# Patient Record
Sex: Female | Born: 1935 | Race: White | Hispanic: No | Marital: Married | Smoking: Never smoker
Health system: Southern US, Community
[De-identification: ages and names within clinical notes are randomized; demographics above are authoritative.]

## PROBLEM LIST (undated history)

## (undated) DIAGNOSIS — C50919 Malignant neoplasm of unspecified site of unspecified female breast: Secondary | ICD-10-CM

## (undated) DIAGNOSIS — M1990 Unspecified osteoarthritis, unspecified site: Secondary | ICD-10-CM

## (undated) DIAGNOSIS — C4490 Unspecified malignant neoplasm of skin, unspecified: Secondary | ICD-10-CM

## (undated) DIAGNOSIS — L570 Actinic keratosis: Secondary | ICD-10-CM

## (undated) DIAGNOSIS — I10 Essential (primary) hypertension: Secondary | ICD-10-CM

## (undated) DIAGNOSIS — S12000A Unspecified displaced fracture of first cervical vertebra, initial encounter for closed fracture: Secondary | ICD-10-CM

## (undated) HISTORY — PX: BACK SURGERY: SHX140

## (undated) HISTORY — DX: Actinic keratosis: L57.0

## (undated) HISTORY — PX: MOHS SURGERY: SUR867

---

## 2001-11-30 ENCOUNTER — Ambulatory Visit (HOSPITAL_COMMUNITY): Admission: RE | Admit: 2001-11-30 | Discharge: 2001-11-30 | Payer: Self-pay | Admitting: Gastroenterology

## 2004-10-29 ENCOUNTER — Ambulatory Visit: Payer: Self-pay | Admitting: Specialist

## 2004-11-06 ENCOUNTER — Ambulatory Visit (HOSPITAL_COMMUNITY): Admission: RE | Admit: 2004-11-06 | Discharge: 2004-11-06 | Payer: Self-pay | Admitting: Neurosurgery

## 2006-07-04 ENCOUNTER — Other Ambulatory Visit: Payer: Self-pay

## 2006-07-04 ENCOUNTER — Emergency Department: Payer: Self-pay | Admitting: Emergency Medicine

## 2006-08-30 ENCOUNTER — Emergency Department: Payer: Self-pay | Admitting: Emergency Medicine

## 2014-12-15 ENCOUNTER — Emergency Department
Admission: EM | Admit: 2014-12-15 | Discharge: 2014-12-15 | Disposition: A | Discharge status: Home / Self Care | Payer: Medicare Other | Attending: Emergency Medicine | Admitting: Emergency Medicine

## 2014-12-15 ENCOUNTER — Other Ambulatory Visit: Payer: Self-pay

## 2014-12-15 ENCOUNTER — Encounter: Payer: Self-pay | Admitting: Emergency Medicine

## 2014-12-15 ENCOUNTER — Emergency Department: Payer: Medicare Other

## 2014-12-15 ENCOUNTER — Emergency Department
Admission: EM | Admit: 2014-12-15 | Discharge: 2014-12-16 | Disposition: A | Discharge status: Home / Self Care | Payer: Medicare Other | Source: Home / Self Care | Attending: Emergency Medicine | Admitting: Emergency Medicine

## 2014-12-15 DIAGNOSIS — F0781 Postconcussional syndrome: Secondary | ICD-10-CM | POA: Diagnosis not present

## 2014-12-15 DIAGNOSIS — R4182 Altered mental status, unspecified: Secondary | ICD-10-CM | POA: Insufficient documentation

## 2014-12-15 DIAGNOSIS — I10 Essential (primary) hypertension: Secondary | ICD-10-CM | POA: Insufficient documentation

## 2014-12-15 DIAGNOSIS — Y9289 Other specified places as the place of occurrence of the external cause: Secondary | ICD-10-CM | POA: Insufficient documentation

## 2014-12-15 DIAGNOSIS — W01198A Fall on same level from slipping, tripping and stumbling with subsequent striking against other object, initial encounter: Secondary | ICD-10-CM | POA: Insufficient documentation

## 2014-12-15 DIAGNOSIS — G44309 Post-traumatic headache, unspecified, not intractable: Secondary | ICD-10-CM | POA: Insufficient documentation

## 2014-12-15 DIAGNOSIS — Y9301 Activity, walking, marching and hiking: Secondary | ICD-10-CM | POA: Insufficient documentation

## 2014-12-15 DIAGNOSIS — S0181XA Laceration without foreign body of other part of head, initial encounter: Secondary | ICD-10-CM | POA: Diagnosis present

## 2014-12-15 DIAGNOSIS — R413 Other amnesia: Secondary | ICD-10-CM

## 2014-12-15 DIAGNOSIS — Y998 Other external cause status: Secondary | ICD-10-CM | POA: Diagnosis not present

## 2014-12-15 DIAGNOSIS — N390 Urinary tract infection, site not specified: Secondary | ICD-10-CM | POA: Diagnosis not present

## 2014-12-15 HISTORY — DX: Unspecified malignant neoplasm of skin, unspecified: C44.90

## 2014-12-15 HISTORY — DX: Essential (primary) hypertension: I10

## 2014-12-15 LAB — CBC WITH DIFFERENTIAL/PLATELET
Basophils Absolute: 0.1 10*3/uL (ref 0–0.1)
Basophils Relative: 1 %
EOS ABS: 0.1 10*3/uL (ref 0–0.7)
Eosinophils Relative: 1 %
HEMATOCRIT: 44.9 % (ref 35.0–47.0)
HEMOGLOBIN: 15 g/dL (ref 12.0–16.0)
LYMPHS ABS: 3.5 10*3/uL (ref 1.0–3.6)
Lymphocytes Relative: 36 %
MCH: 29.5 pg (ref 26.0–34.0)
MCHC: 33.4 g/dL (ref 32.0–36.0)
MCV: 88.3 fL (ref 80.0–100.0)
MONO ABS: 0.7 10*3/uL (ref 0.2–0.9)
MONOS PCT: 8 %
NEUTROS PCT: 54 %
Neutro Abs: 5.4 10*3/uL (ref 1.4–6.5)
Platelets: 250 10*3/uL (ref 150–440)
RBC: 5.08 MIL/uL (ref 3.80–5.20)
RDW: 13.1 % (ref 11.5–14.5)
WBC: 9.8 10*3/uL (ref 3.6–11.0)

## 2014-12-15 LAB — COMPREHENSIVE METABOLIC PANEL
ALK PHOS: 61 U/L (ref 38–126)
ALT: 17 U/L (ref 14–54)
ANION GAP: 9 (ref 5–15)
AST: 27 U/L (ref 15–41)
Albumin: 4.5 g/dL (ref 3.5–5.0)
BILIRUBIN TOTAL: 1 mg/dL (ref 0.3–1.2)
BUN: 24 mg/dL — ABNORMAL HIGH (ref 6–20)
CALCIUM: 10 mg/dL (ref 8.9–10.3)
CO2: 32 mmol/L (ref 22–32)
Chloride: 99 mmol/L — ABNORMAL LOW (ref 101–111)
Creatinine, Ser: 0.56 mg/dL (ref 0.44–1.00)
GFR calc non Af Amer: >60 mL/min (ref >60)
Glucose, Bld: 109 mg/dL — ABNORMAL HIGH (ref 65–99)
POTASSIUM: 3.2 mmol/L — AB (ref 3.5–5.1)
SODIUM: 140 mmol/L (ref 135–145)
TOTAL PROTEIN: 7.7 g/dL (ref 6.5–8.1)

## 2014-12-15 LAB — GLUCOSE, CAPILLARY: GLUCOSE-CAPILLARY: 103 mg/dL — AB (ref 65–99)

## 2014-12-15 MED ORDER — LIDOCAINE-PRILOCAINE 2.5-2.5 % EX CREA
TOPICAL_CREAM | Freq: Once | CUTANEOUS | Status: AC
  Administered 2014-12-15: 19:00:00 via TOPICAL
  Filled 2014-12-15 (×2): qty 5

## 2014-12-15 MED ORDER — BACITRACIN ZINC 500 UNIT/GM EX OINT
TOPICAL_OINTMENT | CUTANEOUS | Status: AC
  Administered 2014-12-15: 1 via TOPICAL
  Filled 2014-12-15: qty 0.9

## 2014-12-15 MED ORDER — ACETAMINOPHEN 325 MG PO TABS
650.0000 mg | ORAL_TABLET | Freq: Once | ORAL | Status: AC
Start: 2014-12-16 — End: 2014-12-16
  Administered 2014-12-16: 650 mg via ORAL

## 2014-12-15 MED ORDER — LORAZEPAM 0.5 MG PO TABS
ORAL_TABLET | ORAL | Status: AC
  Administered 2014-12-16: 0.5 mg via ORAL
  Filled 2014-12-15: qty 1

## 2014-12-15 MED ORDER — LORAZEPAM 0.5 MG PO TABS
0.5000 mg | ORAL_TABLET | Freq: Once | ORAL | Status: AC
  Administered 2014-12-16: 0.5 mg via ORAL

## 2014-12-15 MED ORDER — BACITRACIN ZINC 500 UNIT/GM EX OINT
TOPICAL_OINTMENT | Freq: Every day | CUTANEOUS | Status: DC
  Administered 2014-12-15: 1 via TOPICAL

## 2014-12-15 MED ORDER — ACETAMINOPHEN 325 MG PO TABS
ORAL_TABLET | ORAL | Status: AC
  Administered 2014-12-16: 650 mg via ORAL
  Filled 2014-12-15: qty 2

## 2014-12-15 NOTE — ED Notes (Signed)
Pt returned from CT °

## 2014-12-15 NOTE — ED Provider Notes (Signed)
[facility] Emergency Department Provider Note   ____________________________________________  Time seen: Upon arrival to ED room I have reviewed the triage vital signs and the triage nursing note.  HISTORY  Chief Complaint Fall and Facial Laceration   Historian Patient and spouse  HPI Sherry [name] is a 81 y.o. female who is here for evaluation of facial laceration. Patient states she tripped and her head knocked into uneven brick wall. No altered mental status or loss of consciousness. She is not on any blood thinners. She did have her friend and ENT physician evaluate her laceration, and it was recommended to her to come to the ED for stitches.    Past Medical History  Diagnosis Date  . Skin cancer   . Hypertension     There are no active problems to display for this patient.   Past Surgical History  Procedure Laterality Date  . Back surgery    . Mohs surgery      No current outpatient prescriptions on file.  Allergies Morphine and related  History reviewed. No pertinent family history.  Social History Social History  Substance Use Topics  . Smoking status: Never Smoker   . Smokeless tobacco: None  . Alcohol Use: Yes     Comment: Occassionally    Review of Systems  Constitutional: Negative for fever. Eyes: Negative for visual changes. ENT: Negative for sore throat. Cardiovascular: Negative for chest pain. Respiratory: Negative for shortness of breath. Gastrointestinal: Negative for abdominal pain, vomiting and diarrhea. Genitourinary: Negative for dysuria. Musculoskeletal: Negative for back pain. Skin: Negative for rash. Neurological: Negative for headache. 10 point Review of Systems otherwise negative ____________________________________________   PHYSICAL EXAM:  VITAL SIGNS: ED Triage Vitals  Enc Vitals Group     BP --      Pulse --      Resp --      Temp --      Temp src --      SpO2 --      Weight --     Height --      Head Cir --      Peak Flow --      Pain Score --      Pain Loc --      Pain Edu? --      Excl. in [place]? --      Constitutional: Alert and oriented. Well appearing and in no distress. Eyes: Conjunctivae are normal. PERRL. Normal extraocular movements. ENT   Head: Normocephalic.  Healing stitches that right face from recent Mohs surgery.  Small ulceration wound at the top of the scalp.  Small hematoma right forehead, acute. Laceration above the right eyebrow, hemostatic.   Nose: No congestion/rhinnorhea.   Mouth/Throat: Mucous membranes are moist.   Neck: No stridor. Cardiovascular/Chest: Normal rate, regular rhythm.  No murmurs, rubs, or gallops. Respiratory: Normal respiratory effort without tachypnea nor retractions. Breath sounds are clear and equal bilaterally. No wheezes/rales/rhonchi. Gastrointestinal: Soft. No distention, no guarding, no rebound. Nontender  Genitourinary/rectal:Deferred Musculoskeletal: Nontender with normal range of motion in all extremities. No joint effusions.  No lower extremity tenderness.  No edema. Neurologic:  Normal speech and language. No gross or focal neurologic deficits are appreciated. Skin:  Skin is warm, dry and intact. No rash noted. Psychiatric: Mood and affect are normal. Speech and behavior are normal. Patient exhibits appropriate insight and judgment.  ____________________________________________   EKG I, Lisa Roca, MD, the attending physician have personally viewed and interpreted all  ECGs.  No EKG performed ____________________________________________  LABS (pertinent positives/negatives)  none  ____________________________________________  RADIOLOGY All Xrays were viewed by me. Imaging interpreted by Radiologist.  none __________________________________________  PROCEDURES  Procedure(s) performed: LACERATION REPAIR Performed by: Lisa Roca Authorized by: Lisa Roca Consent: Verbal  consent obtained. Risks and benefits: risks, benefits and alternatives were discussed Consent given by: patient Patient identity confirmed: provided demographic data Prepped and Draped in normal sterile fashion Wound explored  Laceration Location: Right forehead  Laceration Length: 4 cm  No Foreign Bodies seen or palpated   Local anesthetic: EMLA cream   Irrigation method: syringe Amount of cleaning: standard  Skin closure: sutures6-0 prolene  Number of sutures: 4  Technique: interrupted  Patient tolerance: Patient tolerated the procedure well with no immediate complications.   Critical Care performed: None  ____________________________________________   ED COURSE / ASSESSMENT AND PLAN  CONSULTATIONS: None  Pertinent labs & imaging results that were available during my care of the patient were reviewed by me and considered in my medical decision making (see chart for details).  Patient's very superficial laceration was closed with stitches.  Patient did not pass out or lose consciousness, has an intact neurologic exam, however given her age, I discussed with her obtaining a head CT to rule out intracranial injury. Head CT was negative for renal injury.  Patient referred to primary care physician for stitches removal in 3-5 days. She has an appointment already for stitches removal from her mohs surgery on Thursday.   Patient / Family / Caregiver informed of clinical course, medical decision-making process, and agree with plan.   I discussed return precautions, follow-up instructions, and discharged instructions with patient and/or family.  ___________________________________________   FINAL CLINICAL IMPRESSION(S) / ED DIAGNOSES   Final diagnoses:  Facial laceration, initial encounter       Lisa Roca, MD 12/15/14 2039

## 2014-12-15 NOTE — ED Notes (Signed)
Pt at this time remembers ct technician and having ct scan done earlier today. fsbs 103. Dr. Dahlia Client in to see pt.

## 2014-12-15 NOTE — ED Notes (Signed)
Pt to room 8, primary nurse david in to see pt. Dr. Clearnce Hasten notified of pt's husband's request for "tylenol". Head ct ordered per dr. Clearnce Hasten.

## 2014-12-15 NOTE — ED Notes (Addendum)
Pt;'s husband states pt with head injury today, striked head on brick steps. Pt's husband states pt does not remember being in emergency department earlier today. Pt is alert to self, place and year only at this time. Pt with perrl 18mm brisk. Pt moving all extremities without difficulty. Smile symmetrical, complains of headache at this time, dressing in place to top of head and forehead.

## 2014-12-15 NOTE — ED Notes (Signed)
Pt states she was walking and tripped over bricks and fell and hit her head at approx 5:30. Pt denies LOC, pt is alert and oriented in triage. Pt has approx 1.5 in laceration above her R eyebrow. Pt denies being on blood thinners at this time.

## 2014-12-15 NOTE — ED Notes (Signed)
Patient transported to CT 

## 2014-12-15 NOTE — ED Notes (Signed)
AAOx3.  Skin warm and dry.  NAD.  Ambulates with easy and steady gait.  D/C home 

## 2014-12-15 NOTE — Discharge Instructions (Signed)
You were evaluated after head injury with facial laceration which was repaired in the emergency department.  Return to the emergency department for any worsening condition including confusion, altered mental status, weakness, numbness, or any signs of infection which would include worsening pain, drainage, or redness to the wound.  Stitches need to be removed in 3-5 days. I recommend following up with your primary care physician for stitches removal.  Facial Laceration  A facial laceration is a cut on the face. These injuries can be painful and cause bleeding. Lacerations usually heal quickly, but they need special care to reduce scarring. DIAGNOSIS  Your health care provider will take a medical history, ask for details about how the injury occurred, and examine the wound to determine how deep the cut is. TREATMENT  Some facial lacerations may not require closure. Others may not be able to be closed because of an increased risk of infection. The risk of infection and the chance for successful closure will depend on various factors, including the amount of time since the injury occurred. The wound may be cleaned to help prevent infection. If closure is appropriate, pain medicines may be given if needed. Your health care provider will use stitches (sutures), wound glue (adhesive), or skin adhesive strips to repair the laceration. These tools bring the skin edges together to allow for faster healing and a better cosmetic outcome. If needed, you may also be given a tetanus shot. HOME CARE INSTRUCTIONS  Only take over-the-counter or prescription medicines as directed by your health care provider.  Follow your health care provider's instructions for wound care. These instructions will vary depending on the technique used for closing the wound. For Sutures:  Keep the wound clean and dry.   If you were given a bandage (dressing), you should change it at least once a day. Also change the dressing if it  becomes wet or dirty, or as directed by your health care provider.   Wash the wound with soap and water 2 times a day. Rinse the wound off with water to remove all soap. Pat the wound dry with a clean towel.   After cleaning, apply a thin layer of the antibiotic ointment recommended by your health care provider. This will help prevent infection and keep the dressing from sticking.   You may shower as usual after the first 24 hours. Do not soak the wound in water until the sutures are removed.   Get your sutures removed as directed by your health care provider. With facial lacerations, sutures should usually be taken out after 4-5 days to avoid stitch marks.   Wait a few days after your sutures are removed before applying any makeup. For Skin Adhesive Strips:  Keep the wound clean and dry.   Do not get the skin adhesive strips wet. You may bathe carefully, using caution to keep the wound dry.   If the wound gets wet, pat it dry with a clean towel.   Skin adhesive strips will fall off on their own. You may trim the strips as the wound heals. Do not remove skin adhesive strips that are still stuck to the wound. They will fall off in time.  For Wound Adhesive:  You may briefly wet your wound in the shower or bath. Do not soak or scrub the wound. Do not swim. Avoid periods of heavy sweating until the skin adhesive has fallen off on its own. After showering or bathing, gently pat the wound dry with a clean towel.  Do not apply liquid medicine, cream medicine, ointment medicine, or makeup to your wound while the skin adhesive is in place. This may loosen the film before your wound is healed.   If a dressing is placed over the wound, be careful not to apply tape directly over the skin adhesive. This may cause the adhesive to be pulled off before the wound is healed.   Avoid prolonged exposure to sunlight or tanning lamps while the skin adhesive is in place.  The skin adhesive will  usually remain in place for 5-10 days, then naturally fall off the skin. Do not pick at the adhesive film.  After Healing: Once the wound has healed, cover the wound with sunscreen during the day for 1 full year. This can help minimize scarring. Exposure to ultraviolet light in the first year will darken the scar. It can take 1-2 years for the scar to lose its redness and to heal completely.  SEEK MEDICAL CARE IF:  You have a fever. SEEK IMMEDIATE MEDICAL CARE IF:  You have redness, pain, or swelling around the wound.   You see ayellowish-white fluid (pus) coming from the wound.    This information is not intended to replace advice given to you by your health care provider. Make sure you discuss any questions you have with your health care provider.   Document Released: 03/25/2004 Document Revised: 03/08/2014 Document Reviewed: 09/28/2012 Elsevier Interactive Patient Education 2016 [place] Injury, Adult You have a head injury. Headaches and throwing up (vomiting) are common after a head injury. It should be easy to wake up from sleeping. Sometimes you must stay in the hospital. Most problems happen within the first 24 hours. Side effects may occur up to 7-10 days after the injury.  WHAT ARE THE TYPES OF HEAD INJURIES? Head injuries can be as minor as a bump. Some head injuries can be more severe. More severe head injuries include:  A jarring injury to the brain (concussion).  A bruise of the brain (contusion). This mean there is bleeding in the brain that can cause swelling.  A cracked skull (skull fracture).  Bleeding in the brain that collects, clots, and forms a bump (hematoma). WHEN SHOULD I GET HELP RIGHT AWAY?   You are confused or sleepy.  You cannot be woken up.  You feel sick to your stomach (nauseous) or keep throwing up (vomiting).  Your dizziness or unsteadiness is getting worse.  You have very bad, lasting headaches that are not helped by medicine.  Take medicines only as told by your doctor.  You cannot use your arms or legs like normal.  You cannot walk.  You notice changes in the black spots in the center of the colored part of your eye (pupil).  You have clear or bloody fluid coming from your nose or ears.  You have trouble seeing. During the next 24 hours after the injury, you must stay with someone who can watch you. This person should get help right away (call 911 in the [place] if you start to shake and are not able to control it (have seizures), you pass out, or you are unable to wake up. HOW CAN I PREVENT A HEAD INJURY IN THE FUTURE?  Wear seat belts.  Wear a helmet while bike riding and playing sports like football.  Stay away from dangerous activities around the house. WHEN CAN I RETURN TO NORMAL ACTIVITIES AND ATHLETICS? See your doctor before doing these activities. You should not do  normal activities or play contact sports until 1 week after the following symptoms have stopped:  Headache that does not go away.  Dizziness.  Poor attention.  Confusion.  Memory problems.  Sickness to your stomach or throwing up.  Tiredness.  Fussiness.  Bothered by bright lights or loud noises.  Anxiousness or depression.  Restless sleep. MAKE SURE YOU:   Understand these instructions.  Will watch your condition.  Will get help right away if you are not doing well or get worse.   This information is not intended to replace advice given to you by your health care provider. Make sure you discuss any questions you have with your health care provider.   Document Released: 01/29/2008 Document Revised: 03/08/2014 Document Reviewed: 10/23/2012 Elsevier Interactive Patient Education [employer].

## 2014-12-16 DIAGNOSIS — S0181XA Laceration without foreign body of other part of head, initial encounter: Secondary | ICD-10-CM | POA: Diagnosis not present

## 2014-12-16 LAB — URINALYSIS COMPLETE WITH MICROSCOPIC (ARMC ONLY)
BILIRUBIN URINE: NEGATIVE
GLUCOSE, UA: NEGATIVE mg/dL
Hgb urine dipstick: NEGATIVE
LEUKOCYTES UA: NEGATIVE
NITRITE: NEGATIVE
PH: 6 (ref 5.0–8.0)
Protein, ur: NEGATIVE mg/dL
Specific Gravity, Urine: 1.01 (ref 1.005–1.030)

## 2014-12-16 MED ORDER — CEPHALEXIN 500 MG PO CAPS: 500.0000 mg | ORAL_CAPSULE | Freq: Four times a day (QID) | ORAL | Status: AC

## 2014-12-16 MED ORDER — ONDANSETRON 4 MG PO TBDP: 4.0000 mg | ORAL_TABLET | Freq: Three times a day (TID) | ORAL | Status: DC | PRN

## 2014-12-16 MED ORDER — CEPHALEXIN 500 MG PO CAPS
500.0000 mg | ORAL_CAPSULE | Freq: Once | ORAL | Status: DC
  Filled 2014-12-16: qty 1

## 2014-12-16 NOTE — Discharge Instructions (Signed)
Post-Concussion Syndrome  Post-concussion syndrome describes the symptoms that can occur after a head injury. These symptoms can last from weeks to months.  CAUSES   It is not clear why some head injuries cause post-concussion syndrome. It can occur whether your head injury was mild or severe and whether you were wearing head protection or not.   SIGNS AND SYMPTOMS  · Memory difficulties.  · Dizziness.  · Headaches.  · Double vision or blurry vision.  · Sensitivity to light.  · Hearing difficulties.  · Depression.  · Tiredness.  · Weakness.  · Difficulty with concentration.  · Difficulty sleeping or staying asleep.  · Vomiting.  · Poor balance or instability on your feet.  · Slow reaction time.  · Difficulty learning and remembering things you have heard.  DIAGNOSIS   There is no test to determine whether you have post-concussion syndrome. Your health care provider may order an imaging scan of your brain, such as a CT scan, to check for other problems that may be causing your symptoms (such as a severe injury inside your skull).  TREATMENT   Usually, these problems disappear over time without medical care. Your health care provider may prescribe medicine to help ease your symptoms. It is important to follow up with a neurologist to evaluate your recovery and address any lingering symptoms or issues.  HOME CARE INSTRUCTIONS   · Take medicines only as directed by your health care provider. Do not take aspirin. Aspirin can slow blood clotting.  · Sleep with your head slightly elevated to help with headaches.  · Avoid any situation where there is potential for another head injury. This includes football, hockey, soccer, basketball, martial arts, downhill snow sports, and horseback riding. Your condition will get worse every time you experience a concussion. You should avoid these activities until you are evaluated by the appropriate follow-up health care providers.  · Keep all follow-up visits as directed by your health  care provider. This is important.  SEEK MEDICAL CARE IF:  · You have increased problems paying attention or concentrating.  · You have increased difficulty remembering or learning new information.  · You need more time to complete tasks or assignments than before.  · You have increased irritability or decreased ability to cope with stress.  · You have more symptoms than before.  Seek medical care if you have any of the following symptoms for more than two weeks after your injury:  · Lasting (chronic) headaches.  · Dizziness or balance problems.  · Nausea.  · Vision problems.  · Increased sensitivity to noise or light.  · Depression or mood swings.  · Anxiety or irritability.  · Memory problems.  · Difficulty concentrating or paying attention.  · Sleep problems.  · Feeling tired all the time.  SEEK IMMEDIATE MEDICAL CARE IF:  · You have confusion or unusual drowsiness.  · Others find it difficult to wake you up.  · You have nausea or persistent, forceful vomiting.  · You feel like you are moving when you are not (vertigo). Your eyes may move rapidly back and forth.  · You have convulsions or faint.  · You have severe, persistent headaches that are not relieved by medicine.  · You cannot use your arms or legs normally.  · One of your pupils is larger than the other.  · You have clear or bloody discharge from your nose or ears.  · Your problems are getting worse, not better.  MAKE   SURE YOU:  · Understand these instructions.  · Will watch your condition.  · Will get help right away if you are not doing well or get worse.     This information is not intended to replace advice given to you by your health care provider. Make sure you discuss any questions you have with your health care provider.     Document Released: 08/07/2001 Document Revised: 03/08/2014 Document Reviewed: 05/23/2013  Elsevier Interactive Patient Education ©2016 [employer]

## 2014-12-16 NOTE — ED Provider Notes (Signed)
[facility] Emergency Department Provider Note  ____________________________________________  Time seen: Approximately 2304 PM  I have reviewed the triage vital signs and the nursing notes.   HISTORY  Chief Complaint Head Injury and Altered Mental Status    HPI Sherry [name] is a 81 y.o. female who fell this afternoon. The patient was seen in the emergency department and treated for laceration and head injury. The patient received 4 stitches over her right eye, a CT scan and was discharged to home. The patient's husband reports that when she arrived home she could not remember anything that happened. The patient did not remember anything that had happened during the day or even in the past week. The husband reports that she also seemed to be confused as to the date. He reports that she can remember him and the different members of their family but could not remember any of the events that occurred so he decided to bring her back in for evaluation. He reports that he did not know the results of the CT scan and was concerned due to his wife's mental state. He denies any slurred speech or weakness he reports that she was walking with a steady gait and not stumbling. The patient's husband reports that she typically gets around without any difficulty and he was concerned due to this change in her status. The patient did not have any loss of consciousness during her injury and was seen and evaluated previously. She reports that since the head injury she has felt nauseous, has had a headache and has had some mild dizziness. The patient rates her pain as a 5 out of 10 in intensity.   Past Medical History  Diagnosis Date  . Skin cancer   . Hypertension     There are no active problems to display for this patient.   Past Surgical History  Procedure Laterality Date  . Back surgery    . Mohs surgery      Current Outpatient Rx  Name  Route  Sig  Dispense  Refill  .  cephALEXin (KEFLEX) 500 MG capsule   Oral   Take 1 capsule (500 mg total) by mouth 4 (four) times daily.   20 capsule   0   . ondansetron (ZOFRAN ODT) 4 MG disintegrating tablet   Oral   Take 1 tablet (4 mg total) by mouth every 8 (eight) hours as needed for nausea or vomiting.   20 tablet   0     Allergies Morphine and related  No family history on file.  Social History Social History  Substance Use Topics  . Smoking status: Never Smoker   . Smokeless tobacco: Not on file  . Alcohol Use: Yes     Comment: Occassionally    Review of Systems Constitutional: No fever/chills Eyes: No visual changes. ENT: No sore throat. Cardiovascular: Denies chest pain. Respiratory: Denies shortness of breath. Gastrointestinal: Nausea with No abdominal pain.  no vomiting.  No diarrhea.  No constipation. Genitourinary: Negative for dysuria. Musculoskeletal: Negative for back pain. Skin: Laceration to right forehead. Neurological: Headache and dizziness  10-point ROS otherwise negative.  ____________________________________________   PHYSICAL EXAM:  VITAL SIGNS: ED Triage Vitals  Enc Vitals Group     BP 12/15/14 2246 188/76 mmHg     Pulse Rate 12/15/14 2246 72     Resp 12/15/14 2246 18     Temp 12/15/14 2246 98 F (36.7 C)     Temp Source 12/15/14 2246 Oral  SpO2 12/15/14 2246 100 %     Weight 12/15/14 2246 140 lb (63.504 kg)     Height 12/15/14 2246 5\' 5"  (1.651 m)     Head Cir --      Peak Flow --      Pain Score --      Pain Loc --      Pain Edu? --      Excl. in [place]? --     Constitutional: Alert and oriented to self, date and location. Well appearing and in no acute distress. Eyes: Conjunctivae are normal. PERRL. EOMI. Head: Atraumatic. Nose: No congestion/rhinnorhea. Mouth/Throat: Mucous membranes are moist.  Oropharynx non-erythematous. Cardiovascular: Normal rate, regular rhythm. Grossly normal heart sounds.  Good peripheral circulation. Respiratory: Normal  respiratory effort.  No retractions. Lungs CTAB. Gastrointestinal: Soft and nontender. No distention. Positive bowel sounds Musculoskeletal: No lower extremity tenderness nor edema.  Neurologic:  Normal speech and language. No gross focal neurologic deficits are appreciated. Cranial nerves II-XII intact, no pronator drift, sensation intact throughout Skin:  Skin is warm, dry and intact.  Psychiatric: Mood and affect are normal. Speech and behavior are normal.  ____________________________________________   LABS (all labs ordered are listed, but only abnormal results are displayed)  Labs Reviewed  COMPREHENSIVE METABOLIC PANEL - Abnormal; Notable for the following:    Potassium 3.2 (*)    Chloride 99 (*)    Glucose, Bld 109 (*)    BUN 24 (*)    All other components within normal limits  GLUCOSE, CAPILLARY - Abnormal; Notable for the following:    Glucose-Capillary 103 (*)    All other components within normal limits  URINALYSIS COMPLETEWITH MICROSCOPIC ([facility] ONLY) - Abnormal; Notable for the following:    Color, Urine YELLOW (*)    APPearance CLEAR (*)    Ketones, ur TRACE (*)    Bacteria, UA MANY (*)    Squamous Epithelial / LPF 0-5 (*)    All other components within normal limits  URINE CULTURE  CBC WITH DIFFERENTIAL/PLATELET   ____________________________________________  EKG  ED ECG REPORT I, Loney Hering, the attending physician, personally viewed and interpreted this ECG.   Date: 12/15/2014  EKG Time: 2332  Rate: 69  Rhythm: normal sinus rhythm  Axis: normal  Intervals:left anterior fascicular block  ST&T Change: none  ____________________________________________  RADIOLOGY  CT head: No new intracranial process, Stable scalp laceration near the vertex with small punctate focus of retained debris, atrophy with chronic microvascular ischemic disease, stable ____________________________________________   PROCEDURES  Procedure(s) performed:  None  Critical Care performed: No  ____________________________________________   INITIAL IMPRESSION / ASSESSMENT AND PLAN / ED COURSE  Pertinent labs & imaging results that were available during my care of the patient were reviewed by me and considered in my medical decision making (see chart for details).  This is an 81 year old female who comes in today with some amnesia after head injury. The patient did receive a repeat of her CT scan to ensure that she did not have a developing or rapidly progressing intracranial hemorrhage. I will also evaluate the patient's urinalysis to evaluate for possible infection. I will give the patient dose of Tylenol and Ativan 0.5 mg orally times one dose to help with her dizziness and headache. I will also have the patient drinks to determine if she is able to take by mouth without any emesis. The patient will be reevaluated once her blood work results have resulted.  The patient does appear to  have many bacteria in her urine. I'll give the patient a dose of Keflex and treat her for Keflex. This may also be attributed to the patient's amnestic episodes. By the time I returned to the patient's room her husband reports she is 1000 times better. The patient does remember the incident but does not room number coming back to the hospitalist second time. We again had a conversation about concussion and postconcussive syndrome. I informed the patient's husband to have her follow-up with her primary care physician for further evaluation and treatment of her amnestic post concussive symptoms. ____________________________________________   FINAL CLINICAL IMPRESSION(S) / ED DIAGNOSES  Final diagnoses:  Post traumatic amnesia  Post concussive syndrome  UTI (lower urinary tract infection)      Loney Hering, MD 12/16/14 [phone]

## 2014-12-18 LAB — URINE CULTURE
Culture: >=100000
Special Requests: NORMAL

## 2015-02-25 ENCOUNTER — Emergency Department
Admission: EM | Admit: 2015-02-25 | Discharge: 2015-02-25 | Discharge status: Against Medical Advice | Payer: Medicare Other | Attending: Emergency Medicine | Admitting: Emergency Medicine

## 2015-02-25 DIAGNOSIS — T23202A Burn of second degree of left hand, unspecified site, initial encounter: Secondary | ICD-10-CM | POA: Diagnosis present

## 2015-02-25 DIAGNOSIS — Y9389 Activity, other specified: Secondary | ICD-10-CM | POA: Diagnosis not present

## 2015-02-25 DIAGNOSIS — Y9289 Other specified places as the place of occurrence of the external cause: Secondary | ICD-10-CM | POA: Insufficient documentation

## 2015-02-25 DIAGNOSIS — Y998 Other external cause status: Secondary | ICD-10-CM | POA: Diagnosis not present

## 2015-02-25 DIAGNOSIS — T23272A Burn of second degree of left wrist, initial encounter: Secondary | ICD-10-CM | POA: Insufficient documentation

## 2015-02-25 DIAGNOSIS — I10 Essential (primary) hypertension: Secondary | ICD-10-CM | POA: Insufficient documentation

## 2015-02-25 DIAGNOSIS — Y278XXA Contact with other hot objects, undetermined intent, initial encounter: Secondary | ICD-10-CM | POA: Diagnosis not present
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
## 2016-06-30 DIAGNOSIS — I10 Essential (primary) hypertension: Secondary | ICD-10-CM | POA: Diagnosis present

## 2016-09-21 ENCOUNTER — Telehealth: Payer: Self-pay

## 2016-09-23 NOTE — Progress Notes (Signed)
Cardiology Office Note  Date:  09/25/2016   ID:  Virjean, Boman 1930-05-11, MRN [id number]  PCP:  Rusty Aus, MD   Chief Complaint  Patient presents with  . other    New patient. Patient c/o Elevated blood pressure. Meds reviewed verbally with patient.     HPI:  [name] is a pleasant 83 year old woman with past medical history of Hypertension Hyperlipidemia with statin intolerance Palpitations Neuropathy in the feet Who presents for new patient evaluation, discussion of her hypertension and coronary risk factors  She is concerned about elevated blood pressure, She has a blood pressure cuff at home and typically is 150 up to 409W systolic She does not feel lab a pressure numbers through primary care are accurate and are not reflecting what she is getting at home She feels that she needs to be more aggressive with her blood pressure control/management She does report being sensitive to medications She is currently on a thiazide diuretic, for the past several years with potassium Denies having any lower extremity edema  Lab work reviewed with her from April 2018, 2017 and 16 Total cholesterol typically 250 Statin intolerance  Reports that she had previous carotid ultrasound that showed no significant stenosis Records not available in care everywhere  Other complaint is that she has rare episodes of palpitations lasting for one second, like a flip flopping extra beat, symptoms are rare. Do not seem to bother her very much  Other big complaint is numbness on the bottom of her feet Bothers her at nighttime when she is trying to go to sleep. Does not feel at she needs medications at this time  EKG personally reviewed by myself on todays visit Shows normal sinus rhythm with rate 70 bpm left axis deviation, no significant ST or T-wave changes  Family history discussed with her Mom with hyperlipidemia, cancer Mothers brother with MI Dad HTN   PMH:   has a past  medical history of Hypertension and Skin cancer.  PSH:    Past Surgical History:  Procedure Laterality Date  . BACK SURGERY    . MOHS SURGERY      Current Outpatient Prescriptions  Medication Sig Dispense Refill  . celecoxib (CELEBREX) 200 MG capsule Take 200 mg by mouth daily.    . hydrocortisone 2.5 % cream Apply to rash under breast for severe flare twice daily for 1-2 weeks then take 1-2 weeks off and repeat if needed    . PREMARIN 0.3 MG tablet Take 0.3 mg by mouth daily.    Marland Kitchen losartan (COZAAR) 100 MG tablet Take 1 tablet (100 mg total) by mouth daily. 30 tablet 11   No current facility-administered medications for this visit.      Allergies:   Morphine and related   Social History:  The patient  reports that she has never smoked. She has never used smokeless tobacco. She reports that she drinks alcohol. She reports that she does not use drugs.   Family History:   family history includes Hypertension in her father.    Review of Systems: Review of Systems  Constitutional: Negative.   Respiratory: Negative.   Cardiovascular: Positive for palpitations.  Gastrointestinal: Negative.   Musculoskeletal: Negative.   Neurological: Negative.        Numbness on bottom of feet  Psychiatric/Behavioral: The patient is nervous/anxious.   All other systems reviewed and are negative.    PHYSICAL EXAM: VS:  BP (!) 164/74 (BP Location: Right Arm, Patient Position: Sitting, Cuff  Size: Normal)   Pulse 70   Ht 5\' 5"  (1.651 m)   Wt 145 lb 4 oz (65.9 kg)   BMI 24.17 kg/m  , BMI Body mass index is 24.17 kg/m. GEN: Well nourished, well developed, in no acute distress  HEENT: normal  Neck: no JVD, carotid bruits, or masses Cardiac: RRR; no murmurs, rubs, or gallops,no edema  Respiratory:  clear to auscultation bilaterally, normal work of breathing GI: soft, nontender, nondistended, + BS MS: no deformity or atrophy  Skin: warm and dry, no rash Neuro:  Strength and sensation are  intact Psych: euthymic mood, full affect    Recent Labs: No results found for requested labs within last 8760 hours.    Lipid Panel No results found for: CHOL, HDL, LDLCALC, TRIG  Results reviewed from care everywhere  Wt Readings from Last 3 Encounters:  09/24/16 145 lb 4 oz (65.9 kg)  02/25/15 139 lb (63 kg)  12/15/14 140 lb (63.5 kg)       ASSESSMENT AND PLAN:  Essential hypertension - Plan: EKG 12-Lead Long discussion with her concerning her blood pressure. She reports persistent systolic 798-921. She is requesting changes to her medication regiment.  Denies having lower extremity edema, wonders if she can come off the diuretic Suggested she hold the thiazide diuretic and potassium pill, take these as needed Suggested she start losartan 50 mg daily. After one week if blood pressure continues to run high we would increase up to 100 mg daily Recommended she call us with blood pressure numbers in the next several weeks  Hyperlipidemia, unspecified hyperlipidemia type - Plan: CT CARDIAC SCORING Long discussion concerning her elevated cholesterol She does not want a statin if possible given history of myalgias Discussed various risk stratification imaging studies She would like CT coronary calcium score given family history, hyperlipidemia. This will be ordered for her. We'll call her with the results  Neuropathy Numbness in the bottom of her feet likely from neuropathy No diabetes Recommended she try different shoes as she is wearing hard sandals most of the time  Statin intolerance As above, risk stratification study with CT coronary calcium scoring  Anxiety Reassured her that we are not finding any major issues Scanning as above  Palpitations Likely having APCs or PVCs, possibly exacerbated by stress If symptoms get worse could order a Holter monitor  Disposition:   F/U  as needed We will call her with the results of her CT coronary calcium scoring   Total  encounter time more than 45 minutes  Greater than 50% was spent in counseling and coordination of care with the patient    Orders Placed This Encounter  Procedures  . CT CARDIAC SCORING  . EKG 12-Lead     Signed, Esmond Plants, M.D., Ph.D. 09/25/2016  [place], [place]

## 2016-09-24 ENCOUNTER — Encounter: Payer: Self-pay | Admitting: Cardiovascular Disease

## 2016-09-24 ENCOUNTER — Ambulatory Visit (INDEPENDENT_AMBULATORY_CARE_PROVIDER_SITE_OTHER): Payer: Medicare Other | Admitting: Cardiovascular Disease

## 2016-09-24 VITALS — BP 164/74 | HR 70 | Ht 65.0 in | Wt 145.2 lb

## 2016-09-24 DIAGNOSIS — E785 Hyperlipidemia, unspecified: Secondary | ICD-10-CM | POA: Diagnosis not present

## 2016-09-24 DIAGNOSIS — Z789 Other specified health status: Secondary | ICD-10-CM | POA: Diagnosis not present

## 2016-09-24 DIAGNOSIS — G629 Polyneuropathy, unspecified: Secondary | ICD-10-CM | POA: Diagnosis not present

## 2016-09-24 DIAGNOSIS — I10 Essential (primary) hypertension: Secondary | ICD-10-CM

## 2016-09-24 DIAGNOSIS — F419 Anxiety disorder, unspecified: Secondary | ICD-10-CM | POA: Diagnosis not present

## 2016-09-24 DIAGNOSIS — R002 Palpitations: Secondary | ICD-10-CM | POA: Diagnosis not present

## 2016-09-24 MED ORDER — LOSARTAN POTASSIUM 100 MG PO TABS: 100.0000 mg | ORAL_TABLET | Freq: Every day | ORAL | 11 refills | Status: DC

## 2016-09-24 NOTE — Patient Instructions (Addendum)
Medication Instructions:   Please stop the diuretic and potassium (take as needed for ankle swelling)  Please start losartan 1/2 pill  Monitor blood pressure If it runs high, >150 Increase up to a full pill  Labwork:  No new labs needed  Testing/Procedures:  We will order a CT coronary calcium score, for family hx and hyperlipidemia $150  Scheduled for October 15, 2016 Please arrive at 09:45 AM to register  Location is at [facility] [address]  Call [phone] if you need to reschedule or need instructions.   Follow-Up: It was a pleasure seeing you in the office today. Please call us if you have new issues that need to be addressed before your next appt.  [phone]  Your physician wants you to follow-up in:  As needed Consider 6 month follow-up for blood pressure check Please call with blood pressure numbers  If you need a refill on your cardiac medications before your next appointment, please call your pharmacy.

## 2016-09-25 DIAGNOSIS — Z789 Other specified health status: Secondary | ICD-10-CM | POA: Insufficient documentation

## 2016-09-25 DIAGNOSIS — F419 Anxiety disorder, unspecified: Secondary | ICD-10-CM | POA: Insufficient documentation

## 2016-09-25 DIAGNOSIS — E785 Hyperlipidemia, unspecified: Secondary | ICD-10-CM | POA: Insufficient documentation

## 2016-09-25 DIAGNOSIS — G629 Polyneuropathy, unspecified: Secondary | ICD-10-CM | POA: Insufficient documentation

## 2016-09-25 DIAGNOSIS — R002 Palpitations: Secondary | ICD-10-CM | POA: Insufficient documentation

## 2016-10-15 ENCOUNTER — Ambulatory Visit (INDEPENDENT_AMBULATORY_CARE_PROVIDER_SITE_OTHER)
Admission: RE | Admit: 2016-10-15 | Discharge: 2016-10-15 | Disposition: A | Discharge status: Home / Self Care | Payer: Self-pay | Source: Ambulatory Visit | Attending: Cardiovascular Disease | Admitting: Cardiovascular Disease

## 2016-10-15 DIAGNOSIS — E785 Hyperlipidemia, unspecified: Secondary | ICD-10-CM

## 2016-10-21 ENCOUNTER — Telehealth: Payer: Self-pay | Admitting: *Deleted

## 2016-10-21 NOTE — Telephone Encounter (Signed)
Patient states that she started the new medication prior to going on vacation and gained 5 pounds and her blood pressure was up to 748'O systolic and reports that the diastolic was normal. Reviewed instructions from last office visit to start 1/2 tablet of losartan and increase if SBP greater than 150's and she can take diuretic as needed for swelling. She reports that she was confused on that and didn't realize she could do both. Advised her to start taking a whole pill of losartan and only the diuretic as needed. She read back those instructions and had no further questions about that.   Reviewed results and recommendations based on CT Calcium score and she states that she has been on crestor and lipitor in the past and had side effects with arm and leg numbness. She reports that she is not able to take any statins and would like another option for that. Reviewed with her that I would speak with Dr. Rockey Situ for further recommendations and then give her a call back. She was appreciative for the call and had has no further questions at this time.

## 2016-10-21 NOTE — Telephone Encounter (Signed)
-----   Message from Minna Merritts, MD sent at 10/20/2016 10:08 PM EDT ----- CT coronary calcium score, Score is elevated at 561,  This places her in the 78% percentile (out of 100 people, she has more blockage than 78 people) Amount is not critical, likely will not need more testing, like stres test (unless she devlops chest pain sx) We should treat, Work on cholesterol to limit progression. Consider crestor 5 mg qod, then slowly up to daily if tolerated

## 2016-10-24 NOTE — Telephone Encounter (Signed)
She can take zetia 10 mg daily Good for 10% drop in cholesterol Few other options

## 2016-10-25 MED ORDER — EZETIMIBE 10 MG PO TABS: 10.0000 mg | ORAL_TABLET | Freq: Every day | ORAL | 6 refills | Status: DC

## 2016-10-25 NOTE — Telephone Encounter (Signed)
Spoke w/ pt.   Advised her of Dr. Donivan Scull recommendation.  She is agreeable and will call back w/ any further questions or concerns.

## 2016-10-25 NOTE — Addendum Note (Signed)
Addended by: Dede Query R on: 10/25/2016 02:10 PM   Modules accepted: Orders

## 2016-10-27 NOTE — Telephone Encounter (Signed)
Error

## 2017-04-20 ENCOUNTER — Other Ambulatory Visit: Payer: Self-pay

## 2017-04-20 ENCOUNTER — Telehealth: Payer: Self-pay | Admitting: Cardiovascular Disease

## 2017-04-20 MED ORDER — EZETIMIBE 10 MG PO TABS: 10.0000 mg | ORAL_TABLET | Freq: Every day | ORAL | 0 refills | Status: DC

## 2017-04-20 NOTE — Telephone Encounter (Signed)
°*  STAT* If patient is at the pharmacy, call can be transferred to refill team.   1. Which medications need to be refilled? (please list name of each medication and dose if known)  Zetia 10 mg po q day   2. Which pharmacy/location (including street and city if local pharmacy) is medication to be sent to? [facility] in [place]   3. Do they need a 30 day or 90 day supply? [place]

## 2017-05-04 NOTE — Progress Notes (Signed)
Cardiology Office Note  Date:  05/05/2017   ID:  Sherry [name], Sherry [name] February 03, 1931, MRN [id number]  PCP:  Rusty Aus, MD   Chief Complaint  Patient presents with  . Other    6 month follow up. patient denies chest pain and SOB. Meds reviewed verbally with patient.     HPI:  Sherry [name] is a pleasant 84 year old woman with past medical history of Hypertension Hyperlipidemia with statin intolerance 10 years ago Palpitations Neuropathy in the feet CT coronary calcium score August 2018 score of 561, placing her at [place] percentile for her age Who presents for follow-up of her  hypertension and coronary calcification  In follow-up today she presents to discuss CT findings Images pulled up in the office showing relatively moderate amount of calcified plaque in the proximal to mid LAD No significant plaque in the circumflex or RCA Very minimal in the aorta  She reports having some mild fatigue/shortness of breath, very occasional chest discomfort   No recent lab work since April 2018 at that time total cholesterol 250 She has been tolerating Zetia 10 mg daily  Long discussion concerning previous use of crestor and lipitor Felt that she had some numbness in her arms, could not drive Symptoms resolved by stopping the medication  Blood pressure has been reasonably stable Did not tolerate losartan, unclear side effects She is back on methylclothiazide 5 mg daily Denies having any lower extremity edema  Reports that she had previous carotid ultrasound that showed no significant stenosis Records not available in care everywhere  Continues to have neuropathy bottom of her feet No EKG on today's visit  Family history  Mom with hyperlipidemia, cancer Mothers brother with MI Dad HTN   PMH:   has a past medical history of Hypertension and Skin cancer.  PSH:    Past Surgical History:  Procedure Laterality Date  . BACK SURGERY    . MOHS SURGERY      Current Outpatient  Medications  Medication Sig Dispense Refill  . celecoxib (CELEBREX) 200 MG capsule Take 200 mg by mouth daily.    Marland Kitchen ezetimibe (ZETIA) 10 MG tablet Take 1 tablet (10 mg total) by mouth daily. 90 tablet 3  . hydrocortisone 2.5 % cream Apply to rash under breast for severe flare twice daily for 1-2 weeks then take 1-2 weeks off and repeat if needed    . methyclothiazide (ENDURON) 5 MG tablet Take 1 tablet (5 mg total) by mouth daily.     No current facility-administered medications for this visit.      Allergies:   Morphine and related   Social History:  The patient  reports that  has never smoked. she has never used smokeless tobacco. She reports that she drinks alcohol. She reports that she does not use drugs.   Family History:   family history includes Hypertension in her father.    Review of Systems: Review of Systems  Constitutional: Negative.   Respiratory: Negative.   Cardiovascular: Positive for palpitations.  Gastrointestinal: Negative.   Musculoskeletal: Negative.   Neurological: Negative.        Numbness on bottom of feet  Psychiatric/Behavioral: Negative.   All other systems reviewed and are negative.    PHYSICAL EXAM: VS:  BP (!) 144/60 (BP Location: Left Arm, Patient Position: Sitting, Cuff Size: Normal)   Pulse 74   Ht 5\' 5"  (1.651 m)   Wt 148 lb (67.1 kg)   BMI 24.63 kg/m   , BMI Body mass  index is 24.63 kg/m. Constitutional:  oriented to person, place, and time. No distress.  HENT:  Head: Normocephalic and atraumatic.  Eyes:  no discharge. No scleral icterus.  Neck: Normal range of motion. Neck supple. No JVD present.  Cardiovascular: Normal rate, regular rhythm, normal heart sounds and intact distal pulses. Exam reveals no gallop and no friction rub. No edema No murmur heard. Pulmonary/Chest: Effort normal and breath sounds normal. No stridor. No respiratory distress.  no wheezes.  no rales.  no tenderness.  Abdominal: Soft.  no distension.  no  tenderness.  Musculoskeletal: Normal range of motion.  no  tenderness or deformity.  Neurological:  normal muscle tone. Coordination normal. No atrophy Skin: Skin is warm and dry. No rash noted. not diaphoretic.  Psychiatric:  normal mood and affect. behavior is normal. Thought content normal.   Recent Labs: No results found for requested labs within last 8760 hours.    Lipid Panel No results found for: CHOL, HDL, LDLCALC, TRIG  Results reviewed from care everywhere  Wt Readings from Last 3 Encounters:  05/05/17 148 lb (67.1 kg)  09/24/16 145 lb 4 oz (65.9 kg)  02/25/15 139 lb (63 kg)       ASSESSMENT AND PLAN:  Essential hypertension - Plan: EKG 12-Lead Blood pressure is well controlled on today's visit. No changes made to the medications. Stable  Hyperlipidemia, unspecified hyperlipidemia type -  Long discussion with her concerning various treatment options She is willing to stay on Zetia 10 mg daily We will add Crestor 5 mg every other day If tolerated after several weeks to months we could increase up to Crestor 5 mg daily with Zetia Goal LDL less than 70 given heavy calcification seen in the LAD  Coronary calcification CT coronary calcium score August 2018 score of 561, placing her at [place] percentile for her age.  Long discussion with her, images pulled up and examined in detail  Neuropathy Numbness in the bottom of her feet likely from neuropathy No diabetes Recommended she try different shoes   Shortness of breath, chest pain In light of her coronary calcification in the LAD recommended stress testing She does have problems with her feet .  We may need a pharmacologic Myoview  Palpitations Likely having APCs or PVCs, possibly exacerbated by stress If symptoms get worse could order a Holter monitor Currently stable  Disposition:   Follow-up once a year   Total encounter time more than 45 minutes  Greater than 50% was spent in counseling and coordination of  care with the patient    No orders of the defined types were placed in this encounter.    Signed, Esmond Plants, M.D., Ph.D. 05/05/2017  [place], [place]

## 2017-05-05 ENCOUNTER — Encounter: Payer: Self-pay | Admitting: Cardiovascular Disease

## 2017-05-05 ENCOUNTER — Ambulatory Visit (INDEPENDENT_AMBULATORY_CARE_PROVIDER_SITE_OTHER): Payer: Medicare Other | Admitting: Cardiovascular Disease

## 2017-05-05 VITALS — BP 144/60 | HR 74 | Ht 65.0 in | Wt 148.0 lb

## 2017-05-05 DIAGNOSIS — R0602 Shortness of breath: Secondary | ICD-10-CM

## 2017-05-05 DIAGNOSIS — I10 Essential (primary) hypertension: Secondary | ICD-10-CM | POA: Diagnosis not present

## 2017-05-05 DIAGNOSIS — F419 Anxiety disorder, unspecified: Secondary | ICD-10-CM

## 2017-05-05 DIAGNOSIS — I2510 Atherosclerotic heart disease of native coronary artery without angina pectoris: Secondary | ICD-10-CM

## 2017-05-05 DIAGNOSIS — G629 Polyneuropathy, unspecified: Secondary | ICD-10-CM | POA: Diagnosis not present

## 2017-05-05 DIAGNOSIS — Z789 Other specified health status: Secondary | ICD-10-CM | POA: Diagnosis not present

## 2017-05-05 DIAGNOSIS — R079 Chest pain, unspecified: Secondary | ICD-10-CM | POA: Diagnosis not present

## 2017-05-05 DIAGNOSIS — E785 Hyperlipidemia, unspecified: Secondary | ICD-10-CM | POA: Diagnosis not present

## 2017-05-05 DIAGNOSIS — R002 Palpitations: Secondary | ICD-10-CM

## 2017-05-05 MED ORDER — EZETIMIBE 10 MG PO TABS: 10.0000 mg | ORAL_TABLET | Freq: Every day | ORAL | 3 refills | Status: DC

## 2017-05-05 MED ORDER — ROSUVASTATIN CALCIUM 5 MG PO TABS: 5.0000 mg | ORAL_TABLET | Freq: Every day | ORAL | 3 refills | Status: DC

## 2017-05-05 NOTE — Patient Instructions (Addendum)
Medication Instructions:  Your physician has recommended you make the following change in your medication:   1) START Crestor 5 mg every other day to start, then 5 mg once daily if you are tolerating the every other day dosing  2) Co Q 10 (over the counter) if needed for cramping  Labwork:  No new labs needed  Testing/Procedures:  We will order a treadmill myoview for fatigue and chest pain (the week of 05/16/17)  [place] caregiver has ordered a Stress Test with nuclear imaging. The purpose of this test is to evaluate the blood supply to your heart muscle. This procedure is referred to as a "Non-Invasive Stress Test." This is because other than having an IV started in your vein, nothing is inserted or "invades" your body. Cardiac stress tests are done to find areas of poor blood flow to the heart by determining the extent of coronary artery disease (CAD). Some patients exercise on a treadmill, which naturally increases the blood flow to your heart, while others who are  unable to walk on a treadmill due to physical limitations have a pharmacologic/chemical stress agent called Lexiscan . This medicine will mimic walking on a treadmill by temporarily increasing your coronary blood flow.   Please note: these test may take anywhere between 2-4 hours to complete  PLEASE REPORT TO [place] AT THE FIRST DESK WILL DIRECT YOU WHERE TO GO  Date of Procedure:_____________________________________  Arrival Time for Procedure:______________________________  Instructions regarding medication:   _x___ : You may take all of your regular medications the morning of your test with enough water to get them down safely  PLEASE NOTIFY THE OFFICE AT LEAST 24 HOURS IN ADVANCE IF YOU ARE UNABLE TO [place].  [phone] AND  PLEASE NOTIFY NUCLEAR MEDICINE AT [facility] AT LEAST 24 HOURS IN ADVANCE IF YOU ARE UNABLE TO KEEP YOUR APPOINTMENT.  [phone]  How to prepare for your Myoview test:  1. Do not eat or drink after midnight 2. No caffeine for 24 hours prior to test 3. No smoking 24 hours prior to test. 4. Your medication may be taken with water.  If your doctor stopped a medication because of this test, do not take that medication. 5. Ladies, please do not wear dresses.  Skirts or pants are appropriate. Please wear a short sleeve shirt. 6. No perfume, cologne or lotion. 7. Wear comfortable walking shoes. No heels!   Follow-Up: It was a pleasure seeing you in the office today. Please call us if you have new issues that need to be addressed before your next appt.  [phone]  Your physician wants you to follow-up in: 12 months.  You will receive a reminder letter in the mail two months in advance. If you don't receive a letter, please call our office to schedule the follow-up appointment.  If you need a refill on your cardiac medications before your next appointment, please call your pharmacy.  For educational health videos Log in to : [email or web address] Or : [email or web address], password : [email or web address]    Cardiac Nuclear Scan A cardiac nuclear scan is a test that measures blood flow to the heart when a person is resting and when he or she is exercising. The test looks for problems such as:  Not enough blood reaching a portion of the heart.  The heart muscle not working normally.  You may need this test if:  You have heart disease.  You have had abnormal lab  results.  You have had heart surgery or angioplasty.  You have chest pain.  You have shortness of breath.  In this test, a radioactive dye (tracer) is injected into your bloodstream. After the tracer has traveled to your heart, an imaging device is used to measure how much of the tracer is absorbed by or distributed to various areas of your heart. This procedure is usually done at a hospital and takes 2-4 hours. Tell a health care provider about:  Any  allergies you have.  All medicines you are taking, including vitamins, herbs, eye drops, creams, and over-the-counter medicines.  Any problems you or family members have had with the use of anesthetic medicines.  Any blood disorders you have.  Any surgeries you have had.  Any medical conditions you have.  Whether you are pregnant or may be pregnant. What are the risks? Generally, this is a safe procedure. However, problems may occur, including:  Serious chest pain and heart attack. This is only a risk if the stress portion of the test is done.  Rapid heartbeat.  Sensation of warmth in your chest. This usually passes quickly.  What happens before the procedure?  Ask your health care provider about changing or stopping your regular medicines. This is especially important if you are taking diabetes medicines or blood thinners.  Remove your jewelry on the day of the procedure. What happens during the procedure?  An IV tube will be inserted into one of your veins.  Your health care provider will inject a small amount of radioactive tracer through the tube.  You will wait for 20-40 minutes while the tracer travels through your bloodstream.  Your heart activity will be monitored with an electrocardiogram (ECG).  You will lie down on an exam table.  Images of your heart will be taken for about 15-20 minutes.  You may be asked to exercise on a treadmill or stationary bike. While you exercise, your heart's activity will be monitored with an ECG, and your blood pressure will be checked. If you are unable to exercise, you may be given a medicine to increase blood flow to parts of your heart.  When blood flow to your heart has peaked, a tracer will again be injected through the IV tube.  After 20-40 minutes, you will get back on the exam table and have more images taken of your heart.  When the procedure is over, your IV tube will be removed. The procedure may vary among health  care providers and hospitals. Depending on the type of tracer used, scans may need to be repeated 3-4 hours later. What happens after the procedure?  Unless your health care provider tells you otherwise, you may return to your normal schedule, including diet, activities, and medicines.  Unless your health care provider tells you otherwise, you may increase your fluid intake. This will help flush the contrast dye from your body. Drink enough fluid to keep your urine clear or pale yellow.  It is up to you to get your test results. Ask your health care provider, or the department that is doing the test, when your results will be ready. Summary  A cardiac nuclear scan measures the blood flow to the heart when a person is resting and when he or she is exercising.  You may need this test if you are at risk for heart disease.  Tell your health care provider if you are pregnant.  Unless your health care provider tells you otherwise, increase your fluid  intake. This will help flush the contrast dye from your body. Drink enough fluid to keep your urine clear or pale yellow. This information is not intended to replace advice given to you by your health care provider. Make sure you discuss any questions you have with your health care provider. Document Released: 03/12/2004 Document Revised: 02/18/2016 Document Reviewed: 01/24/2013 Elsevier Interactive Patient Education  2017 [employer].

## 2017-05-26 ENCOUNTER — Encounter
Admission: RE | Admit: 2017-05-26 | Discharge: 2017-05-26 | Disposition: A | Discharge status: Home / Self Care | Payer: Medicare Other | Source: Ambulatory Visit | Attending: Cardiovascular Disease | Admitting: Cardiovascular Disease

## 2017-05-26 DIAGNOSIS — R079 Chest pain, unspecified: Secondary | ICD-10-CM

## 2017-05-26 LAB — NM MYOCAR MULTI W/SPECT W/WALL MOTION / EF
CHL CUP NUCLEAR SDS: 2
CHL CUP NUCLEAR SRS: 0
CHL CUP NUCLEAR SSS: 4
CSEPED: 3 min
CSEPEW: 4.6 METS
Exercise duration (sec): 31 s
LV sys vol: 7 mL
LVDIAVOL: 25 mL (ref 46–106)
NUC STRESS TID: 1.17
Peak HR: 118 {beats}/min
Percent HR: 86 %
Rest HR: 64 {beats}/min

## 2017-05-26 MED ORDER — TECHNETIUM TC 99M TETROFOSMIN IV KIT
13.4500 | PACK | Freq: Once | INTRAVENOUS | Status: AC | PRN
  Administered 2017-05-26: 13.45 via INTRAVENOUS

## 2017-05-26 MED ORDER — TECHNETIUM TC 99M TETROFOSMIN IV KIT
29.7100 | PACK | Freq: Once | INTRAVENOUS | Status: AC | PRN
  Administered 2017-05-26: 29.71 via INTRAVENOUS

## 2017-05-27 ENCOUNTER — Telehealth: Payer: Self-pay | Admitting: Cardiovascular Disease

## 2017-05-27 NOTE — Telephone Encounter (Signed)
I called and spoke with the patient regarding her nuclear stress test results- she is aware that this was normal per Dr. Rockey Situ.  She proceeded to ask me "what about the blood pressure and the heart rate?" She states the PA and the nurse were concerned as her BP did go up very high during the stress part of her test and that her HR was high.   Looking at her nuclear report,  Resting HR: 64 Resting BP: 159/92 Peak HR: 118 Peak BP: 231/84  I advised her that her resting HR was normal and her peak HR at 118 was a normal response to the stress portion of the test. In regards to her BP- I advised her that her resting BP was a little higher than where we would like to see this in general and that it did go up quite a bit with stress.  She confirms that she normally takes methyclothiazide 10 mg once daily.  Her chart states 5 mg once daily.  She states " I cannot talk to Dr. Rockey Situ about this right now because I am headed to [facility]." I advised the patient that the doctor was not currently in the office, but I will forward a message to him to review her BP readings during her stress test.

## 2017-05-29 NOTE — Telephone Encounter (Signed)
Would have her send [place] some BP measurements at rest to 10 mg If good blood pressure, we can keep it at that dose

## 2017-05-30 NOTE — Telephone Encounter (Signed)
Spoke with patient and reviewed Dr. Donivan Scull recommendations to monitor blood pressures. She verbalized understanding and requested copy of her stress test. Reviewed that she would need to come by the office to pick that up. She verbalized understanding of our conversation, agreement with plan and had no further questions at this time.

## 2017-08-08 ENCOUNTER — Encounter
Admission: RE | Admit: 2017-08-08 | Discharge: 2017-08-08 | Disposition: A | Discharge status: Home / Self Care | Payer: Medicare Other | Source: Ambulatory Visit | Attending: Internal Medicine | Admitting: Internal Medicine

## 2017-08-15 ENCOUNTER — Other Ambulatory Visit
Admission: RE | Admit: 2017-08-15 | Discharge: 2017-08-15 | Disposition: A | Discharge status: Home / Self Care | Payer: Medicare Other | Source: Ambulatory Visit | Attending: Gerontology | Admitting: Gerontology

## 2017-08-15 DIAGNOSIS — X58XXXD Exposure to other specified factors, subsequent encounter: Secondary | ICD-10-CM | POA: Diagnosis not present

## 2017-08-15 DIAGNOSIS — S065X9D Traumatic subdural hemorrhage with loss of consciousness of unspecified duration, subsequent encounter: Secondary | ICD-10-CM | POA: Diagnosis present

## 2017-08-15 LAB — COMPREHENSIVE METABOLIC PANEL
ALBUMIN: 3.5 g/dL (ref 3.5–5.0)
ALK PHOS: 70 U/L (ref 38–126)
ALT: 20 U/L (ref 14–54)
ANION GAP: 12 (ref 5–15)
AST: 23 U/L (ref 15–41)
BUN: 20 mg/dL (ref 6–20)
CALCIUM: 9.5 mg/dL (ref 8.9–10.3)
CHLORIDE: 98 mmol/L — AB (ref 101–111)
CO2: 30 mmol/L (ref 22–32)
Creatinine, Ser: 0.45 mg/dL (ref 0.44–1.00)
GFR calc Af Amer: >60 mL/min (ref >60)
GFR calc non Af Amer: >60 mL/min (ref >60)
GLUCOSE: 91 mg/dL (ref 65–99)
Potassium: 2.8 mmol/L — ABNORMAL LOW (ref 3.5–5.1)
SODIUM: 140 mmol/L (ref 135–145)
Total Bilirubin: 0.5 mg/dL (ref 0.3–1.2)
Total Protein: 6.6 g/dL (ref 6.5–8.1)

## 2017-08-15 LAB — CBC WITH DIFFERENTIAL/PLATELET
BASOS PCT: 1 %
Basophils Absolute: 0.1 10*3/uL (ref 0–0.1)
Eosinophils Absolute: 0.2 10*3/uL (ref 0–0.7)
Eosinophils Relative: 2 %
HCT: 38.4 % (ref 35.0–47.0)
Hemoglobin: 13.3 g/dL (ref 12.0–16.0)
Lymphocytes Relative: 32 %
Lymphs Abs: 2.5 10*3/uL (ref 1.0–3.6)
MCH: 30.7 pg (ref 26.0–34.0)
MCHC: 34.7 g/dL (ref 32.0–36.0)
MCV: 88.4 fL (ref 80.0–100.0)
MONO ABS: 0.6 10*3/uL (ref 0.2–0.9)
MONOS PCT: 8 %
NEUTROS PCT: 57 %
Neutro Abs: 4.3 10*3/uL (ref 1.4–6.5)
Platelets: 320 10*3/uL (ref 150–440)
RBC: 4.34 MIL/uL (ref 3.80–5.20)
RDW: 13.5 % (ref 11.5–14.5)
WBC: 7.7 10*3/uL (ref 3.6–11.0)

## 2017-08-15 LAB — MAGNESIUM: Magnesium: 1.5 mg/dL — ABNORMAL LOW (ref 1.7–2.4)

## 2017-08-16 LAB — VITAMIN D 25 HYDROXY (VIT D DEFICIENCY, FRACTURES): Vit D, 25-Hydroxy: 20.1 ng/mL — ABNORMAL LOW (ref 30.0–100.0)

## 2017-08-23 ENCOUNTER — Other Ambulatory Visit: Payer: Self-pay | Admitting: Nurse Practitioner

## 2017-08-23 DIAGNOSIS — S0990XA Unspecified injury of head, initial encounter: Secondary | ICD-10-CM

## 2017-09-05 ENCOUNTER — Other Ambulatory Visit: Payer: Self-pay | Admitting: Neurosurgery

## 2017-09-05 ENCOUNTER — Ambulatory Visit
Admission: RE | Admit: 2017-09-05 | Discharge: 2017-09-05 | Disposition: A | Discharge status: Home / Self Care | Payer: Medicare Other | Source: Ambulatory Visit | Attending: Neurosurgery | Admitting: Neurosurgery

## 2017-09-05 DIAGNOSIS — S065XAA Traumatic subdural hemorrhage with loss of consciousness status unknown, initial encounter: Secondary | ICD-10-CM

## 2017-09-05 DIAGNOSIS — S065X9A Traumatic subdural hemorrhage with loss of consciousness of unspecified duration, initial encounter: Secondary | ICD-10-CM

## 2017-09-22 ENCOUNTER — Ambulatory Visit: Payer: Medicare Other

## 2017-10-18 ENCOUNTER — Other Ambulatory Visit: Payer: Self-pay | Admitting: Internal Medicine

## 2017-10-18 DIAGNOSIS — S065X9A Traumatic subdural hemorrhage with loss of consciousness of unspecified duration, initial encounter: Secondary | ICD-10-CM

## 2017-10-18 DIAGNOSIS — S065XAA Traumatic subdural hemorrhage with loss of consciousness status unknown, initial encounter: Secondary | ICD-10-CM

## 2017-11-25 ENCOUNTER — Ambulatory Visit: Payer: Medicare Other

## 2017-11-25 ENCOUNTER — Ambulatory Visit
Admission: RE | Admit: 2017-11-25 | Discharge: 2017-11-25 | Disposition: A | Discharge status: Home / Self Care | Payer: Medicare Other | Source: Ambulatory Visit | Attending: Internal Medicine | Admitting: Internal Medicine

## 2017-11-25 DIAGNOSIS — Z09 Encounter for follow-up examination after completed treatment for conditions other than malignant neoplasm: Secondary | ICD-10-CM | POA: Diagnosis not present

## 2017-11-25 DIAGNOSIS — I6381 Other cerebral infarction due to occlusion or stenosis of small artery: Secondary | ICD-10-CM | POA: Diagnosis not present

## 2017-11-25 DIAGNOSIS — G319 Degenerative disease of nervous system, unspecified: Secondary | ICD-10-CM | POA: Diagnosis not present

## 2017-11-25 DIAGNOSIS — S065XAA Traumatic subdural hemorrhage with loss of consciousness status unknown, initial encounter: Secondary | ICD-10-CM

## 2017-11-25 DIAGNOSIS — S065X9A Traumatic subdural hemorrhage with loss of consciousness of unspecified duration, initial encounter: Secondary | ICD-10-CM

## 2018-06-28 ENCOUNTER — Telehealth: Payer: Self-pay

## 2018-06-28 NOTE — Telephone Encounter (Signed)
Spoke with patient to schedule her overdue F/U with Dr. Rockey Situ. Patient states she is not interested in scheduling an appointment at this time and does not want put on the recall list. "I will call your office when I am ready to schedule." Recall removed.

## 2019-05-24 ENCOUNTER — Ambulatory Visit: Payer: Medicare Other | Admitting: Dermatology

## 2019-07-06 ENCOUNTER — Other Ambulatory Visit: Payer: Self-pay

## 2019-07-06 ENCOUNTER — Encounter: Payer: Self-pay | Admitting: Emergency Medicine

## 2019-07-06 ENCOUNTER — Emergency Department
Admission: EM | Admit: 2019-07-06 | Discharge: 2019-07-06 | Disposition: A | Discharge status: Home / Self Care | Payer: Medicare Other | Attending: Emergency Medicine | Admitting: Emergency Medicine

## 2019-07-06 ENCOUNTER — Emergency Department: Payer: Medicare Other

## 2019-07-06 DIAGNOSIS — Y999 Unspecified external cause status: Secondary | ICD-10-CM | POA: Diagnosis not present

## 2019-07-06 DIAGNOSIS — Y939 Activity, unspecified: Secondary | ICD-10-CM | POA: Insufficient documentation

## 2019-07-06 DIAGNOSIS — S0101XA Laceration without foreign body of scalp, initial encounter: Secondary | ICD-10-CM | POA: Insufficient documentation

## 2019-07-06 DIAGNOSIS — W0110XA Fall on same level from slipping, tripping and stumbling with subsequent striking against unspecified object, initial encounter: Secondary | ICD-10-CM | POA: Diagnosis not present

## 2019-07-06 DIAGNOSIS — I10 Essential (primary) hypertension: Secondary | ICD-10-CM | POA: Insufficient documentation

## 2019-07-06 DIAGNOSIS — Y92513 Shop (commercial) as the place of occurrence of the external cause: Secondary | ICD-10-CM | POA: Insufficient documentation

## 2019-07-06 DIAGNOSIS — W19XXXA Unspecified fall, initial encounter: Secondary | ICD-10-CM

## 2019-07-06 DIAGNOSIS — S0990XA Unspecified injury of head, initial encounter: Secondary | ICD-10-CM

## 2019-07-06 MED ORDER — LIDOCAINE-EPINEPHRINE 2 %-1:100000 IJ SOLN
20.0000 mL | Freq: Once | INTRAMUSCULAR | Status: DC
  Filled 2019-07-06: qty 1

## 2019-07-06 NOTE — ED Provider Notes (Signed)
[facility] Emergency Department Provider Note       Time seen: ----------------------------------------- 3:30 PM on 07/06/2019 -----------------------------------------   I have reviewed the triage vital signs and the nursing notes.  HISTORY   Chief Complaint Fall    HPI Sherry [name] is a 86 y.o. female with a history of hypertension, hyperlipidemia, neuropathy, coronary disease who presents to the ED for a mechanical fall at a nail salon.  Patient states she hit the side of her head and sustained a laceration.  Discomfort was 8 out of 10 on the right side of her head that was throbbing and constant.  Past Medical History:  Diagnosis Date  . Actinic keratosis   . Hypertension     Patient Active Problem List   Diagnosis Date Noted  . Coronary artery calcification seen on CAT scan 05/05/2017  . Chest pain with moderate risk for cardiac etiology 05/05/2017  . Shortness of breath 05/05/2017  . Hyperlipidemia 09/25/2016  . Neuropathy 09/25/2016  . Statin intolerance 09/25/2016  . Anxiety 09/25/2016  . Palpitations 09/25/2016    Past Surgical History:  Procedure Laterality Date  . BACK SURGERY    . MOHS SURGERY      Allergies Codeine, Morphine and related, and Percocet [oxycodone-acetaminophen]  Social History Social History   Tobacco Use  . Smoking status: Never Smoker  . Smokeless tobacco: Never Used  Substance Use Topics  . Alcohol use: Yes    Comment: Occassionally  . Drug use: No    Review of Systems Constitutional: Negative for fever. Cardiovascular: Negative for chest pain. Respiratory: Negative for shortness of breath. Gastrointestinal: Negative for abdominal pain, vomiting and diarrhea. Musculoskeletal: Negative for back pain. Skin: Positive for scalp laceration Neurological: Positive for headache  All systems negative/normal/unremarkable except as stated in the  HPI  ____________________________________________   PHYSICAL EXAM:  VITAL SIGNS: ED Triage Vitals  Enc Vitals Group     BP 07/06/19 1447 (!) 213/86     Pulse Rate 07/06/19 1447 78     Resp 07/06/19 1447 17     Temp 07/06/19 1447 98 F (36.7 C)     Temp Source 07/06/19 1447 Oral     SpO2 07/06/19 1447 98 %     Weight 07/06/19 1448 140 lb (63.5 kg)     Height 07/06/19 1448 5\' 4"  (1.626 m)     Head Circumference --      Peak Flow --      Pain Score 07/06/19 1447 8     Pain Loc --      Pain Edu? --      Excl. in [place]? --     Constitutional: Alert and oriented. Well appearing and in no distress. Eyes: Conjunctivae are normal. Normal extraocular movements. ENT      Head: Normocephalic, right temporal scalp laceration of 2.5 cm      Nose: No congestion/rhinnorhea.      Mouth/Throat: Mucous membranes are moist.      Neck: No stridor. Respiratory: Normal respiratory effort without tachypnea nor retractions.  Musculoskeletal: Nontender with normal range of motion in extremities. Neurologic:  No gross focal neurologic deficits are appreciated.  Skin: 2.5 cm vertically oriented scalp laceration as noted above Psychiatric: Mood and affect are normal.  ____________________________________________  ED COURSE:  As part of my medical decision making, I reviewed the following data within the [place] History obtained from family if available, nursing notes, old chart and ekg, as well as notes  from prior ED visits. Patient presented for a head injury, we will assess with imaging as indicated at this time.   Marland Kitchen.Laceration Repair  Date/Time: 07/06/2019 3:34 PM Performed by: Earleen Newport, MD Authorized by: Earleen Newport, MD   Consent:    Consent obtained:  Verbal   Consent given by:  Patient Anesthesia (see MAR for exact dosages):    Anesthesia method:  Local infiltration   Local anesthetic:  Lidocaine 1% WITH epi Laceration details:    Location:   Scalp   Scalp location:  R temporal   Length (cm):  2.5   Depth (mm):  5 Repair type:    Repair type:  Simple Exploration:    Contaminated: no   Treatment:    Area cleansed with:  Betadine   Amount of cleaning:  Standard   Irrigation solution:  Sterile saline Skin repair:    Repair method:  Sutures   Suture size:  4-0   Number of sutures:  6 Approximation:    Approximation:  Close Post-procedure details:    Dressing:  Open (no dressing)   Patient tolerance of procedure:  Tolerated well, no immediate complications    Sherry [name] was evaluated in Emergency Department on 07/06/2019 for the symptoms described in the history of present illness. She was evaluated in the context of the global COVID-19 pandemic, which necessitated consideration that the patient might be at risk for infection with the SARS-CoV-2 virus that causes COVID-19. Institutional protocols and algorithms that pertain to the evaluation of patients at risk for COVID-19 are in a state of rapid change based on information released by regulatory bodies including the CDC and federal and state organizations. These policies and algorithms were followed during the patient's care in the ED.   RADIOLOGY  CT head Is unremarkable ____________________________________________   DIFFERENTIAL DIAGNOSIS   Fall, head injury, scalp laceration, subdural  FINAL ASSESSMENT AND PLAN  Fall, head injury, scalp laceration   Plan: The patient had presented for mechanical fall with resulting laceration which was repaired as dictated above.  CT head was unremarkable.  She is encouraged to follow-up in 10 days for suture removal.   Laurence Aly, MD    Note: This note was generated in part or whole with voice recognition software. Voice recognition is usually quite accurate but there are transcription errors that can and very often do occur. I apologize for any typographical errors that were not detected and corrected.      Earleen Newport, MD 07/06/19 1535

## 2019-07-06 NOTE — ED Notes (Signed)
Pt trx to CT.  

## 2019-07-06 NOTE — ED Notes (Signed)
Pt's husband at bedside.

## 2019-07-06 NOTE — ED Triage Notes (Signed)
Pt arrived via [facility]. Pt had mechanical fall at nail salon. Pt present w/ laceration to rt side of head. Pt axo x4 in NAD.

## 2019-07-06 NOTE — ED Notes (Signed)
E-signature not working at this time. Pt verbalized understanding of D/C instructions, prescriptions and follow up care with no further questions at this time. Pt in NAD and ambulatory at time of D/C.  

## 2019-07-06 NOTE — ED Notes (Signed)
EDP at bedside for lac repair. 

## 2020-02-11 ENCOUNTER — Emergency Department: Payer: Medicare Other

## 2020-02-11 ENCOUNTER — Encounter: Payer: Self-pay | Admitting: Emergency Medicine

## 2020-02-11 ENCOUNTER — Inpatient Hospital Stay
Admission: EM | Admit: 2020-02-11 | Discharge: 2020-02-17 | DRG: 552 | Disposition: A | Discharge status: Skilled Nursing Facility | Payer: Medicare Other | Attending: Internal Medicine | Admitting: Internal Medicine

## 2020-02-11 ENCOUNTER — Other Ambulatory Visit: Payer: Self-pay

## 2020-02-11 DIAGNOSIS — M19072 Primary osteoarthritis, left ankle and foot: Secondary | ICD-10-CM | POA: Diagnosis present

## 2020-02-11 DIAGNOSIS — Z8249 Family history of ischemic heart disease and other diseases of the circulatory system: Secondary | ICD-10-CM

## 2020-02-11 DIAGNOSIS — S12100A Unspecified displaced fracture of second cervical vertebra, initial encounter for closed fracture: Secondary | ICD-10-CM

## 2020-02-11 DIAGNOSIS — R42 Dizziness and giddiness: Secondary | ICD-10-CM | POA: Diagnosis present

## 2020-02-11 DIAGNOSIS — I4510 Unspecified right bundle-branch block: Secondary | ICD-10-CM | POA: Diagnosis present

## 2020-02-11 DIAGNOSIS — S12110A Anterior displaced Type II dens fracture, initial encounter for closed fracture: Secondary | ICD-10-CM | POA: Diagnosis present

## 2020-02-11 DIAGNOSIS — G629 Polyneuropathy, unspecified: Secondary | ICD-10-CM

## 2020-02-11 DIAGNOSIS — S12001A Unspecified nondisplaced fracture of first cervical vertebra, initial encounter for closed fracture: Principal | ICD-10-CM

## 2020-02-11 DIAGNOSIS — E785 Hyperlipidemia, unspecified: Secondary | ICD-10-CM | POA: Diagnosis present

## 2020-02-11 DIAGNOSIS — W010XXA Fall on same level from slipping, tripping and stumbling without subsequent striking against object, initial encounter: Secondary | ICD-10-CM | POA: Diagnosis present

## 2020-02-11 DIAGNOSIS — I10 Essential (primary) hypertension: Secondary | ICD-10-CM | POA: Diagnosis present

## 2020-02-11 DIAGNOSIS — Y92019 Unspecified place in single-family (private) house as the place of occurrence of the external cause: Secondary | ICD-10-CM

## 2020-02-11 DIAGNOSIS — W19XXXA Unspecified fall, initial encounter: Secondary | ICD-10-CM

## 2020-02-11 DIAGNOSIS — Z79899 Other long term (current) drug therapy: Secondary | ICD-10-CM

## 2020-02-11 DIAGNOSIS — E876 Hypokalemia: Secondary | ICD-10-CM | POA: Diagnosis present

## 2020-02-11 DIAGNOSIS — S129XXA Fracture of neck, unspecified, initial encounter: Secondary | ICD-10-CM | POA: Diagnosis present

## 2020-02-11 DIAGNOSIS — Z20822 Contact with and (suspected) exposure to covid-19: Secondary | ICD-10-CM | POA: Diagnosis present

## 2020-02-11 DIAGNOSIS — I160 Hypertensive urgency: Secondary | ICD-10-CM | POA: Diagnosis present

## 2020-02-11 DIAGNOSIS — F419 Anxiety disorder, unspecified: Secondary | ICD-10-CM | POA: Diagnosis present

## 2020-02-11 DIAGNOSIS — Z791 Long term (current) use of non-steroidal anti-inflammatories (NSAID): Secondary | ICD-10-CM

## 2020-02-11 DIAGNOSIS — R52 Pain, unspecified: Secondary | ICD-10-CM

## 2020-02-11 DIAGNOSIS — Z789 Other specified health status: Secondary | ICD-10-CM | POA: Diagnosis present

## 2020-02-11 DIAGNOSIS — S12000A Unspecified displaced fracture of first cervical vertebra, initial encounter for closed fracture: Secondary | ICD-10-CM | POA: Diagnosis present

## 2020-02-11 DIAGNOSIS — M1712 Unilateral primary osteoarthritis, left knee: Secondary | ICD-10-CM | POA: Diagnosis present

## 2020-02-11 DIAGNOSIS — M25569 Pain in unspecified knee: Secondary | ICD-10-CM

## 2020-02-11 DIAGNOSIS — Z885 Allergy status to narcotic agent status: Secondary | ICD-10-CM

## 2020-02-11 LAB — COMPREHENSIVE METABOLIC PANEL
ALT: 17 U/L (ref 0–44)
AST: 22 U/L (ref 15–41)
Albumin: 3.9 g/dL (ref 3.5–5.0)
Alkaline Phosphatase: 55 U/L (ref 38–126)
Anion gap: 11 (ref 5–15)
BUN: 24 mg/dL — ABNORMAL HIGH (ref 8–23)
CO2: 27 mmol/L (ref 22–32)
Calcium: 9.7 mg/dL (ref 8.9–10.3)
Chloride: 104 mmol/L (ref 98–111)
Creatinine, Ser: 0.51 mg/dL (ref 0.44–1.00)
GFR, Estimated: >60 mL/min (ref >60)
Glucose, Bld: 103 mg/dL — ABNORMAL HIGH (ref 70–99)
Potassium: 3.7 mmol/L (ref 3.5–5.1)
Sodium: 142 mmol/L (ref 135–145)
Total Bilirubin: 0.7 mg/dL (ref 0.3–1.2)
Total Protein: 7 g/dL (ref 6.5–8.1)

## 2020-02-11 LAB — CBC WITH DIFFERENTIAL/PLATELET
Abs Immature Granulocytes: 0.03 10*3/uL (ref 0.00–0.07)
Basophils Absolute: 0 10*3/uL (ref 0.0–0.1)
Basophils Relative: 1 %
Eosinophils Absolute: 0 10*3/uL (ref 0.0–0.5)
Eosinophils Relative: 0 %
HCT: 39.8 % (ref 36.0–46.0)
Hemoglobin: 13.4 g/dL (ref 12.0–15.0)
Immature Granulocytes: 0 %
Lymphocytes Relative: 14 %
Lymphs Abs: 1 10*3/uL (ref 0.7–4.0)
MCH: 29.6 pg (ref 26.0–34.0)
MCHC: 33.7 g/dL (ref 30.0–36.0)
MCV: 87.9 fL (ref 80.0–100.0)
Monocytes Absolute: 0.6 10*3/uL (ref 0.1–1.0)
Monocytes Relative: 7 %
Neutro Abs: 6 10*3/uL (ref 1.7–7.7)
Neutrophils Relative %: 78 %
Platelets: 217 10*3/uL (ref 150–400)
RBC: 4.53 MIL/uL (ref 3.87–5.11)
RDW: 13.8 % (ref 11.5–15.5)
WBC: 7.7 10*3/uL (ref 4.0–10.5)
nRBC: 0 % (ref 0.0–0.2)

## 2020-02-11 LAB — RESP PANEL BY RT-PCR (FLU A&B, COVID) ARPGX2
Influenza A by PCR: NEGATIVE
Influenza B by PCR: NEGATIVE
SARS Coronavirus 2 by RT PCR: NEGATIVE

## 2020-02-11 LAB — TROPONIN I (HIGH SENSITIVITY)
Troponin I (High Sensitivity): 7 ng/L (ref <18)
Troponin I (High Sensitivity): 7 ng/L (ref <18)

## 2020-02-11 MED ORDER — HYDROCODONE-ACETAMINOPHEN 5-325 MG PO TABS
1.0000 | ORAL_TABLET | Freq: Four times a day (QID) | ORAL | Status: DC | PRN
  Administered 2020-02-11: 1 via ORAL
  Filled 2020-02-11: qty 1

## 2020-02-11 MED ORDER — FENTANYL CITRATE (PF) 100 MCG/2ML IJ SOLN
50.0000 ug | Freq: Once | INTRAMUSCULAR | Status: AC
  Administered 2020-02-11: 50 ug via INTRAVENOUS
  Filled 2020-02-11: qty 2

## 2020-02-11 MED ORDER — LABETALOL HCL 5 MG/ML IV SOLN
5.0000 mg | Freq: Once | INTRAVENOUS | Status: AC
  Administered 2020-02-11: 5 mg via INTRAVENOUS
  Filled 2020-02-11: qty 4

## 2020-02-11 MED ORDER — HYDRALAZINE HCL 20 MG/ML IJ SOLN
10.0000 mg | Freq: Four times a day (QID) | INTRAMUSCULAR | Status: DC | PRN
Start: 2020-02-11 — End: 2020-02-12
  Administered 2020-02-11 – 2020-02-12 (×2): 10 mg via INTRAVENOUS
  Filled 2020-02-11 (×2): qty 1

## 2020-02-11 MED ORDER — ACETAMINOPHEN 650 MG RE SUPP: 650.0000 mg | Freq: Four times a day (QID) | RECTAL | Status: DC | PRN

## 2020-02-11 MED ORDER — ONDANSETRON HCL 4 MG PO TABS
4.0000 mg | ORAL_TABLET | Freq: Four times a day (QID) | ORAL | Status: DC | PRN
  Administered 2020-02-14: 03:00:00 4 mg via ORAL
  Filled 2020-02-11 (×2): qty 1

## 2020-02-11 MED ORDER — ACETAMINOPHEN 325 MG PO TABS
650.0000 mg | ORAL_TABLET | Freq: Four times a day (QID) | ORAL | Status: DC | PRN
  Administered 2020-02-11 – 2020-02-16 (×12): 650 mg via ORAL
  Filled 2020-02-11 (×12): qty 2

## 2020-02-11 MED ORDER — LABETALOL HCL 5 MG/ML IV SOLN
5.0000 mg | Freq: Once | INTRAVENOUS | Status: AC
  Administered 2020-02-11: 17:00:00 5 mg via INTRAVENOUS

## 2020-02-11 MED ORDER — METOCLOPRAMIDE HCL 5 MG/ML IJ SOLN
10.0000 mg | Freq: Once | INTRAMUSCULAR | Status: AC
  Administered 2020-02-11: 17:00:00 10 mg via INTRAVENOUS
  Filled 2020-02-11: qty 2

## 2020-02-11 MED ORDER — HYDROMORPHONE HCL 1 MG/ML IJ SOLN: 0.5000 mg | Freq: Once | INTRAMUSCULAR | Status: DC

## 2020-02-11 MED ORDER — HYDROCHLOROTHIAZIDE 25 MG PO TABS
25.0000 mg | ORAL_TABLET | Freq: Every day | ORAL | Status: DC
  Administered 2020-02-13 – 2020-02-14 (×2): 25 mg via ORAL
  Filled 2020-02-11 (×3): qty 1

## 2020-02-11 MED ORDER — GADOBUTROL 1 MMOL/ML IV SOLN
6.0000 mL | Freq: Once | INTRAVENOUS | Status: AC | PRN
  Administered 2020-02-11: 6 mL via INTRAVENOUS
  Filled 2020-02-11: qty 6

## 2020-02-11 MED ORDER — HYDROCODONE-ACETAMINOPHEN 5-325 MG PO TABS
1.0000 | ORAL_TABLET | Freq: Once | ORAL | Status: AC
  Administered 2020-02-11: 1 via ORAL
  Filled 2020-02-11: qty 1

## 2020-02-11 MED ORDER — HYDROMORPHONE HCL 1 MG/ML IJ SOLN: 1.0000 mg | INTRAMUSCULAR | Status: DC | PRN

## 2020-02-11 MED ORDER — FENTANYL CITRATE (PF) 100 MCG/2ML IJ SOLN
25.0000 ug | Freq: Once | INTRAMUSCULAR | Status: AC
  Administered 2020-02-11: 25 ug via INTRAVENOUS
  Filled 2020-02-11: qty 2

## 2020-02-11 MED ORDER — SENNOSIDES-DOCUSATE SODIUM 8.6-50 MG PO TABS: 1.0000 | ORAL_TABLET | Freq: Every evening | ORAL | Status: DC | PRN

## 2020-02-11 MED ORDER — SODIUM CHLORIDE 0.9 % IV SOLN: INTRAVENOUS | Status: DC

## 2020-02-11 MED ORDER — ONDANSETRON HCL 4 MG/2ML IJ SOLN
4.0000 mg | Freq: Once | INTRAMUSCULAR | Status: AC
Start: 2020-02-11 — End: 2020-02-11
  Administered 2020-02-11: 16:00:00 4 mg via INTRAVENOUS
  Filled 2020-02-11: qty 2

## 2020-02-11 MED ORDER — ONDANSETRON HCL 4 MG/2ML IJ SOLN
4.0000 mg | Freq: Four times a day (QID) | INTRAMUSCULAR | Status: DC | PRN
  Administered 2020-02-11: 20:00:00 4 mg via INTRAVENOUS
  Filled 2020-02-11: qty 2

## 2020-02-11 MED ORDER — ONDANSETRON 4 MG PO TBDP
4.0000 mg | ORAL_TABLET | Freq: Once | ORAL | Status: AC
  Administered 2020-02-11: 4 mg via ORAL
  Filled 2020-02-11: qty 1

## 2020-02-11 MED ORDER — ENOXAPARIN SODIUM 40 MG/0.4ML ~~LOC~~ SOLN
40.0000 mg | SUBCUTANEOUS | Status: DC
  Administered 2020-02-11 – 2020-02-16 (×6): 40 mg via SUBCUTANEOUS
  Filled 2020-02-11 (×6): qty 0.4

## 2020-02-11 MED ORDER — FENTANYL CITRATE (PF) 100 MCG/2ML IJ SOLN
50.0000 ug | Freq: Once | INTRAMUSCULAR | Status: AC
  Administered 2020-02-11: 17:00:00 50 ug via INTRAVENOUS
  Filled 2020-02-11: qty 2

## 2020-02-11 NOTE — ED Notes (Signed)
Pt reporting worsened HA and dizziness. No neuro deficits noted, but BP meds and scan orders to follow

## 2020-02-11 NOTE — ED Provider Notes (Signed)
[facility] Emergency Department Provider Note  ____________________________________________   Event Date/Time   First MD Initiated Contact with Patient 02/11/20 1138     (approximate)  I have reviewed the triage vital signs and the nursing notes.   HISTORY  Chief Complaint Fall    HPI Sherry [name] is a 86 y.o. female presents emergency department complaining of a fall.  States today she was at home and tripped over a rug and fell hitting the front of her face.  She is complaining of severe neck pain.  Patient had no LOC.  No vomiting since incident.   Patient does have a history of a head bleed after a fall several years ago.  Pain is rated at 8/10   Past Medical History:  Diagnosis Date  . Actinic keratosis   . Hypertension     Patient Active Problem List   Diagnosis Date Noted  . Neck fracture ([place]) 02/11/2020  . Coronary artery calcification seen on CAT scan 05/05/2017  . Chest pain with moderate risk for cardiac etiology 05/05/2017  . Shortness of breath 05/05/2017  . Hyperlipidemia 09/25/2016  . Neuropathy 09/25/2016  . Statin intolerance 09/25/2016  . Anxiety 09/25/2016  . Palpitations 09/25/2016    Past Surgical History:  Procedure Laterality Date  . BACK SURGERY    . MOHS SURGERY      Prior to Admission medications   Medication Sig Start Date End Date Taking? Authorizing Provider  acetaminophen (TYLENOL) 500 MG tablet Take by mouth.    [provider]  celecoxib (CELEBREX) 200 MG capsule Take 200 mg by mouth daily. 07/14/16   [provider]  Cholecalciferol (VITAMIN D3) 1.25 MG (50000 UT) CAPS Take by mouth.    [provider]  Cyanocobalamin (B-12) 1000 MCG CAPS Take by mouth.    [provider]  hydrochlorothiazide (HYDRODIURIL) 25 MG tablet Take 25 mg by mouth daily. 07/02/19   [provider]  potassium chloride (KLOR-CON) 10 MEQ tablet Take 10 mEq by mouth daily. 04/27/19    [provider]    Allergies Codeine, Morphine and related, and Percocet [oxycodone-acetaminophen]  Family History  Problem Relation Age of Onset  . Hypertension Father     Social History Social History   Tobacco Use  . Smoking status: Never Smoker  . Smokeless tobacco: Never Used  Substance Use Topics  . Alcohol use: Yes    Comment: Occassionally  . Drug use: No    Review of Systems  Constitutional: No fever/chills Eyes: No visual changes. ENT: No sore throat. Respiratory: Denies cough Cardiovascular: Denies chest pain Genitourinary: Negative for dysuria. Musculoskeletal: Negative for back pain. Skin: Negative for rash. Psychiatric: no mood changes,     ____________________________________________   PHYSICAL EXAM:  VITAL SIGNS: ED Triage Vitals  Enc Vitals Group     BP 02/11/20 1008 (!) 197/76     Pulse Rate 02/11/20 1008 84     Resp 02/11/20 1008 16     Temp 02/11/20 1008 98 F (36.7 C)     Temp Source 02/11/20 1008 Oral     SpO2 02/11/20 1008 95 %     Weight 02/11/20 1002 139 lb 15.9 oz (63.5 kg)     Height 02/11/20 1002 5\' 4"  (1.626 m)     Head Circumference --      Peak Flow --      Pain Score 02/11/20 1002 8     Pain Loc --  Pain Edu? --      Excl. in [place]? --     Constitutional: Alert and oriented. Well appearing and in no acute distress. Eyes: Conjunctivae are normal.  Head: Multiple abrasions noted to the forehead nose and cheeks. Nose: No congestion/rhinnorhea. Mouth/Throat: Mucous membranes are moist.   Neck:  supple no lymphadenopathy noted Cardiovascular: Normal rate, regular rhythm. Heart sounds are normal Respiratory: Normal respiratory effort.  No retractions, lungs c t a  GU: deferred Musculoskeletal: FROM all extremities, warm and well perfused, C-spine very tender to palpation Neurologic:  Normal speech and language.  Skin:  Skin is warm, dry  No rash noted. Psychiatric: Mood and affect are normal. Speech and  behavior are normal.  ____________________________________________   LABS (all labs ordered are listed, but only abnormal results are displayed)  Labs Reviewed  COMPREHENSIVE METABOLIC PANEL - Abnormal; Notable for the following components:      Result Value   Glucose, Bld 103 (*)    BUN 24 (*)    All other components within normal limits  RESP PANEL BY RT-PCR (FLU A&B, COVID) ARPGX2  CBC WITH DIFFERENTIAL/PLATELET  CBC  CREATININE, SERUM  COMPREHENSIVE METABOLIC PANEL  CBC  TROPONIN I (HIGH SENSITIVITY)  TROPONIN I (HIGH SENSITIVITY)   ____________________________________________   ____________________________________________  RADIOLOGY  CT of the head and C-spine are negative for any acute abnormality MRI of the C-spine  ____________________________________________   PROCEDURES  Procedure(s) performed: No  Procedures    ____________________________________________   INITIAL IMPRESSION / ASSESSMENT AND PLAN / ED COURSE  Pertinent labs & imaging results that were available during my care of the patient were reviewed by me and considered in my medical decision making (see chart for details).   Patient is a 86 year old female presents emergency department for a fall.  See HPI.  Physical exam shows patient have multiple abrasions across front of her face with no active bleeding.  C-spine is very tender to palpation.  Remainder exams unremarkable  CT of the head and C-spine are both negative for any acute abnormality  Due to the patient's continued discomfort and severe pain with movement of her neck I do feel due to her age we will do an MRI.  Patient was placed in a soft c-collar. Pain medication offered and given with Zofran to offset any nausea that she usually has with narcotics.    ----------------------------------------- 12:20 PM on 02/11/2020 -----------------------------------------  On recheck of the blood pressure the nursing staff told me her  blood pressure did increase to 204/79.  Patient is complaining of being dizzy.  They feel this warrants more admitted intensive work-up.  Ordered EKG, lab work including metabolic panel, CBC and troponin.   ----------------------------------------- 4:34 PM on 02/11/2020 ----------------------------------------- CBC, comprehensive metabolic panel are basically normal, troponin is normal, Covid test pending  EKG did show a new right bundle branch block when compared to the old EKG.  Radiologist did call and we spoke about the MRI.  MRI is as below MRI of the C-spine and brain show a dens and C1 fracture. MRI  of the brain does not show any acute abnormality  I did explain the findings to the patient.  I did consult neurosurgery by speaking with Dr. Lacinda Axon.  He instructed me to place her in a collar and if able to be discharged follow-up in his office.  However the patient's pain has not been adequately fully controlled.  I feel that she would benefit from being hospitalized.  There are  also social issues to consider as her husband is home sick with Covid and she would have no one to help her.  Hospitalist agrees for admission  Also blood pressure has not been adequately controlled, we gave her labetalol 5 mg IV and will need to add an additional 5 mg IV.  Patient is admitted in stable condition   Sherry [name] was evaluated in Emergency Department on 02/11/2020 for the symptoms described in the history of present illness. She was evaluated in the context of the global COVID-19 pandemic, which necessitated consideration that the patient might be at risk for infection with the SARS-CoV-2 virus that causes COVID-19. Institutional protocols and algorithms that pertain to the evaluation of patients at risk for COVID-19 are in a state of rapid change based on information released by regulatory bodies including the CDC and federal and state organizations. These policies and algorithms were followed  during the patient's care in the ED.    As part of my medical decision making, I reviewed the following data within the [place] notes reviewed and incorporated, Labs reviewed , EKG interpreted RBBB, see physician read, Old EKG reviewed, RBBB is a new finding compared to the old EKG, Old chart reviewed, Radiograph reviewed , Discussed with admitting physician , Discussed with radiologist, A consult was requested and obtained from this/these consultant(s) Neurosurgery, Notes from prior ED visits and [place] Controlled Substance Database  ____________________________________________   FINAL CLINICAL IMPRESSION(S) / ED DIAGNOSES  Final diagnoses:  Dens fracture, closed, initial encounter ([place])  Closed nondisplaced fracture of first cervical vertebra, unspecified fracture morphology, initial encounter ([place])  Inadequate pain control  Fall, initial encounter  Dizziness  Right bundle branch block (RBBB) on electrocardiogram (ECG)      NEW MEDICATIONS STARTED DURING THIS VISIT:  New Prescriptions   No medications on file     Note:  This document was prepared using Dragon voice recognition software and may include unintentional dictation errors.    Versie Starks, PA-C 02/11/20 Judithann Sheen, MD 02/12/20 [phone]

## 2020-02-11 NOTE — H&P (Addendum)
History and Physical    Sherry [name] LFY:[id number] DOB: 1931/01/16 DOA: 02/11/2020  PCP: Rusty Aus, MD   Patient coming from:  Home  I have personally briefly reviewed patient's old medical records in [facility].  Chief Complaint: Status post mechanical fall with C1 spine fracture.  HPI: Sherry [name] is a 86 y.o. female with medical history significant of hyperlipidemia, statin intolerance, neuropathy, anxiety, presented  to the emergency department status post fall.  Patient reports she uses walker for ambulation and today at home,  her walker caught under the rug and she fell forward and hit the head. She has developed severe neck pain following fall.  She denies any loss of consciousness, headache, nausea, vomiting after the fall.  She also denies any weakness or numbness in the arms.  She described pain as 10 /10 on pain scale,  Describes it  as throbbing nonradiating.  ED Course: She was very hypertensive other vitals were stable. Labs include: 42, potassium 3.7, chloride 104, bicarb 27, glucose 103, BUN 24, creatinine 0.51, calcium 9.7, anion gap 11, alkaline phosphatase 55, AST 22, ALT 17, total bilirubin 0.7, troponin is 7, WBC 7.7, hemoglobin 13.4, hematocrit 39.8, platelet 217, influenza negative, Covid negative. CT C spine: nondisplaced fracture through the dens. Nondisplaced fractures of the posterior arch of C1 bilaterally also noted. MRI Brain: No evidence of acute infarction, hemorrhage, or mass. MRI C-spine : Acute type 2 dens fracture with mild posterior angulation. Chronic infarcts and chronic microvascular ischemic changes.   Review of Systems:  Review of Systems  Constitutional: Negative.   HENT: Negative.   Eyes: Negative.   Respiratory: Negative.   Cardiovascular: Negative.   Gastrointestinal: Negative.   Genitourinary: Negative.   Musculoskeletal: Positive for back pain, falls, joint pain and neck pain.  Skin: Negative.   Neurological:  Negative.   Endo/Heme/Allergies: Negative.   Psychiatric/Behavioral: Negative.    Past Medical History:  Diagnosis Date  . Actinic keratosis   . Hypertension     Past Surgical History:  Procedure Laterality Date  . BACK SURGERY    . MOHS SURGERY       reports that she has never smoked. She has never used smokeless tobacco. She reports current alcohol use. She reports that she does not use drugs.  Allergies  Allergen Reactions  . Codeine   . Morphine And Related Other (See Comments)    Respiratory Distress   . Percocet [Oxycodone-Acetaminophen]     Family History  Problem Relation Age of Onset  . Hypertension Father     Family history reviewed and not pertinent   Prior to Admission medications   Medication Sig Start Date End Date Taking? Authorizing Provider  acetaminophen (TYLENOL) 500 MG tablet Take by mouth.    [provider]  celecoxib (CELEBREX) 200 MG capsule Take 200 mg by mouth daily. 07/14/16   [provider]  Cholecalciferol (VITAMIN D3) 1.25 MG (50000 UT) CAPS Take by mouth.    [provider]  Cyanocobalamin (B-12) 1000 MCG CAPS Take by mouth.    [provider]  hydrochlorothiazide (HYDRODIURIL) 25 MG tablet Take 25 mg by mouth daily. 07/02/19   [provider]  potassium chloride (KLOR-CON) 10 MEQ tablet Take 10 mEq by mouth daily. 04/27/19   [provider]    Physical Exam: Vitals:   02/11/20 1600 02/11/20 1615 02/11/20 1630 02/11/20 1645  BP: (!) 214/91 (!) 202/128 (!) 232/97 (!) 215/92  Pulse: 90  Resp: 19 13 16 13   Temp:      TempSrc:      SpO2: 97%     Weight:      Height:        Constitutional: NAD, calm, comfortable Vitals:   02/11/20 1600 02/11/20 1615 02/11/20 1630 02/11/20 1645  BP: (!) 214/91 (!) 202/128 (!) 232/97 (!) 215/92  Pulse: 90     Resp: 19 13 16 13   Temp:      TempSrc:      SpO2: 97%     Weight:      Height:       Eyes: PERRL, lids and conjunctivae  normal ENMT: Mucous membranes are moist. Posterior pharynx clear of any exudate or lesions.Normal dentition.  Neck: She remains in neck collar,  reports severe tenderness on neck movements. Respiratory: clear to auscultation bilaterally, no wheezing, no crackles. Normal respiratory effort. No accessory muscle use.  Cardiovascular: Regular rate and rhythm, no murmurs / rubs / gallops. No extremity edema. 2+ pedal pulses. No carotid bruits.  Abdomen: no tenderness, no masses palpated. No hepatosplenomegaly. Bowel sounds positive.  Musculoskeletal: no clubbing / cyanosis. No joint deformity upper and lower extremities. Good ROM, no contractures. Normal muscle tone.  Skin: no rashes, lesions, ulcers. No induration Neurologic: CN 2-12 grossly intact. Sensation intact, DTR normal. Strength 5/5 in all 4.  Psychiatric: Normal judgment and insight. Alert and oriented x 3. Normal mood.    Labs on Admission: I have personally reviewed following labs and imaging studies  CBC: Recent Labs  Lab 02/11/20 1242  WBC 7.7  NEUTROABS 6.0  HGB 13.4  HCT 39.8  MCV 87.9  PLT 941   Basic Metabolic Panel: Recent Labs  Lab 02/11/20 1242  NA 142  K 3.7  CL 104  CO2 27  GLUCOSE 103*  BUN 24*  CREATININE 0.51  CALCIUM 9.7   GFR: Estimated Creatinine Clearance: 42.8 mL/min (by C-G formula based on SCr of 0.51 mg/dL). Liver Function Tests: Recent Labs  Lab 02/11/20 1242  AST 22  ALT 17  ALKPHOS 55  BILITOT 0.7  PROT 7.0  ALBUMIN 3.9   No results for input(s): LIPASE, AMYLASE in the last 168 hours. No results for input(s): AMMONIA in the last 168 hours. Coagulation Profile: No results for input(s): INR, PROTIME in the last 168 hours. Cardiac Enzymes: No results for input(s): CKTOTAL, CKMB, CKMBINDEX, TROPONINI in the last 168 hours. BNP (last 3 results) No results for input(s): PROBNP in the last 8760 hours. HbA1C: No results for input(s): HGBA1C in the last 72 hours. CBG: No results  for input(s): GLUCAP in the last 168 hours. Lipid Profile: No results for input(s): CHOL, HDL, LDLCALC, TRIG, CHOLHDL, LDLDIRECT in the last 72 hours. Thyroid Function Tests: No results for input(s): TSH, T4TOTAL, FREET4, T3FREE, THYROIDAB in the last 72 hours. Anemia Panel: No results for input(s): VITAMINB12, FOLATE, FERRITIN, TIBC, IRON, RETICCTPCT in the last 72 hours. Urine analysis:    Component Value Date/Time   COLORURINE YELLOW (A) 12/16/2014 0138   APPEARANCEUR CLEAR (A) 12/16/2014 0138   LABSPEC 1.010 12/16/2014 0138   PHURINE 6.0 12/16/2014 0138   GLUCOSEU NEGATIVE 12/16/2014 0138   HGBUR NEGATIVE 12/16/2014 0138   BILIRUBINUR NEGATIVE 12/16/2014 0138   KETONESUR TRACE (A) 12/16/2014 0138   PROTEINUR NEGATIVE 12/16/2014 0138   NITRITE NEGATIVE 12/16/2014 0138   LEUKOCYTESUR NEGATIVE 12/16/2014 0138    Radiological Exams on Admission: CT Head Wo Contrast  Addendum Date: 02/11/2020   ADDENDUM  REPORT: 02/11/2020 16:02 ADDENDUM: After further review, there is a nondisplaced fracture through the dens. Nondisplaced fractures of the posterior arch of C1 bilaterally also noted. These results were called by telephone at the time of interpretation on 02/11/2020 at 4:01 pm to provider Quentin Cornwall, who verbally acknowledged these results. Electronically Signed   By: Franchot Gallo M.D.   On: 02/11/2020 16:02   Result Date: 02/11/2020 CLINICAL DATA:  Fall at home.  Head injury. EXAM: CT HEAD WITHOUT CONTRAST CT CERVICAL SPINE WITHOUT CONTRAST TECHNIQUE: Multidetector CT imaging of the head and cervical spine was performed following the standard protocol without intravenous contrast. Multiplanar CT image reconstructions of the cervical spine were also generated. COMPARISON:  CT head 07/06/2019 FINDINGS: CT HEAD FINDINGS Brain: Generalized atrophy unchanged. Negative for hydrocephalus. Chronic microvascular ischemic changes in the white matter unchanged. Chronic lacunar infarction head of  caudate on the left unchanged. Negative for acute infarct, hemorrhage, mass. Vascular: Negative for hyperdense vessel Skull: Nondisplaced left occipital bone fracture unchanged from the prior CT. No acute fracture. Sinuses/Orbits: Paranasal sinuses clear. Bilateral cataract extraction. Other: None CT CERVICAL SPINE FINDINGS Alignment: Mild anterolisthesis C2-3 and C3-4. 3 mm anterolisthesis C4-5. Mild anterolisthesis C7-T1 and T1-2. Skull base and vertebrae: Negative for fracture Soft tissues and spinal canal: Negative Disc levels: Multilevel disc and facet degeneration. Prominent degenerative change and spurring throughout the cervical spine. Multilevel foraminal stenosis due to spurring. Upper chest: Lung apices clear bilaterally. Other: None IMPRESSION: 1. No acute intracranial abnormality. Atrophy and chronic microvascular ischemic change stable 2. Negative for cervical spine fracture. Prominent cervical spondylosis 3. Chronic fracture left occipital bone unchanged. Electronically Signed: By: Franchot Gallo M.D. On: 02/11/2020 10:42   CT Cervical Spine Wo Contrast  Addendum Date: 02/11/2020   ADDENDUM REPORT: 02/11/2020 16:02 ADDENDUM: After further review, there is a nondisplaced fracture through the dens. Nondisplaced fractures of the posterior arch of C1 bilaterally also noted. These results were called by telephone at the time of interpretation on 02/11/2020 at 4:01 pm to provider Quentin Cornwall, who verbally acknowledged these results. Electronically Signed   By: Franchot Gallo M.D.   On: 02/11/2020 16:02   Result Date: 02/11/2020 CLINICAL DATA:  Fall at home.  Head injury. EXAM: CT HEAD WITHOUT CONTRAST CT CERVICAL SPINE WITHOUT CONTRAST TECHNIQUE: Multidetector CT imaging of the head and cervical spine was performed following the standard protocol without intravenous contrast. Multiplanar CT image reconstructions of the cervical spine were also generated. COMPARISON:  CT head 07/06/2019 FINDINGS: CT  HEAD FINDINGS Brain: Generalized atrophy unchanged. Negative for hydrocephalus. Chronic microvascular ischemic changes in the white matter unchanged. Chronic lacunar infarction head of caudate on the left unchanged. Negative for acute infarct, hemorrhage, mass. Vascular: Negative for hyperdense vessel Skull: Nondisplaced left occipital bone fracture unchanged from the prior CT. No acute fracture. Sinuses/Orbits: Paranasal sinuses clear. Bilateral cataract extraction. Other: None CT CERVICAL SPINE FINDINGS Alignment: Mild anterolisthesis C2-3 and C3-4. 3 mm anterolisthesis C4-5. Mild anterolisthesis C7-T1 and T1-2. Skull base and vertebrae: Negative for fracture Soft tissues and spinal canal: Negative Disc levels: Multilevel disc and facet degeneration. Prominent degenerative change and spurring throughout the cervical spine. Multilevel foraminal stenosis due to spurring. Upper chest: Lung apices clear bilaterally. Other: None IMPRESSION: 1. No acute intracranial abnormality. Atrophy and chronic microvascular ischemic change stable 2. Negative for cervical spine fracture. Prominent cervical spondylosis 3. Chronic fracture left occipital bone unchanged. Electronically Signed: By: Franchot Gallo M.D. On: 02/11/2020 10:42   MR Brain W and Wo Contrast  Result Date: 02/11/2020 CLINICAL DATA:  Fall, dizziness EXAM: MRI HEAD WITHOUT AND WITH CONTRAST TECHNIQUE: Multiplanar, multiecho pulse sequences of the brain and surrounding structures were obtained without and with intravenous contrast. CONTRAST:  7mL GADAVIST GADOBUTROL 1 MMOL/ML IV SOLN COMPARISON:  None. FINDINGS: Brain: There is no acute infarction or intracranial hemorrhage. There is no intracranial mass, mass effect, or edema. There is no hydrocephalus or extra-axial fluid collection. Prominence of the ventricles and sulci reflects mild generalized parenchymal volume loss. There are small chronic infarcts of the basal ganglia bilaterally and left cerebellum.  Anterior right frontal encephalomalacia. Additional patchy and confluent areas of T2 hyperintensity in the supratentorial white matter are nonspecific but probably reflect mild to moderate chronic microvascular ischemic changes. No abnormal enhancement. Vascular: Major vessel flow voids at the skull base are preserved. Skull and upper cervical spine: Normal marrow signal is preserved. Sinuses/Orbits: Minor mucosal thickening. Bilateral lens replacements. Other: Sella is unremarkable.  Mastoid air cells are clear. IMPRESSION: No evidence of acute infarction, hemorrhage, or mass. Chronic infarcts and chronic microvascular ischemic changes. Electronically Signed   By: Macy Mis M.D.   On: 02/11/2020 16:06   MR Cervical Spine Wo Contrast  Result Date: 02/11/2020 CLINICAL DATA:  Fall, negative CT EXAM: MRI CERVICAL SPINE WITHOUT CONTRAST TECHNIQUE: Multiplanar, multisequence MR imaging of the cervical spine was performed. No intravenous contrast was administered. COMPARISON:  None. FINDINGS: Alignment: Mild multilevel degenerative listhesis including anterolisthesis at C2-C3, C3-C4, C6-C7, and C7-T1. Vertebrae: There is a STIR hyperintense fracture cleft through the dens. Mild posterior angulation of the dens. Fluid signal is present at the atlantodental articulation on the sagittal view. This is not covered on axial imaging. There appears to be STIR hyperintensity along the fracture lines of the C1 posterior posterior arch. Cord: No abnormal signal. Posterior Fossa, vertebral arteries, paraspinal tissues: Mild prevertebral soft tissue swelling at C1 and C2 levels. Disc levels: C2-C3: Minimal disc bulge with small endplate osteophytes. Facet hypertrophy. No canal stenosis. Minor foraminal stenosis. C3-C4: Disc bulge with endplate osteophytes and facet and uncovertebral hypertrophy. No canal stenosis. Moderate to marked foraminal stenosis. C4-C5: Disc bulge with endplate osteophytes and left greater than right  uncovertebral and facet hypertrophy. No canal or right foraminal stenosis. Moderate to marked left foraminal stenosis. C5-C6: Disc osteophyte complex with uncovertebral and left greater than right facet hypertrophy. Mild canal stenosis. Moderate to marked right and marked left foraminal stenosis. C6-C7: Disc osteophyte complex with uncovertebral greater than facet hypertrophy. Mild canal stenosis. Moderate to marked foraminal stenosis. C7-T1:  Facet hypertrophy.  No canal or foraminal stenosis. IMPRESSION: Acute type 2 dens fracture with mild posterior angulation. Fluid is present at the atlantodental articulation, which is only covered on sagittal imaging. Suspected STIR hyperintensity along bilateral nondisplaced C1 posterior arch fracture clefts suggesting acuity. Multilevel degenerative changes. No high-grade canal stenosis. Multilevel foraminal narrowing. These results were called by telephone at the time of interpretation on 02/11/2020 at 3:45 pm to provider Ashok Cordia , who verbally acknowledged these results. Electronically Signed   By: Macy Mis M.D.   On: 02/11/2020 15:57    EKG: Independently reviewed. Normal sinus rhythm with sinus arrhythmia, Right bundle branch block Left anterior fascicular block.  Assessment/Plan Principal Problem:   Neck fracture (HCC) Active Problems:   Hyperlipidemia   Neuropathy   Statin intolerance   Anxiety    Neck pain secondary to C1 spine fracture s/p mechanical fall. Patient reports mechanical fall at home earlier today,  developed severe neck  pain CT and MRI consistent with C1 spine fracture. Patient denies any neuropathy, weakness or numbness in the arms. Neurosurgery Dr. Lacinda Axon was consulted by ED physician. He recommended patient would be a poor candidate for neurosurgical intervention given age. He advised pain control, overnight observation and PT and OT evaluation. Dr. Lacinda Axon will see the patient in the morning. Adequate pain control,  hydrocodone 5 mg every 6 hours as needed Patient reports allergies to oxycodone and morphine. Patient received hydrocodone, fentanyl in the ED, no allergic reaction noted today. Continue neck collar.  Hypertension Continue hydrochlorothiazide. Hydralazine as needed.  Anxiety: Patient reported history of anxiety but not taking any anxiety medication at this time.   DVT prophylaxis: Lovenox Code Status: Full code Family Communication: No family at bedside  disposition Plan: Home Consults called: Neurosurgery Dr. Lacinda Axon Admission status: Observation   Shawna Clamp MD [facility]   If 7PM-7AM, please contact night-coverage [email or web address]   02/11/2020, 5:52 PM

## 2020-02-11 NOTE — ED Notes (Signed)
Patient transported to MRI 

## 2020-02-11 NOTE — ED Notes (Signed)
Pt refusing to wear c-collar, states it increases her neck pain

## 2020-02-11 NOTE — ED Notes (Signed)
Receiving RN messaged per handoff

## 2020-02-11 NOTE — ED Notes (Signed)
Pt placed on purewick 

## 2020-02-11 NOTE — ED Triage Notes (Signed)
Tripped over rug at home and fell.  Hitting right side of head.  C/O neck pain.  Hard c-collar placed on patient.

## 2020-02-11 NOTE — ED Notes (Signed)
Pt reporting dizziness, also hypertensive. Provider notified

## 2020-02-11 NOTE — ED Notes (Signed)
[place] j collar placed on neck by this rn and pa-c cuthriell

## 2020-02-11 NOTE — ED Notes (Signed)
ED TO INPATIENT HANDOFF REPORT  ED Nurse Name and Phone #: Sherrol Vicars [phone]  S Name/Age/Gender Sherry [name] 86 y.o. female Room/Bed: ED44A/ED44A  Code Status   Code Status: Full Code  Home/SNF/Other Home Patient oriented to: self, place, time and situation Is this baseline? Yes   Triage Complete: Triage complete  Chief Complaint Neck fracture ([place]) [S12.9XXA]  Triage Note Tripped over rug at home and fell.  Hitting right side of head.  C/O neck pain.  Hard c-collar placed on patient.    Allergies Allergies  Allergen Reactions  . Codeine   . Morphine And Related Other (See Comments)    Respiratory Distress   . Percocet [Oxycodone-Acetaminophen]     Level of Care/Admitting Diagnosis ED Disposition    ED Disposition Condition [facility] Area: [place] [[id number]]  Level of Care: Med-Surg [16]  Covid Evaluation: Asymptomatic Screening Protocol (No Symptoms)  Diagnosis: Neck fracture [facility]) [[id number]]  Admitting Physician: Tarry Kos  Attending Physician: Tarry Kos       B Medical/Surgery History Past Medical History:  Diagnosis Date  . Actinic keratosis   . Hypertension    Past Surgical History:  Procedure Laterality Date  . BACK SURGERY    . MOHS SURGERY       A IV Location/Drains/Wounds Patient Lines/Drains/Airways Status    Active Line/Drains/Airways    Name Placement date Placement time Site Days   Peripheral IV 02/11/20 Left Antecubital 02/11/20  1341  Antecubital  less than 1          Intake/Output Last 24 hours No intake or output data in the 24 hours ending 02/11/20 1822  Labs/Imaging Results for orders placed or performed during the hospital encounter of 02/11/20 (from the past 48 hour(s))  Comprehensive metabolic panel     Status: Abnormal   Collection Time: 02/11/20 12:42 PM  Result Value Ref Range   Sodium 142 135 - 145 mmol/L   Potassium 3.7 3.5 - 5.1 mmol/L    Chloride 104 98 - 111 mmol/L   CO2 27 22 - 32 mmol/L   Glucose, Bld 103 (H) 70 - 99 mg/dL    Comment: Glucose reference range applies only to samples taken after fasting for at least 8 hours.   BUN 24 (H) 8 - 23 mg/dL   Creatinine, Ser 0.51 0.44 - 1.00 mg/dL   Calcium 9.7 8.9 - 10.3 mg/dL   Total Protein 7.0 6.5 - 8.1 g/dL   Albumin 3.9 3.5 - 5.0 g/dL   AST 22 15 - 41 U/L   ALT 17 0 - 44 U/L   Alkaline Phosphatase 55 38 - 126 U/L   Total Bilirubin 0.7 0.3 - 1.2 mg/dL   GFR, Estimated >60 >60 mL/min    Comment: (NOTE) Calculated using the CKD-EPI Creatinine Equation (2021)    Anion gap 11 5 - 15    Comment: Performed at [facility], [address] [place], [place] [address]  CBC with Differential     Status: None   Collection Time: 02/11/20 12:42 PM  Result Value Ref Range   WBC 7.7 4.0 - 10.5 K/uL   RBC 4.53 3.87 - 5.11 MIL/uL   Hemoglobin 13.4 12.0 - 15.0 g/dL   HCT 39.8 36.0 - 46.0 %   MCV 87.9 80.0 - 100.0 fL   MCH 29.6 26.0 - 34.0 pg   MCHC 33.7 30.0 - 36.0 g/dL   RDW 13.8 11.5 - 15.5 %  Platelets 217 150 - 400 K/uL   nRBC 0.0 0.0 - 0.2 %   Neutrophils Relative % 78 %   Neutro Abs 6.0 1.7 - 7.7 K/uL   Lymphocytes Relative 14 %   Lymphs Abs 1.0 0.7 - 4.0 K/uL   Monocytes Relative 7 %   Monocytes Absolute 0.6 0.1 - 1.0 K/uL   Eosinophils Relative 0 %   Eosinophils Absolute 0.0 0.0 - 0.5 K/uL   Basophils Relative 1 %   Basophils Absolute 0.0 0.0 - 0.1 K/uL   Immature Granulocytes 0 %   Abs Immature Granulocytes 0.03 0.00 - 0.07 K/uL    Comment: Performed at [facility], [place], [place] [address]  Troponin I (High Sensitivity)     Status: None   Collection Time: 02/11/20 12:42 PM  Result Value Ref Range   Troponin I (High Sensitivity) 7 <18 ng/L    Comment: (NOTE) Elevated high sensitivity troponin I (hsTnI) values and significant  changes across serial measurements may suggest ACS but many other  chronic and acute  conditions are known to elevate hsTnI results.  Refer to the "Links" section for chest pain algorithms and additional  guidance. Performed at [facility], [address] [place], [place] [address]   Resp Panel by RT-PCR (Flu A&B, Covid) Nasopharyngeal Swab     Status: None   Collection Time: 02/11/20  4:11 PM   Specimen: Nasopharyngeal Swab; Nasopharyngeal(NP) swabs in vial transport medium  Result Value Ref Range   SARS Coronavirus 2 by RT PCR NEGATIVE NEGATIVE    Comment: (NOTE) SARS-CoV-2 target nucleic acids are NOT DETECTED.  The SARS-CoV-2 RNA is generally detectable in upper respiratory specimens during the acute phase of infection. The lowest concentration of SARS-CoV-2 viral copies this assay can detect is 138 copies/mL. A negative result does not preclude SARS-Cov-2 infection and should not be used as the sole basis for treatment or other patient management decisions. A negative result may occur with  improper specimen collection/handling, submission of specimen other than nasopharyngeal swab, presence of viral mutation(s) within the areas targeted by this assay, and inadequate number of viral copies(<138 copies/mL). A negative result must be combined with clinical observations, patient history, and epidemiological information. The expected result is Negative.  Fact Sheet for Patients:  EntrepreneurPulse.com.au  Fact Sheet for Healthcare Providers:  IncredibleEmployment.be  This test is no t yet approved or cleared by the [place] FDA and  has been authorized for detection and/or diagnosis of SARS-CoV-2 by FDA under an Emergency Use Authorization (EUA). This EUA will remain  in effect (meaning this test can be used) for the duration of the COVID-19 declaration under Section 564(b)(1) of the Act, 21 U.S.C.section 360bbb-3(b)(1), unless the authorization is terminated  or revoked sooner.       Influenza A by PCR  NEGATIVE NEGATIVE   Influenza B by PCR NEGATIVE NEGATIVE    Comment: (NOTE) The Xpert Xpress SARS-CoV-2/FLU/RSV plus assay is intended as an aid in the diagnosis of influenza from Nasopharyngeal swab specimens and should not be used as a sole basis for treatment. Nasal washings and aspirates are unacceptable for Xpert Xpress SARS-CoV-2/FLU/RSV testing.  Fact Sheet for Patients: EntrepreneurPulse.com.au  Fact Sheet for Healthcare Providers: IncredibleEmployment.be  This test is not yet approved or cleared by the [place] FDA and has been authorized for detection and/or diagnosis of SARS-CoV-2 by FDA under an Emergency Use Authorization (EUA). This EUA will remain in effect (meaning this test can be  used) for the duration of the COVID-19 declaration under Section 564(b)(1) of the Act, 21 U.S.C. section 360bbb-3(b)(1), unless the authorization is terminated or revoked.  Performed at [facility], [address] [place], [place] [address]    CT Head Wo Contrast  Addendum Date: 02/11/2020   ADDENDUM REPORT: 02/11/2020 16:02 ADDENDUM: After further review, there is a nondisplaced fracture through the dens. Nondisplaced fractures of the posterior arch of C1 bilaterally also noted. These results were called by telephone at the time of interpretation on 02/11/2020 at 4:01 pm to provider Quentin Cornwall, who verbally acknowledged these results. Electronically Signed   By: Franchot Gallo M.D.   On: 02/11/2020 16:02   Result Date: 02/11/2020 CLINICAL DATA:  Fall at home.  Head injury. EXAM: CT HEAD WITHOUT CONTRAST CT CERVICAL SPINE WITHOUT CONTRAST TECHNIQUE: Multidetector CT imaging of the head and cervical spine was performed following the standard protocol without intravenous contrast. Multiplanar CT image reconstructions of the cervical spine were also generated. COMPARISON:  CT head 07/06/2019 FINDINGS: CT HEAD FINDINGS Brain: Generalized atrophy  unchanged. Negative for hydrocephalus. Chronic microvascular ischemic changes in the white matter unchanged. Chronic lacunar infarction head of caudate on the left unchanged. Negative for acute infarct, hemorrhage, mass. Vascular: Negative for hyperdense vessel Skull: Nondisplaced left occipital bone fracture unchanged from the prior CT. No acute fracture. Sinuses/Orbits: Paranasal sinuses clear. Bilateral cataract extraction. Other: None CT CERVICAL SPINE FINDINGS Alignment: Mild anterolisthesis C2-3 and C3-4. 3 mm anterolisthesis C4-5. Mild anterolisthesis C7-T1 and T1-2. Skull base and vertebrae: Negative for fracture Soft tissues and spinal canal: Negative Disc levels: Multilevel disc and facet degeneration. Prominent degenerative change and spurring throughout the cervical spine. Multilevel foraminal stenosis due to spurring. Upper chest: Lung apices clear bilaterally. Other: None IMPRESSION: 1. No acute intracranial abnormality. Atrophy and chronic microvascular ischemic change stable 2. Negative for cervical spine fracture. Prominent cervical spondylosis 3. Chronic fracture left occipital bone unchanged. Electronically Signed: By: Franchot Gallo M.D. On: 02/11/2020 10:42   CT Cervical Spine Wo Contrast  Addendum Date: 02/11/2020   ADDENDUM REPORT: 02/11/2020 16:02 ADDENDUM: After further review, there is a nondisplaced fracture through the dens. Nondisplaced fractures of the posterior arch of C1 bilaterally also noted. These results were called by telephone at the time of interpretation on 02/11/2020 at 4:01 pm to provider Quentin Cornwall, who verbally acknowledged these results. Electronically Signed   By: Franchot Gallo M.D.   On: 02/11/2020 16:02   Result Date: 02/11/2020 CLINICAL DATA:  Fall at home.  Head injury. EXAM: CT HEAD WITHOUT CONTRAST CT CERVICAL SPINE WITHOUT CONTRAST TECHNIQUE: Multidetector CT imaging of the head and cervical spine was performed following the standard protocol without  intravenous contrast. Multiplanar CT image reconstructions of the cervical spine were also generated. COMPARISON:  CT head 07/06/2019 FINDINGS: CT HEAD FINDINGS Brain: Generalized atrophy unchanged. Negative for hydrocephalus. Chronic microvascular ischemic changes in the white matter unchanged. Chronic lacunar infarction head of caudate on the left unchanged. Negative for acute infarct, hemorrhage, mass. Vascular: Negative for hyperdense vessel Skull: Nondisplaced left occipital bone fracture unchanged from the prior CT. No acute fracture. Sinuses/Orbits: Paranasal sinuses clear. Bilateral cataract extraction. Other: None CT CERVICAL SPINE FINDINGS Alignment: Mild anterolisthesis C2-3 and C3-4. 3 mm anterolisthesis C4-5. Mild anterolisthesis C7-T1 and T1-2. Skull base and vertebrae: Negative for fracture Soft tissues and spinal canal: Negative Disc levels: Multilevel disc and facet degeneration. Prominent degenerative change and spurring throughout the cervical spine. Multilevel foraminal stenosis due to spurring. Upper chest: Lung  apices clear bilaterally. Other: None IMPRESSION: 1. No acute intracranial abnormality. Atrophy and chronic microvascular ischemic change stable 2. Negative for cervical spine fracture. Prominent cervical spondylosis 3. Chronic fracture left occipital bone unchanged. Electronically Signed: By: Franchot Gallo M.D. On: 02/11/2020 10:42   MR Brain W and Wo Contrast  Result Date: 02/11/2020 CLINICAL DATA:  Fall, dizziness EXAM: MRI HEAD WITHOUT AND WITH CONTRAST TECHNIQUE: Multiplanar, multiecho pulse sequences of the brain and surrounding structures were obtained without and with intravenous contrast. CONTRAST:  69mL GADAVIST GADOBUTROL 1 MMOL/ML IV SOLN COMPARISON:  None. FINDINGS: Brain: There is no acute infarction or intracranial hemorrhage. There is no intracranial mass, mass effect, or edema. There is no hydrocephalus or extra-axial fluid collection. Prominence of the ventricles  and sulci reflects mild generalized parenchymal volume loss. There are small chronic infarcts of the basal ganglia bilaterally and left cerebellum. Anterior right frontal encephalomalacia. Additional patchy and confluent areas of T2 hyperintensity in the supratentorial white matter are nonspecific but probably reflect mild to moderate chronic microvascular ischemic changes. No abnormal enhancement. Vascular: Major vessel flow voids at the skull base are preserved. Skull and upper cervical spine: Normal marrow signal is preserved. Sinuses/Orbits: Minor mucosal thickening. Bilateral lens replacements. Other: Sella is unremarkable.  Mastoid air cells are clear. IMPRESSION: No evidence of acute infarction, hemorrhage, or mass. Chronic infarcts and chronic microvascular ischemic changes. Electronically Signed   By: Macy Mis M.D.   On: 02/11/2020 16:06   MR Cervical Spine Wo Contrast  Result Date: 02/11/2020 CLINICAL DATA:  Fall, negative CT EXAM: MRI CERVICAL SPINE WITHOUT CONTRAST TECHNIQUE: Multiplanar, multisequence MR imaging of the cervical spine was performed. No intravenous contrast was administered. COMPARISON:  None. FINDINGS: Alignment: Mild multilevel degenerative listhesis including anterolisthesis at C2-C3, C3-C4, C6-C7, and C7-T1. Vertebrae: There is a STIR hyperintense fracture cleft through the dens. Mild posterior angulation of the dens. Fluid signal is present at the atlantodental articulation on the sagittal view. This is not covered on axial imaging. There appears to be STIR hyperintensity along the fracture lines of the C1 posterior posterior arch. Cord: No abnormal signal. Posterior Fossa, vertebral arteries, paraspinal tissues: Mild prevertebral soft tissue swelling at C1 and C2 levels. Disc levels: C2-C3: Minimal disc bulge with small endplate osteophytes. Facet hypertrophy. No canal stenosis. Minor foraminal stenosis. C3-C4: Disc bulge with endplate osteophytes and facet and  uncovertebral hypertrophy. No canal stenosis. Moderate to marked foraminal stenosis. C4-C5: Disc bulge with endplate osteophytes and left greater than right uncovertebral and facet hypertrophy. No canal or right foraminal stenosis. Moderate to marked left foraminal stenosis. C5-C6: Disc osteophyte complex with uncovertebral and left greater than right facet hypertrophy. Mild canal stenosis. Moderate to marked right and marked left foraminal stenosis. C6-C7: Disc osteophyte complex with uncovertebral greater than facet hypertrophy. Mild canal stenosis. Moderate to marked foraminal stenosis. C7-T1:  Facet hypertrophy.  No canal or foraminal stenosis. IMPRESSION: Acute type 2 dens fracture with mild posterior angulation. Fluid is present at the atlantodental articulation, which is only covered on sagittal imaging. Suspected STIR hyperintensity along bilateral nondisplaced C1 posterior arch fracture clefts suggesting acuity. Multilevel degenerative changes. No high-grade canal stenosis. Multilevel foraminal narrowing. These results were called by telephone at the time of interpretation on 02/11/2020 at 3:45 pm to provider Ashok Cordia , who verbally acknowledged these results. Electronically Signed   By: Macy Mis M.D.   On: 02/11/2020 15:57    Pending Labs Unresulted Labs (From admission, onward)  Start     Ordered   02/18/20 0500  Creatinine, serum  (enoxaparin (LOVENOX)    CrCl >/= 30 ml/min)  Weekly,   STAT     Comments: while on enoxaparin therapy    02/11/20 1655   02/12/20 0500  Comprehensive metabolic panel  Tomorrow morning,   STAT        02/11/20 1655   02/12/20 0500  CBC  Tomorrow morning,   STAT        02/11/20 1655   02/11/20 1652  CBC  (enoxaparin (LOVENOX)    CrCl >/= 30 ml/min)  Once,   STAT       Comments: Baseline for enoxaparin therapy IF NOT ALREADY DRAWN.  Notify MD if PLT < 100 K.    02/11/20 1655   02/11/20 1652  Creatinine, serum  (enoxaparin (LOVENOX)    CrCl >/=  30 ml/min)  Once,   STAT       Comments: Baseline for enoxaparin therapy IF NOT ALREADY DRAWN.    02/11/20 1655          Vitals/Pain Today's Vitals   02/11/20 1648 02/11/20 1730 02/11/20 1800 02/11/20 1815  BP:  (!) 184/89 (!) 188/72   Pulse:   89   Resp:  14 (!) 25 13  Temp:   97.9 F (36.6 C)   TempSrc:   Oral   SpO2:   98%   Weight:      Height:      PainSc: 9   7      Isolation Precautions No active isolations  Medications Medications  hydrochlorothiazide (HYDRODIURIL) tablet 25 mg (has no administration in time range)  enoxaparin (LOVENOX) injection 40 mg (has no administration in time range)  0.9 %  sodium chloride infusion (has no administration in time range)  acetaminophen (TYLENOL) tablet 650 mg (has no administration in time range)    Or  acetaminophen (TYLENOL) suppository 650 mg (has no administration in time range)  senna-docusate (Senokot-S) tablet 1 tablet (has no administration in time range)  ondansetron (ZOFRAN) tablet 4 mg (has no administration in time range)    Or  ondansetron (ZOFRAN) injection 4 mg (has no administration in time range)  hydrALAZINE (APRESOLINE) injection 10 mg (has no administration in time range)  HYDROcodone-acetaminophen (NORCO/VICODIN) 5-325 MG per tablet 1 tablet (has no administration in time range)  HYDROcodone-acetaminophen (NORCO/VICODIN) 5-325 MG per tablet 1 tablet (1 tablet Oral Given 02/11/20 1208)  ondansetron (ZOFRAN-ODT) disintegrating tablet 4 mg (4 mg Oral Given 02/11/20 1208)  labetalol (NORMODYNE) injection 5 mg (5 mg Intravenous Given 02/11/20 1347)  fentaNYL (SUBLIMAZE) injection 25 mcg (25 mcg Intravenous Given 02/11/20 1342)  gadobutrol (GADAVIST) 1 MMOL/ML injection 6 mL (6 mLs Intravenous Contrast Given 02/11/20 1509)  fentaNYL (SUBLIMAZE) injection 50 mcg (50 mcg Intravenous Given 02/11/20 1617)  ondansetron (ZOFRAN) injection 4 mg (4 mg Intravenous Given 02/11/20 1617)  labetalol (NORMODYNE) injection  5 mg (5 mg Intravenous Given 02/11/20 1643)  metoCLOPramide (REGLAN) injection 10 mg (10 mg Intravenous Given 02/11/20 1712)  fentaNYL (SUBLIMAZE) injection 50 mcg (50 mcg Intravenous Given 02/11/20 1712)    Mobility walks with person assist Low fall risk   Focused Assessments Neuro Assessment Handoff:  Swallow screen pass? Yes    NIH Stroke Scale ( + Modified Stroke Scale Criteria)  Interval: Initial Level of Consciousness (1a.)   : Alert, keenly responsive LOC Questions (1b. )   +: Answers both questions correctly LOC Commands (1c. )   + :  Performs both tasks correctly Best Gaze (2. )  +: Normal Visual (3. )  +: No visual loss Facial Palsy (4. )    : Normal symmetrical movements Motor Arm, Left (5a. )   +: No drift Motor Arm, Right (5b. )   +: No drift Motor Leg, Left (6a. )   +: No drift Motor Leg, Right (6b. )   +: No drift Limb Ataxia (7. ): Absent Sensory (8. )   +: Normal, no sensory loss Best Language (9. )   +: No aphasia Dysarthria (10. ): Normal Extinction/Inattention (11.)   +: No Abnormality Modified SS Total  +: 0 Complete NIHSS TOTAL: 0     Neuro Assessment:   Neuro Checks:   Initial (02/11/20 1330)  Last Documented NIHSS Modified Score: 0 (02/11/20 1330) Has TPA been given? No If patient is a Neuro Trauma and patient is going to OR before floor call report to [place] nurse: [phone] or [phone]     R Recommendations: See Admitting Provider Note  Report given to:   Additional Notes:

## 2020-02-12 DIAGNOSIS — S12001D Unspecified nondisplaced fracture of first cervical vertebra, subsequent encounter for fracture with routine healing: Secondary | ICD-10-CM | POA: Diagnosis not present

## 2020-02-12 DIAGNOSIS — S12001A Unspecified nondisplaced fracture of first cervical vertebra, initial encounter for closed fracture: Secondary | ICD-10-CM | POA: Diagnosis present

## 2020-02-12 DIAGNOSIS — I4510 Unspecified right bundle-branch block: Secondary | ICD-10-CM | POA: Diagnosis present

## 2020-02-12 DIAGNOSIS — Z885 Allergy status to narcotic agent status: Secondary | ICD-10-CM | POA: Diagnosis not present

## 2020-02-12 DIAGNOSIS — E876 Hypokalemia: Secondary | ICD-10-CM | POA: Diagnosis present

## 2020-02-12 DIAGNOSIS — Z791 Long term (current) use of non-steroidal anti-inflammatories (NSAID): Secondary | ICD-10-CM | POA: Diagnosis not present

## 2020-02-12 DIAGNOSIS — M1712 Unilateral primary osteoarthritis, left knee: Secondary | ICD-10-CM | POA: Diagnosis present

## 2020-02-12 DIAGNOSIS — Y92019 Unspecified place in single-family (private) house as the place of occurrence of the external cause: Secondary | ICD-10-CM | POA: Diagnosis not present

## 2020-02-12 DIAGNOSIS — Z8249 Family history of ischemic heart disease and other diseases of the circulatory system: Secondary | ICD-10-CM | POA: Diagnosis not present

## 2020-02-12 DIAGNOSIS — M19072 Primary osteoarthritis, left ankle and foot: Secondary | ICD-10-CM | POA: Diagnosis present

## 2020-02-12 DIAGNOSIS — R42 Dizziness and giddiness: Secondary | ICD-10-CM | POA: Diagnosis present

## 2020-02-12 DIAGNOSIS — E785 Hyperlipidemia, unspecified: Secondary | ICD-10-CM | POA: Diagnosis present

## 2020-02-12 DIAGNOSIS — Z20822 Contact with and (suspected) exposure to covid-19: Secondary | ICD-10-CM | POA: Diagnosis present

## 2020-02-12 DIAGNOSIS — Z79899 Other long term (current) drug therapy: Secondary | ICD-10-CM | POA: Diagnosis not present

## 2020-02-12 DIAGNOSIS — I10 Essential (primary) hypertension: Secondary | ICD-10-CM | POA: Diagnosis present

## 2020-02-12 DIAGNOSIS — S12110A Anterior displaced Type II dens fracture, initial encounter for closed fracture: Secondary | ICD-10-CM | POA: Diagnosis present

## 2020-02-12 DIAGNOSIS — I160 Hypertensive urgency: Secondary | ICD-10-CM | POA: Diagnosis present

## 2020-02-12 DIAGNOSIS — S12000A Unspecified displaced fracture of first cervical vertebra, initial encounter for closed fracture: Secondary | ICD-10-CM | POA: Diagnosis present

## 2020-02-12 DIAGNOSIS — W010XXA Fall on same level from slipping, tripping and stumbling without subsequent striking against object, initial encounter: Secondary | ICD-10-CM | POA: Diagnosis present

## 2020-02-12 LAB — COMPREHENSIVE METABOLIC PANEL
ALT: 16 U/L (ref 0–44)
AST: 20 U/L (ref 15–41)
Albumin: 3.6 g/dL (ref 3.5–5.0)
Alkaline Phosphatase: 54 U/L (ref 38–126)
Anion gap: 10 (ref 5–15)
BUN: 17 mg/dL (ref 8–23)
CO2: 26 mmol/L (ref 22–32)
Calcium: 9 mg/dL (ref 8.9–10.3)
Chloride: 102 mmol/L (ref 98–111)
Creatinine, Ser: 0.51 mg/dL (ref 0.44–1.00)
GFR, Estimated: >60 mL/min (ref >60)
Glucose, Bld: 118 mg/dL — ABNORMAL HIGH (ref 70–99)
Potassium: 3.2 mmol/L — ABNORMAL LOW (ref 3.5–5.1)
Sodium: 138 mmol/L (ref 135–145)
Total Bilirubin: 1 mg/dL (ref 0.3–1.2)
Total Protein: 6.5 g/dL (ref 6.5–8.1)

## 2020-02-12 LAB — CBC
HCT: 37.3 % (ref 36.0–46.0)
Hemoglobin: 12.3 g/dL (ref 12.0–15.0)
MCH: 29.1 pg (ref 26.0–34.0)
MCHC: 33 g/dL (ref 30.0–36.0)
MCV: 88.4 fL (ref 80.0–100.0)
Platelets: 221 10*3/uL (ref 150–400)
RBC: 4.22 MIL/uL (ref 3.87–5.11)
RDW: 13.9 % (ref 11.5–15.5)
WBC: 7.7 10*3/uL (ref 4.0–10.5)
nRBC: 0 % (ref 0.0–0.2)

## 2020-02-12 LAB — MAGNESIUM: Magnesium: 1.7 mg/dL (ref 1.7–2.4)

## 2020-02-12 MED ORDER — PROMETHAZINE HCL 25 MG/ML IJ SOLN
12.5000 mg | Freq: Once | INTRAMUSCULAR | Status: DC
  Filled 2020-02-12 (×3): qty 1

## 2020-02-12 MED ORDER — LOSARTAN POTASSIUM 50 MG PO TABS: 50.0000 mg | ORAL_TABLET | Freq: Every day | ORAL | Status: DC

## 2020-02-12 MED ORDER — POTASSIUM CHLORIDE CRYS ER 20 MEQ PO TBCR
40.0000 meq | EXTENDED_RELEASE_TABLET | Freq: Two times a day (BID) | ORAL | Status: AC
  Administered 2020-02-12 (×2): 40 meq via ORAL
  Filled 2020-02-12 (×2): qty 2

## 2020-02-12 MED ORDER — FENTANYL CITRATE (PF) 100 MCG/2ML IJ SOLN: 50.0000 ug | INTRAMUSCULAR | Status: DC | PRN

## 2020-02-12 MED ORDER — LOSARTAN POTASSIUM 50 MG PO TABS
50.0000 mg | ORAL_TABLET | Freq: Every day | ORAL | Status: DC
  Administered 2020-02-12: 10:00:00 50 mg via ORAL
  Filled 2020-02-12: qty 1

## 2020-02-12 MED ORDER — PROMETHAZINE HCL 25 MG/ML IJ SOLN
12.5000 mg | Freq: Once | INTRAMUSCULAR | Status: AC
  Administered 2020-02-12: 01:00:00 12.5 mg via INTRAVENOUS
  Filled 2020-02-12: qty 1

## 2020-02-12 MED ORDER — TRAMADOL HCL 50 MG PO TABS
50.0000 mg | ORAL_TABLET | Freq: Four times a day (QID) | ORAL | Status: DC | PRN
  Administered 2020-02-16 (×2): 50 mg via ORAL
  Filled 2020-02-12 (×2): qty 1

## 2020-02-12 NOTE — TOC Initial Note (Signed)
Transition of Care [facility]) - Initial/Assessment Note    Patient Details  Name: Sherry [name] MRN: [id number] Date of Birth: December 08, 1930  [facility] CM/SW Contact:    Magnus Ivan, LCSW Phone Number: 02/12/2020, 1:53 PM  Clinical Narrative:            CSW spoke with patient. Patient lives with husband and drives herself to appointments. PCP is Dr. Sabra Heck. [facility]acy [facility]. Patient has a RW. No HH or SNF history. Patient refusing HH recommendation, says she does not feel she needs it at this time. Patient denied any needs at this time.       Expected Discharge Plan: Home/Self Care Barriers to Discharge: Continued Medical Work up   Patient Goals and CMS Choice Patient states their goals for this hospitalization and ongoing recovery are:: home with self care CMS Medicare.gov Compare Post Acute Care list provided to:: Patient Choice offered to / list presented to : Patient  Expected Discharge Plan and Services Expected Discharge Plan: Home/Self Care       Living arrangements for the past 2 months: Single Family Home                                      Prior Living Arrangements/Services Living arrangements for the past 2 months: Single Family Home Lives with:: Spouse Patient language and need for interpreter reviewed:: Yes Do you feel safe going back to the place where you live?: Yes      Need for Family Participation in Patient Care: Yes (Comment) Care giver support system in place?: Yes (comment) Current home services: DME Criminal Activity/Legal Involvement Pertinent to Current Situation/Hospitalization: No - Comment as needed  Activities of Daily Living Home Assistive Devices/Equipment: Gilford Rile (specify type) ADL Screening (condition at time of admission) Patient's cognitive ability adequate to safely complete daily activities?: Yes Is the patient deaf or have difficulty hearing?: No Does the patient have difficulty seeing, even when  wearing glasses/contacts?: No Does the patient have difficulty concentrating, remembering, or making decisions?: No Patient able to express need for assistance with ADLs?: Yes Does the patient have difficulty dressing or bathing?: No Independently performs ADLs?: Yes (appropriate for developmental age) Does the patient have difficulty walking or climbing stairs?: No Weakness of Legs: None Weakness of Arms/Hands: None  Permission Sought/Granted                  Emotional Assessment       Orientation: : Oriented to Self,Oriented to Place,Oriented to  Time,Oriented to Situation Alcohol / Substance Use: Not Applicable Psych Involvement: No (comment)  Admission diagnosis:  Dizziness [R42] Neck fracture ([place]) [S12.9XXA] Inadequate pain control [R52] Fall, initial encounter [W19.XXXA] Dens fracture, closed, initial encounter ([place]) [S12.100A] Right bundle branch block (RBBB) on electrocardiogram (ECG) [I45.10] Closed nondisplaced fracture of first cervical vertebra, unspecified fracture morphology, initial encounter [facility]) [S12.001A] Patient Active Problem List   Diagnosis Date Noted  . Hypertensive urgency 02/12/2020  . Neck fracture ([place]) 02/11/2020  . Coronary artery calcification seen on CAT scan 05/05/2017  . Chest pain with moderate risk for cardiac etiology 05/05/2017  . Shortness of breath 05/05/2017  . Hyperlipidemia 09/25/2016  . Neuropathy 09/25/2016  . Statin intolerance 09/25/2016  . Anxiety 09/25/2016  . Palpitations 09/25/2016   PCP:  Rusty Aus, MD Pharmacy:   [place], [place]  [address] [place] [address] Phone: [phone] Fax: [phone]  [facility] [place] [address] [place] [address] Phone: [phone] Fax: [phone]     Social Determinants of Health (SDOH) Interventions    Readmission Risk Interventions No  flowsheet data found.

## 2020-02-12 NOTE — Progress Notes (Signed)
Pts BP elevated, MD notified. MD restarting BP home meds.

## 2020-02-12 NOTE — Progress Notes (Signed)
Orthopedic Tech Progress Note Patient Details:  Sherry [name] 1930-11-25 [id number] Was called by RN about patient needing a PEDIATRIC SIZE HARD COLLAR. Stating someone dropped off a collar to big. Told RN that according to notes PA C-CUTHRIELL and her RN applied MIAMI J COLLAR in ED. Called to [facility] asking for a PED [place].  Patient ID: Sherry [name], female   DOB: [birth year], 86 y.o.   MRN: [id number]   [name] 02/12/2020, 1:06 PM

## 2020-02-12 NOTE — Consult Note (Signed)
Referring Physician:  No referring provider defined for this encounter.  Primary Physician:  Rusty Aus, MD  Chief Complaint:    History of Present Illness: Sherry [name] is a 86 y.o. female who presents to the [facility] ED after suffering a fall with resultant nondisplaced fracture through the Dens and nondisplaced fractures of the posterior arch of C1 bilaterally also noted. Neurosurgery is consulted regarding these fractures.   She complains of neck pain but no arm pain, weakness, or radicular symptoms.   Sherry [name] has no symptoms of cervical myelopathy.   Review of Systems:  A 10 point review of systems is negative, except for the pertinent positives and negatives detailed in the HPI.  Past Medical History: Past Medical History:  Diagnosis Date  . Actinic keratosis   . Hypertension     Past Surgical History: Past Surgical History:  Procedure Laterality Date  . BACK SURGERY    . MOHS SURGERY      Allergies: Allergies as of 02/11/2020 - Review Complete 02/11/2020  Allergen Reaction Noted  . Codeine  05/11/2019  . Morphine and related Other (See Comments) 12/15/2014  . Percocet [oxycodone-acetaminophen]  05/11/2019    Medications:  Current Facility-Administered Medications:  .  acetaminophen (TYLENOL) tablet 650 mg, 650 mg, Oral, Q6H PRN, 650 mg at 02/12/20 0254 **OR** acetaminophen (TYLENOL) suppository 650 mg, 650 mg, Rectal, Q6H PRN, Shawna Clamp, MD .  enoxaparin (LOVENOX) injection 40 mg, 40 mg, Subcutaneous, Q24H, Shawna Clamp, MD, 40 mg at 02/11/20 2107 .  fentaNYL (SUBLIMAZE) injection 50 mcg, 50 mcg, Intravenous, Q2H PRN, Jennye Boroughs, MD .  hydrALAZINE (APRESOLINE) injection 10 mg, 10 mg, Intravenous, Q6H PRN, Shawna Clamp, MD, 10 mg at 02/11/20 2325 .  hydrochlorothiazide (HYDRODIURIL) tablet 25 mg, 25 mg, Oral, Daily, Shawna Clamp, MD .  HYDROcodone-acetaminophen (NORCO/VICODIN) 5-325 MG per tablet 1 tablet, 1 tablet, Oral, Q6H PRN,  Shawna Clamp, MD, 1 tablet at 02/11/20 2334 .  ondansetron (ZOFRAN) tablet 4 mg, 4 mg, Oral, Q6H PRN **OR** ondansetron (ZOFRAN) injection 4 mg, 4 mg, Intravenous, Q6H PRN, Shawna Clamp, MD, 4 mg at 02/11/20 1932 .  potassium chloride SA (KLOR-CON) CR tablet 40 mEq, 40 mEq, Oral, BID, Jennye Boroughs, MD .  promethazine (PHENERGAN) injection 12.5 mg, 12.5 mg, Intravenous, Once, Jennye Boroughs, MD .  senna-docusate (Senokot-S) tablet 1 tablet, 1 tablet, Oral, QHS PRN, Shawna Clamp, MD   Social History: Social History   Tobacco Use  . Smoking status: Never Smoker  . Smokeless tobacco: Never Used  Substance Use Topics  . Alcohol use: Yes    Comment: Occassionally  . Drug use: No    Family Medical History: Family History  Problem Relation Age of Onset  . Hypertension Father     Physical Examination: Vitals:   02/12/20 0318 02/12/20 0852  BP: (!) 174/79 (!) 199/73  Pulse: 100 83  Resp: 17 16  Temp: 98.7 F (37.1 C) 98.5 F (36.9 C)  SpO2: 96% 98%     General: Patient is well developed, well nourished, calm, collected, and in no apparent distress.  Psychiatric: Patient is non-anxious.  Head:  Pupils equal, round, and reactive to light.  ENT:  Oral mucosa appears well hydrated.  Neck:   In neck brace  Respiratory: Patient is breathing without any difficulty.    NEUROLOGICAL:  General: In no acute distress.   Awake, alert, oriented to person, place, and time.  Pupils equal round and reactive to light.  Face is symmetric.  ROM of spine: deferred due to fracture    Strength: Side Biceps Triceps Deltoid Interossei Grip  R 5 5 5 5 5   L 5 5 5 5 5    Side Iliopsoas Quads Hamstring PF DF EHL  R 5 5 5 5 5 5   L 5 5 5 5 5 5    Gait deferred as pt in bed.   Imaging: CT Cervical spine 02/11/2020: ADDENDUM REPORT: 02/11/2020 16:02  ADDENDUM: After further review, there is a nondisplaced fracture through the dens. Nondisplaced fractures of the posterior arch  of C1 bilaterally also noted.  These results were called by telephone at the time of interpretation on 02/11/2020 at 4:01 pm to provider Quentin Cornwall, who verbally acknowledged these results.   Electronically Signed   By: Franchot Gallo M.D.   On: 02/11/2020 16:02   Addended by Barnet Glasgow, MD on 02/11/2020 4:04 PM    Study Result  Narrative & Impression  CLINICAL DATA:  Fall at home.  Head injury.  EXAM: CT HEAD WITHOUT CONTRAST  CT CERVICAL SPINE WITHOUT CONTRAST  TECHNIQUE: Multidetector CT imaging of the head and cervical spine was performed following the standard protocol without intravenous contrast. Multiplanar CT image reconstructions of the cervical spine were also generated.  COMPARISON:  CT head 07/06/2019  FINDINGS: CT HEAD FINDINGS  Brain: Generalized atrophy unchanged. Negative for hydrocephalus. Chronic microvascular ischemic changes in the white matter unchanged. Chronic lacunar infarction head of caudate on the left unchanged.  Negative for acute infarct, hemorrhage, mass.  Vascular: Negative for hyperdense vessel  Skull: Nondisplaced left occipital bone fracture unchanged from the prior CT. No acute fracture.  Sinuses/Orbits: Paranasal sinuses clear. Bilateral cataract extraction.  Other: None  CT CERVICAL SPINE FINDINGS  Alignment: Mild anterolisthesis C2-3 and C3-4. 3 mm anterolisthesis C4-5. Mild anterolisthesis C7-T1 and T1-2.  Skull base and vertebrae: Negative for fracture  Soft tissues and spinal canal: Negative  Disc levels: Multilevel disc and facet degeneration. Prominent degenerative change and spurring throughout the cervical spine. Multilevel foraminal stenosis due to spurring.  Upper chest: Lung apices clear bilaterally.  Other: None  IMPRESSION: 1. No acute intracranial abnormality. Atrophy and chronic microvascular ischemic change stable 2. Negative for cervical spine fracture. Prominent  cervical spondylosis 3. Chronic fracture left occipital bone unchanged.  Electronically Signed: By: Franchot Gallo M.D. On: 02/11/2020 10:42      Assessment and Plan: Sherry [name] is a pleasant 86 y.o. female with a dens fracture as well as nondisplaced fractures of the posterior arch of C1 bilaterally. Reassuringly, she is neurologically intact. She was seen this morning with Dr Lacinda Axon. There is currently no surgical intervention indicated at this time and she is stable from a neurosurgical standpoint for discharge.  We recommend that she continues to wear her cervical collar at all times for stabilization of the fracture.   We will see her back in clinic in 3 weeks for continued surveillance of these fractures. She would like to continue with PT and this is acceptable as long as she maintains her cervical collar and immobilization of her neck.    Lonell Face, NP Dept. of Neurosurgery

## 2020-02-12 NOTE — Evaluation (Signed)
Physical Therapy Evaluation Patient Details Name: Sherry [name] MRN: [id number] DOB: [birth year] Today's Date: 02/12/2020   History of Present Illness  Pt is a 86 y.o. female who [facility] to the [facility] ED after suffering a fall with resultant nondisplaced fracture through the Dens and nondisplaced fractures of the posterior arch of C1 bilaterally also noted, hard C collar placed and to be worn at all times per neurosurgeon. PMH of HTN.    Clinical Impression  Patient alert, agreeable to PT evaluation with encouragement due to elevated pain levels, 8/10. The patient reported that she used a walker at home for ambulation at night, but is predominantly independent at home, no other falls.   Cervical collar noted to be too large for pt, RN notified who messaged MD. Would benefit from pediatric sized collar. Overall the patient demonstrated mobility with CGA-supervision and use of RW. Improved safety with transfers and ambulation with walker, though pt able to perform without device. Pt also assisted to brush teeth in standing with supervision. The patient would benefit from further skilled PT intervention to continue to return to PLOF. Recommendation is HHPT with supervision for intermittent supervision and mobility/OOB.      Follow Up Recommendations Home health PT;Supervision - Intermittent;Supervision for mobility/OOB    Equipment Recommendations  None recommended by PT    Recommendations for Other Services       Precautions / Restrictions Precautions Precautions: Fall Required Braces or Orthoses: Cervical Brace Cervical Brace: Hard collar;At all times Restrictions Weight Bearing Restrictions: No      Mobility  Bed Mobility Overal bed mobility: Needs Assistance Bed Mobility: Supine to Sit;Sit to Supine     Supine to sit: HOB elevated;Min guard Sit to supine: HOB elevated;Min guard   General bed mobility comments: physical assist provided as needed for pain management but  able to complete without assistance    Transfers Overall transfer level: Needs assistance Equipment used: Rolling walker (2 wheeled);None Transfers: Sit to/from Stand Sit to Stand: Supervision            Ambulation/Gait Ambulation/Gait assistance: Min guard Gait Distance (Feet):  (several bouts of ambulation, ~20-74ft ea time) Assistive device: Rolling walker (2 wheeled);None       General Gait Details: improved safety noted with RW  Stairs            Wheelchair Mobility    Modified Rankin (Stroke Patients Only)       Balance Overall balance assessment: Needs assistance Sitting-balance support: Feet supported Sitting balance-Leahy Scale: Good       Standing balance-Leahy Scale: Good Standing balance comment: improved safety noted with UE support                             Pertinent Vitals/Pain Pain Assessment: 0-10 Pain Score: 8  Pain Location: neck Pain Descriptors / Indicators: Guarding;Aching;Grimacing;Headache Pain Intervention(s): Limited activity within patient's tolerance    Home Living Family/patient expects to be discharged to:: Private residence Living Arrangements: Spouse/significant other Available Help at Discharge: Family;Available 24 hours/day Type of Home: House Home Access: Level entry     Home Layout: One level Home Equipment: Walker - 2 wheels      Prior Function Level of Independence: Independent with assistive device(s)         Comments: independent majority of time, would use RW to ambulate at night     Hand Dominance   Dominant Hand: Right    Extremity/Trunk Assessment  Upper Extremity Assessment Upper Extremity Assessment: Overall WFL for tasks assessed    Lower Extremity Assessment Lower Extremity Assessment: Overall WFL for tasks assessed;Generalized weakness    Cervical / Trunk Assessment Cervical / Trunk Assessment: Other exceptions Cervical / Trunk Exceptions: cervical collar   Communication   Communication: No difficulties  Cognition Arousal/Alertness: Awake/alert Behavior During Therapy: WFL for tasks assessed/performed Overall Cognitive Status: Within Functional Limits for tasks assessed                                        General Comments      Exercises Other Exercises Other Exercises: Pt able to brush her teeth with set up and supervision, cued to maintain precautions. Other Exercises: supervision for toileting during session as well   Assessment/Plan    PT Assessment Patient needs continued PT services  PT Problem List Decreased strength;Decreased mobility;Decreased range of motion;Decreased activity tolerance;Pain;Decreased knowledge of use of DME;Decreased knowledge of precautions       PT Treatment Interventions DME instruction;Therapeutic exercise;Gait training;Balance training;Stair training;Neuromuscular re-education;Functional mobility training;Therapeutic activities;Patient/family education    PT Goals (Current goals can be found in the Care Plan section)  Acute Rehab PT Goals Patient Stated Goal: patient wants to decrease pain PT Goal Formulation: With patient Time For Goal Achievement: 02/26/20 Potential to Achieve Goals: Good    Frequency 7X/week   Barriers to discharge        Co-evaluation               AM-PAC PT "6 Clicks" Mobility  Outcome Measure Help needed turning from your back to your side while in a flat bed without using bedrails?: A Little Help needed moving from lying on your back to sitting on the side of a flat bed without using bedrails?: A Little Help needed moving to and from a bed to a chair (including a wheelchair)?: A Little Help needed standing up from a chair using your arms (e.g., wheelchair or bedside chair)?: A Little Help needed to walk in hospital room?: A Little Help needed climbing 3-5 steps with a railing? : A Little 6 Click Score: 18    End of Session Equipment  Utilized During Treatment: Gait belt Activity Tolerance: Patient limited by pain Patient left: in bed;with call bell/phone within reach;with bed alarm set Nurse Communication: Mobility status PT Visit Diagnosis: Other abnormalities of gait and mobility (R26.89);Muscle weakness (generalized) (M62.81);Difficulty in walking, not elsewhere classified (R26.2);Pain Pain - Right/Left:  (midline) Pain - part of body:  (cervical)    Time: 5053-9767 PT Time Calculation (min) (ACUTE ONLY): 33 min   Charges:   PT Evaluation $PT Eval Low Complexity: 1 Low PT Treatments $Therapeutic Exercise: 23-37 mins       Lieutenant Diego PT, DPT 1:24 PM,02/12/20

## 2020-02-12 NOTE — Progress Notes (Addendum)
Progress Note    Sherry [name]  LPF:[id number] DOB: 01-Sep-1930  DOA: 02/11/2020 PCP: Rusty Aus, MD      Brief Narrative:    Medical records reviewed and are as summarized below:  Sherry [name] is a 86 y.o. female with medical history significant for hyperlipidemia, statin intolerance, neuropathy, anxiety, hypertension presented to the hospital after a mechanical fall at home.  She complained of severe neck pain after the fall.  She was found to have C1 cervical spine fracture.  She was treated with analgesics.  She was evaluated by the neurosurgeon, Dr. Lacinda Axon, who said the patient is not a good surgical candidate because of advanced age and therefore recommended conservative management.  A neck brace was recommended and this should be worn at all times per neurosurgeon.    Assessment/Plan:   Principal Problem:   Neck fracture (HCC) Active Problems:   Hypertensive urgency   Hyperlipidemia   Neuropathy   Statin intolerance   Anxiety   Body mass index is 24.03 kg/m.   C1 cervical spine fracture: She has been evaluated by Dr. Lacinda Axon, neurosurgeon.  Conservative management is recommended because patient is not a good surgical candidate.  She is requiring IV opioids for pain control.  PT and OT evaluation.,,  RN, has been notified that patient can have IV fentanyl on the MedSurg unit to relieve her pain.  Hypertensive urgency: Severe pain may be contributing to elevated blood pressure.  She said she takes losartan 50 mg daily at home even though this was not listed on her admission med rec.  Continue losartan and HCTZ.  Hypokalemia: Replace potassium and monitor levels.  Diet Order            DIET SOFT Room service appropriate? Yes; Fluid consistency: Thin  Diet effective now                    Consultants:  Neurosurgeon, Dr. Lacinda Axon  Procedures:  None    Medications:   . enoxaparin (LOVENOX) injection  40 mg Subcutaneous Q24H  .  hydrochlorothiazide  25 mg Oral Daily  . losartan  50 mg Oral Daily  . potassium chloride  40 mEq Oral BID  . promethazine  12.5 mg Intravenous Once   Continuous Infusions:   Anti-infectives (From admission, onward)   None             Family Communication/Anticipated D/C date and plan/Code Status   DVT prophylaxis: enoxaparin (LOVENOX) injection 40 mg Start: 02/11/20 2200     Code Status: Full Code  Family Communication: None Disposition Plan:    Status is: Observation  The patient will require care spanning > 2 midnights and should be moved to inpatient because: Ongoing active pain requiring inpatient pain management and IV treatments appropriate due to intensity of illness or inability to take PO  Dispo: The patient is from: Home              Anticipated d/c is to: Home              Anticipated d/c date is: 1 day              Patient currently is not medically stable to d/c.           Subjective:   C/o severe neck pain and nausea with pain medicines  Objective:    Vitals:   02/12/20 0135 02/12/20 0318 02/12/20 0852 02/12/20 1122  BP: (!) 179/71 Marland Kitchen)  174/79 (!) 199/73 (!) 169/72  Pulse: 100 100 83 82  Resp: 20 17 16 18   Temp: 98 F (36.7 C) 98.7 F (37.1 C) 98.5 F (36.9 C) 98.4 F (36.9 C)  TempSrc:  Oral Oral   SpO2:  96% 98% 95%  Weight:      Height:       No data found.   Intake/Output Summary (Last 24 hours) at 02/12/2020 1125 Last data filed at 02/12/2020 1046 Gross per 24 hour  Intake 676.53 ml  Output 300 ml  Net 376.53 ml   Filed Weights   02/11/20 1002  Weight: 63.5 kg    Exam:  GEN: NAD SKIN: No rash EYES: EOMI ENT, Neck: MMM, neck brace in place CV: RRR PULM: CTA B ABD: soft, ND, NT, +BS CNS: AAO x 3, non focal EXT: No edema or tenderness   Data Reviewed:   I have personally reviewed following labs and imaging studies:  Labs: Labs show the following:   Basic Metabolic Panel: Recent Labs  Lab  02/11/20 1242 02/12/20 0343  NA 142 138  K 3.7 3.2*  CL 104 102  CO2 27 26  GLUCOSE 103* 118*  BUN 24* 17  CREATININE 0.51 0.51  CALCIUM 9.7 9.0  MG  --  1.7   GFR Estimated Creatinine Clearance: 42.8 mL/min (by C-G formula based on SCr of 0.51 mg/dL). Liver Function Tests: Recent Labs  Lab 02/11/20 1242 02/12/20 0343  AST 22 20  ALT 17 16  ALKPHOS 55 54  BILITOT 0.7 1.0  PROT 7.0 6.5  ALBUMIN 3.9 3.6   No results for input(s): LIPASE, AMYLASE in the last 168 hours. No results for input(s): AMMONIA in the last 168 hours. Coagulation profile No results for input(s): INR, PROTIME in the last 168 hours.  CBC: Recent Labs  Lab 02/11/20 1242 02/12/20 0343  WBC 7.7 7.7  NEUTROABS 6.0  --   HGB 13.4 12.3  HCT 39.8 37.3  MCV 87.9 88.4  PLT 217 221   Cardiac Enzymes: No results for input(s): CKTOTAL, CKMB, CKMBINDEX, TROPONINI in the last 168 hours. BNP (last 3 results) No results for input(s): PROBNP in the last 8760 hours. CBG: No results for input(s): GLUCAP in the last 168 hours. D-Dimer: No results for input(s): DDIMER in the last 72 hours. Hgb A1c: No results for input(s): HGBA1C in the last 72 hours. Lipid Profile: No results for input(s): CHOL, HDL, LDLCALC, TRIG, CHOLHDL, LDLDIRECT in the last 72 hours. Thyroid function studies: No results for input(s): TSH, T4TOTAL, T3FREE, THYROIDAB in the last 72 hours.  Invalid input(s): FREET3 Anemia work up: No results for input(s): VITAMINB12, FOLATE, FERRITIN, TIBC, IRON, RETICCTPCT in the last 72 hours. Sepsis Labs: Recent Labs  Lab 02/11/20 1242 02/12/20 0343  WBC 7.7 7.7    Microbiology Recent Results (from the past 240 hour(s))  Resp Panel by RT-PCR (Flu A&B, Covid) Nasopharyngeal Swab     Status: None   Collection Time: 02/11/20  4:11 PM   Specimen: Nasopharyngeal Swab; Nasopharyngeal(NP) swabs in vial transport medium  Result Value Ref Range Status   SARS Coronavirus 2 by RT PCR NEGATIVE  NEGATIVE Final    Comment: (NOTE) SARS-CoV-2 target nucleic acids are NOT DETECTED.  The SARS-CoV-2 RNA is generally detectable in upper respiratory specimens during the acute phase of infection. The lowest concentration of SARS-CoV-2 viral copies this assay can detect is 138 copies/mL. A negative result does not preclude SARS-Cov-2 infection and should not be used as  the sole basis for treatment or other patient management decisions. A negative result may occur with  improper specimen collection/handling, submission of specimen other than nasopharyngeal swab, presence of viral mutation(s) within the areas targeted by this assay, and inadequate number of viral copies(<138 copies/mL). A negative result must be combined with clinical observations, patient history, and epidemiological information. The expected result is Negative.  Fact Sheet for Patients:  EntrepreneurPulse.com.au  Fact Sheet for Healthcare Providers:  IncredibleEmployment.be  This test is no t yet approved or cleared by the [place] FDA and  has been authorized for detection and/or diagnosis of SARS-CoV-2 by FDA under an Emergency Use Authorization (EUA). This EUA will remain  in effect (meaning this test can be used) for the duration of the COVID-19 declaration under Section 564(b)(1) of the Act, 21 U.S.C.section 360bbb-3(b)(1), unless the authorization is terminated  or revoked sooner.       Influenza A by PCR NEGATIVE NEGATIVE Final   Influenza B by PCR NEGATIVE NEGATIVE Final    Comment: (NOTE) The Xpert Xpress SARS-CoV-2/FLU/RSV plus assay is intended as an aid in the diagnosis of influenza from Nasopharyngeal swab specimens and should not be used as a sole basis for treatment. Nasal washings and aspirates are unacceptable for Xpert Xpress SARS-CoV-2/FLU/RSV testing.  Fact Sheet for Patients: EntrepreneurPulse.com.au  Fact Sheet for Healthcare  Providers: IncredibleEmployment.be  This test is not yet approved or cleared by the [place] FDA and has been authorized for detection and/or diagnosis of SARS-CoV-2 by FDA under an Emergency Use Authorization (EUA). This EUA will remain in effect (meaning this test can be used) for the duration of the COVID-19 declaration under Section 564(b)(1) of the Act, 21 U.S.C. section 360bbb-3(b)(1), unless the authorization is terminated or revoked.  Performed at [facility], [address] [place], [place] [address]     Procedures and diagnostic studies:  CT Head Wo Contrast  Addendum Date: 02/11/2020   ADDENDUM REPORT: 02/11/2020 16:02 ADDENDUM: After further review, there is a nondisplaced fracture through the dens. Nondisplaced fractures of the posterior arch of C1 bilaterally also noted. These results were called by telephone at the time of interpretation on 02/11/2020 at 4:01 pm to provider Quentin Cornwall, who verbally acknowledged these results. Electronically Signed   By: Franchot Gallo M.D.   On: 02/11/2020 16:02   Result Date: 02/11/2020 CLINICAL DATA:  Fall at home.  Head injury. EXAM: CT HEAD WITHOUT CONTRAST CT CERVICAL SPINE WITHOUT CONTRAST TECHNIQUE: Multidetector CT imaging of the head and cervical spine was performed following the standard protocol without intravenous contrast. Multiplanar CT image reconstructions of the cervical spine were also generated. COMPARISON:  CT head 07/06/2019 FINDINGS: CT HEAD FINDINGS Brain: Generalized atrophy unchanged. Negative for hydrocephalus. Chronic microvascular ischemic changes in the white matter unchanged. Chronic lacunar infarction head of caudate on the left unchanged. Negative for acute infarct, hemorrhage, mass. Vascular: Negative for hyperdense vessel Skull: Nondisplaced left occipital bone fracture unchanged from the prior CT. No acute fracture. Sinuses/Orbits: Paranasal sinuses clear. Bilateral cataract  extraction. Other: None CT CERVICAL SPINE FINDINGS Alignment: Mild anterolisthesis C2-3 and C3-4. 3 mm anterolisthesis C4-5. Mild anterolisthesis C7-T1 and T1-2. Skull base and vertebrae: Negative for fracture Soft tissues and spinal canal: Negative Disc levels: Multilevel disc and facet degeneration. Prominent degenerative change and spurring throughout the cervical spine. Multilevel foraminal stenosis due to spurring. Upper chest: Lung apices clear bilaterally. Other: None IMPRESSION: 1. No acute intracranial abnormality. Atrophy and chronic microvascular ischemic change stable 2. Negative  for cervical spine fracture. Prominent cervical spondylosis 3. Chronic fracture left occipital bone unchanged. Electronically Signed: By: Franchot Gallo M.D. On: 02/11/2020 10:42   CT Cervical Spine Wo Contrast  Addendum Date: 02/11/2020   ADDENDUM REPORT: 02/11/2020 16:02 ADDENDUM: After further review, there is a nondisplaced fracture through the dens. Nondisplaced fractures of the posterior arch of C1 bilaterally also noted. These results were called by telephone at the time of interpretation on 02/11/2020 at 4:01 pm to provider Quentin Cornwall, who verbally acknowledged these results. Electronically Signed   By: Franchot Gallo M.D.   On: 02/11/2020 16:02   Result Date: 02/11/2020 CLINICAL DATA:  Fall at home.  Head injury. EXAM: CT HEAD WITHOUT CONTRAST CT CERVICAL SPINE WITHOUT CONTRAST TECHNIQUE: Multidetector CT imaging of the head and cervical spine was performed following the standard protocol without intravenous contrast. Multiplanar CT image reconstructions of the cervical spine were also generated. COMPARISON:  CT head 07/06/2019 FINDINGS: CT HEAD FINDINGS Brain: Generalized atrophy unchanged. Negative for hydrocephalus. Chronic microvascular ischemic changes in the white matter unchanged. Chronic lacunar infarction head of caudate on the left unchanged. Negative for acute infarct, hemorrhage, mass. Vascular:  Negative for hyperdense vessel Skull: Nondisplaced left occipital bone fracture unchanged from the prior CT. No acute fracture. Sinuses/Orbits: Paranasal sinuses clear. Bilateral cataract extraction. Other: None CT CERVICAL SPINE FINDINGS Alignment: Mild anterolisthesis C2-3 and C3-4. 3 mm anterolisthesis C4-5. Mild anterolisthesis C7-T1 and T1-2. Skull base and vertebrae: Negative for fracture Soft tissues and spinal canal: Negative Disc levels: Multilevel disc and facet degeneration. Prominent degenerative change and spurring throughout the cervical spine. Multilevel foraminal stenosis due to spurring. Upper chest: Lung apices clear bilaterally. Other: None IMPRESSION: 1. No acute intracranial abnormality. Atrophy and chronic microvascular ischemic change stable 2. Negative for cervical spine fracture. Prominent cervical spondylosis 3. Chronic fracture left occipital bone unchanged. Electronically Signed: By: Franchot Gallo M.D. On: 02/11/2020 10:42   MR Brain W and Wo Contrast  Result Date: 02/11/2020 CLINICAL DATA:  Fall, dizziness EXAM: MRI HEAD WITHOUT AND WITH CONTRAST TECHNIQUE: Multiplanar, multiecho pulse sequences of the brain and surrounding structures were obtained without and with intravenous contrast. CONTRAST:  27mL GADAVIST GADOBUTROL 1 MMOL/ML IV SOLN COMPARISON:  None. FINDINGS: Brain: There is no acute infarction or intracranial hemorrhage. There is no intracranial mass, mass effect, or edema. There is no hydrocephalus or extra-axial fluid collection. Prominence of the ventricles and sulci reflects mild generalized parenchymal volume loss. There are small chronic infarcts of the basal ganglia bilaterally and left cerebellum. Anterior right frontal encephalomalacia. Additional patchy and confluent areas of T2 hyperintensity in the supratentorial white matter are nonspecific but probably reflect mild to moderate chronic microvascular ischemic changes. No abnormal enhancement. Vascular: Major  vessel flow voids at the skull base are preserved. Skull and upper cervical spine: Normal marrow signal is preserved. Sinuses/Orbits: Minor mucosal thickening. Bilateral lens replacements. Other: Sella is unremarkable.  Mastoid air cells are clear. IMPRESSION: No evidence of acute infarction, hemorrhage, or mass. Chronic infarcts and chronic microvascular ischemic changes. Electronically Signed   By: Macy Mis M.D.   On: 02/11/2020 16:06   MR Cervical Spine Wo Contrast  Result Date: 02/11/2020 CLINICAL DATA:  Fall, negative CT EXAM: MRI CERVICAL SPINE WITHOUT CONTRAST TECHNIQUE: Multiplanar, multisequence MR imaging of the cervical spine was performed. No intravenous contrast was administered. COMPARISON:  None. FINDINGS: Alignment: Mild multilevel degenerative listhesis including anterolisthesis at C2-C3, C3-C4, C6-C7, and C7-T1. Vertebrae: There is a STIR hyperintense fracture cleft through the  dens. Mild posterior angulation of the dens. Fluid signal is present at the atlantodental articulation on the sagittal view. This is not covered on axial imaging. There appears to be STIR hyperintensity along the fracture lines of the C1 posterior posterior arch. Cord: No abnormal signal. Posterior Fossa, vertebral arteries, paraspinal tissues: Mild prevertebral soft tissue swelling at C1 and C2 levels. Disc levels: C2-C3: Minimal disc bulge with small endplate osteophytes. Facet hypertrophy. No canal stenosis. Minor foraminal stenosis. C3-C4: Disc bulge with endplate osteophytes and facet and uncovertebral hypertrophy. No canal stenosis. Moderate to marked foraminal stenosis. C4-C5: Disc bulge with endplate osteophytes and left greater than right uncovertebral and facet hypertrophy. No canal or right foraminal stenosis. Moderate to marked left foraminal stenosis. C5-C6: Disc osteophyte complex with uncovertebral and left greater than right facet hypertrophy. Mild canal stenosis. Moderate to marked right and  marked left foraminal stenosis. C6-C7: Disc osteophyte complex with uncovertebral greater than facet hypertrophy. Mild canal stenosis. Moderate to marked foraminal stenosis. C7-T1:  Facet hypertrophy.  No canal or foraminal stenosis. IMPRESSION: Acute type 2 dens fracture with mild posterior angulation. Fluid is present at the atlantodental articulation, which is only covered on sagittal imaging. Suspected STIR hyperintensity along bilateral nondisplaced C1 posterior arch fracture clefts suggesting acuity. Multilevel degenerative changes. No high-grade canal stenosis. Multilevel foraminal narrowing. These results were called by telephone at the time of interpretation on 02/11/2020 at 3:45 pm to provider Ashok Cordia , who verbally acknowledged these results. Electronically Signed   By: Macy Mis M.D.   On: 02/11/2020 15:57               LOS: 0 days   Sheliah Fiorillo  [facility]   Pager on [email or web address]. If 7PM-7AM, please contact night-coverage at [email or web address]     02/12/2020, 11:25 AM

## 2020-02-12 NOTE — Progress Notes (Signed)
Pts BP elevated, PRN Hydralazine given. Pt called me in the room about an hr after and states she felt the same way after getting it as she did last night. Pt states it made her feel shaky and anxious. She contributes it to the Hydralazine.    MD notified of above. Per MD give PRN Fentanyl. Orders receive to D/C Hydralazine.  Pt notified, pt states she "does not want to take anything right now", she does not want to feel like she did last night after having pain medicine" MD notified.

## 2020-02-12 NOTE — Evaluation (Signed)
Occupational Therapy Evaluation Patient Details Name: Sherry [name] MRN: [id number] DOB: [birth year] Today's Date: 02/12/2020    History of Present Illness Pt is a 86 y.o. female who presents to the [facility] ED after suffering a fall with resultant nondisplaced fracture through the Dens and nondisplaced fractures of the posterior arch of C1 bilaterally also noted, hard C collar placed and to be worn at all times per neurosurgeon. PMH of HTN.   Clinical Impression   Patient presenting with decreased I in self care, balance, functional mobility,transfer, endurance, and safety awareness.  Patient reports living at home with husband at mod I level with use of RW PTA. Pt has no other equipment at home. Her husband takes her to appointments and drives. OT reviewed purpose of wearing cervical brace and discussed cervical precautions with self care tasks. Pt verbalized understanding. Patient currently functioning at supervision - min A for functional mobility and self care tasks with  Min cuing for technique. Pt with elevated pain during session.  Patient will benefit from acute OT to increase overall independence in the areas of ADLs, functional mobility, and safety awareness in order to safely discharge home with husband.     Follow Up Recommendations  Supervision/Assistance - 24 hour    Equipment Recommendations  None recommended by OT       Precautions / Restrictions Precautions Precautions: Fall Required Braces or Orthoses: Cervical Brace Cervical Brace: Hard collar;At all times Restrictions Weight Bearing Restrictions: No      Mobility Bed Mobility Overal bed mobility: Needs Assistance Bed Mobility: Supine to Sit;Sit to Supine     Supine to sit: HOB elevated;Min guard Sit to supine: HOB elevated;Min guard   General bed mobility comments: physical assist provided as needed for pain management but able to complete without assistance    Transfers Overall transfer level: Needs  assistance Equipment used: Rolling walker (2 wheeled);None Transfers: Sit to/from Stand Sit to Stand: Min guard              Balance Overall balance assessment: Needs assistance Sitting-balance support: Feet supported Sitting balance-Leahy Scale: Good       Standing balance-Leahy Scale: Good Standing balance comment: improved safety noted with UE support                           ADL either performed or assessed with clinical judgement   ADL Overall ADL's : Needs assistance/impaired Eating/Feeding: Modified independent   Grooming: Wash/dry hands;Wash/dry face;Oral care;Sitting;Supervision/safety                   Toilet Transfer: Economist and Hygiene: Min guard;Sit to/from stand               Vision Patient Visual Report: No change from baseline              Pertinent Vitals/Pain Pain Assessment: 0-10 Pain Score: 6  Pain Location: neck Pain Descriptors / Indicators: Guarding;Aching;Grimacing;Headache Pain Intervention(s): Limited activity within patient's tolerance;Premedicated before session;Monitored during session     Hand Dominance Right   Extremity/Trunk Assessment Upper Extremity Assessment Upper Extremity Assessment: Overall WFL for tasks assessed   Lower Extremity Assessment Lower Extremity Assessment: Generalized weakness   Cervical / Trunk Assessment Cervical / Trunk Assessment: Other exceptions Cervical / Trunk Exceptions: cervical collar   Communication Communication Communication: No difficulties   Cognition Arousal/Alertness: Awake/alert Behavior During Therapy: WFL for tasks assessed/performed Overall Cognitive Status:  Within Functional Limits for tasks assessed                                        Exercises Other Exercises Other Exercises: Pt able to brush her teeth with set up and supervision, cued to maintain precautions. Other  Exercises: supervision for toileting during session as well        Home Living Family/patient expects to be discharged to:: Private residence Living Arrangements: Spouse/significant other Available Help at Discharge: Family;Available 24 hours/day Type of Home: House Home Access: Level entry     Home Layout: One level               Home Equipment: Walker - 2 wheels          Prior Functioning/Environment Level of Independence: Independent with assistive device(s)        Comments: independent majority of time, would use RW to ambulate at night        OT Problem List: Decreased strength;Decreased coordination;Pain;Decreased activity tolerance;Decreased safety awareness;Impaired balance (sitting and/or standing)      OT Treatment/Interventions: Self-care/ADL training;Therapeutic exercise;Therapeutic activities;Energy conservation;DME and/or AE instruction;Patient/family education;Balance training;Neuromuscular education    OT Goals(Current goals can be found in the care plan section) Acute Rehab OT Goals Patient Stated Goal: patient wants to decrease pain OT Goal Formulation: With patient Time For Goal Achievement: 02/26/20 Potential to Achieve Goals: Good ADL Goals Pt Will Perform Grooming: with modified independence;standing Pt Will Perform Lower Body Dressing: with modified independence;sit to/from stand Pt Will Transfer to Toilet: with modified independence;ambulating Pt Will Perform Toileting - Clothing Manipulation and hygiene: with modified independence;sit to/from stand  OT Frequency: Min 1X/week           AM-PAC OT "6 Clicks" Daily Activity     Outcome Measure Help from another person eating meals?: None Help from another person taking care of personal grooming?: A Little Help from another person toileting, which includes using toliet, bedpan, or urinal?: A Little Help from another person bathing (including washing, rinsing, drying)?: A Little Help  from another person to put on and taking off regular upper body clothing?: None Help from another person to put on and taking off regular lower body clothing?: A Little 6 Click Score: 20   End of Session Nurse Communication: Mobility status  Activity Tolerance: Patient tolerated treatment well Patient left: in bed;with bed alarm set  OT Visit Diagnosis: Unsteadiness on feet (R26.81);Muscle weakness (generalized) (M62.81)                Time: 1694-5038 OT Time Calculation (min): 24 min Charges:  OT General Charges $OT Visit: 1 Visit OT Evaluation $OT Eval Low Complexity: 1 Low OT Treatments $Self Care/Home Management : 8-22 mins  Darleen Crocker, MS, OTR/L , CBIS ascom [phone]  02/12/20, 4:29 PM

## 2020-02-13 LAB — POTASSIUM: Potassium: 4.3 mmol/L (ref 3.5–5.1)

## 2020-02-13 MED ORDER — LABETALOL HCL 5 MG/ML IV SOLN: 10.0000 mg | INTRAVENOUS | Status: DC | PRN

## 2020-02-13 MED ORDER — LOSARTAN POTASSIUM 50 MG PO TABS
100.0000 mg | ORAL_TABLET | Freq: Every day | ORAL | Status: DC
  Administered 2020-02-13: 08:00:00 100 mg via ORAL
  Filled 2020-02-13: qty 2

## 2020-02-13 MED ORDER — AMLODIPINE BESYLATE 10 MG PO TABS
10.0000 mg | ORAL_TABLET | Freq: Every day | ORAL | Status: DC
  Administered 2020-02-13: 10:00:00 10 mg via ORAL
  Filled 2020-02-13 (×2): qty 1

## 2020-02-13 MED ORDER — LABETALOL HCL 5 MG/ML IV SOLN
10.0000 mg | INTRAVENOUS | Status: DC | PRN
  Administered 2020-02-13: 06:00:00 10 mg via INTRAVENOUS
  Filled 2020-02-13: qty 4

## 2020-02-13 NOTE — Progress Notes (Signed)
PT Cancellation Note  Patient Details Name: Sherry [name] MRN: [id number] DOB: 1930-10-20   Cancelled Treatment:    Reason Eval/Treat Not Completed: Other (comment). Pt with headache upon PT entrance, also with elevated BP this AM, PT to re-attempt as able, RN aware of elevated BP. Brace adjusted per pt request.  Lieutenant Diego PT, DPT 9:09 AM,02/13/20

## 2020-02-13 NOTE — Progress Notes (Signed)
PT Cancellation Note  Patient Details Name: Sherry [name] MRN: [id number] DOB: 1930/03/27   Cancelled Treatment:    Reason Eval/Treat Not Completed: Other (comment). Pt refused PT this PM due to wanting to rest since her headache had improved and her neck was not hurting as much and wanting to see if she would be able to take a nap. PT to re-attempt as able.   Lieutenant Diego PT, DPT 2:57 PM,02/13/20

## 2020-02-13 NOTE — Progress Notes (Signed)
PROGRESS NOTE    Sherry [name]  MWU:[id number] DOB: February 24, 1931 DOA: 02/11/2020 PCP: Rusty Aus, MD   Chief Complain:Fall  Brief Narrative:  Sherry [name] is a 86 y.o. female with medical history significant for hyperlipidemia, statin intolerance, neuropathy, anxiety, hypertension presented to the hospital after a mechanical fall at home.  She complained of severe neck pain after the fall. She was found to have C1 cervical spine fracture.  She was treated with analgesics.  She was evaluated by the neurosurgeon, Dr. Lacinda Axon, who said the patient is not a good surgical candidate because of advanced age and therefore recommended conservative management.  A neck brace was recommended and this should be worn at all times per neurosurgeon. Hospital course remarkable for severe hypertension.  Assessment & Plan:   C1 cervical spine fracture: She has been evaluated by Dr. Lacinda Axon, neurosurgeon.  Conservative management is recommended because patient is not a good surgical candidate.  She is requiring IV opioids for pain control.  PT and OT evaluation done. Most likely she needs home health. See needs to follow-up with neurosurgery as an outpatient in 3 weeks  Hypertensive urgency: Severe pain may be contributing to elevated blood pressure.  She said she takes losartan 50 mg daily at home even though this was not listed on her admission med rec.  blood pressure remains very high today. Increased the dose of losartan, added hydrochlorothiazide and amlodipine.  Hypokalemia: Supplemented and corrected   Principal Problem:   Neck fracture ([place]) Active Problems:   Hyperlipidemia   Neuropathy   Statin intolerance   Anxiety   Hypertensive urgency   Closed C1 fracture ([place])            DVT prophylaxis:Lovenox Code Status: Full Family Communication: None at the bedside Status is: Inpatient  Remains inpatient appropriate because:Inpatient level of care appropriate due to severity  of illness   Dispo: The patient is from: Home              Anticipated d/c is to: Home              Anticipated d/c date is: 1 day              Patient currently is not medically stable to d/c.    Consultants: Neurosurgery  Procedures:None  Antimicrobials:  Anti-infectives (From admission, onward)   None      Subjective:  Patient seen and examined at the bedside this morning. Very hypertensive this morning. Reluctant to take her blood pressure medications. Complains of some neck pain.  Objective: Vitals:   02/13/20 0436 02/13/20 0741 02/13/20 0747 02/13/20 0926  BP: (!) 221/90 (!) 185/85 (!) 182/82 (!) 210/87  Pulse: 83 67  65  Resp: 16 19    Temp: 98.4 F (36.9 C) 98.9 F (37.2 C)    TempSrc:  Oral    SpO2: 96% 96%  97%  Weight:      Height:        Intake/Output Summary (Last 24 hours) at 02/13/2020 7253 Last data filed at 02/12/2020 2203 Gross per 24 hour  Intake 240 ml  Output 750 ml  Net -510 ml   Filed Weights   02/11/20 1002  Weight: 63.5 kg    Examination:  General exam: Pleasant elderly female HEENT: Cervical collar Respiratory system: Bilateral equal air entry, normal vesicular breath sounds, no wheezes or crackles  Cardiovascular system: S1 & S2 heard, RRR. No JVD, murmurs, rubs, gallops or clicks. No pedal edema.  Gastrointestinal system: Abdomen is nondistended, soft and nontender. No organomegaly or masses felt. Normal bowel sounds heard. Central nervous system: Alert and oriented. No focal neurological deficits. Extremities: No edema, no clubbing ,no cyanosis Skin: No rashes, lesions or ulcers,no icterus ,no pallor   Data Reviewed: I have personally reviewed following labs and imaging studies  CBC: Recent Labs  Lab 02/11/20 1242 02/12/20 0343  WBC 7.7 7.7  NEUTROABS 6.0  --   HGB 13.4 12.3  HCT 39.8 37.3  MCV 87.9 88.4  PLT 217 245   Basic Metabolic Panel: Recent Labs  Lab 02/11/20 1242 02/12/20 0343 02/13/20 0529  NA 142  138  --   K 3.7 3.2* 4.3  CL 104 102  --   CO2 27 26  --   GLUCOSE 103* 118*  --   BUN 24* 17  --   CREATININE 0.51 0.51  --   CALCIUM 9.7 9.0  --   MG  --  1.7  --    GFR: Estimated Creatinine Clearance: 42.8 mL/min (by C-G formula based on SCr of 0.51 mg/dL). Liver Function Tests: Recent Labs  Lab 02/11/20 1242 02/12/20 0343  AST 22 20  ALT 17 16  ALKPHOS 55 54  BILITOT 0.7 1.0  PROT 7.0 6.5  ALBUMIN 3.9 3.6   No results for input(s): LIPASE, AMYLASE in the last 168 hours. No results for input(s): AMMONIA in the last 168 hours. Coagulation Profile: No results for input(s): INR, PROTIME in the last 168 hours. Cardiac Enzymes: No results for input(s): CKTOTAL, CKMB, CKMBINDEX, TROPONINI in the last 168 hours. BNP (last 3 results) No results for input(s): PROBNP in the last 8760 hours. HbA1C: No results for input(s): HGBA1C in the last 72 hours. CBG: No results for input(s): GLUCAP in the last 168 hours. Lipid Profile: No results for input(s): CHOL, HDL, LDLCALC, TRIG, CHOLHDL, LDLDIRECT in the last 72 hours. Thyroid Function Tests: No results for input(s): TSH, T4TOTAL, FREET4, T3FREE, THYROIDAB in the last 72 hours. Anemia Panel: No results for input(s): VITAMINB12, FOLATE, FERRITIN, TIBC, IRON, RETICCTPCT in the last 72 hours. Sepsis Labs: No results for input(s): PROCALCITON, LATICACIDVEN in the last 168 hours.  Recent Results (from the past 240 hour(s))  Resp Panel by RT-PCR (Flu A&B, Covid) Nasopharyngeal Swab     Status: None   Collection Time: 02/11/20  4:11 PM   Specimen: Nasopharyngeal Swab; Nasopharyngeal(NP) swabs in vial transport medium  Result Value Ref Range Status   SARS Coronavirus 2 by RT PCR NEGATIVE NEGATIVE Final    Comment: (NOTE) SARS-CoV-2 target nucleic acids are NOT DETECTED.  The SARS-CoV-2 RNA is generally detectable in upper respiratory specimens during the acute phase of infection. The lowest concentration of SARS-CoV-2 viral  copies this assay can detect is 138 copies/mL. A negative result does not preclude SARS-Cov-2 infection and should not be used as the sole basis for treatment or other patient management decisions. A negative result may occur with  improper specimen collection/handling, submission of specimen other than nasopharyngeal swab, presence of viral mutation(s) within the areas targeted by this assay, and inadequate number of viral copies(<138 copies/mL). A negative result must be combined with clinical observations, patient history, and epidemiological information. The expected result is Negative.  Fact Sheet for Patients:  EntrepreneurPulse.com.au  Fact Sheet for Healthcare Providers:  IncredibleEmployment.be  This test is no t yet approved or cleared by the [place] FDA and  has been authorized for detection and/or diagnosis of SARS-CoV-2 by FDA under  an Emergency Use Authorization (EUA). This EUA will remain  in effect (meaning this test can be used) for the duration of the COVID-19 declaration under Section 564(b)(1) of the Act, 21 U.S.C.section 360bbb-3(b)(1), unless the authorization is terminated  or revoked sooner.       Influenza A by PCR NEGATIVE NEGATIVE Final   Influenza B by PCR NEGATIVE NEGATIVE Final    Comment: (NOTE) The Xpert Xpress SARS-CoV-2/FLU/RSV plus assay is intended as an aid in the diagnosis of influenza from Nasopharyngeal swab specimens and should not be used as a sole basis for treatment. Nasal washings and aspirates are unacceptable for Xpert Xpress SARS-CoV-2/FLU/RSV testing.  Fact Sheet for Patients: EntrepreneurPulse.com.au  Fact Sheet for Healthcare Providers: IncredibleEmployment.be  This test is not yet approved or cleared by the [place] FDA and has been authorized for detection and/or diagnosis of SARS-CoV-2 by FDA under an Emergency Use Authorization (EUA). This  EUA will remain in effect (meaning this test can be used) for the duration of the COVID-19 declaration under Section 564(b)(1) of the Act, 21 U.S.C. section 360bbb-3(b)(1), unless the authorization is terminated or revoked.  Performed at [facility], [address] [place], [place] [address]          Radiology Studies: CT Head Wo Contrast  Addendum Date: 02/11/2020   ADDENDUM REPORT: 02/11/2020 16:02 ADDENDUM: After further review, there is a nondisplaced fracture through the dens. Nondisplaced fractures of the posterior arch of C1 bilaterally also noted. These results were called by telephone at the time of interpretation on 02/11/2020 at 4:01 pm to provider Quentin Cornwall, who verbally acknowledged these results. Electronically Signed   By: Franchot Gallo M.D.   On: 02/11/2020 16:02   Result Date: 02/11/2020 CLINICAL DATA:  Fall at home.  Head injury. EXAM: CT HEAD WITHOUT CONTRAST CT CERVICAL SPINE WITHOUT CONTRAST TECHNIQUE: Multidetector CT imaging of the head and cervical spine was performed following the standard protocol without intravenous contrast. Multiplanar CT image reconstructions of the cervical spine were also generated. COMPARISON:  CT head 07/06/2019 FINDINGS: CT HEAD FINDINGS Brain: Generalized atrophy unchanged. Negative for hydrocephalus. Chronic microvascular ischemic changes in the white matter unchanged. Chronic lacunar infarction head of caudate on the left unchanged. Negative for acute infarct, hemorrhage, mass. Vascular: Negative for hyperdense vessel Skull: Nondisplaced left occipital bone fracture unchanged from the prior CT. No acute fracture. Sinuses/Orbits: Paranasal sinuses clear. Bilateral cataract extraction. Other: None CT CERVICAL SPINE FINDINGS Alignment: Mild anterolisthesis C2-3 and C3-4. 3 mm anterolisthesis C4-5. Mild anterolisthesis C7-T1 and T1-2. Skull base and vertebrae: Negative for fracture Soft tissues and spinal canal: Negative Disc  levels: Multilevel disc and facet degeneration. Prominent degenerative change and spurring throughout the cervical spine. Multilevel foraminal stenosis due to spurring. Upper chest: Lung apices clear bilaterally. Other: None IMPRESSION: 1. No acute intracranial abnormality. Atrophy and chronic microvascular ischemic change stable 2. Negative for cervical spine fracture. Prominent cervical spondylosis 3. Chronic fracture left occipital bone unchanged. Electronically Signed: By: Franchot Gallo M.D. On: 02/11/2020 10:42   CT Cervical Spine Wo Contrast  Addendum Date: 02/11/2020   ADDENDUM REPORT: 02/11/2020 16:02 ADDENDUM: After further review, there is a nondisplaced fracture through the dens. Nondisplaced fractures of the posterior arch of C1 bilaterally also noted. These results were called by telephone at the time of interpretation on 02/11/2020 at 4:01 pm to provider Quentin Cornwall, who verbally acknowledged these results. Electronically Signed   By: Franchot Gallo M.D.   On: 02/11/2020 16:02   Result Date:  02/11/2020 CLINICAL DATA:  Fall at home.  Head injury. EXAM: CT HEAD WITHOUT CONTRAST CT CERVICAL SPINE WITHOUT CONTRAST TECHNIQUE: Multidetector CT imaging of the head and cervical spine was performed following the standard protocol without intravenous contrast. Multiplanar CT image reconstructions of the cervical spine were also generated. COMPARISON:  CT head 07/06/2019 FINDINGS: CT HEAD FINDINGS Brain: Generalized atrophy unchanged. Negative for hydrocephalus. Chronic microvascular ischemic changes in the white matter unchanged. Chronic lacunar infarction head of caudate on the left unchanged. Negative for acute infarct, hemorrhage, mass. Vascular: Negative for hyperdense vessel Skull: Nondisplaced left occipital bone fracture unchanged from the prior CT. No acute fracture. Sinuses/Orbits: Paranasal sinuses clear. Bilateral cataract extraction. Other: None CT CERVICAL SPINE FINDINGS Alignment: Mild  anterolisthesis C2-3 and C3-4. 3 mm anterolisthesis C4-5. Mild anterolisthesis C7-T1 and T1-2. Skull base and vertebrae: Negative for fracture Soft tissues and spinal canal: Negative Disc levels: Multilevel disc and facet degeneration. Prominent degenerative change and spurring throughout the cervical spine. Multilevel foraminal stenosis due to spurring. Upper chest: Lung apices clear bilaterally. Other: None IMPRESSION: 1. No acute intracranial abnormality. Atrophy and chronic microvascular ischemic change stable 2. Negative for cervical spine fracture. Prominent cervical spondylosis 3. Chronic fracture left occipital bone unchanged. Electronically Signed: By: Franchot Gallo M.D. On: 02/11/2020 10:42   MR Brain W and Wo Contrast  Result Date: 02/11/2020 CLINICAL DATA:  Fall, dizziness EXAM: MRI HEAD WITHOUT AND WITH CONTRAST TECHNIQUE: Multiplanar, multiecho pulse sequences of the brain and surrounding structures were obtained without and with intravenous contrast. CONTRAST:  70mL GADAVIST GADOBUTROL 1 MMOL/ML IV SOLN COMPARISON:  None. FINDINGS: Brain: There is no acute infarction or intracranial hemorrhage. There is no intracranial mass, mass effect, or edema. There is no hydrocephalus or extra-axial fluid collection. Prominence of the ventricles and sulci reflects mild generalized parenchymal volume loss. There are small chronic infarcts of the basal ganglia bilaterally and left cerebellum. Anterior right frontal encephalomalacia. Additional patchy and confluent areas of T2 hyperintensity in the supratentorial white matter are nonspecific but probably reflect mild to moderate chronic microvascular ischemic changes. No abnormal enhancement. Vascular: Major vessel flow voids at the skull base are preserved. Skull and upper cervical spine: Normal marrow signal is preserved. Sinuses/Orbits: Minor mucosal thickening. Bilateral lens replacements. Other: Sella is unremarkable.  Mastoid air cells are clear.  IMPRESSION: No evidence of acute infarction, hemorrhage, or mass. Chronic infarcts and chronic microvascular ischemic changes. Electronically Signed   By: Macy Mis M.D.   On: 02/11/2020 16:06   MR Cervical Spine Wo Contrast  Result Date: 02/11/2020 CLINICAL DATA:  Fall, negative CT EXAM: MRI CERVICAL SPINE WITHOUT CONTRAST TECHNIQUE: Multiplanar, multisequence MR imaging of the cervical spine was performed. No intravenous contrast was administered. COMPARISON:  None. FINDINGS: Alignment: Mild multilevel degenerative listhesis including anterolisthesis at C2-C3, C3-C4, C6-C7, and C7-T1. Vertebrae: There is a STIR hyperintense fracture cleft through the dens. Mild posterior angulation of the dens. Fluid signal is present at the atlantodental articulation on the sagittal view. This is not covered on axial imaging. There appears to be STIR hyperintensity along the fracture lines of the C1 posterior posterior arch. Cord: No abnormal signal. Posterior Fossa, vertebral arteries, paraspinal tissues: Mild prevertebral soft tissue swelling at C1 and C2 levels. Disc levels: C2-C3: Minimal disc bulge with small endplate osteophytes. Facet hypertrophy. No canal stenosis. Minor foraminal stenosis. C3-C4: Disc bulge with endplate osteophytes and facet and uncovertebral hypertrophy. No canal stenosis. Moderate to marked foraminal stenosis. C4-C5: Disc bulge with endplate osteophytes and  left greater than right uncovertebral and facet hypertrophy. No canal or right foraminal stenosis. Moderate to marked left foraminal stenosis. C5-C6: Disc osteophyte complex with uncovertebral and left greater than right facet hypertrophy. Mild canal stenosis. Moderate to marked right and marked left foraminal stenosis. C6-C7: Disc osteophyte complex with uncovertebral greater than facet hypertrophy. Mild canal stenosis. Moderate to marked foraminal stenosis. C7-T1:  Facet hypertrophy.  No canal or foraminal stenosis. IMPRESSION: Acute  type 2 dens fracture with mild posterior angulation. Fluid is present at the atlantodental articulation, which is only covered on sagittal imaging. Suspected STIR hyperintensity along bilateral nondisplaced C1 posterior arch fracture clefts suggesting acuity. Multilevel degenerative changes. No high-grade canal stenosis. Multilevel foraminal narrowing. These results were called by telephone at the time of interpretation on 02/11/2020 at 3:45 pm to provider Ashok Cordia , who verbally acknowledged these results. Electronically Signed   By: Macy Mis M.D.   On: 02/11/2020 15:57        Scheduled Meds: . amLODipine  10 mg Oral Daily  . enoxaparin (LOVENOX) injection  40 mg Subcutaneous Q24H  . hydrochlorothiazide  25 mg Oral Daily  . losartan  100 mg Oral Daily  . promethazine  12.5 mg Intravenous Once   Continuous Infusions:   LOS: 1 day    Time spent: 25 mins,More than 50% of that time was spent in counseling and/or coordination of care.      Shelly Coss, MD [facility] P12/15/2021, 9:59 AM

## 2020-02-13 NOTE — Progress Notes (Signed)
pts BP elevated, MD notified.

## 2020-02-13 NOTE — Progress Notes (Signed)
PT Cancellation Note  Patient Details Name: Sherry [name] MRN: [id number] DOB: Nov 02, 1930   Cancelled Treatment:    Reason Eval/Treat Not Completed: Other (comment) (Pt with elevated BP still this AM, mobility held, RN aware.)  Lieutenant Diego PT, DPT 10:50 AM,02/13/20

## 2020-02-14 MED ORDER — SODIUM CHLORIDE 0.9 % IV SOLN: INTRAVENOUS | Status: DC

## 2020-02-14 MED ORDER — HYDRALAZINE HCL 20 MG/ML IJ SOLN: 10.0000 mg | Freq: Four times a day (QID) | INTRAMUSCULAR | Status: DC | PRN

## 2020-02-14 MED ORDER — LOSARTAN POTASSIUM 100 MG PO TABS: 100.0000 mg | ORAL_TABLET | Freq: Every day | ORAL | 1 refills | Status: DC

## 2020-02-14 MED ORDER — MECLIZINE HCL 25 MG PO TABS
25.0000 mg | ORAL_TABLET | Freq: Once | ORAL | Status: AC
  Administered 2020-02-14: 18:00:00 25 mg via ORAL
  Filled 2020-02-14: qty 1

## 2020-02-14 MED ORDER — AMLODIPINE BESYLATE 10 MG PO TABS: 10.0000 mg | ORAL_TABLET | Freq: Every day | ORAL | 1 refills | Status: DC

## 2020-02-14 MED ORDER — MECLIZINE HCL 12.5 MG PO TABS
12.5000 mg | ORAL_TABLET | Freq: Four times a day (QID) | ORAL | Status: DC | PRN
  Administered 2020-02-14 – 2020-02-15 (×2): 12.5 mg via ORAL
  Filled 2020-02-14 (×4): qty 1

## 2020-02-14 MED ORDER — HYDROCHLOROTHIAZIDE 25 MG PO TABS: 25.0000 mg | ORAL_TABLET | Freq: Every day | ORAL | 1 refills | Status: DC

## 2020-02-14 MED ORDER — LOSARTAN POTASSIUM 50 MG PO TABS
100.0000 mg | ORAL_TABLET | Freq: Every day | ORAL | Status: DC
  Administered 2020-02-14: 06:00:00 100 mg via ORAL
  Filled 2020-02-14 (×2): qty 2

## 2020-02-14 NOTE — Progress Notes (Signed)
BP rechecked after giving losartan medication. Currently 154/67.

## 2020-02-14 NOTE — Progress Notes (Signed)
Patient BP rechecked. 0529 L arm 177/69 and R arm 170/63. NP Randol Kern notified. She ordered PRN hydralazine. Patient refused. Stated she only wanted to take her Losartan. Hassan Rowan changed it from due at 1000 AM to now.

## 2020-02-14 NOTE — Progress Notes (Signed)
Patient is suppose to be placed on a low bed d/t high fall risk and admitted result of a fall however patient has refused x3 attempts of educating on importance preventing further falls and hospital policy. She stated "I am comfortable in the bed I am in and I do not want to switch beds my neck is already in enough pain."

## 2020-02-14 NOTE — Significant Event (Addendum)
Patient passed out while ambulating ,she was feeling dizzy.  No injuries.  Hemodynamically stable.  Blood pressure okay.  We will hold the discharge for safety purpose.  Discharge planning tomorrow.

## 2020-02-14 NOTE — Progress Notes (Signed)
Patient BP is 165/67. She meets the requirement for the PRN labetalol but per patient she stated that beta blockers make her feel terrible that she would rather not take that. I told her I would recheck her BP in an hour and see if it has increased or decreased any.

## 2020-02-14 NOTE — Progress Notes (Signed)
   02/14/20 1143  Clinical Encounter Type  Visited With Family;Health care provider  Visit Type Initial  Referral From Nurse  Consult/Referral To Chaplain  Chaplain responded to a RR page. When chaplain arrived at the room, staff was helping Pt back into bed. Chaplain asked a nurse if Pt was alright and was told yes, therefore, chaplain left.

## 2020-02-14 NOTE — Progress Notes (Addendum)
Physical Therapy Treatment Patient Details Name: Sherry [name] MRN: [id number] DOB: [birth year] Today's Date: 02/14/2020    History of Present Illness Pt is a 86 y.o. female who [facility] to the [facility] ED after suffering a fall with resultant nondisplaced fracture through the Dens and nondisplaced fractures of the posterior arch of C1 bilaterally also noted, hard C collar placed and to be worn at all times per neurosurgeon. PMH of HTN.    PT Comments    Patient alert, agreeable to PT, 4/10 pain in her neck. Pt demonstrated good progress towards goals this session; was able to ambulate ~77ft total with RW and CGA/supervision. Sit <> stand from EOB And from standard commode with CGA as well, able to stand and brush her teeth with supervision and assistance with set up. Pt in bed with all needs in reach at end of session. Of note, the patient did complain of new L foot numbness that she stated started this AM. Decreased light touch sensation of L5 -S1 dermatomes as well as decreased PF strength, RN notified of this acute change.       Follow Up Recommendations  Home health PT;Supervision - Intermittent;Supervision for mobility/OOB     Equipment Recommendations  None recommended by PT    Recommendations for Other Services       Precautions / Restrictions Precautions Precautions: Fall Required Braces or Orthoses: Cervical Brace Cervical Brace: Hard collar;At all times Restrictions Weight Bearing Restrictions: No    Mobility  Bed Mobility Overal bed mobility: Needs Assistance Bed Mobility: Supine to Sit;Sit to Supine     Supine to sit: HOB elevated;Supervision Sit to supine: Supervision;HOB elevated   General bed mobility comments: physical assist provided as needed for pain management but able to complete without assistance  Transfers Overall transfer level: Needs assistance Equipment used: Rolling walker (2 wheeled) Transfers: Sit to/from Stand Sit to Stand:  Supervision;Min guard         General transfer comment: from EOB x2 and from standard commode  Ambulation/Gait Ambulation/Gait assistance: Min guard;Supervision Gait Distance (Feet):  (~31ft total) Assistive device: Rolling walker (2 wheeled)       General Gait Details: cued for upright posture and RW placement during ambulation   Stairs             Wheelchair Mobility    Modified Rankin (Stroke Patients Only)       Balance Overall balance assessment: Needs assistance Sitting-balance support: Feet supported Sitting balance-Leahy Scale: Good       Standing balance-Leahy Scale: Good Standing balance comment: improved safety noted with UE support                            Cognition Arousal/Alertness: Awake/alert Behavior During Therapy: WFL for tasks assessed/performed Overall Cognitive Status: Within Functional Limits for tasks assessed                                        Exercises      General Comments        Pertinent Vitals/Pain Pain Assessment: 0-10 Pain Score: 4  Pain Location: neck Pain Descriptors / Indicators: Guarding;Aching;Grimacing;Headache Pain Intervention(s): Limited activity within patient's tolerance;Premedicated before session;Monitored during session    Home Living                      Prior Function  PT Goals (current goals can now be found in the care plan section) Progress towards PT goals: Progressing toward goals    Frequency    7X/week      PT Plan Current plan remains appropriate    Co-evaluation              AM-PAC PT "6 Clicks" Mobility   Outcome Measure  Help needed turning from your back to your side while in a flat bed without using bedrails?: A Little Help needed moving from lying on your back to sitting on the side of a flat bed without using bedrails?: A Little Help needed moving to and from a bed to a chair (including a wheelchair)?: A  Little Help needed standing up from a chair using your arms (e.g., wheelchair or bedside chair)?: A Little Help needed to walk in hospital room?: A Little Help needed climbing 3-5 steps with a railing? : A Little 6 Click Score: 18    End of Session Equipment Utilized During Treatment: Gait belt Activity Tolerance: Patient tolerated treatment well Patient left: in bed;with call bell/phone within reach;with bed alarm set Nurse Communication: Mobility status PT Visit Diagnosis: Other abnormalities of gait and mobility (R26.89);Muscle weakness (generalized) (M62.81);Difficulty in walking, not elsewhere classified (R26.2);Pain Pain - Right/Left:  (midline) Pain - part of body:  (cervical)     Time: 0370-9643 PT Time Calculation (min) (ACUTE ONLY): 27 min  Charges:  $Therapeutic Exercise: 23-37 mins                     Lieutenant Diego PT, DPT 12:08 PM,02/14/20

## 2020-02-14 NOTE — Progress Notes (Signed)
OT Contact Note   OT documenting at nurses station when nurse tech came to request assistance in pt's room. Nurse tech noting pt passed out while walking with her. Additional nursing arrived to assess pt. OT assisted staff with getting pt back to bed. Neck brace in place throughout encounter. Bed alarm set when OT left.  Jeni Salles, MPH, MS, OTR/L ascom [phone] 02/14/20, 12:32 PM

## 2020-02-14 NOTE — Progress Notes (Signed)
Writer called to room by NT. When entering room patient was lying on the floor. NT stated was walking with patient from the bathroom and was going back to bed and patient stated she felt dizziness and weakness and was lowered to the floor by NT, she did not fall. Patient was A & O x4 answered all questions asked appropriately. No need c/o of pain, just reported dizziness. VSS and MD notified by came by patient's room to assess her and stated holding on her discharge home. Patient's husband also contacted and made aware of situation, his only concern is he feels she is not safe to come home and requested to speak with case manager about possible placement when discharged. Writer notified case manager about concerns. MD also ordered prn meclizine which was given.

## 2020-02-14 NOTE — Progress Notes (Signed)
Writer obtained orthostatic VS lying 165/62, sitting 142/66 and standing 121/57, unable to stand to check VS after 3 minutes of standing. Patient c/o dizziness during assessment of VS. Patient is also c/o pain to left foot when bearing weight. MD notified of findings.

## 2020-02-14 NOTE — Discharge Summary (Signed)
Physician Discharge Summary  AYMAR WHITFILL YSA:[id number] DOB: 1930/06/03 DOA: 02/11/2020  PCP: Rusty Aus, MD  Admit date: 02/11/2020 Discharge date: 02/14/2020  Admitted From: Home Disposition:  Home  Discharge Condition:Stable CODE STATUS:FULL Diet recommendation: Heart Healthy  Brief/Interim Summary:  Faizah H Johnsonis a 86 y.o.femalewith medical history significant for hyperlipidemia, statin intolerance, neuropathy, anxiety, hypertension presented to the hospital after a mechanical fall at home. She complained of severe neck pain after the fall. She was found to have C1 cervical spine fracture.  She was evaluated by the neurosurgeon, Dr. Lacinda Axon, who said the patient is not a good surgical candidate because of advanced age and therefore recommended conservative management. A neck brace was recommended and this should be worn at all times per neurosurgery. Hospital course remarkable for severe hypertension.  Blood pressure is better today.  Neck pain is well controlled.  PT/OT recommended home health.  She is medically stable for discharge home today.  Following problems were addressed during her hospitalization:   C1 cervical spine fracture: She has been evaluated by Dr. Lacinda Axon, neurosurgeon. Conservative management was recommended because patient is not a good surgical candidate. PT and OT evaluation done.Recommended home health. She needs to follow-up with neurosurgery as an outpatient in 3 weeks.  Patient does not want to take opiates for pain control and says it gives her nausea.  She prefers Tylenol   hypertensive urgency: Continue  losartan, added hydrochlorothiazide and amlodipine.  Hypokalemia: Supplemented and corrected    Discharge Diagnoses:  Principal Problem:   Neck fracture ([place]) Active Problems:   Hyperlipidemia   Neuropathy   Statin intolerance   Anxiety   Hypertensive urgency   Closed C1 fracture [facility])    Discharge  Instructions  Discharge Instructions    Diet - low sodium heart healthy   Complete by: As directed    Discharge instructions   Complete by: As directed    1)Please take prescribed medication as instructed.  Monitor blood pressure at home. 2)Continue to apply the cervical collar 3)Follow up with neurosurgery in 3 weeks.  Name and number of the provider has been attached.   Increase activity slowly   Complete by: As directed      Allergies as of 02/14/2020      Reactions   Codeine    Morphine And Related Other (See Comments)   Respiratory Distress   Percocet [oxycodone-acetaminophen]       Medication List    TAKE these medications   acetaminophen 500 MG tablet Commonly known as: TYLENOL Take 500-1,000 mg by mouth every 6 (six) hours as needed for mild pain or fever.   amLODipine 10 MG tablet Commonly known as: NORVASC Take 1 tablet (10 mg total) by mouth daily. Start taking on: February 15, 2020   B-12 1000 MCG Caps Take 1,000 mcg by mouth daily.   Biotin 5 MG Tabs Take 5 mg by mouth daily.   celecoxib 200 MG capsule Commonly known as: CELEBREX Take 200 mg by mouth daily.   cholecalciferol 25 MCG (1000 UNIT) tablet Commonly known as: VITAMIN D Take 2,000 Units by mouth daily.   hydrochlorothiazide 25 MG tablet Commonly known as: HYDRODIURIL Take 1 tablet (25 mg total) by mouth daily.   losartan 100 MG tablet Commonly known as: COZAAR Take 1 tablet (100 mg total) by mouth daily. Start taking on: February 15, 2020 What changed:   medication strength  how much to take  when to take this  Follow-up Information    Rusty Aus, MD. Schedule an appointment as soon as possible for a visit in 1 week(s).   Specialty: Internal Medicine Contact information: [address] [place] [address] [phone]        Deetta Perla, MD. Schedule an appointment as soon as possible for a visit in 3 week(s).    Specialty: Neurosurgery Contact information: [place] [address] [phone]              Allergies  Allergen Reactions  . Codeine   . Morphine And Related Other (See Comments)    Respiratory Distress   . Percocet [Oxycodone-Acetaminophen]     Consultations:  neurosurgery   Procedures/Studies: CT Head Wo Contrast  Addendum Date: 02/11/2020   ADDENDUM REPORT: 02/11/2020 16:02 ADDENDUM: After further review, there is a nondisplaced fracture through the dens. Nondisplaced fractures of the posterior arch of C1 bilaterally also noted. These results were called by telephone at the time of interpretation on 02/11/2020 at 4:01 pm to provider Quentin Cornwall, who verbally acknowledged these results. Electronically Signed   By: Franchot Gallo M.D.   On: 02/11/2020 16:02   Result Date: 02/11/2020 CLINICAL DATA:  Fall at home.  Head injury. EXAM: CT HEAD WITHOUT CONTRAST CT CERVICAL SPINE WITHOUT CONTRAST TECHNIQUE: Multidetector CT imaging of the head and cervical spine was performed following the standard protocol without intravenous contrast. Multiplanar CT image reconstructions of the cervical spine were also generated. COMPARISON:  CT head 07/06/2019 FINDINGS: CT HEAD FINDINGS Brain: Generalized atrophy unchanged. Negative for hydrocephalus. Chronic microvascular ischemic changes in the white matter unchanged. Chronic lacunar infarction head of caudate on the left unchanged. Negative for acute infarct, hemorrhage, mass. Vascular: Negative for hyperdense vessel Skull: Nondisplaced left occipital bone fracture unchanged from the prior CT. No acute fracture. Sinuses/Orbits: Paranasal sinuses clear. Bilateral cataract extraction. Other: None CT CERVICAL SPINE FINDINGS Alignment: Mild anterolisthesis C2-3 and C3-4. 3 mm anterolisthesis C4-5. Mild anterolisthesis C7-T1 and T1-2. Skull base and vertebrae: Negative for fracture Soft tissues and spinal canal: Negative Disc levels:  Multilevel disc and facet degeneration. Prominent degenerative change and spurring throughout the cervical spine. Multilevel foraminal stenosis due to spurring. Upper chest: Lung apices clear bilaterally. Other: None IMPRESSION: 1. No acute intracranial abnormality. Atrophy and chronic microvascular ischemic change stable 2. Negative for cervical spine fracture. Prominent cervical spondylosis 3. Chronic fracture left occipital bone unchanged. Electronically Signed: By: Franchot Gallo M.D. On: 02/11/2020 10:42   CT Cervical Spine Wo Contrast  Addendum Date: 02/11/2020   ADDENDUM REPORT: 02/11/2020 16:02 ADDENDUM: After further review, there is a nondisplaced fracture through the dens. Nondisplaced fractures of the posterior arch of C1 bilaterally also noted. These results were called by telephone at the time of interpretation on 02/11/2020 at 4:01 pm to provider Quentin Cornwall, who verbally acknowledged these results. Electronically Signed   By: Franchot Gallo M.D.   On: 02/11/2020 16:02   Result Date: 02/11/2020 CLINICAL DATA:  Fall at home.  Head injury. EXAM: CT HEAD WITHOUT CONTRAST CT CERVICAL SPINE WITHOUT CONTRAST TECHNIQUE: Multidetector CT imaging of the head and cervical spine was performed following the standard protocol without intravenous contrast. Multiplanar CT image reconstructions of the cervical spine were also generated. COMPARISON:  CT head 07/06/2019 FINDINGS: CT HEAD FINDINGS Brain: Generalized atrophy unchanged. Negative for hydrocephalus. Chronic microvascular ischemic changes in the white matter unchanged. Chronic lacunar infarction head of caudate on the left unchanged. Negative for acute infarct, hemorrhage,  mass. Vascular: Negative for hyperdense vessel Skull: Nondisplaced left occipital bone fracture unchanged from the prior CT. No acute fracture. Sinuses/Orbits: Paranasal sinuses clear. Bilateral cataract extraction. Other: None CT CERVICAL SPINE FINDINGS Alignment: Mild  anterolisthesis C2-3 and C3-4. 3 mm anterolisthesis C4-5. Mild anterolisthesis C7-T1 and T1-2. Skull base and vertebrae: Negative for fracture Soft tissues and spinal canal: Negative Disc levels: Multilevel disc and facet degeneration. Prominent degenerative change and spurring throughout the cervical spine. Multilevel foraminal stenosis due to spurring. Upper chest: Lung apices clear bilaterally. Other: None IMPRESSION: 1. No acute intracranial abnormality. Atrophy and chronic microvascular ischemic change stable 2. Negative for cervical spine fracture. Prominent cervical spondylosis 3. Chronic fracture left occipital bone unchanged. Electronically Signed: By: Franchot Gallo M.D. On: 02/11/2020 10:42   MR Brain W and Wo Contrast  Result Date: 02/11/2020 CLINICAL DATA:  Fall, dizziness EXAM: MRI HEAD WITHOUT AND WITH CONTRAST TECHNIQUE: Multiplanar, multiecho pulse sequences of the brain and surrounding structures were obtained without and with intravenous contrast. CONTRAST:  80mL GADAVIST GADOBUTROL 1 MMOL/ML IV SOLN COMPARISON:  None. FINDINGS: Brain: There is no acute infarction or intracranial hemorrhage. There is no intracranial mass, mass effect, or edema. There is no hydrocephalus or extra-axial fluid collection. Prominence of the ventricles and sulci reflects mild generalized parenchymal volume loss. There are small chronic infarcts of the basal ganglia bilaterally and left cerebellum. Anterior right frontal encephalomalacia. Additional patchy and confluent areas of T2 hyperintensity in the supratentorial white matter are nonspecific but probably reflect mild to moderate chronic microvascular ischemic changes. No abnormal enhancement. Vascular: Major vessel flow voids at the skull base are preserved. Skull and upper cervical spine: Normal marrow signal is preserved. Sinuses/Orbits: Minor mucosal thickening. Bilateral lens replacements. Other: Sella is unremarkable.  Mastoid air cells are clear.  IMPRESSION: No evidence of acute infarction, hemorrhage, or mass. Chronic infarcts and chronic microvascular ischemic changes. Electronically Signed   By: Macy Mis M.D.   On: 02/11/2020 16:06   MR Cervical Spine Wo Contrast  Result Date: 02/11/2020 CLINICAL DATA:  Fall, negative CT EXAM: MRI CERVICAL SPINE WITHOUT CONTRAST TECHNIQUE: Multiplanar, multisequence MR imaging of the cervical spine was performed. No intravenous contrast was administered. COMPARISON:  None. FINDINGS: Alignment: Mild multilevel degenerative listhesis including anterolisthesis at C2-C3, C3-C4, C6-C7, and C7-T1. Vertebrae: There is a STIR hyperintense fracture cleft through the dens. Mild posterior angulation of the dens. Fluid signal is present at the atlantodental articulation on the sagittal view. This is not covered on axial imaging. There appears to be STIR hyperintensity along the fracture lines of the C1 posterior posterior arch. Cord: No abnormal signal. Posterior Fossa, vertebral arteries, paraspinal tissues: Mild prevertebral soft tissue swelling at C1 and C2 levels. Disc levels: C2-C3: Minimal disc bulge with small endplate osteophytes. Facet hypertrophy. No canal stenosis. Minor foraminal stenosis. C3-C4: Disc bulge with endplate osteophytes and facet and uncovertebral hypertrophy. No canal stenosis. Moderate to marked foraminal stenosis. C4-C5: Disc bulge with endplate osteophytes and left greater than right uncovertebral and facet hypertrophy. No canal or right foraminal stenosis. Moderate to marked left foraminal stenosis. C5-C6: Disc osteophyte complex with uncovertebral and left greater than right facet hypertrophy. Mild canal stenosis. Moderate to marked right and marked left foraminal stenosis. C6-C7: Disc osteophyte complex with uncovertebral greater than facet hypertrophy. Mild canal stenosis. Moderate to marked foraminal stenosis. C7-T1:  Facet hypertrophy.  No canal or foraminal stenosis. IMPRESSION: Acute  type 2 dens fracture with mild posterior angulation. Fluid is present at the atlantodental  articulation, which is only covered on sagittal imaging. Suspected STIR hyperintensity along bilateral nondisplaced C1 posterior arch fracture clefts suggesting acuity. Multilevel degenerative changes. No high-grade canal stenosis. Multilevel foraminal narrowing. These results were called by telephone at the time of interpretation on 02/11/2020 at 3:45 pm to provider Ashok Cordia , who verbally acknowledged these results. Electronically Signed   By: Macy Mis M.D.   On: 02/11/2020 15:57       Subjective:  Patient seen and examined the bedside this morning.  Hemodynamically stable for discharge today.  Discharge Exam: Vitals:   02/14/20 0624 02/14/20 0800  BP: (!) 154/67 (!) 161/61  Pulse: 80 86  Resp:  15  Temp:  97.9 F (36.6 C)  SpO2:  94%   Vitals:   02/14/20 0529 02/14/20 0530 02/14/20 0624 02/14/20 0800  BP: (!) 177/69 (!) 170/63 (!) 154/67 (!) 161/61  Pulse: 89 88 80 86  Resp:    15  Temp:    97.9 F (36.6 C)  TempSrc:      SpO2:    94%  Weight:      Height:        General: Pt is alert, awake, not in acute distress Cardiovascular: RRR, S1/S2 +, no rubs, no gallops Respiratory: CTA bilaterally, no wheezing, no rhonchi Abdominal: Soft, NT, ND, bowel sounds + Extremities: no edema, no cyanosis    The results of significant diagnostics from this hospitalization (including imaging, microbiology, ancillary and laboratory) are listed below for reference.     Microbiology: Recent Results (from the past 240 hour(s))  Resp Panel by RT-PCR (Flu A&B, Covid) Nasopharyngeal Swab     Status: None   Collection Time: 02/11/20  4:11 PM   Specimen: Nasopharyngeal Swab; Nasopharyngeal(NP) swabs in vial transport medium  Result Value Ref Range Status   SARS Coronavirus 2 by RT PCR NEGATIVE NEGATIVE Final    Comment: (NOTE) SARS-CoV-2 target nucleic acids are NOT DETECTED.  The  SARS-CoV-2 RNA is generally detectable in upper respiratory specimens during the acute phase of infection. The lowest concentration of SARS-CoV-2 viral copies this assay can detect is 138 copies/mL. A negative result does not preclude SARS-Cov-2 infection and should not be used as the sole basis for treatment or other patient management decisions. A negative result may occur with  improper specimen collection/handling, submission of specimen other than nasopharyngeal swab, presence of viral mutation(s) within the areas targeted by this assay, and inadequate number of viral copies(<138 copies/mL). A negative result must be combined with clinical observations, patient history, and epidemiological information. The expected result is Negative.  Fact Sheet for Patients:  EntrepreneurPulse.com.au  Fact Sheet for Healthcare Providers:  IncredibleEmployment.be  This test is no t yet approved or cleared by the [place] FDA and  has been authorized for detection and/or diagnosis of SARS-CoV-2 by FDA under an Emergency Use Authorization (EUA). This EUA will remain  in effect (meaning this test can be used) for the duration of the COVID-19 declaration under Section 564(b)(1) of the Act, 21 U.S.C.section 360bbb-3(b)(1), unless the authorization is terminated  or revoked sooner.       Influenza A by PCR NEGATIVE NEGATIVE Final   Influenza B by PCR NEGATIVE NEGATIVE Final    Comment: (NOTE) The Xpert Xpress SARS-CoV-2/FLU/RSV plus assay is intended as an aid in the diagnosis of influenza from Nasopharyngeal swab specimens and should not be used as a sole basis for treatment. Nasal washings and aspirates are unacceptable for Xpert Xpress SARS-CoV-2/FLU/RSV testing.  Fact Sheet for Patients: EntrepreneurPulse.com.au  Fact Sheet for Healthcare Providers: IncredibleEmployment.be  This test is not yet approved or  cleared by the [place] FDA and has been authorized for detection and/or diagnosis of SARS-CoV-2 by FDA under an Emergency Use Authorization (EUA). This EUA will remain in effect (meaning this test can be used) for the duration of the COVID-19 declaration under Section 564(b)(1) of the Act, 21 U.S.C. section 360bbb-3(b)(1), unless the authorization is terminated or revoked.  Performed at [facility], [address] [place], [place] [address]      Labs: BNP (last 3 results) No results for input(s): BNP in the last 8760 hours. Basic Metabolic Panel: Recent Labs  Lab 02/11/20 1242 02/12/20 0343 02/13/20 0529  NA 142 138  --   K 3.7 3.2* 4.3  CL 104 102  --   CO2 27 26  --   GLUCOSE 103* 118*  --   BUN 24* 17  --   CREATININE 0.51 0.51  --   CALCIUM 9.7 9.0  --   MG  --  1.7  --    Liver Function Tests: Recent Labs  Lab 02/11/20 1242 02/12/20 0343  AST 22 20  ALT 17 16  ALKPHOS 55 54  BILITOT 0.7 1.0  PROT 7.0 6.5  ALBUMIN 3.9 3.6   No results for input(s): LIPASE, AMYLASE in the last 168 hours. No results for input(s): AMMONIA in the last 168 hours. CBC: Recent Labs  Lab 02/11/20 1242 02/12/20 0343  WBC 7.7 7.7  NEUTROABS 6.0  --   HGB 13.4 12.3  HCT 39.8 37.3  MCV 87.9 88.4  PLT 217 221   Cardiac Enzymes: No results for input(s): CKTOTAL, CKMB, CKMBINDEX, TROPONINI in the last 168 hours. BNP: Invalid input(s): POCBNP CBG: No results for input(s): GLUCAP in the last 168 hours. D-Dimer No results for input(s): DDIMER in the last 72 hours. Hgb A1c No results for input(s): HGBA1C in the last 72 hours. Lipid Profile No results for input(s): CHOL, HDL, LDLCALC, TRIG, CHOLHDL, LDLDIRECT in the last 72 hours. Thyroid function studies No results for input(s): TSH, T4TOTAL, T3FREE, THYROIDAB in the last 72 hours.  Invalid input(s): FREET3 Anemia work up No results for input(s): VITAMINB12, FOLATE, FERRITIN, TIBC, IRON, RETICCTPCT in the  last 72 hours. Urinalysis    Component Value Date/Time   COLORURINE YELLOW (A) 12/16/2014 0138   APPEARANCEUR CLEAR (A) 12/16/2014 0138   LABSPEC 1.010 12/16/2014 0138   PHURINE 6.0 12/16/2014 0138   GLUCOSEU NEGATIVE 12/16/2014 0138   HGBUR NEGATIVE 12/16/2014 0138   BILIRUBINUR NEGATIVE 12/16/2014 0138   KETONESUR TRACE (A) 12/16/2014 0138   PROTEINUR NEGATIVE 12/16/2014 0138   NITRITE NEGATIVE 12/16/2014 0138   LEUKOCYTESUR NEGATIVE 12/16/2014 0138   Sepsis Labs Invalid input(s): PROCALCITONIN,  WBC,  LACTICIDVEN Microbiology Recent Results (from the past 240 hour(s))  Resp Panel by RT-PCR (Flu A&B, Covid) Nasopharyngeal Swab     Status: None   Collection Time: 02/11/20  4:11 PM   Specimen: Nasopharyngeal Swab; Nasopharyngeal(NP) swabs in vial transport medium  Result Value Ref Range Status   SARS Coronavirus 2 by RT PCR NEGATIVE NEGATIVE Final    Comment: (NOTE) SARS-CoV-2 target nucleic acids are NOT DETECTED.  The SARS-CoV-2 RNA is generally detectable in upper respiratory specimens during the acute phase of infection. The lowest concentration of SARS-CoV-2 viral copies this assay can detect is 138 copies/mL. A negative result does not preclude SARS-Cov-2 infection and should not be used as  the sole basis for treatment or other patient management decisions. A negative result may occur with  improper specimen collection/handling, submission of specimen other than nasopharyngeal swab, presence of viral mutation(s) within the areas targeted by this assay, and inadequate number of viral copies(<138 copies/mL). A negative result must be combined with clinical observations, patient history, and epidemiological information. The expected result is Negative.  Fact Sheet for Patients:  EntrepreneurPulse.com.au  Fact Sheet for Healthcare Providers:  IncredibleEmployment.be  This test is no t yet approved or cleared by the [place] FDA  and  has been authorized for detection and/or diagnosis of SARS-CoV-2 by FDA under an Emergency Use Authorization (EUA). This EUA will remain  in effect (meaning this test can be used) for the duration of the COVID-19 declaration under Section 564(b)(1) of the Act, 21 U.S.C.section 360bbb-3(b)(1), unless the authorization is terminated  or revoked sooner.       Influenza A by PCR NEGATIVE NEGATIVE Final   Influenza B by PCR NEGATIVE NEGATIVE Final    Comment: (NOTE) The Xpert Xpress SARS-CoV-2/FLU/RSV plus assay is intended as an aid in the diagnosis of influenza from Nasopharyngeal swab specimens and should not be used as a sole basis for treatment. Nasal washings and aspirates are unacceptable for Xpert Xpress SARS-CoV-2/FLU/RSV testing.  Fact Sheet for Patients: EntrepreneurPulse.com.au  Fact Sheet for Healthcare Providers: IncredibleEmployment.be  This test is not yet approved or cleared by the [place] FDA and has been authorized for detection and/or diagnosis of SARS-CoV-2 by FDA under an Emergency Use Authorization (EUA). This EUA will remain in effect (meaning this test can be used) for the duration of the COVID-19 declaration under Section 564(b)(1) of the Act, 21 U.S.C. section 360bbb-3(b)(1), unless the authorization is terminated or revoked.  Performed at [facility], [address], [place], [place] [address]     Please note: You were cared for by a hospitalist during your hospital stay. Once you are discharged, your primary care physician will handle any further medical issues. Please note that NO REFILLS for any discharge medications will be authorized once you are discharged, as it is imperative that you return to your primary care physician (or establish a relationship with a primary care physician if you do not have one) for your post hospital discharge needs so that they can reassess your need for medications  and monitor your lab values.    Time coordinating discharge: 40 minutes  SIGNED:   Shelly Coss, MD  [facility] 02/14/2020, 11:03 AM Pager [phone]  If 7PM-7AM, please contact night-coverage [email or web address] Password [email or web address]

## 2020-02-14 NOTE — TOC Progression Note (Signed)
Transition of Care [facility]) - Progression Note    Patient Details  Name: Sherry [name] MRN: [id number] Date of Birth: Nov 16, 1930  [facility] of [facility]) CM/SW [place], LCSW Phone Number: 02/14/2020, 1:41 PM  Clinical Narrative:   CSW updated by LPN that patient's family wants her to go to SNF rehab. Patient would have to agree to rehab since she is alert and oriented x 4. PT would also have to recommend SNF rehab for insurance to cover it. MD to order PT reevaluation.   Expected Discharge Plan: Home/Self Care Barriers to Discharge: Continued Medical Work up  Expected Discharge Plan and Services Expected Discharge Plan: Home/Self Care       Living arrangements for the past 2 months: Single Family Home Expected Discharge Date: 02/14/20                                     Social Determinants of Health (SDOH) Interventions    Readmission Risk Interventions No flowsheet data found.

## 2020-02-15 ENCOUNTER — Inpatient Hospital Stay: Payer: Medicare Other

## 2020-02-15 LAB — BASIC METABOLIC PANEL
Anion gap: 12 (ref 5–15)
BUN: 24 mg/dL — ABNORMAL HIGH (ref 8–23)
CO2: 27 mmol/L (ref 22–32)
Calcium: 9.3 mg/dL (ref 8.9–10.3)
Chloride: 99 mmol/L (ref 98–111)
Creatinine, Ser: 0.72 mg/dL (ref 0.44–1.00)
GFR, Estimated: >60 mL/min (ref >60)
Glucose, Bld: 115 mg/dL — ABNORMAL HIGH (ref 70–99)
Potassium: 3.9 mmol/L (ref 3.5–5.1)
Sodium: 138 mmol/L (ref 135–145)

## 2020-02-15 MED ORDER — BISACODYL 10 MG RE SUPP
10.0000 mg | Freq: Once | RECTAL | Status: AC
  Administered 2020-02-15: 15:00:00 10 mg via RECTAL
  Filled 2020-02-15: qty 1

## 2020-02-15 MED ORDER — LOSARTAN POTASSIUM 50 MG PO TABS
50.0000 mg | ORAL_TABLET | Freq: Every day | ORAL | Status: DC
  Administered 2020-02-15 – 2020-02-17 (×3): 50 mg via ORAL
  Filled 2020-02-15 (×3): qty 1

## 2020-02-15 MED ORDER — MECLIZINE HCL 25 MG PO TABS
25.0000 mg | ORAL_TABLET | Freq: Four times a day (QID) | ORAL | Status: DC | PRN
  Filled 2020-02-15: qty 1

## 2020-02-15 MED ORDER — FLEET ENEMA 7-19 GM/118ML RE ENEM
1.0000 | ENEMA | Freq: Once | RECTAL | Status: AC
  Administered 2020-02-15: 17:00:00 1 via RECTAL

## 2020-02-15 MED ORDER — AMLODIPINE BESYLATE 10 MG PO TABS
10.0000 mg | ORAL_TABLET | Freq: Every day | ORAL | Status: DC
  Administered 2020-02-16 – 2020-02-17 (×2): 10 mg via ORAL
  Filled 2020-02-15 (×3): qty 1

## 2020-02-15 MED ORDER — POLYETHYLENE GLYCOL 3350 17 G PO PACK
17.0000 g | PACK | Freq: Every day | ORAL | Status: DC
  Administered 2020-02-16: 09:00:00 17 g via ORAL
  Filled 2020-02-15 (×2): qty 1

## 2020-02-15 NOTE — Progress Notes (Signed)
PROGRESS NOTE    Sherry [name]  WUJ:[id number] DOB: 1930/07/06 DOA: 02/11/2020 PCP: Rusty Aus, MD   Chief Complain:Fall  Brief Narrative:  Sherry [name] is a 86 y.o. female with medical history significant for hyperlipidemia, statin intolerance, neuropathy, anxiety, hypertension presented to the hospital after a mechanical fall at home.  She complained of severe neck pain after the fall. She was found to have C1 cervical spine fracture.  She was treated with analgesics.  She was evaluated by the neurosurgeon, Dr. Lacinda Axon, who said the patient is not a good surgical candidate because of advanced age and therefore recommended conservative management.  A neck brace was recommended and this should be worn at all times per neurosurgeon. Hospital course remarkable for severe hypertension, dizziness.  PT/OT now recommending skilled nursing facility.  She is waiting for bed.  Assessment & Plan C1 cervical spine fracture: She has been evaluated by Dr. Lacinda Axon, neurosurgeon. Conservative management was recommended because patient is not a good surgical candidate. PT and OT evaluationdone.Recommended home health. She needs to follow-up with neurosurgery as an outpatientin3 weeks.  Patient does not want to take opiates for pain control and says it gives her nausea.  She prefers Tylenol   hypertensive urgency: Continue  losartan, added amlodipine.  Monitor blood pressure  Dizziness: Likely positional vertigo.  Continue meclizine as needed.  Since dizziness continued this morning, will do MRI to rule out cerebellar stroke. She was orthostatic yesterday but orthostatic vitals repeated this morning were negative.  Left knee pain/left foot pain: X-rays showed degenerative/arthritic changes.  No fracture or dislocation.  Continue supportive care  Hypokalemia:Supplemented and corrected    Principal Problem:   Neck fracture ([place]) Active Problems:   Hyperlipidemia   Neuropathy   Statin  intolerance   Anxiety   Hypertensive urgency   Closed C1 fracture ([place])            DVT prophylaxis:Lovenox Code Status: Full Family Communication: Husband on phone on 02/14/2019 Status is: Inpatient  Remains inpatient appropriate because:Inpatient level of care appropriate due to severity of illness   Dispo: The patient is from: Home              Anticipated d/c is to: Skilled nursing facility              Anticipated d/c date is: 1 day              Patient currently is medically stable for discharge    Consultants: Neurosurgery  Procedures:None  Antimicrobials:  Anti-infectives (From admission, onward)   None      Subjective:  Patient seen and examined at the bedside this morning.  Blood pressure is better today.  Orthostatic vitals done this morning were negative.  Still complains of dizziness.  Complains of left knee and left foot pain today.  She is agreeable for rehab now  Objective: Vitals:   02/14/20 2356 02/15/20 0445 02/15/20 0916 02/15/20 1111  BP: (!) 141/53 (!) 164/64 (!) 165/69 (!) 161/61  Pulse: 84 81 (!) 104 93  Resp: 15 15 17 18   Temp: 98.2 F (36.8 C) 98.1 F (36.7 C) 98 F (36.7 C) 99.5 F (37.5 C)  TempSrc:    Oral  SpO2: 93% 96% 96% 96%  Weight:      Height:        Intake/Output Summary (Last 24 hours) at 02/15/2020 1255 Last data filed at 02/15/2020 0956 Gross per 24 hour  Intake 1190.29 ml  Output 450  ml  Net 740.29 ml   Filed Weights   02/11/20 1002  Weight: 63.5 kg    Examination:  General exam: Not in distress, deconditioned, debilitated elderly female HEENT: Cervical collar Respiratory system: Bilateral equal air entry, normal vesicular breath sounds, no wheezes or crackles  Cardiovascular system: S1 & S2 heard, RRR. No JVD, murmurs, rubs, gallops or clicks. Gastrointestinal system: Abdomen is nondistended, soft and nontender. No organomegaly or masses felt. Normal bowel sounds heard. Central nervous system: Alert  and oriented. No focal neurological deficits. Extremities: No edema, no clubbing ,no cyanosi Skin: No rashes, lesions or ulcers,no icterus ,no pallor     Data Reviewed: I have personally reviewed following labs and imaging studies  CBC: Recent Labs  Lab 02/11/20 1242 02/12/20 0343  WBC 7.7 7.7  NEUTROABS 6.0  --   HGB 13.4 12.3  HCT 39.8 37.3  MCV 87.9 88.4  PLT 217 517   Basic Metabolic Panel: Recent Labs  Lab 02/11/20 1242 02/12/20 0343 02/13/20 0529 02/15/20 0833  NA 142 138  --  138  K 3.7 3.2* 4.3 3.9  CL 104 102  --  99  CO2 27 26  --  27  GLUCOSE 103* 118*  --  115*  BUN 24* 17  --  24*  CREATININE 0.51 0.51  --  0.72  CALCIUM 9.7 9.0  --  9.3  MG  --  1.7  --   --    GFR: Estimated Creatinine Clearance: 42.8 mL/min (by C-G formula based on SCr of 0.72 mg/dL). Liver Function Tests: Recent Labs  Lab 02/11/20 1242 02/12/20 0343  AST 22 20  ALT 17 16  ALKPHOS 55 54  BILITOT 0.7 1.0  PROT 7.0 6.5  ALBUMIN 3.9 3.6   No results for input(s): LIPASE, AMYLASE in the last 168 hours. No results for input(s): AMMONIA in the last 168 hours. Coagulation Profile: No results for input(s): INR, PROTIME in the last 168 hours. Cardiac Enzymes: No results for input(s): CKTOTAL, CKMB, CKMBINDEX, TROPONINI in the last 168 hours. BNP (last 3 results) No results for input(s): PROBNP in the last 8760 hours. HbA1C: No results for input(s): HGBA1C in the last 72 hours. CBG: No results for input(s): GLUCAP in the last 168 hours. Lipid Profile: No results for input(s): CHOL, HDL, LDLCALC, TRIG, CHOLHDL, LDLDIRECT in the last 72 hours. Thyroid Function Tests: No results for input(s): TSH, T4TOTAL, FREET4, T3FREE, THYROIDAB in the last 72 hours. Anemia Panel: No results for input(s): VITAMINB12, FOLATE, FERRITIN, TIBC, IRON, RETICCTPCT in the last 72 hours. Sepsis Labs: No results for input(s): PROCALCITON, LATICACIDVEN in the last 168 hours.  Recent Results (from  the past 240 hour(s))  Resp Panel by RT-PCR (Flu A&B, Covid) Nasopharyngeal Swab     Status: None   Collection Time: 02/11/20  4:11 PM   Specimen: Nasopharyngeal Swab; Nasopharyngeal(NP) swabs in vial transport medium  Result Value Ref Range Status   SARS Coronavirus 2 by RT PCR NEGATIVE NEGATIVE Final    Comment: (NOTE) SARS-CoV-2 target nucleic acids are NOT DETECTED.  The SARS-CoV-2 RNA is generally detectable in upper respiratory specimens during the acute phase of infection. The lowest concentration of SARS-CoV-2 viral copies this assay can detect is 138 copies/mL. A negative result does not preclude SARS-Cov-2 infection and should not be used as the sole basis for treatment or other patient management decisions. A negative result may occur with  improper specimen collection/handling, submission of specimen other than nasopharyngeal swab, presence of viral  mutation(s) within the areas targeted by this assay, and inadequate number of viral copies(<138 copies/mL). A negative result must be combined with clinical observations, patient history, and epidemiological information. The expected result is Negative.  Fact Sheet for Patients:  EntrepreneurPulse.com.au  Fact Sheet for Healthcare Providers:  IncredibleEmployment.be  This test is no t yet approved or cleared by the [place] FDA and  has been authorized for detection and/or diagnosis of SARS-CoV-2 by FDA under an Emergency Use Authorization (EUA). This EUA will remain  in effect (meaning this test can be used) for the duration of the COVID-19 declaration under Section 564(b)(1) of the Act, 21 U.S.C.section 360bbb-3(b)(1), unless the authorization is terminated  or revoked sooner.       Influenza A by PCR NEGATIVE NEGATIVE Final   Influenza B by PCR NEGATIVE NEGATIVE Final    Comment: (NOTE) The Xpert Xpress SARS-CoV-2/FLU/RSV plus assay is intended as an aid in the diagnosis of  influenza from Nasopharyngeal swab specimens and should not be used as a sole basis for treatment. Nasal washings and aspirates are unacceptable for Xpert Xpress SARS-CoV-2/FLU/RSV testing.  Fact Sheet for Patients: EntrepreneurPulse.com.au  Fact Sheet for Healthcare Providers: IncredibleEmployment.be  This test is not yet approved or cleared by the [place] FDA and has been authorized for detection and/or diagnosis of SARS-CoV-2 by FDA under an Emergency Use Authorization (EUA). This EUA will remain in effect (meaning this test can be used) for the duration of the COVID-19 declaration under Section 564(b)(1) of the Act, 21 U.S.C. section 360bbb-3(b)(1), unless the authorization is terminated or revoked.  Performed at [facility], [address], [place], [place] [address]          Radiology Studies: DG Knee 1-2 Views Left  Result Date: 02/15/2020 CLINICAL DATA:  Pain following recent fall EXAM: LEFT KNEE - 1-2 VIEW COMPARISON:  None. FINDINGS: Frontal and lateral views were obtained. There is no fracture or dislocation. There is a small joint effusion. There is moderate generalized joint space narrowing with spurring in all compartments. There is chondrocalcinosis. No erosion. IMPRESSION: No fracture or dislocation. Small joint effusion. Generalized osteoarthritic change. There is chondrocalcinosis which may be seen with osteoarthritis or calcium pyrophosphate deposition disease. Electronically Signed   By: Lowella Grip III M.D.   On: 02/15/2020 10:07   DG Foot 2 Views Left  Result Date: 02/15/2020 CLINICAL DATA:  Pain following fall EXAM: LEFT FOOT - 2 VIEW COMPARISON:  None. FINDINGS: Frontal and lateral views were obtained. No fracture or dislocation. There is spurring in the dorsal midfoot. No appreciable joint space narrowing. There is a small inferior calcaneal spur. No erosion. IMPRESSION: Spurring dorsal midfoot.  Small inferior calcaneal spur. No fracture or dislocation. Electronically Signed   By: Lowella Grip III M.D.   On: 02/15/2020 10:08        Scheduled Meds: . amLODipine  10 mg Oral Daily  . enoxaparin (LOVENOX) injection  40 mg Subcutaneous Q24H  . losartan  50 mg Oral Daily  . polyethylene glycol  17 g Oral Daily  . promethazine  12.5 mg Intravenous Once   Continuous Infusions:   LOS: 3 days    Time spent: 25 mins,More than 50% of that time was spent in counseling and/or coordination of care.      Shelly Coss, MD [facility] P12/17/2021, 12:55 PM

## 2020-02-15 NOTE — NC FL2 (Signed)
[place] LEVEL OF CARE SCREENING TOOL     IDENTIFICATION  Patient Name: Sherry [name] Birthdate: Oct 11, 1930 Sex: female Admission Date (Current Location): 02/11/2020  [place] and [place] Number:  Engineering geologist and Address:  [facility], [address] [place], [place] [address]      Provider Number: [id number]  Attending Physician Name and Address:  Shelly Coss, MD  Relative Name and Phone Number:  Sampley,james Sr. (Spouse)   [phone] (Home Phone)    Current Level of Care: Hospital Recommended Level of Care: [place] Prior Approval Number:    Date Approved/Denied:   PASRR Number:    Discharge Plan: SNF    Current Diagnoses: Patient Active Problem List   Diagnosis Date Noted   Hypertensive urgency 02/12/2020   Closed C1 fracture ([place]) 02/12/2020   Neck fracture ([place]) 02/11/2020   Coronary artery calcification seen on CAT scan 05/05/2017   Chest pain with moderate risk for cardiac etiology 05/05/2017   Shortness of breath 05/05/2017   Hyperlipidemia 09/25/2016   Neuropathy 09/25/2016   Statin intolerance 09/25/2016   Anxiety 09/25/2016   Palpitations 09/25/2016    Orientation RESPIRATION BLADDER Height & Weight     Self,Time,Situation,Place  Normal Continent Weight: 139 lb 15.9 oz (63.5 kg) Height:  5\' 4"  (162.6 cm)  BEHAVIORAL SYMPTOMS/MOOD NEUROLOGICAL BOWEL NUTRITION STATUS      Continent Diet (soft diet, thin liquids)  AMBULATORY STATUS COMMUNICATION OF NEEDS Skin   Limited Assist Verbally Normal                       Personal Care Assistance Level of Assistance  Bathing,Feeding,Dressing Bathing Assistance: Maximum assistance Feeding assistance: Limited assistance Dressing Assistance: Maximum assistance     Functional Limitations Info             SPECIAL CARE FACTORS FREQUENCY  PT (By licensed PT),OT (By licensed OT)     PT Frequency: 5 x/week OT  Frequency: 5 x/week            Contractures      Additional Factors Info  Code Status,Allergies Code Status Info: full code Allergies Info: codeine, morphine and related, percocet (oxycodone-acetaminophen)           Current Medications (02/15/2020):  This is the current hospital active medication list Current Facility-Administered Medications  Medication Dose Route Frequency Provider Last Rate Last Admin   acetaminophen (TYLENOL) tablet 650 mg  650 mg Oral Q6H PRN Shawna Clamp, MD   650 mg at 02/14/20 2020   Or   acetaminophen (TYLENOL) suppository 650 mg  650 mg Rectal Q6H PRN Shawna Clamp, MD       amLODipine (NORVASC) tablet 10 mg  10 mg Oral Daily Adhikari, Amrit, MD       enoxaparin (LOVENOX) injection 40 mg  40 mg Subcutaneous Q24H Shawna Clamp, MD   40 mg at 02/14/20 2022   fentaNYL (SUBLIMAZE) injection 50 mcg  50 mcg Intravenous Q2H PRN Jennye Boroughs, MD       hydrALAZINE (APRESOLINE) injection 10 mg  10 mg Intravenous Q6H PRN Sharion Settler, NP       HYDROcodone-acetaminophen (NORCO/VICODIN) 5-325 MG per tablet 1 tablet  1 tablet Oral Q6H PRN Shawna Clamp, MD   1 tablet at 02/11/20 2334   labetalol (NORMODYNE) injection 10 mg  10 mg Intravenous Q2H PRN Shelly Coss, MD       losartan (COZAAR) tablet 50 mg  50 mg  Oral Daily Shelly Coss, MD   50 mg at 02/15/20 0916   meclizine (ANTIVERT) tablet 25 mg  25 mg Oral Q6H PRN Shelly Coss, MD       ondansetron (ZOFRAN) tablet 4 mg  4 mg Oral Q6H PRN Shawna Clamp, MD   4 mg at 02/14/20 0305   Or   ondansetron (ZOFRAN) injection 4 mg  4 mg Intravenous Q6H PRN Shawna Clamp, MD   4 mg at 02/11/20 1932   promethazine (PHENERGAN) injection 12.5 mg  12.5 mg Intravenous Once Jennye Boroughs, MD       senna-docusate (Senokot-S) tablet 1 tablet  1 tablet Oral QHS PRN Shawna Clamp, MD       traMADol Veatrice [place]bon) tablet 50 mg  50 mg Oral Q6H PRN Jennye Boroughs, MD         Discharge  Medications: Please see discharge summary for a list of discharge medications.  Relevant Imaging Results:  Relevant Lab Results:   Additional Information SS #: [id number]  [place], LCSW

## 2020-02-15 NOTE — Care Management Important Message (Signed)
Important Message  Patient Details  Name: Sherry [name] MRN: [id number] Date of Birth: [birth year]   Medicare Important Message Given:  N/A - LOS <3 / Initial given by admissions     [name] 02/15/2020, 7:56 AM

## 2020-02-15 NOTE — TOC Progression Note (Addendum)
Transition of Care [facility]) - Progression Note    Patient Details  Name: Sherry [name] MRN: [id number] Date of Birth: 09/01/1930  Transition of Care [facility]) CM/SW [place], LCSW Phone Number: 02/15/2020, 10:25 AM  Clinical Narrative:   CSW spoke to patient at bedside. PT now recommending SNF. Patient reported she would consider SNF rehab but only if she can go to [facility]. CSW explained this is based on bed availability and patient verbalized understanding. Reviewed other SNF options and patient refuses any SNF other than [facility]. Patient reported she has had both of her COVID vaccines. Patient reported she would be agreeable to [facility] if [facility] is not available.   Home address is [address], [place], [place] reached out to [employer] who reported she will review patient's information, unsure if she would have any beds or not. Per Dr. Tawanna Solo, patient would be medically ready when we have a bed available. Referral sent to [facility] and waiting for response.  11:15- Per Seth Bake no beds at [facility] until Sunday. Asked MD, PT, RN if patient will need to stay until bed is available on Sunday or if patient should DC home with Home Health today.  1:35- Per Dr. Tawanna Solo ok for patient to stay until bed is available on Sunday. Per Seth Bake at [facility], patient can definitely come on Sunday. Seth Bake needs a copy of patient's COVID Vaccine Card and needs to meet with spouse or son for paperwork. Call to patient's room and provided update. Left voicemails for son and spouse. Will leave handoff for Weekend TOC to follow up on DC to [facility] on Sunday.  1:45- Spoke to patient's son who agrees with plan for [facility] on Sunday and said he will call Seth Bake now regarding Admissions Paperwork.      Expected Discharge Plan: Home/Self Care Barriers to Discharge: Continued Medical Work up  Expected Discharge Plan and Services Expected  Discharge Plan: Home/Self Care       Living arrangements for the past 2 months: Single Family Home Expected Discharge Date: 02/14/20                                     Social Determinants of Health (SDOH) Interventions    Readmission Risk Interventions No flowsheet data found.

## 2020-02-15 NOTE — Progress Notes (Signed)
Occupational Therapy Treatment Patient Details Name: Sherry [name] MRN: [id number] DOB: [birth year] Today's Date: 02/15/2020    History of present illness Pt is a 86 y.o. female who presents to the [facility] ED after suffering a fall with resultant nondisplaced fracture through the Dens and nondisplaced fractures of the posterior arch of C1 bilaterally also noted, hard C collar placed and to be worn at all times per neurosurgeon. PMH of HTN.   OT comments  Pt seen for OT tx this date. Pt in bed, just returned from MRI. Pt endorses neck pain after MRI 2/2 moving around associated with imaging. Brace assessed and repositioned slightly for improved fit and comfort. Pt instructed in bed mobility. OOB deferred 2/2 pain. RN notified and brought pain medication during session. Pt tolerated slowly increasing HOB from 36* to 59* and set up for self feeding of lunch. CM called during session. Pt notified she is going to [facility] on Sunday. Pt very happy to hear the news. Pt motivated to improve and eager to get to rehab for continued therapy to that end. Given recent changes in pt's pain, BP, dizziness, pt unsafe to return home at this time. Recommendation updated to SNF for rehab prior to return home.    Follow Up Recommendations  SNF;Other (comment) (supervision for mobility/ADL)    Equipment Recommendations  3 in 1 bedside commode    Recommendations for Other Services      Precautions / Restrictions Precautions Precautions: Fall Required Braces or Orthoses: Cervical Brace Cervical Brace: Hard collar;At all times Restrictions Weight Bearing Restrictions: No       Mobility Bed Mobility               General bed mobility comments: with HOB lowered and pt education in hand/foot placement, pt able to scoot herself up in bed, slowly, but without physical assist for improved comfort once HOB elevated again  Transfers                 General transfer comment: deferred 2/2 pain     Balance                                           ADL either performed or assessed with clinical judgement   ADL Overall ADL's : Needs assistance/impaired                                       General ADL Comments: Pt currently requires set up assist for self feeding and grooming tasks in bed (pain limited with movement after coming back from MRI recently), anticipate significant assist required for all LB ADL tasks and UB dressing     Vision Patient Visual Report: No change from baseline     Perception     Praxis      Cognition Arousal/Alertness: Awake/alert Behavior During Therapy: WFL for tasks assessed/performed Overall Cognitive Status: Within Functional Limits for tasks assessed                                          Exercises Other Exercises Other Exercises: Pt educated in bed mobility, readjusted neck brace for improved positioning/comfort, worked on increasing HOB to eat, set  up for self feeding   Shoulder Instructions       General Comments      Pertinent Vitals/ Pain       Pain Assessment: 0-10 Pain Score: 7  Pain Location: with movement in neck, also some pain in L knee and L ankle/foot Pain Descriptors / Indicators: Guarding;Aching;Grimacing Pain Intervention(s): Limited activity within patient's tolerance;Monitored during session;Repositioned;Patient requesting pain meds-RN notified;RN gave pain meds during session  Home Living                                          Prior Functioning/Environment              Frequency  Min 1X/week        Progress Toward Goals  OT Goals(current goals can now be found in the care plan section)  Progress towards OT goals: Not progressing toward goals - comment  Acute Rehab OT Goals Patient Stated Goal: go to rehab to get better so I can go home OT Goal Formulation: With patient Time For Goal Achievement: 02/26/20 Potential to  Achieve Goals: Good  Plan Discharge plan needs to be updated;Frequency remains appropriate    Co-evaluation                 AM-PAC OT "6 Clicks" Daily Activity     Outcome Measure   Help from another person eating meals?: None Help from another person taking care of personal grooming?: None Help from another person toileting, which includes using toliet, bedpan, or urinal?: A Little Help from another person bathing (including washing, rinsing, drying)?: A Lot Help from another person to put on and taking off regular upper body clothing?: A Little Help from another person to put on and taking off regular lower body clothing?: A Lot 6 Click Score: 18    End of Session    OT Visit Diagnosis: Unsteadiness on feet (R26.81);Muscle weakness (generalized) (M62.81)   Activity Tolerance Patient limited by pain   Patient Left in bed;with call bell/phone within reach;with bed alarm set;Other (comment) (brace in place)   Nurse Communication Patient requests pain meds        Time: 9030-0923 OT Time Calculation (min): 45 min  Charges: OT General Charges $OT Visit: 1 Visit OT Treatments $Self Care/Home Management : 38-52 mins  Jeni Salles, MPH, MS, OTR/L ascom [phone] 02/15/20, 2:17 PM

## 2020-02-15 NOTE — Progress Notes (Signed)
Physical Therapy Treatment Patient Details Name: Sherry [name] MRN: [id number] DOB: 25-Sep-1930 Today's Date: 02/15/2020    History of Present Illness Pt is a 86 y.o. female who presents to the [facility] ED after suffering a fall with resultant nondisplaced fracture through the Dens and nondisplaced fractures of the posterior arch of C1 bilaterally also noted, hard C collar placed and to be worn at all times per neurosurgeon. PMH of HTN.    PT Comments    Pt in bed, agreeable to PT this AM. BP assessed throughout session as well as orthostatic vitals (see flowsheet details, RN updated). Pt unable to perform mobility today without complaints of dizziness, limited by pain and dizziness. MinA for bed mobility, and pt able to sit <> stand twice from EOB with RW and CGA. Pt unable to tolerate >94minutes in standing due to her symptoms,returned to sitting EOB. Pt still complaining of L foot pain and L knee pain, RN and MD updated. Discharge recommendations updated due to change in functional mobility and safety concerns; recommendation is SNF to return pt to PLOF.      Follow Up Recommendations  Supervision for mobility/OOB;SNF     Equipment Recommendations  None recommended by PT    Recommendations for Other Services       Precautions / Restrictions Precautions Precautions: Fall Precaution Comments: dizziness Required Braces or Orthoses: Cervical Brace Cervical Brace: Hard collar;At all times Restrictions Weight Bearing Restrictions: No    Mobility  Bed Mobility Overal bed mobility: Needs Assistance Bed Mobility: Supine to Sit;Sit to Supine     Supine to sit: Min assist Sit to supine: Min assist   General bed mobility comments: physical assist provided this AM due to increased pain and pt fatigue  Transfers Overall transfer level: Needs assistance Equipment used: Rolling walker (2 wheeled) Transfers: Sit to/from Stand Sit to Stand: Min guard         General transfer  comment: from EOB x2  Ambulation/Gait             General Gait Details: deferred due to safety concerns, pt dizzy   Stairs             Wheelchair Mobility    Modified Rankin (Stroke Patients Only)       Balance Overall balance assessment: Needs assistance Sitting-balance support: Feet supported Sitting balance-Leahy Scale: Good       Standing balance-Leahy Scale: Fair Standing balance comment: reliant on UE support                            Cognition Arousal/Alertness: Awake/alert Behavior During Therapy: WFL for tasks assessed/performed Overall Cognitive Status: Within Functional Limits for tasks assessed                                        Exercises Other Exercises Other Exercises: Orthostatic  vitals assessed in two rounds this AM. RN aware of results and inputting into vitals section    General Comments        Pertinent Vitals/Pain Pain Location: Pt reported at rest when she is not moving her neck pain is 0/10. With movement pt demonstrates moderate pain signs/symptoms Pain Descriptors / Indicators: Guarding;Aching;Grimacing;Headache Pain Intervention(s): Limited activity within patient's tolerance;Monitored during session;Repositioned    Home Living  Prior Function            PT Goals (current goals can now be found in the care plan section) Progress towards PT goals: Not progressing toward goals - comment (limited progress towards goals due to pt dizziness this session)    Frequency    7X/week      PT Plan Discharge plan needs to be updated    Co-evaluation              AM-PAC PT "6 Clicks" Mobility   Outcome Measure  Help needed turning from your back to your side while in a flat bed without using bedrails?: A Little Help needed moving from lying on your back to sitting on the side of a flat bed without using bedrails?: A Little Help needed moving to and from a  bed to a chair (including a wheelchair)?: A Little Help needed standing up from a chair using your arms (e.g., wheelchair or bedside chair)?: A Little Help needed to walk in hospital room?: A Lot Help needed climbing 3-5 steps with a railing? : A Lot 6 Click Score: 16    End of Session Equipment Utilized During Treatment: Gait belt Activity Tolerance: Patient tolerated treatment well Patient left: in bed;with call bell/phone within reach;with bed alarm set Nurse Communication: Mobility status PT Visit Diagnosis: Other abnormalities of gait and mobility (R26.89);Muscle weakness (generalized) (M62.81);Difficulty in walking, not elsewhere classified (R26.2);Pain Pain - Right/Left:  (midline) Pain - part of body:  (cervical)     Time: 1443-1540 PT Time Calculation (min) (ACUTE ONLY): 34 min  Charges:  $Therapeutic Activity: 23-37 mins                    Lieutenant Diego PT, DPT 9:26 AM,02/15/20

## 2020-02-16 NOTE — Progress Notes (Signed)
Physical Therapy Treatment Patient Details Name: Sherry [name] MRN: [id number] DOB: Mar 08, 1930 Today's Date: 02/16/2020    History of Present Illness Pt is a 86 y.o. female who presents to the [facility] ED after suffering a fall with resultant nondisplaced fracture through the Dens and nondisplaced fractures of the posterior arch of C1 bilaterally also noted, hard C collar placed and to be worn at all times per neurosurgeon. PMH of HTN.    PT Comments    Pt was supine in bed upon arriving with Hob elevated ~ 20 degrees. She agrees to PT session however states that pain has been severe today. She did not want to take strong pain medicine and has only had tylenol prior to session. Pain severely limited session progression. Severe neck and bilateral feet pain even at rest. Per RN. Pt reported she was comfortable last time she went in. Pt did agree to trial OOB activity however wasn't able. BP in supine 174/54 with HR 102 and sao2 98%. She required a lot more assistance to achieve EOB sitting today than she has been. Max assist required to achieve EOB sitting and Max assist to return to supine after EOB sitting. BP upon sitting up 164/65. She does endorse dizziness upon sitting up but resolved quickly. Pt did attempt to stand from elevated bed height however was unable due to bilateral foot pain. Pt will need rehab at DC. Pain most limiting factor today. Acute PT will continue to follow.     Follow Up Recommendations  SNF;Supervision for mobility/OOB     Equipment Recommendations  None recommended by PT    Recommendations for Other Services       Precautions / Restrictions Precautions Precautions: Fall Precaution Comments: dizziness Required Braces or Orthoses: Cervical Brace Cervical Brace: Hard collar;At all times Restrictions Weight Bearing Restrictions: No    Mobility  Bed Mobility Overal bed mobility: Needs Assistance Bed Mobility: Supine to Sit;Sit to Supine     Supine to  sit: Max assist Sit to supine: Max assist   General bed mobility comments: Pt required max assistance to exit R side of bed via log roll technique due to pain. Pt extremely limited today due to pain. did endorse pain and dizziness upon siting up EOB. BP at rest 174/54 upon sitting up 164/65 HR 103 bpm at rest that stayed <110 throughout. sao2 >96% throughotu on rm air.  Transfers Overall transfer level: Needs assistance        General transfer comment: Attempted to stand however upon attempt pt has svere pain in neck but mostly BLE(feet) and was unable to achieve full upright standing. Pt unwilling to trial a second attempt and required increased time + max assist to transition back to supine. severely limited by pain      Balance Overall balance assessment: Needs assistance Sitting-balance support: Feet supported Sitting balance-Leahy Scale: Good Sitting balance - Comments: no LOB sitting EOB         Cognition Arousal/Alertness: Awake/alert Behavior During Therapy: WFL for tasks assessed/performed Overall Cognitive Status: Within Functional Limits for tasks assessed        General Comments: Pt is A and O x 4 and agreeable to session however does endorse severe pain even at rest. RN notified of pain medicine request however pt has been resistive to taking anything stringer than tylonol             Pertinent Vitals/Pain Pain Assessment: 0-10 Pain Score: 7  (at rest. 9-10 with mobility) Pain Location: with  movement in neck, also pain BLE ankle/foot Pain Descriptors / Indicators: Guarding;Aching;Grimacing;Shooting;Sharp;Pounding;Crushing;Discomfort;Crying Pain Intervention(s): Limited activity within patient's tolerance;Monitored during session;Premedicated before session;Repositioned;RN gave pain meds during session           PT Goals (current goals can now be found in the care plan section) Acute Rehab PT Goals Patient Stated Goal: go to rehab to get better so I can go  home Progress towards PT goals: Progressing toward goals    Frequency    7X/week      PT Plan Discharge plan needs to be updated       AM-PAC PT "6 Clicks" Mobility   Outcome Measure  Help needed turning from your back to your side while in a flat bed without using bedrails?: A Lot Help needed moving from lying on your back to sitting on the side of a flat bed without using bedrails?: A Lot Help needed moving to and from a bed to a chair (including a wheelchair)?: A Lot Help needed standing up from a chair using your arms (e.g., wheelchair or bedside chair)?: A Lot Help needed to walk in hospital room?: A Lot Help needed climbing 3-5 steps with a railing? : A Lot 6 Click Score: 12    End of Session Equipment Utilized During Treatment: Gait belt Activity Tolerance: Patient limited by pain Patient left: in bed;with call bell/phone within reach;with bed alarm set Nurse Communication: Mobility status;Other (comment) (messaged MD about pain limiting lack of progress) PT Visit Diagnosis: Other abnormalities of gait and mobility (R26.89);Muscle weakness (generalized) (M62.81);Difficulty in walking, not elsewhere classified (R26.2);Pain     Time: 1335-1402 PT Time Calculation (min) (ACUTE ONLY): 27 min  Charges:  $Therapeutic Activity: 23-37 mins                     Julaine Fusi PTA 02/16/20, 3:29 PM

## 2020-02-16 NOTE — Progress Notes (Signed)
PROGRESS NOTE    Sherry [name]  XVQ:[id number] DOB: [birth year] DOA: 02/11/2020 PCP: Rusty Aus, MD   Chief Complain:Fall  Brief Narrative:  Sherry [name] is a 86 y.o. female with medical history significant for hyperlipidemia, statin intolerance, neuropathy, anxiety, hypertension presented to the hospital after a mechanical fall at home.  She complained of severe neck pain after the fall. She was found to have C1 cervical spine fracture.  She was treated with analgesics.  She was evaluated by the neurosurgeon, Dr. Lacinda Axon, who said the patient is not a good surgical candidate because of advanced age and therefore recommended conservative management.  A neck brace was recommended and this should be worn at all times per neurosurgeon. Hospital course remarkable for severe hypertension, dizziness.  PT/OT now recommending skilled nursing facility.  She is waiting for bed, medically stable for discharge.  Assessment & Plan  C1 cervical spine fracture: She has been evaluated by Dr. Lacinda Axon, neurosurgeon. Conservative management was recommended because patient is not a good surgical candidate. PT and OT evaluationdone.Recommended home health. She needs to follow-up with neurosurgery as an outpatientin3 weeks.  Patient does not want to take opiates for pain control and says it gives her nausea.  She prefers Tylenol   hypertensive urgency: Continue  losartan, added amlodipine.  Monitor blood pressure. We will continue to titrate the  medications as needed  Dizziness: Likely positional vertigo.  Continue meclizine as needed.MRI  ruled out cerebellar stroke. She was orthostatic on 02/14/20  but orthostatic vitals repeated on 02/15/20 were negative after she was given IV fluids  Left knee pain/left foot pain: X-rays showed degenerative/arthritic changes.  No fracture or dislocation.  Continue supportive care  Hypokalemia:Supplemented and corrected    Principal Problem:   Neck  fracture ([place]) Active Problems:   Hyperlipidemia   Neuropathy   Statin intolerance   Anxiety   Hypertensive urgency   Closed C1 fracture ([place])            DVT prophylaxis:Lovenox Code Status: Full Family Communication: Husband on phone on 02/14/2019 Status is: Inpatient  Remains inpatient appropriate because:Inpatient level of care appropriate due to severity of illness   Dispo: The patient is from: Home              Anticipated d/c is to: Skilled nursing facility              Anticipated d/c date is: 1 day              Patient currently is medically stable for discharge    Consultants: Neurosurgery  Procedures:None  Antimicrobials:  Anti-infectives (From admission, onward)   None      Subjective:  Patient seen and examined at the bedside this morning.  Comfortable and denies nausea or dizziness today.  Complains of bilateral lower extremity pain and numbness today.  Blood pressure slightly on the higher side  Objective: Vitals:   02/15/20 1525 02/15/20 1942 02/16/20 0000 02/16/20 0441  BP: (!) 163/82 (!) 162/58 (!) 147/57 (!) 163/54  Pulse: 100 91 92 94  Resp: 19 16 17 17   Temp: 98.4 F (36.9 C) 98.6 F (37 C) 98.9 F (37.2 C) 99.3 F (37.4 C)  TempSrc: Oral     SpO2: 99% 97% 95% 95%  Weight:      Height:        Intake/Output Summary (Last 24 hours) at 02/16/2020 0744 Last data filed at 02/16/2020 0500 Gross per 24 hour  Intake 480 ml  Output 650 ml  Net -170 ml   Filed Weights   02/11/20 1002  Weight: 63.5 kg    Examination:   General exam: Comfortable ,Not in distress,elderly female HEENT:cervical collar Respiratory system: Bilateral equal air entry, normal vesicular breath sounds, no wheezes or crackles  Cardiovascular system: S1 & S2 heard, RRR. No JVD, murmurs, rubs, gallops or clicks. Gastrointestinal system: Abdomen is nondistended, soft and nontender. No organomegaly or masses felt. Normal bowel sounds heard. Central nervous  system: Alert and oriented. No focal neurological deficits. Extremities: No edema, no clubbing ,no cyanosis Skin: No rashes, lesions or ulcers,no icterus ,no pallor   Data Reviewed: I have personally reviewed following labs and imaging studies  CBC: Recent Labs  Lab 02/11/20 1242 02/12/20 0343  WBC 7.7 7.7  NEUTROABS 6.0  --   HGB 13.4 12.3  HCT 39.8 37.3  MCV 87.9 88.4  PLT 217 601   Basic Metabolic Panel: Recent Labs  Lab 02/11/20 1242 02/12/20 0343 02/13/20 0529 02/15/20 0833  NA 142 138  --  138  K 3.7 3.2* 4.3 3.9  CL 104 102  --  99  CO2 27 26  --  27  GLUCOSE 103* 118*  --  115*  BUN 24* 17  --  24*  CREATININE 0.51 0.51  --  0.72  CALCIUM 9.7 9.0  --  9.3  MG  --  1.7  --   --    GFR: Estimated Creatinine Clearance: 42.8 mL/min (by C-G formula based on SCr of 0.72 mg/dL). Liver Function Tests: Recent Labs  Lab 02/11/20 1242 02/12/20 0343  AST 22 20  ALT 17 16  ALKPHOS 55 54  BILITOT 0.7 1.0  PROT 7.0 6.5  ALBUMIN 3.9 3.6   No results for input(s): LIPASE, AMYLASE in the last 168 hours. No results for input(s): AMMONIA in the last 168 hours. Coagulation Profile: No results for input(s): INR, PROTIME in the last 168 hours. Cardiac Enzymes: No results for input(s): CKTOTAL, CKMB, CKMBINDEX, TROPONINI in the last 168 hours. BNP (last 3 results) No results for input(s): PROBNP in the last 8760 hours. HbA1C: No results for input(s): HGBA1C in the last 72 hours. CBG: No results for input(s): GLUCAP in the last 168 hours. Lipid Profile: No results for input(s): CHOL, HDL, LDLCALC, TRIG, CHOLHDL, LDLDIRECT in the last 72 hours. Thyroid Function Tests: No results for input(s): TSH, T4TOTAL, FREET4, T3FREE, THYROIDAB in the last 72 hours. Anemia Panel: No results for input(s): VITAMINB12, FOLATE, FERRITIN, TIBC, IRON, RETICCTPCT in the last 72 hours. Sepsis Labs: No results for input(s): PROCALCITON, LATICACIDVEN in the last 168 hours.  Recent  Results (from the past 240 hour(s))  Resp Panel by RT-PCR (Flu A&B, Covid) Nasopharyngeal Swab     Status: None   Collection Time: 02/11/20  4:11 PM   Specimen: Nasopharyngeal Swab; Nasopharyngeal(NP) swabs in vial transport medium  Result Value Ref Range Status   SARS Coronavirus 2 by RT PCR NEGATIVE NEGATIVE Final    Comment: (NOTE) SARS-CoV-2 target nucleic acids are NOT DETECTED.  The SARS-CoV-2 RNA is generally detectable in upper respiratory specimens during the acute phase of infection. The lowest concentration of SARS-CoV-2 viral copies this assay can detect is 138 copies/mL. A negative result does not preclude SARS-Cov-2 infection and should not be used as the sole basis for treatment or other patient management decisions. A negative result may occur with  improper specimen collection/handling, submission of specimen other than nasopharyngeal swab, presence of viral mutation(s) within  the areas targeted by this assay, and inadequate number of viral copies(<138 copies/mL). A negative result must be combined with clinical observations, patient history, and epidemiological information. The expected result is Negative.  Fact Sheet for Patients:  EntrepreneurPulse.com.au  Fact Sheet for Healthcare Providers:  IncredibleEmployment.be  This test is no t yet approved or cleared by the [place] FDA and  has been authorized for detection and/or diagnosis of SARS-CoV-2 by FDA under an Emergency Use Authorization (EUA). This EUA will remain  in effect (meaning this test can be used) for the duration of the COVID-19 declaration under Section 564(b)(1) of the Act, 21 U.S.C.section 360bbb-3(b)(1), unless the authorization is terminated  or revoked sooner.       Influenza A by PCR NEGATIVE NEGATIVE Final   Influenza B by PCR NEGATIVE NEGATIVE Final    Comment: (NOTE) The Xpert Xpress SARS-CoV-2/FLU/RSV plus assay is intended as an aid in the  diagnosis of influenza from Nasopharyngeal swab specimens and should not be used as a sole basis for treatment. Nasal washings and aspirates are unacceptable for Xpert Xpress SARS-CoV-2/FLU/RSV testing.  Fact Sheet for Patients: EntrepreneurPulse.com.au  Fact Sheet for Healthcare Providers: IncredibleEmployment.be  This test is not yet approved or cleared by the [place] FDA and has been authorized for detection and/or diagnosis of SARS-CoV-2 by FDA under an Emergency Use Authorization (EUA). This EUA will remain in effect (meaning this test can be used) for the duration of the COVID-19 declaration under Section 564(b)(1) of the Act, 21 U.S.C. section 360bbb-3(b)(1), unless the authorization is terminated or revoked.  Performed at [facility], [address], [place], [place] [address]          Radiology Studies: DG Knee 1-2 Views Left  Result Date: 02/15/2020 CLINICAL DATA:  Pain following recent fall EXAM: LEFT KNEE - 1-2 VIEW COMPARISON:  None. FINDINGS: Frontal and lateral views were obtained. There is no fracture or dislocation. There is a small joint effusion. There is moderate generalized joint space narrowing with spurring in all compartments. There is chondrocalcinosis. No erosion. IMPRESSION: No fracture or dislocation. Small joint effusion. Generalized osteoarthritic change. There is chondrocalcinosis which may be seen with osteoarthritis or calcium pyrophosphate deposition disease. Electronically Signed   By: Lowella Grip III M.D.   On: 02/15/2020 10:07   MR BRAIN WO CONTRAST  Result Date: 02/15/2020 CLINICAL DATA:  Dizziness, nonspecific. EXAM: MRI HEAD WITHOUT CONTRAST TECHNIQUE: Multiplanar, multiecho pulse sequences of the brain and surrounding structures were obtained without intravenous contrast. COMPARISON:  Brain MRI 02/11/2020. FINDINGS: Brain: Mild intermittent motion degradation. Mild cerebral and  cerebellar atrophy. Small focus of chronic cortical encephalomalacia within the anterior right frontal lobe. Chronic lacunar infarcts within the basal ganglia and left thalamus. Mild-to-moderate multifocal T2/FLAIR hyperintensity within the cerebral white matter is nonspecific, but compatible with chronic small vessel ischemic disease. Small chronic infarct within the left cerebellar hemisphere. There is no acute infarct. No evidence of intracranial mass. No chronic intracranial blood products. No extra-axial fluid collection. No midline shift. Vascular: Expected proximal arterial flow voids. Skull and upper cervical spine: No focal marrow lesion. C4-C5 grade 1 anterolisthesis. Sinuses/Orbits: Visualized orbits show no acute finding. Frothy secretions within a posterior right ethmoid air cell. Other: Small right mastoid effusion IMPRESSION: 1. No evidence of acute intracranial abnormality, including acute infarction. 2. Stable appearance of the brain as compared to the recent prior MRI of 02/11/2020. 3. Small focus of chronic encephalomalacia within the anterior right frontal lobe. 4. Chronic lacunar  infarcts within the basal ganglia and left thalamus. 5. Background mild parenchymal atrophy and mild-to-moderate cerebral white matter chronic small vessel ischemic disease. 6. 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 7. Right ethmoid sinusitis. 8. Small right mastoid effusion. Electronically Signed   By: Kellie Simmering DO   On: 02/15/2020 13:06   DG Foot 2 Views Left  Result Date: 02/15/2020 CLINICAL DATA:  Pain following fall EXAM: LEFT FOOT - 2 VIEW COMPARISON:  None. FINDINGS: Frontal and lateral views were obtained. No fracture or dislocation. There is spurring in the dorsal midfoot. No appreciable joint space narrowing. There is a small inferior calcaneal spur. No erosion. IMPRESSION: Spurring dorsal midfoot. Small inferior calcaneal spur. No fracture or dislocation. Electronically Signed   By: Lowella Grip III M.D.   On: 02/15/2020 10:08        Scheduled Meds: . amLODipine  10 mg Oral Daily  . enoxaparin (LOVENOX) injection  40 mg Subcutaneous Q24H  . losartan  50 mg Oral Daily  . polyethylene glycol  17 g Oral Daily  . promethazine  12.5 mg Intravenous Once   Continuous Infusions:   LOS: 4 days    Time spent: 25 mins,More than 50% of that time was spent in counseling and/or coordination of care.      Shelly Coss, MD [facility] P12/18/2021, 7:44 AM

## 2020-02-16 NOTE — TOC Progression Note (Signed)
Transition of Care [facility]) - Progression Note    Patient Details  Name: Sherry [name] MRN: [id number] Date of Birth: 1930/11/22  Transition of [facility]) CM/SW Contact  [name], RN Phone Number: 02/16/2020, 5:24 PM  Clinical Narrative:   Waiting on ins authorization for SNF placement via [employer]. None in system today. [name] RN CM    Expected Discharge Plan: Home/Self Care Barriers to Discharge: Continued Medical Work up  Expected Discharge Plan and Services Expected Discharge Plan: Home/Self Care       Living arrangements for the past 2 months: Single Family Home Expected Discharge Date: 02/14/20                                     Social Determinants of Health (SDOH) Interventions    Readmission Risk Interventions No flowsheet data found.

## 2020-02-17 ENCOUNTER — Other Ambulatory Visit: Payer: Self-pay

## 2020-02-17 LAB — RESP PANEL BY RT-PCR (FLU A&B, COVID) ARPGX2
Influenza A by PCR: NEGATIVE
Influenza B by PCR: NEGATIVE
SARS Coronavirus 2 by RT PCR: NEGATIVE

## 2020-02-17 MED ORDER — MECLIZINE HCL 25 MG PO TABS: 25.0000 mg | ORAL_TABLET | Freq: Four times a day (QID) | ORAL | 0 refills | Status: DC | PRN

## 2020-02-17 MED ORDER — POLYETHYLENE GLYCOL 3350 17 G PO PACK: 17.0000 g | PACK | Freq: Every day | ORAL | 0 refills | Status: DC

## 2020-02-17 MED ORDER — LOSARTAN POTASSIUM 50 MG PO TABS: 100.0000 mg | ORAL_TABLET | Freq: Every day | ORAL | Status: DC

## 2020-02-17 NOTE — TOC Transition Note (Addendum)
Transition of Care [facility]) - CM/SW Discharge Note   Patient Details  Name: Sherry [name] MRN: [id number] Date of Birth: Aug 20, 1930  Transition of Care [facility]) CM/SW Contact:  Sherry Price, RN Phone Number: 02/17/2020, 11:33 AM   Clinical Narrative:    Pt to be discharged and transferred to [facility]. Contacted Sherry [name] [facility] to confirm room #113. Gave Unit RN contact information for hand off to [place] at [phone] or [phone]. Had left VM with contact information and son did return call and aware of transfer today via provider. Will notified transport when ready. Sherry Severin RN CM   1350 Covid test taken today reported negative. Sherry Usher Hedberg Rn CM. Will call non emergency transport. Sherry Davies RN CM     Final next level of care: [place] [name].) Barriers to Discharge: Barriers Resolved   Patient Goals and CMS Choice Patient states their goals for this hospitalization and ongoing recovery are:: home with self care CMS Medicare.gov Compare Post Acute Care list provided to:: Patient Choice offered to / list presented to : Patient  Discharge Placement              Patient chooses bed at: [facility] Patient to be transferred to facility by: [facility] Name of family member notified: Sherry [name]. (left VM) (Called room, spouses number and son's contact info. Left VM with spouse and son's with call back number. Notified unit RN of attempting to reach family and VM left. Sherry Davies RN CM) Patient and family notified of of transfer: 02/17/20 (Called room iwith no answer. Left VM with spouse and son contact number. Notified unit RN.)  Discharge Plan and Services                DME Arranged: N/A DME Agency: NA                  Social Determinants of Health (SDOH) Interventions     Readmission Risk Interventions No flowsheet data found.

## 2020-02-17 NOTE — Progress Notes (Signed)
Immobilization of Fx is to wear hard cervical collar at all times.

## 2020-02-17 NOTE — TOC Transition Note (Signed)
Transition of Care [facility]) - CM/SW Discharge Note   Patient Details  Name: Sherry [name] MRN: [id number] Date of Birth: 03/13/1930  Transition of Care [facility]) CM/SW Contact:  [name], RN Phone Number: 02/17/2020, 2:46 PM   Clinical Narrative:   [phone] 2/19 Patient being discharge to [facility] Confirmed with facility. Report called by Unit RN. Covid test negative prior to discharge. Son called and notified of transfer to [facility]. Facesheet and [employer]. Forms printed to unit and unit RN received. Transport by [employer]. Pt. With hard C collar to remain on at all times. Put in transport notes. [name] RN CM   Final next level of care: [place] ([facility] [name].) Barriers to Discharge: Barriers Resolved   Patient Goals and CMS Choice Patient states their goals for this hospitalization and ongoing recovery are:: home with self care CMS Medicare.gov Compare Post Acute Care list provided to:: Patient Choice offered to / list presented to : Patient  Discharge Placement              Patient chooses bed at: [facility] Patient to be transferred to facility by: [facility] Name of family member notified: [name]. (left VM) (Called room, spouses number and son's contact info. Left VM with spouse and son's with call back number. Notified unit RN of attempting to reach family and VM left. [name] RN CM) Patient and family notified of of transfer: 02/17/20 (Called room iwith no answer. Left VM with spouse and son contact number. Notified unit RN.)  Discharge Plan and Services                DME Arranged: N/A DME Agency: NA                  Social Determinants of Health (SDOH) Interventions     Readmission Risk Interventions No flowsheet data found.

## 2020-02-17 NOTE — Progress Notes (Signed)
Discharge note:  Patient discharged to [facility] today. Report called to [place]. IV removed. AVS placed in packet. Transported by EMS.  Ronnette Hila, RN

## 2020-02-17 NOTE — Progress Notes (Signed)
Physical Therapy Treatment Patient Details Name: Sherry [name] MRN: [id number] DOB: [birth year] Today's Date: 02/17/2020    History of Present Illness Pt is a 86 y.o. female who presents to the [facility] ED after suffering a fall with resultant nondisplaced fracture through the Dens and nondisplaced fractures of the posterior arch of C1 bilaterally also noted, hard C collar placed and to be worn at all times per neurosurgeon. PMH of HTN.    PT Comments    Pt ready for session.  No pain at rest but increases with movement.  Participated in exercises as described below.  Rolling with min assist then transitions with mod a x 1 to sitting.  Stood with min a x 1 and transferred to commode to void and small BM.  Care provided and she elected to try to sit in the chair and transferred with min a x 1.  Unable to remains standing longer than for transfer.  Positioned for comfort.  She was only able to remain sitting for about 10 minutes before calling nursing to assist her back to bed due to pain.  She remains motivated to improve but overestimates her abilities asking to walk to the bathroom instead of commode.  Encouraged to try commode first and she was then aware of her limitations.  "I want to do everything I can to get better."  Pain L foot > RLE.   Follow Up Recommendations  SNF;Supervision for mobility/OOB     Equipment Recommendations  None recommended by PT    Recommendations for Other Services       Precautions / Restrictions Precautions Precautions: Fall Precaution Comments: dizziness Required Braces or Orthoses: Cervical Brace Cervical Brace: Hard collar;At all times Restrictions Weight Bearing Restrictions: No    Mobility  Bed Mobility Overal bed mobility: Needs Assistance Bed Mobility: Sidelying to Sit;Rolling Rolling: Min assist Sidelying to sit: Mod assist       General bed mobility comments: good effort with less assist today.  Transfers Overall transfer level:  Needs assistance Equipment used: Rolling walker (2 wheeled) Transfers: Sit to/from Stand Sit to Stand: Min assist            Ambulation/Gait Ambulation/Gait assistance: Herbalist (Feet): 3 Feet Assistive device: Rolling walker (2 wheeled) Gait Pattern/deviations: Step-to pattern Gait velocity: decreased       Stairs             Wheelchair Mobility    Modified Rankin (Stroke Patients Only)       Balance Overall balance assessment: Needs assistance Sitting-balance support: Feet supported Sitting balance-Leahy Scale: Good     Standing balance support: Bilateral upper extremity supported Standing balance-Leahy Scale: Fair Standing balance comment: reliant on UE support                            Cognition Arousal/Alertness: Awake/alert Behavior During Therapy: WFL for tasks assessed/performed Overall Cognitive Status: Within Functional Limits for tasks assessed                                        Exercises Other Exercises Other Exercises: supine ankle pumps, quad and glut sets, heel slides and ab/add with AAROM as needed x 10 Other Exercises: to commode to void and small BM    General Comments        Pertinent Vitals/Pain Pain Assessment: Faces  Faces Pain Scale: Hurts even more Pain Location: with movement in neck, also pain BLE ankle/foot Pain Descriptors / Indicators: Guarding;Aching;Grimacing;Discomfort Pain Intervention(s): Limited activity within patient's tolerance;Monitored during session;Repositioned    Home Living                      Prior Function            PT Goals (current goals can now be found in the care plan section) Progress towards PT goals: Progressing toward goals    Frequency    7X/week      PT Plan Current plan remains appropriate    Co-evaluation              AM-PAC PT "6 Clicks" Mobility   Outcome Measure  Help needed turning from your back to  your side while in a flat bed without using bedrails?: A Lot Help needed moving from lying on your back to sitting on the side of a flat bed without using bedrails?: A Lot Help needed moving to and from a bed to a chair (including a wheelchair)?: A Lot Help needed standing up from a chair using your arms (e.g., wheelchair or bedside chair)?: A Lot Help needed to walk in hospital room?: A Lot Help needed climbing 3-5 steps with a railing? : Total 6 Click Score: 11    End of Session Equipment Utilized During Treatment: Gait belt Activity Tolerance: Patient limited by pain Patient left: in chair;with call bell/phone within reach;with chair alarm set Nurse Communication: Mobility status;Other (comment)       Time: 7494-4967 PT Time Calculation (min) (ACUTE ONLY): 23 min  Charges:  $Therapeutic Exercise: 8-22 mins $Therapeutic Activity: 8-22 mins                    Chesley Noon, PTA 02/17/20, 10:56 AM

## 2020-02-17 NOTE — Discharge Summary (Addendum)
Physician Discharge Summary  Sherry [name] PJA:[id number] DOB: 03-12-1930 DOA: 02/11/2020  PCP: Rusty Aus, MD  Admit date: 02/11/2020 Discharge date: 02/17/2020  Admitted From: Home Disposition:  SNF  Discharge Condition:Stable CODE STATUS:FULL Diet recommendation: Heart Healthy    Brief/Interim Summary:  Sherry H Johnsonis a 86 y.o.femalewith medical history significant for hyperlipidemia, statin intolerance, neuropathy, anxiety, hypertension presented to the hospital after a mechanical fall at home. She complained of severe neck pain after the fall. She was found to have C1 cervical spine fracture.  She was evaluated by the neurosurgeon, Dr. Lacinda Axon, who said the patient is not a good surgical candidate because of advanced age and therefore recommended conservative management. A neck brace was recommended and this should be worn at all times per neurosurgery. Hospital course remarkable for severe hypertension.  Blood pressure is acceptable.  Neck pain is well controlled.  She was seen by PT/OT who recommended discussing facility on discharge.  She is medically stable for discharge to skilled nursing facility today.  Following problems were addressed during her hospitalization:   C1 cervical spine fracture: She has been evaluated by Dr. Lacinda Axon, neurosurgeon. Conservative management was recommended because patient is not a good surgical candidate. PT and OT evaluationdone.Recommended SNF. She needs to follow-up with neurosurgery as an outpatientin3 weeks.  Patient does not want to take opiates for pain control and says it gives her nausea.  She prefers Tylenol   hypertensive urgency: Continue  losartan 100 mg and  Amlodipine 10 mg daily.  Dizziness: Likely positional vertigo.  Continue meclizine as needed.MRI  ruled out cerebellar stroke. She was orthostatic on 02/14/20  but orthostatic vitals repeated on 02/15/20 were negative after she was given IV  fluids  Left knee pain/left foot pain: X-rays showed degenerative/arthritic changes.  No fracture or dislocation.  Continue supportive care  Hypokalemia:Supplemented and corrected   Discharge Diagnoses:  Principal Problem:   Neck fracture ([place]) Active Problems:   Hyperlipidemia   Neuropathy   Statin intolerance   Anxiety   Hypertensive urgency   Closed C1 fracture [facility])    Discharge Instructions  Discharge Instructions    Diet - low sodium heart healthy   Complete by: As directed    Discharge instructions   Complete by: As directed    1)Please take prescribed medication as instructed.  Monitor blood pressure . 2)Continue to apply the cervical collar 3)Follow up with neurosurgery in 3 weeks.  Name and number of the provider has been attached.   Increase activity slowly   Complete by: As directed      Allergies as of 02/17/2020      Reactions   Codeine    Morphine And Related Other (See Comments)   Respiratory Distress   Percocet [oxycodone-acetaminophen]       Medication List    STOP taking these medications   hydrochlorothiazide 25 MG tablet Commonly known as: HYDRODIURIL     TAKE these medications   acetaminophen 500 MG tablet Commonly known as: TYLENOL Take 500-1,000 mg by mouth every 6 (six) hours as needed for mild pain or fever.   amLODipine 10 MG tablet Commonly known as: NORVASC Take 1 tablet (10 mg total) by mouth daily.   B-12 1000 MCG Caps Take 1,000 mcg by mouth daily.   Biotin 5 MG Tabs Take 5 mg by mouth daily.   celecoxib 200 MG capsule Commonly known as: CELEBREX Take 200 mg by mouth daily.   cholecalciferol 25 MCG (1000 UNIT) tablet Commonly known  as: VITAMIN D Take 2,000 Units by mouth daily.   losartan 100 MG tablet Commonly known as: COZAAR Take 1 tablet (100 mg total) by mouth daily. What changed:   medication strength  how much to take  when to take this   meclizine 25 MG tablet Commonly known as:  ANTIVERT Take 1 tablet (25 mg total) by mouth every 6 (six) hours as needed for dizziness.   polyethylene glycol 17 g packet Commonly known as: MIRALAX / GLYCOLAX Take 17 g by mouth daily. Start taking on: February 18, 2020       Follow-up Information    Rusty Aus, MD. Go on 02/21/2020.   Specialty: Internal Medicine Why: @ 3:45 pm Contact information: [address] [place] [address] [phone]        Deetta Perla, MD. Go on 03/04/2020.   Specialty: Neurosurgery Why: Appt w/ Lonell Face, PA-C;  @ 4:45 pm Contact information: [place] [address] [phone]              Allergies  Allergen Reactions  . Codeine   . Morphine And Related Other (See Comments)    Respiratory Distress   . Percocet [Oxycodone-Acetaminophen]     Consultations:  Neurosurgery   Procedures/Studies: DG Knee 1-2 Views Left  Result Date: 02/15/2020 CLINICAL DATA:  Pain following recent fall EXAM: LEFT KNEE - 1-2 VIEW COMPARISON:  None. FINDINGS: Frontal and lateral views were obtained. There is no fracture or dislocation. There is a small joint effusion. There is moderate generalized joint space narrowing with spurring in all compartments. There is chondrocalcinosis. No erosion. IMPRESSION: No fracture or dislocation. Small joint effusion. Generalized osteoarthritic change. There is chondrocalcinosis which may be seen with osteoarthritis or calcium pyrophosphate deposition disease. Electronically Signed   By: Lowella Grip III M.D.   On: 02/15/2020 10:07   CT Head Wo Contrast  Addendum Date: 02/11/2020   ADDENDUM REPORT: 02/11/2020 16:02 ADDENDUM: After further review, there is a nondisplaced fracture through the dens. Nondisplaced fractures of the posterior arch of C1 bilaterally also noted. These results were called by telephone at the time of interpretation on 02/11/2020 at 4:01 pm to provider Quentin Cornwall, who  verbally acknowledged these results. Electronically Signed   By: Franchot Gallo M.D.   On: 02/11/2020 16:02   Result Date: 02/11/2020 CLINICAL DATA:  Fall at home.  Head injury. EXAM: CT HEAD WITHOUT CONTRAST CT CERVICAL SPINE WITHOUT CONTRAST TECHNIQUE: Multidetector CT imaging of the head and cervical spine was performed following the standard protocol without intravenous contrast. Multiplanar CT image reconstructions of the cervical spine were also generated. COMPARISON:  CT head 07/06/2019 FINDINGS: CT HEAD FINDINGS Brain: Generalized atrophy unchanged. Negative for hydrocephalus. Chronic microvascular ischemic changes in the white matter unchanged. Chronic lacunar infarction head of caudate on the left unchanged. Negative for acute infarct, hemorrhage, mass. Vascular: Negative for hyperdense vessel Skull: Nondisplaced left occipital bone fracture unchanged from the prior CT. No acute fracture. Sinuses/Orbits: Paranasal sinuses clear. Bilateral cataract extraction. Other: None CT CERVICAL SPINE FINDINGS Alignment: Mild anterolisthesis C2-3 and C3-4. 3 mm anterolisthesis C4-5. Mild anterolisthesis C7-T1 and T1-2. Skull base and vertebrae: Negative for fracture Soft tissues and spinal canal: Negative Disc levels: Multilevel disc and facet degeneration. Prominent degenerative change and spurring throughout the cervical spine. Multilevel foraminal stenosis due to spurring. Upper chest: Lung apices clear bilaterally. Other: None IMPRESSION: 1. No acute intracranial abnormality. Atrophy and chronic microvascular ischemic change stable  2. Negative for cervical spine fracture. Prominent cervical spondylosis 3. Chronic fracture left occipital bone unchanged. Electronically Signed: By: Franchot Gallo M.D. On: 02/11/2020 10:42   CT Cervical Spine Wo Contrast  Addendum Date: 02/11/2020   ADDENDUM REPORT: 02/11/2020 16:02 ADDENDUM: After further review, there is a nondisplaced fracture through the dens. Nondisplaced  fractures of the posterior arch of C1 bilaterally also noted. These results were called by telephone at the time of interpretation on 02/11/2020 at 4:01 pm to provider Quentin Cornwall, who verbally acknowledged these results. Electronically Signed   By: Franchot Gallo M.D.   On: 02/11/2020 16:02   Result Date: 02/11/2020 CLINICAL DATA:  Fall at home.  Head injury. EXAM: CT HEAD WITHOUT CONTRAST CT CERVICAL SPINE WITHOUT CONTRAST TECHNIQUE: Multidetector CT imaging of the head and cervical spine was performed following the standard protocol without intravenous contrast. Multiplanar CT image reconstructions of the cervical spine were also generated. COMPARISON:  CT head 07/06/2019 FINDINGS: CT HEAD FINDINGS Brain: Generalized atrophy unchanged. Negative for hydrocephalus. Chronic microvascular ischemic changes in the white matter unchanged. Chronic lacunar infarction head of caudate on the left unchanged. Negative for acute infarct, hemorrhage, mass. Vascular: Negative for hyperdense vessel Skull: Nondisplaced left occipital bone fracture unchanged from the prior CT. No acute fracture. Sinuses/Orbits: Paranasal sinuses clear. Bilateral cataract extraction. Other: None CT CERVICAL SPINE FINDINGS Alignment: Mild anterolisthesis C2-3 and C3-4. 3 mm anterolisthesis C4-5. Mild anterolisthesis C7-T1 and T1-2. Skull base and vertebrae: Negative for fracture Soft tissues and spinal canal: Negative Disc levels: Multilevel disc and facet degeneration. Prominent degenerative change and spurring throughout the cervical spine. Multilevel foraminal stenosis due to spurring. Upper chest: Lung apices clear bilaterally. Other: None IMPRESSION: 1. No acute intracranial abnormality. Atrophy and chronic microvascular ischemic change stable 2. Negative for cervical spine fracture. Prominent cervical spondylosis 3. Chronic fracture left occipital bone unchanged. Electronically Signed: By: Franchot Gallo M.D. On: 02/11/2020 10:42   MR BRAIN  WO CONTRAST  Result Date: 02/15/2020 CLINICAL DATA:  Dizziness, nonspecific. EXAM: MRI HEAD WITHOUT CONTRAST TECHNIQUE: Multiplanar, multiecho pulse sequences of the brain and surrounding structures were obtained without intravenous contrast. COMPARISON:  Brain MRI 02/11/2020. FINDINGS: Brain: Mild intermittent motion degradation. Mild cerebral and cerebellar atrophy. Small focus of chronic cortical encephalomalacia within the anterior right frontal lobe. Chronic lacunar infarcts within the basal ganglia and left thalamus. Mild-to-moderate multifocal T2/FLAIR hyperintensity within the cerebral white matter is nonspecific, but compatible with chronic small vessel ischemic disease. Small chronic infarct within the left cerebellar hemisphere. There is no acute infarct. No evidence of intracranial mass. No chronic intracranial blood products. No extra-axial fluid collection. No midline shift. Vascular: Expected proximal arterial flow voids. Skull and upper cervical spine: No focal marrow lesion. C4-C5 grade 1 anterolisthesis. Sinuses/Orbits: Visualized orbits show no acute finding. Frothy secretions within a posterior right ethmoid air cell. Other: Small right mastoid effusion IMPRESSION: 1. No evidence of acute intracranial abnormality, including acute infarction. 2. Stable appearance of the brain as compared to the recent prior MRI of 02/11/2020. 3. Small focus of chronic encephalomalacia within the anterior right frontal lobe. 4. Chronic lacunar infarcts within the basal ganglia and left thalamus. 5. Background mild parenchymal atrophy and mild-to-moderate cerebral white matter chronic small vessel ischemic disease. 6. 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 7. Right ethmoid sinusitis. 8. Small right mastoid effusion. Electronically Signed   By: Kellie Simmering DO   On: 02/15/2020 13:06   MR Brain W and Wo Contrast  Result Date: 02/11/2020  CLINICAL DATA:  Fall, dizziness EXAM: MRI HEAD WITHOUT AND WITH  CONTRAST TECHNIQUE: Multiplanar, multiecho pulse sequences of the brain and surrounding structures were obtained without and with intravenous contrast. CONTRAST:  19mL GADAVIST GADOBUTROL 1 MMOL/ML IV SOLN COMPARISON:  None. FINDINGS: Brain: There is no acute infarction or intracranial hemorrhage. There is no intracranial mass, mass effect, or edema. There is no hydrocephalus or extra-axial fluid collection. Prominence of the ventricles and sulci reflects mild generalized parenchymal volume loss. There are small chronic infarcts of the basal ganglia bilaterally and left cerebellum. Anterior right frontal encephalomalacia. Additional patchy and confluent areas of T2 hyperintensity in the supratentorial white matter are nonspecific but probably reflect mild to moderate chronic microvascular ischemic changes. No abnormal enhancement. Vascular: Major vessel flow voids at the skull base are preserved. Skull and upper cervical spine: Normal marrow signal is preserved. Sinuses/Orbits: Minor mucosal thickening. Bilateral lens replacements. Other: Sella is unremarkable.  Mastoid air cells are clear. IMPRESSION: No evidence of acute infarction, hemorrhage, or mass. Chronic infarcts and chronic microvascular ischemic changes. Electronically Signed   By: Macy Mis M.D.   On: 02/11/2020 16:06   MR Cervical Spine Wo Contrast  Result Date: 02/11/2020 CLINICAL DATA:  Fall, negative CT EXAM: MRI CERVICAL SPINE WITHOUT CONTRAST TECHNIQUE: Multiplanar, multisequence MR imaging of the cervical spine was performed. No intravenous contrast was administered. COMPARISON:  None. FINDINGS: Alignment: Mild multilevel degenerative listhesis including anterolisthesis at C2-C3, C3-C4, C6-C7, and C7-T1. Vertebrae: There is a STIR hyperintense fracture cleft through the dens. Mild posterior angulation of the dens. Fluid signal is present at the atlantodental articulation on the sagittal view. This is not covered on axial imaging. There  appears to be STIR hyperintensity along the fracture lines of the C1 posterior posterior arch. Cord: No abnormal signal. Posterior Fossa, vertebral arteries, paraspinal tissues: Mild prevertebral soft tissue swelling at C1 and C2 levels. Disc levels: C2-C3: Minimal disc bulge with small endplate osteophytes. Facet hypertrophy. No canal stenosis. Minor foraminal stenosis. C3-C4: Disc bulge with endplate osteophytes and facet and uncovertebral hypertrophy. No canal stenosis. Moderate to marked foraminal stenosis. C4-C5: Disc bulge with endplate osteophytes and left greater than right uncovertebral and facet hypertrophy. No canal or right foraminal stenosis. Moderate to marked left foraminal stenosis. C5-C6: Disc osteophyte complex with uncovertebral and left greater than right facet hypertrophy. Mild canal stenosis. Moderate to marked right and marked left foraminal stenosis. C6-C7: Disc osteophyte complex with uncovertebral greater than facet hypertrophy. Mild canal stenosis. Moderate to marked foraminal stenosis. C7-T1:  Facet hypertrophy.  No canal or foraminal stenosis. IMPRESSION: Acute type 2 dens fracture with mild posterior angulation. Fluid is present at the atlantodental articulation, which is only covered on sagittal imaging. Suspected STIR hyperintensity along bilateral nondisplaced C1 posterior arch fracture clefts suggesting acuity. Multilevel degenerative changes. No high-grade canal stenosis. Multilevel foraminal narrowing. These results were called by telephone at the time of interpretation on 02/11/2020 at 3:45 pm to provider Ashok Cordia , who verbally acknowledged these results. Electronically Signed   By: Macy Mis M.D.   On: 02/11/2020 15:57   DG Foot 2 Views Left  Result Date: 02/15/2020 CLINICAL DATA:  Pain following fall EXAM: LEFT FOOT - 2 VIEW COMPARISON:  None. FINDINGS: Frontal and lateral views were obtained. No fracture or dislocation. There is spurring in the dorsal midfoot.  No appreciable joint space narrowing. There is a small inferior calcaneal spur. No erosion. IMPRESSION: Spurring dorsal midfoot. Small inferior calcaneal spur. No fracture or dislocation. Electronically  Signed   By: Lowella Grip III M.D.   On: 02/15/2020 10:08       Subjective: Patient seen and examined at the bedside this morning.  Hemodynamically stable for discharge.  Discharge Exam: Vitals:   02/17/20 0458 02/17/20 0800  BP: (!) 169/57 (!) 184/59  Pulse: 87 88  Resp: 16 18  Temp: 98.6 F (37 C) 98.4 F (36.9 C)  SpO2: 97% 99%   Vitals:   02/16/20 1944 02/16/20 2344 02/17/20 0458 02/17/20 0800  BP: (!) 140/46 (!) 149/49 (!) 169/57 (!) 184/59  Pulse: 89 85 87 88  Resp: 18 16 16 18   Temp: 99.4 F (37.4 C) 98.5 F (36.9 C) 98.6 F (37 C) 98.4 F (36.9 C)  TempSrc:      SpO2: 93% 93% 97% 99%  Weight:      Height:        General: Pt is alert, awake, not in acute distress,cervical collar Cardiovascular: RRR, S1/S2 +, no rubs, no gallops Respiratory: CTA bilaterally, no wheezing, no rhonchi Abdominal: Soft, NT, ND, bowel sounds + Extremities: no edema, no cyanosis    The results of significant diagnostics from this hospitalization (including imaging, microbiology, ancillary and laboratory) are listed below for reference.     Microbiology: Recent Results (from the past 240 hour(s))  Resp Panel by RT-PCR (Flu A&B, Covid) Nasopharyngeal Swab     Status: None   Collection Time: 02/11/20  4:11 PM   Specimen: Nasopharyngeal Swab; Nasopharyngeal(NP) swabs in vial transport medium  Result Value Ref Range Status   SARS Coronavirus 2 by RT PCR NEGATIVE NEGATIVE Final    Comment: (NOTE) SARS-CoV-2 target nucleic acids are NOT DETECTED.  The SARS-CoV-2 RNA is generally detectable in upper respiratory specimens during the acute phase of infection. The lowest concentration of SARS-CoV-2 viral copies this assay can detect is 138 copies/mL. A negative result does not  preclude SARS-Cov-2 infection and should not be used as the sole basis for treatment or other patient management decisions. A negative result may occur with  improper specimen collection/handling, submission of specimen other than nasopharyngeal swab, presence of viral mutation(s) within the areas targeted by this assay, and inadequate number of viral copies(<138 copies/mL). A negative result must be combined with clinical observations, patient history, and epidemiological information. The expected result is Negative.  Fact Sheet for Patients:  EntrepreneurPulse.com.au  Fact Sheet for Healthcare Providers:  IncredibleEmployment.be  This test is no t yet approved or cleared by the [place] FDA and  has been authorized for detection and/or diagnosis of SARS-CoV-2 by FDA under an Emergency Use Authorization (EUA). This EUA will remain  in effect (meaning this test can be used) for the duration of the COVID-19 declaration under Section 564(b)(1) of the Act, 21 U.S.C.section 360bbb-3(b)(1), unless the authorization is terminated  or revoked sooner.       Influenza A by PCR NEGATIVE NEGATIVE Final   Influenza B by PCR NEGATIVE NEGATIVE Final    Comment: (NOTE) The Xpert Xpress SARS-CoV-2/FLU/RSV plus assay is intended as an aid in the diagnosis of influenza from Nasopharyngeal swab specimens and should not be used as a sole basis for treatment. Nasal washings and aspirates are unacceptable for Xpert Xpress SARS-CoV-2/FLU/RSV testing.  Fact Sheet for Patients: EntrepreneurPulse.com.au  Fact Sheet for Healthcare Providers: IncredibleEmployment.be  This test is not yet approved or cleared by the [place] FDA and has been authorized for detection and/or diagnosis of SARS-CoV-2 by FDA under an Emergency Use Authorization (EUA). This  EUA will remain in effect (meaning this test can be used) for the  duration of the COVID-19 declaration under Section 564(b)(1) of the Act, 21 U.S.C. section 360bbb-3(b)(1), unless the authorization is terminated or revoked.  Performed at [facility], [address] [place], [place] [address]      Labs: BNP (last 3 results) No results for input(s): BNP in the last 8760 hours. Basic Metabolic Panel: Recent Labs  Lab 02/11/20 1242 02/12/20 0343 02/13/20 0529 02/15/20 0833  NA 142 138  --  138  K 3.7 3.2* 4.3 3.9  CL 104 102  --  99  CO2 27 26  --  27  GLUCOSE 103* 118*  --  115*  BUN 24* 17  --  24*  CREATININE 0.51 0.51  --  0.72  CALCIUM 9.7 9.0  --  9.3  MG  --  1.7  --   --    Liver Function Tests: Recent Labs  Lab 02/11/20 1242 02/12/20 0343  AST 22 20  ALT 17 16  ALKPHOS 55 54  BILITOT 0.7 1.0  PROT 7.0 6.5  ALBUMIN 3.9 3.6   No results for input(s): LIPASE, AMYLASE in the last 168 hours. No results for input(s): AMMONIA in the last 168 hours. CBC: Recent Labs  Lab 02/11/20 1242 02/12/20 0343  WBC 7.7 7.7  NEUTROABS 6.0  --   HGB 13.4 12.3  HCT 39.8 37.3  MCV 87.9 88.4  PLT 217 221   Cardiac Enzymes: No results for input(s): CKTOTAL, CKMB, CKMBINDEX, TROPONINI in the last 168 hours. BNP: Invalid input(s): POCBNP CBG: No results for input(s): GLUCAP in the last 168 hours. D-Dimer No results for input(s): DDIMER in the last 72 hours. Hgb A1c No results for input(s): HGBA1C in the last 72 hours. Lipid Profile No results for input(s): CHOL, HDL, LDLCALC, TRIG, CHOLHDL, LDLDIRECT in the last 72 hours. Thyroid function studies No results for input(s): TSH, T4TOTAL, T3FREE, THYROIDAB in the last 72 hours.  Invalid input(s): FREET3 Anemia work up No results for input(s): VITAMINB12, FOLATE, FERRITIN, TIBC, IRON, RETICCTPCT in the last 72 hours. Urinalysis    Component Value Date/Time   COLORURINE YELLOW (A) 12/16/2014 0138   APPEARANCEUR CLEAR (A) 12/16/2014 0138   LABSPEC 1.010 12/16/2014 0138    PHURINE 6.0 12/16/2014 0138   GLUCOSEU NEGATIVE 12/16/2014 0138   HGBUR NEGATIVE 12/16/2014 0138   BILIRUBINUR NEGATIVE 12/16/2014 0138   KETONESUR TRACE (A) 12/16/2014 0138   PROTEINUR NEGATIVE 12/16/2014 0138   NITRITE NEGATIVE 12/16/2014 0138   LEUKOCYTESUR NEGATIVE 12/16/2014 0138   Sepsis Labs Invalid input(s): PROCALCITONIN,  WBC,  LACTICIDVEN Microbiology Recent Results (from the past 240 hour(s))  Resp Panel by RT-PCR (Flu A&B, Covid) Nasopharyngeal Swab     Status: None   Collection Time: 02/11/20  4:11 PM   Specimen: Nasopharyngeal Swab; Nasopharyngeal(NP) swabs in vial transport medium  Result Value Ref Range Status   SARS Coronavirus 2 by RT PCR NEGATIVE NEGATIVE Final    Comment: (NOTE) SARS-CoV-2 target nucleic acids are NOT DETECTED.  The SARS-CoV-2 RNA is generally detectable in upper respiratory specimens during the acute phase of infection. The lowest concentration of SARS-CoV-2 viral copies this assay can detect is 138 copies/mL. A negative result does not preclude SARS-Cov-2 infection and should not be used as the sole basis for treatment or other patient management decisions. A negative result may occur with  improper specimen collection/handling, submission of specimen other than nasopharyngeal swab, presence of viral mutation(s) within  the areas targeted by this assay, and inadequate number of viral copies(<138 copies/mL). A negative result must be combined with clinical observations, patient history, and epidemiological information. The expected result is Negative.  Fact Sheet for Patients:  EntrepreneurPulse.com.au  Fact Sheet for Healthcare Providers:  IncredibleEmployment.be  This test is no t yet approved or cleared by the [place] FDA and  has been authorized for detection and/or diagnosis of SARS-CoV-2 by FDA under an Emergency Use Authorization (EUA). This EUA will remain  in effect (meaning this test  can be used) for the duration of the COVID-19 declaration under Section 564(b)(1) of the Act, 21 U.S.C.section 360bbb-3(b)(1), unless the authorization is terminated  or revoked sooner.       Influenza A by PCR NEGATIVE NEGATIVE Final   Influenza B by PCR NEGATIVE NEGATIVE Final    Comment: (NOTE) The Xpert Xpress SARS-CoV-2/FLU/RSV plus assay is intended as an aid in the diagnosis of influenza from Nasopharyngeal swab specimens and should not be used as a sole basis for treatment. Nasal washings and aspirates are unacceptable for Xpert Xpress SARS-CoV-2/FLU/RSV testing.  Fact Sheet for Patients: EntrepreneurPulse.com.au  Fact Sheet for Healthcare Providers: IncredibleEmployment.be  This test is not yet approved or cleared by the [place] FDA and has been authorized for detection and/or diagnosis of SARS-CoV-2 by FDA under an Emergency Use Authorization (EUA). This EUA will remain in effect (meaning this test can be used) for the duration of the COVID-19 declaration under Section 564(b)(1) of the Act, 21 U.S.C. section 360bbb-3(b)(1), unless the authorization is terminated or revoked.  Performed at [facility], [address] [place], [place] [address]     Please note: You were cared for by a hospitalist during your hospital stay. Once you are discharged, your primary care physician will handle any further medical issues. Please note that NO REFILLS for any discharge medications will be authorized once you are discharged, as it is imperative that you return to your primary care physician (or establish a relationship with a primary care physician if you do not have one) for your post hospital discharge needs so that they can reassess your need for medications and monitor your lab values.    Time coordinating discharge: 40 minutes  SIGNED:   Shelly Coss, MD  [facility] 02/17/2020, 10:48 AM Pager  [phone]  If 7PM-7AM, please contact night-coverage [email or web address] Password [email or web address]

## 2020-02-18 DIAGNOSIS — I10 Essential (primary) hypertension: Secondary | ICD-10-CM

## 2020-02-18 DIAGNOSIS — M2540 Effusion, unspecified joint: Secondary | ICD-10-CM

## 2020-02-18 DIAGNOSIS — G9009 Other idiopathic peripheral autonomic neuropathy: Secondary | ICD-10-CM

## 2020-02-18 DIAGNOSIS — S12000A Unspecified displaced fracture of first cervical vertebra, initial encounter for closed fracture: Secondary | ICD-10-CM

## 2020-03-07 ENCOUNTER — Other Ambulatory Visit: Payer: Self-pay | Admitting: Nurse Practitioner

## 2020-03-07 ENCOUNTER — Other Ambulatory Visit: Payer: Self-pay | Admitting: Internal Medicine

## 2020-03-07 DIAGNOSIS — S12101D Unspecified nondisplaced fracture of second cervical vertebra, subsequent encounter for fracture with routine healing: Secondary | ICD-10-CM

## 2020-04-07 ENCOUNTER — Ambulatory Visit: Payer: Medicare Other | Attending: Nurse Practitioner

## 2021-04-26 ENCOUNTER — Inpatient Hospital Stay
Admission: EM | Admit: 2021-04-26 | Discharge: 2021-05-06 | DRG: 552 | Disposition: A | Discharge status: Home / Self Care | Payer: Medicare Other | Attending: Internal Medicine | Admitting: Internal Medicine

## 2021-04-26 ENCOUNTER — Emergency Department: Payer: Medicare Other

## 2021-04-26 ENCOUNTER — Encounter: Payer: Self-pay | Admitting: Internal Medicine

## 2021-04-26 ENCOUNTER — Observation Stay: Payer: Medicare Other

## 2021-04-26 ENCOUNTER — Other Ambulatory Visit: Payer: Self-pay

## 2021-04-26 DIAGNOSIS — Z20822 Contact with and (suspected) exposure to covid-19: Secondary | ICD-10-CM | POA: Diagnosis present

## 2021-04-26 DIAGNOSIS — M5442 Lumbago with sciatica, left side: Secondary | ICD-10-CM | POA: Diagnosis present

## 2021-04-26 DIAGNOSIS — M545 Low back pain, unspecified: Secondary | ICD-10-CM | POA: Diagnosis not present

## 2021-04-26 DIAGNOSIS — M4807 Spinal stenosis, lumbosacral region: Secondary | ICD-10-CM | POA: Diagnosis present

## 2021-04-26 DIAGNOSIS — I10 Essential (primary) hypertension: Secondary | ICD-10-CM

## 2021-04-26 DIAGNOSIS — M48061 Spinal stenosis, lumbar region without neurogenic claudication: Secondary | ICD-10-CM | POA: Diagnosis not present

## 2021-04-26 DIAGNOSIS — M549 Dorsalgia, unspecified: Secondary | ICD-10-CM | POA: Insufficient documentation

## 2021-04-26 DIAGNOSIS — Z885 Allergy status to narcotic agent status: Secondary | ICD-10-CM

## 2021-04-26 DIAGNOSIS — R52 Pain, unspecified: Secondary | ICD-10-CM

## 2021-04-26 DIAGNOSIS — I161 Hypertensive emergency: Secondary | ICD-10-CM | POA: Diagnosis present

## 2021-04-26 DIAGNOSIS — F419 Anxiety disorder, unspecified: Secondary | ICD-10-CM | POA: Diagnosis present

## 2021-04-26 DIAGNOSIS — L570 Actinic keratosis: Secondary | ICD-10-CM | POA: Diagnosis present

## 2021-04-26 DIAGNOSIS — M4727 Other spondylosis with radiculopathy, lumbosacral region: Secondary | ICD-10-CM | POA: Diagnosis present

## 2021-04-26 DIAGNOSIS — M62838 Other muscle spasm: Secondary | ICD-10-CM | POA: Diagnosis present

## 2021-04-26 DIAGNOSIS — Z8249 Family history of ischemic heart disease and other diseases of the circulatory system: Secondary | ICD-10-CM

## 2021-04-26 DIAGNOSIS — M109 Gout, unspecified: Secondary | ICD-10-CM | POA: Diagnosis present

## 2021-04-26 DIAGNOSIS — I160 Hypertensive urgency: Secondary | ICD-10-CM

## 2021-04-26 DIAGNOSIS — Z79899 Other long term (current) drug therapy: Secondary | ICD-10-CM

## 2021-04-26 DIAGNOSIS — M5459 Other low back pain: Secondary | ICD-10-CM | POA: Diagnosis present

## 2021-04-26 DIAGNOSIS — Z7952 Long term (current) use of systemic steroids: Secondary | ICD-10-CM

## 2021-04-26 DIAGNOSIS — M25572 Pain in left ankle and joints of left foot: Secondary | ICD-10-CM | POA: Diagnosis present

## 2021-04-26 DIAGNOSIS — M1612 Unilateral primary osteoarthritis, left hip: Secondary | ICD-10-CM | POA: Diagnosis present

## 2021-04-26 LAB — CBC
HCT: 41.9 % (ref 36.0–46.0)
Hemoglobin: 13.6 g/dL (ref 12.0–15.0)
MCH: 29 pg (ref 26.0–34.0)
MCHC: 32.5 g/dL (ref 30.0–36.0)
MCV: 89.3 fL (ref 80.0–100.0)
Platelets: 275 10*3/uL (ref 150–400)
RBC: 4.69 MIL/uL (ref 3.87–5.11)
RDW: 12.9 % (ref 11.5–15.5)
WBC: 8.1 10*3/uL (ref 4.0–10.5)
nRBC: 0 % (ref 0.0–0.2)

## 2021-04-26 LAB — COMPREHENSIVE METABOLIC PANEL
ALT: 16 U/L (ref 0–44)
AST: 24 U/L (ref 15–41)
Albumin: 3.8 g/dL (ref 3.5–5.0)
Alkaline Phosphatase: 50 U/L (ref 38–126)
Anion gap: 14 (ref 5–15)
BUN: 32 mg/dL — ABNORMAL HIGH (ref 8–23)
CO2: 26 mmol/L (ref 22–32)
Calcium: 9.9 mg/dL (ref 8.9–10.3)
Chloride: 99 mmol/L (ref 98–111)
Creatinine, Ser: 0.72 mg/dL (ref 0.44–1.00)
GFR, Estimated: >60 mL/min (ref >60)
Glucose, Bld: 139 mg/dL — ABNORMAL HIGH (ref 70–99)
Potassium: 3.5 mmol/L (ref 3.5–5.1)
Sodium: 139 mmol/L (ref 135–145)
Total Bilirubin: 0.7 mg/dL (ref 0.3–1.2)
Total Protein: 6.5 g/dL (ref 6.5–8.1)

## 2021-04-26 LAB — RESP PANEL BY RT-PCR (FLU A&B, COVID) ARPGX2
Influenza A by PCR: NEGATIVE
Influenza B by PCR: NEGATIVE
SARS Coronavirus 2 by RT PCR: NEGATIVE

## 2021-04-26 MED ORDER — ENOXAPARIN SODIUM 40 MG/0.4ML IJ SOSY
40.0000 mg | PREFILLED_SYRINGE | INTRAMUSCULAR | Status: DC
  Administered 2021-04-26 – 2021-05-05 (×10): 40 mg via SUBCUTANEOUS
  Filled 2021-04-26 (×10): qty 0.4

## 2021-04-26 MED ORDER — PREDNISONE 20 MG PO TABS
40.0000 mg | ORAL_TABLET | Freq: Once | ORAL | Status: AC
  Administered 2021-04-26: 40 mg via ORAL
  Filled 2021-04-26: qty 2

## 2021-04-26 MED ORDER — ACETAMINOPHEN 325 MG PO TABS: 650.0000 mg | ORAL_TABLET | Freq: Four times a day (QID) | ORAL | Status: DC | PRN

## 2021-04-26 MED ORDER — FENTANYL CITRATE PF 50 MCG/ML IJ SOSY
50.0000 ug | PREFILLED_SYRINGE | INTRAMUSCULAR | Status: DC | PRN
  Administered 2021-04-26 (×3): 50 ug via INTRAVENOUS
  Filled 2021-04-26 (×3): qty 1

## 2021-04-26 MED ORDER — KETOROLAC TROMETHAMINE 30 MG/ML IJ SOLN
15.0000 mg | Freq: Once | INTRAMUSCULAR | Status: AC
  Administered 2021-04-26: 15 mg via INTRAVENOUS
  Filled 2021-04-26: qty 1

## 2021-04-26 MED ORDER — HYDRALAZINE HCL 20 MG/ML IJ SOLN
10.0000 mg | INTRAMUSCULAR | Status: DC | PRN
Start: 2021-04-26 — End: 2021-04-29
  Administered 2021-04-26 – 2021-04-29 (×4): 10 mg via INTRAVENOUS
  Filled 2021-04-26 (×4): qty 1

## 2021-04-26 MED ORDER — LOSARTAN POTASSIUM 50 MG PO TABS
100.0000 mg | ORAL_TABLET | Freq: Every day | ORAL | Status: DC
  Administered 2021-04-27 – 2021-05-06 (×10): 100 mg via ORAL
  Filled 2021-04-26 (×10): qty 2

## 2021-04-26 MED ORDER — HYDROCODONE-ACETAMINOPHEN 5-325 MG PO TABS
1.0000 | ORAL_TABLET | Freq: Once | ORAL | Status: AC
  Administered 2021-04-26: 1 via ORAL
  Filled 2021-04-26: qty 1

## 2021-04-26 MED ORDER — SODIUM CHLORIDE 0.9 % IV BOLUS
500.0000 mL | Freq: Once | INTRAVENOUS | Status: AC
Start: 2021-04-26 — End: 2021-04-26
  Administered 2021-04-26: 500 mL via INTRAVENOUS

## 2021-04-26 MED ORDER — LOSARTAN POTASSIUM 50 MG PO TABS
100.0000 mg | ORAL_TABLET | Freq: Once | ORAL | Status: AC
Start: 2021-04-26 — End: 2021-04-26
  Administered 2021-04-26: 100 mg via ORAL
  Filled 2021-04-26: qty 2

## 2021-04-26 MED ORDER — FENTANYL CITRATE PF 50 MCG/ML IJ SOSY
25.0000 ug | PREFILLED_SYRINGE | INTRAMUSCULAR | Status: DC
  Administered 2021-04-26 – 2021-04-27 (×4): 25 ug via INTRAVENOUS
  Filled 2021-04-26 (×4): qty 1

## 2021-04-26 MED ORDER — VITAMIN B-12 1000 MCG PO TABS
1000.0000 ug | ORAL_TABLET | Freq: Every day | ORAL | Status: DC
  Administered 2021-04-27 – 2021-05-06 (×10): 1000 ug via ORAL
  Filled 2021-04-26 (×10): qty 1

## 2021-04-26 MED ORDER — ONDANSETRON HCL 4 MG/2ML IJ SOLN
4.0000 mg | Freq: Once | INTRAMUSCULAR | Status: AC
  Administered 2021-04-26: 4 mg via INTRAVENOUS
  Filled 2021-04-26: qty 2

## 2021-04-26 MED ORDER — ACETAMINOPHEN 325 MG RE SUPP
650.0000 mg | Freq: Four times a day (QID) | RECTAL | Status: DC | PRN
  Filled 2021-04-26: qty 2

## 2021-04-26 MED ORDER — METHOCARBAMOL 1000 MG/10ML IJ SOLN
500.0000 mg | Freq: Four times a day (QID) | INTRAVENOUS | Status: DC | PRN
  Administered 2021-04-28: 500 mg via INTRAVENOUS
  Filled 2021-04-26 (×4): qty 5

## 2021-04-26 NOTE — ED Provider Notes (Signed)
[facility] Provider Note    Event Date/Time   First MD Initiated Contact with Patient 04/26/21 1318     (approximate)   History   Leg Pain (X 1 week left leg pain 9/10radiates from buttox down leg , seen multiple physician dr Sabra Heck stated gout per emd , foot is cold )   HPI  Sherry [name] is a 88 y.o. female with a history of arthritis as well as hypertension presents to the ER for evaluation of rapidly progressive left lower extremity pain that initially started in the ankle now rating up to the back buttock and low back.  Not lost any control of her bowel or bladder.  Denies any numbness or tingling.  Denies any falls or injury.  Denies any chest pain no abdominal pain no groin pain.     Physical Exam   Triage Vital Signs: ED Triage Vitals [04/26/21 1322]  Enc Vitals Group     BP (!) 191/61     Pulse Rate 77     Resp 17     Temp 98.2 F (36.8 C)     Temp src      SpO2 99 %     Weight      Height      Head Circumference      Peak Flow      Pain Score 8     Pain Loc      Pain Edu?      Excl. in [place]?     Most recent vital signs: Vitals:   04/26/21 1322  BP: (!) 191/61  Pulse: 77  Resp: 17  Temp: 98.2 F (36.8 C)  SpO2: 99%     Constitutional: Alert  Eyes: Conjunctivae are normal.  Head: Atraumatic. Nose: No congestion/rhinnorhea. Mouth/Throat: Mucous membranes are moist.   Neck: Painless ROM.  Cardiovascular:   Good peripheral circulation. Respiratory: Normal respiratory effort.  No retractions.  Gastrointestinal: Soft and nontender.  Musculoskeletal:  no deformity.  Well perfused strong PT and DP pulses bilaterally.  Compartments are soft brisk cap refill.  No overlying erythema.  No worsening pain with straight leg raise.  No edema Neurologic:  MAE spontaneously. No gross focal neurologic deficits are appreciated.  Skin:  Skin is warm, dry and intact. No rash noted. Psychiatric: Mood and affect are normal. Speech and  behavior are normal.    ED Results / Procedures / Treatments   Labs (all labs ordered are listed, but only abnormal results are displayed) Labs Reviewed  COMPREHENSIVE METABOLIC PANEL - Abnormal; Notable for the following components:      Result Value   Glucose, Bld 139 (*)    BUN 32 (*)    All other components within normal limits  CBC     EKG  ED ECG REPORT I, Merlyn Lot, the attending physician, personally viewed and interpreted this ECG.   Date: 04/26/2021  EKG Time: 13:52  Rate: 75  Rhythm: sinus  Axis: left  Intervals:  rightbbb  ST&T Change:  no stemi, nonspecific st abn    RADIOLOGY Please see ED Course for my review and interpretation.  I personally reviewed all radiographic images ordered to evaluate for the above acute complaints and reviewed radiology reports and findings.  These findings were personally discussed with the patient.  Please see medical record for radiology report.    PROCEDURES:  Critical Care performed:   Procedures   MEDICATIONS ORDERED IN ED: Medications  fentaNYL (SUBLIMAZE) injection 50  mcg (50 mcg Intravenous Given 04/26/21 1507)  ondansetron (ZOFRAN) injection 4 mg (4 mg Intravenous Given 04/26/21 1339)  losartan (COZAAR) tablet 100 mg (100 mg Oral Given 04/26/21 1339)  HYDROcodone-acetaminophen (NORCO/VICODIN) 5-325 MG per tablet 1 tablet (1 tablet Oral Given 04/26/21 1531)  ketorolac (TORADOL) 30 MG/ML injection 15 mg (15 mg Intravenous Given 04/26/21 1531)  sodium chloride 0.9 % bolus 500 mL (500 mLs Intravenous New Bag/Given 04/26/21 1533)     IMPRESSION / MDM / ASSESSMENT AND PLAN / ED COURSE  I reviewed the triage vital signs and the nursing notes.                              Differential diagnosis includes, but is not limited to, sciatica musculoskeletal strain, stone, UTI, spinal stenosis, fracture, ischemia  Patient presenting to the ER with acute progressively worsening low back pain radiation to her ankle  now up into her thigh.  Not consistent with acute limb ischemia compartments are soft.  No report of any trauma.  Suspect sciatica but given the rapidly progressive nature of her symptoms and age we will order MRI to further evaluate.      Clinical Course as of 04/26/21 1536  Sun Apr 26, 2021  1530 MRI shows evidence of degenerative disc disease and foraminal stenosis consistent with sciatica.  No cauda equina.  Patient still having moderate pain.  Will give additional pain medication anti-inflammatory and prednisone.  As patient still having pain and having difficulty completing ADLs at with current pain level we will see if we can get her pain controlled to the point where she would be comfortable going.  If not patient may require hospitalization for pain control. [PR]    Clinical Course User Index [PR] Merlyn Lot, MD     FINAL CLINICAL IMPRESSION(S) / ED DIAGNOSES   Final diagnoses:  Acute left-sided low back pain with left-sided sciatica     Rx / DC Orders   ED Discharge Orders     None        Note:  This document was prepared using Dragon voice recognition software and may include unintentional dictation errors.    Merlyn Lot, MD 04/26/21 1537

## 2021-04-26 NOTE — ED Notes (Signed)
Ed md notified pt is severe pain 9/10 , multiple pain medications administered with little to no pain relief

## 2021-04-26 NOTE — H&P (Signed)
History and Physical    Sherry [name] FIE:[id number] DOB: 1930/10/14 DOA: 04/26/2021  PCP: Rusty Aus, MD  Patient coming from: Home.  Chief Complaint: Back pain.  HPI: Sherry [name] is a 88 y.o. female with history of hypertension presents to the ER with complaints of increasing back pain and left ankle pain.  The pain is radiating from the low back all the way to the ankle the pain is mostly in the left ankle.  Denies any trauma or fall.  Patient has been having this pain for almost a week.  Patient's primary care physician had prescribed hydrocodone despite taking which patient's pain has not improved.  Denies any incontinence of urine or bowel.  ED Course: In the ER MRI of the L-spine was done which shows degenerative changes and canal and foraminal stenosis mostly around the L4-L5 and L5-S1 area.  Patient was given multiple doses of pain relief medication and also steroid despite which patient's pain is not improving will be admitted for further observation.  Labs are largely unremarkable.  Blood pressure is found to be markedly elevated patient was given Cozaar.  We will also keep patient.  IV hydralazine and COVID test is pending.  Review of Systems: As per HPI, rest all negative.   Past Medical History:  Diagnosis Date   Actinic keratosis    Hypertension     Past Surgical History:  Procedure Laterality Date   BACK SURGERY     MOHS SURGERY       reports that she has never smoked. She has never used smokeless tobacco. She reports current alcohol use. She reports that she does not use drugs.  Allergies  Allergen Reactions   Codeine    Morphine And Related Other (See Comments)    Respiratory Distress    Percocet [Oxycodone-Acetaminophen]     Family History  Problem Relation Age of Onset   Hypertension Father     Prior to Admission medications   Medication Sig Start Date End Date Taking? Authorizing Provider  acetaminophen (TYLENOL) 500 MG tablet Take  500-1,000 mg by mouth every 6 (six) hours as needed for mild pain or fever.    [provider]  amLODipine (NORVASC) 10 MG tablet Take 1 tablet (10 mg total) by mouth daily. 02/15/20   Shelly Coss, MD  Biotin 5 MG TABS Take 5 mg by mouth daily.    [provider]  celecoxib (CELEBREX) 200 MG capsule Take 200 mg by mouth daily. 07/14/16   [provider]  cholecalciferol (VITAMIN D) 25 MCG (1000 UNIT) tablet Take 2,000 Units by mouth daily.    [provider]  Cyanocobalamin (B-12) 1000 MCG CAPS Take 1,000 mcg by mouth daily.    [provider]  gabapentin (NEURONTIN) 100 MG capsule Take 100-200 mg by mouth at bedtime. 04/24/21   [provider]  HYDROcodone-acetaminophen (NORCO/VICODIN) 5-325 MG tablet Take 1 tablet by mouth every 6 (six) hours as needed. 04/22/21   [provider]  losartan (COZAAR) 100 MG tablet Take 1 tablet (100 mg total) by mouth daily. 02/15/20   Shelly Coss, MD  meclizine (ANTIVERT) 25 MG tablet Take 1 tablet (25 mg total) by mouth every 6 (six) hours as needed for dizziness. 02/17/20   Shelly Coss, MD  olmesartan-hydrochlorothiazide (BENICAR HCT) 40-12.5 MG tablet Take 1 tablet by mouth daily. 02/11/21   [provider]  polyethylene glycol (MIRALAX / GLYCOLAX) 17 g packet Take 17 g by mouth daily. 02/18/20  Shelly Coss, MD  predniSONE (DELTASONE) 20 MG tablet Take 20 mg by mouth daily. 04/22/21   [provider]    Physical Exam: Constitutional: Moderately built and nourished. Vitals:   04/26/21 1322  BP: (!) 191/61  Pulse: 77  Resp: 17  Temp: 98.2 F (36.8 C)  SpO2: 99%   Eyes: Anicteric no pallor. ENMT: No discharge from the ears eyes nose and mouth. Neck: No mass felt.  No neck rigidity. Respiratory: No rhonchi or crepitations. Cardiovascular: S1-S2 heard. Abdomen: Soft nontender bowel sound present. Musculoskeletal: No obvious edema or rash.  Has good peripheral  pulses.  Right now the pain is improved after giving pain medication but patient states the pain medication wears off easily. Skin: No rash. Neurologic: Alert awake oriented time place and person.  Moves all extremities. Psychiatric: Appears normal.  Normal affect.   Labs on Admission: I have personally reviewed following labs and imaging studies  CBC: Recent Labs  Lab 04/26/21 1333  WBC 8.1  HGB 13.6  HCT 41.9  MCV 89.3  PLT 160   Basic Metabolic Panel: Recent Labs  Lab 04/26/21 1333  NA 139  K 3.5  CL 99  CO2 26  GLUCOSE 139*  BUN 32*  CREATININE 0.72  CALCIUM 9.9   GFR: CrCl cannot be calculated (Unknown ideal weight.). Liver Function Tests: Recent Labs  Lab 04/26/21 1333  AST 24  ALT 16  ALKPHOS 50  BILITOT 0.7  PROT 6.5  ALBUMIN 3.8   No results for input(s): LIPASE, AMYLASE in the last 168 hours. No results for input(s): AMMONIA in the last 168 hours. Coagulation Profile: No results for input(s): INR, PROTIME in the last 168 hours. Cardiac Enzymes: No results for input(s): CKTOTAL, CKMB, CKMBINDEX, TROPONINI in the last 168 hours. BNP (last 3 results) No results for input(s): PROBNP in the last 8760 hours. HbA1C: No results for input(s): HGBA1C in the last 72 hours. CBG: No results for input(s): GLUCAP in the last 168 hours. Lipid Profile: No results for input(s): CHOL, HDL, LDLCALC, TRIG, CHOLHDL, LDLDIRECT in the last 72 hours. Thyroid Function Tests: No results for input(s): TSH, T4TOTAL, FREET4, T3FREE, THYROIDAB in the last 72 hours. Anemia Panel: No results for input(s): VITAMINB12, FOLATE, FERRITIN, TIBC, IRON, RETICCTPCT in the last 72 hours. Urine analysis:    Component Value Date/Time   COLORURINE YELLOW (A) 12/16/2014 0138   APPEARANCEUR CLEAR (A) 12/16/2014 0138   LABSPEC 1.010 12/16/2014 0138   PHURINE 6.0 12/16/2014 0138   GLUCOSEU NEGATIVE 12/16/2014 0138   HGBUR NEGATIVE 12/16/2014 0138   BILIRUBINUR NEGATIVE 12/16/2014 0138    KETONESUR TRACE (A) 12/16/2014 0138   PROTEINUR NEGATIVE 12/16/2014 0138   NITRITE NEGATIVE 12/16/2014 0138   LEUKOCYTESUR NEGATIVE 12/16/2014 0138   Sepsis Labs: @LABRCNTIP (procalcitonin:4,lacticidven:4) )No results found for this or any previous visit (from the past 240 hour(s)).   Radiological Exams on Admission: MR LUMBAR SPINE WO CONTRAST  Result Date: 04/26/2021 CLINICAL DATA:  Low back pain, cauda equina syndrome suspected EXAM: MRI LUMBAR SPINE WITHOUT CONTRAST TECHNIQUE: Multiplanar, multisequence MR imaging of the lumbar spine was performed. No intravenous contrast was administered. COMPARISON:  No recent imaging.  Prior is from 2006 FINDINGS: Motion artifact is present. Segmentation: Standard. Alignment: Grade 1 anterolisthesis at L5-S1 and L4-L5. Retrolisthesis at L2-L3 and L3-L4. Vertebrae: Degenerative endplate irregularity. Lumbar vertebral body heights are otherwise maintained. Chronic appearing mild T10 compression fracture. Minor degenerative endplate marrow edema. Mild marrow edema associated with right L3-L4 facet arthropathy. No suspicious  osseous lesion. Conus medullaris and cauda equina: Conus extends to the superior L2 level. Conus and cauda equina appear normal. Paraspinal and other soft tissues: Unremarkable. Disc levels: T12-L1: Disc bulge eccentric to the right. Mild facet arthropathy. No significant canal or foraminal stenosis. L1-L2: Disc bulge with endplate osteophytic ridging. Mild facet arthropathy. No significant canal or foraminal stenosis. L2-L3: Disc bulge with endplate osteophytic ridging. Mild facet arthropathy with ligamentum flavum infolding. No significant canal stenosis. Mild right and moderate left foraminal stenosis. L3-L4: Disc bulge with endplate osteophytic ridging. Mild facet arthropathy with ligamentum flavum infolding. No significant canal stenosis. Mild right and mild to moderate left foraminal stenosis. L4-L5: Disc bulge with superimposed right  foraminal protrusion. Marked facet arthropathy with ligamentum flavum infolding. Moderate canal stenosis. Narrowing of the right greater than left subarticular recesses. Moderate to marked right and mild left foraminal stenosis. L5-S1: Disc bulge. Marked facet arthropathy with ligamentum flavum infolding. Moderate canal stenosis. Narrowing of the subarticular recesses. Mild to moderate foraminal stenosis, greater on the right. IMPRESSION: Multilevel degenerative changes as detailed above. Canal and subarticular recess stenosis are greatest at L4-L5 and L5-S1. Foraminal stenosis is greatest on the right at L4-L5. Facet arthropathy is greatest at L4-L5. Electronically Signed   By: Macy Mis M.D.   On: 04/26/2021 14:57   DG Hip Unilat W or Wo Pelvis 2-3 Views Left  Result Date: 04/26/2021 CLINICAL DATA:  Left leg pain EXAM: DG HIP (WITH OR WITHOUT PELVIS) 2-3V LEFT COMPARISON:  08/13/2017 FINDINGS: There is no evidence of hip fracture or dislocation. Left hip joint space is relatively well preserved. There is no evidence of arthropathy or other focal bone abnormality. IMPRESSION: Negative. Electronically Signed   By: Davina Poke D.O.   On: 04/26/2021 15:01    EKG: Independently reviewed.  Normal sinus rhythm RBBB nonspecific ST-T changes.  Assessment/Plan Principal Problem:   Back pain Active Problems:   Hypertensive urgency   Low back pain    Low back pain radiating to left lower extremity up to the ankle likely could be from sciatica.  On pain relief medication and muscle relaxant.  We will get physical therapy consult.  If pain does not improve may consult orthopedics.  X-ray of the left ankle is pending. Hypertensive urgency likely precipitated by pain.  We will continue patient's losartan and a half Patient.  IV hydralazine.  Follow blood pressure trends.  Patient's home medication needs to be reconciled and verified.   COVID test is pending.   DVT prophylaxis: Lovenox. Code  Status: Full code. Family Communication: Discussed with patient. Disposition Plan: Home. Consults called: Physical therapy. Admission status: Observation.   Sherry Patience MD [facility] Pager [phone].  If 7PM-7AM, please contact night-coverage [email or web address] Password [facility]  04/26/2021, 5:41 PM

## 2021-04-26 NOTE — ED Triage Notes (Signed)
X 1 week left leg pain radiates from buttox down leg , seen multiple physician dr Sabra Heck stated gout per emd , foot is cold , pt lives at home

## 2021-04-26 NOTE — ED Triage Notes (Signed)
50 mcg fentanyl and 4 mg zofran administered by ems

## 2021-04-26 NOTE — ED Triage Notes (Signed)
Pulses fet in bilateral feet

## 2021-04-27 DIAGNOSIS — M5442 Lumbago with sciatica, left side: Secondary | ICD-10-CM | POA: Diagnosis present

## 2021-04-27 DIAGNOSIS — M48061 Spinal stenosis, lumbar region without neurogenic claudication: Principal | ICD-10-CM

## 2021-04-27 DIAGNOSIS — M4727 Other spondylosis with radiculopathy, lumbosacral region: Secondary | ICD-10-CM | POA: Diagnosis present

## 2021-04-27 DIAGNOSIS — M1612 Unilateral primary osteoarthritis, left hip: Secondary | ICD-10-CM | POA: Diagnosis present

## 2021-04-27 DIAGNOSIS — I159 Secondary hypertension, unspecified: Secondary | ICD-10-CM | POA: Diagnosis not present

## 2021-04-27 DIAGNOSIS — M25572 Pain in left ankle and joints of left foot: Secondary | ICD-10-CM | POA: Diagnosis present

## 2021-04-27 DIAGNOSIS — Z885 Allergy status to narcotic agent status: Secondary | ICD-10-CM | POA: Diagnosis not present

## 2021-04-27 DIAGNOSIS — F419 Anxiety disorder, unspecified: Secondary | ICD-10-CM | POA: Diagnosis present

## 2021-04-27 DIAGNOSIS — M5441 Lumbago with sciatica, right side: Secondary | ICD-10-CM

## 2021-04-27 DIAGNOSIS — M4807 Spinal stenosis, lumbosacral region: Secondary | ICD-10-CM | POA: Diagnosis present

## 2021-04-27 DIAGNOSIS — I160 Hypertensive urgency: Secondary | ICD-10-CM | POA: Diagnosis not present

## 2021-04-27 DIAGNOSIS — M62838 Other muscle spasm: Secondary | ICD-10-CM | POA: Diagnosis present

## 2021-04-27 DIAGNOSIS — L570 Actinic keratosis: Secondary | ICD-10-CM | POA: Diagnosis present

## 2021-04-27 DIAGNOSIS — I161 Hypertensive emergency: Secondary | ICD-10-CM | POA: Diagnosis present

## 2021-04-27 DIAGNOSIS — M5459 Other low back pain: Secondary | ICD-10-CM | POA: Insufficient documentation

## 2021-04-27 DIAGNOSIS — M545 Low back pain, unspecified: Secondary | ICD-10-CM | POA: Diagnosis present

## 2021-04-27 DIAGNOSIS — M109 Gout, unspecified: Secondary | ICD-10-CM | POA: Diagnosis present

## 2021-04-27 DIAGNOSIS — I10 Essential (primary) hypertension: Secondary | ICD-10-CM | POA: Diagnosis present

## 2021-04-27 DIAGNOSIS — Z79899 Other long term (current) drug therapy: Secondary | ICD-10-CM | POA: Diagnosis not present

## 2021-04-27 DIAGNOSIS — Z20822 Contact with and (suspected) exposure to covid-19: Secondary | ICD-10-CM | POA: Diagnosis present

## 2021-04-27 DIAGNOSIS — Z7952 Long term (current) use of systemic steroids: Secondary | ICD-10-CM | POA: Diagnosis not present

## 2021-04-27 DIAGNOSIS — Z8249 Family history of ischemic heart disease and other diseases of the circulatory system: Secondary | ICD-10-CM | POA: Diagnosis not present

## 2021-04-27 LAB — CBC
HCT: 41.2 % (ref 36.0–46.0)
Hemoglobin: 13.7 g/dL (ref 12.0–15.0)
MCH: 29.4 pg (ref 26.0–34.0)
MCHC: 33.3 g/dL (ref 30.0–36.0)
MCV: 88.4 fL (ref 80.0–100.0)
Platelets: 270 10*3/uL (ref 150–400)
RBC: 4.66 MIL/uL (ref 3.87–5.11)
RDW: 13 % (ref 11.5–15.5)
WBC: 9.5 10*3/uL (ref 4.0–10.5)
nRBC: 0 % (ref 0.0–0.2)

## 2021-04-27 LAB — BASIC METABOLIC PANEL
Anion gap: 14 (ref 5–15)
BUN: 36 mg/dL — ABNORMAL HIGH (ref 8–23)
CO2: 27 mmol/L (ref 22–32)
Calcium: 9.4 mg/dL (ref 8.9–10.3)
Chloride: 101 mmol/L (ref 98–111)
Creatinine, Ser: 0.71 mg/dL (ref 0.44–1.00)
GFR, Estimated: >60 mL/min (ref >60)
Glucose, Bld: 106 mg/dL — ABNORMAL HIGH (ref 70–99)
Potassium: 4.4 mmol/L (ref 3.5–5.1)
Sodium: 142 mmol/L (ref 135–145)

## 2021-04-27 MED ORDER — HYDROCODONE-ACETAMINOPHEN 5-325 MG PO TABS
1.0000 | ORAL_TABLET | ORAL | Status: DC | PRN
  Administered 2021-04-27: 1 via ORAL
  Filled 2021-04-27: qty 1

## 2021-04-27 MED ORDER — METHYLPREDNISOLONE SODIUM SUCC 40 MG IJ SOLR
40.0000 mg | Freq: Every day | INTRAMUSCULAR | Status: DC
  Administered 2021-04-27 – 2021-04-29 (×3): 40 mg via INTRAVENOUS
  Filled 2021-04-27 (×5): qty 1

## 2021-04-27 MED ORDER — HYDROMORPHONE HCL 1 MG/ML IJ SOLN
0.2500 mg | INTRAMUSCULAR | Status: DC | PRN
  Administered 2021-04-27 – 2021-04-28 (×3): 0.25 mg via INTRAVENOUS
  Filled 2021-04-27 (×4): qty 1

## 2021-04-27 MED ORDER — OXYCODONE-ACETAMINOPHEN 5-325 MG PO TABS
1.0000 | ORAL_TABLET | ORAL | Status: DC | PRN
Start: 2021-04-27 — End: 2021-04-29
  Administered 2021-04-27 – 2021-04-29 (×3): 1 via ORAL
  Filled 2021-04-27 (×5): qty 1

## 2021-04-27 MED ORDER — SODIUM CHLORIDE 0.9 % IV SOLN: INTRAVENOUS | Status: DC

## 2021-04-27 MED ORDER — ONDANSETRON HCL 4 MG/2ML IJ SOLN
4.0000 mg | Freq: Four times a day (QID) | INTRAMUSCULAR | Status: DC | PRN
  Administered 2021-04-27 – 2021-05-05 (×6): 4 mg via INTRAVENOUS
  Filled 2021-04-27 (×6): qty 2

## 2021-04-27 MED ORDER — METHOCARBAMOL 500 MG PO TABS
500.0000 mg | ORAL_TABLET | Freq: Four times a day (QID) | ORAL | Status: DC | PRN
  Administered 2021-04-27 – 2021-05-05 (×7): 500 mg via ORAL
  Filled 2021-04-27 (×9): qty 1

## 2021-04-27 MED ORDER — LIDOCAINE 5 % EX PTCH
1.0000 | MEDICATED_PATCH | CUTANEOUS | Status: DC
  Administered 2021-04-27 – 2021-04-29 (×3): 1 via TRANSDERMAL
  Filled 2021-04-27 (×4): qty 1

## 2021-04-27 MED ORDER — KETOROLAC TROMETHAMINE 15 MG/ML IJ SOLN
15.0000 mg | Freq: Once | INTRAMUSCULAR | Status: AC
  Administered 2021-04-27: 15 mg via INTRAVENOUS
  Filled 2021-04-27: qty 1

## 2021-04-27 NOTE — Progress Notes (Signed)
Patient refused Dilaudid and oxycodone for pain management. Patient reported she received something different this morning. Reviewed MAR with patient and explained that she had both this morning. Patient still refused, but stated she wants what she had this morning.

## 2021-04-27 NOTE — Progress Notes (Signed)
Met with the patient and her husband at the bedside, They stated that they will not agree to her going to [facility], They want to go to [place] to see her Nuerosurgeon I explained that the Doctor from [facility] would not come to [facility], they stated understanding and said that once she is discharged her husband will take her to [facility] himself. He stated that he does not see how we can make any recommendations without knowing the cause of what is causing her pain, I explained that I could have the Doctor speak to them, The declined and stated just to tell the Doctor they will go to [facility] once discharged

## 2021-04-27 NOTE — Evaluation (Signed)
Physical Therapy Evaluation Patient Details Name: Sherry [name] MRN: [id number] DOB: 13-Sep-1930 Today's Date: 04/27/2021  History of Present Illness  Pt is an 88 y.o. female with history of hypertension presents to the ER with complaints of increasing back pain and left ankle pain.  The pain is radiating from the low back all the way to the ankle the pain is mostly in the left ankle.  Denies any trauma or fall.  Patient has been having this pain for almost a week. MD assessment includes:Intractable lower back pain, spinal stenosis at L5-S1 and L4-L5, and hypertensive urgency.   Clinical Impression  Pt reported severe pain at rest in supine that was not improved or worsened with positioning. Pt required physical assistance with bed mobility tasks and transfers and once in standing was unable to advance either LE during amb attempt.  Pt's HR increased from the 90s to 122 while in standing with SpO2 WNL on room air.  Pt will benefit from PT services in a SNF setting upon discharge to safely address deficits listed in patient problem list for decreased caregiver assistance and eventual return to PLOF.        Recommendations for follow up therapy are one component of a multi-disciplinary discharge planning process, led by the attending physician.  Recommendations may be updated based on patient status, additional functional criteria and insurance authorization.  Follow Up Recommendations Skilled nursing-short term rehab (<3 hours/day)    Assistance Recommended at Discharge Frequent or constant Supervision/Assistance  Patient can return home with the following  Two people to help with walking and/or transfers;Two people to help with bathing/dressing/bathroom;Assistance with cooking/housework;Direct supervision/assist for medications management;Assist for transportation    Equipment Recommendations Other (comment) (TBD at next venue of care)  Recommendations for Other Services        Functional Status Assessment Patient has had a recent decline in their functional status and demonstrates the ability to make significant improvements in function in a reasonable and predictable amount of time.     Precautions / Restrictions Precautions Precautions: Fall Restrictions Weight Bearing Restrictions: No      Mobility  Bed Mobility Overal bed mobility: Needs Assistance Bed Mobility: Supine to Sit, Sit to Supine     Supine to sit: Mod assist Sit to supine: Mod assist   General bed mobility comments: Mod A for BLE and trunk control    Transfers Overall transfer level: Needs assistance Equipment used: Rolling walker (2 wheels) Transfers: Sit to/from Stand Sit to Stand: Min assist, From elevated surface           General transfer comment: Min A and increased time required to come to standing    Ambulation/Gait               General Gait Details: Pt unable to bear weight through the LLE while in standing, stood with knee flexed with L toes gently touching the floor; unable to ambulate  Stairs            Wheelchair Mobility    Modified Rankin (Stroke Patients Only)       Balance Overall balance assessment: Needs assistance   Sitting balance-Leahy Scale: Good       Standing balance-Leahy Scale: Poor Standing balance comment: Heavy lean on the RW for support                             Pertinent Vitals/Pain Pain Assessment Pain Assessment: 0-10 Pain Score:  10-Worst pain ever Pain Location: low back down LLE Pain Descriptors / Indicators: Aching, Burning, Grimacing, Guarding, Moaning Pain Intervention(s): Limited activity within patient's tolerance, Monitored during session, Premedicated before session, Patient requesting pain meds-RN notified    Home Living Family/patient expects to be discharged to:: Private residence Living Arrangements: Spouse/significant other Available Help at Discharge: Family;Available 24  hours/day Type of Home: House Home Access: Level entry       Home Layout: One level Home Equipment: Conservation officer, nature (2 wheels)      Prior Function               Mobility Comments: Ind amb community distances without an AD, no fall history ADLs Comments: Ind with ADLs     Hand Dominance        Extremity/Trunk Assessment   Upper Extremity Assessment Upper Extremity Assessment: Generalized weakness    Lower Extremity Assessment Lower Extremity Assessment: Generalized weakness       Communication   Communication: No difficulties  Cognition Arousal/Alertness: Awake/alert Behavior During Therapy: WFL for tasks assessed/performed Overall Cognitive Status: Difficult to assess                                          General Comments      Exercises Total Joint Exercises Ankle Circles/Pumps: AROM, Strengthening, Both, 10 reps Quad Sets: Strengthening, Both, 10 reps Other Exercises Other Exercises: Log roll training to assist with bed mobility tasks Other Exercises: HEP education for BLE APs, QS, GS, and LAQs x 10 each to 3-4x/day to pt's tolerance   Assessment/Plan    PT Assessment Patient needs continued PT services  PT Problem List Decreased strength;Decreased activity tolerance;Decreased balance;Decreased mobility;Decreased knowledge of use of DME;Pain       PT Treatment Interventions DME instruction;Gait training;Functional mobility training;Therapeutic activities;Therapeutic exercise;Patient/family education    PT Goals (Current goals can be found in the Care Plan section)  Acute Rehab PT Goals Patient Stated Goal: Less pain with mobiltiy PT Goal Formulation: With patient Time For Goal Achievement: 05/10/21 Potential to Achieve Goals: Good    Frequency Min 2X/week     Co-evaluation               AM-PAC PT "6 Clicks" Mobility  Outcome Measure Help needed turning from your back to your side while in a flat bed without using  bedrails?: A Lot Help needed moving from lying on your back to sitting on the side of a flat bed without using bedrails?: A Lot Help needed moving to and from a bed to a chair (including a wheelchair)?: A Lot Help needed standing up from a chair using your arms (e.g., wheelchair or bedside chair)?: A Lot Help needed to walk in hospital room?: Total Help needed climbing 3-5 steps with a railing? : Total 6 Click Score: 10    End of Session Equipment Utilized During Treatment: Gait belt Activity Tolerance: Patient limited by pain Patient left: in bed;with call bell/phone within reach;with bed alarm set Nurse Communication: Mobility status PT Visit Diagnosis: Difficulty in walking, not elsewhere classified (R26.2);Muscle weakness (generalized) (M62.81);Pain Pain - Right/Left: Left Pain - part of body: Leg;Ankle and joints of foot;Hip    Time: 4696-2952 PT Time Calculation (min) (ACUTE ONLY): 27 min   Charges:   PT Evaluation $PT Eval Moderate Complexity: 1 Mod PT Treatments $Therapeutic Activity: 8-22 mins  Linus Salmons PT, DPT 04/27/21, 1:44 PM

## 2021-04-27 NOTE — Progress Notes (Signed)
°  Progress Note   Patient: Sherry [name] PIR:[id number] DOB: [birth year] DOA: 04/26/2021     0 DOS: the patient was seen and examined on 04/27/2021   Brief hospital course: ZEMIRA ZEHRING is a 88 y.o. female with history of hypertension presents to the ER with complaints of increasing back pain and left ankle pain.  CT scan showed spinal stenosis at the L4-L5 and L5-S1.  Started on pain medicine  Assessment and Plan: Intractable lower back pain. Spinal stenosis at L5-S1 and L4-L5. I reviewed patient CT scan, no acute changes.  Patient has a chronic spinal stenosis, was doing well at home.  She had some ankle pain on Tuesday, started have severe back pain on Friday.  This most likely secondary to muscle strain as patient was not able to walk properly due to ankle pain. Currently, patient does not have incontinence of urine, pain is worse with movement.  Patient also had a history of pain medicine intolerance, she had a nausea vomiting due to oral pain medicine, she also had experience of decreased breathing drive when she receiving IV pain medicine when she was young.  These are not allergies, there is a reaction to narcotics. At this point, I will start a lower dose Dilaudid for severe pain, Percocet for moderate pain. I will also start steroids for muscle inflammation. Patient has been having poor appetite, I will start some fluids. Start senna for constipation.  Hypertension emergency. Continue hydralazine as needed.  This may be due to severe pain.      Subjective:  Patient still complaining of severe pain in the lower back, worse with any movement.  She also has some sciatica.  But no incontinence of urine or bowel. No short of breath or cough. She is nauseous, poor appetite.  Physical Exam: Vitals:   04/26/21 2216 04/27/21 0423 04/27/21 0834 04/27/21 0933  BP: (!) 189/78 (!) 198/83 (!) 214/87 (!) 159/57  Pulse: 72 75 82 (!) 112  Resp: 18 20 20    Temp: 98.4 F (36.9 C)  98.5 F (36.9 C) (!) 97.5 F (36.4 C)   TempSrc: Oral Oral    SpO2: 99% 99% 100%   Weight:      Height:       General exam: Appears calm and comfortable  Respiratory system: Clear to auscultation. Respiratory effort normal. Cardiovascular system: S1 & S2 heard, RRR. No JVD, murmurs, rubs, gallops or clicks. No pedal edema. Gastrointestinal system: Abdomen is nondistended, soft and nontender. No organomegaly or masses felt. Normal bowel sounds heard. Central nervous system: Alert and oriented. No focal neurological deficits. Extremities: Symmetric 5 x 5 power. Skin: No rashes, lesions or ulcers Psychiatry: Judgement and insight appear normal. Mood & affect appropriate.  Tenderness in low back with muscle spasm  Data Reviewed: Reviewed of the lumbar spine, reviewed all labs.  Family Communication: Updated husband at the bedside, all questions answered.  Disposition: Status is: Observation The patient will require care spanning > 2 midnights and should be moved to inpatient because: Severity of disease, requiring IV fluid and IV pain medicine.        Planned Discharge Destination: Home with Home Health     Time spent: 34 minutes, more than 50% of time involved in direct patient care.  Author: Sharen Hones, MD 04/27/2021 10:29 AM  For on call review [email or web address].

## 2021-04-27 NOTE — Progress Notes (Signed)
Patient called out at 0410 c/o 10/10 back pain and was shaking and crying from discomfort upon assessment. Next dose of scheduled pain medication not available until 0445. Notified NP Foust, see new orders.   Earleen Reaper, RN

## 2021-04-28 DIAGNOSIS — M5441 Lumbago with sciatica, right side: Secondary | ICD-10-CM | POA: Diagnosis not present

## 2021-04-28 DIAGNOSIS — M48061 Spinal stenosis, lumbar region without neurogenic claudication: Secondary | ICD-10-CM | POA: Diagnosis not present

## 2021-04-28 DIAGNOSIS — I160 Hypertensive urgency: Secondary | ICD-10-CM | POA: Diagnosis not present

## 2021-04-28 LAB — CBC WITH DIFFERENTIAL/PLATELET
Abs Immature Granulocytes: 0.06 10*3/uL (ref 0.00–0.07)
Basophils Absolute: 0 10*3/uL (ref 0.0–0.1)
Basophils Relative: 0 %
Eosinophils Absolute: 0 10*3/uL (ref 0.0–0.5)
Eosinophils Relative: 0 %
HCT: 39.7 % (ref 36.0–46.0)
Hemoglobin: 12.9 g/dL (ref 12.0–15.0)
Immature Granulocytes: 1 %
Lymphocytes Relative: 25 %
Lymphs Abs: 2.8 10*3/uL (ref 0.7–4.0)
MCH: 28.9 pg (ref 26.0–34.0)
MCHC: 32.5 g/dL (ref 30.0–36.0)
MCV: 89 fL (ref 80.0–100.0)
Monocytes Absolute: 0.9 10*3/uL (ref 0.1–1.0)
Monocytes Relative: 8 %
Neutro Abs: 7.7 10*3/uL (ref 1.7–7.7)
Neutrophils Relative %: 66 %
Platelets: 272 10*3/uL (ref 150–400)
RBC: 4.46 MIL/uL (ref 3.87–5.11)
RDW: 13.2 % (ref 11.5–15.5)
WBC: 11.5 10*3/uL — ABNORMAL HIGH (ref 4.0–10.5)
nRBC: 0 % (ref 0.0–0.2)

## 2021-04-28 LAB — MAGNESIUM: Magnesium: 1.6 mg/dL — ABNORMAL LOW (ref 1.7–2.4)

## 2021-04-28 LAB — BASIC METABOLIC PANEL
Anion gap: 9 (ref 5–15)
BUN: 35 mg/dL — ABNORMAL HIGH (ref 8–23)
CO2: 24 mmol/L (ref 22–32)
Calcium: 9.2 mg/dL (ref 8.9–10.3)
Chloride: 106 mmol/L (ref 98–111)
Creatinine, Ser: 0.71 mg/dL (ref 0.44–1.00)
GFR, Estimated: >60 mL/min (ref >60)
Glucose, Bld: 118 mg/dL — ABNORMAL HIGH (ref 70–99)
Potassium: 3.6 mmol/L (ref 3.5–5.1)
Sodium: 139 mmol/L (ref 135–145)

## 2021-04-28 MED ORDER — POLYETHYLENE GLYCOL 3350 17 G PO PACK
17.0000 g | PACK | Freq: Every day | ORAL | Status: DC
  Administered 2021-04-29 – 2021-05-05 (×6): 17 g via ORAL
  Filled 2021-04-28 (×8): qty 1

## 2021-04-28 MED ORDER — HYDROMORPHONE HCL 1 MG/ML IJ SOLN
0.5000 mg | INTRAMUSCULAR | Status: DC | PRN
  Administered 2021-04-28 – 2021-04-29 (×5): 0.5 mg via INTRAVENOUS
  Filled 2021-04-28 (×5): qty 1

## 2021-04-28 MED ORDER — CELECOXIB 200 MG PO CAPS
200.0000 mg | ORAL_CAPSULE | Freq: Every day | ORAL | Status: DC
  Administered 2021-04-28 – 2021-04-29 (×2): 200 mg via ORAL
  Filled 2021-04-28 (×2): qty 1

## 2021-04-28 MED ORDER — BISACODYL 10 MG RE SUPP
10.0000 mg | Freq: Once | RECTAL | Status: AC
  Administered 2021-04-28: 10 mg via RECTAL
  Filled 2021-04-28: qty 1

## 2021-04-28 NOTE — Progress Notes (Signed)
°  Progress Note   Patient: Sherry [name] IWP:[id number] DOB: [birth year] DOA: 04/26/2021     1 DOS: the patient was seen and examined on 04/28/2021   Brief hospital course: Sherry [name] is a 88 y.o. female with history of hypertension presents to the ER with complaints of increasing back pain and left ankle pain.  CT scan showed spinal stenosis at the L4-L5 and L5-S1.  Started on pain medicine  Assessment and Plan: Intractable lower back pain. Spinal stenosis at L5-S1 and L4-L5. MRI lumbar spine showed no acute changes, with spinal stenosis as above. .  Patient has a chronic spinal stenosis, was doing well at home.  She had some ankle pain on Tuesday, started have severe back pain on Friday.  This most likely secondary to muscle strain as patient was not able to walk properly due to ankle pain. Currently, patient does not have incontinence of urine, pain is worse with movement.  Patient still has severe pain today, she was tolerating IV pain medicine and oral pain medicine.  I will increase Dilaudid to 0.5 milligram as needed, continue oral as needed pain medicine.  Continue IV steroids with Solu-Medrol 40 mg daily for muscle inflammation. Will obtain neurosurgery consult per family request. Continue senna for anticipated constipation   Hypertension emergency. Continue hydralazine as needed.  This is due to severe pain.      Subjective:  Patient pain was better yesterday for 6 hours after given 0.25 mg IV Dilaudid.  But back pain came back again afterwards. Still has a poor appetite without nausea vomiting. No incontinence of urine or or stool. No fever or chills.  Physical Exam: Vitals:   04/28/21 0421 04/28/21 0453 04/28/21 0542 04/28/21 0758  BP: (!) 196/79 (!) 195/79 (!) 180/53 (!) 157/52  Pulse: 74 73 97 84  Resp:    15  Temp:    97.9 F (36.6 C)  TempSrc:      SpO2: 100% 100% 100% 100%  Weight:      Height:       General exam: Appears uncomfortable due to  pain. Respiratory system: Clear to auscultation. Respiratory effort normal. Cardiovascular system: S1 & S2 heard, RRR. No JVD, murmurs, rubs, gallops or clicks. No pedal edema. Gastrointestinal system: Abdomen is nondistended, soft and nontender. No organomegaly or masses felt. Normal bowel sounds heard. Central nervous system: Alert and oriented. No focal neurological deficits. Extremities: Symmetric 5 x 5 power. Skin: No rashes, lesions or ulcers Psychiatry: Judgement and insight appear normal. Mood & affect appropriate.    Data Reviewed: Reviewed all lab results  Family Communication: Husband at the bedside updated.  Disposition: Status is: Inpatient Remains inpatient appropriate because: Severity disease.  Requiring IV pain medicine and IV fluids.          Planned Discharge Destination: Skilled nursing facility     Time spent: 28 minutes  Author: Sharen Hones, MD 04/28/2021 12:36 PM  For on call review [email or web address].

## 2021-04-28 NOTE — Progress Notes (Signed)
PT Cancellation Note  Patient Details Name: KATIANA RULAND MRN: [id number] DOB: 03-Jun-1930   Cancelled Treatment:    Reason Eval/Treat Not Completed: Other (comment): Per MD request PT services to be held this date secondary to high pain level with pt unable to tolerate PT at this time. Will attempt to see pt at a future date/time as medically appropriate.     Linus Salmons PT, DPT 04/28/21, 9:26 AM

## 2021-04-28 NOTE — Consult Note (Addendum)
Neurosurgery-New Consultation Evaluation 04/28/2021 AMERE IOTT [id number]  Identifying Statement: TEANDRA [name] is a 88 y.o. female from [place] [address] with a history of HTN.  Physician Requesting Consultation: No ref. provider found  History of Present Illness: Sherry [name] is an 88 y.o female with a 5-7 day onset of back and left ankle pain. She states this started early last week without any inciting event.  She was initially seen by her primary care provider and given pain medication however her symptoms have progressively worsened since prompting an ER visit on Monday.  At present she has left-sided low back pain that radiates into her left hip and buttock.  To her knowledge it seems to skip the majority of her left leg and she has severe left lateral ankle pain.  She states the pain is constant and severe in nature but cannot give specific description as whether or not it is burning, dull, or sharp. She states that it is worse with activity and in some positions although she cannot specify which positions seem to make it worse. She reports some relief with the medication that she was given in the hospital this morning although she is not sure what this medication was.  She denies any similar right-sided symptoms.  Past Medical History:  Past Medical History:  Diagnosis Date   Actinic keratosis    Hypertension     Social History: Social History   Socioeconomic History   Marital status: Married    Spouse name: Not on file   Number of children: Not on file   Years of education: Not on file   Highest education level: Not on file  Occupational History   Not on file  Tobacco Use   Smoking status: Never   Smokeless tobacco: Never  Substance and Sexual Activity   Alcohol use: Yes    Comment: Occassionally   Drug use: No   Sexual activity: Not on file  Other Topics Concern   Not on file  Social History Narrative   Not on file   Social Determinants of Health    Financial Resource Strain: Not on file  Food Insecurity: Not on file  Transportation Needs: Not on file  Physical Activity: Not on file  Stress: Not on file  Social Connections: Not on file  Intimate Partner Violence: Not on file    Family History: Family History  Problem Relation Age of Onset   Hypertension Father     Review of Systems:  Review of Systems - General ROS: Negative Psychological ROS: Negative Ophthalmic ROS: Negative ENT ROS: Negative Hematological and Lymphatic ROS: Negative  Endocrine ROS: Negative Respiratory ROS: Negative Cardiovascular ROS: Negative Gastrointestinal ROS: Negative Genito-Urinary ROS: Negative Musculoskeletal ROS: Negative Neurological ROS: Negative Dermatological ROS: Negative  Physical Exam: BP (!) 178/80 (BP Location: Right Arm)    Pulse 73    Temp 98.1 F (36.7 C)    Resp 16    Ht 5\' 3"  (1.6 m)    Wt 63.6 kg    SpO2 100%    BMI 24.84 kg/m  Body mass index is 24.84 kg/m. Body surface area is 1.68 meters squared. General appearance: Alert, cooperative, in mild distress 2/2 to pain  Head: Normocephalic, atraumatic Eyes: Normal, EOM intact Oropharynx: Moist without lesions Neck:  no tenderness Lungs: good air exchange Abdomen: Soft, nondistended Ext: No edema in LE bilaterally, good distal pulses MSK: no TTP of knee or ankles   Neurologic exam:  Mental status: alertness: alert, orientation: person,  place, time, affect: normal Speech: fluent and clear Cranial nerves:  CN II-XII grossly intact  Motor:strength symmetric 5/5, normal muscle mass and tone in all extremities and no pronator drift Sensory: intact to light touch in all extremities Reflexes: 2+ and symmetric bilaterally for arms and legs Gait: untested   Laboratory: Results for orders placed or performed during the hospital encounter of 04/26/21  Resp Panel by RT-PCR (Flu A&B, Covid) Nasopharyngeal Swab   Specimen: Nasopharyngeal Swab; Nasopharyngeal(NP) swabs in  vial transport medium  Result Value Ref Range   SARS Coronavirus 2 by RT PCR NEGATIVE NEGATIVE   Influenza A by PCR NEGATIVE NEGATIVE   Influenza B by PCR NEGATIVE NEGATIVE  CBC  Result Value Ref Range   WBC 8.1 4.0 - 10.5 K/uL   RBC 4.69 3.87 - 5.11 MIL/uL   Hemoglobin 13.6 12.0 - 15.0 g/dL   HCT 41.9 36.0 - 46.0 %   MCV 89.3 80.0 - 100.0 fL   MCH 29.0 26.0 - 34.0 pg   MCHC 32.5 30.0 - 36.0 g/dL   RDW 12.9 11.5 - 15.5 %   Platelets 275 150 - 400 K/uL   nRBC 0.0 0.0 - 0.2 %  Comprehensive metabolic panel  Result Value Ref Range   Sodium 139 135 - 145 mmol/L   Potassium 3.5 3.5 - 5.1 mmol/L   Chloride 99 98 - 111 mmol/L   CO2 26 22 - 32 mmol/L   Glucose, Bld 139 (H) 70 - 99 mg/dL   BUN 32 (H) 8 - 23 mg/dL   Creatinine, Ser 0.72 0.44 - 1.00 mg/dL   Calcium 9.9 8.9 - 10.3 mg/dL   Total Protein 6.5 6.5 - 8.1 g/dL   Albumin 3.8 3.5 - 5.0 g/dL   AST 24 15 - 41 U/L   ALT 16 0 - 44 U/L   Alkaline Phosphatase 50 38 - 126 U/L   Total Bilirubin 0.7 0.3 - 1.2 mg/dL   GFR, Estimated >60 >60 mL/min   Anion gap 14 5 - 15  Basic metabolic panel  Result Value Ref Range   Sodium 142 135 - 145 mmol/L   Potassium 4.4 3.5 - 5.1 mmol/L   Chloride 101 98 - 111 mmol/L   CO2 27 22 - 32 mmol/L   Glucose, Bld 106 (H) 70 - 99 mg/dL   BUN 36 (H) 8 - 23 mg/dL   Creatinine, Ser 0.71 0.44 - 1.00 mg/dL   Calcium 9.4 8.9 - 10.3 mg/dL   GFR, Estimated >60 >60 mL/min   Anion gap 14 5 - 15  CBC  Result Value Ref Range   WBC 9.5 4.0 - 10.5 K/uL   RBC 4.66 3.87 - 5.11 MIL/uL   Hemoglobin 13.7 12.0 - 15.0 g/dL   HCT 41.2 36.0 - 46.0 %   MCV 88.4 80.0 - 100.0 fL   MCH 29.4 26.0 - 34.0 pg   MCHC 33.3 30.0 - 36.0 g/dL   RDW 13.0 11.5 - 15.5 %   Platelets 270 150 - 400 K/uL   nRBC 0.0 0.0 - 0.2 %  CBC with Differential/Platelet  Result Value Ref Range   WBC 11.5 (H) 4.0 - 10.5 K/uL   RBC 4.46 3.87 - 5.11 MIL/uL   Hemoglobin 12.9 12.0 - 15.0 g/dL   HCT 39.7 36.0 - 46.0 %   MCV 89.0 80.0 - 100.0  fL   MCH 28.9 26.0 - 34.0 pg   MCHC 32.5 30.0 - 36.0 g/dL   RDW 13.2 11.5 -  15.5 %   Platelets 272 150 - 400 K/uL   nRBC 0.0 0.0 - 0.2 %   Neutrophils Relative % 66 %   Neutro Abs 7.7 1.7 - 7.7 K/uL   Lymphocytes Relative 25 %   Lymphs Abs 2.8 0.7 - 4.0 K/uL   Monocytes Relative 8 %   Monocytes Absolute 0.9 0.1 - 1.0 K/uL   Eosinophils Relative 0 %   Eosinophils Absolute 0.0 0.0 - 0.5 K/uL   Basophils Relative 0 %   Basophils Absolute 0.0 0.0 - 0.1 K/uL   Immature Granulocytes 1 %   Abs Immature Granulocytes 0.06 0.00 - 0.07 K/uL  Basic metabolic panel  Result Value Ref Range   Sodium 139 135 - 145 mmol/L   Potassium 3.6 3.5 - 5.1 mmol/L   Chloride 106 98 - 111 mmol/L   CO2 24 22 - 32 mmol/L   Glucose, Bld 118 (H) 70 - 99 mg/dL   BUN 35 (H) 8 - 23 mg/dL   Creatinine, Ser 0.71 0.44 - 1.00 mg/dL   Calcium 9.2 8.9 - 10.3 mg/dL   GFR, Estimated >60 >60 mL/min   Anion gap 9 5 - 15  Magnesium  Result Value Ref Range   Magnesium 1.6 (L) 1.7 - 2.4 mg/dL   I personally reviewed labs  Imaging: MRI L-spine 04/26/21   FINDINGS: Motion artifact is present.   Segmentation: Standard.   Alignment: Grade 1 anterolisthesis at L5-S1 and L4-L5. Retrolisthesis at L2-L3 and L3-L4.   Vertebrae: Degenerative endplate irregularity. Lumbar vertebral body heights are otherwise maintained. Chronic appearing mild T10 compression fracture. Minor degenerative endplate marrow edema. Mild marrow edema associated with right L3-L4 facet arthropathy. No suspicious osseous lesion.   Conus medullaris and cauda equina: Conus extends to the superior L2 level. Conus and cauda equina appear normal.   Paraspinal and other soft tissues: Unremarkable.   Disc levels:   T12-L1: Disc bulge eccentric to the right. Mild facet arthropathy. No significant canal or foraminal stenosis.   L1-L2: Disc bulge with endplate osteophytic ridging. Mild facet arthropathy. No significant canal or foraminal  stenosis.   L2-L3: Disc bulge with endplate osteophytic ridging. Mild facet arthropathy with ligamentum flavum infolding. No significant canal stenosis. Mild right and moderate left foraminal stenosis.   L3-L4: Disc bulge with endplate osteophytic ridging. Mild facet arthropathy with ligamentum flavum infolding. No significant canal stenosis. Mild right and mild to moderate left foraminal stenosis.   L4-L5: Disc bulge with superimposed right foraminal protrusion. Marked facet arthropathy with ligamentum flavum infolding. Moderate canal stenosis. Narrowing of the right greater than left subarticular recesses. Moderate to marked right and mild left foraminal stenosis.   L5-S1: Disc bulge. Marked facet arthropathy with ligamentum flavum infolding. Moderate canal stenosis. Narrowing of the subarticular recesses. Mild to moderate foraminal stenosis, greater on the right.   IMPRESSION: Multilevel degenerative changes as detailed above. Canal and subarticular recess stenosis are greatest at L4-L5 and L5-S1. Foraminal stenosis is greatest on the right at L4-L5. Facet arthropathy is greatest at L4-L5.     Electronically Signed   By: Macy Mis M.D.   On: 04/26/2021 14:57  I personally reviewed radiology studies to include:   Impression/Plan:     1.  Diagnosis: Lumbosacral spondylosis  2.  Plan - continue with pain management per primary team - no plan for acute neurosurgical intervention.  - would recommend f/u with physiatry for consideration of injections outpatient.  - please call with any questions or concerns.  Cooper Render  PA-C Neurosurgery

## 2021-04-28 NOTE — Plan of Care (Signed)

## 2021-04-29 ENCOUNTER — Inpatient Hospital Stay: Payer: Medicare Other | Admitting: Radiology

## 2021-04-29 HISTORY — PX: IR INJECT/THERA/INC NEEDLE/CATH/PLC EPI/LUMB/SAC W/IMG: IMG6130

## 2021-04-29 LAB — CBC WITH DIFFERENTIAL/PLATELET
Abs Immature Granulocytes: 0.07 10*3/uL (ref 0.00–0.07)
Basophils Absolute: 0 10*3/uL (ref 0.0–0.1)
Basophils Relative: 0 %
Eosinophils Absolute: 0 10*3/uL (ref 0.0–0.5)
Eosinophils Relative: 0 %
HCT: 37.4 % (ref 36.0–46.0)
Hemoglobin: 12.1 g/dL (ref 12.0–15.0)
Immature Granulocytes: 1 %
Lymphocytes Relative: 22 %
Lymphs Abs: 2.3 10*3/uL (ref 0.7–4.0)
MCH: 29.1 pg (ref 26.0–34.0)
MCHC: 32.4 g/dL (ref 30.0–36.0)
MCV: 89.9 fL (ref 80.0–100.0)
Monocytes Absolute: 0.9 10*3/uL (ref 0.1–1.0)
Monocytes Relative: 9 %
Neutro Abs: 7.2 10*3/uL (ref 1.7–7.7)
Neutrophils Relative %: 68 %
Platelets: 266 10*3/uL (ref 150–400)
RBC: 4.16 MIL/uL (ref 3.87–5.11)
RDW: 13.3 % (ref 11.5–15.5)
WBC: 10.5 10*3/uL (ref 4.0–10.5)
nRBC: 0 % (ref 0.0–0.2)

## 2021-04-29 LAB — BASIC METABOLIC PANEL
Anion gap: 7 (ref 5–15)
BUN: 30 mg/dL — ABNORMAL HIGH (ref 8–23)
CO2: 26 mmol/L (ref 22–32)
Calcium: 9.1 mg/dL (ref 8.9–10.3)
Chloride: 106 mmol/L (ref 98–111)
Creatinine, Ser: 0.69 mg/dL (ref 0.44–1.00)
GFR, Estimated: >60 mL/min (ref >60)
Glucose, Bld: 106 mg/dL — ABNORMAL HIGH (ref 70–99)
Potassium: 3.8 mmol/L (ref 3.5–5.1)
Sodium: 139 mmol/L (ref 135–145)

## 2021-04-29 LAB — MAGNESIUM: Magnesium: 1.7 mg/dL (ref 1.7–2.4)

## 2021-04-29 MED ORDER — LIDOCAINE HCL (PF) 1 % IJ SOLN
INTRAMUSCULAR | Status: AC
  Filled 2021-04-29: qty 30

## 2021-04-29 MED ORDER — KETOROLAC TROMETHAMINE 15 MG/ML IJ SOLN
15.0000 mg | Freq: Four times a day (QID) | INTRAMUSCULAR | Status: DC
  Administered 2021-04-29 – 2021-05-02 (×6): 15 mg via INTRAVENOUS
  Filled 2021-04-29 (×7): qty 1

## 2021-04-29 MED ORDER — ACETAMINOPHEN 500 MG PO TABS
1000.0000 mg | ORAL_TABLET | Freq: Three times a day (TID) | ORAL | Status: DC
  Administered 2021-04-29 – 2021-05-06 (×19): 1000 mg via ORAL
  Filled 2021-04-29 (×19): qty 2

## 2021-04-29 MED ORDER — OXYCODONE HCL 5 MG PO TABS
5.0000 mg | ORAL_TABLET | ORAL | Status: DC | PRN
  Administered 2021-04-30 – 2021-05-01 (×2): 5 mg via ORAL
  Filled 2021-04-29 (×2): qty 1

## 2021-04-29 MED ORDER — HYDRALAZINE HCL 25 MG PO TABS
25.0000 mg | ORAL_TABLET | ORAL | Status: DC | PRN
Start: 2021-04-29 — End: 2021-05-06
  Administered 2021-04-29 – 2021-05-01 (×3): 25 mg via ORAL
  Filled 2021-04-29 (×3): qty 1

## 2021-04-29 MED ORDER — HYDROMORPHONE HCL 1 MG/ML IJ SOLN
0.5000 mg | INTRAMUSCULAR | Status: DC | PRN
Start: 2021-04-29 — End: 2021-04-30

## 2021-04-29 MED ORDER — METHYLPREDNISOLONE ACETATE 80 MG/ML IJ SUSP
INTRAMUSCULAR | Status: AC
  Administered 2021-04-29: 80 mg via INTRATHECAL
  Filled 2021-04-29: qty 1

## 2021-04-29 MED ORDER — IOHEXOL 180 MG/ML  SOLN
1.0000 mL | Freq: Once | INTRAMUSCULAR | Status: AC | PRN
  Administered 2021-04-29: 1 mL via INTRATHECAL

## 2021-04-29 MED ORDER — LABETALOL HCL 5 MG/ML IV SOLN
10.0000 mg | INTRAVENOUS | Status: DC | PRN
  Administered 2021-04-30 – 2021-05-02 (×3): 10 mg via INTRAVENOUS
  Filled 2021-04-29 (×5): qty 4

## 2021-04-29 NOTE — Progress Notes (Signed)
Physical Therapy Treatment ?Patient Details ?Name: Sherry [name] ?MRN: [id number] ?DOB: January 15, 1931 ?Today's Date: 04/29/2021 ? ? ?History of Present Illness Pt is an 88 y.o. female with history of hypertension presents to the ER with complaints of increasing back pain and left ankle pain.  The pain is radiating from the low back all the way to the ankle the pain is mostly in the left ankle.  Denies any trauma or fall.  Patient has been having this pain for almost a week. MD assessment includes:Intractable lower back pain, spinal stenosis at L5-S1 and L4-L5, and hypertensive urgency. ? ?  ?PT Comments  ? ? Pt declined all mobility this session secondary to back pain and fear of pain becoming worse with mobility. Pt education provided on physiological benefits of activity but pt continued to decline getting up from bed.  Pt agreed to supine therex only per below and tolerated well with no significant increase in back pain from 5/10 at rest.  Pt will benefit from PT services in a SNF setting upon discharge to safely address deficits listed in patient problem list for decreased caregiver assistance and eventual return to PLOF. ?  ?Recommendations for follow up therapy are one component of a multi-disciplinary discharge planning process, led by the attending physician.  Recommendations may be updated based on patient status, additional functional criteria and insurance authorization. ? ?Follow Up Recommendations ? Skilled nursing-short term rehab (<3 hours/day) ?  ?  ?Assistance Recommended at Discharge Frequent or constant Supervision/Assistance  ?Patient can return home with the following Two people to help with walking and/or transfers;Two people to help with bathing/dressing/bathroom;Assistance with cooking/housework;Direct supervision/assist for medications management;Assist for transportation ?  ?Equipment Recommendations ? Other (comment) (TBD at next venue of care)  ?  ?Recommendations for Other Services   ? ? ?   ?Precautions / Restrictions Precautions ?Precautions: Fall ?Restrictions ?Weight Bearing Restrictions: No  ?  ? ?Mobility ? Bed Mobility ?  ?  ?  ?  ?  ?  ?  ?General bed mobility comments: Pt declined all but supine therex secondary to back pain ?  ? ?Transfers ?  ?  ?  ?  ?  ?  ?  ?  ?  ?  ?  ? ?Ambulation/Gait ?  ?  ?  ?  ?  ?  ?  ?  ? ? ?Stairs ?  ?  ?  ?  ?  ? ? ?Wheelchair Mobility ?  ? ?Modified Rankin (Stroke Patients Only) ?  ? ? ?  ?Balance   ?  ?  ?  ?  ?  ?  ?  ?  ?  ?  ?  ?  ?  ?  ?  ?  ?  ?  ?  ? ?  ?Cognition Arousal/Alertness: Awake/alert ?Behavior During Therapy: [facility] for tasks assessed/performed ?Overall Cognitive Status: Within Functional Limits for tasks assessed ?  ?  ?  ?  ?  ?  ?  ?  ?  ?  ?  ?  ?  ?  ?  ?  ?  ?  ?  ? ?  ?Exercises Total Joint Exercises ?Ankle Circles/Pumps: Strengthening, Both, 10 reps, 15 reps ?Quad Sets: Strengthening, Both, 10 reps, 15 reps ?Gluteal Sets: Strengthening, Both, 10 reps ?Towel Squeeze: Strengthening, Both, 10 reps ?Short Arc Quad: Strengthening, Both, 10 reps ?Heel Slides: AAROM, Strengthening, Both, 5 reps ?Hip ABduction/ADduction: AAROM, Strengthening, Both, 5 reps ?Straight Leg Raises: AAROM, Strengthening, Both, 5 reps ?  Other Exercises ?Other Exercises: Pt education provided on physiological benefits of activity ?Other Exercises: HEP education/reveiew for BLE APs, QS, GS, and LAQs x 10 each to 3-4x/day to pt's tolerance ? ?  ?General Comments   ?  ?  ? ?Pertinent Vitals/Pain Pain Assessment ?Pain Assessment: 0-10 ?Pain Score: 5  ?Pain Location: low back down LLE ?Pain Descriptors / Indicators: Constant ?Pain Intervention(s): Premedicated before session, Monitored during session  ? ? ?Home Living   ?  ?  ?  ?  ?  ?  ?  ?  ?  ?   ?  ?Prior Function    ?  ?  ?   ? ?PT Goals (current goals can now be found in the care plan section) Progress towards PT goals: Not progressing toward goals - comment (limited by pain) ? ?  ?Frequency ? ? ? Min 2X/week ? ? ? ?   ?PT Plan Current plan remains appropriate  ? ? ?Co-evaluation   ?  ?  ?  ?  ? ?  ?AM-PAC PT "6 Clicks" Mobility   ?Outcome Measure ? Help needed turning from your back to your side while in a flat bed without using bedrails?: A Lot ?Help needed moving from lying on your back to sitting on the side of a flat bed without using bedrails?: A Lot ?Help needed moving to and from a bed to a chair (including a wheelchair)?: A Lot ?Help needed standing up from a chair using your arms (e.g., wheelchair or bedside chair)?: A Lot ?Help needed to walk in hospital room?: Total ?Help needed climbing 3-5 steps with a railing? : Total ?6 Click Score: 10 ? ?  ?End of Session   ?Activity Tolerance: Patient limited by pain ?Patient left: in bed;with call bell/phone within reach;with bed alarm set ?Nurse Communication: Mobility status ?PT Visit Diagnosis: Difficulty in walking, not elsewhere classified (R26.2);Muscle weakness (generalized) (M62.81);Pain ?Pain - Right/Left: Left ?Pain - part of body: Leg;Ankle and joints of foot;Hip (low back) ?  ? ? ?Time: 0109-3235 ?PT Time Calculation (min) (ACUTE ONLY): 15 min ? ?Charges:  $Therapeutic Exercise: 8-22 mins          ?          ?D. Royetta Asal PT, DPT ?04/29/21, 3:25 PM ? ? ?

## 2021-04-29 NOTE — Progress Notes (Signed)
?Progress Note ? ? ?Patient: Sherry [name] NIO:[id number] DOB: April 28, 1930 DOA: 04/26/2021     2 ?DOS: the patient was seen and examined on 04/29/2021 ?  ?Brief hospital course: ?Sherry [name] is a 88 y.o. female with history of HTN who presented to the ER with worsening low back pain and left ankle pan.  ?She endorsed this being severe pain outside normal for her.  She had endorsed walking longer than usual recently prior to admission but otherwise denied any obvious traumas or other injuries or overexertion. ?MRI lumbar spine showed multilevel degenerative changes notably subarticular recess stenosis at L4-L5 and L5-S1;  ?Foraminal stenosis greatest at L4-5 and facet arthropathy greatest at L4-5. ?She was also evaluated by neurosurgery on admission and recommended for ongoing nonsurgical management notably with consideration for outpatient injections and continuing pain control and working with physical therapy while hospitalized. ? ?Assessment and Plan: ?Spinal stenosis at L4-L5 level ?- s/p possible overexertion at home with increased amount of activity she was not used to; MRI L spine shows multilevel degenerative changes with stenosis particularly noted involving L4-L5 and L5-S1 ?-Patient having significant difficulty with pain control during hospitalization which is also hindering her ability to work with physical therapy ?-No surgical intervention recommended per neurosurgery ?-IR consulted for consideration of steroid injection ?- will modify pain regimen further today  ? ?Hypertensive urgency- (present on admission) ?- Likely due to uncontrolled pain.  Pressure typically controlled at home per patient ? ? ? ? ? ?Subjective: Laying in bed when seen today in significant amount of pain still.  Has been unable to do much with physical therapy due to her pain. ? ?Physical Exam: ?Vitals:  ? 04/29/21 0423 04/29/21 0600 04/29/21 0729 04/29/21 1135  ?BP: (!) 181/69 (!) 188/71 (!) 161/51 (!) 146/53  ?Pulse: 75  76 84 80  ?Resp: 16  18 15   ?Temp: 97.8 ?F (36.6 ?C)  97.7 ?F (36.5 ?C) 98.3 ?F (36.8 ?C)  ?TempSrc:      ?SpO2: 100%  99% 100%  ?Weight:      ?Height:      ? ?Physical Exam ?Constitutional:   ?   Comments: No obvious distress but certainly appears uncomfortable  ?HENT:  ?   Head: Normocephalic and atraumatic.  ?   Mouth/Throat:  ?   Mouth: Mucous membranes are moist.  ?Eyes:  ?   Extraocular Movements: Extraocular movements intact.  ?Cardiovascular:  ?   Rate and Rhythm: Normal rate and regular rhythm.  ?Pulmonary:  ?   Effort: Pulmonary effort is normal.  ?   Breath sounds: Normal breath sounds.  ?Abdominal:  ?   General: Bowel sounds are normal. There is no distension.  ?   Palpations: Abdomen is soft.  ?   Tenderness: There is no abdominal tenderness.  ?Musculoskeletal:  ?   Cervical back: Normal range of motion and neck supple.  ?   Comments: Decreased active range of motion in lower extremities due to pain  ?Skin: ?   General: Skin is warm and dry.  ?Neurological:  ?   Mental Status: She is alert.  ?   Comments: Subjective paresthesia mildly noted in left lower extremity  ?Psychiatric:     ?   Mood and Affect: Mood normal.     ?   Behavior: Behavior normal.  ? ? ? ?Data Reviewed: ?Results for orders placed or performed during the hospital encounter of 04/26/21 (from the past 24 hour(s))  ?CBC with Differential/Platelet  Status: None  ? Collection Time: 04/29/21  4:17 AM  ?Result Value Ref Range  ? WBC 10.5 4.0 - 10.5 K/uL  ? RBC 4.16 3.87 - 5.11 MIL/uL  ? Hemoglobin 12.1 12.0 - 15.0 g/dL  ? HCT 37.4 36.0 - 46.0 %  ? MCV 89.9 80.0 - 100.0 fL  ? MCH 29.1 26.0 - 34.0 pg  ? MCHC 32.4 30.0 - 36.0 g/dL  ? RDW 13.3 11.5 - 15.5 %  ? Platelets 266 150 - 400 K/uL  ? nRBC 0.0 0.0 - 0.2 %  ? Neutrophils Relative % 68 %  ? Neutro Abs 7.2 1.7 - 7.7 K/uL  ? Lymphocytes Relative 22 %  ? Lymphs Abs 2.3 0.7 - 4.0 K/uL  ? Monocytes Relative 9 %  ? Monocytes Absolute 0.9 0.1 - 1.0 K/uL  ? Eosinophils Relative 0 %  ?  Eosinophils Absolute 0.0 0.0 - 0.5 K/uL  ? Basophils Relative 0 %  ? Basophils Absolute 0.0 0.0 - 0.1 K/uL  ? Immature Granulocytes 1 %  ? Abs Immature Granulocytes 0.07 0.00 - 0.07 K/uL  ?Basic metabolic panel     Status: Abnormal  ? Collection Time: 04/29/21  4:17 AM  ?Result Value Ref Range  ? Sodium 139 135 - 145 mmol/L  ? Potassium 3.8 3.5 - 5.1 mmol/L  ? Chloride 106 98 - 111 mmol/L  ? CO2 26 22 - 32 mmol/L  ? Glucose, Bld 106 (H) 70 - 99 mg/dL  ? BUN 30 (H) 8 - 23 mg/dL  ? Creatinine, Ser 0.69 0.44 - 1.00 mg/dL  ? Calcium 9.1 8.9 - 10.3 mg/dL  ? GFR, Estimated >60 >60 mL/min  ? Anion gap 7 5 - 15  ?Magnesium     Status: None  ? Collection Time: 04/29/21  4:17 AM  ?Result Value Ref Range  ? Magnesium 1.7 1.7 - 2.4 mg/dL  ?  ?I have Reviewed nursing notes, Vitals, and Lab results since pt's last encounter. Pertinent lab results : see above ?I have ordered test including BMP, CBC, Mg ?I have reviewed the last note from staff over past 24 hours ?I have discussed pt's care plan and test results with nursing staff, case manager ? ? ?Disposition: ?Status is: Inpatient ?Remains inpatient appropriate because: Treatment as outlined in A&P ? ? ? Planned Discharge Destination: Home ? ?Antimicrobials: ? ? ?Consultants: ?IR ? ?Procedures:  ? ? ?DVT ppx:  ?enoxaparin (LOVENOX) injection 40 mg Start: 04/26/21 2200 ? ? ?  Code Status: Full Code  ? ? ?Author: ?Dwyane Dee, MD ?04/29/2021 4:32 PM ? ?For on call review [email or web address].  ? ?

## 2021-04-29 NOTE — Assessment & Plan Note (Addendum)
-   Likely due to uncontrolled pain.  Pressure typically controlled at home per patient with 1 anti-hypertensive ?-BP better controlled today after adequate pain control and more compliance with medications in general while in hospital now ?

## 2021-04-29 NOTE — Hospital Course (Signed)
Sherry [name] is a 88 y.o. female with history of HTN who presented to the ER with worsening low back pain and left ankle pan.  ?She endorsed this being severe pain outside normal for her.  She had endorsed walking longer than usual recently prior to admission but otherwise denied any obvious traumas or other injuries or overexertion. ?MRI lumbar spine showed multilevel degenerative changes notably subarticular recess stenosis at L4-L5 and L5-S1;  ?Foraminal stenosis greatest at L4-5 and facet arthropathy greatest at L4-5. ?She was also evaluated by neurosurgery on admission and recommended for ongoing nonsurgical management notably with consideration for outpatient injections and continuing pain control and working with physical therapy while hospitalized. ?

## 2021-04-29 NOTE — TOC Progression Note (Signed)
Transition of Care (TOC) - Progression Note  ? ? ?Patient Details  ?Name: Sherry [name] ?MRN: [id number] ?Date of Birth: 03/21/1930 ? ?Transition of Care (TOC) CM/SW Contact  ?Conception Oms, RN ?Phone Number: ?04/29/2021, 9:22 AM ? ?Clinical Narrative:   Patient continues to refuse to go to [facility] SNF, Husband cares for her at home,  ? ? ? ?Expected Discharge Plan: Home/Self Care ?Barriers to Discharge: Continued Medical Work up ? ?Expected Discharge Plan and Services ?Expected Discharge Plan: Home/Self Care ?  ?Discharge Planning Services: CM Consult ?  ?Living arrangements for the past 2 months: [place] ?                ?DME Arranged: N/A ?  ?  ?  ?  ?HH Arranged: Patient Refused HH, Refused SNF ?  ?  ?  ?  ? ? ?Social Determinants of Health (SDOH) Interventions ?  ? ?Readmission Risk Interventions ?No flowsheet data found. ? ?

## 2021-04-29 NOTE — Assessment & Plan Note (Addendum)
-   s/p possible overexertion at home with increased amount of activity she was not used to; MRI L spine shows multilevel degenerative changes with stenosis particularly noted involving L4-L5 and L5-S1 ?-Patient having significant difficulty with pain control during hospitalization which is also hindering her ability to work with physical therapy ?-No surgical intervention recommended per neurosurgery ?- s/p ESI to Left L5/S1 with IR on 3/1, greatly appreciated ?-Pain better controlled after discussion on 04/30/2021.  Dilaudid definitely did help ?-Patient transitioned to oral regimen on 05/02/2021 in anticipation for discharging home.  She seems to be tolerating this well, however she still is very nervous about going home and is concerned about having to come back due to uncontrolled pain.  Continual reassurance has been given and also called and spoke with her son on 05/03/2021.  For now, we will continue care in the hospital in hopes that patient does feel more comfortable going home very soon ?

## 2021-04-30 ENCOUNTER — Encounter (HOSPITAL_COMMUNITY): Payer: Self-pay | Admitting: Interventional Radiology

## 2021-04-30 DIAGNOSIS — I10 Essential (primary) hypertension: Secondary | ICD-10-CM

## 2021-04-30 MED ORDER — AMLODIPINE BESYLATE 10 MG PO TABS
10.0000 mg | ORAL_TABLET | Freq: Every day | ORAL | Status: DC
Start: 2021-04-30 — End: 2021-05-06
  Administered 2021-05-01 – 2021-05-06 (×5): 10 mg via ORAL
  Filled 2021-04-30 (×8): qty 1

## 2021-04-30 MED ORDER — HYDROMORPHONE HCL 1 MG/ML IJ SOLN
1.0000 mg | INTRAMUSCULAR | Status: DC | PRN
  Administered 2021-04-30 – 2021-05-01 (×4): 1 mg via INTRAVENOUS
  Filled 2021-04-30 (×4): qty 1

## 2021-04-30 NOTE — Assessment & Plan Note (Addendum)
-   continue losartan and amlodipine ?-Continue PRN labetalol or hydralazine as well ?-Overall, BP seems to be stabilizing as pain comes under better control; she did have significantly elevated pressure last night around 930pm which correlated when she was having a severe pain attack ?

## 2021-04-30 NOTE — Plan of Care (Signed)

## 2021-04-30 NOTE — Progress Notes (Signed)
Pt reports she is feeling a little better and just wants to rest a while. ?

## 2021-04-30 NOTE — Procedures (Signed)
Interventional Radiology Procedure Note ? ?Procedure: Left L5/S1 translaminar epidural steroid injection.  ? ?Complications: None ? ?Estimated Blood Loss: None ? ?Recommendations: ?- No strenuous activity x 48 hrs ?- Ok to DC from [employer] perspective ? ? ? ?Signed, ? ?Criselda Peaches, MD ? ? ?

## 2021-04-30 NOTE — Progress Notes (Signed)
?Progress Note ? ? ?Patient: Sherry [name] JTT:[id number] DOB: 06-Feb-1931 DOA: 04/26/2021     3 ?DOS: the patient was seen and examined on 04/30/2021 ?  ?Brief hospital course: ?Sherry [name] is a 88 y.o. female with history of HTN who presented to the ER with worsening low back pain and left ankle pan.  ?She endorsed this being severe pain outside normal for her.  She had endorsed walking longer than usual recently prior to admission but otherwise denied any obvious traumas or other injuries or overexertion. ?MRI lumbar spine showed multilevel degenerative changes notably subarticular recess stenosis at L4-L5 and L5-S1;  ?Foraminal stenosis greatest at L4-5 and facet arthropathy greatest at L4-5. ?She was also evaluated by neurosurgery on admission and recommended for ongoing nonsurgical management notably with consideration for outpatient injections and continuing pain control and working with physical therapy while hospitalized. ? ?Assessment and Plan: ?* Spinal stenosis at L4-L5 level ?- s/p possible overexertion at home with increased amount of activity she was not used to; MRI L spine shows multilevel degenerative changes with stenosis particularly noted involving L4-L5 and L5-S1 ?-Patient having significant difficulty with pain control during hospitalization which is also hindering her ability to work with physical therapy ?-No surgical intervention recommended per neurosurgery ?- s/p ESI to Left L5/S1 with IR on 3/1, greatly appreciated ?- long discussion with patient today regarding the issues with her pain control and the perceived mistrust with her care team; after we talked, she is much more open to accepting the trial of pain control which we can then modify together in efforts to make her more comfortable ?- I have made necessary adjustments to pain regimen; patient to report any adverse effects or intolerances to staff so we can document and avoid  ? ?Hypertensive urgency ?- Likely due to  uncontrolled pain.  Pressure typically controlled at home per patient with 1 anti-hypertensive ?- BP still uncontrolled today but patient has been refusing several meds including pain and BP meds ?- see discussion above; patient now open to treatment in general  ? ?HTN (hypertension) ?- continue losartan ?- added amlodipine but patient refused ?- I do believe much of this is pain driven and once we can control this better we hopefully can decrease BP regimen some again ?- can also use PRN labetalol or hydralazine  ? ? ? ? ? ?Subjective: Long discussion had in her room today as she has been essentially very skeptical of the care being provided.  She has refused several medications including pain and blood pressure medications since yesterday.  Her systolic blood pressure this morning was over 200 and she was feeling lousy.  After our conversation she was amenable for being trialed on a pain and blood pressure regimen which we will modify as needed.  She will also inform nursing staff if she is not tolerating any particular medication being given so we can document this and try to avoid it in the future. ? ?Physical Exam: ?Vitals:  ? 04/30/21 0323 04/30/21 0840 04/30/21 1207 04/30/21 1409  ?BP: (!) 157/60 (!) 160/66 (!) 202/77 (!) 145/56  ?Pulse: 73 79 75 80  ?Resp: 16 16 14    ?Temp: 98.1 ?F (36.7 ?C) 98.4 ?F (36.9 ?C) 98.7 ?F (37.1 ?C)   ?TempSrc:      ?SpO2: 98% 98% 99% 96%  ?Weight:      ?Height:      ? ?Physical Exam ?Constitutional:   ?   Comments: No obvious distress but certainly appears uncomfortable  ?  HENT:  ?   Head: Normocephalic and atraumatic.  ?   Mouth/Throat:  ?   Mouth: Mucous membranes are moist.  ?Eyes:  ?   Extraocular Movements: Extraocular movements intact.  ?Cardiovascular:  ?   Rate and Rhythm: Normal rate and regular rhythm.  ?Pulmonary:  ?   Effort: Pulmonary effort is normal.  ?   Breath sounds: Normal breath sounds.  ?Abdominal:  ?   General: Bowel sounds are normal. There is no distension.   ?   Palpations: Abdomen is soft.  ?   Tenderness: There is no abdominal tenderness.  ?Musculoskeletal:  ?   Cervical back: Normal range of motion and neck supple.  ?   Comments: Decreased active range of motion in lower extremities due to pain  ?Skin: ?   General: Skin is warm and dry.  ?Neurological:  ?   Mental Status: She is alert.  ?   Comments: Subjective paresthesia mildly noted in left lower extremity  ?Psychiatric:     ?   Mood and Affect: Mood normal.     ?   Behavior: Behavior normal.  ? ? ? ?Data Reviewed: ?No results found for this or any previous visit (from the past 24 hour(s)). ?  ?I have Reviewed nursing notes, Vitals, and Lab results since pt's last encounter. Pertinent lab results : see above ?I have ordered test including BMP, CBC, Mg ?I have reviewed the last note from staff over past 24 hours ?I have discussed pt's care plan and test results with nursing staff, case manager ? ? ?Disposition: ?Status is: Inpatient ?Remains inpatient appropriate because: Treatment as outlined in A&P ? ? ? Planned Discharge Destination: Home ? ?Antimicrobials: ? ? ?Consultants: ?IR ? ?Procedures:  ? ? ?DVT ppx:  ?enoxaparin (LOVENOX) injection 40 mg Start: 04/26/21 2200 ? ? ?  Code Status: Full Code  ? ? ?Author: ?Dwyane Dee, MD ?04/30/2021 4:16 PM ? ?For on call review [email or web address].  ? ?

## 2021-04-30 NOTE — Progress Notes (Signed)
Physical Therapy Treatment ?Patient Details ?Name: Sherry [name] ?MRN: [id number] ?DOB: 09-Apr-1930 ?Today's Date: 04/30/2021 ? ? ?History of Present Illness Pt is an 88 y.o. female with history of hypertension presents to the ER with complaints of increasing back pain and left ankle pain.  The pain is radiating from the low back all the way to the ankle the pain is mostly in the left ankle.  Denies any trauma or fall.  Patient has been having this pain for almost a week. MD assessment includes:Intractable lower back pain, spinal stenosis at L5-S1 and L4-L5, and hypertensive urgency. ? ?  ?PT Comments  ? ? Pt resting in bed upon PT/nurse arrival; pt requiring encouragement from pt's nurse and therapist to participate in therapy session.  Pt did not rate pain beginning of session when asked (although pt initially declining therapy d/t pain) but reports pain is better.  Modified independent with bed mobility; SBA with transfers using RW; and SBA ambulating 120 feet with RW (pt reporting feeling weak limiting distance but overall pt steady with ambulation using RW).  Pt reporting 5/10 upper abdominal pain (mild pain in low back) end of session: nurse present with pain meds for pt and aware of pain locations.  PT recommendations updated to HHPT (secure message sent to [facility]). ?  ?Recommendations for follow up therapy are one component of a multi-disciplinary discharge planning process, led by the attending physician.  Recommendations may be updated based on patient status, additional functional criteria and insurance authorization. ? ?Follow Up Recommendations ? Home health PT ?  ?  ?Assistance Recommended at Discharge Set up Supervision/Assistance  ?Patient can return home with the following Assistance with cooking/housework;Assist for transportation;Help with stairs or ramp for entrance ?  ?Equipment Recommendations ? Rolling walker (2 wheels)  ?  ?Recommendations for Other Services   ? ? ?  ?Precautions / Restrictions  Precautions ?Precautions: Fall ?Restrictions ?Weight Bearing Restrictions: No  ?  ? ?Mobility ? Bed Mobility ?Overal bed mobility: Needs Assistance ?Bed Mobility: Supine to Sit, Sit to Supine ?  ?  ?Supine to sit: Modified independent (Device/Increase time), HOB elevated ?Sit to supine: Modified independent (Device/Increase time), HOB elevated ?  ?General bed mobility comments: no difficulties noted ?  ? ?Transfers ?Overall transfer level: Needs assistance ?Equipment used: Rolling walker (2 wheels) ?Transfers: Sit to/from Stand ?Sit to Stand: Supervision ?  ?  ?  ?  ?  ?General transfer comment: mild increased effort to stand from bed but steady with RW use ?  ? ?Ambulation/Gait ?Ambulation/Gait assistance: Supervision ?Gait Distance (Feet): 120 Feet ?Assistive device: Rolling walker (2 wheels) ?  ?Gait velocity: mildly decreased ?  ?  ?General Gait Details: partial step through gait pattern; steady with RW use ? ? ?Stairs ?  ?  ?  ?  ?  ? ? ?Wheelchair Mobility ?  ? ?Modified Rankin (Stroke Patients Only) ?  ? ? ?  ?Balance Overall balance assessment: Needs assistance ?Sitting-balance support: No upper extremity supported, Feet supported ?Sitting balance-Leahy Scale: Good ?Sitting balance - Comments: steady sitting reaching within BOS ?  ?Standing balance support: Bilateral upper extremity supported, During functional activity ?Standing balance-Leahy Scale: Good ?Standing balance comment: steady ambulating with RW ?  ?  ?  ?  ?  ?  ?  ?  ?  ?  ?  ?  ? ?  ?Cognition Arousal/Alertness: Awake/alert ?Behavior During Therapy: [facility] for tasks assessed/performed ?Overall Cognitive Status: Within Functional Limits for tasks assessed ?  ?  ?  ?  ?  ?  ?  ?  ?  ?  ?  ?  ?  ?  ?  ?  ?  ?  ?  ? ?  ?  Exercises   ? ?  ?General Comments  Nursing cleared pt for participation in physical therapy. ?  ?  ? ?Pertinent Vitals/Pain Pain Assessment ?Pain Assessment: 0-10 ?Pain Score: 5  ?Pain Location: upper abdomen; low back ?Pain  Descriptors / Indicators: Constant ?Pain Intervention(s): Limited activity within patient's tolerance, Monitored during session, Repositioned, Patient requesting pain meds-RN notified, Other (comment) (RN gave pain meds end of session) ?Vitals (HR and O2 on room air) stable and WFL throughout treatment session.  ? ? ?Home Living   ?  ?  ?  ?  ?  ?  ?  ?  ?  ?   ?  ?Prior Function    ?  ?  ?   ? ?PT Goals (current goals can now be found in the care plan section) Acute Rehab PT Goals ?Patient Stated Goal: Less pain with mobiltiy ?PT Goal Formulation: With patient ?Time For Goal Achievement: 05/10/21 ?Potential to Achieve Goals: Good ?Progress towards PT goals: Progressing toward goals ? ?  ?Frequency ? ? ? Min 2X/week ? ? ? ?  ?PT Plan Discharge plan needs to be updated  ? ? ?Co-evaluation   ?  ?  ?  ?  ? ?  ?AM-PAC PT "6 Clicks" Mobility   ?Outcome Measure ? Help needed turning from your back to your side while in a flat bed without using bedrails?: None ?Help needed moving from lying on your back to sitting on the side of a flat bed without using bedrails?: None ?Help needed moving to and from a bed to a chair (including a wheelchair)?: A Little ?Help needed standing up from a chair using your arms (e.g., wheelchair or bedside chair)?: A Little ?Help needed to walk in hospital room?: A Little ?Help needed climbing 3-5 steps with a railing? : A Little ?6 Click Score: 20 ? ?  ?End of Session Equipment Utilized During Treatment: Gait belt ?Activity Tolerance: Patient limited by pain ?Patient left: in bed;with call bell/phone within reach;with bed alarm set;with nursing/sitter in room ?Nurse Communication: Mobility status;Precautions ?PT Visit Diagnosis: Difficulty in walking, not elsewhere classified (R26.2);Muscle weakness (generalized) (M62.81);Pain ?Pain - Right/Left: Left ?Pain - part of body: Leg;Ankle and joints of foot;Hip (low back; upper abdomen) ?  ? ? ?Time: 3383-2919 ?PT Time Calculation (min) (ACUTE ONLY):  12 min ? ?Charges:  $Therapeutic Activity: 8-22 mins          ?          ? ?Leitha Bleak, PT ?04/30/21, 5:44 PM ? ? ?

## 2021-05-01 DIAGNOSIS — I159 Secondary hypertension, unspecified: Secondary | ICD-10-CM

## 2021-05-01 MED ORDER — OXYCODONE HCL 5 MG PO TABS
10.0000 mg | ORAL_TABLET | ORAL | Status: DC | PRN
  Administered 2021-05-01 – 2021-05-06 (×9): 10 mg via ORAL
  Filled 2021-05-01 (×9): qty 2

## 2021-05-01 MED ORDER — HYDROMORPHONE HCL 1 MG/ML IJ SOLN
1.0000 mg | INTRAMUSCULAR | Status: DC | PRN
Start: 2021-05-01 — End: 2021-05-02
  Filled 2021-05-01: qty 1

## 2021-05-01 NOTE — Plan of Care (Signed)

## 2021-05-01 NOTE — Plan of Care (Signed)
?  Problem: Education: ?Goal: Knowledge of General Education information will improve ?Description: Including pain rating scale, medication(s)/side effects and non-pharmacologic comfort measures ?Outcome: Progressing ?  ?Problem: Clinical Measurements: ?Goal: Ability to maintain clinical measurements within normal limits will improve ?Outcome: Progressing ?  ?Problem: Clinical Measurements: ?Goal: Will remain free from infection ?Outcome: Progressing ?  ?Problem: Clinical Measurements: ?Goal: Respiratory complications will improve ?Outcome: Progressing ?  ?Problem: Coping: ?Goal: Level of anxiety will decrease ?Outcome: Progressing ?  ?Problem: Elimination: ?Goal: Will not experience complications related to bowel motility ?Outcome: Progressing ?  ?Problem: Safety: ?Goal: Ability to remain free from injury will improve ?Outcome: Progressing ?  ?

## 2021-05-01 NOTE — Care Management Important Message (Signed)
Important Message ? ?Patient Details  ?Name: Sherry [name] ?MRN: [id number] ?Date of Birth: [birth year] ? ? ?Medicare Important Message Given:  Yes ? ? ? ? ?Sherry [name] ?05/01/2021, 11:37 AM ?

## 2021-05-01 NOTE — TOC Progression Note (Signed)
Transition of Care (TOC) - Progression Note  ? ? ?Patient Details  ?Name: ROBBIN LOUGHMILLER ?MRN: [id number] ?Date of Birth: June 04, 1930 ? ?Transition of Care (TOC) CM/SW Contact  ?Conception Oms, RN ?Phone Number: ?05/01/2021, 10:29 AM ? ?Clinical Narrative:   Met with the patient and discussed home health and DME ? ?She said she doe snot want HH and she has a rolling walker and a raised toilet at home, she doe snot want DME ? ? ? ?Expected Discharge Plan: Home/Self Care ?Barriers to Discharge: Continued Medical Work up ? ?Expected Discharge Plan and Services ?Expected Discharge Plan: Home/Self Care ?  ?Discharge Planning Services: CM Consult ?  ?Living arrangements for the past 2 months: [place] ?                ?DME Arranged: N/A ?  ?  ?  ?  ?HH Arranged: Patient Refused HH, Refused SNF ?  ?  ?  ?  ? ? ?Social Determinants of Health (SDOH) Interventions ?  ? ?Readmission Risk Interventions ?No flowsheet data found. ? ?

## 2021-05-01 NOTE — Progress Notes (Signed)
?Progress Note ? ? ?Patient: Sherry [name] [id number] DOB: 03-Aug-1930 DOA: 04/26/2021     4 ?DOS: the patient was seen and examined on 05/01/2021 ?  ?Brief hospital course: ?GLENDENE WYER is a 88 y.o. female with history of HTN who presented to the ER with worsening low back pain and left ankle pan.  ?She endorsed this being severe pain outside normal for her.  She had endorsed walking longer than usual recently prior to admission but otherwise denied any obvious traumas or other injuries or overexertion. ?MRI lumbar spine showed multilevel degenerative changes notably subarticular recess stenosis at L4-L5 and L5-S1;  ?Foraminal stenosis greatest at L4-5 and facet arthropathy greatest at L4-5. ?She was also evaluated by neurosurgery on admission and recommended for ongoing nonsurgical management notably with consideration for outpatient injections and continuing pain control and working with physical therapy while hospitalized. ? ?Assessment and Plan: ?* Spinal stenosis at L4-L5 level ?- s/p possible overexertion at home with increased amount of activity she was not used to; MRI L spine shows multilevel degenerative changes with stenosis particularly noted involving L4-L5 and L5-S1 ?-Patient having significant difficulty with pain control during hospitalization which is also hindering her ability to work with physical therapy ?-No surgical intervention recommended per neurosurgery ?- s/p ESI to Left L5/S1 with IR on 3/1, greatly appreciated ?-Pain better controlled after discussion on 04/30/2021.  Dilaudid definitely did help ?-She understands today we will try to avoid using Dilaudid as much as possible and I have adjusted oxycodone dosing in efforts for tentative planning for discharge tomorrow ? ?Hypertensive urgency ?- Likely due to uncontrolled pain.  Pressure typically controlled at home per patient with 1 anti-hypertensive ?-BP better controlled today after adequate pain control and more compliance  with medications in general while in hospital now ? ?HTN (hypertension) ?- continue losartan ?- added amlodipine but patient refused ?- I do believe much of this is pain driven and once we can control this better we hopefully can decrease BP regimen some again ?- can also use PRN labetalol or hydralazine  ? ? ? ? ? ?Subjective: No events overnight.  Husband present bedside this morning.  She states that the Dilaudid helped greatly when given yesterday.  She understands plan today is for avoiding Dilaudid as much as able and utilizing oral medications. ? ?Physical Exam: ?Vitals:  ? 04/30/21 2310 05/01/21 0333 05/01/21 0640 05/01/21 0717  ?BP: (!) 159/73 (!) 173/72 (!) 172/77 (!) 158/78  ?Pulse: 73 76 77 73  ?Resp: 16 16  16   ?Temp: 98.1 ?F (36.7 ?C) 98.4 ?F (36.9 ?C)  98.5 ?F (36.9 ?C)  ?TempSrc:      ?SpO2: 97% 96%  97%  ?Weight:      ?Height:      ? ?Physical Exam ?Constitutional:   ?   Comments: No obvious and much more comfortable appearing today  ?HENT:  ?   Head: Normocephalic and atraumatic.  ?   Mouth/Throat:  ?   Mouth: Mucous membranes are moist.  ?Eyes:  ?   Extraocular Movements: Extraocular movements intact.  ?Cardiovascular:  ?   Rate and Rhythm: Normal rate and regular rhythm.  ?Pulmonary:  ?   Effort: Pulmonary effort is normal.  ?   Breath sounds: Normal breath sounds.  ?Abdominal:  ?   General: Bowel sounds are normal. There is no distension.  ?   Palpations: Abdomen is soft.  ?   Tenderness: There is no abdominal tenderness.  ?Musculoskeletal:  ?  Cervical back: Normal range of motion and neck supple.  ?Skin: ?   General: Skin is warm and dry.  ?Neurological:  ?   Mental Status: She is alert.  ?   Comments: Subjective paresthesia mildly noted in left lower extremity  ?Psychiatric:     ?   Mood and Affect: Mood normal.     ?   Behavior: Behavior normal.  ? ? ? ?Data Reviewed: ?No results found for this or any previous visit (from the past 24 hour(s)). ?  ?I have Reviewed nursing notes, Vitals, and  Lab results since pt's last encounter. Pertinent lab results : see above ?I have ordered test including BMP, CBC, Mg ?I have reviewed the last note from staff over past 24 hours ?I have discussed pt's care plan and test results with nursing staff, case manager ? ? ?Disposition: ?Status is: Inpatient ?Remains inpatient appropriate because: Treatment as outlined in A&P ? ? ? Planned Discharge Destination: Home tentatively planned for Saturday ? ?Antimicrobials: ? ? ?Consultants: ?IR ? ?Procedures:  ? ? ?DVT ppx:  ?enoxaparin (LOVENOX) injection 40 mg Start: 04/26/21 2200 ? ? ?  Code Status: Full Code  ? ? ?Author: ?Dwyane Dee, MD ?05/01/2021 5:02 PM ? ?For on call review [email or web address].  ? ?

## 2021-05-02 MED ORDER — BISACODYL 10 MG RE SUPP
10.0000 mg | Freq: Every day | RECTAL | Status: DC | PRN
  Administered 2021-05-02: 10 mg via RECTAL
  Filled 2021-05-02: qty 1

## 2021-05-02 MED ORDER — ALPRAZOLAM 0.25 MG PO TABS
0.2500 mg | ORAL_TABLET | Freq: Once | ORAL | Status: AC
Start: 2021-05-02 — End: 2021-05-02
  Administered 2021-05-02: 0.25 mg via ORAL
  Filled 2021-05-02: qty 1

## 2021-05-02 MED ORDER — MELATONIN 5 MG PO TABS
5.0000 mg | ORAL_TABLET | Freq: Every day | ORAL | Status: DC
  Administered 2021-05-02 – 2021-05-05 (×4): 5 mg via ORAL
  Filled 2021-05-02 (×4): qty 1

## 2021-05-02 MED ORDER — MAGNESIUM HYDROXIDE 400 MG/5ML PO SUSP
30.0000 mL | Freq: Once | ORAL | Status: AC
  Administered 2021-05-02: 30 mL via ORAL
  Filled 2021-05-02: qty 30

## 2021-05-02 MED ORDER — LACTULOSE 10 GM/15ML PO SOLN
20.0000 g | Freq: Once | ORAL | Status: AC
  Administered 2021-05-02: 20 g via ORAL
  Filled 2021-05-02: qty 30

## 2021-05-02 MED ORDER — MELOXICAM 7.5 MG PO TABS
15.0000 mg | ORAL_TABLET | Freq: Every day | ORAL | Status: DC
  Administered 2021-05-03 – 2021-05-06 (×4): 15 mg via ORAL
  Filled 2021-05-02 (×5): qty 2

## 2021-05-02 NOTE — Progress Notes (Signed)
?Progress Note ? ? ?Patient: Sherry [name] [id number] DOB: 22-Feb-1931 DOA: 04/26/2021     5 ?DOS: the patient was seen and examined on 05/02/2021 ?  ?Brief hospital course: ?Sherry [name] is a 88 y.o. female with history of HTN who presented to the ER with worsening low back pain and left ankle pan.  ?She endorsed this being severe pain outside normal for her.  She had endorsed walking longer than usual recently prior to admission but otherwise denied any obvious traumas or other injuries or overexertion. ?MRI lumbar spine showed multilevel degenerative changes notably subarticular recess stenosis at L4-L5 and L5-S1;  ?Foraminal stenosis greatest at L4-5 and facet arthropathy greatest at L4-5. ?She was also evaluated by neurosurgery on admission and recommended for ongoing nonsurgical management notably with consideration for outpatient injections and continuing pain control and working with physical therapy while hospitalized. ? ?Assessment and Plan: ?* Spinal stenosis at L4-L5 level ?- s/p possible overexertion at home with increased amount of activity she was not used to; MRI L spine shows multilevel degenerative changes with stenosis particularly noted involving L4-L5 and L5-S1 ?-Patient having significant difficulty with pain control during hospitalization which is also hindering her ability to work with physical therapy ?-No surgical intervention recommended per neurosurgery ?- s/p ESI to Left L5/S1 with IR on 3/1, greatly appreciated ?-Pain better controlled after discussion on 04/30/2021.  Dilaudid definitely did help ?-Attempting to transition to a pain regimen that she can use at home.  She is too nervous to go home without doing a trial of this first in the hospital in case it fails.  Regimen adjusted today to incorporate this ? ?Hypertensive urgency ?- Likely due to uncontrolled pain.  Pressure typically controlled at home per patient with 1 anti-hypertensive ?-BP better controlled today after  adequate pain control and more compliance with medications in general while in hospital now ? ?HTN (hypertension) ?- continue losartan ?- added amlodipine but patient refused ?- I do believe much of this is pain driven and once we can control this better we hopefully can decrease BP regimen some again ?- can also use PRN labetalol or hydralazine  ? ? ? ? ? ?Subjective: No events overnight.  She overall does appear more comfortable but she is nervous going home without doing a trial first of the regimen she would be on at home in case it does not work. ? ?Physical Exam: ?Vitals:  ? 05/02/21 0133 05/02/21 0358 05/02/21 0749 05/02/21 1135  ?BP: (!) 150/63 (!) 165/66 (!) 171/68 (!) 145/64  ?Pulse: 76 77 78 83  ?Resp: '16 17 18 20  '$ ?Temp:  97.9 ?F (36.6 ?C) 98.4 ?F (36.9 ?C) 98.5 ?F (36.9 ?C)  ?TempSrc:      ?SpO2: 95% 98% 98% 96%  ?Weight:      ?Height:      ? ?Physical Exam ?Constitutional:   ?   Comments: No obvious and much more comfortable appearing today  ?HENT:  ?   Head: Normocephalic and atraumatic.  ?   Mouth/Throat:  ?   Mouth: Mucous membranes are moist.  ?Eyes:  ?   Extraocular Movements: Extraocular movements intact.  ?Cardiovascular:  ?   Rate and Rhythm: Normal rate and regular rhythm.  ?Pulmonary:  ?   Effort: Pulmonary effort is normal.  ?   Breath sounds: Normal breath sounds.  ?Abdominal:  ?   General: Bowel sounds are normal. There is no distension.  ?   Palpations: Abdomen is soft.  ?  Tenderness: There is no abdominal tenderness.  ?Musculoskeletal:  ?   Cervical back: Normal range of motion and neck supple.  ?Skin: ?   General: Skin is warm and dry.  ?Neurological:  ?   Mental Status: She is alert.  ?   Comments: Subjective paresthesia mildly noted in left lower extremity. 4+/5 in LLE  ?Psychiatric:     ?   Mood and Affect: Mood normal.     ?   Behavior: Behavior normal.  ? ? ? ?Data Reviewed: ?No results found for this or any previous visit (from the past 24 hour(s)). ?  ?I have Reviewed nursing  notes, Vitals, and Lab results since pt's last encounter. Pertinent lab results : see above ?I have ordered test including BMP, CBC, Mg ?I have reviewed the last note from staff over past 24 hours ?I have discussed pt's care plan and test results with nursing staff, case manager ? ? ?Disposition: ?Status is: Inpatient ?Remains inpatient appropriate because: Treatment as outlined in A&P ? ? ? Planned Discharge Destination: Home tentatively planned for Saturday ? ?Antimicrobials: ? ? ?Consultants: ?IR ? ?Procedures:  ? ? ?DVT ppx:  ?enoxaparin (LOVENOX) injection 40 mg Start: 04/26/21 2200 ? ? ?  Code Status: Full Code  ? ? ?Author: ?Dwyane Dee, MD ?05/02/2021 2:12 PM ? ?For on call review [email or web address].  ? ?

## 2021-05-03 MED ORDER — SIMETHICONE 80 MG PO CHEW
160.0000 mg | CHEWABLE_TABLET | Freq: Once | ORAL | Status: DC
  Filled 2021-05-03: qty 2

## 2021-05-03 MED ORDER — SIMETHICONE 80 MG PO CHEW
160.0000 mg | CHEWABLE_TABLET | Freq: Four times a day (QID) | ORAL | Status: DC | PRN
  Administered 2021-05-03 (×2): 160 mg via ORAL
  Filled 2021-05-03 (×3): qty 2

## 2021-05-03 MED ORDER — FAMOTIDINE 20 MG PO TABS
20.0000 mg | ORAL_TABLET | Freq: Every day | ORAL | Status: DC
Start: 2021-05-03 — End: 2021-05-06
  Administered 2021-05-03 – 2021-05-06 (×4): 20 mg via ORAL
  Filled 2021-05-03 (×4): qty 1

## 2021-05-03 MED ORDER — ALUM & MAG HYDROXIDE-SIMETH 200-200-20 MG/5ML PO SUSP
30.0000 mL | ORAL | Status: DC | PRN
  Administered 2021-05-03 (×2): 30 mL via ORAL
  Filled 2021-05-03 (×2): qty 30

## 2021-05-03 NOTE — Progress Notes (Signed)
?  Cross Cover ?Nurse reports blood pressure uncontrolled and patient refusing BP meds cause they "make me feel funny" and have made pain in LUQ of abdomen ?Bedside she confirms this and states the BP meds she has been giving me have "made me feel funny"  described as "i just dont feel right.  Pain is described as continuous and she identifies what makes it worse is taking the medicine and what makes it better is when the medicine starts to wear off"  ?With the predinisone therapy that was started for her ankle may have actually be causing her gastric symptoms and may be contributing to her not "feeling quite right".   ?Started pepcid daily ?Maalox prn gastric pain - may consider carafate if maalox is not effective ?

## 2021-05-03 NOTE — Progress Notes (Signed)
?Progress Note ? ? ?Patient: Sherry [name] [id number]:[id number] DOB: [birth year] DOA: 04/26/2021     6 ?DOS: the patient was seen and examined on 05/03/2021 ?  ?Brief hospital course: ?Sherry [name] is a 88 y.o. female with history of HTN who presented to the ER with worsening low back pain and left ankle pan.  ?She endorsed this being severe pain outside normal for her.  She had endorsed walking longer than usual recently prior to admission but otherwise denied any obvious traumas or other injuries or overexertion. ?MRI lumbar spine showed multilevel degenerative changes notably subarticular recess stenosis at L4-L5 and L5-S1;  ?Foraminal stenosis greatest at L4-5 and facet arthropathy greatest at L4-5. ?She was also evaluated by neurosurgery on admission and recommended for ongoing nonsurgical management notably with consideration for outpatient injections and continuing pain control and working with physical therapy while hospitalized. ? ?Assessment and Plan: ?* Spinal stenosis at L4-L5 level ?- s/p possible overexertion at home with increased amount of activity she was not used to; MRI L spine shows multilevel degenerative changes with stenosis particularly noted involving L4-L5 and L5-S1 ?-Patient having significant difficulty with pain control during hospitalization which is also hindering her ability to work with physical therapy ?-No surgical intervention recommended per neurosurgery ?- s/p ESI to Left L5/S1 with IR on 3/1, greatly appreciated ?-Pain better controlled after discussion on 04/30/2021.  Dilaudid definitely did help ?-Patient transitioned to oral regimen on 05/02/2021 in anticipation for discharging home.  She seems to be tolerating this well, however she still is very nervous about going home and is concerned about having to come back due to uncontrolled pain.  Continual reassurance has been given and also called and spoke with her son on 05/03/2021.  For now, we will continue care in the hospital in  hopes that patient does feel more comfortable going home possibly by Monday. ? ?Hypertensive urgency ?- Likely due to uncontrolled pain.  Pressure typically controlled at home per patient with 1 anti-hypertensive ?-BP better controlled today after adequate pain control and more compliance with medications in general while in hospital now ? ?HTN (hypertension) ?- continue losartan and amlodipine ?-Continue PRN labetalol or hydralazine as well ? ? ? ? ? ?Subjective: No events overnight.  Long conversation had bedside this morning.  Essentially she is still too nervous to go home and worried about coming back or pain not being controlled at home.  She asked me to call and speak with her son this afternoon which I did as well. ?The hope is for continuing 1 more night in the hospital and seeing how comfortable she is with going home tomorrow. ? ?Physical Exam: ?Vitals:  ? 05/02/21 2120 05/02/21 2252 05/03/21 0516 05/03/21 0902  ?BP: (!) 172/69 121/68 (!) 187/70 (!) 154/76  ?Pulse: 78 75 73 92  ?Resp: (!) '24  20 18  '$ ?Temp: 98 ?F (36.7 ?C)  98.6 ?F (37 ?C) 97.7 ?F (36.5 ?C)  ?TempSrc:   Oral   ?SpO2: 97%  94% 98%  ?Weight:      ?Height:      ? ?Physical Exam ?Constitutional:   ?   Comments: No obvious and much more comfortable appearing today  ?HENT:  ?   Head: Normocephalic and atraumatic.  ?   Mouth/Throat:  ?   Mouth: Mucous membranes are moist.  ?Eyes:  ?   Extraocular Movements: Extraocular movements intact.  ?Cardiovascular:  ?   Rate and Rhythm: Normal rate and regular rhythm.  ?Pulmonary:  ?  Effort: Pulmonary effort is normal.  ?   Breath sounds: Normal breath sounds.  ?Abdominal:  ?   General: Bowel sounds are normal. There is no distension.  ?   Palpations: Abdomen is soft.  ?   Tenderness: There is no abdominal tenderness.  ?Musculoskeletal:  ?   Cervical back: Normal range of motion and neck supple.  ?Skin: ?   General: Skin is warm and dry.  ?Neurological:  ?   Mental Status: She is alert.  ?   Comments:  Subjective paresthesia mildly noted in left lower extremity. 4+/5 in LLE  ?Psychiatric:     ?   Mood and Affect: Mood normal.     ?   Behavior: Behavior normal.  ? ? ? ?Data Reviewed: ?No results found for this or any previous visit (from the past 24 hour(s)). ?  ?I have Reviewed nursing notes, Vitals, and Lab results since pt's last encounter. Pertinent lab results : see above ?I have ordered test including BMP, CBC, Mg ?I have reviewed the last note from staff over past 24 hours ?I have discussed pt's care plan and test results with nursing staff, case manager ? ? ?Disposition: ?Status is: Inpatient ?Remains inpatient appropriate because: Treatment as outlined in A&P ? ? ? Planned Discharge Destination: Home tentatively planned for Saturday ? ?Antimicrobials: ? ? ?Consultants: ?IR ? ?Procedures:  ? ? ?DVT ppx:  ?enoxaparin (LOVENOX) injection 40 mg Start: 04/26/21 2200 ? ? ?  Code Status: Full Code  ? ? ?Author: ?Dwyane Dee, MD ?05/03/2021 1:15 PM ? ?For on call review [email or web address].  ? ?

## 2021-05-04 MED ORDER — CAPSAICIN 0.025 % EX CREA
TOPICAL_CREAM | Freq: Two times a day (BID) | CUTANEOUS | Status: DC
  Filled 2021-05-04 (×2): qty 60

## 2021-05-04 NOTE — Progress Notes (Signed)
?Progress Note ? ? ?Patient: Sherry [name] [id number] DOB: 06/10/1930 DOA: 04/26/2021     7 ?DOS: the patient was seen and examined on 05/04/2021 ?  ?Brief hospital course: ?Sherry [name] is a 88 y.o. female with history of HTN who presented to the ER with worsening low back pain and left ankle pan.  ?She endorsed this being severe pain outside normal for her.  She had endorsed walking longer than usual recently prior to admission but otherwise denied any obvious traumas or other injuries or overexertion. ?MRI lumbar spine showed multilevel degenerative changes notably subarticular recess stenosis at L4-L5 and L5-S1;  ?Foraminal stenosis greatest at L4-5 and facet arthropathy greatest at L4-5. ?She was also evaluated by neurosurgery on admission and recommended for ongoing nonsurgical management notably with consideration for outpatient injections and continuing pain control and working with physical therapy while hospitalized. ? ?Assessment and Plan: ?* Spinal stenosis at L4-L5 level ?- s/p possible overexertion at home with increased amount of activity she was not used to; MRI L spine shows multilevel degenerative changes with stenosis particularly noted involving L4-L5 and L5-S1 ?-Patient having significant difficulty with pain control during hospitalization which is also hindering her ability to work with physical therapy ?-No surgical intervention recommended per neurosurgery ?- s/p ESI to Left L5/S1 with IR on 3/1, greatly appreciated ?-Pain better controlled after discussion on 04/30/2021.  Dilaudid definitely did help ?-Patient transitioned to oral regimen on 05/02/2021 in anticipation for discharging home.  She seems to be tolerating this well, however she still is very nervous about going home and is concerned about having to come back due to uncontrolled pain.  Continual reassurance has been given and also called and spoke with her son on 05/03/2021.  For now, we will continue care in the hospital in  hopes that patient does feel more comfortable going home very soon ? ?Hypertensive urgency-resolved as of 05/04/2021 ?- Likely due to uncontrolled pain.  Pressure typically controlled at home per patient with 1 anti-hypertensive ?-BP better controlled today after adequate pain control and more compliance with medications in general while in hospital now ? ?HTN (hypertension) ?- continue losartan and amlodipine ?-Continue PRN labetalol or hydralazine as well ?-Overall, BP seems to be stabilizing as pain comes under better control; she did have significantly elevated pressure last night around 930pm which correlated when she was having a severe pain attack ? ? ? ? ? ?Subjective: Patient endorsed having a severe pain episode last night around 9:30 PM.  This did respond well however to oral medications and I again tried to reassure her this would be the regimen she would go home on.  Furthermore, blood pressure also excessively elevated during this pain episode which has been the suspicion that pain is driving her hypertensive urgency.  Pain and blood pressure regimen have been adjusted accordingly as needed. ?She still voices a great amount of hesitation and concern over going home too soon with her pain the way it is.  Again had tried to reassure her that the oral regimen is effective. ? ?Physical Exam: ?Vitals:  ? 05/03/21 2141 05/03/21 2233 05/04/21 0440 05/04/21 0732  ?BP: (!) 215/73 (!) 165/72 (!) 120/54 (!) 147/58  ?Pulse: 66 76 83 68  ?Resp: '18  20 16  '$ ?Temp: 98.6 ?F (37 ?C)  98.3 ?F (36.8 ?C) (!) 97.5 ?F (36.4 ?C)  ?TempSrc: Oral  Oral   ?SpO2: 100%  98% 97%  ?Weight:      ?Height:      ? ?  Physical Exam ?Constitutional:   ?   Comments: Frail and weak appearing but seems more comfortable  ?HENT:  ?   Head: Normocephalic and atraumatic.  ?   Mouth/Throat:  ?   Mouth: Mucous membranes are moist.  ?Eyes:  ?   Extraocular Movements: Extraocular movements intact.  ?Cardiovascular:  ?   Rate and Rhythm: Normal rate and  regular rhythm.  ?Pulmonary:  ?   Effort: Pulmonary effort is normal.  ?   Breath sounds: Normal breath sounds.  ?Abdominal:  ?   General: Bowel sounds are normal. There is no distension.  ?   Palpations: Abdomen is soft.  ?   Tenderness: There is no abdominal tenderness.  ?Musculoskeletal:  ?   Cervical back: Normal range of motion and neck supple.  ?Skin: ?   General: Skin is warm and dry.  ?Neurological:  ?   Mental Status: She is alert.  ?   Comments: Subjective paresthesia mildly noted in left lower extremity. 4+/5 in LLE  ?Psychiatric:     ?   Mood and Affect: Mood normal.     ?   Behavior: Behavior normal.  ? ? ? ?Data Reviewed: ?No results found for this or any previous visit (from the past 24 hour(s)). ?  ?I have Reviewed nursing notes, Vitals, and Lab results since pt's last encounter. ?I have reviewed the last note from staff over past 24 hours ?I have discussed pt's care plan and test results with nursing staff, case manager ? ? ?Disposition: ?Status is: Inpatient ?Remains inpatient appropriate because: Treatment as outlined in A&P ? ? ? Planned Discharge Destination: Home tentatively planned for next 1-2 days ? ?Antimicrobials: ? ? ?Consultants: ?IR ? ?Procedures:  ?ESI to L5/S1 on 04/29/21 ? ?DVT ppx:  ?enoxaparin (LOVENOX) injection 40 mg Start: 04/26/21 2200 ? ? ?  Code Status: Full Code  ? ? ?Author: ?Dwyane Dee, MD ?05/04/2021 12:26 PM ? ?For on call review [email or web address].  ? ?

## 2021-05-04 NOTE — Progress Notes (Signed)
PT Cancellation Note ? ?Patient Details ?Name: Sherry [name] ?MRN: [id number] ?DOB: 03/09/1930 ? ? ?Cancelled Treatment:    Reason Eval/Treat Not Completed: Other (comment).  Chart reviewed.  Pt laying flat in bed upon PT arrival--pt reporting being comfortable in bed (received pain medication about an hour ago) and refused to get up at this time.  Encouraged pt to participate in therapy and educated pt on benefits of mobility but pt continued to decline--pt reports she will get up and walk when she feels up to it--nurse notified. ? ?Leitha Bleak, PT ?05/04/21, 10:10 AM ? ?

## 2021-05-05 ENCOUNTER — Inpatient Hospital Stay: Payer: Medicare Other

## 2021-05-05 MED ORDER — DICLOFENAC SODIUM 1 % EX GEL
2.0000 g | Freq: Four times a day (QID) | CUTANEOUS | Status: DC | PRN
  Administered 2021-05-05 (×2): 2 g via TOPICAL
  Filled 2021-05-05: qty 100

## 2021-05-05 MED ORDER — GABAPENTIN 100 MG PO CAPS
100.0000 mg | ORAL_CAPSULE | Freq: Every day | ORAL | Status: DC
  Administered 2021-05-05: 100 mg via ORAL
  Filled 2021-05-05: qty 1

## 2021-05-05 NOTE — Progress Notes (Signed)
[place] [facility] ? ? ?PATIENT NAME: Sherry [name]   ? ?MR#:  [id number] ? ?DATE OF BIRTH:  [birth year] ? ?SUBJECTIVE:  ?complains of left hip pain. She also has back pain. Husband at bedside. ?Tells me how some of the medications don't work for her. She is a bit anxious about trying anything new. Did not work with physical therapy yesterday. ?Tells me she did not sleep too well. ?RN tells me patient had refused pain medication yesterday. ? ?VITALS:  ?Blood pressure (!) 141/51, pulse 72, temperature 97.9 ?F (36.6 ?C), resp. rate 14, height '5\' 3"'$  (1.6 m), weight 63.6 kg, SpO2 97 %. ? ?PHYSICAL EXAMINATION:  ? ?GENERAL:  88 y.o.-year-old patient lying in the bed with no acute distress.  ?LUNGS: Normal breath sounds bilaterally, no wheezing, rales, rhonchi.  ?CARDIOVASCULAR: S1, S2 normal. No murmurs, rubs, or gallops.  ?ABDOMEN: Soft, nontender, nondistended. Bowel sounds present.  ?EXTREMITIES: No  edema b/l.    ?NEUROLOGIC: nonfocal  patient is alert and awake ?SKIN: No obvious rash, lesion, or ulcer.  ? ?LABORATORY PANEL:  ?CBC ?Recent Labs  ?Lab 04/29/21 ?7341  ?WBC 10.5  ?HGB 12.1  ?HCT 37.4  ?PLT 266  ? ? ?Chemistries  ?Recent Labs  ?Lab 04/29/21 ?9379  ?NA 139  ?K 3.8  ?CL 106  ?CO2 26  ?GLUCOSE 106*  ?BUN 30*  ?CREATININE 0.69  ?CALCIUM 9.1  ?MG 1.7  ? ?Cardiac Enzymes ?No results for input(s): TROPONINI in the last 168 hours. ?RADIOLOGY:  ?DG HIP UNILAT WITH PELVIS 2-3 VIEWS LEFT ? ?Result Date: 05/05/2021 ?CLINICAL DATA:  Left hip pain for 2 weeks EXAM: DG HIP (WITH OR WITHOUT PELVIS) 2-3V LEFT COMPARISON:  04/26/2021 FINDINGS: Symmetric spurring at the hip joints without joint space narrowing. Enthesophytes at the bilateral greater trochanter. No evidence of fracture, erosion, or focal osteopenia. Advanced lower lumbar degeneration. IMPRESSION: 1. Stable compared to 04/26/2021.  No acute finding. 2. Symmetric hip spurring and trochanteric enthesophytes. Electronically  Signed   By: Jorje Guild M.D.   On: 05/05/2021 10:22   ? ?Assessment and Plan ? ?Sherry [name] is a 88 y.o. female with history of HTN who presented to the ER with worsening low back pain and left ankle pan.  ?She endorsed this being severe pain outside normal for her.  She had endorsed walking longer than usual recently prior to admission but otherwise denied any obvious traumas or other injuries or overexertion. ?MRI lumbar spine showed multilevel degenerative changes notably subarticular recess stenosis at L4-L5 and L5-S1;  ? ?spinal stenosis with back pain at L4-5 level ?radicular symptoms ?left hip pain ?-- neurosurgery recommends conservative treatment ?-- status post epidural steroid injection to left L5/S1 with IR on first March ?-- pain improved ?-- will continue Percocet for now. 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 For now will continue meloxicam, Percocet, Voltaren gel, lidocaine patch, add Neurontin hundred milligrams at bedtime ?-- k-pad ?--3/7-- patient ambulated hundred and 60 feet with rolling walker with physical therapy. PT recommends home health. ? ?Hypertension ?-- intermittent elevated BP secondary to pain and anxiety ?-- continue current meds which is losartan and amlodipine ? ? ?Procedures: ?Family communication : husband at bedside ?Consults : neurosurgery ?CODE STATUS: full ?DVT Prophylaxis : enoxaparin ?Level of care: Med-Surg ?Status is: Inpatient ?Remains inpatient appropriate because: patient anxious about her pain meds, ongoing intermittent back  and left hip pain ? ?patient worked with physical therapy today. Discussed with patient and husband goal for discharge will be 05/06/21 ?  ? ?TOTAL TIME TAKING CARE OF THIS PATIENT: 25 minutes.  ?>50% time spent on counselling and coordination of care ? ?Note: This dictation was prepared with Dragon dictation along with smaller phrase  technology. Any transcriptional errors that result from this process are unintentional. ? ?Fritzi Mandes M.D  ? ? ?[facility]  ? ?CC: ?Primary care physician; Rusty Aus, MD  ?

## 2021-05-05 NOTE — Progress Notes (Signed)
Physical Therapy Treatment ?Patient Details ?Name: NATALEE TOMKIEWICZ ?MRN: [id number] ?DOB: 1931/01/05 ?Today's Date: 05/05/2021 ? ? ?History of Present Illness Pt is an 88 y.o. female with history of hypertension presents to the ER with complaints of increasing back pain and left ankle pain.  The pain is radiating from the low back all the way to the ankle the pain is mostly in the left ankle.  Denies any trauma or fall.  Patient has been having this pain for almost a week. MD assessment includes:Intractable lower back pain, spinal stenosis at L5-S1 and L4-L5, and hypertensive urgency. ? ?  ?PT Comments  ? ? Progressive increase in gait distance (160') and overall activity tolerance; do recommend continued use of RW for all mobility at this time.  Demonstrates ability to safely negotiate household distances (no steps required to enter/exit home); okay for discharge home with [facility] services when medically appropriate.  Encouraged continued OOB efforts with staff as appropriate throughout remaining hospital stay. ? ?Continues to rate pain in back (some radiating to L LE) 6/10, unchanged with mobility or repositioning; meds received prior to this session. ? ?   ?Recommendations for follow up therapy are one component of a multi-disciplinary discharge planning process, led by the attending physician.  Recommendations may be updated based on patient status, additional functional criteria and insurance authorization. ? ?Follow Up Recommendations ? Home health PT ?  ?  ?Assistance Recommended at Discharge PRN  ?Patient can return home with the following Assistance with cooking/housework;Assist for transportation;Help with stairs or ramp for entrance;A little help with walking and/or transfers;A little help with bathing/dressing/bathroom ?  ?Equipment Recommendations ? Rolling walker (2 wheels)  ?  ?Recommendations for Other Services   ? ? ?  ?Precautions / Restrictions Precautions ?Precautions: Fall ?Restrictions ?Weight  Bearing Restrictions: No  ?  ? ?Mobility ? Bed Mobility ?Overal bed mobility: Needs Assistance ?Bed Mobility: Supine to Sit ?  ?  ?Supine to sit: Modified independent (Device/Increase time) ?  ?  ?General bed mobility comments: fluid movement, no physical assist required; no increased pain reported ?  ? ?Transfers ?Overall transfer level: Needs assistance ?Equipment used: Rolling walker (2 wheels) ?Transfers: Sit to/from Stand ?Sit to Stand: Supervision ?  ?  ?  ?  ?  ?General transfer comment: slightly guarded, does require use of UEs to stabilize ?  ? ?Ambulation/Gait ?Ambulation/Gait assistance: Supervision ?Gait Distance (Feet): 160 Feet ?Assistive device: Rolling walker (2 wheels) ?  ?  ?  ?  ?General Gait Details: partially reciprocal stepping pattern, excessive L LE ER throughout gait cycle (partially corrected with cuing), slightly antalgic; no overt buckling or LOB noted. Moderate reliance on RW, do recommend continued use of RW with all mobility tasks. ? ? ?Stairs ?  ?  ?  ?  ?  ? ? ?Wheelchair Mobility ?  ? ?Modified Rankin (Stroke Patients Only) ?  ? ? ?  ?Balance Overall balance assessment: Needs assistance ?Sitting-balance support: No upper extremity supported, Feet supported ?Sitting balance-Leahy Scale: Good ?  ?  ?Standing balance support: Bilateral upper extremity supported ?Standing balance-Leahy Scale: Fair ?  ?  ?  ?  ?  ?  ?  ?  ?  ?  ?  ?  ?  ? ?  ?Cognition Arousal/Alertness: Awake/alert ?Behavior During Therapy: [facility] for tasks assessed/performed ?Overall Cognitive Status: Within Functional Limits for tasks assessed ?  ?  ?  ?  ?  ?  ?  ?  ?  ?  ?  ?  ?  ?  ?  ?  ?  ?  ?  ? ?  ?  Exercises Other Exercises ?Other Exercises: Reviewed back precautions and functional implications; patient voices understanding. ?Other Exercises: Sit/stand from various seating sufaces (edge of bed, recliner), close sup; does rely on UE support to complete. ?Other Exercises: Bed/chair transfer with RW, close sup ? ?   ?General Comments   ?  ?  ? ?Pertinent Vitals/Pain Pain Assessment ?Pain Assessment: No/denies pain ?Pain Score: 6  ?Pain Descriptors / Indicators: Aching, Guarding, Grimacing ?Pain Intervention(s): Limited activity within patient's tolerance, Monitored during session, Repositioned  ? ? ?Home Living   ?  ?  ?  ?  ?  ?  ?  ?  ?  ?   ?  ?Prior Function    ?  ?  ?   ? ?PT Goals (current goals can now be found in the care plan section) Acute Rehab PT Goals ?Patient Stated Goal: Less pain with mobiltiy ?PT Goal Formulation: With patient ?Time For Goal Achievement: 05/10/21 ?Potential to Achieve Goals: Good ?Progress towards PT goals: Progressing toward goals ? ?  ?Frequency ? ? ? Min 2X/week ? ? ? ?  ?PT Plan Discharge plan needs to be updated;Current plan remains appropriate  ? ? ?Co-evaluation   ?  ?  ?  ?  ? ?  ?AM-PAC PT "6 Clicks" Mobility   ?Outcome Measure ? Help needed turning from your back to your side while in a flat bed without using bedrails?: None ?Help needed moving from lying on your back to sitting on the side of a flat bed without using bedrails?: None ?Help needed moving to and from a bed to a chair (including a wheelchair)?: None ?Help needed standing up from a chair using your arms (e.g., wheelchair or bedside chair)?: None ?Help needed to walk in hospital room?: A Little ?Help needed climbing 3-5 steps with a railing? : A Little ?6 Click Score: 22 ? ?  ?End of Session Equipment Utilized During Treatment: Gait belt ?Activity Tolerance: Patient tolerated treatment well;Patient limited by pain ?Patient left: in chair;with call bell/phone within reach (chair alarm not in room; RN to obtain and place in chair) ?Nurse Communication: Mobility status;Precautions ?PT Visit Diagnosis: Difficulty in walking, not elsewhere classified (R26.2);Muscle weakness (generalized) (M62.81);Pain ?Pain - Right/Left: Left ?Pain - part of body: Leg;Ankle and joints of foot;Hip ?  ? ? ?Time: 7026-3785 ?PT Time Calculation  (min) (ACUTE ONLY): 23 min ? ?Charges:  $Gait Training: 8-22 mins ?$Therapeutic Activity: 8-22 mins          ?          ? ?Macaulay Reicher H. Owens Shark, PT, DPT, NCS ?05/05/21, 3:00 PM ?[phone] ? ? ?

## 2021-05-05 NOTE — Care Management Important Message (Signed)
Important Message ? ?Patient Details  ?Name: Sherry [name] ?MRN: [id number] ?Date of Birth: 11-01-1930 ? ? ?Medicare Important Message Given:  Yes ? ? ? ? ?[name] ?05/05/2021, 10:32 AM ?

## 2021-05-06 MED ORDER — OXYCODONE HCL 10 MG PO TABS: 10.0000 mg | ORAL_TABLET | Freq: Three times a day (TID) | ORAL | 0 refills | Status: DC | PRN

## 2021-05-06 MED ORDER — MELOXICAM 15 MG PO TABS: 15.0000 mg | ORAL_TABLET | Freq: Every day | ORAL | 0 refills | Status: DC

## 2021-05-06 MED ORDER — FAMOTIDINE 20 MG PO TABS: 20.0000 mg | ORAL_TABLET | Freq: Every day | ORAL | 0 refills | Status: DC

## 2021-05-06 MED ORDER — DICLOFENAC SODIUM 1 % EX GEL: 2.0000 g | Freq: Four times a day (QID) | CUTANEOUS | 0 refills | Status: DC | PRN

## 2021-05-06 MED ORDER — GABAPENTIN 100 MG PO CAPS
100.0000 mg | ORAL_CAPSULE | Freq: Every day | ORAL | 2 refills | Status: DC
Start: 2021-05-06 — End: 2021-05-26

## 2021-05-06 MED ORDER — AMLODIPINE BESYLATE 10 MG PO TABS: 10.0000 mg | ORAL_TABLET | Freq: Every day | ORAL | 2 refills | Status: DC

## 2021-05-06 NOTE — Plan of Care (Signed)
No new changes in assessment. ? ? ?Problem: Education: ?Goal: Knowledge of General Education information will improve ?Description: Including pain rating scale, medication(s)/side effects and non-pharmacologic comfort measures ?Outcome: Progressing ?  ?Problem: Health Behavior/Discharge Planning: ?Goal: Ability to manage health-related needs will improve ?Outcome: Progressing ?  ?Problem: Clinical Measurements: ?Goal: Ability to maintain clinical measurements within normal limits will improve ?Outcome: Progressing ?Goal: Will remain free from infection ?Outcome: Progressing ?Goal: Diagnostic test results will improve ?Outcome: Progressing ?Goal: Respiratory complications will improve ?Outcome: Progressing ?Goal: Cardiovascular complication will be avoided ?Outcome: Progressing ?  ?Problem: Activity: ?Goal: Risk for activity intolerance will decrease ?Outcome: Progressing ?  ?Problem: Nutrition: ?Goal: Adequate nutrition will be maintained ?Outcome: Progressing ?  ?Problem: Coping: ?Goal: Level of anxiety will decrease ?Outcome: Progressing ?  ?Problem: Elimination: ?Goal: Will not experience complications related to bowel motility ?Outcome: Progressing ?Goal: Will not experience complications related to urinary retention ?Outcome: Progressing ?  ?Problem: Pain Managment: ?Goal: General experience of comfort will improve ?Outcome: Progressing ?  ?Problem: Safety: ?Goal: Ability to remain free from injury will improve ?Outcome: Progressing ?  ?Problem: Skin Integrity: ?Goal: Risk for impaired skin integrity will decrease ?Outcome: Progressing ?  ?

## 2021-05-06 NOTE — Discharge Summary (Signed)
Physician Discharge Summary   Patient: Sherry [name] MRN: [id number] DOB: [birth year]  Admit date:     04/26/2021  Discharge date: 05/06/21  Discharge Physician: Fritzi Mandes   PCP: Rusty Aus, MD   Recommendations at discharge:   patient will follow-up with neurosurgery Dr. Lacinda Axon first available appointment follow-up with Dr. Sabra Heck in 1 to 2 weeks  Discharge Diagnoses: spinal stenosis with back pain at L4 - five level with radicular symptoms left hip DJD  Hospital Course: MARQUIS DILES is a 88 y.o. female with history of HTN who presented to the ER with worsening low back pain and left ankle pan.  She endorsed this being severe pain outside normal for her.  She had endorsed walking longer than usual recently prior to admission but otherwise denied any obvious traumas or other injuries or overexertion. MRI lumbar spine showed multilevel degenerative changes notably subarticular recess stenosis at L4-L5 and L5-S1   spinal stenosis with back pain at L4-5 level radicular symptoms left hip pain -- neurosurgery recommends conservative treatment -- status post epidural steroid injection to left L5/S1 with IR on first March -- pain improved -- will continue oxycodone for now. Patient however has some left hip pain -- x-ray left hip shows DJD with spur -- discussed at length with patient regarding pain management. For now will continue meloxicam, Percocet, Voltaren gel, lidocaine patch, add Neurontin100 milligrams at bedtime -- k-pad --3/7-- patient ambulated hundred and 160 feet with rolling walker with physical therapy. PT recommends home health.   Hypertension -- intermittent elevated BP secondary to pain and anxiety -- continue current meds  losartan and amlodipine  patient will discharged to home with home health PT OT. she is advised to follow-up with Dr. Sabra Heck and neurosurgery     Consultants: neurosurgery Procedures performed: epidural injection L5 S1 IR on  March 1 Disposition: Home health Diet recommendation:  Discharge Diet Orders (From admission, onward)     Start     Ordered   05/06/21 0000  Diet - low sodium heart healthy        05/06/21 0811           Cardiac diet DISCHARGE MEDICATION: Allergies as of 05/06/2021       Reactions   Codeine    Morphine And Related Other (See Comments)   Respiratory Distress   Percocet [oxycodone-acetaminophen]         Medication List     STOP taking these medications    celecoxib 200 MG capsule Commonly known as: CELEBREX   HYDROcodone-acetaminophen 5-325 MG tablet Commonly known as: NORCO/VICODIN   losartan 100 MG tablet Commonly known as: COZAAR   meclizine 25 MG tablet Commonly known as: ANTIVERT   predniSONE 20 MG tablet Commonly known as: DELTASONE       TAKE these medications    acetaminophen 500 MG tablet Commonly known as: TYLENOL Take 500-1,000 mg by mouth every 6 (six) hours as needed for mild pain or fever.   amLODipine 10 MG tablet Commonly known as: NORVASC Take 1 tablet (10 mg total) by mouth daily.   B-12 1000 MCG Caps Take 1,000 mcg by mouth daily.   Biotin 5 MG Tabs Take 5 mg by mouth daily.   cholecalciferol 25 MCG (1000 UNIT) tablet Commonly known as: VITAMIN D Take 2,000 Units by mouth daily.   diclofenac Sodium 1 % Gel Commonly known as: VOLTAREN Apply 2 g topically every 6 (six) hours as needed (pain).   famotidine 20 MG  tablet Commonly known as: PEPCID Take 1 tablet (20 mg total) by mouth daily.   gabapentin 100 MG capsule Commonly known as: NEURONTIN Take 1 capsule (100 mg total) by mouth at bedtime. What changed: how much to take   meloxicam 15 MG tablet Commonly known as: MOBIC Take 1 tablet (15 mg total) by mouth daily.   olmesartan-hydrochlorothiazide 40-12.5 MG tablet Commonly known as: BENICAR HCT Take 1 tablet by mouth daily.   Oxycodone HCl 10 MG Tabs Take 1 tablet (10 mg total) by mouth every 8 (eight) hours as  needed for severe pain.   polyethylene glycol 17 g packet Commonly known as: MIRALAX / GLYCOLAX Take 17 g by mouth daily.               Durable Medical Equipment  (From admission, onward)           Start     Ordered   05/01/21 0814  For home use only DME Walker rolling  Once       Question Answer Comment  Walker: With 5 Inch Wheels   Patient needs a walker to treat with the following condition Generalized muscle weakness   Patient needs a walker to treat with the following condition Spinal stenosis      05/01/21 6468            Follow-up Information     Meade Maw, MD. Schedule an appointment as soon as possible for a visit .   Specialty: Neurosurgery Contact information: [place] [address] [phone]         Rusty Aus, MD. Schedule an appointment as soon as possible for a visit in 1 week(s).   Specialty: Internal Medicine Why: hospital f/u Contact information: [place] [address] [phone]                Discharge Exam: Sherry [name] Weights   04/26/21 1909  Weight: 63.6 kg     Condition at discharge: fair  The results of significant diagnostics from this hospitalization (including imaging, microbiology, ancillary and laboratory) are listed below for reference.   Imaging Studies: DG Ankle 2 Views Left  Result Date: 04/26/2021 CLINICAL DATA:  Left ankle pain EXAM: LEFT ANKLE - 2 VIEW COMPARISON:  None. FINDINGS: There is no evidence of fracture, dislocation, or joint effusion. There is no evidence of arthropathy or other focal bone abnormality. Soft tissues are unremarkable. IMPRESSION: Negative. Electronically Signed   By: Rolm Baptise M.D.   On: 04/26/2021 18:09   MR LUMBAR SPINE WO CONTRAST  Result Date: 04/26/2021 CLINICAL DATA:  Low back pain, cauda equina syndrome suspected EXAM: MRI LUMBAR SPINE WITHOUT CONTRAST TECHNIQUE: Multiplanar,  multisequence MR imaging of the lumbar spine was performed. No intravenous contrast was administered. COMPARISON:  No recent imaging.  Prior is from 2006 FINDINGS: Motion artifact is present. Segmentation: Standard. Alignment: Grade 1 anterolisthesis at L5-S1 and L4-L5. Retrolisthesis at L2-L3 and L3-L4. Vertebrae: Degenerative endplate irregularity. Lumbar vertebral body heights are otherwise maintained. Chronic appearing mild T10 compression fracture. Minor degenerative endplate marrow edema. Mild marrow edema associated with right L3-L4 facet arthropathy. No suspicious osseous lesion. Conus medullaris and cauda equina: Conus extends to the superior L2 level. Conus and cauda equina appear normal. Paraspinal and other soft tissues: Unremarkable. Disc levels: T12-L1: Disc bulge eccentric to the right. Mild facet arthropathy. No significant canal or foraminal stenosis. L1-L2: Disc bulge with endplate osteophytic ridging. Mild facet arthropathy.  No significant canal or foraminal stenosis. L2-L3: Disc bulge with endplate osteophytic ridging. Mild facet arthropathy with ligamentum flavum infolding. No significant canal stenosis. Mild right and moderate left foraminal stenosis. L3-L4: Disc bulge with endplate osteophytic ridging. Mild facet arthropathy with ligamentum flavum infolding. No significant canal stenosis. Mild right and mild to moderate left foraminal stenosis. L4-L5: Disc bulge with superimposed right foraminal protrusion. Marked facet arthropathy with ligamentum flavum infolding. Moderate canal stenosis. Narrowing of the right greater than left subarticular recesses. Moderate to marked right and mild left foraminal stenosis. L5-S1: Disc bulge. Marked facet arthropathy with ligamentum flavum infolding. Moderate canal stenosis. Narrowing of the subarticular recesses. Mild to moderate foraminal stenosis, greater on the right. IMPRESSION: Multilevel degenerative changes as detailed above. Canal and subarticular  recess stenosis are greatest at L4-L5 and L5-S1. Foraminal stenosis is greatest on the right at L4-L5. Facet arthropathy is greatest at L4-L5. Electronically Signed   By: Macy Mis M.D.   On: 04/26/2021 14:57   IR INJECT DIAG/THERA/INC NEEDLE/CATH/PLC EPI/LUMB/SAC W/IMG  Result Date: 04/30/2021 CLINICAL DATA:  88 year old female with severe low back pain and left lower extremity radiculopathy with pain radiating into the hip, thigh and lateral aspect of ankle. MR imaging demonstrates severe multilevel degenerative disease with the most significant canal and subarticular recess stenosis at L4-L5 and L5-S1. She presents today for trans laminar epidural steroid injection. Based on symptom distribution and MR imaging we will proceed with a left L5-S1 ESI. FLUOROSCOPY: Radiation Exposure Index (as provided by the fluoroscopic device): 8.1 mGy Kerma PROCEDURE: The procedure, risks, benefits, and alternatives were explained to the patient. Questions regarding the procedure were encouraged and answered. The patient understands and consents to the procedure. LUMBAR EPIDURAL INJECTION: An interlaminar approach was performed on left at L5-S1. The overlying skin was cleansed and anesthetized. A 20 gauge epidural needle was advanced using loss-of-resistance technique. DIAGNOSTIC EPIDURAL INJECTION: Injection of Isovue-M 200 shows a good epidural pattern with spread above and below the level of needle placement, primarily on the left no vascular opacification is seen. THERAPEUTIC EPIDURAL INJECTION: 80 mg of Depo-Medrol mixed with 2 mL 1% lidocaine were instilled. The procedure was well-tolerated, and the patient was discharged thirty minutes following the injection in good condition. COMPLICATIONS: None. IMPRESSION: Technically successful epidural injection on the left L5-S1. Electronically Signed   By: Jacqulynn Cadet M.D.   On: 04/30/2021 09:14   DG HIP UNILAT WITH PELVIS 2-3 VIEWS LEFT  Result Date:  05/05/2021 CLINICAL DATA:  Left hip pain for 2 weeks EXAM: DG HIP (WITH OR WITHOUT PELVIS) 2-3V LEFT COMPARISON:  04/26/2021 FINDINGS: Symmetric spurring at the hip joints without joint space narrowing. Enthesophytes at the bilateral greater trochanter. No evidence of fracture, erosion, or focal osteopenia. Advanced lower lumbar degeneration. IMPRESSION: 1. Stable compared to 04/26/2021.  No acute finding. 2. Symmetric hip spurring and trochanteric enthesophytes. Electronically Signed   By: Jorje Guild M.D.   On: 05/05/2021 10:22   DG Hip Unilat W or Wo Pelvis 2-3 Views Left  Result Date: 04/26/2021 CLINICAL DATA:  Left leg pain EXAM: DG HIP (WITH OR WITHOUT PELVIS) 2-3V LEFT COMPARISON:  08/13/2017 FINDINGS: There is no evidence of hip fracture or dislocation. Left hip joint space is relatively well preserved. There is no evidence of arthropathy or other focal bone abnormality. IMPRESSION: Negative. Electronically Signed   By: Davina Poke D.O.   On: 04/26/2021 15:01    Microbiology: Results for orders placed or performed during the hospital encounter of  04/26/21  Resp Panel by RT-PCR (Flu A&B, Covid) Nasopharyngeal Swab     Status: None   Collection Time: 04/26/21  5:09 PM   Specimen: Nasopharyngeal Swab; Nasopharyngeal(NP) swabs in vial transport medium  Result Value Ref Range Status   SARS Coronavirus 2 by RT PCR NEGATIVE NEGATIVE Final    Comment: (NOTE) SARS-CoV-2 target nucleic acids are NOT DETECTED.  The SARS-CoV-2 RNA is generally detectable in upper respiratory specimens during the acute phase of infection. The lowest concentration of SARS-CoV-2 viral copies this assay can detect is 138 copies/mL. A negative result does not preclude SARS-Cov-2 infection and should not be used as the sole basis for treatment or other patient management decisions. A negative result may occur with  improper specimen collection/handling, submission of specimen other than nasopharyngeal swab,  presence of viral mutation(s) within the areas targeted by this assay, and inadequate number of viral copies(<138 copies/mL). A negative result must be combined with clinical observations, patient history, and epidemiological information. The expected result is Negative.  Fact Sheet for Patients:  EntrepreneurPulse.com.au  Fact Sheet for Healthcare Providers:  IncredibleEmployment.be  This test is no t yet approved or cleared by the [place] FDA and  has been authorized for detection and/or diagnosis of SARS-CoV-2 by FDA under an Emergency Use Authorization (EUA). This EUA will remain  in effect (meaning this test can be used) for the duration of the COVID-19 declaration under Section 564(b)(1) of the Act, 21 U.S.C.section 360bbb-3(b)(1), unless the authorization is terminated  or revoked sooner.       Influenza A by PCR NEGATIVE NEGATIVE Final   Influenza B by PCR NEGATIVE NEGATIVE Final    Comment: (NOTE) The Xpert Xpress SARS-CoV-2/FLU/RSV plus assay is intended as an aid in the diagnosis of influenza from Nasopharyngeal swab specimens and should not be used as a sole basis for treatment. Nasal washings and aspirates are unacceptable for Xpert Xpress SARS-CoV-2/FLU/RSV testing.  Fact Sheet for Patients: EntrepreneurPulse.com.au  Fact Sheet for Healthcare Providers: IncredibleEmployment.be  This test is not yet approved or cleared by the [place] FDA and has been authorized for detection and/or diagnosis of SARS-CoV-2 by FDA under an Emergency Use Authorization (EUA). This EUA will remain in effect (meaning this test can be used) for the duration of the COVID-19 declaration under Section 564(b)(1) of the Act, 21 U.S.C. section 360bbb-3(b)(1), unless the authorization is terminated or revoked.  Performed at [facility], [address] [place], [place] [address]      Labs: CBC: No results for input(s): WBC, NEUTROABS, HGB, HCT, MCV, PLT in the last 168 hours. Basic Metabolic Panel: No results for input(s): NA, K, CL, CO2, GLUCOSE, BUN, CREATININE, CALCIUM, MG, PHOS in the last 168 hours. Liver Function Tests: No results for input(s): AST, ALT, ALKPHOS, BILITOT, PROT, ALBUMIN in the last 168 hours. CBG: No results for input(s): GLUCAP in the last 168 hours.  Discharge time spent: greater than 30 minutes.  Signed: Fritzi Mandes, MD [facility] 05/06/2021

## 2021-05-06 NOTE — TOC Progression Note (Signed)
Transition of Care (TOC) - Progression Note  ? ? ?Patient Details  ?Name: RAYONNA HELDMAN ?MRN: [id number] ?Date of Birth: 1930/04/08 ? ?Transition of Care (TOC) CM/SW Contact  ?Conception Oms, RN ?Phone Number: ?05/06/2021, 8:39 AM ? ?Clinical Narrative:   The patient is now agreeable to have [facility] PT  ?[facility] has accepted the patient for [facility] ?The patient still denies the need for DME ? ? ?Expected Discharge Plan: Home/Self Care ?Barriers to Discharge: Barriers Resolved ? ?Expected Discharge Plan and Services ?Expected Discharge Plan: Home/Self Care ?  ?Discharge Planning Services: CM Consult ?  ?Living arrangements for the past 2 months: [place] ?Expected Discharge Date: 05/06/21               ?DME Arranged: N/A ?  ?  ?  ?  ?HH Arranged: Patient Refused HH, Refused SNF ?  ?  ?  ?  ? ? ?Social Determinants of Health (SDOH) Interventions ?  ? ?Readmission Risk Interventions ?No flowsheet data found. ? ?

## 2021-05-06 NOTE — Evaluation (Signed)
Occupational Therapy Evaluation ?Patient Details ?Name: Sherry [name] ?MRN: [id number] ?DOB: 1930/12/01 ?Today's Date: 05/06/2021 ? ? ?History of Present Illness Pt is an 88 y.o. female with history of hypertension presents to the ER with complaints of increasing back pain and left ankle pain.  The pain is radiating from the low back all the way to the ankle the pain is mostly in the left ankle.  Denies any trauma or fall.  Patient has been having this pain for almost a week. MD assessment includes:Intractable lower back pain, spinal stenosis at L5-S1 and L4-L5, and hypertensive urgency.  ? ?Clinical Impression ?  ?Pt seen for OT evaluation today in setting of acute hospitalization with LBP. She currently is able to get to EOB sitting and stand with RW with minimal safety cues and MOD I. She demos G balance. She is able to complete LB ADLs with modifications such as figure-4 technique with OT education. OT also educates re: use of AE PRN to reduce bending/twisting motions. No further OT service needs detected at this time. Pt up to chair to eat breakfast end of session.  ?   ? ?Recommendations for follow up therapy are one component of a multi-disciplinary discharge planning process, led by the attending physician.  Recommendations may be updated based on patient status, additional functional criteria and insurance authorization.  ? ?Follow Up Recommendations ? No OT follow up  ?  ?Assistance Recommended at Discharge Set up Supervision/Assistance  ?Patient can return home with the following Assistance with cooking/housework ? ?  ?Functional Status Assessment ? Patient has not had a recent decline in their functional status  ?Equipment Recommendations ?    ?  ?Recommendations for Other Services   ? ? ?  ?Precautions / Restrictions Precautions ?Precautions: Fall ?Restrictions ?Weight Bearing Restrictions: No  ? ?  ? ?Mobility Bed Mobility ?Overal bed mobility: Needs Assistance ?Bed Mobility: Supine to Sit ?  ?   ?Supine to sit: Modified independent (Device/Increase time) ?  ?  ?  ?  ? ?Transfers ?Overall transfer level: Modified independent ?Equipment used: Rolling walker (2 wheels) ?Transfers: Sit to/from Stand ?Sit to Stand: Supervision ?  ?  ?  ?  ?  ?General transfer comment: one cue for safe seuqnecing of RW ?  ? ?  ?Balance Overall balance assessment: Needs assistance ?Sitting-balance support: No upper extremity supported, Feet supported ?Sitting balance-Leahy Scale: Good ?  ?  ?Standing balance support: Bilateral upper extremity supported ?Standing balance-Leahy Scale: Fair ?  ?  ?  ?  ?  ?  ?  ?  ?  ?  ?  ?  ?   ? ?ADL either performed or assessed with clinical judgement  ? ?ADL Overall ADL's : Modified independent ?  ?  ?  ?  ?  ?  ?  ?  ?  ?  ?  ?  ?  ?  ?  ?  ?  ?  ?  ?General ADL Comments: MOD I for use of AE for LB ADLs.  ? ? ? ?Vision Patient Visual Report: No change from baseline ?   ?   ?Perception   ?  ?Praxis   ?  ? ?Pertinent Vitals/Pain Pain Assessment ?Pain Assessment: 0-10 ?Pain Score: 4  ?Pain Location: upper abdomen; low back ?Pain Descriptors / Indicators: Aching, Guarding, Grimacing ?Pain Intervention(s): Limited activity within patient's tolerance, Monitored during session, Repositioned  ? ? ? ?Hand Dominance   ?  ?Extremity/Trunk Assessment Upper Extremity Assessment ?Upper  Extremity Assessment: Generalized weakness ?  ?Lower Extremity Assessment ?Lower Extremity Assessment: Generalized weakness ?  ?  ?  ?Communication Communication ?Communication: No difficulties ?  ?Cognition Arousal/Alertness: Awake/alert ?Behavior During Therapy: [facility] for tasks assessed/performed ?Overall Cognitive Status: Within Functional Limits for tasks assessed ?  ?  ?  ?  ?  ?  ?  ?  ?  ?  ?  ?  ?  ?  ?  ?  ?General Comments: parrticular ?  ?  ?General Comments    ? ?  ?Exercises Other Exercises ?Other Exercises: revewed back -precautions as they apply to LB ADLs. Pt with moderate reception, ed re: AE use ?  ?Shoulder  Instructions    ? ? ?Home Living Family/patient expects to be discharged to:: Private residence ?Living Arrangements: Spouse/significant other ?Available Help at Discharge: Family;Available 24 hours/day ?Type of Home: House ?Home Access: Level entry ?  ?  ?Home Layout: One level ?  ?  ?  ?  ?  ?  ?  ?Home Equipment: Conservation officer, nature (2 wheels) ?  ?  ?  ? ?  ?Prior Functioning/Environment   ?  ?  ?  ?  ?  ?  ?Mobility Comments: Ind amb community distances without an AD, no fall history ?ADLs Comments: Ind with ADLs ?  ? ?  ?  ?OT Problem List: Decreased activity tolerance;Pain ?  ?   ?OT Treatment/Interventions: Self-care/ADL training;Therapeutic activities;DME and/or AE instruction  ?  ?OT Goals(Current goals can be found in the care plan section) Acute Rehab OT Goals ?Patient Stated Goal: to go home ?OT Goal Formulation: All assessment and education complete, DC therapy  ?OT Frequency:   ?  ? ?Co-evaluation   ?  ?  ?  ?  ? ?  ?AM-PAC OT "6 Clicks" Daily Activity     ?Outcome Measure Help from another person eating meals?: None ?Help from another person taking care of personal grooming?: None ?Help from another person toileting, which includes using toliet, bedpan, or urinal?: None ?Help from another person bathing (including washing, rinsing, drying)?: A Little ?Help from another person to put on and taking off regular upper body clothing?: None ?Help from another person to put on and taking off regular lower body clothing?: A Little ?6 Click Score: 22 ?  ?End of Session Equipment Utilized During Treatment: Rolling walker (2 wheels) ?Nurse Communication: Mobility status ? ?Activity Tolerance: Patient tolerated treatment well ?Patient left: in chair;with call bell/phone within reach ? ?OT Visit Diagnosis: Muscle weakness (generalized) (M62.81);Pain ?Pain - part of body:  (LBP)  ?              ?Time: 9449-6759 ?OT Time Calculation (min): 13 min ?Charges:  OT General Charges ?$OT Visit: 1 Visit ?OT Evaluation ?$OT Eval  Low Complexity: 1 Low ? ?Gerrianne Scale, [place], OTR/L ?ascom [phone] ?05/06/21, 5:21 PM  ?

## 2021-05-14 ENCOUNTER — Inpatient Hospital Stay
Admission: EM | Admit: 2021-05-14 | Discharge: 2021-05-26 | DRG: 392 | Disposition: A | Discharge status: Home Health | Payer: Medicare Other | Attending: Internal Medicine | Admitting: Internal Medicine

## 2021-05-14 ENCOUNTER — Emergency Department: Payer: Medicare Other

## 2021-05-14 ENCOUNTER — Other Ambulatory Visit: Payer: Self-pay

## 2021-05-14 DIAGNOSIS — R110 Nausea: Secondary | ICD-10-CM

## 2021-05-14 DIAGNOSIS — A419 Sepsis, unspecified organism: Secondary | ICD-10-CM | POA: Diagnosis not present

## 2021-05-14 DIAGNOSIS — Z79899 Other long term (current) drug therapy: Secondary | ICD-10-CM

## 2021-05-14 DIAGNOSIS — K2980 Duodenitis without bleeding: Secondary | ICD-10-CM | POA: Diagnosis not present

## 2021-05-14 DIAGNOSIS — E785 Hyperlipidemia, unspecified: Secondary | ICD-10-CM | POA: Diagnosis present

## 2021-05-14 DIAGNOSIS — K5750 Diverticulosis of both small and large intestine without perforation or abscess without bleeding: Secondary | ICD-10-CM | POA: Diagnosis present

## 2021-05-14 DIAGNOSIS — M25551 Pain in right hip: Secondary | ICD-10-CM | POA: Diagnosis present

## 2021-05-14 DIAGNOSIS — D75839 Thrombocytosis, unspecified: Secondary | ICD-10-CM | POA: Diagnosis present

## 2021-05-14 DIAGNOSIS — K5900 Constipation, unspecified: Secondary | ICD-10-CM | POA: Diagnosis present

## 2021-05-14 DIAGNOSIS — I10 Essential (primary) hypertension: Secondary | ICD-10-CM | POA: Diagnosis not present

## 2021-05-14 DIAGNOSIS — K3189 Other diseases of stomach and duodenum: Secondary | ICD-10-CM

## 2021-05-14 DIAGNOSIS — R1013 Epigastric pain: Secondary | ICD-10-CM | POA: Diagnosis not present

## 2021-05-14 DIAGNOSIS — Z8249 Family history of ischemic heart disease and other diseases of the circulatory system: Secondary | ICD-10-CM

## 2021-05-14 DIAGNOSIS — M48061 Spinal stenosis, lumbar region without neurogenic claudication: Secondary | ICD-10-CM | POA: Diagnosis present

## 2021-05-14 DIAGNOSIS — I951 Orthostatic hypotension: Secondary | ICD-10-CM | POA: Clinically undetermined

## 2021-05-14 DIAGNOSIS — K5710 Diverticulosis of small intestine without perforation or abscess without bleeding: Secondary | ICD-10-CM

## 2021-05-14 DIAGNOSIS — I2510 Atherosclerotic heart disease of native coronary artery without angina pectoris: Secondary | ICD-10-CM | POA: Diagnosis present

## 2021-05-14 DIAGNOSIS — M5432 Sciatica, left side: Secondary | ICD-10-CM | POA: Clinically undetermined

## 2021-05-14 DIAGNOSIS — K265 Chronic or unspecified duodenal ulcer with perforation: Principal | ICD-10-CM

## 2021-05-14 DIAGNOSIS — Z791 Long term (current) use of non-steroidal anti-inflammatories (NSAID): Secondary | ICD-10-CM

## 2021-05-14 DIAGNOSIS — R1084 Generalized abdominal pain: Secondary | ICD-10-CM

## 2021-05-14 DIAGNOSIS — G8929 Other chronic pain: Secondary | ICD-10-CM | POA: Diagnosis present

## 2021-05-14 DIAGNOSIS — R109 Unspecified abdominal pain: Secondary | ICD-10-CM | POA: Diagnosis present

## 2021-05-14 DIAGNOSIS — D649 Anemia, unspecified: Secondary | ICD-10-CM

## 2021-05-14 DIAGNOSIS — Z90710 Acquired absence of both cervix and uterus: Secondary | ICD-10-CM

## 2021-05-14 DIAGNOSIS — I452 Bifascicular block: Secondary | ICD-10-CM | POA: Diagnosis present

## 2021-05-14 DIAGNOSIS — D638 Anemia in other chronic diseases classified elsewhere: Secondary | ICD-10-CM | POA: Diagnosis present

## 2021-05-14 DIAGNOSIS — F419 Anxiety disorder, unspecified: Secondary | ICD-10-CM | POA: Diagnosis present

## 2021-05-14 DIAGNOSIS — Z885 Allergy status to narcotic agent status: Secondary | ICD-10-CM

## 2021-05-14 DIAGNOSIS — M4807 Spinal stenosis, lumbosacral region: Secondary | ICD-10-CM | POA: Diagnosis present

## 2021-05-14 LAB — CBC WITH DIFFERENTIAL/PLATELET
Abs Immature Granulocytes: 0.14 10*3/uL — ABNORMAL HIGH (ref 0.00–0.07)
Basophils Absolute: 0 10*3/uL (ref 0.0–0.1)
Basophils Relative: 0 %
Eosinophils Absolute: 0 10*3/uL (ref 0.0–0.5)
Eosinophils Relative: 0 %
HCT: 37 % (ref 36.0–46.0)
Hemoglobin: 12.1 g/dL (ref 12.0–15.0)
Immature Granulocytes: 1 %
Lymphocytes Relative: 16 %
Lymphs Abs: 2.6 10*3/uL (ref 0.7–4.0)
MCH: 29 pg (ref 26.0–34.0)
MCHC: 32.7 g/dL (ref 30.0–36.0)
MCV: 88.7 fL (ref 80.0–100.0)
Monocytes Absolute: 1.4 10*3/uL — ABNORMAL HIGH (ref 0.1–1.0)
Monocytes Relative: 9 %
Neutro Abs: 11.9 10*3/uL — ABNORMAL HIGH (ref 1.7–7.7)
Neutrophils Relative %: 74 %
Platelets: 599 10*3/uL — ABNORMAL HIGH (ref 150–400)
RBC: 4.17 MIL/uL (ref 3.87–5.11)
RDW: 13.5 % (ref 11.5–15.5)
WBC: 16.1 10*3/uL — ABNORMAL HIGH (ref 4.0–10.5)
nRBC: 0 % (ref 0.0–0.2)

## 2021-05-14 LAB — COMPREHENSIVE METABOLIC PANEL
ALT: 29 U/L (ref 0–44)
AST: 22 U/L (ref 15–41)
Albumin: 3.9 g/dL (ref 3.5–5.0)
Alkaline Phosphatase: 64 U/L (ref 38–126)
Anion gap: 13 (ref 5–15)
BUN: 38 mg/dL — ABNORMAL HIGH (ref 8–23)
CO2: 24 mmol/L (ref 22–32)
Calcium: 10 mg/dL (ref 8.9–10.3)
Chloride: 96 mmol/L — ABNORMAL LOW (ref 98–111)
Creatinine, Ser: 0.68 mg/dL (ref 0.44–1.00)
GFR, Estimated: >60 mL/min (ref >60)
Glucose, Bld: 134 mg/dL — ABNORMAL HIGH (ref 70–99)
Potassium: 4.4 mmol/L (ref 3.5–5.1)
Sodium: 133 mmol/L — ABNORMAL LOW (ref 135–145)
Total Bilirubin: 0.3 mg/dL (ref 0.3–1.2)
Total Protein: 7.4 g/dL (ref 6.5–8.1)

## 2021-05-14 LAB — TROPONIN I (HIGH SENSITIVITY)
Troponin I (High Sensitivity): 9 ng/L (ref <18)
Troponin I (High Sensitivity): 8 ng/L (ref <18)

## 2021-05-14 LAB — LIPASE, BLOOD: Lipase: 39 U/L (ref 11–51)

## 2021-05-14 MED ORDER — PIPERACILLIN-TAZOBACTAM 3.375 G IVPB 30 MIN
3.3750 g | Freq: Once | INTRAVENOUS | Status: AC
Start: 2021-05-14 — End: 2021-05-15
  Administered 2021-05-15: 3.375 g via INTRAVENOUS
  Filled 2021-05-14: qty 50

## 2021-05-14 MED ORDER — IOHEXOL 300 MG/ML  SOLN
75.0000 mL | Freq: Once | INTRAMUSCULAR | Status: AC | PRN
  Administered 2021-05-14: 75 mL via INTRAVENOUS

## 2021-05-14 MED ORDER — METRONIDAZOLE 500 MG/100ML IV SOLN
500.0000 mg | Freq: Once | INTRAVENOUS | Status: DC
Start: 2021-05-14 — End: 2021-05-14

## 2021-05-14 MED ORDER — KETOROLAC TROMETHAMINE 30 MG/ML IJ SOLN
15.0000 mg | Freq: Once | INTRAMUSCULAR | Status: AC
  Administered 2021-05-14: 15 mg via INTRAVENOUS
  Filled 2021-05-14: qty 1

## 2021-05-14 MED ORDER — CIPROFLOXACIN IN D5W 400 MG/200ML IV SOLN
400.0000 mg | Freq: Once | INTRAVENOUS | Status: DC
Start: 2021-05-14 — End: 2021-05-14

## 2021-05-14 MED ORDER — SODIUM CHLORIDE 0.9 % IV BOLUS
1000.0000 mL | Freq: Once | INTRAVENOUS | Status: AC
  Administered 2021-05-14: 1000 mL via INTRAVENOUS

## 2021-05-14 MED ORDER — FENTANYL CITRATE PF 50 MCG/ML IJ SOSY
50.0000 ug | PREFILLED_SYRINGE | INTRAMUSCULAR | Status: DC | PRN
  Administered 2021-05-15: 50 ug via INTRAVENOUS
  Filled 2021-05-14: qty 1

## 2021-05-14 MED ORDER — ONDANSETRON HCL 4 MG/2ML IJ SOLN
4.0000 mg | Freq: Four times a day (QID) | INTRAMUSCULAR | Status: DC | PRN
  Administered 2021-05-15 (×2): 4 mg via INTRAVENOUS
  Filled 2021-05-14 (×2): qty 2

## 2021-05-14 NOTE — H&P (Signed)
?History and Physical  ? ? ?Patient: Sherry [name] WUJ:[id number] DOB: 1930/03/29 ?DOA: 05/14/2021 ?DOS: the patient was seen and examined on 05/15/2021 ?PCP: Rusty Aus, MD  ?Patient coming from: Home ? ?Chief Complaint:  ?Chief Complaint  ?Patient presents with  ? Abdominal Pain  ? ?HPI: Sherry [name] is a 88 y.o. female with medical history significant of Htn, and spinal stenosis.  ? ?Chart reviewed shows patient had a stress test on 05/26/2017 showing ?Narrative & Impression  ?Exercise myocardial perfusion imaging study with no significant  ischemia ?Normal wall motion, EF estimated a 97% ?No EKG changes concerning for ischemia at peak stress or in recovery. ?Target heart rate achieved, exercised for 4.6 METs ?Low risk scan ?  ?Signed, ?Esmond Plants, MD, Ph.D ?[facility]  ?Pt states it started when she was being treated for spinal stenosis and that back pain turned into this abdominal pain with features below: ?Duration: LAST WEEK. ? ?Frequency: Constant.  ? ?Location: Generalized abdomen.  ? ?Quality:Dull  ? ?Rate: 10/10  ? ?Radiation:NR  ? ?Aggravating:Nothing  ? ?Alleviating: Nothing ? ?Associated factors:Nausea  ? ? ?Review of Systems: Review of Systems  ?Gastrointestinal:  Positive for abdominal pain, constipation, nausea and vomiting.  ?All other systems reviewed and are negative. ? ?Past Medical History:  ?Diagnosis Date  ? Actinic keratosis   ? Hypertension   ? ?Past Surgical History:  ?Procedure Laterality Date  ? BACK SURGERY    ? IR INJECT/THERA/INC NEEDLE/CATH/PLC EPI/LUMB/SAC W/IMG  04/29/2021  ? MOHS SURGERY    ? ?Social History:  reports that she has never smoked. She has never used smokeless tobacco. She reports current alcohol use. She reports that she does not use drugs. ? ?Allergies  ?Allergen Reactions  ? Codeine   ? Morphine And Related Other (See Comments)  ?  Respiratory Distress ?  ? Percocet [Oxycodone-Acetaminophen]   ? ? ?Family History  ?Problem Relation Age of Onset  ?  Hypertension Father   ? ? ?Prior to Admission medications   ?Medication Sig Start Date End Date Taking? Authorizing Provider  ?acetaminophen (TYLENOL) 500 MG tablet Take 500-1,000 mg by mouth every 6 (six) hours as needed for mild pain or fever.    [provider]  ?amLODipine (NORVASC) 10 MG tablet Take 1 tablet (10 mg total) by mouth daily. 05/06/21   Fritzi Mandes, MD  ?Biotin 5 MG TABS Take 5 mg by mouth daily.    [provider]  ?cholecalciferol (VITAMIN D) 25 MCG (1000 UNIT) tablet Take 2,000 Units by mouth daily.    [provider]  ?Cyanocobalamin (B-12) 1000 MCG CAPS Take 1,000 mcg by mouth daily.    [provider]  ?diclofenac Sodium (VOLTAREN) 1 % GEL Apply 2 g topically every 6 (six) hours as needed (pain). 05/06/21   Fritzi Mandes, MD  ?famotidine (PEPCID) 20 MG tablet Take 1 tablet (20 mg total) by mouth daily. 05/06/21   Fritzi Mandes, MD  ?gabapentin (NEURONTIN) 100 MG capsule Take 1 capsule (100 mg total) by mouth at bedtime. 05/06/21   Fritzi Mandes, MD  ?meloxicam (MOBIC) 15 MG tablet Take 1 tablet (15 mg total) by mouth daily. 05/06/21   Fritzi Mandes, MD  ?olmesartan-hydrochlorothiazide (BENICAR HCT) 40-12.5 MG tablet Take 1 tablet by mouth daily. 02/11/21   [provider]  ?oxyCODONE 10 MG TABS Take 1 tablet (10 mg total) by mouth every 8 (eight) hours as needed for severe pain. 05/06/21   Fritzi Mandes, MD  ?polyethylene glycol (  MIRALAX / GLYCOLAX) 17 g packet Take 17 g by mouth daily. 02/18/20   Shelly Coss, MD  ? ? ?Physical Exam: ?Vitals:  ? 05/14/21 2130 05/14/21 2155 05/14/21 2300 05/15/21 0100  ?BP: (!) 160/67 (!) 176/57 (!) 183/65 (!) 181/63  ?Pulse: 76 72 79 94  ?Resp:  20 19   ?Temp:      ?TempSrc:      ?SpO2: 98% 98% 99% 99%  ?Weight:      ?Height:      ?Physical Exam ?Vitals and nursing note reviewed.  ?Constitutional:   ?   General: She is not in acute distress. ?   Appearance: Normal appearance. She is not ill-appearing, toxic-appearing or diaphoretic.   ?HENT:  ?   Head: Normocephalic and atraumatic.  ?   Right Ear: Hearing and external ear normal.  ?   Left Ear: Hearing and external ear normal.  ?   Nose: Nose normal. No nasal deformity.  ?   Mouth/Throat:  ?   Lips: Pink.  ?   Mouth: Mucous membranes are moist.  ?   Tongue: No lesions.  ?   Pharynx: Oropharynx is clear.  ?Eyes:  ?   Extraocular Movements: Extraocular movements intact.  ?   Pupils: Pupils are equal, round, and reactive to light.  ?Neck:  ?   Vascular: No carotid bruit.  ?Cardiovascular:  ?   Rate and Rhythm: Normal rate and regular rhythm.  ?   Pulses: Normal pulses.  ?   Heart sounds: Normal heart sounds.  ?Pulmonary:  ?   Effort: Pulmonary effort is normal.  ?   Breath sounds: Normal breath sounds.  ?Abdominal:  ?   General: Abdomen is protuberant. Bowel sounds are absent. There is distension.  ?   Palpations: Abdomen is soft. There is no mass.  ?   Tenderness: There is generalized abdominal tenderness. There is no guarding.  ?   Hernia: No hernia is present.  ? ? ?Musculoskeletal:  ?   Right lower leg: No edema.  ?   Left lower leg: No edema.  ?Skin: ?   General: Skin is warm.  ?Neurological:  ?   General: No focal deficit present.  ?   Mental Status: She is alert and oriented to person, place, and time.  ?   Cranial Nerves: Cranial nerves 2-12 are intact.  ?   Motor: Motor function is intact.  ?Psychiatric:     ?   Attention and Perception: Attention normal.     ?   Mood and Affect: Mood normal.     ?   Speech: Speech normal.     ?   Behavior: Behavior normal. Behavior is cooperative.     ?   Cognition and Memory: Cognition normal.  ? ? ?Data Reviewed: ?Results for orders placed or performed during the hospital encounter of 05/14/21 (from the past 24 hour(s))  ?CBC with Differential     Status: Abnormal  ? Collection Time: 05/14/21  7:58 PM  ?Result Value Ref Range  ? WBC 16.1 (H) 4.0 - 10.5 K/uL  ? RBC 4.17 3.87 - 5.11 MIL/uL  ? Hemoglobin 12.1 12.0 - 15.0 g/dL  ? HCT 37.0 36.0 - 46.0 %  ?  MCV 88.7 80.0 - 100.0 fL  ? MCH 29.0 26.0 - 34.0 pg  ? MCHC 32.7 30.0 - 36.0 g/dL  ? RDW 13.5 11.5 - 15.5 %  ? Platelets 599 (H) 150 - 400 K/uL  ? nRBC 0.0 0.0 -  0.2 %  ? Neutrophils Relative % 74 %  ? Neutro Abs 11.9 (H) 1.7 - 7.7 K/uL  ? Lymphocytes Relative 16 %  ? Lymphs Abs 2.6 0.7 - 4.0 K/uL  ? Monocytes Relative 9 %  ? Monocytes Absolute 1.4 (H) 0.1 - 1.0 K/uL  ? Eosinophils Relative 0 %  ? Eosinophils Absolute 0.0 0.0 - 0.5 K/uL  ? Basophils Relative 0 %  ? Basophils Absolute 0.0 0.0 - 0.1 K/uL  ? Immature Granulocytes 1 %  ? Abs Immature Granulocytes 0.14 (H) 0.00 - 0.07 K/uL  ?Comprehensive metabolic panel     Status: Abnormal  ? Collection Time: 05/14/21  7:58 PM  ?Result Value Ref Range  ? Sodium 133 (L) 135 - 145 mmol/L  ? Potassium 4.4 3.5 - 5.1 mmol/L  ? Chloride 96 (L) 98 - 111 mmol/L  ? CO2 24 22 - 32 mmol/L  ? Glucose, Bld 134 (H) 70 - 99 mg/dL  ? BUN 38 (H) 8 - 23 mg/dL  ? Creatinine, Ser 0.68 0.44 - 1.00 mg/dL  ? Calcium 10.0 8.9 - 10.3 mg/dL  ? Total Protein 7.4 6.5 - 8.1 g/dL  ? Albumin 3.9 3.5 - 5.0 g/dL  ? AST 22 15 - 41 U/L  ? ALT 29 0 - 44 U/L  ? Alkaline Phosphatase 64 38 - 126 U/L  ? Total Bilirubin 0.3 0.3 - 1.2 mg/dL  ? GFR, Estimated >60 >60 mL/min  ? Anion gap 13 5 - 15  ?Lipase, blood     Status: None  ? Collection Time: 05/14/21  7:58 PM  ?Result Value Ref Range  ? Lipase 39 11 - 51 U/L  ?Troponin I (High Sensitivity)     Status: None  ? Collection Time: 05/14/21  7:58 PM  ?Result Value Ref Range  ? Troponin I (High Sensitivity) 9 <18 ng/L  ?Troponin I (High Sensitivity)     Status: None  ? Collection Time: 05/14/21  9:58 PM  ?Result Value Ref Range  ? Troponin I (High Sensitivity) 8 <18 ng/L  ?Lactic acid, plasma     Status: None  ? Collection Time: 05/14/21 11:58 PM  ?Result Value Ref Range  ? Lactic Acid, Venous 1.6 0.5 - 1.9 mmol/L  ? ? ?Assessment and Plan: ?* Abdominal pain ?Attribute abdominal pain to patient's perforated duodenal ulcer. ?Patient n.p.o., IV PPI, IV Zofran as  needed, general surgery consulted urgently. ?Discussed with patient about the findings and possibility that she may require surgery and blood transfusion patient agrees to both. ?Patient does not know what

## 2021-05-14 NOTE — ED Triage Notes (Signed)
Pt presents to ER c/o epigastric area pain that started last night.  Pt states pain starts in epigastric area and goes into center of her chest.  Pt denies any vomiting since her symptoms started.  Pt A&O x4 at this time.  Pt appears uncomfortable in triage.  Denies hx of GERD or heart problems.   ?

## 2021-05-14 NOTE — ED Provider Notes (Signed)
? ?[facility] ?Provider Note ? ? Event Date/Time  ? First MD Initiated Contact with Patient 05/14/21 2134   ?  (approximate) ?History  ?Abdominal Pain ? ?HPI ?Sherry [name] is a 88 y.o. female with a stated past medical history of of spinal stenosis, anxiety, hyperlipidemia, and hypertension who presents for epigastric abdominal pain that has been present since 1 day prior to arrival and associated with constipation.  Patient states that she took medication for this constipation that finally worked after she presented to the emergency department and her pain is significantly improved.  Patient denies any vomiting, diarrhea, abdominal pain, chest pain, shortness of breath.  Patient denies any history of of reflux or cardiac pathology ?Physical Exam  ?Triage Vital Signs: ?ED Triage Vitals  ?Enc Vitals Group  ?   BP 05/14/21 1947 (!) 157/70  ?   Pulse Rate 05/14/21 1947 91  ?   Resp 05/14/21 1947 18  ?   Temp 05/14/21 1947 98.6 ?F (37 ?C)  ?   Temp Source 05/14/21 1947 Oral  ?   SpO2 05/14/21 1947 94 %  ?   Weight 05/14/21 1947 130 lb (59 kg)  ?   Height 05/14/21 1947 '5\' 3"'$  (1.6 m)  ?   Head Circumference --   ?   Peak Flow --   ?   Pain Score 05/14/21 1954 6  ?   Pain Loc --   ?   Pain Edu? --   ?   Excl. in [place]? --   ? ?Most recent vital signs: ?Vitals:  ? 05/14/21 2155 05/14/21 2300  ?BP: (!) 176/57 (!) 183/65  ?Pulse: 72 79  ?Resp: 20 19  ?Temp:    ?SpO2: 98% 99%  ? ?General: Awake, oriented x4. ?CV:  Good peripheral perfusion.  ?Resp:  Normal effort.  ?Abd:  No distention.  ?Other:  Elderly Caucasian female laying in bed in no distress ?ED Results / Procedures / Treatments  ?Labs ?(all labs ordered are listed, but only abnormal results are displayed) ?Labs Reviewed  ?CBC WITH DIFFERENTIAL/PLATELET - Abnormal; Notable for the following components:  ?    Result Value  ? WBC 16.1 (*)   ? Platelets 599 (*)   ? Neutro Abs 11.9 (*)   ? Monocytes Absolute 1.4 (*)   ? Abs Immature Granulocytes  0.14 (*)   ? All other components within normal limits  ?COMPREHENSIVE METABOLIC PANEL - Abnormal; Notable for the following components:  ? Sodium 133 (*)   ? Chloride 96 (*)   ? Glucose, Bld 134 (*)   ? BUN 38 (*)   ? All other components within normal limits  ?LIPASE, BLOOD  ?LACTIC ACID, PLASMA  ?LACTIC ACID, PLASMA  ?TROPONIN I (HIGH SENSITIVITY)  ?TROPONIN I (HIGH SENSITIVITY)  ? ?RADIOLOGY ?ED MD interpretation: 2 view chest x-ray interpreted by me shows no evidence of acute abnormalities including no pneumonia, pneumothorax, or widened mediastinum ?CT of the abdomen and pelvis with IV contrast as interpreted by me shows a contained perforated duodenal ulcer with surrounding duodenitis ?-Agree with radiology assessment ?Official radiology report(s): ?DG Chest 2 View ? ?Result Date: 05/14/2021 ?CLINICAL DATA:  Chest pain EXAM: CHEST - 2 VIEW COMPARISON:  08/03/2017 FINDINGS: The heart size and mediastinal contours are within normal limits. Both lungs are clear. The visualized skeletal structures are unremarkable. IMPRESSION: No active cardiopulmonary disease. Electronically Signed   By: Rolm Baptise M.D.   On: 05/14/2021 20:11  ? ?CT Abdomen Pelvis  W Contrast ? ?Result Date: 05/14/2021 ?CLINICAL DATA:  Epigastric pain. EXAM: CT ABDOMEN AND PELVIS WITH CONTRAST TECHNIQUE: Multidetector CT imaging of the abdomen and pelvis was performed using the standard protocol following bolus administration of intravenous contrast. RADIATION DOSE REDUCTION: This exam was performed according to the departmental dose-optimization program which includes automated exposure control, adjustment of the mA and/or kV according to patient size and/or use of iterative reconstruction technique. CONTRAST:  101m OMNIPAQUE IOHEXOL 300 MG/ML  SOLN COMPARISON:  None. FINDINGS: Lower chest: No acute abnormality. Hepatobiliary: A 7 mm diameter cyst is seen within the medial aspect of the right lobe of the liver. 1.8 cm and 2.0 cm gallstones are  seen within moderately distended gallbladder. There is no evidence of gallbladder wall thickening, pericholecystic inflammation or biliary dilatation. Pancreas: A 9 mm x 7 mm cyst is seen within the head of the pancreas. Spleen: Normal in size without focal abnormality. Adrenals/Urinary Tract: Adrenal glands are unremarkable. Kidneys are normal, without renal calculi, focal lesion, or hydronephrosis. Urinary bladder is partially contracted and otherwise unremarkable. Stomach/Bowel: There is thickening of gastric antrum. The duodenal bulb and proximal duodenum are inflamed. A 2.0 cm x 3.6 cm by 3.4 cm area of air and fluid is seen adjacent to the proximal duodenum, adjacent to the lateral aspect of the gallbladder (axial CT images 23 through 30, CT series 2). An associated small air-fluid level is noted. The appendix is not clearly identified. No evidence of bowel dilatation. Numerous diverticula are seen throughout the sigmoid colon. Vascular/Lymphatic: Aortic atherosclerosis. No enlarged abdominal or pelvic lymph nodes. Reproductive: The uterus is not identified. The bilateral adnexa are unremarkable. Other: No abdominal wall hernia or abnormality. No abdominopelvic ascites. Musculoskeletal: No acute or significant osseous findings. IMPRESSION: 1. Findings consistent with proximal duodenitis with a contained, perforated duodenal ulcer. 2. Cholelithiasis. 3. Sigmoid diverticulosis. 4. Aortic atherosclerosis. Aortic Atherosclerosis (ICD10-I70.0). Electronically Signed   By: TVirgina NorfolkM.D.   On: 05/14/2021 22:47   ?PROCEDURES: ?Critical Care performed: No ?Procedures ?MEDICATIONS ORDERED IN ED: ?Medications  ?fentaNYL (SUBLIMAZE) injection 50 mcg (has no administration in time range)  ?ondansetron (ZOFRAN) injection 4 mg (has no administration in time range)  ?piperacillin-tazobactam (ZOSYN) IVPB 3.375 g (has no administration in time range)  ?sodium chloride 0.9 % bolus 1,000 mL (1,000 mLs Intravenous New  Bag/Given 05/14/21 2154)  ?iohexol (OMNIPAQUE) 300 MG/ML solution 75 mL (75 mLs Intravenous Contrast Given 05/14/21 2219)  ?ketorolac (TORADOL) 30 MG/ML injection 15 mg (15 mg Intravenous Given 05/14/21 2214)  ? ?IMPRESSION / MDM / ASSESSMENT AND PLAN / ED COURSE  ?I reviewed the triage vital signs and the nursing notes. ?             ?               ?Differential diagnosis includes, but is not limited to, pancreatitis, perforated ulcer, ruptured AAA, choledocholithiasis ?The patient is on the cardiac monitor to evaluate for evidence of arrhythmia and/or significant heart rate changes. ?Patient is an 88year old female with the above-stated past medical history presents for midepigastric pain that began earlier today.  Laboratory evaluation significant for leukocytosis of 16.  Patient's CT of the abdomen and pelvis shows evidence of a contained perforated duodenal ulcer. ?Consults: ?General surgery-spoke to Dr. RChristian Mategeneral surgery who agrees to see and assessed patient. ?Hospitalist-spoke to Dr. PPosey Prontowho agrees to accept this patient onto her service ? ?Dispo: Admit to medicine ? ?  ?FINAL CLINICAL IMPRESSION(S) / ED DIAGNOSES  ? ?  Final diagnoses:  ?Duodenal ulcer with perforation ([place])  ?Epigastric pain  ?Nausea  ? ?Rx / DC Orders  ? ?ED Discharge Orders   ? ? None  ? ?  ? ?Note:  This document was prepared using Dragon voice recognition software and may include unintentional dictation errors. ?  ?Naaman Plummer, MD ?05/14/21 2343 ? ?

## 2021-05-15 ENCOUNTER — Encounter: Payer: Self-pay | Admitting: Internal Medicine

## 2021-05-15 DIAGNOSIS — R1013 Epigastric pain: Secondary | ICD-10-CM | POA: Diagnosis present

## 2021-05-15 DIAGNOSIS — I10 Essential (primary) hypertension: Secondary | ICD-10-CM | POA: Diagnosis present

## 2021-05-15 DIAGNOSIS — Z885 Allergy status to narcotic agent status: Secondary | ICD-10-CM | POA: Diagnosis not present

## 2021-05-15 DIAGNOSIS — A419 Sepsis, unspecified organism: Secondary | ICD-10-CM | POA: Diagnosis present

## 2021-05-15 DIAGNOSIS — G8929 Other chronic pain: Secondary | ICD-10-CM | POA: Diagnosis present

## 2021-05-15 DIAGNOSIS — I452 Bifascicular block: Secondary | ICD-10-CM | POA: Diagnosis present

## 2021-05-15 DIAGNOSIS — K265 Chronic or unspecified duodenal ulcer with perforation: Principal | ICD-10-CM

## 2021-05-15 DIAGNOSIS — R109 Unspecified abdominal pain: Secondary | ICD-10-CM | POA: Diagnosis present

## 2021-05-15 DIAGNOSIS — D75839 Thrombocytosis, unspecified: Secondary | ICD-10-CM | POA: Diagnosis present

## 2021-05-15 DIAGNOSIS — M48061 Spinal stenosis, lumbar region without neurogenic claudication: Secondary | ICD-10-CM | POA: Diagnosis present

## 2021-05-15 DIAGNOSIS — D638 Anemia in other chronic diseases classified elsewhere: Secondary | ICD-10-CM | POA: Diagnosis present

## 2021-05-15 DIAGNOSIS — K5900 Constipation, unspecified: Secondary | ICD-10-CM | POA: Diagnosis present

## 2021-05-15 DIAGNOSIS — F419 Anxiety disorder, unspecified: Secondary | ICD-10-CM | POA: Diagnosis present

## 2021-05-15 DIAGNOSIS — K5750 Diverticulosis of both small and large intestine without perforation or abscess without bleeding: Secondary | ICD-10-CM | POA: Diagnosis present

## 2021-05-15 DIAGNOSIS — Z90710 Acquired absence of both cervix and uterus: Secondary | ICD-10-CM | POA: Diagnosis not present

## 2021-05-15 DIAGNOSIS — I2510 Atherosclerotic heart disease of native coronary artery without angina pectoris: Secondary | ICD-10-CM | POA: Diagnosis present

## 2021-05-15 DIAGNOSIS — Z8249 Family history of ischemic heart disease and other diseases of the circulatory system: Secondary | ICD-10-CM | POA: Diagnosis not present

## 2021-05-15 DIAGNOSIS — K5710 Diverticulosis of small intestine without perforation or abscess without bleeding: Secondary | ICD-10-CM | POA: Diagnosis not present

## 2021-05-15 DIAGNOSIS — K2980 Duodenitis without bleeding: Secondary | ICD-10-CM | POA: Diagnosis present

## 2021-05-15 DIAGNOSIS — I951 Orthostatic hypotension: Secondary | ICD-10-CM | POA: Diagnosis not present

## 2021-05-15 DIAGNOSIS — M5432 Sciatica, left side: Secondary | ICD-10-CM | POA: Diagnosis present

## 2021-05-15 DIAGNOSIS — M4807 Spinal stenosis, lumbosacral region: Secondary | ICD-10-CM | POA: Diagnosis present

## 2021-05-15 DIAGNOSIS — E785 Hyperlipidemia, unspecified: Secondary | ICD-10-CM | POA: Diagnosis present

## 2021-05-15 DIAGNOSIS — R1084 Generalized abdominal pain: Secondary | ICD-10-CM | POA: Diagnosis not present

## 2021-05-15 DIAGNOSIS — Z79899 Other long term (current) drug therapy: Secondary | ICD-10-CM | POA: Diagnosis not present

## 2021-05-15 DIAGNOSIS — Z791 Long term (current) use of non-steroidal anti-inflammatories (NSAID): Secondary | ICD-10-CM | POA: Diagnosis not present

## 2021-05-15 DIAGNOSIS — M25551 Pain in right hip: Secondary | ICD-10-CM | POA: Diagnosis present

## 2021-05-15 DIAGNOSIS — K3189 Other diseases of stomach and duodenum: Secondary | ICD-10-CM | POA: Diagnosis not present

## 2021-05-15 HISTORY — DX: Thrombocytosis, unspecified: D75.839

## 2021-05-15 LAB — LACTIC ACID, PLASMA
Lactic Acid, Venous: 1.4 mmol/L (ref 0.5–1.9)
Lactic Acid, Venous: 1.6 mmol/L (ref 0.5–1.9)

## 2021-05-15 LAB — COMPREHENSIVE METABOLIC PANEL
ALT: 23 U/L (ref 0–44)
AST: 19 U/L (ref 15–41)
Albumin: 3.4 g/dL — ABNORMAL LOW (ref 3.5–5.0)
Alkaline Phosphatase: 47 U/L (ref 38–126)
Anion gap: 10 (ref 5–15)
BUN: 34 mg/dL — ABNORMAL HIGH (ref 8–23)
CO2: 22 mmol/L (ref 22–32)
Calcium: 8.9 mg/dL (ref 8.9–10.3)
Chloride: 103 mmol/L (ref 98–111)
Creatinine, Ser: 0.82 mg/dL (ref 0.44–1.00)
GFR, Estimated: >60 mL/min (ref >60)
Glucose, Bld: 105 mg/dL — ABNORMAL HIGH (ref 70–99)
Potassium: 4.4 mmol/L (ref 3.5–5.1)
Sodium: 135 mmol/L (ref 135–145)
Total Bilirubin: 0.5 mg/dL (ref 0.3–1.2)
Total Protein: 6.3 g/dL — ABNORMAL LOW (ref 6.5–8.1)

## 2021-05-15 LAB — CBC
HCT: 32.5 % — ABNORMAL LOW (ref 36.0–46.0)
Hemoglobin: 10.5 g/dL — ABNORMAL LOW (ref 12.0–15.0)
MCH: 29.3 pg (ref 26.0–34.0)
MCHC: 32.3 g/dL (ref 30.0–36.0)
MCV: 90.8 fL (ref 80.0–100.0)
Platelets: 501 10*3/uL — ABNORMAL HIGH (ref 150–400)
RBC: 3.58 MIL/uL — ABNORMAL LOW (ref 3.87–5.11)
RDW: 13.8 % (ref 11.5–15.5)
WBC: 15.8 10*3/uL — ABNORMAL HIGH (ref 4.0–10.5)
nRBC: 0 % (ref 0.0–0.2)

## 2021-05-15 LAB — MAGNESIUM: Magnesium: 2.7 mg/dL — ABNORMAL HIGH (ref 1.7–2.4)

## 2021-05-15 MED ORDER — FENTANYL CITRATE PF 50 MCG/ML IJ SOSY
50.0000 ug | PREFILLED_SYRINGE | INTRAMUSCULAR | Status: DC | PRN
  Administered 2021-05-15: 50 ug via INTRAVENOUS
  Filled 2021-05-15: qty 1

## 2021-05-15 MED ORDER — HYDROMORPHONE HCL 1 MG/ML IJ SOLN: 1.0000 mg | INTRAMUSCULAR | Status: DC | PRN

## 2021-05-15 MED ORDER — METHOCARBAMOL 750 MG PO TABS
750.0000 mg | ORAL_TABLET | Freq: Once | ORAL | Status: AC
  Administered 2021-05-15: 750 mg via ORAL
  Filled 2021-05-15 (×2): qty 1

## 2021-05-15 MED ORDER — SODIUM CHLORIDE 0.9% FLUSH
3.0000 mL | Freq: Two times a day (BID) | INTRAVENOUS | Status: DC
  Administered 2021-05-15 – 2021-05-24 (×17): 3 mL via INTRAVENOUS

## 2021-05-15 MED ORDER — PANTOPRAZOLE SODIUM 40 MG IV SOLR
40.0000 mg | Freq: Two times a day (BID) | INTRAVENOUS | Status: DC
  Administered 2021-05-15 – 2021-05-24 (×20): 40 mg via INTRAVENOUS
  Filled 2021-05-15 (×20): qty 10

## 2021-05-15 MED ORDER — HYDRALAZINE HCL 20 MG/ML IJ SOLN
10.0000 mg | Freq: Three times a day (TID) | INTRAMUSCULAR | Status: DC
  Administered 2021-05-15 – 2021-05-20 (×16): 10 mg via INTRAVENOUS
  Filled 2021-05-15 (×17): qty 1

## 2021-05-15 MED ORDER — HYDROMORPHONE HCL 1 MG/ML IJ SOLN
0.5000 mg | Freq: Once | INTRAMUSCULAR | Status: AC
  Administered 2021-05-15: 0.5 mg via INTRAVENOUS
  Filled 2021-05-15: qty 1

## 2021-05-15 MED ORDER — AMLODIPINE BESYLATE 5 MG PO TABS
5.0000 mg | ORAL_TABLET | Freq: Every day | ORAL | Status: DC
  Administered 2021-05-15 – 2021-05-24 (×10): 5 mg via ORAL
  Filled 2021-05-15 (×10): qty 1

## 2021-05-15 MED ORDER — HYDRALAZINE HCL 20 MG/ML IJ SOLN: 10.0000 mg | Freq: Four times a day (QID) | INTRAMUSCULAR | Status: DC | PRN

## 2021-05-15 MED ORDER — HYDROMORPHONE HCL 1 MG/ML IJ SOLN
1.0000 mg | INTRAMUSCULAR | Status: DC | PRN
  Administered 2021-05-15: 1 mg via INTRAVENOUS
  Filled 2021-05-15: qty 1

## 2021-05-15 MED ORDER — OXYCODONE HCL 5 MG PO TABS
5.0000 mg | ORAL_TABLET | Freq: Four times a day (QID) | ORAL | Status: DC | PRN
  Administered 2021-05-15 – 2021-05-16 (×3): 5 mg via ORAL
  Filled 2021-05-15 (×4): qty 1

## 2021-05-15 MED ORDER — HYDROMORPHONE HCL 1 MG/ML IJ SOLN
0.5000 mg | INTRAMUSCULAR | Status: DC | PRN
  Administered 2021-05-15 – 2021-05-17 (×8): 0.5 mg via INTRAVENOUS
  Filled 2021-05-15 (×8): qty 1

## 2021-05-15 MED ORDER — ACETAMINOPHEN 325 MG PO TABS
650.0000 mg | ORAL_TABLET | Freq: Four times a day (QID) | ORAL | Status: DC | PRN
  Administered 2021-05-16 – 2021-05-18 (×2): 650 mg via ORAL
  Filled 2021-05-15 (×2): qty 2

## 2021-05-15 MED ORDER — SODIUM CHLORIDE 0.9% FLUSH: 3.0000 mL | INTRAVENOUS | Status: DC | PRN

## 2021-05-15 MED ORDER — BLISTEX MEDICATED EX OINT
TOPICAL_OINTMENT | CUTANEOUS | Status: DC | PRN
  Filled 2021-05-15: qty 6.3

## 2021-05-15 MED ORDER — SODIUM CHLORIDE 0.9 % IV SOLN
2.0000 g | Freq: Two times a day (BID) | INTRAVENOUS | Status: DC
  Administered 2021-05-15 – 2021-05-20 (×11): 2 g via INTRAVENOUS
  Filled 2021-05-15 (×12): qty 2

## 2021-05-15 MED ORDER — SODIUM CHLORIDE 0.9 % IV SOLN: INTRAVENOUS | Status: DC

## 2021-05-15 MED ORDER — SODIUM CHLORIDE 0.9 % IV SOLN: 250.0000 mL | INTRAVENOUS | Status: DC | PRN

## 2021-05-15 NOTE — ED Notes (Signed)
Pt. States she is much more comfortable than she was previously. NAD, denies further need. ?

## 2021-05-15 NOTE — ED Notes (Signed)
Informed RN bed assigned 

## 2021-05-15 NOTE — ED Notes (Signed)
Patient moved to room 34. Assuming care after receiving report from [place]. VS assessed. Pt rating pain 3/10. C/o feeling slightly nauseated. Instructed patient on use of call light.  ?

## 2021-05-15 NOTE — Consult Note (Signed)
[facility] ?SURGICAL CONSULTATION NOTE (initial) - cpt: [id number]) ? ? ?HISTORY OF PRESENT ILLNESS (HPI):  ?88 y.o. female presented to [facility] ED today for evaluation of epigastric abdominal pain. Patient reports her pain began prior to her recent discharge earlier this week, when the pain of her spinal stenosis was treated, she was subsequently discharged on meloxicam.   She denies prior treatment for ulcers, or GERD.  She developed constipation and took laxatives just prior and reportedly this resolved after coming to the ED.  She still c/o pain to the epigastric region.  She denies any history of nausea, diarrhea, shortness of breath, chest pain, abdominal pain or vomiting.  No history of GERD or melena. ? ?Surgery is consulted by ED physician Dr. Cheri Fowler in this context for evaluation and management of contained perforated duodenal ulcer, as identified on CT scan.. ? ?PAST MEDICAL HISTORY (PMH):  ?Past Medical History:  ?Diagnosis Date  ? Actinic keratosis   ? Hypertension   ?  ? ?PAST SURGICAL HISTORY ([place]):  ?Past Surgical History:  ?Procedure Laterality Date  ? BACK SURGERY    ? IR INJECT/THERA/INC NEEDLE/CATH/PLC EPI/LUMB/SAC W/IMG  04/29/2021  ? MOHS SURGERY    ?  ? ?MEDICATIONS:  ?Prior to Admission medications   ?Medication Sig Start Date End Date Taking? Authorizing Provider  ?acetaminophen (TYLENOL) 500 MG tablet Take 500-1,000 mg by mouth every 6 (six) hours as needed for mild pain or fever.    [provider]  ?amLODipine (NORVASC) 10 MG tablet Take 1 tablet (10 mg total) by mouth daily. 05/06/21   Fritzi Mandes, MD  ?Biotin 5 MG TABS Take 5 mg by mouth daily.    [provider]  ?cholecalciferol (VITAMIN D) 25 MCG (1000 UNIT) tablet Take 2,000 Units by mouth daily.    [provider]  ?Cyanocobalamin (B-12) 1000 MCG CAPS Take 1,000 mcg by mouth daily.    [provider]  ?diclofenac Sodium (VOLTAREN) 1 % GEL Apply 2 g topically every 6 (six) hours as needed  (pain). 05/06/21   Fritzi Mandes, MD  ?famotidine (PEPCID) 20 MG tablet Take 1 tablet (20 mg total) by mouth daily. 05/06/21   Fritzi Mandes, MD  ?gabapentin (NEURONTIN) 100 MG capsule Take 1 capsule (100 mg total) by mouth at bedtime. 05/06/21   Fritzi Mandes, MD  ?meloxicam (MOBIC) 15 MG tablet Take 1 tablet (15 mg total) by mouth daily. 05/06/21   Fritzi Mandes, MD  ?olmesartan-hydrochlorothiazide (BENICAR HCT) 40-12.5 MG tablet Take 1 tablet by mouth daily. 02/11/21   [provider]  ?oxyCODONE 10 MG TABS Take 1 tablet (10 mg total) by mouth every 8 (eight) hours as needed for severe pain. 05/06/21   Fritzi Mandes, MD  ?polyethylene glycol (MIRALAX / GLYCOLAX) 17 g packet Take 17 g by mouth daily. 02/18/20   Shelly Coss, MD  ?  ? ?ALLERGIES:  ?Allergies  ?Allergen Reactions  ? Codeine   ? Morphine And Related Other (See Comments)  ?  Respiratory Distress ?  ? Percocet [Oxycodone-Acetaminophen]   ?  ? ?SOCIAL HISTORY:  ?Social History  ? ?Socioeconomic History  ? Marital status: Married  ?  Spouse name: Not on file  ? Number of children: Not on file  ? Years of education: Not on file  ? Highest education level: Not on file  ?Occupational History  ? Not on file  ?Tobacco Use  ? Smoking status: Never  ? Smokeless tobacco: Never  ?Substance and Sexual Activity  ? Alcohol  use: Yes  ?  Comment: Occassionally  ? Drug use: No  ? Sexual activity: Not on file  ?Other Topics Concern  ? Not on file  ?Social History Narrative  ? Not on file  ? ?Social Determinants of Health  ? ?Financial Resource Strain: Not on file  ?Food Insecurity: Not on file  ?Transportation Needs: Not on file  ?Physical Activity: Not on file  ?Stress: Not on file  ?Social Connections: Not on file  ?Intimate Partner Violence: Not on file  ?  ? ?FAMILY HISTORY:  ?Family History  ?Problem Relation Age of Onset  ? Hypertension Father   ?  ? ? ?REVIEW OF SYSTEMS:  ?Review of Systems  ?All other systems reviewed and are negative. ? ?VITAL SIGNS:  ?Temp:  [98.6 ?F  (37 ?C)] 98.6 ?F (62 ?C) (03/16 1947) ?Pulse Rate:  [72-91] 79 (03/16 2300) ?Resp:  [18-20] 19 (03/16 2300) ?BP: (157-183)/(57-70) 183/65 (03/16 2300) ?SpO2:  [94 %-99 %] 99 % (03/16 2300) ?Weight:  [59 kg] 59 kg (03/16 1947)     Height: '5\' 3"'$  (160 cm) Weight: 59 kg BMI (Calculated): 23.03  ? ?INTAKE/OUTPUT:  ?03/16 0701 - 03/17 0700 ?In: 1041.9 [IV Piggyback:1041.9] ?Out: -  ? ?PHYSICAL EXAM:  ?Physical Exam ?Blood pressure (!) 183/65, pulse 79, temperature 98.6 ?F (37 ?C), temperature source Oral, resp. rate 19, height '5\' 3"'$  (1.6 m), weight 59 kg, SpO2 99 %. ?Last Weight  Most recent update: 05/14/2021  7:48 PM  ? ? Weight  ?59 kg (130 lb)  ?      ? ?  ? ? ?CONSTITUTIONAL: Well developed, and nourished, appropriately responsive and aware without distress.   ?EYES: Sclera non-icteric.   ?EARS, NOSE, MOUTH AND THROAT: The oropharynx is clear. Oral mucosa is pink and moist.    Hearing is intact to voice.  ?NECK: Trachea is midline, and there is no jugular venous distension.  ?LYMPH NODES:  Lymph nodes in the neck are not enlarged. ?RESPIRATORY:  Lungs are clear, and breath sounds are equal bilaterally. Normal respiratory effort without pathologic use of accessory muscles. ?CARDIOVASCULAR: Heart is regular in rate and rhythm. ?GI: The abdomen is tender in the epigastrium, but otherwise soft, nontender, and nondistended. There were no palpable masses. I did not appreciate hepatosplenomegaly. There were bowel sounds. ?MUSCULOSKELETAL:  No major deformities in extremities.    ?SKIN: Skin turgor is normal. No pathologic skin lesions appreciated.  ?NEUROLOGIC:  Motor and sensation appear grossly normal.  Cranial nerves are grossly without defect. ?PSYCH:  Alert and oriented to person, place and time. Affect is appropriate for situation. ? ?Data Reviewed ?I have personally reviewed what is currently available of the patient's imaging, recent labs and medical records.   ? ?Labs:  ?CBC Latest Ref Rng & Units 05/14/2021 04/29/2021  04/28/2021  ?WBC 4.0 - 10.5 K/uL 16.1(H) 10.5 11.5(H)  ?Hemoglobin 12.0 - 15.0 g/dL 12.1 12.1 12.9  ?Hematocrit 36.0 - 46.0 % 37.0 37.4 39.7  ?Platelets 150 - 400 K/uL 599(H) 266 272  ? ?CMP Latest Ref Rng & Units 05/14/2021 04/29/2021 04/28/2021  ?Glucose 70 - 99 mg/dL 134(H) 106(H) 118(H)  ?BUN 8 - 23 mg/dL 38(H) 30(H) 35(H)  ?Creatinine 0.44 - 1.00 mg/dL 0.68 0.69 0.71  ?Sodium 135 - 145 mmol/L 133(L) 139 139  ?Potassium 3.5 - 5.1 mmol/L 4.4 3.8 3.6  ?Chloride 98 - 111 mmol/L 96(L) 106 106  ?CO2 22 - 32 mmol/L '24 26 24  '$ ?Calcium 8.9 - 10.3 mg/dL 10.0 9.1  9.2  ?Total Protein 6.5 - 8.1 g/dL 7.4 - -  ?Total Bilirubin 0.3 - 1.2 mg/dL 0.3 - -  ?Alkaline Phos 38 - 126 U/L 64 - -  ?AST 15 - 41 U/L 22 - -  ?ALT 0 - 44 U/L 29 - -  ? ? ? ?Imaging studies:  ?Last 24 hrs: DG Chest 2 View ? ?Result Date: 05/14/2021 ?CLINICAL DATA:  Chest pain EXAM: CHEST - 2 VIEW COMPARISON:  08/03/2017 FINDINGS: The heart size and mediastinal contours are within normal limits. Both lungs are clear. The visualized skeletal structures are unremarkable. IMPRESSION: No active cardiopulmonary disease. Electronically Signed   By: Rolm Baptise M.D.   On: 05/14/2021 20:11  ? ?CT Abdomen Pelvis W Contrast ? ?Result Date: 05/14/2021 ?CLINICAL DATA:  Epigastric pain. EXAM: CT ABDOMEN AND PELVIS WITH CONTRAST TECHNIQUE: Multidetector CT imaging of the abdomen and pelvis was performed using the standard protocol following bolus administration of intravenous contrast. RADIATION DOSE REDUCTION: This exam was performed according to the departmental dose-optimization program which includes automated exposure control, adjustment of the mA and/or kV according to patient size and/or use of iterative reconstruction technique. CONTRAST:  7m OMNIPAQUE IOHEXOL 300 MG/ML  SOLN COMPARISON:  None. FINDINGS: Lower chest: No acute abnormality. Hepatobiliary: A 7 mm diameter cyst is seen within the medial aspect of the right lobe of the liver. 1.8 cm and 2.0 cm gallstones are  seen within moderately distended gallbladder. There is no evidence of gallbladder wall thickening, pericholecystic inflammation or biliary dilatation. Pancreas: A 9 mm x 7 mm cyst is seen within the

## 2021-05-15 NOTE — ED Notes (Signed)
Dr. Christian Mate discussed extensively with pt. And spouse, stating pt. Is not a good candidate for surgery. Dr. Christian Mate discussed extensively disead ?

## 2021-05-15 NOTE — ED Notes (Signed)
Assisted pt to bathroom via wheel chair. Pt had large BM and was c/o abd pain and stated she felt as if she was going to faint. Pt requested "something for pain and nausea" [place] notified. Placed new brief on pt and assisted pt back to bed. Blankets provided and call bell placed within reach.  ?

## 2021-05-15 NOTE — Progress Notes (Addendum)
[facility] ?SURGICAL PROGRESS NOTE (cpt [phone]) ? ?Hospital Day(s): 0.  ? ?Post op day(s):  .  ? ?Interval History: Patient seen and examined, no acute events or new complaints overnight. Patient reports a desire for better pain control, denies vomiting.  Interviewed patient and examined with husband at bedside.  Long discussion regarding the presumed etiology of the perforated peptic ulcer disease, likely the duration of its presence considering the week plus of pain in this area.  We discussed the pros and cons of proceeding aggressively with surgery, and have good confidence in its containment based on CT imaging.  We discussed the pros and cons of nasogastric decompression/drainage.  And strategies of utilizing maximal PPI acid control, and antibiotics. ? ?Review of Systems:  ?Constitutional: denies fever, chills  ?HEENT: denies cough or congestion  ?Respiratory: denies any shortness of breath  ?Cardiovascular: denies chest pain or palpitations  ?Gastrointestinal: denies N/V, or diarrhea/and bowel function as per interval history ?Genitourinary: denies burning with urination or urinary frequency ?Musculoskeletal: denies pain, decreased motor or sensation ?Integumentary: denies any other rashes or skin discolorations ?Neurological: denies HA or vision/hearing changes  ? ?Vital signs in last 24 hours: [min-max] current  ?Temp:  [98.6 ?F (37 ?C)] 98.6 ?F (49 ?C) (03/16 1947) ?Pulse Rate:  [72-97] 97 (03/17 0747) ?Resp:  [15-21] 18 (03/17 0747) ?BP: (138-183)/(43-70) 138/52 (03/17 0747) ?SpO2:  [94 %-99 %] 98 % (03/17 0747) ?Weight:  [59 kg] 59 kg (03/16 1947)     Height: '5\' 3"'$  (160 cm) Weight: 59 kg BMI (Calculated): 23.03  ? ?Intake/Output last 2 shifts:  ?03/16 0701 - 03/17 0700 ?In: 1041.9 [IV Piggyback:1041.9] ?Out: -   ? ?Physical Exam:  ?Constitutional: alert, cooperative and no distress  ?HENT: normocephalic without obvious abnormality  ?Eyes: PERRL, EOM's grossly intact and symmetric   ?Neuro: CN II - XII grossly intact and symmetric without deficit  ?Respiratory: breathing non-labored at rest  ?Cardiovascular: regular rate and sinus rhythm  ?Gastrointestinal: Unchanged from last night still with epigastric tenderness, but no evidence of any peritonitic findings, soft, non-tender, and non-distended.  ?Musculoskeletal: no edema or wounds, motor and sensation grossly intact, NT  ? ? ?Labs:  ?CBC Latest Ref Rng & Units 05/15/2021 05/14/2021 04/29/2021  ?WBC 4.0 - 10.5 K/uL 15.8(H) 16.1(H) 10.5  ?Hemoglobin 12.0 - 15.0 g/dL 10.5(L) 12.1 12.1  ?Hematocrit 36.0 - 46.0 % 32.5(L) 37.0 37.4  ?Platelets 150 - 400 K/uL 501(H) 599(H) 266  ? ?CMP Latest Ref Rng & Units 05/15/2021 05/14/2021 04/29/2021  ?Glucose 70 - 99 mg/dL 105(H) 134(H) 106(H)  ?BUN 8 - 23 mg/dL 34(H) 38(H) 30(H)  ?Creatinine 0.44 - 1.00 mg/dL 0.82 0.68 0.69  ?Sodium 135 - 145 mmol/L 135 133(L) 139  ?Potassium 3.5 - 5.1 mmol/L 4.4 4.4 3.8  ?Chloride 98 - 111 mmol/L 103 96(L) 106  ?CO2 22 - 32 mmol/L '22 24 26  '$ ?Calcium 8.9 - 10.3 mg/dL 8.9 10.0 9.1  ?Total Protein 6.5 - 8.1 g/dL 6.3(L) 7.4 -  ?Total Bilirubin 0.3 - 1.2 mg/dL 0.5 0.3 -  ?Alkaline Phos 38 - 126 U/L 47 64 -  ?AST 15 - 41 U/L 19 22 -  ?ALT 0 - 44 U/L 23 29 -  ? ? ? ?Imaging studies: No new pertinent imaging studies ? ? ?Assessment/Plan:  ?88 y.o. female with likely contained perforated DU, complicated by challenges of pain control for her spinal stenosis. ?Patient Active Problem List  ? Diagnosis Date Noted  ? Abdominal pain 05/15/2021  ?  Sepsis ([place]) 05/15/2021  ? Spinal stenosis at L4-L5 level 04/27/2021  ? Intractable low back pain 04/27/2021  ? Back pain 04/26/2021  ? Low back pain 04/26/2021  ? Closed C1 fracture ([place]) 02/12/2020  ? Neck fracture ([place]) 02/11/2020  ? Coronary artery calcification seen on CAT scan 05/05/2017  ? Chest pain with moderate risk for cardiac etiology 05/05/2017  ? Shortness of breath 05/05/2017  ? Hyperlipidemia 09/25/2016  ? Neuropathy 09/25/2016  ?  Statin intolerance 09/25/2016  ? Anxiety 09/25/2016  ? Palpitations 09/25/2016  ? Benign essential hypertension 06/30/2016  ? ?  ?            - Bowel rest/NPO, may have meds with sips ?            - PPI IV BID / (d/c use of NSAIDs).  ?            - Continue with Maxipime coverage.   ?            - DVT prophylaxis ?            - Serial exams, anticipate contrast study in 3 days.  ?            - We'll follow with you.   ? ? ?All of the above findings and recommendations were discussed with the patient, patient's family, and the medical team, and all of patient's and family's questions were answered to their expressed satisfaction. ? ? ?-- ?Ronny Bacon M.D., FACS ?05/15/2021 8:34 AM  ?

## 2021-05-15 NOTE — Assessment & Plan Note (Addendum)
Likely acute phase reactant given abdominal issues.  Platelet count now normalized. ?

## 2021-05-15 NOTE — Assessment & Plan Note (Deleted)
Patient status post nuclear myocardial perfusion imaging study results as below: ?Exercise myocardial perfusion imaging study with no significant  ischemia ?Normal wall motion, EF estimated a 97% ?No EKG changes concerning for ischemia at peak stress or in recovery. ?Target heart rate achieved, exercised for 4.6 METs ?Low risk scan ?EKG today shows sinus rhythm with Right bundle branch block and Left anterior fascicular block ? ?

## 2021-05-15 NOTE — Progress Notes (Signed)
Cross Cover ?Dose of robaxin ordered for back pain not reliieved with current pain regimen ? ?

## 2021-05-15 NOTE — Assessment & Plan Note (Addendum)
Initially placed on oxycodone 10 mg every 4 hours as needed and as needed IV Dilaudid.  Abdominal pain has since resolved.  Patient tolerating p.o.  Now more pain is related to her left leg in relation to sciatica.  See below. ?

## 2021-05-15 NOTE — Assessment & Plan Note (Deleted)
Blood pressure (!) 183/65, pulse 79, temperature 98.6 ?F (37 ?C), temperature source Oral, resp. rate 19, height '5\' 3"'$  (1.6 m), weight 59 kg, SpO2 99 %. ?home meds held as pt is npo.\hydraalzine 10 mg iv q8 sch.  ? ?

## 2021-05-15 NOTE — ED Notes (Signed)
Pt. States she is in unbearable pain, Dr. Leslye Peer notified, awaiting orders. ?

## 2021-05-15 NOTE — ED Notes (Signed)
Pt. Transferred to hospital bed for comfort.

## 2021-05-15 NOTE — Assessment & Plan Note (Deleted)
General surgery ordered a repeat CAT scan.  Case discussed with Dr. Hampton Abbot general surgery today.  NPO.  IV fluid hydration.  IV Protonix twice daily.  Patient complaining of a lot of pain and it takes time for the nursing staff to get in to treat her pain.  I talked her about potentially doing a PCA pump which she will think about.  Continue oral and IV pain medications.  General surgery changed the Dilaudid to every 2 hours as needed. ?

## 2021-05-15 NOTE — Progress Notes (Signed)
Admission profile updated. ?

## 2021-05-15 NOTE — Assessment & Plan Note (Addendum)
Initially had been Norvasc and Benicar substitute (Avapro) and hydrochlorothiazide.  Blood pressures have been trending upward.  BNP within normal limits.  Likely in part due to pain.   We will add hydralazine as needed.  With orthostatic hypotension, have stopped diuretics and blood pressure medications changed as above. ?

## 2021-05-15 NOTE — Assessment & Plan Note (Addendum)
Ruled out.  On empiric antibiotics secondary to gastric ulcer perforation. ?

## 2021-05-15 NOTE — ED Notes (Signed)
Gen surg at bedside

## 2021-05-15 NOTE — Assessment & Plan Note (Addendum)
Pt advised to avoid all NSAIDS and take non nsaid based pain control with tylenol.  Currently on pain medications for duodenal perforation.  Added gabapentin back.  Physical therapy consultation. ? ?

## 2021-05-15 NOTE — TOC Initial Note (Signed)
Transition of Care (TOC) - Initial/Assessment Note  ? ? ?Patient Details  ?Name: Sherry [name] ?MRN: [id number] ?Date of Birth: 23-Apr-1930 ? ?Transition of Care (TOC) CM/SW Contact:    ?Pete Pelt, RN ?Phone Number: ?05/15/2021, 3:19 PM ? ?Clinical Narrative:   Patient lives at home with husband.  She states husband assists her with care as needed  She has no transportation issues, does not have home health currently and has no concerns about medications.              ? ? ?Expected Discharge Plan:  (TBD) ?Barriers to Discharge: Barriers Resolved ? ? ?Patient Goals and CMS Choice ?  ?  ?Choice offered to / list presented to : NA ? ?Expected Discharge Plan and Services ?Expected Discharge Plan:  (TBD) ?  ?Discharge Planning Services: CM Consult ?  ?Living arrangements for the past 2 months: [place] ?                ?  ?  ?  ?  ?  ?  ?  ?  ?  ?  ? ?Prior Living Arrangements/Services ?Living arrangements for the past 2 months: [place] ?Lives with:: Spouse, Self ?Patient language and need for interpreter reviewed:: Yes (no translator required) ?Do you feel safe going back to the place where you live?: Yes      ?Need for Family Participation in Patient Care: Yes (Comment) ?  ?  ?Criminal Activity/Legal Involvement Pertinent to Current Situation/Hospitalization: No - Comment as needed ? ?Activities of Daily Living ?Home Assistive Devices/Equipment: [place] Rile (specify type) ?ADL Screening (condition at time of admission) ?Patient's cognitive ability adequate to safely complete daily activities?: Yes ?Is the patient deaf or have difficulty hearing?: No ?Does the patient have difficulty seeing, even when wearing glasses/contacts?: No ?Does the patient have difficulty concentrating, remembering, or making decisions?: No ?Patient able to express need for assistance with ADLs?: Yes ?Does the patient have difficulty dressing or bathing?: No ?Independently performs ADLs?: Yes (appropriate for developmental  age) ?Does the patient have difficulty walking or climbing stairs?: No ?Weakness of Legs: None ?Weakness of Arms/Hands: None ? ?Permission Sought/Granted ?Permission sought to share information with : Case Manager ?Permission granted to share information with : Yes, Verbal Permission Granted ?   ? Permission granted to share info w AGENCY: pending dispostion determination ?   ?   ? ?Emotional Assessment ?Appearance:: Appears stated age ?Attitude/Demeanor/Rapport: Lethargic ?Affect (typically observed): Pleasant, Appropriate ?Orientation: : Oriented to Self, Oriented to Place, Oriented to  Time, Oriented to Situation ?Alcohol / Substance Use: Not Applicable ?Psych Involvement: No (comment) ? ?Admission diagnosis:  Duodenal ulcer with perforation ([place]) [K26.5] ?Nausea [R11.0] ?Epigastric pain [R10.13] ?Abdominal pain [R10.9] ?Patient Active Problem List  ? Diagnosis Date Noted  ? Abdominal pain 05/15/2021  ? Sepsis ([place]) 05/15/2021  ? Spinal stenosis at L4-L5 level 04/27/2021  ? Intractable low back pain 04/27/2021  ? Back pain 04/26/2021  ? Low back pain 04/26/2021  ? Closed C1 fracture ([place]) 02/12/2020  ? Neck fracture ([place]) 02/11/2020  ? Coronary artery calcification seen on CAT scan 05/05/2017  ? Chest pain with moderate risk for cardiac etiology 05/05/2017  ? Shortness of breath 05/05/2017  ? Hyperlipidemia 09/25/2016  ? Neuropathy 09/25/2016  ? Statin intolerance 09/25/2016  ? Anxiety 09/25/2016  ? Palpitations 09/25/2016  ? Benign essential hypertension 06/30/2016  ? ?PCP:  Rusty Aus, MD ?Pharmacy:   ?[place], [place] -  [address] ?[address] ?[place] [address] ?Phone: [phone] Fax: [phone] ? ?[place], [place] [address] ?[address] ?[place] [address] ?Phone: [phone] Fax: [phone] ? ? ? ? ?Social Determinants of Health (SDOH) Interventions ?  ? ?Readmission Risk Interventions ?No flowsheet  data found. ? ? ?

## 2021-05-15 NOTE — Progress Notes (Signed)
?  Progress Note ? ? ?Patient: Sherry [name] TZG:[id number] DOB: 24-May-1930 DOA: 05/14/2021     0 ?DOS: the patient was seen and examined on 05/15/2021 ?  ? ? ?Assessment and Plan: ?* Perforated duodenal ulcer ([place]) ?Appreciate general surgery consultation.  NPO.  IV fluid hydration.  IV Protonix twice daily.  Perforation seems to be contained in general surgery wants to watch things medically at this point. ? ?Abdominal pain ?Called to see the patient for severe abdominal pain this morning.  I added IV diet Dilaudid and oral oxycodone to try to control the patient's pain better. ? ?Sepsis ([place]) ?Ruled out.  On empiric antibiotics secondary to duodenal ulcer perforation. ? ?Essential hypertension ?Restart Norvasc and hold on the blood pressure medications ? ?Spinal stenosis at L4-L5 level ?Pt advised to avoid all NSAIDS and take non nsaid based pain control with tylenol.  Currently not on pain medications for duodenal perforation. ? ? ?Thrombocytosis ?Likely acute phase reactant with perforated duodenal ulcer. ? ? ? ? ?  ? ?Subjective: Called to see the patient this morning for secondary to severe abdominal pain.  With all her allergies to pain medications added, evaluated to see what she can take.  She was able to take Dilaudid and oral oxycodone. ? ?Physical Exam: ?Vitals:  ? 05/15/21 1127 05/15/21 1419 05/15/21 1431 05/15/21 1500  ?BP: (!) 148/50 (!) 167/48    ?Pulse: 82     ?Resp: '16  18 17  '$ ?Temp: 97.8 ?F (36.6 ?C)     ?TempSrc: Oral     ?SpO2: 99%     ?Weight:      ?Height:      ? ?Physical Exam ?HENT:  ?   Head: Normocephalic.  ?   Mouth/Throat:  ?   Pharynx: No oropharyngeal exudate.  ?Eyes:  ?   General: Lids are normal.  ?   Conjunctiva/sclera: Conjunctivae normal.  ?Cardiovascular:  ?   Rate and Rhythm: Normal rate and regular rhythm.  ?   Heart sounds: Normal heart sounds, S1 normal and S2 normal.  ?Pulmonary:  ?   Breath sounds: Examination of the right-lower field reveals decreased breath sounds.  Examination of the left-lower field reveals decreased breath sounds. Decreased breath sounds present. No wheezing, rhonchi or rales.  ?Abdominal:  ?   General: There is distension.  ?   Palpations: Abdomen is soft.  ?   Tenderness: There is abdominal tenderness.  ?Musculoskeletal:  ?   Right lower leg: No swelling.  ?   Left lower leg: No swelling.  ?Skin: ?   General: Skin is warm.  ?   Findings: No rash.  ?Neurological:  ?   Mental Status: She is alert and oriented to person, place, and time.  ?  ?Data Reviewed: ?CT scan reviewed with perforation of duodenal ulcer, white blood cell count elevated 15.8, hemoglobin 10.5 and platelet count 501 ? ?Family Communication: Spoke with husband on phone ? ?Disposition: ?Status is: Inpatient ?Remains inpatient appropriate because: Patient has a perforated duodenal ulcer and likely will be here in the hospital for a while.  General surgery wants to wait and repeat a CAT scan and 3 days. ? ?Planned Discharge Destination: Home ? ?Author: ?Loletha Grayer, MD ?05/15/2021 4:07 PM ? ?For on call review [email or web address].  ?

## 2021-05-15 NOTE — Progress Notes (Signed)
Pharmacy Antibiotic Note ? ?Sherry [name] is a 88 y.o. female admitted on 05/14/2021 with sepsis.  Pharmacy has been consulted for Cefepime dosing. ? ?Plan: ?Cefepime 2 gm IV Q12H to start 3/17 @ 0800.  ? ?Height: '5\' 3"'$  (160 cm) ?Weight: 59 kg (130 lb) ?IBW/kg (Calculated) : 52.4 ? ?Temp (24hrs), Avg:98.6 ?F (37 ?C), Min:98.6 ?F (37 ?C), Max:98.6 ?F (37 ?C) ? ?Recent Labs  ?Lab 05/14/21 ?1958 05/14/21 ?2358  ?WBC 16.1*  --   ?CREATININE 0.68  --   ?LATICACIDVEN  --  1.6  ?  ?Estimated Creatinine Clearance: 40.2 mL/min (by C-G formula based on SCr of 0.68 mg/dL).   ? ?Allergies  ?Allergen Reactions  ? Codeine   ? Morphine And Related Other (See Comments)  ?  Respiratory Distress ?  ? Percocet [Oxycodone-Acetaminophen]   ? ? ?Antimicrobials this admission: ?  >>  ?  >>  ? ?Dose adjustments this admission: ? ? ?Microbiology results: ? BCx:  ? UCx:   ? Sputum:   ? MRSA PCR:  ? ?Thank you for allowing pharmacy to be a part of this patient?s care. ? ?Lanea Vankirk D ?05/15/2021 1:15 AM ? ?

## 2021-05-16 DIAGNOSIS — D649 Anemia, unspecified: Secondary | ICD-10-CM

## 2021-05-16 DIAGNOSIS — K265 Chronic or unspecified duodenal ulcer with perforation: Secondary | ICD-10-CM | POA: Diagnosis not present

## 2021-05-16 DIAGNOSIS — R1013 Epigastric pain: Secondary | ICD-10-CM | POA: Diagnosis not present

## 2021-05-16 DIAGNOSIS — M48061 Spinal stenosis, lumbar region without neurogenic claudication: Secondary | ICD-10-CM | POA: Diagnosis not present

## 2021-05-16 DIAGNOSIS — I10 Essential (primary) hypertension: Secondary | ICD-10-CM | POA: Diagnosis not present

## 2021-05-16 DIAGNOSIS — D638 Anemia in other chronic diseases classified elsewhere: Secondary | ICD-10-CM | POA: Diagnosis present

## 2021-05-16 LAB — BASIC METABOLIC PANEL
Anion gap: 10 (ref 5–15)
BUN: 22 mg/dL (ref 8–23)
CO2: 22 mmol/L (ref 22–32)
Calcium: 8.8 mg/dL — ABNORMAL LOW (ref 8.9–10.3)
Chloride: 103 mmol/L (ref 98–111)
Creatinine, Ser: 0.68 mg/dL (ref 0.44–1.00)
GFR, Estimated: >60 mL/min (ref >60)
Glucose, Bld: 116 mg/dL — ABNORMAL HIGH (ref 70–99)
Potassium: 3.8 mmol/L (ref 3.5–5.1)
Sodium: 135 mmol/L (ref 135–145)

## 2021-05-16 LAB — CBC
HCT: 33.7 % — ABNORMAL LOW (ref 36.0–46.0)
Hemoglobin: 10.6 g/dL — ABNORMAL LOW (ref 12.0–15.0)
MCH: 28.8 pg (ref 26.0–34.0)
MCHC: 31.5 g/dL (ref 30.0–36.0)
MCV: 91.6 fL (ref 80.0–100.0)
Platelets: 438 10*3/uL — ABNORMAL HIGH (ref 150–400)
RBC: 3.68 MIL/uL — ABNORMAL LOW (ref 3.87–5.11)
RDW: 14.2 % (ref 11.5–15.5)
WBC: 10 10*3/uL (ref 4.0–10.5)
nRBC: 0 % (ref 0.0–0.2)

## 2021-05-16 LAB — IRON AND TIBC
Iron: 46 ug/dL (ref 28–170)
Saturation Ratios: 14 % (ref 10.4–31.8)
TIBC: 319 ug/dL (ref 250–450)
UIBC: 273 ug/dL

## 2021-05-16 LAB — FERRITIN: Ferritin: 168 ng/mL (ref 11–307)

## 2021-05-16 LAB — HEMOGLOBIN AND HEMATOCRIT, BLOOD
HCT: 31.4 % — ABNORMAL LOW (ref 36.0–46.0)
Hemoglobin: 10.1 g/dL — ABNORMAL LOW (ref 12.0–15.0)

## 2021-05-16 LAB — TYPE AND SCREEN
ABO/RH(D): A POS
Antibody Screen: NEGATIVE

## 2021-05-16 LAB — MAGNESIUM: Magnesium: 2.6 mg/dL — ABNORMAL HIGH (ref 1.7–2.4)

## 2021-05-16 MED ORDER — OXYCODONE HCL 5 MG PO TABS
10.0000 mg | ORAL_TABLET | ORAL | Status: DC | PRN
  Administered 2021-05-16 – 2021-05-22 (×20): 10 mg via ORAL
  Filled 2021-05-16 (×21): qty 2

## 2021-05-16 MED ORDER — GABAPENTIN 100 MG PO CAPS
100.0000 mg | ORAL_CAPSULE | Freq: Every day | ORAL | Status: DC
  Administered 2021-05-16 – 2021-05-20 (×5): 100 mg via ORAL
  Filled 2021-05-16 (×5): qty 1

## 2021-05-16 MED ORDER — FLEET ENEMA 7-19 GM/118ML RE ENEM
1.0000 | ENEMA | Freq: Every day | RECTAL | Status: DC | PRN
  Administered 2021-05-17: 1 via RECTAL

## 2021-05-16 MED ORDER — BISACODYL 10 MG RE SUPP
10.0000 mg | Freq: Every day | RECTAL | Status: DC | PRN
  Filled 2021-05-16: qty 1

## 2021-05-16 NOTE — Assessment & Plan Note (Addendum)
More likely anemia of chronic disease with ferritin above 100. ?

## 2021-05-16 NOTE — Evaluation (Signed)
Physical Therapy Evaluation ?Patient Details ?Name: Sherry [name] ?MRN: [id number] ?DOB: [birth year] ?Today's Date: 05/16/2021 ? ?History of Present Illness ? Patient is an 88 year old female who presented to [facility] ED due to epigastric area pain that radiated into her chest. Patient admitted for a perforated duodenal ulcer. PMH (+) for HTN. ?  ?Clinical Impression ? Physical Therapy Evaluation completed on this date. Patient was agreeable to treatment, however evaluation limited due to patient's pain (7-8/10 in abdomen and hip). PLOF and patient's mobility reviewed from patient's previous admission, and confirmed with patient. Patient demonstrated Mod I bed mobility, with increased effort and use of bed rails to pull up into sitting EOB, as well as into long sitting for pillow repositioning. X2 sit to stands from EOB were completed at supervision with RW. First attempt, patient was only able to stand for ~1 minute before reported increased pain, with max encouragement, patient agreed to ambulate to hospital doorway. Ambulating to/from doorway, patient required CGA for safety due to BLE weakness and pain. BLE strength at least 3/5. Patient would continue to benefit from skilled physical therapy in order to optimize patient's return to PLOF. Recommend HHPT upon discharge from acute hospitalization.  ?   ? ?Recommendations for follow up therapy are one component of a multi-disciplinary discharge planning process, led by the attending physician.  Recommendations may be updated based on patient status, additional functional criteria and insurance authorization. ? ?Follow Up Recommendations Home health PT ? ?  ?Assistance Recommended at Discharge PRN  ?Patient can return home with the following ? Assistance with cooking/housework;Assist for transportation;Help with stairs or ramp for entrance;A little help with walking and/or transfers;A little help with bathing/dressing/bathroom ? ?  ?Equipment Recommendations None  recommended by PT  ?Recommendations for Other Services ?    ?  ?Functional Status Assessment Patient has had a recent decline in their functional status and demonstrates the ability to make significant improvements in function in a reasonable and predictable amount of time.  ? ?  ?Precautions / Restrictions Precautions ?Precautions: Fall ?Restrictions ?Weight Bearing Restrictions: No  ? ?  ? ?Mobility ? Bed Mobility ?Overal bed mobility: Modified Independent ?Bed Mobility: Supine to Sit, Sit to Supine ?  ?  ?Supine to sit: Modified independent (Device/Increase time), HOB elevated ?Sit to supine: Modified independent (Device/Increase time), HOB elevated ?  ?General bed mobility comments: increased effort and use of bed rails to pull up into sitting, however no physical assistance required, patient able to pull up into long sitting x4 times for author to fix patient's pillow ?  ? ?Transfers ?Overall transfer level: Needs assistance ?Equipment used: Rolling walker (2 wheels) ?Transfers: Sit to/from Stand ?Sit to Stand: Supervision ?  ?  ?  ?  ?  ?General transfer comment: patient demonstrated good safe hand placement ?  ? ?Ambulation/Gait ?Ambulation/Gait assistance: Min guard ?Gait Distance (Feet): 35 Feet ?Assistive device: Rolling walker (2 wheels) ?Gait Pattern/deviations: Step-through pattern, Decreased step length - right, Decreased step length - left, Decreased stride length, Trunk flexed, Narrow base of support ?Gait velocity: decreased ?  ?  ?General Gait Details: increased pain with ambulation in hip, significant use of BUE on RW for support, mild unsteadiness, no LOB noted ? ?Stairs ?  ?  ?  ?  ?  ? ?Wheelchair Mobility ?  ? ?Modified Rankin (Stroke Patients Only) ?  ? ?  ? ?Balance Overall balance assessment: Needs assistance ?Sitting-balance support: No upper extremity supported, Feet supported ?Sitting balance-Leahy Scale: Good ?  Sitting balance - Comments: steady sitting reaching within BOS ?  ?Standing  balance support: Bilateral upper extremity supported, During functional activity, Reliant on assistive device for balance ?Standing balance-Leahy Scale: Fair ?Standing balance comment: steady ambulating with RW, heavy reliance on BUEs for support ?  ?  ?  ?  ?  ?  ?  ?  ?  ?  ?  ?   ? ? ? ?Pertinent Vitals/Pain Pain Assessment ?Pain Assessment: 0-10 ?Pain Score: 7  ?Pain Location: abdomen (7/10) L hip (8/10) ?Pain Descriptors / Indicators: Aching, Guarding, Grimacing ?Pain Intervention(s): Limited activity within patient's tolerance, Monitored during session, Repositioned  ? ? ?Home Living Family/patient expects to be discharged to:: Private residence ?Living Arrangements: Spouse/significant other (husband) ?Available Help at Discharge: Family;Available 24 hours/day ?Type of Home: House ?Home Access: Level entry ?  ?  ?  ?Home Layout: One level ?Home Equipment: Conservation officer, nature (2 wheels) ?   ?  ?Prior Function Prior Level of Function : Independent/Modified Independent ?  ?  ?  ?  ?  ?  ?Mobility Comments: Ind amb community distances without an AD ?ADLs Comments: Ind with ADLs ?  ? ? ?Hand Dominance  ? Dominant Hand: Right ? ?  ?Extremity/Trunk Assessment  ? Upper Extremity Assessment ?Upper Extremity Assessment: Generalized weakness ?  ? ?Lower Extremity Assessment ?Lower Extremity Assessment: Generalized weakness (At least 3/5 strength bilaterally) ?  ? ?   ?Communication  ? Communication: No difficulties  ?Cognition Arousal/Alertness: Awake/alert ?Behavior During Therapy: [facility] for tasks assessed/performed ?Overall Cognitive Status: Within Functional Limits for tasks assessed ?  ?  ?  ?  ?  ?  ?  ?  ?  ?  ?  ?  ?  ?  ?  ?  ?General Comments: A&Ox3- self, location, situation ?  ?  ? ?  ?General Comments General comments (skin integrity, edema, etc.): SpO2 98% on room air, HR 96bpm ? ?  ?Exercises Other Exercises ?Other Exercises: patient educated on role of PT in acute setting, fall risk  ? ?Assessment/Plan  ?  ?PT  Assessment Patient needs continued PT services  ?PT Problem List Decreased strength;Decreased activity tolerance;Decreased balance;Decreased mobility;Decreased knowledge of use of DME;Pain ? ?   ?  ?PT Treatment Interventions DME instruction;Gait training;Functional mobility training;Therapeutic activities;Therapeutic exercise;Patient/family education   ? ?PT Goals (Current goals can be found in the Care Plan section)  ?Acute Rehab PT Goals ?Patient Stated Goal: to not be in so much pain ?PT Goal Formulation: With patient ?Time For Goal Achievement: 05/30/21 ?Potential to Achieve Goals: Good ? ?  ?Frequency Min 2X/week ?  ? ? ?Co-evaluation   ?  ?  ?  ?  ? ? ?  ?AM-PAC PT "6 Clicks" Mobility  ?Outcome Measure Help needed turning from your back to your side while in a flat bed without using bedrails?: A Little ?Help needed moving from lying on your back to sitting on the side of a flat bed without using bedrails?: A Little ?Help needed moving to and from a bed to a chair (including a wheelchair)?: A Little ?Help needed standing up from a chair using your arms (e.g., wheelchair or bedside chair)?: A Little ?Help needed to walk in hospital room?: A Little ?Help needed climbing 3-5 steps with a railing? : A Lot ?6 Click Score: 17 ? ?  ?End of Session Equipment Utilized During Treatment: Gait belt ?Activity Tolerance: Patient tolerated treatment well ?Patient left: in bed;with call bell/phone within reach;with nursing/sitter  in room ?Nurse Communication: Mobility status ?PT Visit Diagnosis: Unsteadiness on feet (R26.81);Muscle weakness (generalized) (M62.81);Difficulty in walking, not elsewhere classified (R26.2);Pain ?  ? ?Time: 7129-2909 ?PT Time Calculation (min) (ACUTE ONLY): 18 min ? ? ?Charges:   PT Evaluation ?$PT Eval Low Complexity: 1 Low ?  ?  ?   ? ?Iva Boop, PT  ?05/16/21. 1:31 PM ? ? ?

## 2021-05-16 NOTE — Progress Notes (Signed)
?  Progress Note ? ? ?Patient: Sherry [name] ZOX:[id number] DOB: 03/25/1930 DOA: 05/14/2021     1 ?DOS: the patient was seen and examined on 05/16/2021 ?  ? ? ?Assessment and Plan: ?* Duodenal ulcer with perforation ([place]) ?Appreciate general surgery consultation.  Case discussed with Dr. Hampton Abbot general surgery today.  NPO.  IV fluid hydration.  IV Protonix twice daily.  Perforation seems to be contained and general surgery wants to watch things medically at this point.  Continue pain control with IV Dilaudid and oral oxycodone increased dose. ? ?Abdominal pain ?I increase the dose of oral oxycodone 10 mg every 4 as needed.  Continue as needed IV Dilaudid. ? ?Sepsis ([place]) ?Ruled out.  On empiric antibiotics secondary to duodenal ulcer perforation. ? ?Essential hypertension ?Restart Norvasc and hold on the blood pressure medications ? ?Spinal stenosis at L4-L5 level ?Pt advised to avoid all NSAIDS and take non nsaid based pain control with tylenol.  Currently on pain medications for duodenal perforation.  Add gabapentin back.  Physical therapy consultation. ? ? ?Thrombocytosis ?Likely acute phase reactant with perforated duodenal ulcer.  Platelet count trending better to 438. ? ?Anemia, unspecified ?We will add on iron studies.  Hemoglobin stable in the tens. ? ? ? ? ?  ? ?Subjective: Patient she states she cannot take the pain anymore.  In severe pain.  She wants people to listen to her.  She just wants to be comfortable.  Admitted with perforated duodenal ulcer. ? ?Physical Exam: ?Vitals:  ? 05/16/21 0525 05/16/21 0820 05/16/21 1221 05/16/21 1318  ?BP: (!) 165/52 (!) 188/72 (!) 164/59 (!) 172/59  ?Pulse: 86 (!) 106 91 96  ?Resp: '16 18 16   '$ ?Temp: 98.2 ?F (36.8 ?C) 98.5 ?F (36.9 ?C) 98.1 ?F (36.7 ?C)   ?TempSrc:  Oral    ?SpO2: 97% 97% 96% 98%  ?Weight:      ?Height:      ? ?Physical Exam ?HENT:  ?   Head: Normocephalic.  ?   Mouth/Throat:  ?   Pharynx: No oropharyngeal exudate.  ?Eyes:  ?   General: Lids are  normal.  ?   Conjunctiva/sclera: Conjunctivae normal.  ?Cardiovascular:  ?   Rate and Rhythm: Normal rate and regular rhythm.  ?   Heart sounds: Normal heart sounds, S1 normal and S2 normal.  ?Pulmonary:  ?   Breath sounds: Examination of the right-lower field reveals decreased breath sounds. Examination of the left-lower field reveals decreased breath sounds. Decreased breath sounds present. No wheezing, rhonchi or rales.  ?Abdominal:  ?   General: There is distension.  ?   Palpations: Abdomen is soft.  ?   Tenderness: There is abdominal tenderness.  ?Musculoskeletal:  ?   Right lower leg: No swelling.  ?   Left lower leg: No swelling.  ?Skin: ?   General: Skin is warm.  ?   Findings: No rash.  ?Neurological:  ?   Mental Status: She is alert and oriented to person, place, and time.  ?  ?Data Reviewed: ?Hemoglobin is 10.6, platelet count 438 ?Laboratory and radiological data reviewed today ? ?Family Communication: Spoke with husband at the bedside ? ?Disposition: ?Status is: Inpatient ?Remains inpatient appropriate because: Has perforated duodenal ulcer ?Planned Discharge Destination: Home ? ?Author: ?Loletha Grayer, MD ?05/16/2021 1:31 PM ? ?For on call review [email or web address].  ?

## 2021-05-16 NOTE — Progress Notes (Signed)
05/16/2021 ? ?Subjective: ?Patient today reports that she still having significant pain in the abdomen.  She also has a lot of pain in the right hip and reports that both are about the same.  However her lab work today shows that her white blood cell count is now down to normal at 10.0 with a stable hemoglobin of 10.6.  She has also not had any fevers and has normal vital signs.  Denies any nausea or vomiting. ? ?Vital signs: ?Temp:  [98.1 ?F (36.7 ?C)-98.5 ?F (36.9 ?C)] 98.5 ?F (36.9 ?C) (03/18 0820) ?Pulse Rate:  [86-106] 106 (03/18 0820) ?Resp:  [11-20] 18 (03/18 0820) ?BP: (149-188)/(46-72) 188/72 (03/18 0820) ?SpO2:  [97 %-99 %] 97 % (03/18 0820)  ? ?Intake/Output: ?03/17 0701 - 03/18 0700 ?In: 680.6 [I.V.:586.3; IV Piggyback:94.3] ?Out: -  ?Last BM Date : 05/15/21 ? ?Physical Exam: ?Constitutional: No acute distress, but appears a little bit anxious ?Abdomen: Soft, nondistended, with reported tenderness to palpation particular in the epigastric and right upper quadrant areas.  However the patient does not appear to be in to severe pain distress when pushing on her abdomen.  Definitely there is no peritonitis at this point. ? ?Labs:  ?Recent Labs  ?  05/15/21 ?0730 05/15/21 ?2356 05/16/21 ?0838  ?WBC 15.8*  --  10.0  ?HGB 10.5* 10.1* 10.6*  ?HCT 32.5* 31.4* 33.7*  ?PLT 501*  --  438*  ? ?Recent Labs  ?  05/15/21 ?0730 05/16/21 ?0838  ?NA 135 135  ?K 4.4 3.8  ?CL 103 103  ?CO2 22 22  ?GLUCOSE 105* 116*  ?BUN 34* 22  ?CREATININE 0.82 0.68  ?CALCIUM 8.9 8.8*  ? ?No results for input(s): LABPROT, INR in the last 72 hours. ? ?Imaging: ?No results found. ? ?Assessment/Plan: ?This is a 88 y.o. female with a contained perforated duodenal ulcer. ? ?- Discussed with patient that clinically on her laboratory work-up things do seem to be improving and clinically her vital signs have been better as well.  On exam, her abdomen does not appear to be significantly tender and is definitely soft on palpation without any  peritonitis at this point.  I do not think we need to rush or repeat any imaging at this point.  Discussed with her the reasoning behind wanting to wait longer instead of rushing to surgery due to the location of the perforation and the potential risk for injury to other very important structures in that area. ?- Discussed with patient that sometimes anxiety can contribute to pain buildup and the patient also has other chronic pain issues which could be contributing to this.  Dr. Leslye Peer has increased the frequency of her oxycodone and I think she can alternate between the oxycodone and Dilaudid IV to help with her pain control. ?- For now, we will continue her plan for repeat imaging likely on Monday to reassess the contained perforation. ? ? ?I spent 25 minutes dedicated to the care of this patient on the date of this encounter to include pre-visit review of records, face-to-face time with the patient discussing diagnosis and management, and any post-visit coordination of care. ? ?Melvyn Neth, MD ?[facility]  ?

## 2021-05-17 ENCOUNTER — Inpatient Hospital Stay: Payer: Medicare Other

## 2021-05-17 DIAGNOSIS — I10 Essential (primary) hypertension: Secondary | ICD-10-CM | POA: Diagnosis not present

## 2021-05-17 DIAGNOSIS — D75839 Thrombocytosis, unspecified: Secondary | ICD-10-CM | POA: Diagnosis not present

## 2021-05-17 DIAGNOSIS — K265 Chronic or unspecified duodenal ulcer with perforation: Secondary | ICD-10-CM | POA: Diagnosis not present

## 2021-05-17 DIAGNOSIS — R1013 Epigastric pain: Secondary | ICD-10-CM | POA: Diagnosis not present

## 2021-05-17 DIAGNOSIS — D638 Anemia in other chronic diseases classified elsewhere: Secondary | ICD-10-CM

## 2021-05-17 LAB — BASIC METABOLIC PANEL
Anion gap: 10 (ref 5–15)
BUN: 18 mg/dL (ref 8–23)
CO2: 19 mmol/L — ABNORMAL LOW (ref 22–32)
Calcium: 9.1 mg/dL (ref 8.9–10.3)
Chloride: 108 mmol/L (ref 98–111)
Creatinine, Ser: 0.51 mg/dL (ref 0.44–1.00)
GFR, Estimated: >60 mL/min (ref >60)
Glucose, Bld: 105 mg/dL — ABNORMAL HIGH (ref 70–99)
Potassium: 3.9 mmol/L (ref 3.5–5.1)
Sodium: 137 mmol/L (ref 135–145)

## 2021-05-17 LAB — CBC
HCT: 35 % — ABNORMAL LOW (ref 36.0–46.0)
Hemoglobin: 10.9 g/dL — ABNORMAL LOW (ref 12.0–15.0)
MCH: 28.9 pg (ref 26.0–34.0)
MCHC: 31.1 g/dL (ref 30.0–36.0)
MCV: 92.8 fL (ref 80.0–100.0)
Platelets: 465 10*3/uL — ABNORMAL HIGH (ref 150–400)
RBC: 3.77 MIL/uL — ABNORMAL LOW (ref 3.87–5.11)
RDW: 14.5 % (ref 11.5–15.5)
WBC: 9.9 10*3/uL (ref 4.0–10.5)
nRBC: 0 % (ref 0.0–0.2)

## 2021-05-17 LAB — MAGNESIUM: Magnesium: 2.4 mg/dL (ref 1.7–2.4)

## 2021-05-17 MED ORDER — IOHEXOL 300 MG/ML  SOLN
80.0000 mL | Freq: Once | INTRAMUSCULAR | Status: AC | PRN
  Administered 2021-05-17: 80 mL via INTRAVENOUS

## 2021-05-17 MED ORDER — HYDROMORPHONE HCL 1 MG/ML IJ SOLN
0.5000 mg | INTRAMUSCULAR | Status: DC | PRN
Start: 2021-05-17 — End: 2021-05-22
  Administered 2021-05-17 – 2021-05-20 (×11): 0.5 mg via INTRAVENOUS
  Filled 2021-05-17 (×11): qty 1

## 2021-05-17 MED ORDER — SUCRALFATE 1 GM/10ML PO SUSP
1.0000 g | Freq: Three times a day (TID) | ORAL | Status: DC
  Administered 2021-05-17 – 2021-05-26 (×35): 1 g via ORAL
  Filled 2021-05-17 (×35): qty 10

## 2021-05-17 MED ORDER — IOHEXOL 9 MG/ML PO SOLN
500.0000 mL | Freq: Once | ORAL | Status: AC
  Administered 2021-05-17: 500 mL via ORAL

## 2021-05-17 MED ORDER — IRBESARTAN 150 MG PO TABS
300.0000 mg | ORAL_TABLET | Freq: Every day | ORAL | Status: DC
Start: 2021-05-17 — End: 2021-05-26
  Administered 2021-05-17 – 2021-05-26 (×10): 300 mg via ORAL
  Filled 2021-05-17 (×10): qty 2

## 2021-05-17 NOTE — Progress Notes (Signed)
?Progress Note ? ? ?Patient: Sherry [name] JOA:[id number] DOB: 1930/11/30 DOA: 05/14/2021     2 ?DOS: the patient was seen and examined on 05/17/2021 ?  ? ? ?Assessment and Plan: ?* Duodenal ulcer with perforation ([place]) ?General surgery ordered a repeat CAT scan.  Case discussed with Dr. Hampton Abbot general surgery today.  NPO.  IV fluid hydration.  IV Protonix twice daily.  Patient complaining of a lot of pain and it takes time for the nursing staff to get in to treat her pain.  I talked her about potentially doing a PCA pump which she will think about.  Continue oral and IV pain medications.  General surgery changed the Dilaudid to every 2 hours as needed. ? ?Abdominal pain ?Continue oxycodone 10 mg every 4 hours as needed and Dilaudid changed to every 2 hours by general surgery. ? ?Sepsis ([place]) ?Ruled out.  On empiric antibiotics secondary to duodenal ulcer perforation. ? ?Essential hypertension ?Restart Norvasc and Benicar substitute (Avapro) ? ?Spinal stenosis at L4-L5 level ?Pt advised to avoid all NSAIDS and take non nsaid based pain control with tylenol.  Currently on pain medications for duodenal perforation.  Add gabapentin back.  Physical therapy consultation. ? ? ?Thrombocytosis ?Likely acute phase reactant with perforated duodenal ulcer.  Platelet count 465. ? ?Anemia of chronic disease ?More likely anemia of chronic disease with ferritin above 100. ? ? ? ? ?  ? ?Subjective: The patient had lots of complaints about pain control and taking a while to get her pain medications when she calls for them.  I explained to her that I do not give standing dose pain medications because the Dilaudid can stack up in the system and then sometimes people can become unresponsive and need to be intubated.  I did talk about a PCA pump where she can control the medication.  Patient having abdominal pain severe in nature. ? ?Physical Exam: ?Vitals:  ? 05/16/21 2331 05/17/21 0409 05/17/21 0801 05/17/21 1258  ?BP: (!) 188/55  (!) 187/69 (!) 179/63 (!) 188/64  ?Pulse: 96 89 97 86  ?Resp: 16  16   ?Temp: 98.1 ?F (36.7 ?C)  97.8 ?F (36.6 ?C) 97.9 ?F (36.6 ?C)  ?TempSrc:   Oral Oral  ?SpO2: 98% 98% 98% 100%  ?Weight:      ?Height:      ? ?Physical Exam ?HENT:  ?   Head: Normocephalic.  ?Eyes:  ?   General: Lids are normal.  ?   Conjunctiva/sclera: Conjunctivae normal.  ?Cardiovascular:  ?   Rate and Rhythm: Normal rate and regular rhythm.  ?   Heart sounds: Normal heart sounds, S1 normal and S2 normal.  ?Pulmonary:  ?   Breath sounds: Normal breath sounds. No decreased breath sounds, wheezing, rhonchi or rales.  ?Abdominal:  ?   Palpations: Abdomen is soft.  ?   Tenderness: There is abdominal tenderness in the epigastric area.  ?Musculoskeletal:  ?   Right lower leg: No swelling.  ?   Left lower leg: No swelling.  ?Skin: ?   General: Skin is warm.  ?   Findings: No rash.  ?Neurological:  ?   Mental Status: She is alert and oriented to person, place, and time.  ?  ?Data Reviewed: ?Hemoglobin 10.9, white blood count 9.9, platelet count 465, ferritin 168, creatinine 0.51 ? ?Family Communication: Spoke with son and husband at the bedside ? ?Disposition: ?Status is: Inpatient ?Remains inpatient appropriate because: Patient has a perforated duodenal ulcer.  Repeat CT scan  today. ? ?Planned Discharge Destination: Home ? ?Author: ?Loletha Grayer, MD ?05/17/2021 1:37 PM ? ?For on call review [email or web address].  ?

## 2021-05-17 NOTE — TOC Progression Note (Signed)
Transition of Care (TOC) - Progression Note  ? ? ?Patient Details  ?Name: Sherry [name] ?MRN: [id number] ?Date of Birth: 09-Nov-1930 ? ?Transition of Care (TOC) CM/SW Contact  ?[name], RN ?Phone Number: ?05/17/2021, 11:14 AM ? ?Clinical Narrative:   3/19: PT/OT set up via [facility] after speaking with son who wishes to be main contact to avoid confusing spouse. Discussed options and went with [facility]. [name] RN CM  ? ? ? ?Expected Discharge Plan:  (TBD) ?Barriers to Discharge: Barriers Resolved ? ?Expected Discharge Plan and Services ?Expected Discharge Plan:  (TBD) ?  ?Discharge Planning Services: CM Consult ?  ?Living arrangements for the past 2 months: [place] ?                ?  ?  ?  ?  ?  ?HH Arranged: PT, OT ?[place] Agency: [facility] ([facility]) (Per Son Gwinda [place].) ?Date HH Agency Contacted: 05/17/21 ?Time [place]: 6579 ?Representative spoke with at [place]: Floydene Flock ? ? ?Social Determinants of Health (SDOH) Interventions ?  ? ?Readmission Risk Interventions ?No flowsheet data found. ? ?

## 2021-05-17 NOTE — Progress Notes (Signed)
05/17/2021 ? ?Subjective: ?Patient this morning appears to be in more pain and very anxious.  Reports that last night she was having worse pain and she had to wait more than an hour and a half to get her pain medication after asking one of the nurses.  Today, the patient reports that she still in pain and feels very miserable and is wanting something to be done.  Her husband and her son are at bedside with her and they are also frustrated with some of the care and her pain issues. ? ?The patient's white blood cell count today is stable at 9.9, has not had any fevers, is not tachycardic, and her blood pressure stable and within normal.   ? ?Patient also reports that she has not had a bowel movement in many days and is also wondering about going home versus another hospital. ? ?Vital signs: ?Temp:  [97.8 ?F (36.6 ?C)-98.7 ?F (37.1 ?C)] 97.8 ?F (36.6 ?C) (03/19 0801) ?Pulse Rate:  [84-97] 97 (03/19 0801) ?Resp:  [16-18] 16 (03/19 0801) ?BP: (158-188)/(51-69) 179/63 (03/19 0801) ?SpO2:  [96 %-99 %] 98 % (03/19 0801)  ? ?Intake/Output: ?03/18 0701 - 03/19 0700 ?In: 100 [P.O.:100] ?Out: -  ?Last BM Date : 05/15/21 ? ?Physical Exam: ?Constitutional: No acute distress ?Abdomen: Soft, nondistended, with tenderness to palpation in the epigastric area.  Unable to assess well her pain as she is moving a lot and appears very emotional. ? ?Labs:  ?Recent Labs  ?  05/16/21 ?2229 05/17/21 ?0800  ?WBC 10.0 9.9  ?HGB 10.6* 10.9*  ?HCT 33.7* 35.0*  ?PLT 438* 465*  ? ?Recent Labs  ?  05/16/21 ?7989 05/17/21 ?0800  ?NA 135 137  ?K 3.8 3.9  ?CL 103 108  ?CO2 22 19*  ?GLUCOSE 116* 105*  ?BUN 22 18  ?CREATININE 0.68 0.51  ?CALCIUM 8.8* 9.1  ? ?No results for input(s): LABPROT, INR in the last 72 hours. ? ?Imaging: ?No results found. ? ?Assessment/Plan: ?This is a 88 y.o. female with contained duodenal perforated ulcer. ? ?- Discussed with the patient that I do not have much information about what happened last night and what the reason was  behind the delay in getting her pain medication.  However I do know that the nursing staff has a lot of patients and sometimes it takes a longer time to see everybody or go from one room to the next as other patients have other needs as well.  Her husband was asking about making her needs more of a priority and even inquired about hiring a separate nurse to be with her.  Discussed with him that unfortunately a hired nurse would not be able to give her any medications.  I also discussed that I do not think is a good idea to be discharged to home because of the contained perforation as we have not been able to reevaluate this and try to advance her diet.  I do not think a transfer to another hospital would be appropriate as well at this point as there is no specific surgical needs or medical needs to justify the transfer and hospitals are very busy with lack of beds. ?- Talked with them that at this moment I can change some of the frequency of the pain medications and we will change the IV Dilaudid to be available a little bit more frequently.  I cannot change the doses as the dose for the oxycodone is already at its maximum and the IV Dilaudid dose  is fairly strong. ?- Given the concerns and appearance of worsening pain even though her labs are stable and there are no clinical signs like fevers, tachycardia, or hypotension to support any worsening issues, I will still as a precaution order a repeat CT scan of the abdomen pelvis to reevaluate the area of contained perforation to make sure that that has not been worsening. ? ? ?I spent 35 minutes dedicated to the care of this patient on the date of this encounter to include pre-visit review of records, face-to-face time with the patient discussing diagnosis and management, and any post-visit coordination of care. ? ?Melvyn Neth, MD ?[facility]  ?

## 2021-05-17 NOTE — Progress Notes (Signed)
05/17/21 ? ?CT scan personally viewed and discussed with radiology.  Overall, the prior area of possible contained duodenal perforation is more likely to be a larger diverticulum.  However, though not mentioned in the original CT, there is an area of the distal stomach with a contained perforation/ulcer.  This area is same in size, but with improved inflammatory changes compared to original CT scan.  Discussed with patient and her family.  For now, no emergent/urgent surgery is needed.  However, would keep NPO, IV fluids, PPI BID.  Dr. Leslye Peer adding carafate as well.  Will discuss with Dr. Christian Mate.  May need PICC/TPN tomorrow. ? ?Sherry Ree, MD ? ?

## 2021-05-17 NOTE — Plan of Care (Signed)

## 2021-05-18 DIAGNOSIS — I10 Essential (primary) hypertension: Secondary | ICD-10-CM | POA: Diagnosis not present

## 2021-05-18 DIAGNOSIS — K5710 Diverticulosis of small intestine without perforation or abscess without bleeding: Secondary | ICD-10-CM | POA: Diagnosis not present

## 2021-05-18 DIAGNOSIS — K3189 Other diseases of stomach and duodenum: Secondary | ICD-10-CM | POA: Diagnosis not present

## 2021-05-18 DIAGNOSIS — R1013 Epigastric pain: Secondary | ICD-10-CM | POA: Diagnosis not present

## 2021-05-18 DIAGNOSIS — K2980 Duodenitis without bleeding: Secondary | ICD-10-CM | POA: Diagnosis present

## 2021-05-18 LAB — CBC
HCT: 30.8 % — ABNORMAL LOW (ref 36.0–46.0)
Hemoglobin: 9.9 g/dL — ABNORMAL LOW (ref 12.0–15.0)
MCH: 29.1 pg (ref 26.0–34.0)
MCHC: 32.1 g/dL (ref 30.0–36.0)
MCV: 90.6 fL (ref 80.0–100.0)
Platelets: 351 10*3/uL (ref 150–400)
RBC: 3.4 MIL/uL — ABNORMAL LOW (ref 3.87–5.11)
RDW: 14.6 % (ref 11.5–15.5)
WBC: 6.8 10*3/uL (ref 4.0–10.5)
nRBC: 0 % (ref 0.0–0.2)

## 2021-05-18 LAB — BASIC METABOLIC PANEL
Anion gap: 7 (ref 5–15)
BUN: 17 mg/dL (ref 8–23)
CO2: 22 mmol/L (ref 22–32)
Calcium: 8.6 mg/dL — ABNORMAL LOW (ref 8.9–10.3)
Chloride: 108 mmol/L (ref 98–111)
Creatinine, Ser: 0.58 mg/dL (ref 0.44–1.00)
GFR, Estimated: >60 mL/min (ref >60)
Glucose, Bld: 96 mg/dL (ref 70–99)
Potassium: 3.4 mmol/L — ABNORMAL LOW (ref 3.5–5.1)
Sodium: 137 mmol/L (ref 135–145)

## 2021-05-18 LAB — GLUCOSE, CAPILLARY: Glucose-Capillary: 96 mg/dL (ref 70–99)

## 2021-05-18 LAB — MAGNESIUM: Magnesium: 2.1 mg/dL (ref 1.7–2.4)

## 2021-05-18 MED ORDER — POTASSIUM CHLORIDE IN NACL 20-0.9 MEQ/L-% IV SOLN
INTRAVENOUS | Status: DC
  Filled 2021-05-18 (×5): qty 1000

## 2021-05-18 MED ORDER — HYDROCHLOROTHIAZIDE 12.5 MG PO TABS
12.5000 mg | ORAL_TABLET | Freq: Every day | ORAL | Status: DC
  Administered 2021-05-18 – 2021-05-25 (×8): 12.5 mg via ORAL
  Filled 2021-05-18 (×8): qty 1

## 2021-05-18 MED ORDER — BOOST / RESOURCE BREEZE PO LIQD CUSTOM
1.0000 | Freq: Three times a day (TID) | ORAL | Status: DC
Start: 2021-05-18 — End: 2021-05-26
  Administered 2021-05-18 – 2021-05-26 (×18): 1 via ORAL

## 2021-05-18 NOTE — Progress Notes (Signed)
05/18/2021 ? ?Subjective: ?Patient this afternoon is smiling for the first time I have seen.  She reports that she still is having pain but it is much better managed.   ?We reviewed the new findings on her CT scan, and the fact that she has had no extra vacillation of her oral contrast.  I believe she will be able to tolerate clear liquids, likely without exacerbating her pain or her ulcer. ? ?Vital signs: ?Temp:  [97.7 ?F (36.5 ?C)-98.4 ?F (36.9 ?C)] 97.8 ?F (36.6 ?C) (03/20 0700) ?Pulse Rate:  [84-96] 91 (03/20 0700) ?Resp:  [16-18] 16 (03/20 0700) ?BP: (166-188)/(53-70) 188/62 (03/20 0700) ?SpO2:  [98 %-100 %] 99 % (03/20 0700)  ? ?Intake/Output: ?No intake/output data recorded. ?Last BM Date : 05/17/21 ? ?Physical Exam: ?Constitutional: No acute distress ?Abdomen: Soft, nondistended, with less tenderness to palpation in the epigastric area.   ? ?Labs:  ?Recent Labs  ?  05/17/21 ?0800 05/18/21 ?0619  ?WBC 9.9 6.8  ?HGB 10.9* 9.9*  ?HCT 35.0* 30.8*  ?PLT 465* 351  ? ? ?Recent Labs  ?  05/17/21 ?0800 05/18/21 ?0619  ?NA 137 137  ?K 3.9 3.4*  ?CL 108 108  ?CO2 19* 22  ?GLUCOSE 105* 96  ?BUN 18 17  ?CREATININE 0.51 0.58  ?CALCIUM 9.1 8.6*  ? ? ? ?Imaging: ?CT ABDOMEN PELVIS W CONTRAST ? ?Result Date: 05/17/2021 ?CLINICAL DATA:  Worsening abdominal pain. Known contained duodenal perforation. EXAM: CT ABDOMEN AND PELVIS WITH CONTRAST TECHNIQUE: Multidetector CT imaging of the abdomen and pelvis was performed using the standard protocol following bolus administration of intravenous contrast. RADIATION DOSE REDUCTION: This exam was performed according to the departmental dose-optimization program which includes automated exposure control, adjustment of the mA and/or kV according to patient size and/or use of iterative reconstruction technique. CONTRAST:  12m OMNIPAQUE IOHEXOL 300 MG/ML  SOLN COMPARISON:  CT abdomen pelvis dated May 14, 2021. FINDINGS: Lower chest: No acute abnormality. Hepatobiliary: No focal liver  abnormality. Unchanged gallstones. No gallbladder wall thickening or biliary dilatation. Pancreas: Unchanged 7 mm hypodense lesion in the pancreatic head. No ductal dilatation or surrounding inflammatory change. Spleen: Normal in size without focal abnormality. Adrenals/Urinary Tract: Adrenal glands are unremarkable. Kidneys are normal, without renal calculi, focal lesion, or hydronephrosis. Bladder is unremarkable. Stomach/Bowel: Improved wall thickening of the gastric antrum and proximal duodenum. Similar 2.8 x 2.4 cm air and fluid structure superior to the duodenal bulb (series 2, image 15) without wall thickening or surrounding inflammatory change. However, there is a similar small 0.9 x 1.6 x 1.6 cm outpouching of air arising superiorly from the distal stomach just proximal to the pylorus with mild surrounding inflammatory change (series 2, image 20; series 5, image 25; series 6, image 53), suspicious for ulcer. No obstruction. Sigmoid colonic diverticulosis. Diminutive or absent appendix. Vascular/Lymphatic: Aortic atherosclerosis. No enlarged abdominal or pelvic lymph nodes. Reproductive: Status post hysterectomy. No adnexal masses. Other: No abdominal wall hernia or abnormality. No abdominopelvic ascites. No pneumoperitoneum. Musculoskeletal: No acute or significant osseous findings. IMPRESSION: 1. Improved wall thickening of the gastric antrum and proximal duodenum, suggestive of improving peptic ulcer disease. Similar 2.8 x 2.4 cm air and fluid containing structure superior to the duodenal bulb without wall thickening or surrounding inflammatory change, favored to represent a duodenal diverticulum rather than contained perforation. 2. However, there is a similar small 0.9 x 1.6 x 1.6 cm outpouching of air arising superiorly from the distal stomach just proximal to the pylorus, suspicious for pre-pyloric gastric  ulcer. Consider further evaluation with endoscopy. 3. Unchanged 7 mm hypodense lesion in the  pancreatic head. Given the patient's age, no further workup or follow-up is recommended. 4. Unchanged cholelithiasis. 5. Aortic Atherosclerosis (ICD10-I70.0). Electronically Signed   By: Titus Dubin M.D.   On: 05/17/2021 15:21   ? 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   ?I believe initiating clear liquid diet at this time will be our best move for enteric feeding, and pose little risk in terms of exacerbating her condition. ?White count is normal, and no evidence of sepsis. ? ?I spent 25 minutes dedicated to the care of this patient on the date of this encounter to include pre-visit review of records, face-to-face time with the patient discussing diagnosis and management, and any post-visit coordination of care. ?Ronny Bacon, M.D., FACS ?[facility] ? ?05/18/2021 ; 3:46 PM ? ?

## 2021-05-18 NOTE — Progress Notes (Signed)
?  Progress Note ? ? ?Patient: Sherry [name] [id number] DOB: Feb 01, 1931 DOA: 05/14/2021     3 ?DOS: the patient was seen and examined on 05/18/2021 ?  ? ? ?Assessment and Plan: ?* Gastric perforation, acute ?Repeat CAT scan more consistent with gastric perforation rather than duodenal perforation.  Again is contained.  Case discussed with Dr. Christian Mate general surgery today and okay to start clear liquid diet.  Continue IV fluids.  Continue IV Protonix twice daily.  Continue pain medication IV Dilaudid and oral oxycodone. ? ?Duodenal diverticulum ?Repeat CAT scan more consistent with duodenal diverticulum rather than perforated duodenal ulcer. ? ?Abdominal pain ?Continue oxycodone 10 mg every 4 hours as needed and as needed IV Dilaudid. ? ?Essential hypertension ?Continue Norvasc and Benicar substitute (Avapro).  Blood pressure likely elevated secondary to pain.  Add back hydrochlorothiazide. ? ?Spinal stenosis at L4-L5 level ?Pt advised to avoid all NSAIDS and take non nsaid based pain control with tylenol.  Currently on pain medications for duodenal perforation.  Added gabapentin back.  Physical therapy consultation. ? ? ?Thrombocytosis ?Likely acute phase reactant with perforated gastric ulcer.  Platelet count normalized today to 351. ? ?Sepsis ([place]) ?Ruled out.  On empiric antibiotics secondary to duodenal ulcer perforation. ? ?Anemia of chronic disease ?More likely anemia of chronic disease with ferritin above 100. ? ? ? ? ?  ? ?Subjective: Patient still complains of a lot of pain.  Patient again complains of pain and does not want to wait for a long time for pain medications.  She states that they have been doing a better job at getting her pain medications.  Patient admitted with severe pain and found to have a perforated gastric ulcer. ? ?Physical Exam: ?Vitals:  ? 05/18/21 0405 05/18/21 0636 05/18/21 0700 05/18/21 1124  ?BP: (!) 179/70 (!) 184/68 (!) 188/62 (!) 179/58  ?Pulse: 88  91 87  ?Resp: '16  16  20  '$ ?Temp: 97.7 ?F (36.5 ?C)  97.8 ?F (36.6 ?C) 98 ?F (36.7 ?C)  ?TempSrc: Oral   Oral  ?SpO2: 98%  99% 98%  ?Weight:      ?Height:      ? ?Physical Exam ?HENT:  ?   Head: Normocephalic.  ?Eyes:  ?   General: Lids are normal.  ?   Conjunctiva/sclera: Conjunctivae normal.  ?Cardiovascular:  ?   Rate and Rhythm: Normal rate and regular rhythm.  ?   Heart sounds: Normal heart sounds, S1 normal and S2 normal.  ?Pulmonary:  ?   Breath sounds: Normal breath sounds. No decreased breath sounds, wheezing, rhonchi or rales.  ?Abdominal:  ?   Palpations: Abdomen is soft.  ?   Tenderness: There is abdominal tenderness in the epigastric area.  ?Musculoskeletal:  ?   Right lower leg: No swelling.  ?   Left lower leg: No swelling.  ?Skin: ?   General: Skin is warm.  ?   Findings: No rash.  ?Neurological:  ?   Mental Status: She is alert and oriented to person, place, and time.  ?  ?Data Reviewed: ?Hemoglobin 9.9, potassium 3.4, creatinine 0.58. ? ?Family Communication: Spoke with the patient's husband on the phone ? ?Disposition: ?Status is: Inpatient ?Remains inpatient appropriate because: Receiving IV treatment for perforated gastric ulcer.  Starting clear liquid diet today.  Not ready for disposition.  Still requiring IV pain medications. ?  ?Planned Discharge Destination: Home with Home Health ? ?Author: ?Loletha Grayer, MD ?05/18/2021 1:31 PM ? ?For on call review [email or web address].  ?

## 2021-05-18 NOTE — Assessment & Plan Note (Signed)
Repeat CAT scan more consistent with duodenal diverticulum rather than perforated duodenal ulcer. ?

## 2021-05-18 NOTE — Progress Notes (Signed)
Physical Therapy Treatment ?Patient Details ?Name: Sherry [name] ?MRN: [id number] ?DOB: 1931-02-21 ?Today's Date: 05/18/2021 ? ? ?History of Present Illness Patient is an 88 year old female who presented to [facility] ED due to epigastric area pain that radiated into her chest. Patient admitted for a perforated duodenal ulcer. PMH (+) for HTN. ? ?  ?PT Comments  ? ? Pt awake and alert resting in bed w/ family at bedside. Pt currently in pain, but does not provide a quantitative value; continues to report "I hurt, I just hurt" when asked about working w/ PT today. Pt refuses mobility, but agrees to LE exercises in bed (see "General LE Exercise" section). Pt will benefit from continued skilled PT in order to increase LE strength, improve mobility, decrease c/o pain, and restore PLOF. Current discharge recommendation remains appropriate due to the level of assistance required by the patient to ensure safety and improve overall function. ?  ?Recommendations for follow up therapy are one component of a multi-disciplinary discharge planning process, led by the attending physician.  Recommendations may be updated based on patient status, additional functional criteria and insurance authorization. ? ?Follow Up Recommendations ? Home health PT ?  ?  ?Assistance Recommended at Discharge PRN  ?Patient can return home with the following Assistance with cooking/housework;Assist for transportation;Help with stairs or ramp for entrance;A little help with walking and/or transfers;A little help with bathing/dressing/bathroom ?  ?Equipment Recommendations ? None recommended by PT  ?  ?Recommendations for Other Services   ? ? ?  ?Precautions / Restrictions Precautions ?Precautions: Fall ?Restrictions ?Weight Bearing Restrictions: No  ?  ? ?Mobility ? Bed Mobility ?  ?  ?  ?  ?  ?  ?  ?  ?  ? ?Transfers ?  ?  ?  ?  ?  ?  ?  ?  ?  ?  ?  ? ?Ambulation/Gait ?  ?  ?  ?  ?  ?  ?  ?  ? ? ?Stairs ?  ?  ?  ?  ?  ? ? ?Wheelchair Mobility ?   ? ?Modified Rankin (Stroke Patients Only) ?  ? ? ?  ?Balance   ?  ?  ?  ?  ?  ?  ?  ?  ?  ?  ?  ?  ?  ?  ?  ?  ?  ?  ?  ? ?  ?Cognition Arousal/Alertness: Awake/alert ?Behavior During Therapy: [facility] for tasks assessed/performed ?Overall Cognitive Status: Within Functional Limits for tasks assessed ?  ?  ?  ?  ?  ?  ?  ?  ?  ?  ?  ?  ?  ?  ?  ?  ?  ?  ?  ? ?  ?Exercises General Exercises - Lower Extremity ?Ankle Circles/Pumps: AROM, Both, 10 reps ?Heel Slides: AROM, Both, 10 reps ?Hip ABduction/ADduction: AROM, Both, 10 reps ?Straight Leg Raises: AROM, Both, 10 reps ? ?  ?General Comments   ?  ?  ? ?Pertinent Vitals/Pain Pain Assessment ?Pain Assessment: 0-10 ?Pain Score: 5  ?Pain Location: abdomen (7/10) L hip (8/10) ?Pain Descriptors / Indicators: Aching, Guarding, Grimacing ?Pain Intervention(s): Limited activity within patient's tolerance, Monitored during session, Repositioned  ? ? ?Home Living   ?  ?  ?  ?  ?  ?  ?  ?  ?  ?   ?  ?Prior Function    ?  ?  ?   ? ?PT  Goals (current goals can now be found in the care plan section) Progress towards PT goals: Progressing toward goals ? ?  ?Frequency ? ? ? Min 2X/week ? ? ? ?  ?PT Plan Current plan remains appropriate  ? ? ?Co-evaluation   ?  ?  ?  ?  ? ?  ?AM-PAC PT "6 Clicks" Mobility   ?Outcome Measure ? Help needed turning from your back to your side while in a flat bed without using bedrails?: A Little ?Help needed moving from lying on your back to sitting on the side of a flat bed without using bedrails?: A Little ?Help needed moving to and from a bed to a chair (including a wheelchair)?: A Little ?Help needed standing up from a chair using your arms (e.g., wheelchair or bedside chair)?: A Little ?Help needed to walk in hospital room?: A Little ?Help needed climbing 3-5 steps with a railing? : A Lot ?6 Click Score: 17 ? ?  ?End of Session   ?Activity Tolerance: Patient tolerated treatment well ?Patient left: in bed;with call bell/phone within reach;with  family/visitor present;with bed alarm set ?Nurse Communication: Mobility status ?PT Visit Diagnosis: Unsteadiness on feet (R26.81);Muscle weakness (generalized) (M62.81);Difficulty in walking, not elsewhere classified (R26.2);Pain ?Pain - Right/Left: Left ?Pain - part of body: Leg;Ankle and joints of foot;Hip ?  ? ? ?Time: 9242-6834 ?PT Time Calculation (min) (ACUTE ONLY): 12 min ? ?Charges:             ?          ? ? ?Jonnie Kind, SPT ?05/18/2021, 3:40 PM ? ?

## 2021-05-18 NOTE — Assessment & Plan Note (Addendum)
Repeat CAT scan more and surgery evaluation point towards more of duodenitis than ulcer with perforation, managing conservatively.   Normal bowel movements.  Tolerating p.o. ?

## 2021-05-19 DIAGNOSIS — I10 Essential (primary) hypertension: Secondary | ICD-10-CM | POA: Diagnosis not present

## 2021-05-19 DIAGNOSIS — K5710 Diverticulosis of small intestine without perforation or abscess without bleeding: Secondary | ICD-10-CM | POA: Diagnosis not present

## 2021-05-19 DIAGNOSIS — K3189 Other diseases of stomach and duodenum: Secondary | ICD-10-CM | POA: Diagnosis not present

## 2021-05-19 DIAGNOSIS — R1013 Epigastric pain: Secondary | ICD-10-CM | POA: Diagnosis not present

## 2021-05-19 NOTE — Progress Notes (Signed)
Physical Therapy Treatment ?Patient Details ?Name: Sherry [name] ?MRN: [id number] ?DOB: 11-10-1930 ?Today's Date: 05/19/2021 ? ? ?History of Present Illness Patient is an 88 year old female who presented to [facility] ED due to epigastric area pain that radiated into her chest. Patient admitted for a perforated duodenal ulcer. PMH (+) for HTN. ? ?  ?PT Comments  ? ? Pt awake and alert resting in bed w/ husband at bedside upon PT entrance into room today. Pt is willing to work w/ PT this afternoon, but does report "I hurt" prior to any mobility/transfers. She does not provide a quantitative value for the level of pain that she is in. Pt is able to complete all bed mobility w/ SUPERVISION to sit EOB and can progress to standing w/ CGA using RW. Pt ambulated ~38f w/ CGA and RW; notable grimacing and moaning throughout due to pain, but was able to return to bed w/ no significant increase in pain level. Pt will benefit from continued skilled PT in order to increase LE strength, improve mobility/gait/endurance, and restore PLOF. Current discharge recommendation remains appropriate due to the level of assistance required by the patient to ensure safety and improve overall function. ?  ?Recommendations for follow up therapy are one component of a multi-disciplinary discharge planning process, led by the attending physician.  Recommendations may be updated based on patient status, additional functional criteria and insurance authorization. ? ?Follow Up Recommendations ? Home health PT ?  ?  ?Assistance Recommended at Discharge PRN  ?Patient can return home with the following Assistance with cooking/housework;Assist for transportation;Help with stairs or ramp for entrance;A little help with walking and/or transfers;A little help with bathing/dressing/bathroom ?  ?Equipment Recommendations ? None recommended by PT  ?  ?Recommendations for Other Services   ? ? ?  ?Precautions / Restrictions Precautions ?Precautions:  Fall ?Restrictions ?Weight Bearing Restrictions: No  ?  ? ?Mobility ? Bed Mobility ?Overal bed mobility: Needs Assistance ?Bed Mobility: Supine to Sit, Sit to Supine ?  ?  ?Supine to sit: Supervision ?Sit to supine: Supervision ?  ?  ?  ? ?Transfers ?Overall transfer level: Needs assistance ?Equipment used: Rolling walker (2 wheels) ?Transfers: Sit to/from Stand ?Sit to Stand: Min guard ?  ?  ?  ?  ?  ?  ?  ? ?Ambulation/Gait ?Ambulation/Gait assistance: Min guard ?Gait Distance (Feet): 35 Feet ?Assistive device: Rolling walker (2 wheels) ?Gait Pattern/deviations: Step-through pattern, Decreased step length - right, Decreased step length - left, Decreased stride length, Trunk flexed, Narrow base of support ?Gait velocity: decreased ?  ?  ?  ? ? ?Stairs ?  ?  ?  ?  ?  ? ? ?Wheelchair Mobility ?  ? ?Modified Rankin (Stroke Patients Only) ?  ? ? ?  ?Balance Overall balance assessment: Needs assistance ?Sitting-balance support: No upper extremity supported, Feet supported ?Sitting balance-Leahy Scale: Good ?  ?  ?Standing balance support: Bilateral upper extremity supported, During functional activity, Reliant on assistive device for balance ?Standing balance-Leahy Scale: Fair ?  ?  ?  ?  ?  ?  ?  ?  ?  ?  ?  ?  ?  ? ?  ?Cognition Arousal/Alertness: Awake/alert ?Behavior [facility]: [facility] assessed/performed ?Overall Cognitive Status: Within Functional Limits for tasks assessed ?  ?  ?  ?  ?  ?  ?  ?  ?  ?  ?  ?  ?  ?  ?  ?  ?  ?  ?  ? ?  ?  Exercises   ? ?  ?General Comments   ?  ?  ? ?Pertinent Vitals/Pain Pain Assessment ?Pain Assessment: Faces ?Faces Pain Scale: Hurts even more ?Pain Descriptors / Indicators: Aching, Guarding, Grimacing, Moaning ?Pain Intervention(s): Limited activity within patient's tolerance, Monitored during session  ? ? ?Home Living   ?  ?  ?  ?  ?  ?  ?  ?  ?  ?   ?  ?Prior Function    ?  ?  ?   ? ?PT Goals (current goals can now be found in the care plan section) Progress towards PT  goals: Progressing toward goals ? ?  ?Frequency ? ? ? Min 2X/week ? ? ? ?  ?PT Plan Current plan remains appropriate  ? ? ?Co-evaluation   ?  ?  ?  ?  ? ?  ?AM-PAC PT "6 Clicks" Mobility   ?Outcome Measure ? Help needed turning from your back to your side while in a flat bed without using bedrails?: A Little ?Help needed moving from lying on your back to sitting on the side of a flat bed without using bedrails?: A Little ?Help needed moving to and from a bed to a chair (including a wheelchair)?: A Little ?Help needed standing up from a chair using your arms (e.g., wheelchair or bedside chair)?: A Little ?Help needed to walk in hospital room?: A Little ?Help needed climbing 3-5 steps with a railing? : A Lot ?6 Click Score: 17 ? ?  ?End of Session Equipment Utilized During Treatment: Gait belt ?Activity Tolerance: Patient tolerated treatment well ?Patient left: in bed;with call bell/phone within reach;with family/visitor present;with bed alarm set ?Nurse Communication: Mobility status ?PT Visit Diagnosis: Unsteadiness on feet (R26.81);Muscle weakness (generalized) (M62.81);Difficulty in walking, not elsewhere classified (R26.2);Pain ?Pain - Right/Left: Left ?Pain - part of body: Leg;Ankle and joints of foot;Hip ?  ? ? ?Time: 1451-1501 ?PT Time Calculation (min) (ACUTE ONLY): 10 min ? ?Charges:             ?          ? ? ?Jonnie Kind, SPT ?05/19/2021, 3:19 PM ? ?

## 2021-05-19 NOTE — Progress Notes (Signed)
?Progress Note ? ? ?Patient: Sherry [name] [id number] DOB: 20-Jan-1931 DOA: 05/14/2021     4 ?DOS: the patient was seen and examined on 05/19/2021 ?  ?Brief hospital course: ?88 year old female with history of hypertension and recent hospitalization for back pain and spinal stenosis.  He was admitted to the hospital on 05/14/2021 with severe abdominal pain and found to have a gastric perforation.  General surgery following the patient has been requiring IV and oral pain medications.  Patient has been on IV Protonix.  The patient was n.p.o. for a few days.  Started on clear liquid diet on 05/18/2021.  Will advance to full liquid diet on 05/19/2021.  The patient finally starting to feel little bit better with regards to her pain on 05/20/2018. ? ?Assessment and Plan: ?* Gastric perforation, acute ?Repeat CAT scan more consistent with gastric perforation rather than duodenal perforation.  Again is contained.  Case discussed with Dr. Christian Mate general surgery today and okay to change to full liquid diet.  Continue IV fluids.  Continue IV Protonix twice daily.  Continue pain medication IV Dilaudid and oral oxycodone. ? ?Duodenal diverticulum ?Repeat CAT scan more consistent with duodenal diverticulum rather than perforated duodenal ulcer. ? ?Abdominal pain ?Continue oxycodone 10 mg every 4 hours as needed and as needed IV Dilaudid. ? ?Essential hypertension ?Continue Norvasc and Benicar substitute (Avapro) and hydrochlorothiazide ? ?Spinal stenosis at L4-L5 level ?Pt advised to avoid all NSAIDS and take non nsaid based pain control with tylenol.  Currently on pain medications for duodenal perforation.  Added gabapentin back.  Physical therapy consultation. ? ? ?Thrombocytosis ?Likely acute phase reactant with perforated gastric ulcer.  Platelet count normalized today to 351. ? ?Sepsis ([place]) ?Ruled out.  On empiric antibiotics secondary to gastric ulcer perforation. ? ?Anemia of chronic disease ?More likely anemia of  chronic disease with ferritin above 100. ? ? ? ? ?  ? ?Subjective: Patient still requiring IV and oral pain medications for gastric perforation.  Feels a little bit better today and more comfortable with regards to her pain.  Tolerated liquid diet yesterday. ? ?Physical Exam: ?Vitals:  ? 05/18/21 2024 05/19/21 0006 05/19/21 4818 05/19/21 5631  ?BP: (!) 126/48 (!) 150/50 (!) 169/58 (!) 161/60  ?Pulse: 82 75 73 81  ?Resp: '18 18 18 18  '$ ?Temp: 98.8 ?F (37.1 ?C) 97.8 ?F (36.6 ?C) 98.1 ?F (36.7 ?C) 98.1 ?F (36.7 ?C)  ?TempSrc:  Oral Oral   ?SpO2: 97% 99% 99% 98%  ?Weight:      ?Height:      ? ?Physical Exam ?HENT:  ?   Head: Normocephalic.  ?Eyes:  ?   General: Lids are normal.  ?   Conjunctiva/sclera: Conjunctivae normal.  ?Cardiovascular:  ?   Rate and Rhythm: Normal rate and regular rhythm.  ?   Heart sounds: Normal heart sounds, S1 normal and S2 normal.  ?Pulmonary:  ?   Breath sounds: Normal breath sounds. No decreased breath sounds, wheezing, rhonchi or rales.  ?Abdominal:  ?   Palpations: Abdomen is soft.  ?   Tenderness: There is abdominal tenderness in the epigastric area.  ?Musculoskeletal:  ?   Right lower leg: No swelling.  ?   Left lower leg: No swelling.  ?Skin: ?   General: Skin is warm.  ?   Findings: No rash.  ?Neurological:  ?   Mental Status: She is alert and oriented to person, place, and time.  ?  ?Data Reviewed: ?Labs hemoglobin 9.9, last potassium 3.4 ? ?  Family Communication: Spoke with husband at bedside ? ?Disposition: ?Status is: Inpatient ?Remains inpatient appropriate because: We are treating for perforated gastric ulcer.  Slowly advance her diet. ? ?Planned Discharge Destination: Home with home health ? ? ?Author: ?Loletha Grayer, MD ?05/19/2021 10:57 AM ? ?For on call review [email or web address].  ?

## 2021-05-19 NOTE — Progress Notes (Signed)
PT Cancellation Note ? ?Patient Details ?Name: Sherry [name] ?MRN: [id number] ?DOB: 1930/09/05 ? ? ?Cancelled Treatment:    Reason Eval/Treat Not Completed: Patient declined; stated she wanted to finish her lunch because the doctor told her she needed to eat and for PT to come back later. Will attempt at later date/time. ? ? ?Jonnie Kind, SPT ?05/19/2021, 1:34 PM ?

## 2021-05-19 NOTE — Hospital Course (Addendum)
88 year old female with history of hypertension and recent hospitalization for back pain and spinal stenosis who presented to the emergency room on 05/14/2021 with severe abdominal pain and found to have a gastric perforation.  Admitted to the hospitalist service with general surgery following.  Being treated conservatively with IV PPI as well as pain medication and initially n.p.o., but then started on clear liquids 3/20 and diet slowly advanced.   ? ?By 3/27, patient tolerating p.o.  Able to ambulate some.  By time of discharge, patient felt lightheaded.  Orthostatics checked and patient found to have some mild orthostasis.  Medications adjusted.  Patient given IV fluids and by 3/28, able to be discharged. ?

## 2021-05-19 NOTE — Progress Notes (Signed)
05/19/2021 ? ?Subjective: ?Patient this afternoon reports that she still is better.   ?She appears to be tolerating full liquids currently without exacerbating her pain.  ? ?Vital signs: ?Temp:  [97.8 ?F (36.6 ?C)-98.6 ?F (37 ?C)] 98.6 ?F (37 ?C) (03/21 2017) ?Pulse Rate:  [73-83] 80 (03/21 2017) ?Resp:  [16-18] 18 (03/21 2017) ?BP: (126-169)/(44-64) 157/64 (03/21 2017) ?SpO2:  [96 %-99 %] 98 % (03/21 2017)  ? ?Intake/Output: ?03/20 0701 - 03/21 0700 ?In: 4982.4 [P.O.:180; I.V.:4205.6; IV Piggyback:596.8] ?Out: -  ?Last BM Date : 05/18/21 ? ?Physical Exam: ?Constitutional: No acute distress ?Abdomen: Soft, nondistended, with less tenderness to palpation in the epigastric area.   ? ?Labs:  ?Recent Labs  ?  05/17/21 ?0800 05/18/21 ?0619  ?WBC 9.9 6.8  ?HGB 10.9* 9.9*  ?HCT 35.0* 30.8*  ?PLT 465* 351  ? ?Recent Labs  ?  05/17/21 ?0800 05/18/21 ?0619  ?NA 137 137  ?K 3.9 3.4*  ?CL 108 108  ?CO2 19* 22  ?GLUCOSE 105* 96  ?BUN 18 17  ?CREATININE 0.51 0.58  ?CALCIUM 9.1 8.6*  ? ? ?Imaging: ?No results found. ? 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   ?Tolerating full liquids.  ?White count is normal, and no evidence of sepsis. ? ?I spent 10 minutes dedicated to the care of this patient on the date of this encounter.  ?Ronny Bacon, M.D., FACS ?[facility] ? ?05/19/2021 ; 9:45 PM ? ?

## 2021-05-20 DIAGNOSIS — D638 Anemia in other chronic diseases classified elsewhere: Secondary | ICD-10-CM | POA: Diagnosis not present

## 2021-05-20 DIAGNOSIS — K3189 Other diseases of stomach and duodenum: Secondary | ICD-10-CM | POA: Diagnosis not present

## 2021-05-20 DIAGNOSIS — K2980 Duodenitis without bleeding: Principal | ICD-10-CM

## 2021-05-20 DIAGNOSIS — K265 Chronic or unspecified duodenal ulcer with perforation: Secondary | ICD-10-CM | POA: Diagnosis not present

## 2021-05-20 DIAGNOSIS — D75839 Thrombocytosis, unspecified: Secondary | ICD-10-CM | POA: Diagnosis not present

## 2021-05-20 DIAGNOSIS — I10 Essential (primary) hypertension: Secondary | ICD-10-CM | POA: Diagnosis not present

## 2021-05-20 LAB — CULTURE, BLOOD (ROUTINE X 2)
Culture: NO GROWTH
Culture: NO GROWTH

## 2021-05-20 LAB — BASIC METABOLIC PANEL
Anion gap: 6 (ref 5–15)
BUN: 9 mg/dL (ref 8–23)
CO2: 23 mmol/L (ref 22–32)
Calcium: 9.1 mg/dL (ref 8.9–10.3)
Chloride: 109 mmol/L (ref 98–111)
Creatinine, Ser: 0.49 mg/dL (ref 0.44–1.00)
GFR, Estimated: >60 mL/min (ref >60)
Glucose, Bld: 109 mg/dL — ABNORMAL HIGH (ref 70–99)
Potassium: 4.1 mmol/L (ref 3.5–5.1)
Sodium: 138 mmol/L (ref 135–145)

## 2021-05-20 LAB — BRAIN NATRIURETIC PEPTIDE: B Natriuretic Peptide: 24.7 pg/mL (ref 0.0–100.0)

## 2021-05-20 LAB — CBC
HCT: 31.2 % — ABNORMAL LOW (ref 36.0–46.0)
Hemoglobin: 10.2 g/dL — ABNORMAL LOW (ref 12.0–15.0)
MCH: 29.1 pg (ref 26.0–34.0)
MCHC: 32.7 g/dL (ref 30.0–36.0)
MCV: 88.9 fL (ref 80.0–100.0)
Platelets: 350 10*3/uL (ref 150–400)
RBC: 3.51 MIL/uL — ABNORMAL LOW (ref 3.87–5.11)
RDW: 14.6 % (ref 11.5–15.5)
WBC: 7.7 10*3/uL (ref 4.0–10.5)
nRBC: 0 % (ref 0.0–0.2)

## 2021-05-20 LAB — MAGNESIUM: Magnesium: 1.8 mg/dL (ref 1.7–2.4)

## 2021-05-20 MED ORDER — ACETAMINOPHEN 325 MG PO TABS
650.0000 mg | ORAL_TABLET | Freq: Four times a day (QID) | ORAL | Status: DC
  Administered 2021-05-20 – 2021-05-22 (×10): 650 mg via ORAL
  Filled 2021-05-20 (×10): qty 2

## 2021-05-20 MED ORDER — HYDRALAZINE HCL 50 MG PO TABS
100.0000 mg | ORAL_TABLET | Freq: Three times a day (TID) | ORAL | Status: DC
  Administered 2021-05-20 – 2021-05-25 (×16): 100 mg via ORAL
  Filled 2021-05-20 (×16): qty 2

## 2021-05-20 MED ORDER — SODIUM CHLORIDE 0.9 % IV SOLN
3.0000 g | Freq: Four times a day (QID) | INTRAVENOUS | Status: DC
  Administered 2021-05-20 – 2021-05-25 (×19): 3 g via INTRAVENOUS
  Filled 2021-05-20 (×3): qty 8
  Filled 2021-05-20 (×5): qty 3
  Filled 2021-05-20 (×3): qty 8
  Filled 2021-05-20: qty 3
  Filled 2021-05-20: qty 8
  Filled 2021-05-20: qty 3
  Filled 2021-05-20 (×7): qty 8

## 2021-05-20 NOTE — Progress Notes (Signed)
[facility] Progress Note ? ?Patient: Sherry [name]    JAS:[id number]  DOA: 05/14/2021    ?Date of Service: the patient was seen and examined on 05/20/2021 ? ?Brief hospital course: ?88 year old female with history of hypertension and recent hospitalization for back pain and spinal stenosis who presented to the emergency room on 05/14/2021 with severe abdominal pain and found to have a gastric perforation.  Admitted to the hospitalist service with general surgery following.  Being treated conservatively with IV PPI as well as pain medication and initially n.p.o., but then started on clear liquids 3/20 and diet slowly advanced.   ? ?Assessment and Plan: ?Assessment and Plan: ?* Duodenitis ?Repeat CAT scan more and surgery evaluation point towards more of duodenitis than ulcer with perforation, managing conservatively.  On IV PPI and pain medication with diet slowly being advanced.  Currently on full liquids which for now we will continue. ? ?Abdominal pain ?Continue oxycodone 10 mg every 4 hours as needed and as needed IV Dilaudid. ? ?Essential hypertension ?Continue Norvasc and Benicar substitute (Avapro) and hydrochlorothiazide.  Blood pressures have been trending upward.  BNP within normal limits.  Likely in part due to pain.   We will add hydralazine as needed ? ?Spinal stenosis at L4-L5 level ?Pt advised to avoid all NSAIDS and take non nsaid based pain control with tylenol.  Currently on pain medications for duodenal perforation.  Added gabapentin back.  Physical therapy consultation. ? ? ?Thrombocytosis ?Likely acute phase reactant with perforated gastric ulcer.  Platelet count normalized today to 351. ? ?Anemia of chronic disease ?More likely anemia of chronic disease with ferritin above 100. ? ? ? ? ? ? ?Body mass index is 23.03 kg/m?.  ?  ?   ? ?Consultants: ?General surgery ? ?Procedures: ?None ? ?Antimicrobials: ?IV cefepime 3/17 - 3/22 ?IV Unasyn 3/22-present ? ?Code Status: Full  code ? ? ?Subjective: Patient doing okay, still some abdominal pain although tolerating p.o. ? ?Objective: ?Mildly elevated blood pressures ?Vitals:  ? 05/20/21 0734 05/20/21 1247  ?BP: (!) 167/55 (!) 139/47  ?Pulse: 93 86  ?Resp: 15 18  ?Temp: 98 ?F (36.7 ?C)   ?SpO2: 98% 97%  ? ? ?Intake/Output Summary (Last 24 hours) at 05/20/2021 1447 ?Last data filed at 05/19/2021 1930 ?Gross per 24 hour  ?Intake 401.16 ml  ?Output 1 ml  ?Net 400.16 ml  ? ?Filed Weights  ? 05/14/21 1947  ?Weight: 59 kg  ? ?Body mass index is 23.03 kg/m?. ? ?Exam: ? ?General: Alert and oriented x2, no acute distress ?HEENT: Normocephalic, atraumatic, mucous membranes slightly dry ?Cardiovascular: Regular rate and rhythm, S1-S2 ?Respiratory: Clear to auscultation bilaterally ?Abdomen: Soft, minimally distended, minimal tenderness, hypoactive bowel sounds ?Musculoskeletal: No clubbing or cyanosis or edema ?Skin: No skin breaks, tears or lesions ?Psychiatry: Appropriate, no evidence of psychoses ?Neurology: No focal deficits ? ?Data Reviewed: ?Normal BNP of 24 ? ?Disposition:  ?Status is: Inpatient ?Remains inpatient appropriate because: Further clearance by surgery ?  ? ?Anticipated discharge date: 3/25 ? ?Remaining issues to be resolved so that patient can be discharged: Advancement of diet and decreasing pain medications ? ? ?Family Communication: Husband at the bedside ?DVT Prophylaxis: ?SCDs Start: 05/15/21 0041 ? ? ? ?Author: ?Annita Brod ,MD ?05/20/2021 2:47 PM ? ?To reach On-call, see care teams to locate the attending and reach out via [email or web address]. ?Between 7PM-7AM, please contact night-coverage ?If you still have difficulty reaching the attending provider, please page the [facility] (Director on Call) for [facility]  on amion for assistance. ? ?

## 2021-05-20 NOTE — Progress Notes (Signed)
[facility] ?SURGICAL PROGRESS NOTE (cpt [phone]) ? ?Hospital Day(s): 5.  ? ?Interval History: Patient seen and examined, no acute events or new complaints overnight. Patient is hyper-focused on her MSK pain, particularly her left hip. She asks many times why I can not give her any medications. She reports fairly vague abdominal discomfort and just says "it hurts all over." No appetite. No emesis.. She continues to remain without leukocytosis with WBC 7.7K. Hgb stable at 10.2. Renal function remains normal; sCr - 0.49; UO - unmeasured x4. No electrolyte derangements this morning. She continues on Cefepime. She is on full liquids and tolerating well.  ? ?Review of Systems:  ?Unable to preform reliably as she is a poor historian  ? ?Vital signs in last 24 hours: [min-max] current  ?Temp:  [98 ?F (36.7 ?C)-98.6 ?F (37 ?C)] 98.2 ?F (36.8 ?C) (03/22 0554) ?Pulse Rate:  [78-86] 78 (03/22 0554) ?Resp:  [16-18] 16 (03/22 0554) ?BP: (126-179)/(44-64) 175/64 (03/22 0554) ?SpO2:  [95 %-98 %] 95 % (03/22 0554)     Height: '5\' 3"'$  (160 cm) Weight: 59 kg BMI (Calculated): 23.03  ? ?Intake/Output last 2 shifts:  ?03/21 0701 - 03/22 0700 ?In: 1522.2 [P.O.:360; I.V.:936.9; IV Piggyback:225.3] ?Out: 1 [Urine:1]  ? ?Physical Exam:  ?Constitutional: alert, cooperative and no distress  ?HENT: normocephalic without obvious abnormality  ?Eyes: PERRL, EOM's grossly intact and symmetric  ?Respiratory: breathing non-labored at rest  ?Cardiovascular: regular rate and sinus rhythm  ?Gastrointestinal: Abdomen is soft, she does not appear tender on examination, no evidence of distension. She is certainly without evidence of peritonitis  ? ? ?Labs:  ?CBC Latest Ref Rng & Units 05/20/2021 05/18/2021 05/17/2021  ?WBC 4.0 - 10.5 K/uL 7.7 6.8 9.9  ?Hemoglobin 12.0 - 15.0 g/dL 10.2(L) 9.9(L) 10.9(L)  ?Hematocrit 36.0 - 46.0 % 31.2(L) 30.8(L) 35.0(L)  ?Platelets 150 - 400 K/uL 350 351 465(H)  ? ?CMP Latest Ref Rng & Units 05/20/2021 05/18/2021  05/17/2021  ?Glucose 70 - 99 mg/dL 109(H) 96 105(H)  ?BUN 8 - 23 mg/dL '9 17 18  '$ ?Creatinine 0.44 - 1.00 mg/dL 0.49 0.58 0.51  ?Sodium 135 - 145 mmol/L 138 137 137  ?Potassium 3.5 - 5.1 mmol/L 4.1 3.4(L) 3.9  ?Chloride 98 - 111 mmol/L 109 108 108  ?CO2 22 - 32 mmol/L 23 22 19(L)  ?Calcium 8.9 - 10.3 mg/dL 9.1 8.6(L) 9.1  ?Total Protein 6.5 - 8.1 g/dL - - -  ?Total Bilirubin 0.3 - 1.2 mg/dL - - -  ?Alkaline Phos 38 - 126 U/L - - -  ?AST 15 - 41 U/L - - -  ?ALT 0 - 44 U/L - - -  ? ? ? ?Imaging studies: No new pertinent imaging studies ? ? ?Assessment/Plan: (ICD-10's: K26.5) ?88 y.o. female doing well clinically admitted with duodenal diverticulum, with likely enteritis/duodenitis and prepyloric ulcer without evidence of noncontained perforation.   ? ? - I think it is reasonable to continue full liquids for now + nutritional supplementation ? - Continue IV PPI; 40 mg BID for home (may consider empiric treatment for H pylori at home) ? - Continue IV Abx for now (Cefepime) ?- Monitor abdominal examination   ? - Pain control prn; seems to be biggest barrier right now ? - Further management per primary service; we will follow  ? ?All of the above findings and recommendations were discussed with the patient, and the medical team, and all of patient's questions were answered to her expressed satisfaction. ? ?-- ?Edison Simon,  PA-C ?[facility] ?05/20/2021, 7:17 AM ?[phone] ?M-F: 7am - 4pm ? ?

## 2021-05-21 DIAGNOSIS — D75839 Thrombocytosis, unspecified: Secondary | ICD-10-CM | POA: Diagnosis not present

## 2021-05-21 DIAGNOSIS — K3189 Other diseases of stomach and duodenum: Secondary | ICD-10-CM | POA: Diagnosis not present

## 2021-05-21 DIAGNOSIS — K2980 Duodenitis without bleeding: Secondary | ICD-10-CM | POA: Diagnosis not present

## 2021-05-21 DIAGNOSIS — I10 Essential (primary) hypertension: Secondary | ICD-10-CM | POA: Diagnosis not present

## 2021-05-21 DIAGNOSIS — K265 Chronic or unspecified duodenal ulcer with perforation: Secondary | ICD-10-CM | POA: Diagnosis not present

## 2021-05-21 LAB — LIPASE, BLOOD: Lipase: 58 U/L — ABNORMAL HIGH (ref 11–51)

## 2021-05-21 MED ORDER — LIDOCAINE 5 % EX PTCH
1.0000 | MEDICATED_PATCH | CUTANEOUS | Status: DC
  Filled 2021-05-21: qty 1

## 2021-05-21 MED ORDER — GABAPENTIN 100 MG PO CAPS
100.0000 mg | ORAL_CAPSULE | Freq: Three times a day (TID) | ORAL | Status: DC
Start: 2021-05-21 — End: 2021-05-25
  Administered 2021-05-21 – 2021-05-25 (×12): 100 mg via ORAL
  Filled 2021-05-21 (×12): qty 1

## 2021-05-21 NOTE — Progress Notes (Signed)
PT Cancellation Note ? ?Patient Details ?Name: Sherry [name] ?MRN: [id number] ?DOB: 1930/06/24 ? ? ?Cancelled Treatment:    Reason Eval/Treat Not Completed: Other (comment).  Pt resting in bed upon PT arrival; pt declining PT d/t having too much pain (although pt did not state level of pain via 1-10/10 pain scale).  Pt agreeable to therapy after next pain pill.  Nurse notified of above.  Will re-attempt PT session later today. ? ?Leitha Bleak, PT ?05/21/21, 11:53 AM ? ?

## 2021-05-21 NOTE — Progress Notes (Signed)
[facility] ?SURGICAL PROGRESS NOTE (cpt [phone]) ? ?Hospital Day(s): 6.  ? ?Interval History: Patient seen and examined, no acute events or new complaints overnight. Patient still significantly focused on her MSK pains and states "I just hurt." She is  able to illicit more localized epigastric abdominal discomfort this morning. She remains without much of an appetite. No fever, chills, nausea, emesis. No new labs this morning. She continues on Cefepime. She is on full liquids and tolerating well.  ? ?Review of Systems:  ?Constitutional: + decreased appetite, denies fever, chills  ?HEENT: denies cough or congestion  ?Respiratory: denies any shortness of breath  ?Cardiovascular: denies chest pain or palpitations  ?Gastrointestinal: + abdominal pain, denied N/V ?Genitourinary: denies burning with urination or urinary frequency ?Musculoskeletal: + Back pain, + hip pain ? ?Vital signs in last 24 hours: [min-max] current  ?Temp:  [97.9 ?F (36.6 ?C)-98.2 ?F (36.8 ?C)] 97.9 ?F (36.6 ?C) (03/23 0554) ?Pulse Rate:  [80-93] 80 (03/23 0554) ?Resp:  [14-20] 20 (03/23 0554) ?BP: (131-174)/(47-73) 174/71 (03/23 0554) ?SpO2:  [96 %-98 %] 97 % (03/23 0554)     Height: '5\' 3"'$  (160 cm) Weight: 59 kg BMI (Calculated): 23.03  ? ?Intake/Output last 2 shifts:  ?03/22 0701 - 03/23 0700 ?In: 300 [I.V.:100; IV Piggyback:200] ?Out: -   ? ?Physical Exam:  ?Constitutional: alert, cooperative and no distress  ?HENT: normocephalic without obvious abnormality  ?Eyes: PERRL, EOM's grossly intact and symmetric  ?Respiratory: breathing non-labored at rest  ?Cardiovascular: regular rate and sinus rhythm  ?Gastrointestinal: Abdomen is soft, she is mildly tender in epigastrium with deep palpation, no evidence of distension. She is certainly without evidence of peritonitis  ? ? ?Labs:  ? ?  Latest Ref Rng & Units 05/20/2021  ?  4:20 AM 05/18/2021  ?  6:19 AM 05/17/2021  ?  8:00 AM  ?CBC  ?WBC 4.0 - 10.5 K/uL 7.7   6.8   9.9    ?Hemoglobin  12.0 - 15.0 g/dL 10.2   9.9   10.9    ?Hematocrit 36.0 - 46.0 % 31.2   30.8   35.0    ?Platelets 150 - 400 K/uL 350   351   465    ? ? ?  Latest Ref Rng & Units 05/20/2021  ?  4:20 AM 05/18/2021  ?  6:19 AM 05/17/2021  ?  8:00 AM  ?CMP  ?Glucose 70 - 99 mg/dL 109   96   105    ?BUN 8 - 23 mg/dL '9   17   18    '$ ?Creatinine 0.44 - 1.00 mg/dL 0.49   0.58   0.51    ?Sodium 135 - 145 mmol/L 138   137   137    ?Potassium 3.5 - 5.1 mmol/L 4.1   3.4   3.9    ?Chloride 98 - 111 mmol/L 109   108   108    ?CO2 22 - 32 mmol/L '23   22   19    '$ ?Calcium 8.9 - 10.3 mg/dL 9.1   8.6   9.1    ? ? ? ?Imaging studies: No new pertinent imaging studies ? ? ?Assessment/Plan: (ICD-10's: K26.5) ?88 y.o. female doing well clinically admitted with duodenal diverticulum, with likely enteritis/duodenitis and prepyloric ulcer without evidence of noncontained perforation.   ? ? - For now, I think it is reasonable to continue full liquids for now + nutritional supplementation. Should be okay to ADAT when patient more amenable.  ? -  Continue IV PPI ? - Continue IV Abx for now (Cefepime) ?- Monitor abdominal examination   ? - Pain control prn; seems to be biggest barrier right now ? - No indication for surgical interventions ? - Further management per primary service; we will follow  ? ?All of the above findings and recommendations were discussed with the patient, and the medical team, and all of patient's questions were answered to her expressed satisfaction. ? ?-- ?Edison Simon, PA-C [facility] ?05/21/2021, 6:52 AM ?[phone] ?M-F: 7am - 4pm ? ?

## 2021-05-21 NOTE — Progress Notes (Signed)
[facility] Progress Note ? ?Patient: Sherry [name]    BTD:[id number]  DOA: 05/14/2021    ?Date of Service: the patient was seen and examined on 05/21/2021 ? ?Brief hospital course: ?88 year old female with history of hypertension and recent hospitalization for back pain and spinal stenosis who presented to the emergency room on 05/14/2021 with severe abdominal pain and found to have a gastric perforation.  Admitted to the hospitalist service with general surgery following.  Being treated conservatively with IV PPI as well as pain medication and initially n.p.o., but then started on clear liquids 3/20 and diet slowly advanced.   ? ?Assessment and Plan: ?Assessment and Plan: ?* Duodenitis ?Repeat CAT scan more and surgery evaluation point towards more of duodenitis than ulcer with perforation, managing conservatively.  On IV PPI and pain medication with diet slowly being advanced.  Currently on full liquids which for now we will continue. ? ?Abdominal pain ?Continue oxycodone 10 mg every 4 hours as needed and as needed IV Dilaudid. ? ?Essential hypertension ?Continue Norvasc and Benicar substitute (Avapro) and hydrochlorothiazide.  Blood pressures have been trending upward.  BNP within normal limits.  Likely in part due to pain.   We will add hydralazine as needed ? ?Spinal stenosis at L4-L5 level ?Pt advised to avoid all NSAIDS and take non nsaid based pain control with tylenol.  Currently on pain medications for duodenal perforation.  Added gabapentin back.  Physical therapy consultation. ? ? ?Thrombocytosis ?Likely acute phase reactant with perforated gastric ulcer.  Platelet count normalized today to 351. ? ?Anemia of chronic disease ?More likely anemia of chronic disease with ferritin above 100. ? ? ? ? ? ? ?Body mass index is 23.03 kg/m?.  ?  ?   ? ?Consultants: ?General surgery ? ?Procedures: ?None ? ?Antimicrobials: ?IV cefepime 3/17 - 3/22 ?IV Unasyn 3/22-present ? ?Code Status: Full  code ? ? ?Subjective: Patient doing okay, still some abdominal pain radiating to her back. ? ?Objective: ?Mildly elevated blood pressures ?Vitals:  ? 05/21/21 0751 05/21/21 1159  ?BP: (!) 128/59 (!) 159/49  ?Pulse: 81 83  ?Resp: 18 18  ?Temp: 98.7 ?F (37.1 ?C) 98.4 ?F (36.9 ?C)  ?SpO2: 99% 98%  ? ? ?Intake/Output Summary (Last 24 hours) at 05/21/2021 1452 ?Last data filed at 05/21/2021 1300 ?Gross per 24 hour  ?Intake 300 ml  ?Output 200 ml  ?Net 100 ml  ? ? ?Filed Weights  ? 05/14/21 1947  ?Weight: 59 kg  ? ?Body mass index is 23.03 kg/m?. ? ?Exam: ? ?General: Alert and oriented x2, no acute distress ?HEENT: Normocephalic, atraumatic, mucous membranes slightly dry ?Cardiovascular: Regular rate and rhythm, S1-S2 ?Respiratory: Clear to auscultation bilaterally ?Abdomen: Soft, minimally distended, minimal tenderness, hypoactive bowel sounds ?Musculoskeletal: No clubbing or cyanosis or edema ?Skin: No skin breaks, tears or lesions ?Psychiatry: Appropriate, no evidence of psychoses ?Neurology: No focal deficits ? ?Data Reviewed: ?Normal BNP of 24 ? ?Disposition:  ?Status is: Inpatient ?Remains inpatient appropriate because: Advancement of diet and decreasing pain medications ?  ? ?Anticipated discharge date: 3/25 ? ?Remaining issues to be resolved so that patient can be discharged: Advancement of diet and decreasing pain medications ? ? ?Family Communication: Husband at the bedside ?DVT Prophylaxis: ?SCDs Start: 05/15/21 0041 ? ? ? ?Author: ?Annita Brod ,MD ?05/21/2021 2:52 PM ? ?To reach On-call, see care teams to locate the attending and reach out via [email or web address]. ?Between 7PM-7AM, please contact night-coverage ?If you still have difficulty reaching the attending provider, please page  the [facility] (Director on Call) for [facility] on amion for assistance. ? ?

## 2021-05-21 NOTE — Evaluation (Addendum)
Physical Therapy Treatment ?Patient Details ?Name: Sherry [name] ?MRN: [id number] ?DOB: Apr 23, 1930 ?Today's Date: 05/21/2021 ? ?History of Present Illness ? Patient is an 88 year old female who presented to [facility] ED due to epigastric area pain that radiated into her chest. Patient admitted for a perforated duodenal ulcer. PMH (+) for HTN.  ?Clinical Impression ? Pt resting in bed upon PT arrival; received pain meds prior to therapy.  During session pt modified independent with bed mobility; CGA with transfers using RW; and CGA ambulating 35 feet with RW.  Limited distance ambulating d/t c/o dizziness (BP 145/71 post ambulation laying in bed); symptoms improved resting in bed.  Pain 7/10 low back, abdomen and L LE (pt reports pain did not change during session).  Will continue to focus on strengthening and progressive functional mobility per pt tolerance.  ? ?Recommendations for follow up therapy are one component of a multi-disciplinary discharge planning process, led by the attending physician.  Recommendations may be updated based on patient status, additional functional criteria and insurance authorization. ? ?Follow Up Recommendations Home health PT ? ?  ?Assistance Recommended at Discharge Intermittent Supervision/Assistance  ?Patient can return home with the following ? Assistance with cooking/housework;Assist for transportation;Help with stairs or ramp for entrance;A little help with walking and/or transfers;A little help with bathing/dressing/bathroom ? ?  ?Equipment Recommendations Rolling walker (2 wheels);BSC/3in1;Wheelchair (measurements PT);Wheelchair cushion (measurements PT)  ?Recommendations for Other Services ?    ?  ?Functional Status Assessment Patient has had a recent decline in their functional status and demonstrates the ability to make significant improvements in function in a reasonable and predictable amount of time.  ? ?  ?Precautions / Restrictions Precautions ?Precautions:  Fall ?Restrictions ?Weight Bearing Restrictions: No  ? ?  ? ?Mobility ? Bed Mobility ?Overal bed mobility: Needs Assistance ?Bed Mobility: Supine to Sit, Sit to Supine ?  ?  ?Supine to sit: Modified independent (Device/Increase time), HOB elevated ?Sit to supine: Modified independent (Device/Increase time), HOB elevated ?  ?General bed mobility comments: increased effort to perform on own; use of bed rail ?  ? ?Transfers ?Overall transfer level: Needs assistance ?Equipment used: Rolling walker (2 wheels) ?Transfers: Sit to/from Stand ?Sit to Stand: Min guard ?  ?  ?  ?  ?  ?General transfer comment: increased effort to stand from bed; vc's for UE placement ?  ? ?Ambulation/Gait ?Ambulation/Gait assistance: Min guard ?Gait Distance (Feet): 35 Feet ?Assistive device: Rolling walker (2 wheels) ?  ?Gait velocity: decreased ?  ?  ?General Gait Details: antalgic; decreased stance time L LE (pt reporting d/t L LE pain); mild unsteadiness (CGA for safety) ? ?Stairs ?  ?  ?  ?  ?  ? ?Wheelchair Mobility ?  ? ?Modified Rankin (Stroke Patients Only) ?  ? ?  ? ?Balance Overall balance assessment: Needs assistance ?Sitting-balance support: No upper extremity supported, Feet supported ?Sitting balance-Leahy Scale: Good ?Sitting balance - Comments: steady sitting reaching within BOS ?  ?Standing balance support: Single extremity supported, Reliant on assistive device for balance ?Standing balance-Leahy Scale: Fair ?Standing balance comment: steady standing with at least single UE support ?  ?  ?  ?  ?  ?  ?  ?  ?  ?  ?  ?   ? ? ? ?Pertinent Vitals/Pain Pain Assessment ?Pain Assessment: 0-10 ?Pain Score: 7  ?Pain Location: abdomen, back, posterior L LE ?Pain Descriptors / Indicators: Aching, Discomfort, Grimacing, Guarding ?Pain Intervention(s): Limited activity within patient's tolerance, Monitored during  session, Premedicated before session, Repositioned ?HR and O2 on room air stable and WFL throughout treatment session.   ? ? ?Home Living   ?  ?  ?  ?  ?  ?  ?  ?  ?  ?   ?  ?Prior Function   ?  ?  ?  ?  ?  ?  ?  ?  ?  ? ? ?Hand Dominance  ?   ? ?  ?Extremity/Trunk Assessment  ?   ?  ? ?  ?  ? ?   ?Communication  ?    ?Cognition Arousal/Alertness: Awake/alert ?Behavior During Therapy: Anxious ?Overall Cognitive Status: Within Functional Limits for tasks assessed ?  ?  ?  ?  ?  ?  ?  ?  ?  ?  ?  ?  ?  ?  ?  ?  ?  ?  ?  ? ?  ?General Comments  Nursing cleared pt for participation in physical therapy.  Pt agreeable to PT session. ? ?  ?Exercises    ? ?Assessment/Plan  ?  ?PT Assessment Patient needs continued PT services  ?PT Problem List Decreased strength;Decreased activity tolerance;Decreased balance;Decreased mobility;Decreased knowledge of use of DME;Pain ? ?   ?  ?PT Treatment Interventions DME instruction;Gait training;Functional mobility training;Therapeutic activities;Therapeutic exercise;Patient/family education   ? ?PT Goals (Current goals can be found in the Care Plan section)  ?Acute Rehab PT Goals ?Patient Stated Goal: to not be in so much pain ?PT Goal Formulation: With patient ?Time For Goal Achievement: 05/30/21 ?Potential to Achieve Goals: Fair ? ?  ?Frequency Min 2X/week ?  ? ? ?Co-evaluation   ?  ?  ?  ?  ? ? ?  ?AM-PAC PT "6 Clicks" Mobility  ?Outcome Measure Help needed turning from your back to your side while in a flat bed without using bedrails?: None ?Help needed moving from lying on your back to sitting on the side of a flat bed without using bedrails?: None ?Help needed moving to and from a bed to a chair (including a wheelchair)?: A Little ?Help needed standing up from a chair using your arms (e.g., wheelchair or bedside chair)?: A Little ?Help needed to walk in hospital room?: A Little ?Help needed climbing 3-5 steps with a railing? : A Lot ?6 Click Score: 19 ? ?  ?End of Session Equipment Utilized During Treatment: Gait belt ?Activity Tolerance: Other (comment) (limited d/t c/o dizziness) ?Patient left: in  bed;with call bell/phone within reach;with bed alarm set ?Nurse Communication: Mobility status;Precautions (via white board) ?PT Visit Diagnosis: Unsteadiness on feet (R26.81);Muscle weakness (generalized) (M62.81);Difficulty in walking, not elsewhere classified (R26.2);Pain ?Pain - Right/Left: Left ?Pain - part of body: Leg;Ankle and joints of foot;Hip ?  ? ?Time: [id number] ?PT Time Calculation (min) (ACUTE ONLY): 19 min ? ? ?Charges:     ?PT Treatments ?$Gait Training: 8-22 mins ?  ?   ? ?Leitha Bleak, PT ?05/21/21, 2:48 PM ? ? ?

## 2021-05-21 NOTE — TOC Progression Note (Signed)
Transition of Care (TOC) - Progression Note  ? ? ?Patient Details  ?Name: Sherry [name] ?MRN: [id number] ?Date of Birth: 23-Mar-1930 ? ?Transition of Care (TOC) CM/SW Contact  ?Pete Pelt, RN ?Phone Number: ?05/21/2021, 3:34 PM ? ?Clinical Narrative:   Patient and spouse aware they are set up with [facility] through [facility].  Corene Cornea from [place] will come to see patient today, as family would like RN to assist with medications.  Family states they do not need DME at this time. ? ? ? ?Expected Discharge Plan:  (TBD) ?Barriers to Discharge: Barriers Resolved ? ?Expected Discharge Plan and Services ?Expected Discharge Plan:  (TBD) ?  ?Discharge Planning Services: CM Consult ?  ?Living arrangements for the past 2 months: [place] ?                ?  ?  ?  ?  ?  ?HH Arranged: PT, OT ?[place] Agency: Microbiologist ([facility]) (Per Son Gwinda Maine.) ?Date HH Agency Contacted: 05/17/21 ?Time [place]: 1950 ?Representative spoke with at [place]: Floydene Flock ? ? ?Social Determinants of Health (SDOH) Interventions ?  ? ?Readmission Risk Interventions ?   ? View : No data to display.  ?  ?  ?  ? ? ?

## 2021-05-22 ENCOUNTER — Inpatient Hospital Stay: Payer: Medicare Other

## 2021-05-22 DIAGNOSIS — I10 Essential (primary) hypertension: Secondary | ICD-10-CM | POA: Diagnosis not present

## 2021-05-22 DIAGNOSIS — K2980 Duodenitis without bleeding: Secondary | ICD-10-CM | POA: Diagnosis not present

## 2021-05-22 DIAGNOSIS — D75839 Thrombocytosis, unspecified: Secondary | ICD-10-CM | POA: Diagnosis not present

## 2021-05-22 MED ORDER — ACETAMINOPHEN 500 MG PO TABS
1000.0000 mg | ORAL_TABLET | Freq: Four times a day (QID) | ORAL | Status: DC
  Administered 2021-05-23 – 2021-05-26 (×12): 1000 mg via ORAL
  Filled 2021-05-22 (×13): qty 2

## 2021-05-22 MED ORDER — LIDOCAINE 5 % EX PTCH
1.0000 | MEDICATED_PATCH | CUTANEOUS | Status: DC
  Filled 2021-05-22 (×3): qty 1

## 2021-05-22 MED ORDER — TROLAMINE SALICYLATE 10 % EX CREA
TOPICAL_CREAM | CUTANEOUS | Status: DC | PRN
  Filled 2021-05-22 (×3): qty 85

## 2021-05-22 MED ORDER — OXYCODONE HCL 5 MG PO TABS
7.5000 mg | ORAL_TABLET | ORAL | Status: DC | PRN
  Administered 2021-05-22 – 2021-05-24 (×10): 7.5 mg via ORAL
  Filled 2021-05-22 (×10): qty 2

## 2021-05-22 NOTE — Plan of Care (Signed)

## 2021-05-22 NOTE — Progress Notes (Signed)
Cross Cover ?Patient still reporting unrelieved LL back pain after oxycodone. Increased tylenol dose and added myoflex cream  as needed to left lower back ?

## 2021-05-22 NOTE — Progress Notes (Signed)
[facility] Progress Note ? ?Patient: Sherry [name]    YKD:[id number]  DOA: 05/14/2021    ?Date of Service: the patient was seen and examined on 05/22/2021 ? ?Brief hospital course: ?88 year old female with history of hypertension and recent hospitalization for back pain and spinal stenosis who presented to the emergency room on 05/14/2021 with severe abdominal pain and found to have a gastric perforation.  Admitted to the hospitalist service with general surgery following.  Being treated conservatively with IV PPI as well as pain medication and initially n.p.o., but then started on clear liquids 3/20 and diet slowly advanced.   ? ?Assessment and Plan: ?Assessment and Plan: ?* Duodenitis ?Repeat CAT scan more and surgery evaluation point towards more of duodenitis than ulcer with perforation, managing conservatively.  On IV PPI and pain medication with diet slowly being advanced.  Starting to taper back pain medications.  We will advance to soft bland diet. ? ?Abdominal pain ?Continue oxycodone 10 mg every 4 hours as needed and as needed IV Dilaudid.  Noted mildly elevated lipase level at 58.  Will recheck in a few days.  Although patient notes no bowel movement for a number of days, she has been on clear and full liquids only.  Abdominal x-ray notes gas but no significant amount of stool. ? ?Essential hypertension ?Continue Norvasc and Benicar substitute (Avapro) and hydrochlorothiazide.  Blood pressures have been trending upward.  BNP within normal limits.  Likely in part due to pain.   We will add hydralazine as needed ? ?Spinal stenosis at L4-L5 level ?Pt advised to avoid all NSAIDS and take non nsaid based pain control with tylenol.  Currently on pain medications for duodenal perforation.  Added gabapentin back.  Physical therapy consultation. ? ? ?Thrombocytosis ?Likely acute phase reactant given abdominal issues.  Platelet count now normalized. ? ?Anemia of chronic disease ?More likely anemia of  chronic disease with ferritin above 100. ? ? ? ? ? ? ?Body mass index is 23.03 kg/m?.  ?  ?   ? ?Consultants: ?General surgery ? ?Procedures: ?None ? ?Antimicrobials: ?IV cefepime 3/17 - 3/22 ?IV Unasyn 3/22-present ? ?Code Status: Full code ? ? ?Subjective: Continues to complain of some intermittent abdominal pain. ? ?Objective: ?Mildly elevated blood pressures ?Vitals:  ? 05/22/21 0508 05/22/21 0752  ?BP: (!) 152/62 (!) 152/69  ?Pulse: 86 86  ?Resp: 18 16  ?Temp: 98 ?F (36.7 ?C) 98.4 ?F (36.9 ?C)  ?SpO2: 97% 98%  ? ? ?Intake/Output Summary (Last 24 hours) at 05/22/2021 1353 ?Last data filed at 05/22/2021 1200 ?Gross per 24 hour  ?Intake 804.14 ml  ?Output --  ?Net 804.14 ml  ? ? ?Filed Weights  ? 05/14/21 1947  ?Weight: 59 kg  ? ?Body mass index is 23.03 kg/m?. ? ?Exam: ? ?General: Alert and oriented x2, no acute distress ?HEENT: Normocephalic, atraumatic, mucous membranes slightly dry ?Cardiovascular: Regular rate and rhythm, S1-S2 ?Respiratory: Clear to auscultation bilaterally ?Abdomen: Soft, minimally distended, minimal tenderness, hypoactive bowel sounds ?Musculoskeletal: No clubbing or cyanosis or edema ?Skin: No skin breaks, tears or lesions ?Psychiatry: Appropriate, no evidence of psychoses ?Neurology: No focal deficits ? ?Data Reviewed: ?Noted mildly elevated lipase level of 58. ? ?Disposition:  ?Status is: Inpatient ?Remains inpatient appropriate because: Advancement of diet and decreasing pain medications ?  ? ?Anticipated discharge date: 3/26 ? ?Remaining issues to be resolved so that patient can be discharged: Advancement of diet and decreasing pain medications ? ? ?Family Communication: Husband at the bedside ?DVT Prophylaxis: ?  SCDs Start: 05/15/21 0041 ? ? ? ?Author: ?Annita Brod ,MD ?05/22/2021 1:53 PM ? ?To reach On-call, see care teams to locate the attending and reach out via [email or web address]. ?Between 7PM-7AM, please contact night-coverage ?If you still have difficulty reaching the attending  provider, please page the [facility] (Director on Call) for [facility] on amion for assistance. ? ?

## 2021-05-22 NOTE — Progress Notes (Signed)
PT Cancellation Note ? ?Patient Details ?Name: Sherry [name] ?MRN: [id number] ?DOB: 10-Sep-1930 ? ? ?Cancelled Treatment:    Reason Eval/Treat Not Completed: Other (comment).  Pt noted to have received pain medication at 1441 and therapist attempted PT session at 1540.  Pt reports unable to work with therapy at this time d/t dizziness from pain medication; pt reports needing pain medication to wear off (so dizziness resolves) in order to be able to work with therapy.  Nurse notified.  Will re-attempt PT treatment session at a later date/time. ? ?Leitha Bleak, PT ?05/22/21, 3:54 PM ? ?

## 2021-05-22 NOTE — Care Management Important Message (Signed)
Important Message ? ?Patient Details  ?Name: Sherry [name] ?MRN: [id number] ?Date of Birth: 1931-02-18 ? ? ?Medicare Important Message Given:  Yes ? ? ? ? ?[name] ?05/22/2021, 11:14 AM ?

## 2021-05-23 DIAGNOSIS — K2980 Duodenitis without bleeding: Secondary | ICD-10-CM | POA: Diagnosis not present

## 2021-05-23 DIAGNOSIS — I10 Essential (primary) hypertension: Secondary | ICD-10-CM | POA: Diagnosis not present

## 2021-05-23 DIAGNOSIS — D75839 Thrombocytosis, unspecified: Secondary | ICD-10-CM | POA: Diagnosis not present

## 2021-05-23 LAB — LIPASE, BLOOD: Lipase: 58 U/L — ABNORMAL HIGH (ref 11–51)

## 2021-05-23 NOTE — Progress Notes (Signed)
[facility] Progress Note ? ?Patient: Sherry [name]    VHQ:[id number]  DOA: 05/14/2021    ?Date of Service: the patient was seen and examined on 05/23/2021 ? ?Brief hospital course: ?88 year old female with history of hypertension and recent hospitalization for back pain and spinal stenosis who presented to the emergency room on 05/14/2021 with severe abdominal pain and found to have a gastric perforation.  Admitted to the hospitalist service with general surgery following.  Being treated conservatively with IV PPI as well as pain medication and initially n.p.o., but then started on clear liquids 3/20 and diet slowly advanced.   ? ?Assessment and Plan: ?Assessment and Plan: ?* Duodenitis ?Repeat CAT scan more and surgery evaluation point towards more of duodenitis than ulcer with perforation, managing conservatively.  On IV PPI and pain medication with diet slowly being advanced.  Starting to taper back pain medications.  Still having some pain, but appears to be independent when she eats.  Started on soft bland diet on 3/24. ? ?Abdominal pain ?Initially placed on oxycodone 10 mg every 4 hours as needed and as needed IV Dilaudid.  Dilaudid discontinued and oxycodone down to 7.5.  Noted mildly elevated lipase level at 58.  Unchanged 2 days later.  Although patient notes no bowel movement for a number of days, she has been on clear and full liquids only.  Abdominal x-ray notes gas but no significant amount of stool. ? ?Essential hypertension ?Continue Norvasc and Benicar substitute (Avapro) and hydrochlorothiazide.  Blood pressures have been trending upward.  BNP within normal limits.  Likely in part due to pain.   We will add hydralazine as needed ? ?Spinal stenosis at L4-L5 level ?Pt advised to avoid all NSAIDS and take non nsaid based pain control with tylenol.  Currently on pain medications for duodenal perforation.  Added gabapentin back.  Physical therapy consultation. ? ? ?Thrombocytosis ?Likely acute  phase reactant given abdominal issues.  Platelet count now normalized. ? ?Anemia of chronic disease ?More likely anemia of chronic disease with ferritin above 100. ? ? ? ? ? ? ?Body mass index is 23.03 kg/m?.  ?  ?   ? ?Consultants: ?General surgery ? ?Procedures: ?None ? ?Antimicrobials: ?IV cefepime 3/17 - 3/22 ?IV Unasyn 3/22-present ? ?Code Status: Full code ? ? ?Subjective: Currently pain-free, but did note some significant pain overnight but actually did get better with Bioflex cream and Tylenol and heating pad ? ?Objective: ?Mildly elevated blood pressures ?Vitals:  ? 05/22/21 1942 05/23/21 0421  ?BP: (!) 186/59 (!) 145/60  ?Pulse: 89 81  ?Resp: 16 16  ?Temp: 98.2 ?F (36.8 ?C) 98.5 ?F (36.9 ?C)  ?SpO2: 99% 98%  ? ? ?Intake/Output Summary (Last 24 hours) at 05/23/2021 1404 ?Last data filed at 05/23/2021 0900 ?Gross per 24 hour  ?Intake 723.9 ml  ?Output 1 ml  ?Net 722.9 ml  ? ? ?Filed Weights  ? 05/14/21 1947  ?Weight: 59 kg  ? ?Body mass index is 23.03 kg/m?. ? ?Exam: ? ?General: Alert and oriented x2, no acute distress ?HEENT: Normocephalic, atraumatic, mucous membranes slightly dry ?Cardiovascular: Regular rate and rhythm, S1-S2 ?Respiratory: Clear to auscultation bilaterally ?Abdomen: Soft, minimally distended, minimal tenderness, hypoactive bowel sounds ?Musculoskeletal: No clubbing or cyanosis or edema ?Skin: No skin breaks, tears or lesions ?Psychiatry: Appropriate, no evidence of psychoses ?Neurology: No focal deficits ? ?Data Reviewed: ?Noted continued elevated lipase level of 58. ? ?Disposition:  ?Status is: Inpatient ?Remains inpatient appropriate because: Ensuring that pain can be controlled on  oral medications only as independent of her eating. ?  ? ?Anticipated discharge date: 3/26 ? ?Remaining issues to be resolved so that patient can be discharged: Advancement of diet and decreasing pain medications ? ? ?Family Communication: Updated husband on 3/24. ?DVT Prophylaxis: ?SCDs Start: 05/15/21  0041 ? ? ? ?Author: ?Annita Brod ,MD ?05/23/2021 2:04 PM ? ?To reach On-call, see care teams to locate the attending and reach out via [email or web address]. ?Between 7PM-7AM, please contact night-coverage ?If you still have difficulty reaching the attending provider, please page the [facility] (Director on Call) for [facility] on amion for assistance. ? ?

## 2021-05-24 ENCOUNTER — Inpatient Hospital Stay: Payer: Medicare Other

## 2021-05-24 DIAGNOSIS — K2980 Duodenitis without bleeding: Secondary | ICD-10-CM | POA: Diagnosis not present

## 2021-05-24 DIAGNOSIS — I10 Essential (primary) hypertension: Secondary | ICD-10-CM | POA: Diagnosis not present

## 2021-05-24 DIAGNOSIS — M5432 Sciatica, left side: Secondary | ICD-10-CM

## 2021-05-24 DIAGNOSIS — D638 Anemia in other chronic diseases classified elsewhere: Secondary | ICD-10-CM | POA: Diagnosis not present

## 2021-05-24 MED ORDER — OXYCODONE HCL 5 MG PO TABS
10.0000 mg | ORAL_TABLET | ORAL | Status: DC | PRN
  Administered 2021-05-24 – 2021-05-26 (×5): 10 mg via ORAL
  Filled 2021-05-24 (×5): qty 2

## 2021-05-24 MED ORDER — AMLODIPINE BESYLATE 5 MG PO TABS
5.0000 mg | ORAL_TABLET | Freq: Once | ORAL | Status: AC
  Administered 2021-05-24: 5 mg via ORAL
  Filled 2021-05-24: qty 1

## 2021-05-24 MED ORDER — AMLODIPINE BESYLATE 10 MG PO TABS
10.0000 mg | ORAL_TABLET | Freq: Every day | ORAL | Status: DC
  Administered 2021-05-25: 10 mg via ORAL
  Filled 2021-05-24: qty 1

## 2021-05-24 MED ORDER — ALPRAZOLAM 0.25 MG PO TABS
0.2500 mg | ORAL_TABLET | Freq: Two times a day (BID) | ORAL | Status: DC | PRN
  Administered 2021-05-24 – 2021-05-25 (×2): 0.25 mg via ORAL
  Filled 2021-05-24 (×3): qty 1

## 2021-05-24 MED ORDER — PANTOPRAZOLE SODIUM 40 MG PO TBEC
40.0000 mg | DELAYED_RELEASE_TABLET | Freq: Two times a day (BID) | ORAL | Status: DC
  Administered 2021-05-24 – 2021-05-26 (×4): 40 mg via ORAL
  Filled 2021-05-24 (×4): qty 1

## 2021-05-24 NOTE — Progress Notes (Signed)
[facility] Progress Note ? ?Patient: Sherry [name]    VOJ:[id number]  DOA: 05/14/2021    ?Date of Service: the patient was seen and examined on 05/24/2021 ? ?Brief hospital course: ?88 year old female with history of hypertension and recent hospitalization for back pain and spinal stenosis who presented to the emergency room on 05/14/2021 with severe abdominal pain and found to have a gastric perforation.  Admitted to the hospitalist service with general surgery following.  Being treated conservatively with IV PPI as well as pain medication and initially n.p.o., but then started on clear liquids 3/20 and diet slowly advanced.   ? ?Assessment and Plan: ?Assessment and Plan: ?* Duodenitis ?Repeat CAT scan more and surgery evaluation point towards more of duodenitis than ulcer with perforation, managing conservatively.  On IV PPI and pain medication with diet slowly being advanced.  Starting to taper back pain medications.  Tolerating soft bland diet.  Had bowel movement on 3/26. ? ?Abdominal pain ?Initially placed on oxycodone 10 mg every 4 hours as needed and as needed IV Dilaudid.  Dilaudid discontinued and oxycodone down to 7.5.  Noted mildly elevated lipase level at 58.  Unchanged 2 days later.  Pain now more associated with sciatica.  No abdominal pain.  Tolerating p.o.  Having bowel movement. ? ?Left sided sciatica ?Patient's main complaints of pain requiring pain medication related to her left hip and radiating down her left leg.  Patient is spending a lot of time in bed and barely gets up since being hospitalized.  In examination, this is more consistent with sciatica.  Explained to patient that the more that she gets up and walks with physical therapy and stretches, this pain will go away.  I told her this is not related to her duodenitis and she understood. ? ?Essential hypertension ?Continue Norvasc and Benicar substitute (Avapro) and hydrochlorothiazide.  Blood pressures have been trending upward.   BNP within normal limits.  Likely in part due to pain.   We will add hydralazine as needed ? ?Spinal stenosis at L4-L5 level ?Pt advised to avoid all NSAIDS and take non nsaid based pain control with tylenol.  Currently on pain medications for duodenal perforation.  Added gabapentin back.  Physical therapy consultation. ? ? ?Thrombocytosis ?Likely acute phase reactant given abdominal issues.  Platelet count now normalized. ? ?Anemia of chronic disease ?More likely anemia of chronic disease with ferritin above 100. ? ? ? ? ? ? ?Body mass index is 23.03 kg/m?.  ?  ?   ? ?Consultants: ?General surgery ? ?Procedures: ?None ? ?Antimicrobials: ?IV cefepime 3/17 - 3/22 ?IV Unasyn 3/22-present ? ?Code Status: Full code ? ? ?Subjective: Still having pain in her left hip going down her left leg.  No abdominal pain.  Tolerating p.o. ? ?Objective: ?Mildly elevated blood pressures ?Vitals:  ? 05/24/21 0356 05/24/21 0804  ?BP: (!) 165/61 (!) 179/67  ?Pulse: 80 80  ?Resp: 16 16  ?Temp: 98 ?F (36.7 ?C) 97.9 ?F (36.6 ?C)  ?SpO2: 98% 100%  ? ? ?Intake/Output Summary (Last 24 hours) at 05/24/2021 1334 ?Last data filed at 05/24/2021 9937 ?Gross per 24 hour  ?Intake 460 ml  ?Output --  ?Net 460 ml  ? ? ?Filed Weights  ? 05/14/21 1947  ?Weight: 59 kg  ? ?Body mass index is 23.03 kg/m?. ? ?Exam: ? ?General: Alert and oriented x2, no acute distress ?HEENT: Normocephalic, atraumatic, mucous membranes slightly dry ?Cardiovascular: Regular rate and rhythm, S1-S2 ?Respiratory: Clear to auscultation bilaterally ?Abdomen: Soft,  nontender, nondistended, hypoactive bowel sounds ?Musculoskeletal: No clubbing or cyanosis or edema ?Skin: No skin breaks, tears or lesions ?Psychiatry: Appropriate, no evidence of psychoses ?Neurology: No focal deficits ? ?Data Reviewed: ?Noted continued elevated lipase level of 58. ? ?Disposition:  ?Status is: Inpatient ?Remains inpatient appropriate because: Need to ambulate ?  ? ?Anticipated discharge date:  3/27 ? ?Remaining issues to be resolved so that patient can be discharged: Ambulate with physical therapy. ? ? ?Family Communication: Called husband 3/26 ?DVT Prophylaxis: ?SCDs Start: 05/15/21 0041 ? ? ? ?Author: ?Annita Brod ,MD ?05/24/2021 1:34 PM ? ?To reach On-call, see care teams to locate the attending and reach out via [email or web address]. ?Between 7PM-7AM, please contact night-coverage ?If you still have difficulty reaching the attending provider, please page the [facility] (Director on Call) for [facility] on amion for assistance. ? ?

## 2021-05-24 NOTE — Assessment & Plan Note (Addendum)
Patient's main complaints of pain requiring pain medication related to her left hip and radiating down her left leg.  Patient is spending a lot of time in bed and barely gets up since being hospitalized.  In examination, this is more consistent with sciatica.  Explained to patient that the more that she gets up and walks with physical therapy and stretches, this pain will go away.  I told her this is not related to her duodenitis and she understood.  That said, according to husband, pain has been going on for some time, even prior to this hospitalization.  It is almost always 10/10.  Previous hip x-rays done at previous hospitalization only note bone spurs.  CT of left hip unremarkable for occult fracture.  It was noted that patient was able to ambulate with minimal assistance to the bathroom, so some of this may be exaggeration of symptoms.  Have increased Neurontin. ?

## 2021-05-25 DIAGNOSIS — I10 Essential (primary) hypertension: Secondary | ICD-10-CM | POA: Diagnosis not present

## 2021-05-25 DIAGNOSIS — M5432 Sciatica, left side: Secondary | ICD-10-CM | POA: Diagnosis not present

## 2021-05-25 DIAGNOSIS — I951 Orthostatic hypotension: Secondary | ICD-10-CM | POA: Diagnosis not present

## 2021-05-25 DIAGNOSIS — K2980 Duodenitis without bleeding: Secondary | ICD-10-CM | POA: Diagnosis not present

## 2021-05-25 LAB — BASIC METABOLIC PANEL
Anion gap: 12 (ref 5–15)
BUN: 11 mg/dL (ref 8–23)
CO2: 25 mmol/L (ref 22–32)
Calcium: 9.2 mg/dL (ref 8.9–10.3)
Chloride: 101 mmol/L (ref 98–111)
Creatinine, Ser: 0.66 mg/dL (ref 0.44–1.00)
GFR, Estimated: >60 mL/min (ref >60)
Glucose, Bld: 130 mg/dL — ABNORMAL HIGH (ref 70–99)
Potassium: 3.1 mmol/L — ABNORMAL LOW (ref 3.5–5.1)
Sodium: 138 mmol/L (ref 135–145)

## 2021-05-25 MED ORDER — SODIUM CHLORIDE 0.9 % IV SOLN: INTRAVENOUS | Status: DC

## 2021-05-25 MED ORDER — GABAPENTIN 100 MG PO CAPS
200.0000 mg | ORAL_CAPSULE | Freq: Three times a day (TID) | ORAL | Status: DC
  Administered 2021-05-25 – 2021-05-26 (×3): 200 mg via ORAL
  Filled 2021-05-25 (×3): qty 2

## 2021-05-25 MED ORDER — HYDRALAZINE HCL 10 MG PO TABS
10.0000 mg | ORAL_TABLET | Freq: Three times a day (TID) | ORAL | Status: DC
Start: 2021-05-25 — End: 2021-05-26
  Administered 2021-05-25 – 2021-05-26 (×2): 10 mg via ORAL
  Filled 2021-05-25 (×3): qty 1

## 2021-05-25 MED ORDER — OXYCODONE HCL 10 MG PO TABS: 5.0000 mg | ORAL_TABLET | Freq: Three times a day (TID) | ORAL | 0 refills | Status: DC | PRN

## 2021-05-25 MED ORDER — HYDRALAZINE HCL 100 MG PO TABS: 100.0000 mg | ORAL_TABLET | Freq: Three times a day (TID) | ORAL | 1 refills | Status: DC

## 2021-05-25 MED ORDER — FUROSEMIDE 10 MG/ML IJ SOLN
20.0000 mg | Freq: Two times a day (BID) | INTRAMUSCULAR | Status: DC
  Administered 2021-05-25: 20 mg via INTRAVENOUS
  Filled 2021-05-25: qty 2

## 2021-05-25 MED ORDER — GABAPENTIN 100 MG PO CAPS: 200.0000 mg | ORAL_CAPSULE | Freq: Three times a day (TID) | ORAL | 1 refills | Status: DC

## 2021-05-25 NOTE — TOC Progression Note (Signed)
Transition of Care (TOC) - Progression Note  ? ? ?Patient Details  ?Name: Sherry [name] ?MRN: [id number] ?Date of Birth: 03/10/1930 ? ?Transition of Care (TOC) CM/SW Contact  ?Pete Pelt, RN ?Phone Number: ?05/25/2021, 2:46 PM ? ?Clinical Narrative:   Patient is going home with [facility], declines all DME as she states she can walk to the bathroom with the walker she has at home. ? ? ? ?Expected Discharge Plan:  (TBD) ?Barriers to Discharge: Barriers Resolved ? ?Expected Discharge Plan and Services ?Expected Discharge Plan:  (TBD) ?  ?Discharge Planning Services: CM Consult ?  ?Living arrangements for the past 2 months: [place] ?                ?  ?  ?  ?  ?  ?HH Arranged: PT, OT ?[place] Agency: Microbiologist ([facility]) (Per Son Gwinda Maine.) ?Date HH Agency Contacted: 05/17/21 ?Time [place]: 8250 ?Representative spoke with at [place]: Floydene Flock ? ? ?Social Determinants of Health (SDOH) Interventions ?  ? ?Readmission Risk Interventions ?   ? View : No data to display.  ?  ?  ?  ? ? ?

## 2021-05-25 NOTE — Progress Notes (Signed)
[facility] Progress Note ? ?Patient: Sherry [name]    [id number]:[id number]  DOA: 05/14/2021    ?Date of Service: the patient was seen and examined on 05/25/2021 ? ?Brief hospital course: ?88 year old female with history of hypertension and recent hospitalization for back pain and spinal stenosis who presented to the emergency room on 05/14/2021 with severe abdominal pain and found to have a gastric perforation.  Admitted to the hospitalist service with general surgery following.  Being treated conservatively with IV PPI as well as pain medication and initially n.p.o., but then started on clear liquids 3/20 and diet slowly advanced.   ? ?By 3/27, patient tolerating p.o.  Able to ambulate some.  By time of discharge, patient felt lightheaded.  Orthostatics checked and patient found to have some mild orthostasis.  Plan discharge for 3/27 canceled and patient started on some gentle IV fluids. ? ?Assessment and Plan: ?Assessment and Plan: ?* Orthostatic hypotension ?Planned discharge for 3/27.  At time of discharge, patient complained of feeling very lightheaded and dizzy.  Blood pressures checked.  Lying down, blood pressure 145/54 with heart rate of 80.  Patient unable to tolerate standing for more than a minute.  Blood pressure was checked with sitting and blood pressure of 109/56 with heart rate of 102.  We will stop diuretics.  Start gentle IV fluids.  Most of this is likely from patient laying in bed.  Recheck orthostatics in the morning. ? ?Duodenitis ?Repeat CAT scan more and surgery evaluation point towards more of duodenitis than ulcer with perforation, managing conservatively.   Normal bowel movements.  Tolerating p.o. ? ?Abdominal pain ?Initially placed on oxycodone 10 mg every 4 hours as needed and as needed IV Dilaudid.  Abdominal pain has since resolved.  Patient tolerating p.o.  Now more pain is related to her left leg in relation to sciatica.  See below. ? ?Left sided sciatica ?Patient's main  complaints of pain requiring pain medication related to her left hip and radiating down her left leg.  Patient is spending a lot of time in bed and barely gets up since being hospitalized.  In examination, this is more consistent with sciatica.  Explained to patient that the more that she gets up and walks with physical therapy and stretches, this pain will go away.  I told her this is not related to her duodenitis and she understood.  That said, according to husband, pain has been going on for some time, even prior to this hospitalization.  It is almost always 10/10.  Previous hip x-rays done at previous hospitalization only note bone spurs.  CT of left hip unremarkable for occult fracture.  It was noted that patient was able to ambulate with minimal assistance to the bathroom, so some of this may be exaggeration of symptoms. ? ?Essential hypertension ?Continue Norvasc and Benicar substitute (Avapro) and hydrochlorothiazide.  Blood pressures have been trending upward.  BNP within normal limits.  Likely in part due to pain.   We will add hydralazine as needed.  With orthostatic hypotension, have stopped diuretics and will limit blood pressure control. ? ?Spinal stenosis at L4-L5 level ?Pt advised to avoid all NSAIDS and take non nsaid based pain control with tylenol.  Currently on pain medications for duodenal perforation.  Added gabapentin back.  Physical therapy consultation. ? ? ?Thrombocytosis ?Likely acute phase reactant given abdominal issues.  Platelet count now normalized. ? ?Anemia of chronic disease ?More likely anemia of chronic disease with ferritin above 100. ? ? ? ? ? ? ?  Body mass index is 23.03 kg/m?.  ?  ?   ? ?Consultants: ?General surgery ? ?Procedures: ?None ? ?Antimicrobials: ?IV cefepime 3/17 - 3/22 ?IV Unasyn 3/22-3/27 ? ?Code Status: Full code ? ? ?Subjective: Patient complains of left hip pain.  Tolerating p.o. ?Objective: ?Noted orthostatic blood pressures. ?Vitals:  ? 05/25/21 1631 05/25/21  1712  ?BP: (!) 134/59 (!) 145/54  ?Pulse: 97 80  ?Resp:  17  ?Temp: 98.5 ?F (36.9 ?C) 98.4 ?F (36.9 ?C)  ?SpO2: 96% 97%  ? ? ?Intake/Output Summary (Last 24 hours) at 05/25/2021 1750 ?Last data filed at 05/25/2021 1000 ?Gross per 24 hour  ?Intake 677 ml  ?Output --  ?Net 677 ml  ? ?Filed Weights  ? 05/14/21 1947  ?Weight: 59 kg  ? ?Body mass index is 23.03 kg/m?. ? ?Exam: ?Seen earlier in the day. ?General: Alert and oriented x2, anxious about discharge. ?HEENT: Normocephalic, atraumatic, mucous membranes slightly dry ?Cardiovascular: Regular rate and rhythm, S1-S2 ?Respiratory: Clear to auscultation bilaterally ?Abdomen: Soft, nontender, nondistended, hypoactive bowel sounds ?Musculoskeletal: No clubbing or cyanosis or edema ?Skin: No skin breaks, tears or lesions ?Psychiatry: Appropriate, no evidence of psychoses ?Neurology: No focal deficits ? ?Data Reviewed: ?Noted potassium of 3.1. ? ?Disposition:  ?Status is: Inpatient ?Remains inpatient appropriate because: Orthostatic hypotension ?  ? ?Anticipated discharge date: 3/28 ? ?Remaining issues to be resolved so that patient can be discharged: Resolution of orthostatic hypotension ? ? ?Family Communication: Updated husband at the bedside ?DVT Prophylaxis: ?SCDs Start: 05/15/21 0041 ? ? ? ?Author: ?Annita Brod ,MD ?05/25/2021 5:50 PM ? ?To reach On-call, see care teams to locate the attending and reach out via [email or web address]. ?Between 7PM-7AM, please contact night-coverage ?If you still have difficulty reaching the attending provider, please page the [facility] (Director on Call) for [facility] on amion for assistance. ? ?

## 2021-05-25 NOTE — TOC Progression Note (Signed)
Transition of Care (TOC) - Progression Note  ? ? ?Patient Details  ?Name: Sherry [name] ?MRN: [id number] ?Date of Birth: 1930/10/19 ? ?Transition of Care (TOC) CM/SW Contact  ?Pete Pelt, RN ?Phone Number: ?05/25/2021, 5:25 PM ? ?Clinical Narrative:   Patient was prepared to discharge, stated she was dizzy.  Put back in room by nurse, [facility] notified MD.  Patient and spouse do not wish to discharge.  [employer] appeal information provided to patient, family and nurse in the event patient is still discharged, they are aware they have the right to appeal.  Risks of appeal explained (if not approved, patient will be responsible for costs.) ? ? ? ?Expected Discharge Plan:  (TBD) ?Barriers to Discharge: Barriers Resolved ? ?Expected Discharge Plan and Services ?Expected Discharge Plan:  (TBD) ?  ?Discharge Planning Services: CM Consult ?  ?Living arrangements for the past 2 months: [place] ?Expected Discharge Date: 05/25/21               ?  ?  ?  ?  ?  ?HH Arranged: PT, OT ?[place] Agency: [facility] ([facility]) (Per Son Gwinda [place].) ?Date HH Agency Contacted: 05/17/21 ?Time [place]: 6384 ?Representative spoke with at [place]: Floydene Flock ? ? ?Social Determinants of Health (SDOH) Interventions ?  ? ?Readmission Risk Interventions ?   ? View : No data to display.  ?  ?  ?  ? ? ?

## 2021-05-25 NOTE — Progress Notes (Signed)
Physical Therapy Treatment ?Patient Details ?Name: Sherry [name] ?MRN: [id number] ?DOB: 1930/11/02 ?Today's Date: 05/25/2021 ? ? ?History of Present Illness Patient is an 88 year old female who presented to [facility] ED due to epigastric area pain that radiated into her chest. Patient admitted for a perforated duodenal ulcer. PMH (+) for HTN. ? ?  ?PT Comments  ? ? Pt resting in bed upon PT arrival; pt with recent pain meds; pt agreeable to PT session.  Pain L low back, L hip, and L leg 7/10 at rest beginning of session and 8/10 at rest end of session.  During session pt modified independent with bed mobility; SBA with transfers using RW; and CGA ambulating 70 feet with RW use.  Increasing antalgic gait (decreased stance time L LE) noted with increasing distance ambulated requiring vc's to increase UE support through RW and stay closer to RW in order to off-weight L LE with UE's when advancing R LE (which improved pt's gait).  Will continue to focus on strengthening and progressive functional mobility per pt tolerance. ?  ?Recommendations for follow up therapy are one component of a multi-disciplinary discharge planning process, led by the attending physician.  Recommendations may be updated based on patient status, additional functional criteria and insurance authorization. ? ?Follow Up Recommendations ? Home health PT ?  ?  ?Assistance Recommended at Discharge Intermittent Supervision/Assistance  ?Patient can return home with the following Assistance with cooking/housework;Assist for transportation;Help with stairs or ramp for entrance;A little help with walking and/or transfers;A little help with bathing/dressing/bathroom ?  ?Equipment Recommendations ? Rolling walker (2 wheels);BSC/3in1;Wheelchair (measurements PT);Wheelchair cushion (measurements PT)  ?  ?Recommendations for Other Services   ? ? ?  ?Precautions / Restrictions Precautions ?Precautions: Fall ?Restrictions ?Weight Bearing Restrictions: No  ?   ? ?Mobility ? Bed Mobility ?Overal bed mobility: Needs Assistance ?Bed Mobility: Supine to Sit, Sit to Supine ?  ?  ?Supine to sit: Modified independent (Device/Increase time), HOB elevated ?Sit to supine: Modified independent (Device/Increase time), HOB elevated ?  ?General bed mobility comments: mild increased effort to perform on own; HOB mildly elevated ?  ? ?Transfers ?Overall transfer level: Needs assistance ?Equipment used: Rolling walker (2 wheels) ?Transfers: Sit to/from Stand ?Sit to Stand: Supervision ?  ?  ?  ?  ?  ?General transfer comment: mild increased effort to stand from bed on own but steady and safe with RW use ?  ? ?Ambulation/Gait ?Ambulation/Gait assistance: Min guard ?Gait Distance (Feet): 70 Feet ?Assistive device: Rolling walker (2 wheels) ?  ?Gait velocity: decreased ?  ?  ?General Gait Details: antalgic; decreased stance time L LE (pt reporting d/t L hip and L LE pain); steady with RW use ? ? ?Stairs ?  ?  ?  ?  ?  ? ? ?Wheelchair Mobility ?  ? ?Modified Rankin (Stroke Patients Only) ?  ? ? ?  ?Balance Overall balance assessment: Needs assistance ?Sitting-balance support: No upper extremity supported, Feet supported ?Sitting balance-Leahy Scale: Normal ?Sitting balance - Comments: steady sitting reaching outside BOS ?  ?Standing balance support: Single extremity supported, Reliant on assistive device for balance ?Standing balance-Leahy Scale: Good ?Standing balance comment: steady standing reaching within BOS ?  ?  ?  ?  ?  ?  ?  ?  ?  ?  ?  ?  ? ?  ?Cognition Arousal/Alertness: Awake/alert ?Behavior During Therapy: Anxious ?Overall Cognitive Status: Within Functional Limits for tasks assessed ?  ?  ?  ?  ?  ?  ?  ?  ?  ?  ?  ?  ?  ?  ?  ?  ?  ?  ?  ? ?  ?  Exercises   ? ?  ?General Comments  Nursing cleared pt for participation in physical therapy.  Pt agreeable to PT session. ?  ?  ? ?Pertinent Vitals/Pain Pain Assessment ?Pain Assessment: 0-10 ?Pain Score: 8  ?Pain Location: L low back,  L hip, posterior L LE ?Pain Descriptors / Indicators: Aching, Discomfort, Grimacing, Guarding ?Pain Intervention(s): Limited activity within patient's tolerance, Monitored during session, Premedicated before session, Repositioned ?Vitals (HR and O2 on room air) stable and WFL throughout treatment session.  ? ? ?Home Living   ?  ?  ?  ?  ?  ?  ?  ?  ?  ?   ?  ?Prior Function    ?  ?  ?   ? ?PT Goals (current goals can now be found in the care plan section) Acute Rehab PT Goals ?Patient Stated Goal: to not be in so much pain ?PT Goal Formulation: With patient ?Time For Goal Achievement: 05/30/21 ?Potential to Achieve Goals: Fair ?Progress towards PT goals: Progressing toward goals ? ?  ?Frequency ? ? ? Min 2X/week ? ? ? ?  ?PT Plan Current plan remains appropriate  ? ? ?Co-evaluation   ?  ?  ?  ?  ? ?  ?AM-PAC PT "6 Clicks" Mobility   ?Outcome Measure ? Help needed turning from your back to your side while in a flat bed without using bedrails?: None ?Help needed moving from lying on your back to sitting on the side of a flat bed without using bedrails?: None ?Help needed moving to and from a bed to a chair (including a wheelchair)?: A Little ?Help needed standing up from a chair using your arms (e.g., wheelchair or bedside chair)?: A Little ?Help needed to walk in hospital room?: A Little ?Help needed climbing 3-5 steps with a railing? : A Little ?6 Click Score: 20 ? ?  ?End of Session Equipment Utilized During Treatment: Gait belt ?Activity Tolerance: Patient limited by pain ?Patient left: in bed;with call bell/phone within reach;with bed alarm set ?Nurse Communication: Mobility status;Precautions;Other (comment) (pt's pain status) ?PT Visit Diagnosis: Unsteadiness on feet (R26.81);Muscle weakness (generalized) (M62.81);Difficulty in walking, not elsewhere classified (R26.2);Pain ?Pain - Right/Left: Left ?Pain - part of body: Leg;Ankle and joints of foot;Hip ?  ? ? ?Time: 2426-8341 ?PT Time Calculation (min) (ACUTE  ONLY): 10 min ? ?Charges:  $Gait Training: 8-22 mins          ?          ? ?Leitha Bleak, PT ?05/25/21, 10:21 AM ? ? ?

## 2021-05-25 NOTE — Care Management Important Message (Signed)
Important Message ? ?Patient Details  ?Name: Sherry [name] ?MRN: [id number] ?Date of Birth: Aug 13, 1930 ? ? ?Medicare Important Message Given:  Yes ? ? ? ? ?[name] ?05/25/2021, 3:15 PM ?

## 2021-05-25 NOTE — Assessment & Plan Note (Addendum)
Planned discharge for 3/27.  At time of discharge, patient complained of feeling very lightheaded and dizzy.  Blood pressures checked.  Lying down, blood pressure 145/54 with heart rate of 80.  Patient unable to tolerate standing for more than a minute.  Blood pressure was checked with sitting and blood pressure of 109/56 with heart rate of 102.  This is likely from prolonged immobilization and aggressive treatment of blood pressure, but only when lying down.  Diuretics were stopped.  Patient was put on gentle IV fluids.  By morning of 3/28, slightly orthostatic, but range narrowed.  By mid afternoon, 3/28, orthostatic hypotension fully resolved.  Blood pressure medications changed so that she is on ARB without diuretic and some hydralazine. ?

## 2021-05-26 DIAGNOSIS — M5432 Sciatica, left side: Secondary | ICD-10-CM | POA: Diagnosis not present

## 2021-05-26 DIAGNOSIS — D75839 Thrombocytosis, unspecified: Secondary | ICD-10-CM | POA: Diagnosis not present

## 2021-05-26 DIAGNOSIS — I951 Orthostatic hypotension: Secondary | ICD-10-CM | POA: Diagnosis not present

## 2021-05-26 DIAGNOSIS — K2980 Duodenitis without bleeding: Secondary | ICD-10-CM | POA: Diagnosis not present

## 2021-05-26 LAB — CBC
HCT: 30.4 % — ABNORMAL LOW (ref 36.0–46.0)
Hemoglobin: 9.6 g/dL — ABNORMAL LOW (ref 12.0–15.0)
MCH: 28.6 pg (ref 26.0–34.0)
MCHC: 31.6 g/dL (ref 30.0–36.0)
MCV: 90.5 fL (ref 80.0–100.0)
Platelets: 280 10*3/uL (ref 150–400)
RBC: 3.36 MIL/uL — ABNORMAL LOW (ref 3.87–5.11)
RDW: 14.6 % (ref 11.5–15.5)
WBC: 7.2 10*3/uL (ref 4.0–10.5)
nRBC: 0 % (ref 0.0–0.2)

## 2021-05-26 LAB — BASIC METABOLIC PANEL
Anion gap: 11 (ref 5–15)
BUN: 12 mg/dL (ref 8–23)
CO2: 26 mmol/L (ref 22–32)
Calcium: 9.1 mg/dL (ref 8.9–10.3)
Chloride: 102 mmol/L (ref 98–111)
Creatinine, Ser: 0.57 mg/dL (ref 0.44–1.00)
GFR, Estimated: >60 mL/min (ref >60)
Glucose, Bld: 100 mg/dL — ABNORMAL HIGH (ref 70–99)
Potassium: 3.2 mmol/L — ABNORMAL LOW (ref 3.5–5.1)
Sodium: 139 mmol/L (ref 135–145)

## 2021-05-26 MED ORDER — HYDRALAZINE HCL 10 MG PO TABS: 10.0000 mg | ORAL_TABLET | Freq: Three times a day (TID) | ORAL | 1 refills | Status: DC

## 2021-05-26 MED ORDER — OXYCODONE HCL 5 MG PO TABS
7.5000 mg | ORAL_TABLET | ORAL | Status: DC | PRN
  Administered 2021-05-26: 7.5 mg via ORAL
  Filled 2021-05-26: qty 2

## 2021-05-26 MED ORDER — IRBESARTAN 300 MG PO TABS: 300.0000 mg | ORAL_TABLET | Freq: Every day | ORAL | 1 refills | Status: DC

## 2021-05-26 NOTE — Discharge Summary (Signed)
?Physician Discharge Summary ?  ?Patient: Sherry [name] MRN: [id number] DOB: 1930-12-19  ?Admit date:     05/14/2021  ?Discharge date: 05/26/21  ?Discharge Physician: Annita Brod  ? ?PCP: Rusty Aus, MD  ? ?Recommendations at discharge:  ? ?Medication change: Patient previously on Norvasc and Benicar/HCTZ.  These medications have been discontinued.  ?New medications: Avapro 300 mg ?New medication: Hydralazine 10 mg p.o. 3 times daily ?Medication change: Patient previously on Neurontin 100 mg p.o. nightly.  This medication has been titrated up to Neurontin 200 mg p.o. 3 times daily ?New medication: OxyContin 5 to 10 mg p.o. every 6 hours as needed for her sciatic pain. ? ?Discharge Diagnoses: ?Principal Problem: ?  Orthostatic hypotension ?Active Problems: ?  Abdominal pain ?  Duodenitis ?  Left sided sciatica ?  Essential hypertension ?  Spinal stenosis at L4-L5 level ?  Thrombocytosis ?  Anemia of chronic disease ? ?Resolved Problems: ?  Benign essential hypertension ? ?Hospital Course: ?87 year old female with history of hypertension and recent hospitalization for back pain and spinal stenosis who presented to the emergency room on 05/14/2021 with severe abdominal pain and found to have a gastric perforation.  Admitted to the hospitalist service with general surgery following.  Being treated conservatively with IV PPI as well as pain medication and initially n.p.o., but then started on clear liquids 3/20 and diet slowly advanced.   ? ?By 3/27, patient tolerating p.o.  Able to ambulate some.  By time of discharge, patient felt lightheaded.  Orthostatics checked and patient found to have some mild orthostasis.  Medications adjusted.  Patient given IV fluids and by 3/28, able to be discharged. ? ?Assessment and Plan: ?* Orthostatic hypotension ?Planned discharge for 3/27.  At time of discharge, patient complained of feeling very lightheaded and dizzy.  Blood pressures checked.  Lying down, blood pressure  145/54 with heart rate of 80.  Patient unable to tolerate standing for more than a minute.  Blood pressure was checked with sitting and blood pressure of 109/56 with heart rate of 102.  This is likely from prolonged immobilization and aggressive treatment of blood pressure, but only when lying down.  Diuretics were stopped.  Patient was put on gentle IV fluids.  By morning of 3/28, slightly orthostatic, but range narrowed.  By mid afternoon, 3/28, orthostatic hypotension fully resolved.  Blood pressure medications changed so that she is on ARB without diuretic and some hydralazine. ? ?Duodenitis ?Repeat CAT scan more and surgery evaluation point towards more of duodenitis than ulcer with perforation, managing conservatively.   Normal bowel movements.  Tolerating p.o. ? ?Abdominal pain ?Initially placed on oxycodone 10 mg every 4 hours as needed and as needed IV Dilaudid.  Abdominal pain has since resolved.  Patient tolerating p.o.  Now more pain is related to her left leg in relation to sciatica.  See below. ? ?Left sided sciatica ?Patient's main complaints of pain requiring pain medication related to her left hip and radiating down her left leg.  Patient is spending a lot of time in bed and barely gets up since being hospitalized.  In examination, this is more consistent with sciatica.  Explained to patient that the more that she gets up and walks with physical therapy and stretches, this pain will go away.  I told her this is not related to her duodenitis and she understood.  That said, according to husband, pain has been going on for some time, even prior to this hospitalization.  It is almost always 10/10.  Previous hip x-rays done at previous hospitalization only note bone spurs.  CT of left hip unremarkable for occult fracture.  It was noted that patient was able to ambulate with minimal assistance to the bathroom, so some of this may be exaggeration of symptoms.  Have increased Neurontin. ? ?Essential  hypertension ?Initially had been Norvasc and Benicar substitute (Avapro) and hydrochlorothiazide.  Blood pressures have been trending upward.  BNP within normal limits.  Likely in part due to pain.   We will add hydralazine as needed.  With orthostatic hypotension, have stopped diuretics and blood pressure medications changed as above. ? ?Spinal stenosis at L4-L5 level ?Pt advised to avoid all NSAIDS and take non nsaid based pain control with tylenol.  Currently on pain medications for duodenal perforation.  Added gabapentin back.  Physical therapy consultation. ? ? ?Thrombocytosis ?Likely acute phase reactant given abdominal issues.  Platelet count now normalized. ? ?Anemia of chronic disease ?More likely anemia of chronic disease with ferritin above 100. ? ? ? ? ?  ? ?Pain control - [employer] Controlled Substance Reporting System database was reviewed. and patient was instructed, not to drive, operate heavy machinery, perform activities at heights, swimming or participation in water activities or provide baby-sitting services while on Pain, Sleep and Anxiety Medications; until their outpatient Physician has advised to do so again. Also recommended to not to take more than prescribed Pain, Sleep and Anxiety Medications.  ?Consultants: General surgery ?Procedures performed: None ?Disposition: Home health ?Diet recommendation:  ?Discharge Diet Orders (From admission, onward)  ? ?  Start     Ordered  ? 05/25/21 0000  Diet - low sodium heart healthy       ? 05/25/21 1528  ? ?  ?  ? ?  ? ?Cardiac diet ?DISCHARGE MEDICATION: ?Allergies as of 05/26/2021   ? ?   Reactions  ? Morphine   ? Other reaction(s): Other (See Comments) ?Apnea with large dose ?Respiratory Distress  ? Buprenorphine Hcl   ? Other reaction(s): Other (See Comments), Other (See Comments) ?Respiratory Distress ?Respiratory Distress  ? Codeine   ? Epinephrine   ? Other reaction(s): Other (See Comments) ?Severe anxiety; "makes my heart race" - would  like to avoid during surgery  ? Morphine And Related Other (See Comments)  ? Respiratory Distress  ? Percocet [oxycodone-acetaminophen]   ? ?  ? ?  ?Medication List  ?  ? ?STOP taking these medications   ? ?meloxicam 15 MG tablet ?Commonly known as: MOBIC ?  ?olmesartan-hydrochlorothiazide 40-12.5 MG tablet ?Commonly known as: BENICAR HCT ?  ?ondansetron 4 MG disintegrating tablet ?Commonly known as: ZOFRAN-ODT ?  ?oxyCODONE-acetaminophen 5-325 MG tablet ?Commonly known as: PERCOCET/ROXICET ?  ? ?  ? ?TAKE these medications   ? ?acetaminophen 500 MG tablet ?Commonly known as: TYLENOL ?Take 500-1,000 mg by mouth every 6 (six) hours as needed for mild pain or fever. ?  ?amLODipine 10 MG tablet ?Commonly known as: NORVASC ?Take 1 tablet (10 mg total) by mouth daily. ?  ?B-12 1000 MCG Caps ?Take 1,000 mcg by mouth daily. ?  ?Biotin 5 MG Tabs ?Take 5 mg by mouth daily. ?  ?cholecalciferol 25 MCG (1000 UNIT) tablet ?Commonly known as: VITAMIN D ?Take 2,000 Units by mouth daily. ?  ?diclofenac Sodium 1 % Gel ?Commonly known as: VOLTAREN ?Apply 2 g topically every 6 (six) hours as needed (pain). ?  ?gabapentin 100 MG capsule ?Commonly known as: NEURONTIN ?Take 2 capsules (  200 mg total) by mouth 3 (three) times daily. ?What changed:  ?how much to take ?when to take this ?  ?hydrALAZINE 10 MG tablet ?Commonly known as: APRESOLINE ?Take 1 tablet (10 mg total) by mouth every 8 (eight) hours. ?  ?irbesartan 300 MG tablet ?Commonly known as: AVAPRO ?Take 1 tablet (300 mg total) by mouth daily. ?Start taking on: May 27, 2021 ?  ?Oxycodone HCl 10 MG Tabs ?Take 0.5-1 tablets (5-10 mg total) by mouth every 8 (eight) hours as needed. ?What changed:  ?how much to take ?reasons to take this ?  ?pantoprazole 40 MG tablet ?Commonly known as: PROTONIX ?Take 40 mg by mouth daily. ?  ? ?  ? ? ?Discharge Exam: ?Danley Danker Weights  ? 05/14/21 1947  ?Weight: 59 kg  ? ?General: Alert and oriented x3.  Upset that she is being  discharged. ?Cardiovascular: Regular rate and rhythm, S1-S2 ?Lungs: Clear auscultation bilaterally ? ?Condition at discharge: good ? ?The results of significant diagnostics from this hospitalization (including imaging, microbiology,

## 2021-05-28 ENCOUNTER — Inpatient Hospital Stay (HOSPITAL_COMMUNITY)
Admission: EM | Admit: 2021-05-28 | Discharge: 2021-06-02 | DRG: 552 | Disposition: A | Discharge status: Home Health | Payer: Medicare Other | Attending: Family Medicine | Admitting: Family Medicine

## 2021-05-28 ENCOUNTER — Emergency Department (HOSPITAL_COMMUNITY): Payer: Medicare Other

## 2021-05-28 ENCOUNTER — Other Ambulatory Visit: Payer: Self-pay

## 2021-05-28 ENCOUNTER — Encounter (HOSPITAL_COMMUNITY): Payer: Self-pay

## 2021-05-28 DIAGNOSIS — Z8249 Family history of ischemic heart disease and other diseases of the circulatory system: Secondary | ICD-10-CM

## 2021-05-28 DIAGNOSIS — E876 Hypokalemia: Secondary | ICD-10-CM

## 2021-05-28 DIAGNOSIS — M4807 Spinal stenosis, lumbosacral region: Secondary | ICD-10-CM | POA: Diagnosis present

## 2021-05-28 DIAGNOSIS — Z888 Allergy status to other drugs, medicaments and biological substances status: Secondary | ICD-10-CM

## 2021-05-28 DIAGNOSIS — M549 Dorsalgia, unspecified: Secondary | ICD-10-CM | POA: Diagnosis present

## 2021-05-28 DIAGNOSIS — Z91148 Patient's other noncompliance with medication regimen for other reason: Secondary | ICD-10-CM | POA: Diagnosis present

## 2021-05-28 DIAGNOSIS — M25552 Pain in left hip: Secondary | ICD-10-CM

## 2021-05-28 DIAGNOSIS — K2980 Duodenitis without bleeding: Secondary | ICD-10-CM | POA: Diagnosis present

## 2021-05-28 DIAGNOSIS — M5416 Radiculopathy, lumbar region: Principal | ICD-10-CM

## 2021-05-28 DIAGNOSIS — R29898 Other symptoms and signs involving the musculoskeletal system: Secondary | ICD-10-CM

## 2021-05-28 DIAGNOSIS — M5116 Intervertebral disc disorders with radiculopathy, lumbar region: Principal | ICD-10-CM | POA: Diagnosis present

## 2021-05-28 DIAGNOSIS — Z79899 Other long term (current) drug therapy: Secondary | ICD-10-CM

## 2021-05-28 DIAGNOSIS — M48061 Spinal stenosis, lumbar region without neurogenic claudication: Secondary | ICD-10-CM | POA: Diagnosis present

## 2021-05-28 DIAGNOSIS — Z885 Allergy status to narcotic agent status: Secondary | ICD-10-CM

## 2021-05-28 DIAGNOSIS — I10 Essential (primary) hypertension: Secondary | ICD-10-CM | POA: Diagnosis present

## 2021-05-28 DIAGNOSIS — H9193 Unspecified hearing loss, bilateral: Secondary | ICD-10-CM | POA: Diagnosis present

## 2021-05-28 LAB — CBC WITH DIFFERENTIAL/PLATELET
Abs Immature Granulocytes: 0.03 10*3/uL (ref 0.00–0.07)
Basophils Absolute: 0 10*3/uL (ref 0.0–0.1)
Basophils Relative: 1 %
Eosinophils Absolute: 0.1 10*3/uL (ref 0.0–0.5)
Eosinophils Relative: 1 %
HCT: 30.4 % — ABNORMAL LOW (ref 36.0–46.0)
Hemoglobin: 10 g/dL — ABNORMAL LOW (ref 12.0–15.0)
Immature Granulocytes: 1 %
Lymphocytes Relative: 29 %
Lymphs Abs: 1.7 10*3/uL (ref 0.7–4.0)
MCH: 30.3 pg (ref 26.0–34.0)
MCHC: 32.9 g/dL (ref 30.0–36.0)
MCV: 92.1 fL (ref 80.0–100.0)
Monocytes Absolute: 0.4 10*3/uL (ref 0.1–1.0)
Monocytes Relative: 7 %
Neutro Abs: 3.5 10*3/uL (ref 1.7–7.7)
Neutrophils Relative %: 61 %
Platelets: 267 10*3/uL (ref 150–400)
RBC: 3.3 MIL/uL — ABNORMAL LOW (ref 3.87–5.11)
RDW: 14.4 % (ref 11.5–15.5)
WBC: 5.7 10*3/uL (ref 4.0–10.5)
nRBC: 0 % (ref 0.0–0.2)

## 2021-05-28 LAB — BASIC METABOLIC PANEL
Anion gap: 6 (ref 5–15)
BUN: 8 mg/dL (ref 8–23)
CO2: 28 mmol/L (ref 22–32)
Calcium: 9 mg/dL (ref 8.9–10.3)
Chloride: 106 mmol/L (ref 98–111)
Creatinine, Ser: 0.43 mg/dL — ABNORMAL LOW (ref 0.44–1.00)
GFR, Estimated: >60 mL/min (ref >60)
Glucose, Bld: 97 mg/dL (ref 70–99)
Potassium: 3.2 mmol/L — ABNORMAL LOW (ref 3.5–5.1)
Sodium: 140 mmol/L (ref 135–145)

## 2021-05-28 LAB — MAGNESIUM: Magnesium: 1.2 mg/dL — ABNORMAL LOW (ref 1.7–2.4)

## 2021-05-28 MED ORDER — PANTOPRAZOLE SODIUM 40 MG PO TBEC
40.0000 mg | DELAYED_RELEASE_TABLET | Freq: Every day | ORAL | Status: DC
  Administered 2021-05-28 – 2021-05-29 (×2): 40 mg via ORAL
  Filled 2021-05-28 (×2): qty 1

## 2021-05-28 MED ORDER — POTASSIUM CHLORIDE CRYS ER 20 MEQ PO TBCR
40.0000 meq | EXTENDED_RELEASE_TABLET | Freq: Once | ORAL | Status: AC
  Administered 2021-05-28: 40 meq via ORAL
  Filled 2021-05-28: qty 2

## 2021-05-28 MED ORDER — IRBESARTAN 300 MG PO TABS
300.0000 mg | ORAL_TABLET | Freq: Every day | ORAL | Status: DC
  Administered 2021-05-28 – 2021-06-02 (×6): 300 mg via ORAL
  Filled 2021-05-28 (×6): qty 1

## 2021-05-28 MED ORDER — HYDROMORPHONE HCL 1 MG/ML IJ SOLN
0.5000 mg | INTRAMUSCULAR | Status: DC | PRN
  Administered 2021-05-29 (×4): 0.5 mg via INTRAVENOUS
  Filled 2021-05-28 (×4): qty 0.5

## 2021-05-28 MED ORDER — HYDROMORPHONE HCL 1 MG/ML IJ SOLN
0.5000 mg | Freq: Once | INTRAMUSCULAR | Status: DC
Start: 2021-05-28 — End: 2021-05-28
  Filled 2021-05-28: qty 1

## 2021-05-28 MED ORDER — HYDROMORPHONE HCL 1 MG/ML IJ SOLN
INTRAMUSCULAR | Status: AC
  Filled 2021-05-28: qty 1

## 2021-05-28 MED ORDER — ACETAMINOPHEN 325 MG PO TABS
325.0000 mg | ORAL_TABLET | Freq: Once | ORAL | Status: DC
Start: 2021-05-28 — End: 2021-05-28

## 2021-05-28 MED ORDER — ONDANSETRON HCL 4 MG/2ML IJ SOLN
4.0000 mg | Freq: Once | INTRAMUSCULAR | Status: AC
  Administered 2021-05-28: 4 mg via INTRAVENOUS

## 2021-05-28 MED ORDER — ENOXAPARIN SODIUM 40 MG/0.4ML IJ SOSY
40.0000 mg | PREFILLED_SYRINGE | INTRAMUSCULAR | Status: DC
  Administered 2021-05-29 – 2021-06-02 (×4): 40 mg via SUBCUTANEOUS
  Filled 2021-05-28 (×5): qty 0.4

## 2021-05-28 MED ORDER — ACETAMINOPHEN 500 MG PO TABS
1000.0000 mg | ORAL_TABLET | Freq: Once | ORAL | Status: AC
  Administered 2021-05-28: 1000 mg via ORAL
  Filled 2021-05-28: qty 2

## 2021-05-28 MED ORDER — HYDROMORPHONE HCL 1 MG/ML IJ SOLN
0.5000 mg | Freq: Once | INTRAMUSCULAR | Status: AC
  Administered 2021-05-28: 0.5 mg via INTRAVENOUS

## 2021-05-28 MED ORDER — HYDROMORPHONE HCL 1 MG/ML IJ SOLN
0.5000 mg | Freq: Once | INTRAMUSCULAR | Status: AC
  Administered 2021-05-28: 0.5 mg via INTRAVENOUS
  Filled 2021-05-28: qty 1

## 2021-05-28 MED ORDER — NALOXONE HCL 0.4 MG/ML IJ SOLN: 0.4000 mg | INTRAMUSCULAR | Status: DC | PRN

## 2021-05-28 MED ORDER — ACETAMINOPHEN 500 MG PO TABS
1000.0000 mg | ORAL_TABLET | Freq: Four times a day (QID) | ORAL | Status: DC | PRN
  Administered 2021-05-29 – 2021-05-31 (×6): 1000 mg via ORAL
  Filled 2021-05-28 (×6): qty 2

## 2021-05-28 MED ORDER — HYDRALAZINE HCL 20 MG/ML IJ SOLN: 5.0000 mg | INTRAMUSCULAR | Status: DC | PRN

## 2021-05-28 MED ORDER — HYDRALAZINE HCL 10 MG PO TABS
10.0000 mg | ORAL_TABLET | Freq: Three times a day (TID) | ORAL | Status: DC
  Administered 2021-05-28 – 2021-05-31 (×9): 10 mg via ORAL
  Filled 2021-05-28 (×9): qty 1

## 2021-05-28 MED ORDER — ONDANSETRON HCL 4 MG/2ML IJ SOLN
INTRAMUSCULAR | Status: AC
  Filled 2021-05-28: qty 2

## 2021-05-28 MED ORDER — OXYCODONE HCL 5 MG PO TABS
5.0000 mg | ORAL_TABLET | Freq: Three times a day (TID) | ORAL | Status: DC | PRN
  Administered 2021-05-28 – 2021-05-29 (×2): 10 mg via ORAL
  Filled 2021-05-28 (×2): qty 2

## 2021-05-28 NOTE — ED Provider Notes (Signed)
Care of patient handed off to me from [place] PA-C at change of shift.  Please see her note for full HPI, physical exam and work-up thus far.  Briefly this is a 88 year old female who presents to the emergency department with low back pain that radiates to the left leg.  She has been having progressively worsening pain since February and is now having weakness and difficulty ambulating with the left leg. ?Physical Exam  ?BP (!) 170/67   Pulse 79   Temp 97.9 ?F (36.6 ?C) (Oral)   Resp 16   SpO2 100%  ? ?Physical Exam ?Vitals and nursing note reviewed.  ?Constitutional:   ?   General: She is not in acute distress. ?   Appearance: Normal appearance. She is not ill-appearing or toxic-appearing.  ?HENT:  ?   Head: Normocephalic and atraumatic.  ?Eyes:  ?   General: No scleral icterus. ?   Extraocular Movements: Extraocular movements intact.  ?Cardiovascular:  ?   Pulses: Normal pulses.  ?Pulmonary:  ?   Effort: Pulmonary effort is normal. No respiratory distress.  ?Abdominal:  ?   Palpations: Abdomen is soft.  ?Musculoskeletal:  ?   Cervical back: Neck supple. No rigidity or tenderness.  ?Skin: ?   General: Skin is warm and dry.  ?   Capillary Refill: Capillary refill takes less than 2 seconds.  ?   Findings: No rash.  ?Neurological:  ?   Mental Status: She is alert and oriented to person, place, and time.  ?   GCS: GCS eye subscore is 4. GCS verbal subscore is 5. GCS motor subscore is 6.  ?   Cranial Nerves: Cranial nerves 2-12 are intact.  ?   Sensory: Sensory deficit present.  ?   Motor: Weakness present.  ?   Comments: Describes numbness to the left lower extremity compared to the right ? ?Weakness with dorsiflexion of the left foot as well as flexion of the left hip  ?Psychiatric:     ?   Mood and Affect: Mood normal.     ?   Behavior: Behavior normal.     ?   Thought Content: Thought content normal.     ?   Judgment: Judgment normal.  ? ? ?Procedures  ?Procedures ? ?ED Course / MDM  ? ?Clinical Course as of  05/28/21 1743  ?Thu May 28, 2021  ?5366 Basic metabolic panel(!) [AH]  ?1617 Potassium(!): 3.2 [AH]  ?1617 CBC with Differential(!) [AH]  ?1617 Hemoglobin(!): 10.0 [AH]  ?  ?Clinical Course User Index ?[AH] Margarita Mail, PA-C  ? ?Medical Decision Making ?Amount and/or Complexity of Data Reviewed ?Labs: ordered. Decision-making details documented in ED Course. ?Radiology: ordered. ? ?Risk ?OTC drugs. ?Prescription drug management. ?Decision regarding hospitalization. ? ? ?MRI ?IMPRESSION:  ?1. Transitional lumbosacral anatomy as above. Note that today's  ?numbering is different than that used on the prior MRI.  ?2. Mild enlargement of a left subarticular disc extrusion at L4-5  ?with potential left L4 nerve root impingement. Moderate to severe  ?bilateral lateral recess and left neural foraminal stenosis at this  ?level.  ?3. Unchanged disc and facet degeneration elsewhere as above  ? ?1740: Consulted Dr. Christella Noa with neurosurgery who will come and speak with the patient. ?1920: Spoke with Dr. Christella Noa who recommends that the patient be admitted for pain control as well as ensuring follow-up appointment with orthopedic at [facility] ?1940: Spoke with Dr. Marlowe Sax, MD, hospitalist, who will admit the patient at this time. ?  ?  Mickie Hillier, PA-C ?05/28/21 1954 ? ?  ?Jeanell Sparrow, DO ?05/29/21 0123 ? ?

## 2021-05-28 NOTE — ED Notes (Signed)
Patient transported to MRI 

## 2021-05-28 NOTE — ED Notes (Signed)
Pt is resting at this time

## 2021-05-28 NOTE — ED Notes (Signed)
Neuro surgery at bedside.

## 2021-05-28 NOTE — Consult Note (Signed)
Reason for Consult:lumbar stenosis, left lower extremity pain ?Referring Physician: ED ? ?Sherry [name] is an 88 y.o. female.  ?HPI: whom has been hospitalized twice within the last 30 days. She has complained of left hip and back pain. She also was found to have a duodenal perforation which is being treated conservatively.  ?She came to [facility] today due to increasing pain in her back and left lower extremity. She when seen at [facility] at the end of February was supposed to follow up with the Neurosurgery service at [facility]. They did consult on Sherry [name] while there. She has stenosis present, and an MRI was performed and reviewed. No surgery was offered and physiatry was recommended.  ?Admitted to [facility] again on 3/16 for the duodenal ulcer. Sherry [name] was encouraged to pursue physical therapy upon discharge on 3/28. She came in to [facility] today due to continued pain in the left lower extremity. I was called for evaluation. With further discussion she and her husband expressed their desire to return to the orthopaedic spine surgeon who has been taking care of her closed displaced C2 fracture, Dr. Carolynn Sayers.  ? ?Past Medical History:  ?Diagnosis Date  ? Actinic keratosis   ? Hypertension   ? ? ?Past Surgical History:  ?Procedure Laterality Date  ? BACK SURGERY    ? IR INJECT/THERA/INC NEEDLE/CATH/PLC EPI/LUMB/SAC W/IMG  04/29/2021  ? MOHS SURGERY    ? ? ?Family History  ?Problem Relation Age of Onset  ? Hypertension Father   ? ? ?Social History:  reports that she has never smoked. She has never used smokeless tobacco. She reports current alcohol use. She reports that she does not use drugs. ? ?Allergies:  ?Allergies  ?Allergen Reactions  ? Morphine   ?  Other reaction(s): Other (See Comments) ?Apnea with large dose ?Respiratory Distress ?  ? Buprenorphine Hcl   ?  Other reaction(s): Other (See Comments), Other (See Comments) ?Respiratory Distress ?Respiratory Distress ?  ? Codeine   ? Epinephrine   ?  Other reaction(s): Other  (See Comments) ?Severe anxiety; "makes my heart race" - would like to avoid during surgery  ? Morphine And Related Other (See Comments)  ?  Respiratory Distress ?  ? Percocet [Oxycodone-Acetaminophen]   ? ? ?Medications: I have reviewed the patient's current medications. ? ?Results for orders placed or performed during the hospital encounter of 05/28/21 (from the past 48 hour(s))  ?Basic metabolic panel     Status: Abnormal  ? Collection Time: 05/28/21  2:24 PM  ?Result Value Ref Range  ? Sodium 140 135 - 145 mmol/L  ? Potassium 3.2 (L) 3.5 - 5.1 mmol/L  ? Chloride 106 98 - 111 mmol/L  ? CO2 28 22 - 32 mmol/L  ? Glucose, Bld 97 70 - 99 mg/dL  ?  Comment: Glucose reference range applies only to samples taken after fasting for at least 8 hours.  ? BUN 8 8 - 23 mg/dL  ? Creatinine, Ser 0.43 (L) 0.44 - 1.00 mg/dL  ? Calcium 9.0 8.9 - 10.3 mg/dL  ? GFR, Estimated >60 >60 mL/min  ?  Comment: (NOTE) ?Calculated using the CKD-EPI Creatinine Equation (2021) ?  ? Anion gap 6 5 - 15  ?  Comment: Performed at [facility], [place] [address] [place], [place] [address]  ?CBC with Differential     Status: Abnormal  ? Collection Time: 05/28/21  2:24 PM  ?Result Value Ref Range  ? WBC 5.7 4.0 - 10.5 K/uL  ?  RBC 3.30 (L) 3.87 - 5.11 MIL/uL  ? Hemoglobin 10.0 (L) 12.0 - 15.0 g/dL  ? HCT 30.4 (L) 36.0 - 46.0 %  ? MCV 92.1 80.0 - 100.0 fL  ? MCH 30.3 26.0 - 34.0 pg  ? MCHC 32.9 30.0 - 36.0 g/dL  ? RDW 14.4 11.5 - 15.5 %  ? Platelets 267 150 - 400 K/uL  ? nRBC 0.0 0.0 - 0.2 %  ? Neutrophils Relative % 61 %  ? Neutro Abs 3.5 1.7 - 7.7 K/uL  ? Lymphocytes Relative 29 %  ? Lymphs Abs 1.7 0.7 - 4.0 K/uL  ? Monocytes Relative 7 %  ? Monocytes Absolute 0.4 0.1 - 1.0 K/uL  ? Eosinophils Relative 1 %  ? Eosinophils Absolute 0.1 0.0 - 0.5 K/uL  ? Basophils Relative 1 %  ? Basophils Absolute 0.0 0.0 - 0.1 K/uL  ? Immature Granulocytes 1 %  ? Abs Immature Granulocytes 0.03 0.00 - 0.07 K/uL  ?  Comment: Performed at [facility], [place] [address], [place], [place] [address]  ? ? ?MR Lumbar Spine Wo Contrast ? ?Result Date: 05/28/2021 ?CLINICAL DATA:  Lumbar radiculopathy.  Left hip and leg pain. EXAM: MRI LUMBAR SPINE WITHOUT CONTRAST TECHNIQUE: Multiplanar, multisequence MR imaging of the lumbar spine was performed. No intravenous contrast was administered. COMPARISON:  Lumbar spine MRI 04/26/2021. Chest radiographs 05/14/2021. CT abdomen and pelvis 05/17/2021. FINDINGS: Segmentation: There is transitional lumbosacral anatomy. Based on the comparison studies, there are 11 full sized pairs of ribs, small ribs at T12, 4 lumbar vertebrae, and a transitional segment which is considered a partially sacralized L5 with rudimentary L5-S1 disc. This numbering differs from that used on the prior MRI. Alignment: Unchanged slight retrolisthesis of T12 on L1, L1 on L2, and L2 on L3 and grade 1 anterolisthesis of L3 on L4 and L4 on L5. Mild lumbar levoscoliosis. Vertebrae: No fracture, suspicious marrow lesion, or significant marrow edema. Multilevel chronic degenerative endplate changes. Conus medullaris and cauda equina: Conus extends to the L1 level. Conus and cauda equina appear normal. Paraspinal and other soft tissues: Unremarkable. Disc levels: Disc desiccation throughout the lumbar and included lower thoracic spine with widespread disc space narrowing which is most severe at L3-4. T11-12: Only imaged sagittally. Mild disc bulging and moderate facet arthrosis without evidence of significant stenosis. T12-L1: Only imaged sagittally. Mild disc bulging and moderate facet arthrosis without evidence of significant stenosis. L1-2: Circumferential disc bulging eccentric to the left and moderate facet and ligamentum flavum hypertrophy result in mild left greater than right lateral recess stenosis and mild right and moderate left neural foraminal stenosis without spinal stenosis, unchanged. L2-3: Circumferential disc bulging and severe facet and ligamentum flavum  hypertrophy result in mild bilateral lateral recess stenosis and mild bilateral neural foraminal stenosis without spinal stenosis, unchanged. L3-4: Anterolisthesis with bulging uncovered disc, a right foraminal disc protrusion, and severe facet and ligamentum flavum hypertrophy result in mild spinal stenosis, mild-to-moderate bilateral lateral recess stenosis, and severe right and mild left neural foraminal stenosis, unchanged. L4-5: Anterolisthesis with bulging uncovered disc, a small left subarticular disc extrusion with slight cranial migration, and severe facet and ligamentum flavum hypertrophy result in moderate spinal stenosis, moderate to severe bilateral lateral recess stenosis, and mild-to-moderate right and moderate to severe left neural foraminal stenosis. The disc extrusion has mildly enlarged and may result in left L4 nerve root impingement. The L5 nerve roots may be affected in the lateral recesses bilaterally. L5-S1: Transitional anatomy with rudimentary  disc.  No stenosis. IMPRESSION: 1. Transitional lumbosacral anatomy as above. Note that today's numbering is different than that used on the prior MRI. 2. Mild enlargement of a left subarticular disc extrusion at L4-5 with potential left L4 nerve root impingement. Moderate to severe bilateral lateral recess and left neural foraminal stenosis at this level. 3. Unchanged disc and facet degeneration elsewhere as above. Electronically Signed   By: Logan Bores M.D.   On: 05/28/2021 17:00   ? ?Review of Systems  ?Constitutional:  Positive for activity change.  ?HENT: Negative.    ?Eyes: Negative.   ?Respiratory: Negative.    ?Cardiovascular: Negative.   ?Gastrointestinal:  Positive for abdominal pain.  ?Endocrine: Negative.   ?Genitourinary: Negative.   ?Musculoskeletal:  Positive for back pain.  ?Skin: Negative.   ?Allergic/Immunologic: Negative.   ?Neurological:  Positive for weakness.  ?     Left lower extremity pain  ?Hematological: Negative.    ?Psychiatric/Behavioral: Negative.    ?Blood pressure (!) 160/76, pulse 74, temperature 97.9 ?F (36.6 ?C), temperature source Oral, resp. rate 13, SpO2 100 %. ?Physical Exam ?Constitutional:   ?   General: She

## 2021-05-28 NOTE — Assessment & Plan Note (Signed)
Mild. ?-Replace potassium.  Check magnesium level and replace if low. ?

## 2021-05-28 NOTE — Assessment & Plan Note (Signed)
Patient complaining of severe left hip pain radiating to her left leg, associated with numbness and tingling.  Also reporting lower back pain.    Symptoms ongoing for several weeks.  On exam, she is able to lift her left leg up from the bed against gravity, sensation to light touch intact, however, weakness noted on dorsiflexion of the left foot. MRI of lumbar spine showing transitional lumbosacral anatomy.  Mild enlargement of the left subarticular disc extrusion at L4-5 with potential left L4 nerve root impingement.  Moderate to severe bilateral lateral recess and left neural foraminal stenosis at this level.  Unchanged disc and facet degeneration elsewhere. Neurosurgery consulted and recommended admission for pain control and ensuring follow-up appointment with orthopedics at [facility]. ?-Pain management: Continue home oxycodone 5-10 mg every 8 hours as needed and home gabapentin 200 mg 3 times a day.  Continue Tylenol as needed.  Order IV Dilaudid 0.5 mg every 4 hours as needed for severe pain. ?-PT/OT eval, fall precautions ?-Outpatient orthopedics follow-up at [facility] ?

## 2021-05-28 NOTE — ED Triage Notes (Signed)
Pt from home for eval of ongoing L hip pain radiating down leg. Denies injury. Hx of same.  ?

## 2021-05-28 NOTE — Assessment & Plan Note (Addendum)
Stable.  Continue Protonix 40 mg daily

## 2021-05-28 NOTE — ED Notes (Signed)
Dr. Rathore at bedside 

## 2021-05-28 NOTE — H&P (Signed)
?History and Physical  ? ? ?Sherry [name] NGE:[id number] DOB: 1930-05-06 DOA: 05/28/2021 ? ?PCP: Rusty Aus, MD ? ?Patient coming from: Home ? ?Chief Complaint: Left hip pain ? ?HPI: Sherry [name] is a 88 y.o. female with medical history significant of hypertension, back pain, spinal stenosis.  Recently admitted 3/16-3/28 for severe abdominal pain.  Found to have a gastric perforation and treated conservatively.  Repeat CAT scan and surgery evaluation pointed toward more of duodenitis than ulcer with perforation.  Also during this hospitalization patient had orthostasis and her home blood pressure medication regimen was adjusted.  Amlodipine, olmesartan, and hydrochlorothiazide were discontinued.  She is started on irbesartan 300 mg daily and hydralazine 10 mg 3 times daily.  In addition, patient complained of severe left hip pain radiating down her left leg which was felt to be related to sciatica.  CT of left hip revealed moderate osteoarthritis but no fracture or dislocation.  Dose of gabapentin was increased and she was prescribed oxycodone. ? ?Patient returns to the ED today complaining of ongoing severe lower back/left hip pain radiating down left leg.  Slightly hypertensive on arrival.  No fever or leukocytosis.  Hemoglobin 10.0, stable.  Potassium 3.2.   ? ?MRI of lumbar spine showing transitional lumbosacral anatomy.  Mild enlargement of the left subarticular disc extrusion at L4-5 with potential left L4 nerve root impingement.  Moderate to severe bilateral lateral recess and left neural foraminal stenosis at this level.  Unchanged disc and facet degeneration elsewhere. ? ?Neurosurgery consulted and recommended admission for pain control and ensuring follow-up appointment with orthopedics at [facility].  Patient was given Tylenol, Dilaudid, and Zofran. ? ?History per patient: She reports severe left hip pain radiating down her left leg and associated with left leg numbness and tingling.   Also reports pain in her lower back.  States this pain started 5 weeks ago and has been getting progressively worse.  She is having difficulty walking.  Denies saddle anesthesia, bowel or bladder dysfunction.  Denies fevers.  States she was just admitted to the hospital for abdominal pain due to a gastric ulcer, pain has now improved.  Her chief complaint at this time as her severe left hip pain which has persisted despite taking oxycodone, gabapentin, and Tylenol at home.  States she is seen by orthopedics at [facility] due to history of cervical spine fractures but not for her hip problem.  No other complaints.  Denies cough, shortness of breath, or chest pain.  Repeatedly requesting pain medications. ? ?Review of Systems:  ?Review of Systems  ?All other systems reviewed and are negative. ? ?Past Medical History:  ?Diagnosis Date  ? Actinic keratosis   ? Hypertension   ? ? ?Past Surgical History:  ?Procedure Laterality Date  ? BACK SURGERY    ? IR INJECT/THERA/INC NEEDLE/CATH/PLC EPI/LUMB/SAC W/IMG  04/29/2021  ? MOHS SURGERY    ? ? ? reports that she has never smoked. She has never used smokeless tobacco. She reports current alcohol use. She reports that she does not use drugs. ? ?Allergies  ?Allergen Reactions  ? Morphine   ?  Other reaction(s): Other (See Comments) ?Apnea with large dose ?Respiratory Distress ?  ? Buprenorphine Hcl   ?  Other reaction(s): Other (See Comments), Other (See Comments) ?Respiratory Distress ?Respiratory Distress ?  ? Codeine   ? Epinephrine   ?  Other reaction(s): Other (See Comments) ?Severe anxiety; "makes my heart race" - would like to avoid during surgery  ?  Morphine And Related Other (See Comments)  ?  Respiratory Distress ?  ? Percocet [Oxycodone-Acetaminophen]   ? ? ?Family History  ?Problem Relation Age of Onset  ? Hypertension Father   ? ? ?Prior to Admission medications   ?Medication Sig Start Date End Date Taking? Authorizing Provider  ?acetaminophen (TYLENOL) 500 MG tablet Take  1,000 mg by mouth every 6 (six) hours as needed for mild pain or fever.   Yes [provider]  ?amLODipine (NORVASC) 10 MG tablet Take 1 tablet (10 mg total) by mouth daily. 05/06/21  Yes Fritzi Mandes, MD  ?Biotin 5 MG TABS Take 5 mg by mouth daily.   Yes [provider]  ?cholecalciferol (VITAMIN D) 25 MCG (1000 UNIT) tablet Take 2,000 Units by mouth daily.   Yes [provider]  ?Cyanocobalamin (B-12) 1000 MCG CAPS Take 1,000 mcg by mouth daily.   Yes [provider]  ?diclofenac Sodium (VOLTAREN) 1 % GEL Apply 2 g topically every 6 (six) hours as needed (pain). 05/06/21  Yes Fritzi Mandes, MD  ?gabapentin (NEURONTIN) 100 MG capsule Take 2 capsules (200 mg total) by mouth 3 (three) times daily. 05/25/21  Yes Annita Brod, MD  ?hydrALAZINE (APRESOLINE) 10 MG tablet Take 1 tablet (10 mg total) by mouth every 8 (eight) hours. 05/26/21  Yes Annita Brod, MD  ?irbesartan (AVAPRO) 300 MG tablet Take 1 tablet (300 mg total) by mouth daily. 05/27/21  Yes Annita Brod, MD  ?Oxycodone HCl 10 MG TABS Take 0.5-1 tablets (5-10 mg total) by mouth every 8 (eight) hours as needed. ?Patient taking differently: Take 10 mg by mouth daily as needed. 05/25/21  Yes Annita Brod, MD  ?pantoprazole (PROTONIX) 40 MG tablet Take 40 mg by mouth daily. 05/11/21  Yes [provider]  ? ? ?Physical Exam: ?Vitals:  ? 05/28/21 1540 05/28/21 1915 05/28/21 1919 05/28/21 2059  ?BP: (!) 175/73 (!) 191/68 (!) 160/76 (!) 192/83  ?Pulse: 81 74  90  ?Resp: 18 13    ?Temp:    98.5 ?F (36.9 ?C)  ?TempSrc:    Oral  ?SpO2: 99% 100%  99%  ? ? ?Physical Exam ?Vitals reviewed.  ?Constitutional:   ?   General: She is not in acute distress. ?HENT:  ?   Head: Normocephalic and atraumatic.  ?Eyes:  ?   Extraocular Movements: Extraocular movements intact.  ?   Conjunctiva/sclera: Conjunctivae normal.  ?Cardiovascular:  ?   Rate and Rhythm: Normal rate and regular rhythm.  ?   Pulses: Normal pulses.   ?Pulmonary:  ?   Effort: Pulmonary effort is normal. No respiratory distress.  ?   Breath sounds: Normal breath sounds. No wheezing or rales.  ?Abdominal:  ?   General: Bowel sounds are normal. There is no distension.  ?   Palpations: Abdomen is soft.  ?   Tenderness: There is no abdominal tenderness. There is no guarding.  ?Musculoskeletal:     ?   General: No swelling or tenderness.  ?   Cervical back: Normal range of motion and neck supple.  ?Skin: ?   General: Skin is warm and dry.  ?Neurological:  ?   Mental Status: She is alert and oriented to person, place, and time.  ?   Comments: Strength 5 out of 5 in the right lower extremity and sensation to light touch intact. ?Left lower extremity: Examination limited due to patient complaining of pain.  Able to raise her leg up from the bed  against gravity.  Weakness noted on dorsiflexion of the foot.  Sensation to light touch intact.  ?  ? ?Labs on Admission: I have personally reviewed following labs and imaging studies ? ?CBC: ?Recent Labs  ?Lab 05/26/21 ?0617 05/28/21 ?1424  ?WBC 7.2 5.7  ?NEUTROABS  --  3.5  ?HGB 9.6* 10.0*  ?HCT 30.4* 30.4*  ?MCV 90.5 92.1  ?PLT 280 267  ? ?Basic Metabolic Panel: ?Recent Labs  ?Lab 05/25/21 ?1030 05/26/21 ?0617 05/28/21 ?1424  ?NA 138 139 140  ?K 3.1* 3.2* 3.2*  ?CL 101 102 106  ?CO2 '25 26 28  '$ ?GLUCOSE 130* 100* 97  ?BUN '11 12 8  '$ ?CREATININE 0.66 0.57 0.43*  ?CALCIUM 9.2 9.1 9.0  ? ?GFR: ?CrCl cannot be calculated (Unknown ideal weight.). ?Liver Function Tests: ?No results for input(s): AST, ALT, ALKPHOS, BILITOT, PROT, ALBUMIN in the last 168 hours. ?Recent Labs  ?Lab 05/23/21 ?5188  ?LIPASE 58*  ? ?No results for input(s): AMMONIA in the last 168 hours. ?Coagulation Profile: ?No results for input(s): INR, PROTIME in the last 168 hours. ?Cardiac Enzymes: ?No results for input(s): CKTOTAL, CKMB, CKMBINDEX, TROPONINI in the last 168 hours. ?BNP (last 3 results) ?No results for input(s): PROBNP in the last 8760 hours. ?HbA1C: ?No  results for input(s): HGBA1C in the last 72 hours. ?CBG: ?No results for input(s): GLUCAP in the last 168 hours. ?Lipid Profile: ?No results for input(s): CHOL, HDL, LDLCALC, TRIG, CHOLHDL, LDLDIRECT in the last 72

## 2021-05-28 NOTE — ED Provider Notes (Signed)
?[facility] ?Provider Note ? ? ?CSN: [id number] ?Arrival date & time: 05/28/21  1007 ? ?  ? ?History ? ?Chief Complaint  ?Patient presents with  ? Leg Pain  ? ? ?Sherry [name] is a 88 y.o. female who has a history of severe left-sided low back pain with radiation down the left leg.  She has been seen at [facility] many times.  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  Patient reports that she was not having the severe pain prior to the end of February.  She reports that her pain has not improved and has in fact worsened.  She has been on multiple narcotic pain medications.  She reports that her leg is becoming weaker and she is unable to ambulate on it or lift her ankle.  She states that she was not given referral to neurosurgery and has not been able to follow with anyone she just keeps coming back to the hospital.  She was recently admitted to the hospital with an upper GI bleed after taking NSAIDs for her pain and was discharged 2 days ago, advised to discontinue her oxycodone and meloxicam and was changed oxycodone 10 mg tablets 0.5-1 every 8 hours and gabapentin 200 mg 3 times daily without any relief of her pain.  She denies saddle anesthesia, new bowel or bladder incontinence. ? ?The history is provided by the patient, medical records and the spouse. No language interpreter was used.  ?Leg Pain ? ?  ? ?Home Medications ?Prior to Admission medications   ?Medication Sig Start Date End Date Taking? Authorizing Provider  ?acetaminophen (TYLENOL) 500 MG tablet Take 500-1,000 mg by mouth every 6 (six) hours as needed for mild pain or fever.    [provider]  ?amLODipine (NORVASC) 10 MG tablet Take 1 tablet (10 mg total) by mouth daily. 05/06/21   Fritzi Mandes, MD  ?Biotin 5 MG TABS Take 5 mg by mouth daily.     [provider]  ?cholecalciferol (VITAMIN D) 25 MCG (1000 UNIT) tablet Take 2,000 Units by mouth daily.    [provider]  ?Cyanocobalamin (B-12) 1000 MCG CAPS Take 1,000 mcg by mouth daily.    [provider]  ?diclofenac Sodium (VOLTAREN) 1 % GEL Apply 2 g topically every 6 (six) hours as needed (pain). 05/06/21   Fritzi Mandes, MD  ?gabapentin (NEURONTIN) 100 MG capsule Take 2 capsules (200 mg total) by mouth 3 (three) times daily. 05/25/21   Annita Brod, MD  ?hydrALAZINE (APRESOLINE) 10 MG tablet Take 1 tablet (10 mg total) by mouth every 8 (eight) hours. 05/26/21   Annita Brod, MD  ?irbesartan (AVAPRO) 300 MG tablet Take 1 tablet (300 mg total) by mouth daily. 05/27/21   Annita Brod, MD  ?Oxycodone HCl 10 MG TABS Take 0.5-1 tablets (5-10 mg total) by mouth every 8 (eight) hours as needed. 05/25/21   Annita Brod, MD  ?pantoprazole (PROTONIX) 40 MG tablet Take 40 mg by mouth daily. 05/11/21   [provider]  ?   ? ?Allergies    ?Morphine, Buprenorphine hcl, Codeine, Epinephrine, Morphine and related, and Percocet [oxycodone-acetaminophen]   ? ?Review of Systems   ?Review of Systems ? ?Physical Exam ?Updated Vital Signs ?BP (!) 159/74 (BP Location: Right Arm)   Pulse 87   Temp 97.9 ?  F (36.6 ?C) (Oral)   Resp 16   SpO2 97%  ?Physical Exam ?Vitals and nursing note reviewed.  ?Constitutional:   ?   General: She is not in acute distress. ?   Appearance: She is well-developed. She is not diaphoretic.  ?   Comments: Patient appears extremely uncomfortable  ?HENT:  ?   Head: Normocephalic and atraumatic.  ?   Right Ear: External ear normal.  ?   Left Ear: External ear normal.  ?   Nose: Nose normal.  ?   Mouth/Throat:  ?   Mouth: Mucous membranes are moist.  ?Eyes:  ?   General: No scleral icterus. ?   Conjunctiva/sclera: Conjunctivae normal.  ?Cardiovascular:  ?   Rate and Rhythm: Normal rate and regular rhythm.  ?   Heart sounds: Normal heart sounds. No  murmur heard. ?  No friction rub. No gallop.  ?Pulmonary:  ?   Effort: Pulmonary effort is normal. No respiratory distress.  ?   Breath sounds: Normal breath sounds.  ?Abdominal:  ?   General: Bowel sounds are normal. There is no distension.  ?   Palpations: Abdomen is soft. There is no mass.  ?   Tenderness: There is no abdominal tenderness. There is no guarding.  ?Musculoskeletal:  ?   Cervical back: Normal range of motion.  ?   Comments: Examination of the lumbar spine and lower extremities reveals no midline spinal tenderness, no significant spasm to the paraspinal muscles.  Patient has weakness at the ankle and is unable to dorsiflex.  She has normal strength with plantarflexion on the left.  4+ patellar reflex on the left as compared to the right which is a 2+. ?No clonus at the ankle  ?Skin: ?   General: Skin is warm and dry.  ?Neurological:  ?   Mental Status: She is alert and oriented to person, place, and time.  ?Psychiatric:     ?   Behavior: Behavior normal.  ? ? ?ED Results / Procedures / Treatments   ?Labs ?(all labs ordered are listed, but only abnormal results are displayed) ?Labs Reviewed - No data to display ? ?EKG ?None ? ?Radiology ?No results found. ? ?Procedures ?Procedures  ? ? ?Medications Ordered in ED ?Medications - No data to display ? ?ED Course/ Medical Decision Making/ A&P ?Clinical Course as of 05/28/21 1618  ?Thu May 28, 2021  ?7846 Basic metabolic panel(!) [AH]  ?1617 Potassium(!): 3.2 [AH]  ?1617 CBC with Differential(!) [AH]  ?1617 Hemoglobin(!): 10.0 [AH]  ?  ?Clinical Course User Index ?[AH] Margarita Mail, PA-C  ? ?                        ?Medical Decision Making ?Patient with severe back pain radiating down the left leg.  She has required multiple doses of narcotic IV pain medication.  She is currently taking 10 mg of oxycodone and previously was not on any narcotic medications before onset of the severe back pain.  On my examination the patient has significant weakness with  dorsiflexion at the left ankle.  Case discussed with attending physician Dr. Norberta Keens who recommends repeating MRI.  MRI results are currently pending.  I reviewed these images and do not find significant change.  Suspect she will need a consult with neurosurgery.  Labs are reassuring.  I have discussed the case with PA Autry who will assume care of the patient. ? ?Amount and/or Complexity of Data Reviewed ?External  Data Reviewed: labs. ?Labs: ordered. ?Radiology: ordered. ?ECG/medicine tests: ordered. ? ?Risk ?OTC drugs. ?Prescription drug management. ? ?Final Clinical Impression(s) / ED Diagnoses ?Final diagnoses:  ?None  ? ? ?Rx / DC Orders ?ED Discharge Orders   ? ? None  ? ?  ? ? ?  ?Margarita Mail, PA-C ?05/28/21 1620 ? ?  ?Lianne Cure, DO ?11/57/26 2035 ? ?

## 2021-05-28 NOTE — Assessment & Plan Note (Addendum)
Currently hypertensive in the setting of pain and has not taken her home medications today. ?-Continue home irbesartan 300 mg daily and hydralazine 10 mg 3 times daily.   ?-Continue pain management. ?-Order as needed IV hydralazine if blood pressure continues to be elevated ?

## 2021-05-29 DIAGNOSIS — M5116 Intervertebral disc disorders with radiculopathy, lumbar region: Secondary | ICD-10-CM | POA: Diagnosis present

## 2021-05-29 DIAGNOSIS — E876 Hypokalemia: Secondary | ICD-10-CM | POA: Diagnosis present

## 2021-05-29 DIAGNOSIS — M4807 Spinal stenosis, lumbosacral region: Secondary | ICD-10-CM | POA: Diagnosis present

## 2021-05-29 DIAGNOSIS — Z885 Allergy status to narcotic agent status: Secondary | ICD-10-CM | POA: Diagnosis not present

## 2021-05-29 DIAGNOSIS — I10 Essential (primary) hypertension: Secondary | ICD-10-CM | POA: Diagnosis present

## 2021-05-29 DIAGNOSIS — Z8249 Family history of ischemic heart disease and other diseases of the circulatory system: Secondary | ICD-10-CM | POA: Diagnosis not present

## 2021-05-29 DIAGNOSIS — M549 Dorsalgia, unspecified: Secondary | ICD-10-CM | POA: Diagnosis present

## 2021-05-29 DIAGNOSIS — Z79899 Other long term (current) drug therapy: Secondary | ICD-10-CM | POA: Diagnosis not present

## 2021-05-29 DIAGNOSIS — K2980 Duodenitis without bleeding: Secondary | ICD-10-CM | POA: Diagnosis present

## 2021-05-29 DIAGNOSIS — Z91148 Patient's other noncompliance with medication regimen for other reason: Secondary | ICD-10-CM | POA: Diagnosis present

## 2021-05-29 DIAGNOSIS — Z888 Allergy status to other drugs, medicaments and biological substances status: Secondary | ICD-10-CM | POA: Diagnosis not present

## 2021-05-29 DIAGNOSIS — M25552 Pain in left hip: Secondary | ICD-10-CM | POA: Diagnosis not present

## 2021-05-29 DIAGNOSIS — H9193 Unspecified hearing loss, bilateral: Secondary | ICD-10-CM | POA: Diagnosis present

## 2021-05-29 DIAGNOSIS — M48061 Spinal stenosis, lumbar region without neurogenic claudication: Secondary | ICD-10-CM | POA: Diagnosis present

## 2021-05-29 MED ORDER — ONDANSETRON 4 MG PO TBDP
4.0000 mg | ORAL_TABLET | Freq: Three times a day (TID) | ORAL | Status: DC | PRN
  Administered 2021-05-29 – 2021-05-31 (×5): 4 mg via ORAL
  Filled 2021-05-29 (×5): qty 1

## 2021-05-29 MED ORDER — TRAMADOL HCL 50 MG PO TABS
50.0000 mg | ORAL_TABLET | Freq: Four times a day (QID) | ORAL | Status: DC | PRN
  Administered 2021-05-29: 50 mg via ORAL
  Filled 2021-05-29: qty 1

## 2021-05-29 MED ORDER — METOCLOPRAMIDE HCL 10 MG PO TABS
5.0000 mg | ORAL_TABLET | Freq: Three times a day (TID) | ORAL | Status: DC | PRN
  Administered 2021-05-30 – 2021-05-31 (×3): 5 mg via ORAL
  Filled 2021-05-29 (×3): qty 1

## 2021-05-29 MED ORDER — SODIUM CHLORIDE 0.9% FLUSH: 9.0000 mL | INTRAVENOUS | Status: DC | PRN

## 2021-05-29 MED ORDER — DICLOFENAC EPOLAMINE 1.3 % EX PTCH
1.0000 | MEDICATED_PATCH | Freq: Two times a day (BID) | CUTANEOUS | Status: DC
  Administered 2021-05-29 – 2021-05-31 (×4): 1 via TRANSDERMAL
  Filled 2021-05-29 (×5): qty 1

## 2021-05-29 MED ORDER — ONDANSETRON HCL 4 MG/2ML IJ SOLN: 4.0000 mg | Freq: Four times a day (QID) | INTRAMUSCULAR | Status: DC | PRN

## 2021-05-29 MED ORDER — HYDROMORPHONE 1 MG/ML IV SOLN
INTRAVENOUS | Status: DC
  Filled 2021-05-29: qty 30

## 2021-05-29 MED ORDER — TRAMADOL HCL 50 MG PO TABS
100.0000 mg | ORAL_TABLET | Freq: Four times a day (QID) | ORAL | Status: DC | PRN
  Administered 2021-05-29 – 2021-05-31 (×5): 100 mg via ORAL
  Filled 2021-05-29 (×5): qty 2

## 2021-05-29 MED ORDER — HYDROMORPHONE HCL 1 MG/ML IJ SOLN
1.0000 mg | INTRAMUSCULAR | Status: DC | PRN
  Administered 2021-05-29: 1 mg via INTRAVENOUS
  Filled 2021-05-29: qty 1

## 2021-05-29 MED ORDER — DIPHENHYDRAMINE HCL 12.5 MG/5ML PO ELIX: 12.5000 mg | ORAL_SOLUTION | Freq: Four times a day (QID) | ORAL | Status: DC | PRN

## 2021-05-29 MED ORDER — ONDANSETRON HCL 4 MG/2ML IJ SOLN
4.0000 mg | Freq: Once | INTRAMUSCULAR | Status: AC
  Administered 2021-05-29: 4 mg via INTRAVENOUS
  Filled 2021-05-29: qty 2

## 2021-05-29 MED ORDER — MAGNESIUM OXIDE -MG SUPPLEMENT 400 (240 MG) MG PO TABS
400.0000 mg | ORAL_TABLET | Freq: Two times a day (BID) | ORAL | Status: DC
  Administered 2021-05-29 – 2021-06-02 (×9): 400 mg via ORAL
  Filled 2021-05-29 (×9): qty 1

## 2021-05-29 MED ORDER — NALOXONE HCL 0.4 MG/ML IJ SOLN: 0.4000 mg | INTRAMUSCULAR | Status: DC | PRN

## 2021-05-29 MED ORDER — DIPHENHYDRAMINE HCL 50 MG/ML IJ SOLN: 12.5000 mg | Freq: Four times a day (QID) | INTRAMUSCULAR | Status: DC | PRN

## 2021-05-29 NOTE — TOC Progression Note (Signed)
Transition of Care (TOC) - Progression Note  ? ? ?Patient Details  ?Name: DEREONNA LENSING ?MRN: [id number] ?Date of Birth: Feb 14, 1931 ? ?Transition of Care (TOC) CM/SW Contact  ?Angelita Ingles, RN ?Phone [phone] ? ?05/29/2021, 1:40 PM ? ?Clinical Narrative:    ? ?Transition of Care (TOC) Screening Note ? ? ?Patient Details  ?Name: DORRENE BENTLY ?Date of Birth: [birth year] ? ? ?Transition of Care (TOC) CM/SW Contact:    ?Angelita Ingles, RN ?Phone Number: ?05/29/2021, 1:41 PM ? ? ? ?Transition of Care Department [facility]) has reviewed patient and no TOC needs have been identified at this time. We will continue to monitor patient advancement through interdisciplinary progression rounds.  ? ?  ?  ? ?Expected Discharge Plan and Services ?  ?  ?  ?  ?  ?                ?  ?  ?  ?  ?  ?  ?  ?  ?  ?  ? ? ?Social Determinants of Health (SDOH) Interventions ?  ? ?Readmission Risk Interventions ?   ? View : No data to display.  ?  ?  ?  ? ? ?

## 2021-05-29 NOTE — Evaluation (Signed)
Physical Therapy Evaluation ?Patient Details ?Name: KAILEIGH VISWANATHAN ?MRN: [id number] ?DOB: December 15, 1930 ?Today's Date: 05/29/2021 ? ?History of Present Illness ? 88 y.o. female presents to [facility] hospital on 05/28/2021 with left hip and back pain. Pt with recent admissions for duodenal perforation, discharged 3/28. PMH of HTN.  ?Clinical Impression ? Pt presents to PT with deficits in activity tolerance, strength, power, gait, functional mobility. Pt is significantly limited by low back pain which radiates into LLE. Pt is able to ambulate shirt distances with support of RW, however pain increases rapidly and persists after completion of mobilization. Pt will benefit from acute PT services in an effort to reduce falls risk and restore independence.   ?   ? ?Recommendations for follow up therapy are one component of a multi-disciplinary discharge planning process, led by the attending physician.  Recommendations may be updated based on patient status, additional functional criteria and insurance authorization. ? ?Follow Up Recommendations Home health PT ? ?  ?Assistance Recommended at Discharge Intermittent Supervision/Assistance  ?Patient can return home with the following ? Help with stairs or ramp for entrance;Assist for transportation;A little help with bathing/dressing/bathroom ? ?  ?Equipment Recommendations BSC/3in1  ?Recommendations for Other Services ?    ?  ?Functional Status Assessment Patient has had a recent decline in their functional status and demonstrates the ability to make significant improvements in function in a reasonable and predictable amount of time.  ? ?  ?Precautions / Restrictions Precautions ?Precautions: Fall ?Restrictions ?Weight Bearing Restrictions: No  ? ?  ? ?Mobility ? Bed Mobility ?Overal bed mobility: Needs Assistance ?Bed Mobility: Supine to Sit, Sit to Supine ?  ?  ?Supine to sit: Supervision, HOB elevated ?Sit to supine: Min assist ?  ?General bed mobility comments: minA for LE  management when returning to bed ?  ? ?Transfers ?Overall transfer level: Needs assistance ?Equipment used: Rolling walker (2 wheels) ?Transfers: Sit to/from Stand ?Sit to Stand: Supervision ?  ?  ?  ?  ?  ?  ?  ? ?Ambulation/Gait ?Ambulation/Gait assistance: Min guard ?Gait Distance (Feet): 30 Feet ?Assistive device: Rolling walker (2 wheels) ?Gait Pattern/deviations: Step-to pattern ?Gait velocity: reduced ?Gait velocity interpretation: <1.8 ft/sec, indicate of risk for recurrent falls ?  ?General Gait Details: pt with slowed step-to gait, antalgic gait on left side with reduced stance time ? ?Stairs ?  ?  ?  ?  ?  ? ?Wheelchair Mobility ?  ? ?Modified Rankin (Stroke Patients Only) ?  ? ?  ? ?Balance Overall balance assessment: Needs assistance ?Sitting-balance support: No upper extremity supported, Feet supported ?Sitting balance-Leahy Scale: Fair ?  ?  ?Standing balance support: Single extremity supported, Bilateral upper extremity supported, Reliant on assistive device for balance ?Standing balance-Leahy Scale: Poor ?  ?  ?  ?  ?  ?  ?  ?  ?  ?  ?  ?  ?   ? ? ? ?Pertinent Vitals/Pain Pain Assessment ?Pain Assessment: 0-10 ?Pain Score: 9  ?Pain Location: back and LLE ?Pain Descriptors / Indicators: Sharp ?Pain Intervention(s): Monitored during session  ? ? ?Home Living Family/patient expects to be discharged to:: Private residence ?Living Arrangements: Spouse/significant other ?Available Help at Discharge: Family;Available 24 hours/day ?Type of Home: House ?Home Access: Level entry ?  ?  ?  ?Home Layout: One level ?Home Equipment: Conservation officer, nature (2 wheels) ?   ?  ?Prior Function Prior Level of Function : Independent/Modified Independent ?  ?  ?  ?  ?  ?  ?  Mobility Comments: pt reports needing to utilize a walker at home recently due to significant pain ?  ?  ? ? ?Hand Dominance  ? Dominant Hand: Right ? ?  ?Extremity/Trunk Assessment  ? Upper Extremity Assessment ?Upper Extremity Assessment: Overall WFL for  tasks assessed ?  ? ?Lower Extremity Assessment ?Lower Extremity Assessment: Generalized weakness ?  ? ?Cervical / Trunk Assessment ?Cervical / Trunk Assessment: Kyphotic  ?Communication  ? Communication: No difficulties  ?Cognition Arousal/Alertness: Awake/alert ?Behavior During Therapy: [facility] for tasks assessed/performed ?Overall Cognitive Status: Within Functional Limits for tasks assessed ?  ?  ?  ?  ?  ?  ?  ?  ?  ?  ?  ?  ?  ?  ?  ?  ?  ?  ?  ? ?  ?General Comments General comments (skin integrity, edema, etc.): VSS on RA ? ?  ?Exercises    ? ?Assessment/Plan  ?  ?PT Assessment Patient needs continued PT services  ?PT Problem List Decreased strength;Decreased activity tolerance;Decreased balance;Decreased mobility;Pain ? ?   ?  ?PT Treatment Interventions DME instruction;Gait training;Stair training;Functional mobility training;Therapeutic activities;Therapeutic exercise;Balance training;Neuromuscular re-education;Patient/family education   ? ?PT Goals (Current goals can be found in the Care Plan section)  ?Acute Rehab PT Goals ?Patient Stated Goal: to reduce pain ?PT Goal Formulation: With patient ?Time For Goal Achievement: 06/12/21 ?Potential to Achieve Goals: Good ? ?  ?Frequency Min 3X/week ?  ? ? ?Co-evaluation   ?  ?  ?  ?  ? ? ?  ?AM-PAC PT "6 Clicks" Mobility  ?Outcome Measure Help needed turning from your back to your side while in a flat bed without using bedrails?: A Little ?Help needed moving from lying on your back to sitting on the side of a flat bed without using bedrails?: A Little ?Help needed moving to and from a bed to a chair (including a wheelchair)?: A Little ?Help needed standing up from a chair using your arms (e.g., wheelchair or bedside chair)?: A Little ?Help needed to walk in hospital room?: A Little ?Help needed climbing 3-5 steps with a railing? : Total ?6 Click Score: 16 ? ?  ?End of Session   ?Activity Tolerance: Patient limited by pain ?Patient left: in bed;with call bell/phone  within reach;with bed alarm set ?Nurse Communication: Mobility status ?PT Visit Diagnosis: Pain;Muscle weakness (generalized) (M62.81) ?Pain - Right/Left: Left ?Pain - part of body: Leg (low back radiating into LLE) ?  ? ?Time: 5573-2202 ?PT Time Calculation (min) (ACUTE ONLY): 15 min ? ? ?Charges:   PT Evaluation ?$PT Eval Low Complexity: 1 Low ?  ?  ?   ? ? ?Zenaida Niece, PT, DPT ?Acute Rehabilitation ?Pager: [phone] ?Office [phone] ? ? ?Zenaida Niece ?05/29/2021, 2:08 PM ? ?

## 2021-05-29 NOTE — Evaluation (Signed)
Patient Occupational Therapy Evaluation ?Patient Details ?Name: Sherry [name] ?MRN: [id number] ?DOB: [birth year] ?Today's Date: 05/29/2021 ? ? ?History of Present Illness 88 y.o. female presents to [facility] hospital on 05/28/2021 with left hip and back pain. Pt with recent admissions for duodenal perforation, discharged 3/28. PMH of HTN.  ? ?Clinical Impression ?  ?Patient about a half hour from pain meds, deferring out of bed currently.  She has been seen in the past for OT, and has been instructed regarding back precautions and the hip kit for improved independence, but has not purchased this.  She states her spouse is able to provide assist depending on back and leg pain.  RN notified of request for pain meds and something for nausea.  OT will follow her the acute setting, but it's unclear how much OT will have an impact, given she has declined the use of adaptive equipment, and that her spouse assists.  [facility] can be considered, if she agrees.   ?   ? ?Recommendations for follow up therapy are one component of a multi-disciplinary discharge planning process, led by the attending physician.  Recommendations may be updated based on patient status, additional functional criteria and insurance authorization.  ? ?Follow Up Recommendations ? Home health OT  ?  ?Assistance Recommended at Discharge Intermittent Supervision/Assistance  ?Patient can return home with the following   ? ?  ?Functional Status Assessment ? Patient has had a recent decline in their functional status and demonstrates the ability to make significant improvements in function in a reasonable and predictable amount of time.  ?Equipment Recommendations ? None recommended by OT  ?  ?Recommendations for Other Services   ? ? ?  ?Precautions / Restrictions Precautions ?Precautions: Fall ?Restrictions ?Weight Bearing Restrictions: No  ? ?  ? ?Mobility Bed Mobility ?  ?Bed Mobility: Sidelying to Sit, Sit to Sidelying ?  ?Sidelying to sit: Min assist ?  ?  ?Sit to  sidelying: Min assist ?  ?  ? ?Transfers ?  ?  ?  ?  ?  ?  ?  ?  ?  ?  ?  ? ?  ?Balance Overall balance assessment: Needs assistance ?Sitting-balance support: No upper extremity supported, Feet supported ?Sitting balance-Leahy Scale: Fair ?  ?  ?  ?  ?  ?  ?  ?  ?  ?  ?  ?  ?  ?  ?  ?  ?   ? ?ADL either performed or assessed with clinical judgement  ? ?ADL   ?  ?  ?Grooming: Wash/dry hands;Wash/dry face;Min guard;Sitting ?  ?  ?  ?  ?  ?Upper Body Dressing : Minimal assistance;Sitting ?  ?Lower Body Dressing: Moderate assistance;Sitting/lateral leans ?  ?  ?  ?  ?  ?  ?  ?  ?General ADL Comments: patient deferred mobility to toilet  ? ? ? ?Vision Patient Visual Report: No change from baseline ?   ?   ?Perception Perception ?Perception: Within Functional Limits ?  ?Praxis Praxis ?Praxis: Intact ?  ? ?Pertinent Vitals/Pain Pain Assessment ?Pain Assessment: Faces ?Faces Pain Scale: Hurts whole lot ?Pain Location: back and LLE ?Pain Descriptors / Indicators: Hervey Ard, Shooting ?Pain Intervention(s): Monitored during session, Patient requesting pain meds-RN notified  ? ? ? ?Hand Dominance Right ?  ?Extremity/Trunk Assessment Upper Extremity Assessment ?Upper Extremity Assessment: Overall WFL for tasks assessed ?  ?Lower Extremity Assessment ?Lower Extremity Assessment: Defer to PT evaluation ?  ?Cervical / Trunk Assessment ?Cervical /  Trunk Assessment: Kyphotic ?  ?Communication Communication ?Communication: No difficulties ?  ?Cognition Arousal/Alertness: Awake/alert ?Behavior During Therapy: [facility] for tasks assessed/performed ?Overall Cognitive Status: Within Functional Limits for tasks assessed ?  ?  ?  ?  ?  ?  ?  ?  ?  ?  ?  ?  ?  ?  ?  ?  ?  ?  ?  ?General Comments  VSS on RA ? ?  ?Exercises   ?  ?Shoulder Instructions    ? ? ?Home Living Family/patient expects to be discharged to:: Private residence ?Living Arrangements: Spouse/significant other ?Available Help at Discharge: Family;Available 24 hours/day ?Type of Home:  House ?Home Access: Level entry ?  ?  ?Home Layout: One level ?  ?  ?Bathroom Shower/Tub: Walk-in shower ?  ?Bathroom Toilet: Standard ?Bathroom Accessibility: Yes ?How Accessible: Accessible via walker ?Home Equipment: Conservation officer, nature (2 wheels) ?  ?  ?  ? ?  ?Prior Functioning/Environment Prior Level of Function : Independent/Modified Independent ?  ?  ?  ?  ?  ?  ?Mobility Comments: pt reports needing to utilize a walker at home recently due to significant pain ?ADLs Comments: Asssit with lower body ADL from spouse depending on pain. ?  ? ?  ?  ?OT Problem List: Decreased strength;Pain;Impaired balance (sitting and/or standing) ?  ?   ?OT Treatment/Interventions: Self-care/ADL training;Balance training;Therapeutic activities;DME and/or AE instruction  ?  ?OT Goals(Current goals can be found in the care plan section) Acute Rehab OT Goals ?Patient Stated Goal: patient wanting relief from back pain ?OT Goal Formulation: With patient ?Time For Goal Achievement: 06/12/21 ?Potential to Achieve Goals: Good ?ADL Goals ?Pt Will Perform Grooming: with set-up;standing ?Pt Will Perform Upper Body Dressing: with set-up;sitting ?Pt Will Perform Lower Body Dressing: with min assist;sit to/from stand;with adaptive equipment ?Pt Will Transfer to Toilet: with supervision;ambulating;regular height toilet  ?OT Frequency: Min 2X/week ?  ? ?Co-evaluation   ?  ?  ?  ?  ? ?  ?AM-PAC OT "6 Clicks" Daily Activity     ?Outcome Measure Help from another person eating meals?: None ?Help from another person taking care of personal grooming?: A Little ?Help from another person toileting, which includes using toliet, bedpan, or urinal?: A Little ?Help from another person bathing (including washing, rinsing, drying)?: A Lot ?Help from another person to put on and taking off regular upper body clothing?: A Little ?Help from another person to put on and taking off regular lower body clothing?: A Lot ?6 Click Score: 17 ?  ?End of Session Nurse  Communication: Other (comment) (requesting pain meds) ? ?Activity Tolerance: Patient limited by pain ?Patient left: in bed;with call bell/phone within reach ? ?OT Visit Diagnosis: Unsteadiness on feet (R26.81);Muscle weakness (generalized) (M62.81);Pain  ?              ?Time: 8841-6606 ?OT Time Calculation (min): 14 min ?Charges:  OT General Charges ?$OT Visit: 1 Visit ?OT Evaluation ?$OT Eval Moderate Complexity: 1 Mod ? ?05/29/2021 ? ?RP, OTR/L ? ?Acute Rehabilitation Services ? ?Office:  [phone] ? ? ?Allister Lessley D Veldon Wager ?05/29/2021, 4:01 PM ?

## 2021-05-29 NOTE — Progress Notes (Signed)
?PROGRESS NOTE ? ? ?Sherry [name]  ZDG:[id number] DOB: [birth year] DOA: 05/28/2021 ?PCP: Rusty Aus, MD  ?Brief Narrative:  ? ?68 community dwell white female lives with her husband at home ?Known history HTN spinal stenosis back pain ?Recent admission 3/16 through 3/28 with duodenitis ?Also had severe hip pain felt to be sciatica and CT showed osteoarthritis with no fracture dislocation and patient was sent home on gabapentin and oxycodone ?We presented to ED 3/30 severely low back left hip pain with radiation hemoglobin 10 ?MRI lumbar spine = lumbosacral anatomy left subarticular disc extrusion L4-5 ?Because of inability to bear weight and walk patient was seen by neurosurgery Dr. Denton Lank appears that she was supposed to see neurosurgery service in February at [facility] and was supposed to follow-up with orthopedic spine surgeon Dr. Feliberto Gottron at [facility] for C2 fracture ? ? ?Hospital-Problem based course ? ?Intractable lumbar pain ?Patient has significant nausea to oral pain meds ?She is trialed multiple low doses of IV Dilaudid without significant effect and still rates her pain as 9/10 ?Start reduced PCA Dilaudid, increase tramadol to 100 every 6 and will need to transition in the next several days to oral equivalents ?Start Flector patch ?PT OT to see ?I have reviewed the CT from 3/26 on last admission and there is no hip fracture ?Duodenitis ?We will start low-dose p.o. Reglan as she has nausea, continue pantoprazole 40 daily continue Zofran every 8 as needed ?Blood pressure elevated secondary to pain ?Continue Avapro 300 daily ?Mild hypokalemia ?Replaced-repeat labs in morning give oral magnesium today 400 twice daily ? ?DVT prophylaxis: Lovenox ?Code Status: Full  ?Family Communication: called and left message with patient's son JamesJohnson on his cell phone ?Disposition:  ?Status is: Observation ?The patient will require care spanning > 2 midnights and should be moved to inpatient because:  ? ?Intractable  pain ?  ?Consultants:  ?Neurosurgery ? ?Procedures: MRI ? ?Antimicrobials: None ? ? ?Subjective: ?Seen 3 times today-I saw the patient after 1 mg of Dilaudid and she states her pain is mildly better but still rates it as severe ?She has had nausea but has been able to keep down some food ?We have added Reglan ? ?Objective: ?Vitals:  ? 05/29/21 0311 05/29/21 0815 05/29/21 1156 05/29/21 1556  ?BP: (!) 180/84 (!) 171/77 (!) 160/69 (!) 186/63  ?Pulse: 80 82 83 82  ?Resp:  '18 18 18  '$ ?Temp: 98.3 ?F (36.8 ?C) 97.9 ?F (36.6 ?C) 98.6 ?F (37 ?C) 98 ?F (36.7 ?C)  ?TempSrc: Oral  Oral   ?SpO2: 98% 98% 99% 100%  ? ? ?Intake/Output Summary (Last 24 hours) at 05/29/2021 1632 ?Last data filed at 05/29/2021 0930 ?Gross per 24 hour  ?Intake 120 ml  ?Output --  ?Net 120 ml  ? ?There were no vitals filed for this visit. ? ?Examination: ? ?Frail white female no distress EOMI NCAT moderate dentition S1-S2 no murmur ?CTA B no added sound ?Straight leg raise on right side is quite antalgic-on the left side is able to raise up to about 40 degrees but still is painful ?Dorsiflexion is limited on both sides ? ?Data Reviewed: personally reviewed  ? ?CBC ?   ?Component Value Date/Time  ? WBC 5.7 05/28/2021 1424  ? RBC 3.30 (L) 05/28/2021 1424  ? HGB 10.0 (L) 05/28/2021 1424  ? HCT 30.4 (L) 05/28/2021 1424  ? PLT 267 05/28/2021 1424  ? MCV 92.1 05/28/2021 1424  ? MCH 30.3 05/28/2021 1424  ? MCHC 32.9 05/28/2021 1424  ?  RDW 14.4 05/28/2021 1424  ? LYMPHSABS 1.7 05/28/2021 1424  ? MONOABS 0.4 05/28/2021 1424  ? EOSABS 0.1 05/28/2021 1424  ? BASOSABS 0.0 05/28/2021 1424  ? ? ?  Latest Ref Rng & Units 05/28/2021  ?  2:24 PM 05/26/2021  ?  6:17 AM 05/25/2021  ? 10:30 AM  ?CMP  ?Glucose 70 - 99 mg/dL 97   100   130    ?BUN 8 - 23 mg/dL '8   12   11    '$ ?Creatinine 0.44 - 1.00 mg/dL 0.43   0.57   0.66    ?Sodium 135 - 145 mmol/L 140   139   138    ?Potassium 3.5 - 5.1 mmol/L 3.2   3.2   3.1    ?Chloride 98 - 111 mmol/L 106   102   101    ?CO2 22 - 32 mmol/L  '28   26   25    '$ ?Calcium 8.9 - 10.3 mg/dL 9.0   9.1   9.2    ? ? ? ?Radiology Studies: ?MR Lumbar Spine Wo Contrast ? ?Result Date: 05/28/2021 ?CLINICAL DATA:  Lumbar radiculopathy.  Left hip and leg pain. EXAM: MRI LUMBAR SPINE WITHOUT CONTRAST TECHNIQUE: Multiplanar, multisequence MR imaging of the lumbar spine was performed. No intravenous contrast was administered. COMPARISON:  Lumbar spine MRI 04/26/2021. Chest radiographs 05/14/2021. CT abdomen and pelvis 05/17/2021. FINDINGS: Segmentation: There is transitional lumbosacral anatomy. Based on the comparison studies, there are 11 full sized pairs of ribs, small ribs at T12, 4 lumbar vertebrae, and a transitional segment which is considered a partially sacralized L5 with rudimentary L5-S1 disc. This numbering differs from that used on the prior MRI. Alignment: Unchanged slight retrolisthesis of T12 on L1, L1 on L2, and L2 on L3 and grade 1 anterolisthesis of L3 on L4 and L4 on L5. Mild lumbar levoscoliosis. Vertebrae: No fracture, suspicious marrow lesion, or significant marrow edema. Multilevel chronic degenerative endplate changes. Conus medullaris and cauda equina: Conus extends to the L1 level. Conus and cauda equina appear normal. Paraspinal and other soft tissues: Unremarkable. Disc levels: Disc desiccation throughout the lumbar and included lower thoracic spine with widespread disc space narrowing which is most severe at L3-4. T11-12: Only imaged sagittally. Mild disc bulging and moderate facet arthrosis without evidence of significant stenosis. T12-L1: Only imaged sagittally. Mild disc bulging and moderate facet arthrosis without evidence of significant stenosis. L1-2: Circumferential disc bulging eccentric to the left and moderate facet and ligamentum flavum hypertrophy result in mild left greater than right lateral recess stenosis and mild right and moderate left neural foraminal stenosis without spinal stenosis, unchanged. L2-3: Circumferential disc  bulging and severe facet and ligamentum flavum hypertrophy result in mild bilateral lateral recess stenosis and mild bilateral neural foraminal stenosis without spinal stenosis, unchanged. L3-4: Anterolisthesis with bulging uncovered disc, a right foraminal disc protrusion, and severe facet and ligamentum flavum hypertrophy result in mild spinal stenosis, mild-to-moderate bilateral lateral recess stenosis, and severe right and mild left neural foraminal stenosis, unchanged. L4-5: Anterolisthesis with bulging uncovered disc, a small left subarticular disc extrusion with slight cranial migration, and severe facet and ligamentum flavum hypertrophy result in moderate spinal stenosis, moderate to severe bilateral lateral recess stenosis, and mild-to-moderate right and moderate to severe left neural foraminal stenosis. The disc extrusion has mildly enlarged and may result in left L4 nerve root impingement. The L5 nerve roots may be affected in the lateral recesses bilaterally. L5-S1: Transitional anatomy with rudimentary disc.  No stenosis. IMPRESSION: 1. Transitional lumbosacral anatomy as above. Note that today's numbering is different than that used on the prior MRI. 2. Mild enlargement of a left subarticular disc extrusion at L4-5 with potential left L4 nerve root impingement. Moderate to severe bilateral lateral recess and left neural foraminal stenosis at this level. 3. Unchanged disc and facet degeneration elsewhere as above. Electronically Signed   By: Logan Bores M.D.   On: 05/28/2021 17:00   ? ? ?Scheduled Meds: ? diclofenac  1 patch Transdermal BID  ? enoxaparin (LOVENOX) injection  40 mg Subcutaneous Q24H  ? hydrALAZINE  10 mg Oral Q8H  ? HYDROmorphone   Intravenous Q4H  ? irbesartan  300 mg Oral Daily  ? magnesium oxide  400 mg Oral BID  ? pantoprazole  40 mg Oral Daily  ? ?Continuous Infusions: ? ? LOS: 0 days  ? ?Time spent: 28 ? ?Nita Sells, MD ?[facility] ?To contact the attending  provider between 7A-7P or the covering provider during after hours 7P-7A, please log into the web site [email or web address] and access using universal  password for that web site. If you do not have the passwor

## 2021-05-30 DIAGNOSIS — M25552 Pain in left hip: Secondary | ICD-10-CM | POA: Diagnosis not present

## 2021-05-30 LAB — COMPREHENSIVE METABOLIC PANEL
ALT: 22 U/L (ref 0–44)
AST: 18 U/L (ref 15–41)
Albumin: 3.2 g/dL — ABNORMAL LOW (ref 3.5–5.0)
Alkaline Phosphatase: 53 U/L (ref 38–126)
Anion gap: 8 (ref 5–15)
BUN: 6 mg/dL — ABNORMAL LOW (ref 8–23)
CO2: 26 mmol/L (ref 22–32)
Calcium: 9.3 mg/dL (ref 8.9–10.3)
Chloride: 103 mmol/L (ref 98–111)
Creatinine, Ser: 0.46 mg/dL (ref 0.44–1.00)
GFR, Estimated: >60 mL/min (ref >60)
Glucose, Bld: 104 mg/dL — ABNORMAL HIGH (ref 70–99)
Potassium: 3.5 mmol/L (ref 3.5–5.1)
Sodium: 137 mmol/L (ref 135–145)
Total Bilirubin: 0.5 mg/dL (ref 0.3–1.2)
Total Protein: 5.6 g/dL — ABNORMAL LOW (ref 6.5–8.1)

## 2021-05-30 LAB — MAGNESIUM: Magnesium: 1.4 mg/dL — ABNORMAL LOW (ref 1.7–2.4)

## 2021-05-30 MED ORDER — LIP MEDEX EX OINT
TOPICAL_OINTMENT | CUTANEOUS | Status: DC | PRN
  Administered 2021-05-30: 75 via TOPICAL
  Filled 2021-05-30: qty 7

## 2021-05-30 MED ORDER — FAMOTIDINE 20 MG PO TABS
20.0000 mg | ORAL_TABLET | Freq: Every day | ORAL | Status: DC
  Administered 2021-05-30 – 2021-06-02 (×4): 20 mg via ORAL
  Filled 2021-05-30 (×4): qty 1

## 2021-05-30 MED ORDER — PANTOPRAZOLE SODIUM 40 MG PO TBEC
40.0000 mg | DELAYED_RELEASE_TABLET | Freq: Two times a day (BID) | ORAL | Status: DC
  Administered 2021-05-30 – 2021-06-02 (×7): 40 mg via ORAL
  Filled 2021-05-30 (×7): qty 1

## 2021-05-30 MED ORDER — HYDROMORPHONE HCL 1 MG/ML IJ SOLN
0.5000 mg | INTRAMUSCULAR | Status: DC | PRN
  Administered 2021-05-30 – 2021-05-31 (×6): 1 mg via INTRAVENOUS
  Filled 2021-05-30 (×6): qty 1

## 2021-05-30 NOTE — Progress Notes (Signed)
Patient refusing to wear nasal cannula for PCA pump. Patient stated it feels like it is suffocating her. She asked for dilaudid to be pushed into her IV to help with her pain. Informed patient she was getting dilaudid in the pump. Patient still upset and refusing to put nasal cannula on. Provided her with tylenol and tramadol for pain. Physician on call notified.  ?

## 2021-05-30 NOTE — Progress Notes (Signed)
?PROGRESS NOTE ? ? ?Sherry [name]  CWC:[id number] DOB: [birth year] DOA: 05/28/2021 ?PCP: Rusty Aus, MD  ?Brief Narrative:  ? ?54 community dwell white female lives with her husband at home ?Known history HTN spinal stenosis back pain ?Recent admission 3/16 through 3/28 with duodenitis ?Also had severe hip pain felt to be sciatica and CT showed osteoarthritis with no fracture dislocation and patient was sent home on gabapentin and oxycodone ?We presented to ED 3/30 severely low back left hip pain with radiation hemoglobin 10 ?MRI lumbar spine = lumbosacral anatomy left subarticular disc extrusion L4-5 ?Because of inability to bear weight and walk patient was seen by neurosurgery Dr. Denton Lank appears that she was supposed to see neurosurgery service in February at [facility] and was supposed to follow-up with orthopedic spine surgeon Dr. Feliberto Gottron at [facility] for C2 fracture ? ? ?Hospital-Problem based course ? ?Intractable lumbar pain ?Pain improved on scheduled tramadol every 6 prn 100 mg ? Flector patch ?Could not use PCA therefore give IV Dilaudid 0.5-1 mg every 3 as needed ?PT OT recommending home health, bedside commode/3 and 1 ?I have reviewed the CT from 3/26 on last admission and there is no hip fracture ?Duodenitis ?Increase Protonix to 40 twice daily, add Pepcid 20, will give Reglan in addition to Zofran ?Has never had specific esophageal dysmotility-was eating bites of food several days prior to admission but not much by her own admission ?Blood pressure elevated secondary to pain ?Continue Avapro 300 daily ?Mild hypokalemia ?Replaced-resolved ?Continue magnesium replacement ? ?DVT prophylaxis: Lovenox ?Code Status: Full  ?Family Communication: called and left message with patient's son JamesJohnson on his cell phone ?Disposition:  ?Status is: Observation ?The patient will require care spanning > 2 midnights and should be moved to inpatient because:  ? ?Intractable pain ?  ?Consultants:   ?Neurosurgery ? ?Procedures: MRI ? ?Antimicrobials: None ? ? ?Subjective: ? ?Nursing states pain is rated by patient as 6/10 ?She refused the PCA last night ?She tells me that she felt "suffocated by it"-overall she seems rather despondent ?Encouraged patient to use tramadol for moderate pain-have explained to her we will give IV pushes of Dilaudid for now cautiously given advanced age etc. and also explained to patient that she will probably need mobility later on today with nursing staff up to the chair-she understands and is tentatively agreeable ?I have also encouraged her to eat and told her that we will treat her duodenitis a little bit more aggressively ? ?Objective: ?Vitals:  ? 05/29/21 2341 05/29/21 2347 05/30/21 7616 05/30/21 0758  ?BP:  (!) 188/89 (!) 176/76 (!) 163/78  ?Pulse:  81 80 85  ?Resp: '13  15 18  '$ ?Temp:  98.3 ?F (36.8 ?C) 97.8 ?F (36.6 ?C) 98.2 ?F (36.8 ?C)  ?TempSrc:  Oral Oral Oral  ?SpO2: 99% 99% 97% 93%  ? ? ?Intake/Output Summary (Last 24 hours) at 05/30/2021 0946 ?Last data filed at 05/29/2021 1230 ?Gross per 24 hour  ?Intake 0 ml  ?Output --  ?Net 0 ml  ? ? ?There were no vitals filed for this visit. ? ?Examination: ? ?Frail white female-somewhat emotional ?CTA B no added sound ?Straight leg raise not tested today ?Dorsiflexion is limited on both sides ?Abdomen is soft no rebound no epigastric tenderness ? ?Data Reviewed: personally reviewed  ? ?CBC ?   ?Component Value Date/Time  ? WBC 5.7 05/28/2021 1424  ? RBC 3.30 (L) 05/28/2021 1424  ? HGB 10.0 (L) 05/28/2021 1424  ? HCT 30.4 (L) 05/28/2021  1424  ? PLT 267 05/28/2021 1424  ? MCV 92.1 05/28/2021 1424  ? MCH 30.3 05/28/2021 1424  ? MCHC 32.9 05/28/2021 1424  ? RDW 14.4 05/28/2021 1424  ? LYMPHSABS 1.7 05/28/2021 1424  ? MONOABS 0.4 05/28/2021 1424  ? EOSABS 0.1 05/28/2021 1424  ? BASOSABS 0.0 05/28/2021 1424  ? ? ?  Latest Ref Rng & Units 05/30/2021  ?  3:47 AM 05/28/2021  ?  2:24 PM 05/26/2021  ?  6:17 AM  ?CMP  ?Glucose 70 - 99 mg/dL 104    97   100    ?BUN 8 - 23 mg/dL '6   8   12    '$ ?Creatinine 0.44 - 1.00 mg/dL 0.46   0.43   0.57    ?Sodium 135 - 145 mmol/L 137   140   139    ?Potassium 3.5 - 5.1 mmol/L 3.5   3.2   3.2    ?Chloride 98 - 111 mmol/L 103   106   102    ?CO2 22 - 32 mmol/L '26   28   26    '$ ?Calcium 8.9 - 10.3 mg/dL 9.3   9.0   9.1    ?Total Protein 6.5 - 8.1 g/dL 5.6      ?Total Bilirubin 0.3 - 1.2 mg/dL 0.5      ?Alkaline Phos 38 - 126 U/L 53      ?AST 15 - 41 U/L 18      ?ALT 0 - 44 U/L 22      ? ? ? ?Radiology Studies: ?MR Lumbar Spine Wo Contrast ? ?Result Date: 05/28/2021 ?CLINICAL DATA:  Lumbar radiculopathy.  Left hip and leg pain. EXAM: MRI LUMBAR SPINE WITHOUT CONTRAST TECHNIQUE: Multiplanar, multisequence MR imaging of the lumbar spine was performed. No intravenous contrast was administered. COMPARISON:  Lumbar spine MRI 04/26/2021. Chest radiographs 05/14/2021. CT abdomen and pelvis 05/17/2021. FINDINGS: Segmentation: There is transitional lumbosacral anatomy. Based on the comparison studies, there are 11 full sized pairs of ribs, small ribs at T12, 4 lumbar vertebrae, and a transitional segment which is considered a partially sacralized L5 with rudimentary L5-S1 disc. This numbering differs from that used on the prior MRI. Alignment: Unchanged slight retrolisthesis of T12 on L1, L1 on L2, and L2 on L3 and grade 1 anterolisthesis of L3 on L4 and L4 on L5. Mild lumbar levoscoliosis. Vertebrae: No fracture, suspicious marrow lesion, or significant marrow edema. Multilevel chronic degenerative endplate changes. Conus medullaris and cauda equina: Conus extends to the L1 level. Conus and cauda equina appear normal. Paraspinal and other soft tissues: Unremarkable. Disc levels: Disc desiccation throughout the lumbar and included lower thoracic spine with widespread disc space narrowing which is most severe at L3-4. T11-12: Only imaged sagittally. Mild disc bulging and moderate facet arthrosis without evidence of significant stenosis.  T12-L1: Only imaged sagittally. Mild disc bulging and moderate facet arthrosis without evidence of significant stenosis. L1-2: Circumferential disc bulging eccentric to the left and moderate facet and ligamentum flavum hypertrophy result in mild left greater than right lateral recess stenosis and mild right and moderate left neural foraminal stenosis without spinal stenosis, unchanged. L2-3: Circumferential disc bulging and severe facet and ligamentum flavum hypertrophy result in mild bilateral lateral recess stenosis and mild bilateral neural foraminal stenosis without spinal stenosis, unchanged. L3-4: Anterolisthesis with bulging uncovered disc, a right foraminal disc protrusion, and severe facet and ligamentum flavum hypertrophy result in mild spinal stenosis, mild-to-moderate bilateral lateral recess stenosis, and severe right and  mild left neural foraminal stenosis, unchanged. L4-5: Anterolisthesis with bulging uncovered disc, a small left subarticular disc extrusion with slight cranial migration, and severe facet and ligamentum flavum hypertrophy result in moderate spinal stenosis, moderate to severe bilateral lateral recess stenosis, and mild-to-moderate right and moderate to severe left neural foraminal stenosis. The disc extrusion has mildly enlarged and may result in left L4 nerve root impingement. The L5 nerve roots may be affected in the lateral recesses bilaterally. L5-S1: Transitional anatomy with rudimentary disc.  No stenosis. IMPRESSION: 1. Transitional lumbosacral anatomy as above. Note that today's numbering is different than that used on the prior MRI. 2. Mild enlargement of a left subarticular disc extrusion at L4-5 with potential left L4 nerve root impingement. Moderate to severe bilateral lateral recess and left neural foraminal stenosis at this level. 3. Unchanged disc and facet degeneration elsewhere as above. Electronically Signed   By: Logan Bores M.D.   On: 05/28/2021 17:00    ? ? ?Scheduled Meds: ? diclofenac  1 patch Transdermal BID  ? enoxaparin (LOVENOX) injection  40 mg Subcutaneous Q24H  ? famotidine  20 mg Oral Daily  ? hydrALAZINE  10 mg Oral Q8H  ? irbesartan  300 mg Oral Daily  ? magnesium oxide  4

## 2021-05-31 DIAGNOSIS — M25552 Pain in left hip: Secondary | ICD-10-CM | POA: Diagnosis not present

## 2021-05-31 LAB — BASIC METABOLIC PANEL
Anion gap: 7 (ref 5–15)
BUN: 9 mg/dL (ref 8–23)
CO2: 28 mmol/L (ref 22–32)
Calcium: 9.5 mg/dL (ref 8.9–10.3)
Chloride: 105 mmol/L (ref 98–111)
Creatinine, Ser: 0.51 mg/dL (ref 0.44–1.00)
GFR, Estimated: >60 mL/min (ref >60)
Glucose, Bld: 99 mg/dL (ref 70–99)
Potassium: 3.8 mmol/L (ref 3.5–5.1)
Sodium: 140 mmol/L (ref 135–145)

## 2021-05-31 LAB — MAGNESIUM: Magnesium: 1.7 mg/dL (ref 1.7–2.4)

## 2021-05-31 MED ORDER — AMLODIPINE BESYLATE 10 MG PO TABS
10.0000 mg | ORAL_TABLET | Freq: Every day | ORAL | Status: DC
  Administered 2021-05-31 – 2021-06-02 (×3): 10 mg via ORAL
  Filled 2021-05-31 (×3): qty 1

## 2021-05-31 MED ORDER — HYDROMORPHONE HCL 1 MG/ML IJ SOLN: 0.5000 mg | INTRAMUSCULAR | Status: DC | PRN

## 2021-05-31 MED ORDER — GABAPENTIN 100 MG PO CAPS
200.0000 mg | ORAL_CAPSULE | Freq: Three times a day (TID) | ORAL | Status: DC
Start: 2021-05-31 — End: 2021-06-02
  Administered 2021-05-31 – 2021-06-02 (×6): 200 mg via ORAL
  Filled 2021-05-31 (×6): qty 2

## 2021-05-31 MED ORDER — HYDROMORPHONE HCL 2 MG PO TABS
1.0000 mg | ORAL_TABLET | ORAL | Status: DC | PRN
  Administered 2021-05-31: 1 mg via ORAL
  Filled 2021-05-31: qty 1

## 2021-05-31 MED ORDER — OXYCODONE-ACETAMINOPHEN 7.5-325 MG PO TABS
2.0000 | ORAL_TABLET | ORAL | Status: DC | PRN
  Administered 2021-05-31 – 2021-06-01 (×6): 2 via ORAL
  Filled 2021-05-31 (×6): qty 2

## 2021-05-31 MED ORDER — HYDRALAZINE HCL 25 MG PO TABS
25.0000 mg | ORAL_TABLET | Freq: Three times a day (TID) | ORAL | Status: DC
  Administered 2021-05-31 – 2021-06-02 (×5): 25 mg via ORAL
  Filled 2021-05-31 (×5): qty 1

## 2021-05-31 MED ORDER — NAPROXEN 250 MG PO TABS
375.0000 mg | ORAL_TABLET | Freq: Two times a day (BID) | ORAL | Status: DC
Start: 2021-05-31 — End: 2021-06-02
  Administered 2021-05-31 – 2021-06-02 (×4): 375 mg via ORAL
  Filled 2021-05-31 (×4): qty 2

## 2021-05-31 NOTE — Progress Notes (Signed)
?PROGRESS NOTE ? ? ?Sherry [name]  YPP:[id number] DOB: 05/07/1930 DOA: 05/28/2021 ?PCP: Rusty Aus, MD  ?Brief Narrative:  ? ?71 community dwell white female lives with her husband at home ?Known history HTN spinal stenosis back pain ?Recent admission 3/16 through 3/28 with duodenitis ?Also had severe hip pain felt to be sciatica and CT showed osteoarthritis with no fracture dislocation and patient was sent home on gabapentin and oxycodone ?We presented to ED 3/30 severely low back left hip pain with radiation hemoglobin 10 ?MRI lumbar spine = lumbosacral anatomy left subarticular disc extrusion L4-5 ?Because of inability to bear weight and walk patient was seen by neurosurgery Dr. Denton Lank appears that she was supposed to see neurosurgery service in February at [facility] and was supposed to follow-up with orthopedic spine surgeon Dr. Feliberto Gottron at [facility] for C2 fracture ? ? ?Hospital-Problem based course ? ?Intractable lumbar pain secondary to spinal stenosis L4-L5 versus disc protrusion on MRI noted on 3/30 ?Change tramadol to Percocet, oral Dilaudid for severe pain-for breakthrough pain IV Dilaudid ?I will add conservative Naprosyn for NSAID but will be careful given her age etc. ?Can use a home dose of gabapentin 200 3 times daily from last admission-again caution given beers criteria and additive effect superimposed on opiates ?Discussed with IR Dr. Sherald Barge will probably benefit from [facility] for L4 foraminal stenosis when this can be arranged-she is not on any blood thinner other than prophylactic Lovenox at this time ?Interestingly, patient was able to move her legs and ambulate to the bathroom earlier this morning according to nursing ?I have reviewed the CT from 3/26 on last admission and there is no hip fracture ?Duodenitis ?Increased Protonix to 40 twice daily, add Pepcid 20, will give Reglan in addition to Zofran ?Has never had specific esophageal dysmotility-was eating bites of food several days prior  to admission but not much by her own admission ?Discussed with her that we can liberalize her diet ?Blood pressure elevated secondary to pain ?Continue Avapro 300 daily ?Mild hypokalemia ?Replaced-resolved ?Continue magnesium replacement ? ?DVT prophylaxis: Lovenox ?Code Status: Full  ?Family Communication: called and left message with patient's son JamesJohnson on his cell phone on 3/31 ?Disposition:  ?Status is: Observation ?The patient will require care spanning > 2 midnights and should be moved to inpatient because:  ? ?Intractable pain ?  ?Consultants:  ?Neurosurgery ? ?Procedures: MRI ? ?Antimicrobials: None ? ? ?Subjective: ? ?Overall paroxysms of pain ?We discussed changing over from tramadol to Percocet as first choice and then p.o. 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 ?Objective: ?Vitals:  ? 05/30/21 2300 05/31/21 0224 05/31/21 0752 05/31/21 1108  ?BP: (!) 161/74 (!) 174/81  (!) 144/61  ?Pulse: 66 70  93  ?Resp: 17 17    ?Temp: 97.8 ?F (36.6 ?C) 97.8 ?F (36.6 ?C) 97.8 ?F (36.6 ?C) 98.4 ?F (36.9 ?C)  ?TempSrc: Oral Oral Oral Oral  ?SpO2: 96% 94%  98%  ? ? ?Intake/Output Summary (Last 24 hours) at 05/31/2021 1352 ?Last data filed at 05/31/2021 0200 ?Gross per 24 hour  ?Intake 360 ml  ?Output --  ?  Net 360 ml  ? ? ?There were no vitals filed for this visit. ? ?Examination: ? ?Frail white female-very hard of hearing ?CTA B no added sound ?Straight leg raise nowhere near as antalgic objectively as previously ?Abdomen is soft ?She has no epigastric tenderness ? ? ?Data Reviewed: personally reviewed  ? ?CBC ?    ?Component Value Date/Time  ? WBC 5.7 05/28/2021 1424  ? RBC 3.30 (L) 05/28/2021 1424  ? HGB 10.0 (L) 05/28/2021 1424  ? HCT 30.4 (L) 05/28/2021 1424  ? PLT 267 05/28/2021 1424  ? MCV 92.1 05/28/2021 1424  ? MCH 30.3 05/28/2021 1424  ? MCHC 32.9 05/28/2021 1424  ? RDW 14.4 05/28/2021 1424  ? LYMPHSABS 1.7 05/28/2021 1424  ? MONOABS 0.4 05/28/2021 1424  ? EOSABS 0.1 05/28/2021 1424  ? BASOSABS 0.0 05/28/2021 1424  ? ? ?  Latest Ref Rng & Units 05/31/2021  ?  6:55 AM 05/30/2021  ?  3:47 AM 05/28/2021  ?  2:24 PM  ?CMP  ?Glucose 70 - 99 mg/dL 99   104   97    ?BUN 8 - 23 mg/dL '9   6   8    '$ ?Creatinine 0.44 - 1.00 mg/dL 0.51   0.46   0.43    ?Sodium 135 - 145 mmol/L 140   137   140    ?Potassium 3.5 - 5.1 mmol/L 3.8   3.5   3.2    ?Chloride 98 - 111 mmol/L 105   103   106    ?CO2 22 - 32 mmol/L '28   26   28    '$ ?Calcium 8.9 - 10.3 mg/dL 9.5   9.3   9.0    ?Total Protein 6.5 - 8.1 g/dL  5.6     ?Total Bilirubin 0.3 - 1.2 mg/dL  0.5     ?Alkaline Phos 38 - 126 U/L  53     ?AST 15 - 41 U/L  18     ?ALT 0 - 44 U/L  22     ? ? ? ?Radiology Studies: ?No results found. ? ? ?Scheduled Meds: ? enoxaparin (LOVENOX) injection  40 mg Subcutaneous Q24H  ? famotidine  20 mg Oral Daily  ? hydrALAZINE  10 mg Oral Q8H  ? irbesartan  300 mg Oral Daily  ? magnesium oxide  400 mg Oral BID  ? naproxen  375 mg Oral BID WC  ? pantoprazole  40 mg Oral BID  ? ?Continuous Infusions: ? ? LOS: 2 days  ? ?Time spent: 28 ? ?Nita Sells, MD ?[facility] ?To contact the attending provider between 7A-7P or the covering provider during after hours 7P-7A, please log into the web site [email or web address] and access using universal [place] password for that web site. If you do not have the password, please call the hospital operator. ? ?05/31/2021, 1:52 PM  ? ? ?

## 2021-05-31 NOTE — Consult Note (Signed)
? ? ? ?Chief Complaint: ?Severe back pain ? ?Referring Physician(s): ?Samtani ? ?Supervising Physician: Arne Cleveland ? ?Patient Status: [facility] - In-pt ? ?History of Present Illness: ?Sherry [name] is a 88 y.o. female who presented to the ED on 3/30 c/o severe low back and left hip pain. ? ?MRI showed = ?lumbosacral anatomy left subarticular disc extrusion L4-5. ? ?We are asked to perform an epidural steroid injection. ? ?Dr. Vernard Gambles has reviewed her images. ? ?Past Medical History:  ?Diagnosis Date  ? Actinic keratosis   ? Hypertension   ? ? ?Past Surgical History:  ?Procedure Laterality Date  ? BACK SURGERY    ? IR INJECT/THERA/INC NEEDLE/CATH/PLC EPI/LUMB/SAC W/IMG  04/29/2021  ? MOHS SURGERY    ? ? ?Allergies: ?Morphine, Buprenorphine hcl, Codeine, Epinephrine, Morphine and related, and Percocet [oxycodone-acetaminophen] ? ?Medications: ?Prior to Admission medications   ?Medication Sig Start Date End Date Taking? Authorizing Provider  ?acetaminophen (TYLENOL) 500 MG tablet Take 1,000 mg by mouth every 6 (six) hours as needed for mild pain or fever.   Yes [provider]  ?amLODipine (NORVASC) 10 MG tablet Take 1 tablet (10 mg total) by mouth daily. 05/06/21  Yes Fritzi Mandes, MD  ?Biotin 5 MG TABS Take 5 mg by mouth daily.   Yes [provider]  ?cholecalciferol (VITAMIN D) 25 MCG (1000 UNIT) tablet Take 2,000 Units by mouth daily.   Yes [provider]  ?Cyanocobalamin (B-12) 1000 MCG CAPS Take 1,000 mcg by mouth daily.   Yes [provider]  ?diclofenac Sodium (VOLTAREN) 1 % GEL Apply 2 g topically every 6 (six) hours as needed (pain). 05/06/21  Yes Fritzi Mandes, MD  ?gabapentin (NEURONTIN) 100 MG capsule Take 2 capsules (200 mg total) by mouth 3 (three) times daily. 05/25/21  Yes Annita Brod, MD  ?hydrALAZINE (APRESOLINE) 10 MG tablet Take 1 tablet (10 mg total) by mouth every 8 (eight) hours. 05/26/21  Yes Annita Brod, MD  ?irbesartan (AVAPRO) 300 MG tablet Take 1  tablet (300 mg total) by mouth daily. 05/27/21  Yes Annita Brod, MD  ?Oxycodone HCl 10 MG TABS Take 0.5-1 tablets (5-10 mg total) by mouth every 8 (eight) hours as needed. ?Patient taking differently: Take 10 mg by mouth daily as needed. 05/25/21  Yes Annita Brod, MD  ?pantoprazole (PROTONIX) 40 MG tablet Take 40 mg by mouth daily. 05/11/21  Yes [provider]  ?  ? ?Family History  ?Problem Relation Age of Onset  ? Hypertension Father   ? ? ?Social History  ? ?Socioeconomic History  ? Marital status: Married  ?  Spouse name: Not on file  ? Number of children: Not on file  ? Years of education: Not on file  ? Highest education level: Not on file  ?Occupational History  ? Not on file  ?Tobacco Use  ? Smoking status: Never  ? Smokeless tobacco: Never  ?Substance and Sexual Activity  ? Alcohol use: Yes  ?  Comment: Occassionally  ? Drug use: No  ? Sexual activity: Not on file  ?Other Topics Concern  ? Not on file  ?Social History Narrative  ? Not on file  ? ?Social Determinants of Health  ? ?Financial Resource Strain: Not on file  ?Food Insecurity: Not on file  ?Transportation Needs: Not on file  ?Physical Activity: Not on file  ?Stress: Not on file  ?Social Connections: Not on file  ? ? ? ?Review of Systems: A 12 point ROS discussed  and pertinent positives are indicated in the HPI above.  All other systems are negative. ? ?Review of Systems ? ?Vital Signs: ?BP (!) 144/61 (BP Location: Right Arm)   Pulse 93   Temp 98.4 ?F (36.9 ?C) (Oral)   Resp 17   SpO2 98%  ? ?Physical Exam ?Vitals reviewed.  ?Constitutional:   ?   Appearance: Normal appearance.  ?Cardiovascular:  ?   Rate and Rhythm: Normal rate.  ?Pulmonary:  ?   Effort: Pulmonary effort is normal. No respiratory distress.  ?Musculoskeletal:  ?   Comments: Severe low back pain with difficulty even trying to sit up in bed for me to examine her.  ?Neurological:  ?   General: No focal deficit present.  ?   Mental Status: She is alert and  oriented to person, place, and time.  ?Psychiatric:     ?   Behavior: Behavior normal.     ?   Judgment: Judgment normal.  ? ? ?Imaging: ?DG Chest 2 View ? ?Result Date: 05/14/2021 ?CLINICAL DATA:  Chest pain EXAM: CHEST - 2 VIEW COMPARISON:  08/03/2017 FINDINGS: The heart size and mediastinal contours are within normal limits. Both lungs are clear. The visualized skeletal structures are unremarkable. IMPRESSION: No active cardiopulmonary disease. Electronically Signed   By: Rolm Baptise M.D.   On: 05/14/2021 20:11  ? ?MR Lumbar Spine Wo Contrast ? ?Result Date: 05/28/2021 ?CLINICAL DATA:  Lumbar radiculopathy.  Left hip and leg pain. EXAM: MRI LUMBAR SPINE WITHOUT CONTRAST TECHNIQUE: Multiplanar, multisequence MR imaging of the lumbar spine was performed. No intravenous contrast was administered. COMPARISON:  Lumbar spine MRI 04/26/2021. Chest radiographs 05/14/2021. CT abdomen and pelvis 05/17/2021. FINDINGS: Segmentation: There is transitional lumbosacral anatomy. Based on the comparison studies, there are 11 full sized pairs of ribs, small ribs at T12, 4 lumbar vertebrae, and a transitional segment which is considered a partially sacralized L5 with rudimentary L5-S1 disc. This numbering differs from that used on the prior MRI. Alignment: Unchanged slight retrolisthesis of T12 on L1, L1 on L2, and L2 on L3 and grade 1 anterolisthesis of L3 on L4 and L4 on L5. Mild lumbar levoscoliosis. Vertebrae: No fracture, suspicious marrow lesion, or significant marrow edema. Multilevel chronic degenerative endplate changes. Conus medullaris and cauda equina: Conus extends to the L1 level. Conus and cauda equina appear normal. Paraspinal and other soft tissues: Unremarkable. Disc levels: Disc desiccation throughout the lumbar and included lower thoracic spine with widespread disc space narrowing which is most severe at L3-4. T11-12: Only imaged sagittally. Mild disc bulging and moderate facet arthrosis without evidence of  significant stenosis. T12-L1: Only imaged sagittally. Mild disc bulging and moderate facet arthrosis without evidence of significant stenosis. L1-2: Circumferential disc bulging eccentric to the left and moderate facet and ligamentum flavum hypertrophy result in mild left greater than right lateral recess stenosis and mild right and moderate left neural foraminal stenosis without spinal stenosis, unchanged. L2-3: Circumferential disc bulging and severe facet and ligamentum flavum hypertrophy result in mild bilateral lateral recess stenosis and mild bilateral neural foraminal stenosis without spinal stenosis, unchanged. L3-4: Anterolisthesis with bulging uncovered disc, a right foraminal disc protrusion, and severe facet and ligamentum flavum hypertrophy result in mild spinal stenosis, mild-to-moderate bilateral lateral recess stenosis, and severe right and mild left neural foraminal stenosis, unchanged. L4-5: Anterolisthesis with bulging uncovered disc, a small left subarticular disc extrusion with slight cranial migration, and severe facet and ligamentum flavum hypertrophy result in moderate spinal stenosis, moderate to severe bilateral  lateral recess stenosis, and mild-to-moderate right and moderate to severe left neural foraminal stenosis. The disc extrusion has mildly enlarged and may result in left L4 nerve root impingement. The L5 nerve roots may be affected in the lateral recesses bilaterally. L5-S1: Transitional anatomy with rudimentary disc.  No stenosis. IMPRESSION: 1. Transitional lumbosacral anatomy as above. Note that today's numbering is different than that used on the prior MRI. 2. Mild enlargement of a left subarticular disc extrusion at L4-5 with potential left L4 nerve root impingement. Moderate to severe bilateral lateral recess and left neural foraminal stenosis at this level. 3. Unchanged disc and facet degeneration elsewhere as above. Electronically Signed   By: Logan Bores M.D.   On:  05/28/2021 17:00  ? ?CT ABDOMEN PELVIS W CONTRAST ? ?Result Date: 05/17/2021 ?CLINICAL DATA:  Worsening abdominal pain. Known contained duodenal perforation. EXAM: CT ABDOMEN AND PELVIS WITH CONTRAST TECHNIQUE: Multidetect

## 2021-06-01 ENCOUNTER — Other Ambulatory Visit (HOSPITAL_COMMUNITY): Payer: Self-pay

## 2021-06-01 ENCOUNTER — Inpatient Hospital Stay (HOSPITAL_COMMUNITY): Payer: Medicare Other

## 2021-06-01 DIAGNOSIS — M25552 Pain in left hip: Secondary | ICD-10-CM | POA: Diagnosis not present

## 2021-06-01 MED ORDER — GABAPENTIN 100 MG PO CAPS
200.0000 mg | ORAL_CAPSULE | Freq: Three times a day (TID) | ORAL | 1 refills | Status: DC
  Filled 2021-06-01: qty 180, 30d supply, fill #0

## 2021-06-01 MED ORDER — PANTOPRAZOLE SODIUM 40 MG PO TBEC
40.0000 mg | DELAYED_RELEASE_TABLET | Freq: Two times a day (BID) | ORAL | 0 refills | Status: DC
  Filled 2021-06-01: qty 60, 30d supply, fill #0

## 2021-06-01 MED ORDER — OXYCODONE HCL 10 MG PO TABS
5.0000 mg | ORAL_TABLET | Freq: Three times a day (TID) | ORAL | 0 refills | Status: DC | PRN
  Filled 2021-06-01: qty 15, 5d supply, fill #0

## 2021-06-01 MED ORDER — NAPROXEN 375 MG PO TABS
375.0000 mg | ORAL_TABLET | Freq: Two times a day (BID) | ORAL | 0 refills | Status: DC
  Filled 2021-06-01: qty 30, 15d supply, fill #0

## 2021-06-01 MED ORDER — FAMOTIDINE 20 MG PO TABS
20.0000 mg | ORAL_TABLET | Freq: Every day | ORAL | 0 refills | Status: DC
  Filled 2021-06-01: qty 30, 30d supply, fill #0

## 2021-06-01 NOTE — Progress Notes (Signed)
?PROGRESS NOTE ? ? ?Sherry [name]  TMH:[id number] DOB: 1930/04/01 DOA: 05/28/2021 ?PCP: Rusty Aus, MD  ?Brief Narrative:  ? ?79 community dwell white female lives with her husband at home ?Known history HTN spinal stenosis back pain ?Recent admission 3/16 through 3/28 with duodenitis ?Also had severe hip pain felt to be sciatica and CT showed osteoarthritis with no fracture dislocation and patient was sent home on gabapentin and oxycodone ?We presented to ED 3/30 severely low back left hip pain with radiation hemoglobin 10 ?MRI lumbar spine = lumbosacral anatomy left subarticular disc extrusion L4-5 ?Because of inability to bear weight and walk patient was seen by neurosurgery Dr. Denton Lank appears that she was supposed to see neurosurgery service in February at [facility] and was supposed to follow-up with orthopedic spine surgeon Dr. Feliberto Gottron at [facility] for C2 fracture ? ? ?Hospital-Problem based course ? ?Intractable lumbar pain secondary to spinal stenosis L4-L5 versus disc protrusion on MRI noted on 3/30 ?Currently on Percocet 15 q4 prn, Dilaudid 1 q 4 prn---for breakthru  0.5--1 q 4 prn- ?for today patient has only used Percocet and gabapentin and was able to walk the length of the hallways-I truly think that patient was not taking her gabapentin prescribed to her on the last admission and this has made the biggest difference-we have canceled epidural steroid injection for L4 foraminal stenosis ?Continue Naprosyn for NSAID but will be careful given her age etc. ?I have reviewed the CT from 3/26 on last admission and there is no hip fracture ?Duodenitis ?Increased Protonix to 40 twice daily, add Pepcid 20, stop Reglan continue Zofran discussed with the patient's nephew at the bedside ?Discussed with her that we can liberalize her diet ?Blood pressure elevated secondary to pain ?Continue Avapro 300 daily ?Mild hypokalemia ?Replaced-resolved ?Continue magnesium replacement ? ?DVT prophylaxis: Lovenox ?Code Status:  Full  ?Family Communication: Discussed with the patient's nephew at the bedside Dr. Edilia Bo who is a palliative care physician with [facility] ?Disposition: bedside ?Status is: Observation ?The patient will require care spanning > 2 midnights and should be moved to inpatient because:  ? ?Improving pain-May be able to discharge tomorrow if the pain is tolerable still and able to ambulate ?  ?Consultants:  ?Neurosurgery ? ?Procedures: MRI ? ?Antimicrobials: None ? ? ?Subjective: ? ?Much improved-walking-eating and drinking-feels much better than she did over the past several days ?No chest pain ?She is taken several walks today ? ?Objective: ?Vitals:  ? 06/01/21 0315 06/01/21 0400 06/01/21 9211 06/01/21 0723  ?BP: (!) 156/75 (!) 156/75 (!) 167/78 (!) 151/66  ?Pulse: 77 77    ?Resp:  18    ?Temp:  98.2 ?F (36.8 ?C)  98.1 ?F (36.7 ?C)  ?TempSrc:  Oral  Oral  ?SpO2: 98% 96%    ? ?No intake or output data in the 24 hours ending 06/01/21 1110 ? ?There were no vitals filed for this visit. ? ?Examination: ? ?Frail white female-very hard of hearing ?CTA B no added sound  ?Straight leg raise is much improved ?Abdomen is soft ?She has no epigastric tenderness ? ? ?Data Reviewed: personally reviewed  ? ?CBC ?   ?Component Value Date/Time  ? WBC 5.7 05/28/2021 1424  ? RBC 3.30 (L) 05/28/2021 1424  ? HGB 10.0 (L) 05/28/2021 1424  ? HCT 30.4 (L) 05/28/2021 1424  ? PLT 267 05/28/2021 1424  ? MCV 92.1 05/28/2021 1424  ? MCH 30.3 05/28/2021 1424  ? MCHC 32.9 05/28/2021 1424  ? RDW 14.4 05/28/2021 1424  ?  LYMPHSABS 1.7 05/28/2021 1424  ? MONOABS 0.4 05/28/2021 1424  ? EOSABS 0.1 05/28/2021 1424  ? BASOSABS 0.0 05/28/2021 1424  ? ? ?  Latest Ref Rng & Units 05/31/2021  ?  6:55 AM 05/30/2021  ?  3:47 AM 05/28/2021  ?  2:24 PM  ?CMP  ?Glucose 70 - 99 mg/dL 99   104   97    ?BUN 8 - 23 mg/dL '9   6   8    '$ ?Creatinine 0.44 - 1.00 mg/dL 0.51   0.46   0.43    ?Sodium 135 - 145 mmol/L 140   137   140    ?Potassium 3.5 - 5.1 mmol/L 3.8   3.5   3.2     ?Chloride 98 - 111 mmol/L 105   103   106    ?CO2 22 - 32 mmol/L '28   26   28    '$ ?Calcium 8.9 - 10.3 mg/dL 9.5   9.3   9.0    ?Total Protein 6.5 - 8.1 g/dL  5.6     ?Total Bilirubin 0.3 - 1.2 mg/dL  0.5     ?Alkaline Phos 38 - 126 U/L  53     ?AST 15 - 41 U/L  18     ?ALT 0 - 44 U/L  22     ? ? ? ?Radiology Studies: ?No results found. ? ? ?Scheduled Meds: ? amLODipine  10 mg Oral Daily  ? enoxaparin (LOVENOX) injection  40 mg Subcutaneous Q24H  ? famotidine  20 mg Oral Daily  ? gabapentin  200 mg Oral TID  ? hydrALAZINE  25 mg Oral Q8H  ? irbesartan  300 mg Oral Daily  ? magnesium oxide  400 mg Oral BID  ? naproxen  375 mg Oral BID WC  ? pantoprazole  40 mg Oral BID  ? ?Continuous Infusions: ? ? LOS: 3 days  ? ?Time spent: 56 ? ?Nita Sells, MD ?[facility] ?To contact the attending provider between 7A-7P or the covering provider during after hours 7P-7A, please log into the web site [email or web address] and access using universal Bertha password for that web site. If you do not have the password, please call the hospital operator. ? ?06/01/2021, 11:10 AM  ? ? ?

## 2021-06-01 NOTE — Progress Notes (Signed)
Physical Therapy Treatment ?Patient Details ?Name: Sherry [name] ?MRN: [id number] ?DOB: 1930-11-05 ?Today's Date: 06/01/2021 ? ? ?History of Present Illness 88 y.o. female presents to [facility] hospital on 05/28/2021 with left hip and back pain. Pt with recent admissions for duodenal perforation, discharged 3/28. PMH of HTN. ? ?  ?PT Comments  ? ? Pt admitted with above diagnosis. Pt was able to ambulate with RW with good safety overall. Pt without LOB.  Pt limited by pain as she continues to have left foot and LE numbness and back pain.   Pt currently with functional limitations due to balance and endurance deficits. Pt will benefit from skilled PT to increase their independence and safety with mobility to allow discharge to the venue listed below.      ?Recommendations for follow up therapy are one component of a multi-disciplinary discharge planning process, led by the attending physician.  Recommendations may be updated based on patient status, additional functional criteria and insurance authorization. ? ?Follow Up Recommendations ? Home health PT ?  ?  ?Assistance Recommended at Discharge Intermittent Supervision/Assistance  ?Patient can return home with the following Help with stairs or ramp for entrance;Assist for transportation;A little help with bathing/dressing/bathroom ?  ?Equipment Recommendations ? BSC/3in1  ?  ?Recommendations for Other Services   ? ? ?  ?Precautions / Restrictions Precautions ?Precautions: Fall ?Restrictions ?Weight Bearing Restrictions: No  ?  ? ?Mobility ? Bed Mobility ?  ?  ?  ?  ?  ?  ?  ?General bed mobility comments: in chair on arrival ?  ? ?Transfers ?Overall transfer level: Needs assistance ?Equipment used: Rolling walker (2 wheels) ?Transfers: Sit to/from Stand ?Sit to Stand: Supervision ?  ?  ?  ?  ?  ?General transfer comment: mild increased effort to stand from chair on own but steady and safe with RW use ?  ? ?Ambulation/Gait ?Ambulation/Gait assistance: Min guard ?Gait  Distance (Feet): 90 Feet ?Assistive device: Rolling walker (2 wheels) ?Gait Pattern/deviations: Step-to pattern, Decreased step length - left, Decreased stance time - left, Antalgic ?Gait velocity: reduced ?Gait velocity interpretation: <1.8 ft/sec, indicate of risk for recurrent falls ?  ?General Gait Details: pt with slowed step-to gait, antalgic gait on left side with reduced stance time with pt stating that the left foot and leg are numb. Pt is safe with the RW. No LOB. ? ? ?Stairs ?  ?  ?  ?  ?  ? ? ?Wheelchair Mobility ?  ? ?Modified Rankin (Stroke Patients Only) ?  ? ? ?  ?Balance Overall balance assessment: Needs assistance ?Sitting-balance support: No upper extremity supported, Feet supported ?Sitting balance-Leahy Scale: Fair ?Sitting balance - Comments: steady sitting reaching outside BOS ?  ?Standing balance support: Bilateral upper extremity supported, Reliant on assistive device for balance, During functional activity ?Standing balance-Leahy Scale: Poor ?Standing balance comment: steady standing reaching within BOS, relies on device for balance ?  ?  ?  ?  ?  ?  ?  ?  ?  ?  ?  ?  ? ?  ?Cognition Arousal/Alertness: Awake/alert ?Behavior During Therapy: [facility] for tasks assessed/performed ?Overall Cognitive Status: Within Functional Limits for tasks assessed ?  ?  ?  ?  ?  ?  ?  ?  ?  ?  ?  ?  ?  ?  ?  ?  ?General Comments: A&Ox3- self, location, situation ?  ?  ? ?  ?Exercises General Exercises - Lower Extremity ?Ankle Circles/Pumps: AROM, Both,  10 reps ?Heel Slides: AROM, Both, 10 reps ?Hip ABduction/ADduction: AROM, Both, 10 reps ? ?  ?General Comments General comments (skin integrity, edema, etc.): VSS on RA ?  ?  ? ?Pertinent Vitals/Pain Pain Assessment ?Pain Assessment: Faces ?Faces Pain Scale: Hurts whole lot ?Pain Location: back and LLE ?Pain Descriptors / Indicators: Hervey Ard, Shooting ?Pain Intervention(s): Limited activity within patient's tolerance, Monitored during session, Premedicated before  session, Repositioned  ? ? ?Home Living   ?  ?  ?  ?  ?  ?  ?  ?  ?  ?   ?  ?Prior Function    ?  ?  ?   ? ?PT Goals (current goals can now be found in the care plan section) Acute Rehab PT Goals ?Patient Stated Goal: to reduce pain ?Progress towards PT goals: Progressing toward goals ? ?  ?Frequency ? ? ? Min 3X/week ? ? ? ?  ?PT Plan Current plan remains appropriate  ? ? ?Co-evaluation   ?  ?  ?  ?  ? ?  ?AM-PAC PT "6 Clicks" Mobility   ?Outcome Measure ? Help needed turning from your back to your side while in a flat bed without using bedrails?: A Little ?Help needed moving from lying on your back to sitting on the side of a flat bed without using bedrails?: A Little ?Help needed moving to and from a bed to a chair (including a wheelchair)?: A Little ?Help needed standing up from a chair using your arms (e.g., wheelchair or bedside chair)?: A Little ?Help needed to walk in hospital room?: A Little ?Help needed climbing 3-5 steps with a railing? : Total ?6 Click Score: 16 ? ?  ?End of Session Equipment Utilized During Treatment: Gait belt ?Activity Tolerance: Patient limited by pain;Patient limited by fatigue ?Patient left: in bed;with call bell/phone within reach;in chair;with chair alarm set ?Nurse Communication: Mobility status ?PT Visit Diagnosis: Pain;Muscle weakness (generalized) (M62.81) ?Pain - Right/Left: Left ?Pain - part of body: Leg (low back radiating into LLE) ?  ? ? ?Time: 0947-0962 ?PT Time Calculation (min) (ACUTE ONLY): 25 min ? ?Charges:  $Gait Training: 8-22 mins ?$Therapeutic Exercise: 8-22 mins          ?          ? ?Ayaansh Smail M,PT ?Acute Rehab Services ?[phone] ?[phone] (pager)  ? ? ?[name] ?06/01/2021, 11:02 AM ? ?

## 2021-06-01 NOTE — Care Management Important Message (Signed)
Important Message ? ?Patient Details  ?Name: Sherry [name] ?MRN: [id number] ?Date of Birth: 1930-06-11 ? ? ?Medicare Important Message Given:  Yes ? ? ? ? ?Levada Dy  Mohanad Carsten-Martin ?06/01/2021, 1:18 PM ?

## 2021-06-01 NOTE — Progress Notes (Signed)
OT Cancellation Note ? ?Patient Details ?Name: Sherry [name] ?MRN: [id number] ?DOB: 1930/09/19 ? ? ?Cancelled Treatment:    Reason Eval/Treat Not Completed: Patient declined, no reason specified (pt declining upon arrival due to "walking a lot" today, and having visitors present. OT treatment to f/u as appropiate.) ? ?Katrina Brosh A Shayleigh Bouldin ?06/01/2021, 4:17 PM ?

## 2021-06-02 DIAGNOSIS — M25552 Pain in left hip: Secondary | ICD-10-CM | POA: Diagnosis not present

## 2021-06-02 LAB — COMPREHENSIVE METABOLIC PANEL
ALT: 15 U/L (ref 0–44)
AST: 14 U/L — ABNORMAL LOW (ref 15–41)
Albumin: 3 g/dL — ABNORMAL LOW (ref 3.5–5.0)
Alkaline Phosphatase: 40 U/L (ref 38–126)
Anion gap: 8 (ref 5–15)
BUN: 18 mg/dL (ref 8–23)
CO2: 26 mmol/L (ref 22–32)
Calcium: 9.2 mg/dL (ref 8.9–10.3)
Chloride: 104 mmol/L (ref 98–111)
Creatinine, Ser: 0.78 mg/dL (ref 0.44–1.00)
GFR, Estimated: >60 mL/min (ref >60)
Glucose, Bld: 109 mg/dL — ABNORMAL HIGH (ref 70–99)
Potassium: 4.2 mmol/L (ref 3.5–5.1)
Sodium: 138 mmol/L (ref 135–145)
Total Bilirubin: 0.3 mg/dL (ref 0.3–1.2)
Total Protein: 5.3 g/dL — ABNORMAL LOW (ref 6.5–8.1)

## 2021-06-02 LAB — CBC WITH DIFFERENTIAL/PLATELET
Abs Immature Granulocytes: 0.03 10*3/uL (ref 0.00–0.07)
Basophils Absolute: 0 10*3/uL (ref 0.0–0.1)
Basophils Relative: 1 %
Eosinophils Absolute: 0.1 10*3/uL (ref 0.0–0.5)
Eosinophils Relative: 2 %
HCT: 29.1 % — ABNORMAL LOW (ref 36.0–46.0)
Hemoglobin: 9.3 g/dL — ABNORMAL LOW (ref 12.0–15.0)
Immature Granulocytes: 1 %
Lymphocytes Relative: 28 %
Lymphs Abs: 1.7 10*3/uL (ref 0.7–4.0)
MCH: 29.8 pg (ref 26.0–34.0)
MCHC: 32 g/dL (ref 30.0–36.0)
MCV: 93.3 fL (ref 80.0–100.0)
Monocytes Absolute: 0.6 10*3/uL (ref 0.1–1.0)
Monocytes Relative: 9 %
Neutro Abs: 3.6 10*3/uL (ref 1.7–7.7)
Neutrophils Relative %: 59 %
Platelets: 285 10*3/uL (ref 150–400)
RBC: 3.12 MIL/uL — ABNORMAL LOW (ref 3.87–5.11)
RDW: 15.1 % (ref 11.5–15.5)
WBC: 6 10*3/uL (ref 4.0–10.5)
nRBC: 0 % (ref 0.0–0.2)

## 2021-06-02 NOTE — Progress Notes (Signed)
Occupational Therapy Treatment ?Patient Details ?Name: Sherry [name] ?MRN: [id number] ?DOB: [birth year] ?Today's Date: 06/02/2021 ? ? ?History of present illness 88 y.o. female presents to [facility] hospital on 05/28/2021 with left hip and back pain. Pt with recent admissions for duodenal perforation, discharged 3/28. PMH of HTN. ?  ?OT comments ? Patient making better progress on this date due to decreased complaints of pain. Patient performed toilet transfer and hygiene, grooming at sink, and dressing tasks with supervision.  Functional mobility performed with patient stating fatigue at end of session. Patient is expected to discharge home today with [place].   ? ?Recommendations for follow up therapy are one component of a multi-disciplinary discharge planning process, led by the attending physician.  Recommendations may be updated based on patient status, additional functional criteria and insurance authorization. ?   ?Follow Up Recommendations ? Home health OT  ?  ?Assistance Recommended at Discharge Intermittent Supervision/Assistance  ?Patient can return home with the following ? A little help with walking and/or transfers;A little help with bathing/dressing/bathroom;Assistance with cooking/housework ?  ?Equipment Recommendations ? None recommended by OT  ?  ?Recommendations for Other Services   ? ?  ?Precautions / Restrictions Precautions ?Precautions: Fall ?Restrictions ?Weight Bearing Restrictions: No  ? ? ?  ? ?Mobility Bed Mobility ?Overal bed mobility: Needs Assistance ?Bed Mobility: Sidelying to Sit ?  ?Sidelying to sit: Supervision ?  ?  ?  ?General bed mobility comments: increased time to perform ?  ? ?Transfers ?Overall transfer level: Needs assistance ?Equipment used: Rolling walker (2 wheels) ?Transfers: Sit to/from Stand, Bed to chair/wheelchair/BSC ?Sit to Stand: Supervision ?  ?  ?  ?  ?  ?General transfer comment: suprvision to perform toilet transfer and transfer to recliner ?  ?  ?Balance Overall  balance assessment: Needs assistance ?Sitting-balance support: No upper extremity supported, Feet supported ?Sitting balance-Leahy Scale: Fair ?Sitting balance - Comments: able to sit on EOB to perform dressing tasks ?  ?Standing balance support: Single extremity supported, Bilateral upper extremity supported, During functional activity ?Standing balance-Leahy Scale: Poor ?Standing balance comment: able to stand at sink with sink for assist with balance ?  ?  ?  ?  ?  ?  ?  ?  ?  ?  ?  ?   ? ?ADL either performed or assessed with clinical judgement  ? ?ADL Overall ADL's : Needs assistance/impaired ?  ?  ?Grooming: Wash/dry hands;Wash/dry face;Oral care;Supervision/safety;Standing ?Grooming Details (indicate cue type and reason): at sink ?  ?  ?  ?  ?Upper Body Dressing : Supervision/safety;Standing ?Upper Body Dressing Details (indicate cue type and reason): donned gown over head and housecoat ?  ?  ?Toilet Transfer: Supervision/safety;Regular Toilet;Rolling walker (2 wheels) ?Toilet Transfer Details (indicate cue type and reason): verbal cues for safety ?Toileting- Clothing Manipulation and Hygiene: Modified independent ?Toileting - Clothing Manipulation Details (indicate cue type and reason): performed seated ?  ?  ?  ?General ADL Comments: supervision for safety with all tasks ?  ? ?Extremity/Trunk Assessment   ?  ?  ?  ?  ?  ? ?Vision   ?  ?  ?Perception   ?  ?Praxis   ?  ? ?Cognition Arousal/Alertness: Awake/alert ?Behavior During Therapy: [facility] for tasks assessed/performed ?Overall Cognitive Status: Within Functional Limits for tasks assessed ?  ?  ?  ?  ?  ?  ?  ?  ?  ?  ?  ?  ?  ?  ?  ?  ?  General Comments: A&Ox3- self, location, situation ?  ?  ?   ?Exercises   ? ?  ?Shoulder Instructions   ? ? ?  ?General Comments    ? ? ?Pertinent Vitals/ Pain       Pain Assessment ?Pain Assessment: Faces ?Pain Score: 6  ?Pain Location: back and LLE ?Pain Descriptors / Indicators: Hervey Ard, Shooting ?Pain Intervention(s):  Limited activity within patient's tolerance, Monitored during session, Repositioned ? ?Home Living   ?  ?  ?  ?  ?  ?  ?  ?  ?  ?  ?  ?  ?  ?  ?  ?  ?  ?  ? ?  ?Prior Functioning/Environment    ?  ?  ?  ?   ? ?Frequency ? Min 2X/week  ? ? ? ? ?  ?Progress Toward Goals ? ?OT Goals(current goals can now be found in the care plan section) ? Progress towards OT goals: Progressing toward goals ? ?Acute Rehab OT Goals ?Patient Stated Goal: go home ?OT Goal Formulation: With patient ?Time For Goal Achievement: 06/12/21 ?Potential to Achieve Goals: Good ?ADL Goals ?Pt Will Perform Grooming: with set-up;standing ?Pt Will Perform Upper Body Dressing: with set-up;sitting ?Pt Will Perform Lower Body Dressing: with min assist;sit to/from stand;with adaptive equipment ?Pt Will Transfer to Toilet: with supervision;ambulating;regular height toilet  ?Plan Discharge plan remains appropriate   ? ?Co-evaluation ? ? ?   ?  ?  ?  ?  ? ?  ?AM-PAC OT "6 Clicks" Daily Activity     ?Outcome Measure ? ? Help from another person eating meals?: None ?Help from another person taking care of personal grooming?: A Little ?Help from another person toileting, which includes using toliet, bedpan, or urinal?: A Little ?Help from another person bathing (including washing, rinsing, drying)?: A Little ?Help from another person to put on and taking off regular upper body clothing?: A Little ?Help from another person to put on and taking off regular lower body clothing?: A Lot ?6 Click Score: 18 ? ?  ?End of Session Equipment Utilized During Treatment: Rolling walker (2 wheels) ? ?OT Visit Diagnosis: Unsteadiness on feet (R26.81);Muscle weakness (generalized) (M62.81);Pain ?  ?Activity Tolerance Patient tolerated treatment well ?  ?Patient Left in chair;with call bell/phone within reach;with chair alarm set ?  ?Nurse Communication Mobility status ?  ? ?   ? ?Time: 2751-7001 ?OT Time Calculation (min): 16 min ? ?Charges: OT General Charges ?$OT Visit: 1  Visit ?OT Treatments ?$Self Care/Home Management : 8-22 mins ? ?Lodema Hong, OTA ?Acute Rehabilitation Services  ?Pager [phone] ?Office [phone] ? ? ?[place] ?06/02/2021, 11:48 AM ?

## 2021-06-02 NOTE — Progress Notes (Signed)
Mobility Specialist: Progress Note ? ? 06/02/21 1223  ?Mobility  ?Activity Ambulated with assistance in hallway  ?Level of Assistance Contact guard assist, steadying assist  ?Distance Ambulated (ft) 240 ft  ?Activity Response Tolerated well  ?$Mobility charge 1 Mobility  ? ?Received pt in bed having no complaints and agreeable to mobility. Asymptomatic throughout ambulation, returned back to bed w/ call bell in reach and all needs met. ? ?Sherry [name] ?Mobility Specialist ?Mobility Specialist [place]: [phone] ?Mobility Specialist [place]: [phone] ? ?

## 2021-06-02 NOTE — Discharge Summary (Signed)
Physician Discharge Summary  ?Sherry [name]:[id number] DOB: 11-19-1930 DOA: 05/28/2021 ? ?PCP: Rusty Aus, MD ? ?Admit date: 05/28/2021 ?Discharge date: 06/02/2021 ? ?Time spent: 36 minutes ? ?Recommendations for Outpatient Follow-up:  ?Needs Chem-12 CBC 1 week ?Follow-up with [facility] orthopedics as below ? ?Discharge Diagnoses:  ?MAIN problem for hospitalization  ? ?Intractable pain secondary to not taking gabapentin and pain meds-TOC will get all the meds to the bedside prior to discharge ? ?Please see below for itemized issues addressed in HOpsital- ?refer to other progress notes for clarity if needed ? ?Discharge Condition: Improved ? ?Diet recommendation: Heart healthy ? ?There were no vitals filed for this visit. ? ?History of present illness:  ?54 [place] white female lives with her husband at home ?Known history HTN spinal stenosis back pain ?Recent admission 3/16 through 3/28 with duodenitis ?Also had severe hip pain felt to be sciatica and CT showed osteoarthritis with no fracture dislocation and patient was sent home on gabapentin and oxycodone ?We presented to ED 3/30 severely low back left hip pain with radiation hemoglobin 10 ?MRI lumbar spine = lumbosacral anatomy left subarticular disc extrusion L4-5 ?Because of inability to bear weight and walk patient was seen by neurosurgery Dr. Denton Lank appears that she was supposed to see neurosurgery service in February at [facility] and was supposed to follow-up with orthopedic spine surgeon Dr. Feliberto Gottron at [facility] for C2 fracture ?  ? ?Hospital Course:  ?Intractable lumbar pain secondary to spinal stenosis L4-L5 versus disc protrusion on MRI noted on 3/30 ?On discharge will use Tylenol Naprosyn and gabapentin as per [facility] ?We did contemplate doing an epidural steroid injection but after reinitiation of gabapentin Naprosyn her pain was close to 0 and she was walking about 80 to 90 feet with therapy which is close to her baseline ?I do think that she did not get  her medications refilled which is why we are calling in her meds to the [facility] pharmacy to ensure that she can get them ?We will give limited supply of opiates on discharge in addition but she should ideally follow-up with orthopedic surgery at [facility] who she has had surgery with and who she trusts ?Duodenitis ?Increased Protonix to 40 twice daily, add Pepcid 20, stop Reglan  ?continue Zofran  ?Blood pressure elevated secondary to pain ?Continue Avapro 300 daily ?Mild hypokalemia ?Replaced-resolved ?Continue magnesium replacement ? ?Procedures: ?  ?Consultations: ?Neurosurgery consulted on admission ?IR consulted but did not require procedure ? ?Discharge Exam: ?Vitals:  ? 06/02/21 0619 06/02/21 0740  ?BP: (!) 126/95 (!) 146/69  ?Pulse:  80  ?Resp:  14  ?Temp:  97.9 ?F (36.6 ?C)  ?SpO2:    ? ? ?Subj on day of d/c ?  ?Has some burning with urination no fever no chills and I have convinced her that without a fever or pressure this is unlikely to be a UTI ?Otherwise pain is good and she does not seem to have issues and slept really well ? ?General Exam on discharge ? ?EOMI NCAT no focal deficit CTA B no added sound no rales no rhonchi ?Abdomen soft no rebound no guarding ?ROM intact ?No lower extremity edema ? ?Discharge Instructions ? ? ? ?Allergies as of 06/02/2021   ? ?   Reactions  ? Morphine   ? Other reaction(s): Other (See Comments) ?Apnea with large dose ?Respiratory Distress  ? Buprenorphine Hcl   ? Other reaction(s): Other (See Comments), Other (See Comments) ?Respiratory Distress ?Respiratory Distress  ? Codeine   ?  Epinephrine   ? Other reaction(s): Other (See Comments) ?Severe anxiety; "makes my heart race" - would like to avoid during surgery  ? Morphine And Related Other (See Comments)  ? Respiratory Distress  ? Percocet [oxycodone-acetaminophen]   ? ?  ? ?  ?Medication List  ?  ? ?STOP taking these medications   ? ?diclofenac Sodium 1 % Gel ?Commonly known as: VOLTAREN ?  ? ?  ? ?TAKE these medications    ? ?acetaminophen 500 MG tablet ?Commonly known as: TYLENOL ?Take 1,000 mg by mouth every 6 (six) hours as needed for mild pain or fever. ?  ?amLODipine 10 MG tablet ?Commonly known as: NORVASC ?Take 1 tablet (10 mg total) by mouth daily. ?  ?B-12 1000 MCG Caps ?Take 1,000 mcg by mouth daily. ?  ?Biotin 5 MG Tabs ?Take 5 mg by mouth daily. ?  ?cholecalciferol 25 MCG (1000 UNIT) tablet ?Commonly known as: VITAMIN D ?Take 2,000 Units by mouth daily. ?  ?famotidine 20 MG tablet ?Commonly known as: PEPCID ?Take 1 tablet (20 mg total) by mouth daily. ?  ?gabapentin 100 MG capsule ?Commonly known as: NEURONTIN ?Take 2 capsules (200 mg total) by mouth 3 (three) times daily. ?  ?hydrALAZINE 10 MG tablet ?Commonly known as: APRESOLINE ?Take 1 tablet (10 mg total) by mouth every 8 (eight) hours. ?  ?irbesartan 300 MG tablet ?Commonly known as: AVAPRO ?Take 1 tablet (300 mg total) by mouth daily. ?  ?naproxen 375 MG tablet ?Commonly known as: NAPROSYN ?Take 1 tablet (375 mg total) by mouth 2 (two) times daily with a meal. ?  ?Oxycodone HCl 10 MG Tabs ?Take 0.5-1 tablets (5-10 mg total) by mouth every 8 (eight) hours as needed. ?What changed:  ?how much to take ?when to take this ?  ?pantoprazole 40 MG tablet ?Commonly known as: PROTONIX ?Take 1 tablet (40 mg total) by mouth 2 (two) times daily. ?What changed: when to take this ?  ? ?  ? ?Allergies  ?Allergen Reactions  ? Morphine   ?  Other reaction(s): Other (See Comments) ?Apnea with large dose ?Respiratory Distress ?  ? Buprenorphine Hcl   ?  Other reaction(s): Other (See Comments), Other (See Comments) ?Respiratory Distress ?Respiratory Distress ?  ? Codeine   ? Epinephrine   ?  Other reaction(s): Other (See Comments) ?Severe anxiety; "makes my heart race" - would like to avoid during surgery  ? Morphine And Related Other (See Comments)  ?  Respiratory Distress ?  ? Percocet [Oxycodone-Acetaminophen]   ? ? ? ? ?The results of significant diagnostics from this  hospitalization (including imaging, microbiology, ancillary and laboratory) are listed below for reference.   ? ?Significant Diagnostic Studies: ?DG Chest 2 View ? ?Result Date: 05/14/2021 ?CLINICAL DATA:  Chest pain EXAM: CHEST - 2 VIEW COMPARISON:  08/03/2017 FINDINGS: The heart size and mediastinal contours are within normal limits. Both lungs are clear. The visualized skeletal structures are unremarkable. IMPRESSION: No active cardiopulmonary disease. Electronically Signed   By: Rolm Baptise M.D.   On: 05/14/2021 20:11  ? ?MR Lumbar Spine Wo Contrast ? ?Result Date: 05/28/2021 ?CLINICAL DATA:  Lumbar radiculopathy.  Left hip and leg pain. EXAM: MRI LUMBAR SPINE WITHOUT CONTRAST TECHNIQUE: Multiplanar, multisequence MR imaging of the lumbar spine was performed. No intravenous contrast was administered. COMPARISON:  Lumbar spine MRI 04/26/2021. Chest radiographs 05/14/2021. CT abdomen and pelvis 05/17/2021. FINDINGS: Segmentation: There is transitional lumbosacral anatomy. Based on the comparison studies, there are 11 full sized pairs  of ribs, small ribs at T12, 4 lumbar vertebrae, and a transitional segment which is considered a partially sacralized L5 with rudimentary L5-S1 disc. This numbering differs from that used on the prior MRI. Alignment: Unchanged slight retrolisthesis of T12 on L1, L1 on L2, and L2 on L3 and grade 1 anterolisthesis of L3 on L4 and L4 on L5. Mild lumbar levoscoliosis. Vertebrae: No fracture, suspicious marrow lesion, or significant marrow edema. Multilevel chronic degenerative endplate changes. Conus medullaris and cauda equina: Conus extends to the L1 level. Conus and cauda equina appear normal. Paraspinal and other soft tissues: Unremarkable. Disc levels: Disc desiccation throughout the lumbar and included lower thoracic spine with widespread disc space narrowing which is most severe at L3-4. T11-12: Only imaged sagittally. Mild disc bulging and moderate facet arthrosis without evidence  of significant stenosis. T12-L1: Only imaged sagittally. Mild disc bulging and moderate facet arthrosis without evidence of significant stenosis. L1-2: Circumferential disc bulging eccentric to the left and moderate fac

## 2021-06-02 NOTE — TOC Transition Note (Signed)
Transition of Care (TOC) - CM/SW Discharge Note ? ? ?Patient Details  ?Name: Sherry [name] ?MRN: [id number] ?Date of Birth: Nov 25, 1930 ? ?Transition of Care (TOC) CM/SW Contact:  ?Angelita Ingles, RN ?Phone [phone] ? ?06/02/2021, 12:16 PM ? ? ?Clinical Narrative:    ?Patient has discharge orders. Active with [facility]. CM notified Chales Salmon with [facility] to make her aware of the discharge in order to resume home health services.  ? ? ?  ?  ? ? ?Patient Goals and CMS Choice ?  ?  ?  ? ?Discharge Placement ?  ?           ?  ?  ?  ?  ? ?Discharge Plan and Services ?  ?  ?           ?  ?  ?  ?  ?  ?  ?  ?  ?  ?  ? ?Social Determinants of Health (SDOH) Interventions ?  ? ? ?Readmission Risk Interventions ?   ? View : No data to display.  ?  ?  ?  ? ? ? ? ? ?

## 2021-11-25 ENCOUNTER — Other Ambulatory Visit (HOSPITAL_COMMUNITY): Payer: Self-pay

## 2021-12-14 IMAGING — CT CT HEAD W/O CM
3 series · 15 of 47 positions shown, 18 images · non-contrast
Comparison: 11/25/2017

CLINICAL DATA: Fall. Right-sided laceration to the head.

EXAM:
CT HEAD WITHOUT CONTRAST
TECHNIQUE: Contiguous axial images were obtained from the base of the skull
through the vertex without intravenous contrast.

[Series 3: head wo · axial · 0.42mm/px · z∈[-99,+26]mm · 9 of 30 slices shown, 12 images]
[im 3/30  brain]
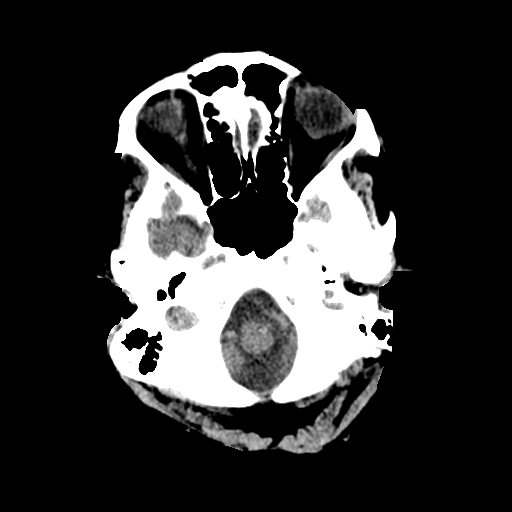
[im 3/30  bone]
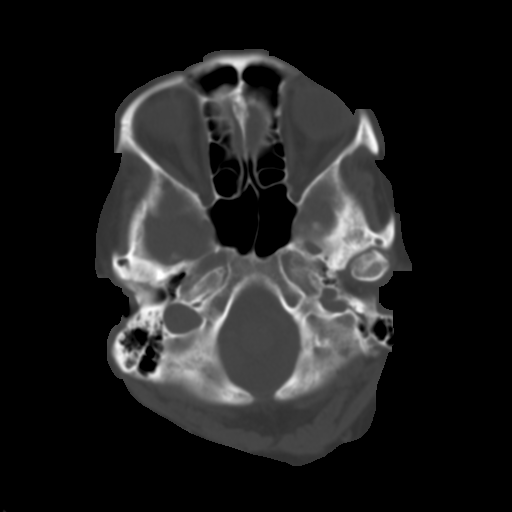
[im 6/30  brain]
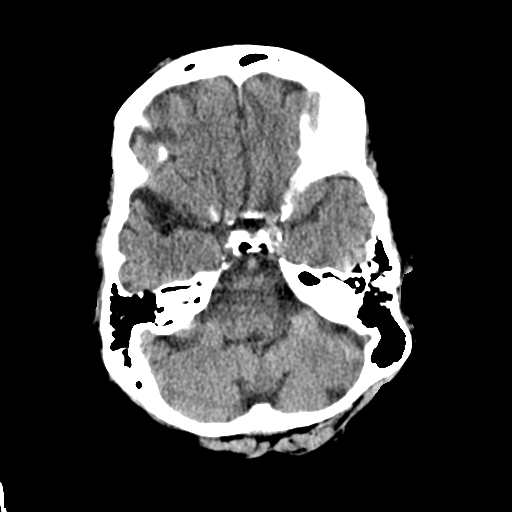
[im 9/30  brain]
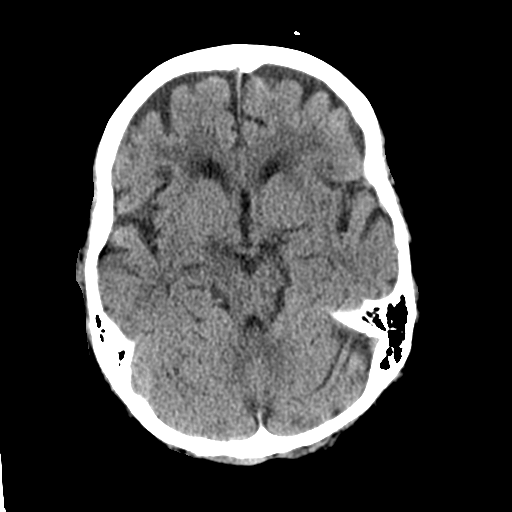
[im 12/30  brain]
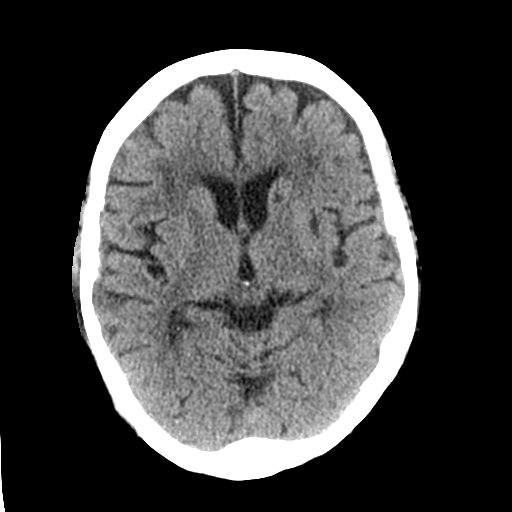
[im 16/30  brain]
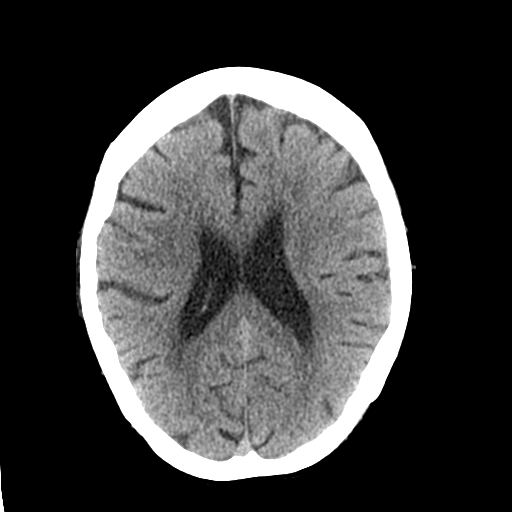
[im 16/30  bone]
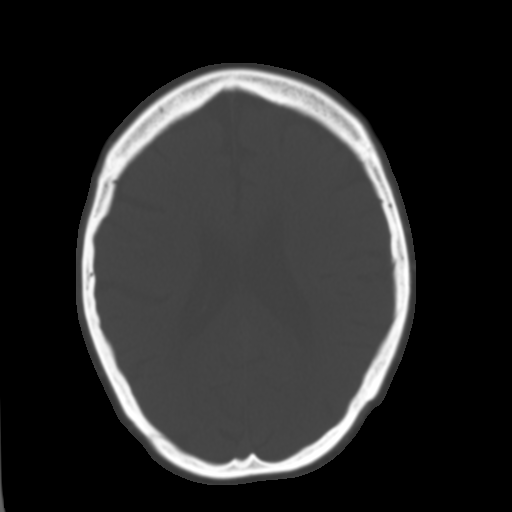
[im 19/30  brain]
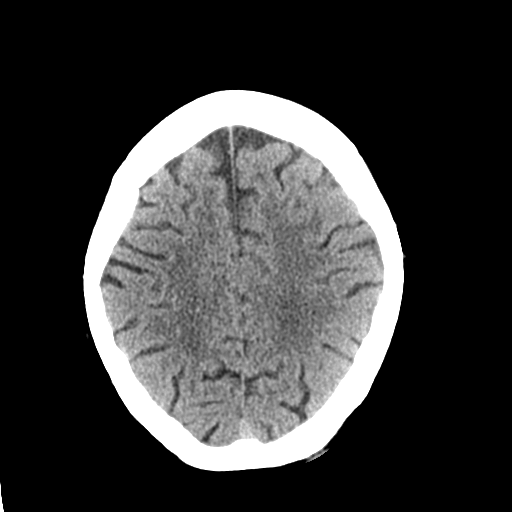
[im 22/30  brain]
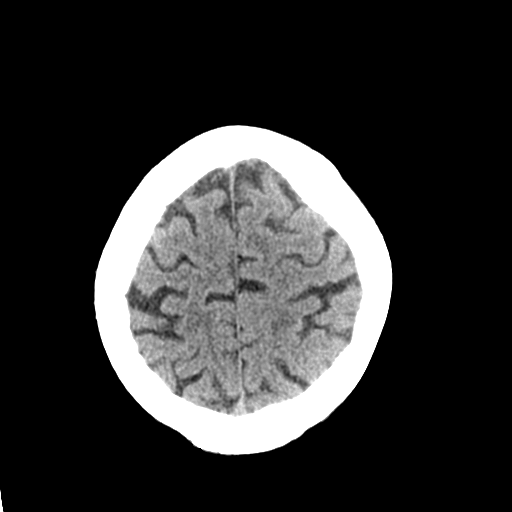
[im 25/30  brain]
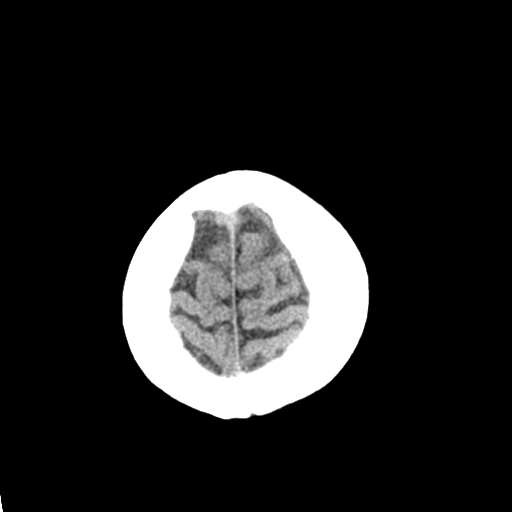
[im 28/30  brain]
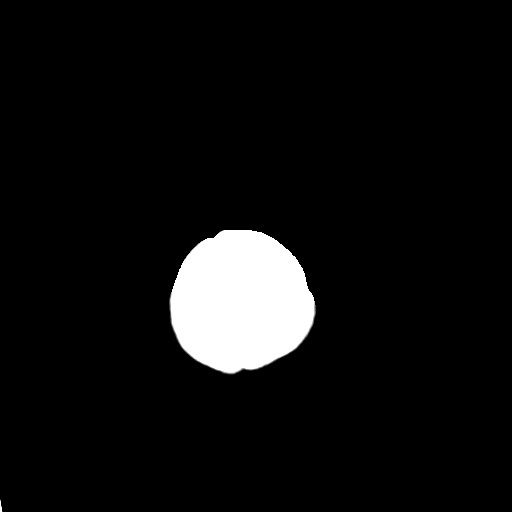
[im 28/30  bone]
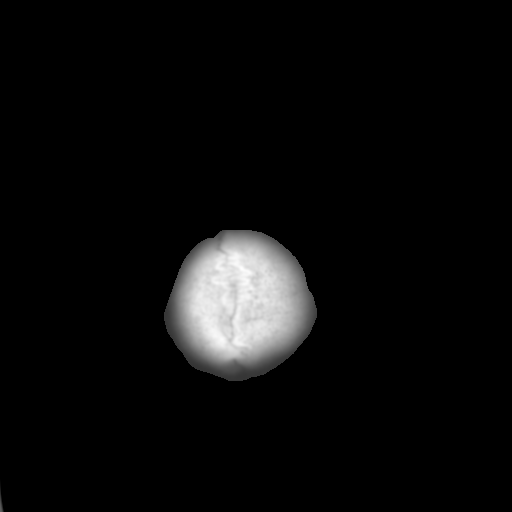

[Series 4: coronal soft tissue · coronal · 0.33mm/px · 3 of 64 slices shown]
[im 22/64  brain]
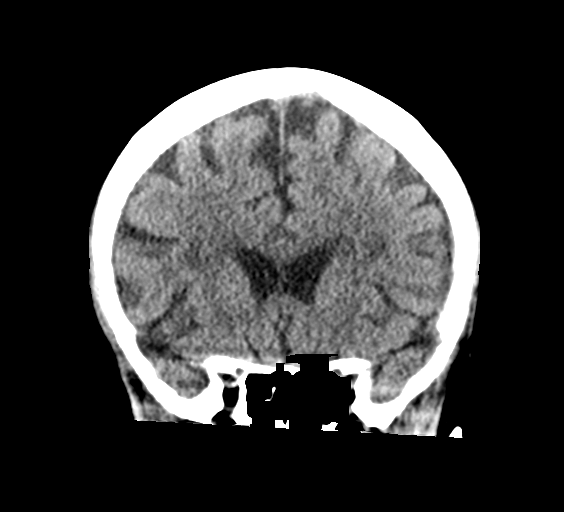
[im 29/64  brain]
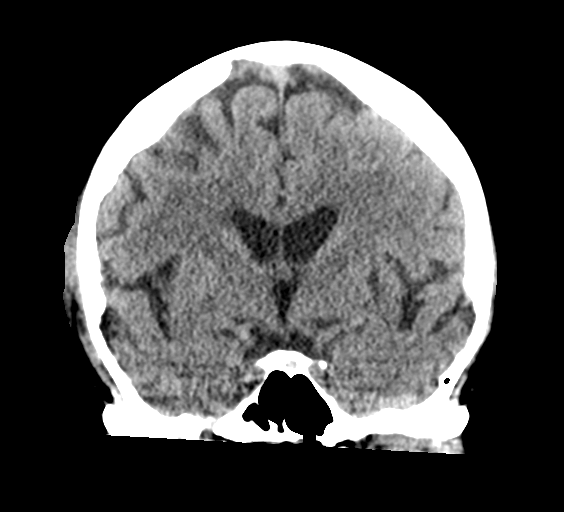
[im 36/64  brain]
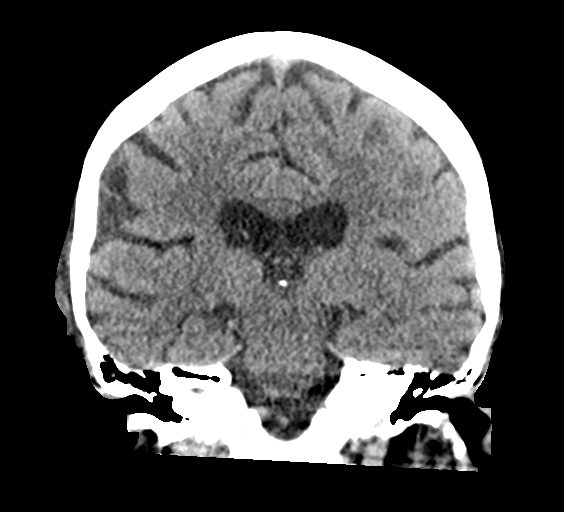

[Series 5: sagittal soft tissue · sagittal · 0.34mm/px · 3 of 55 slices shown]
[im 19/55  brain]
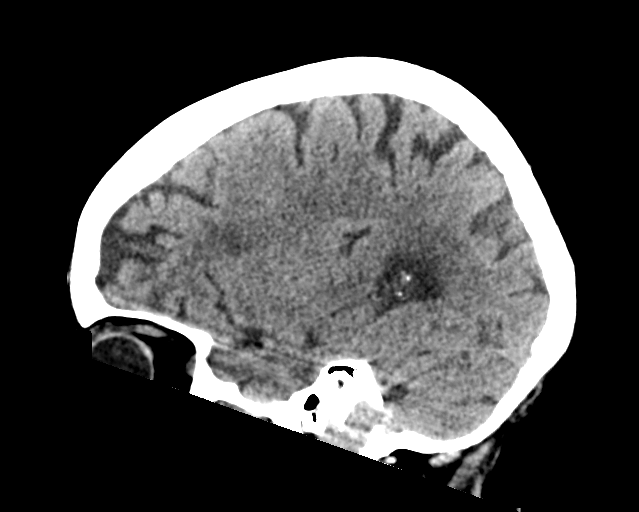
[im 28/55  brain]
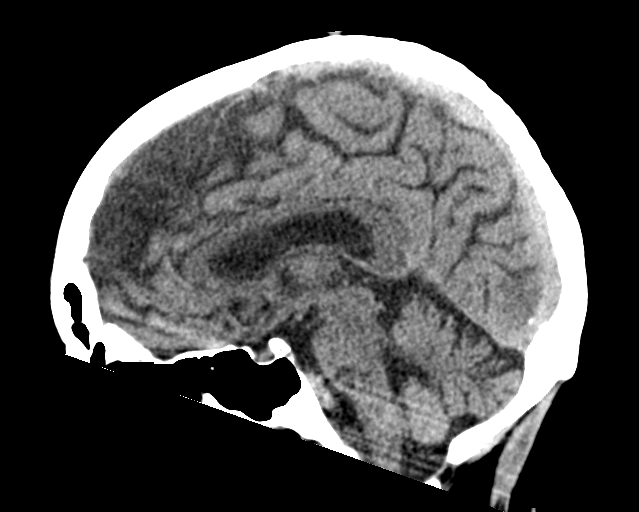
[im 37/55  brain]
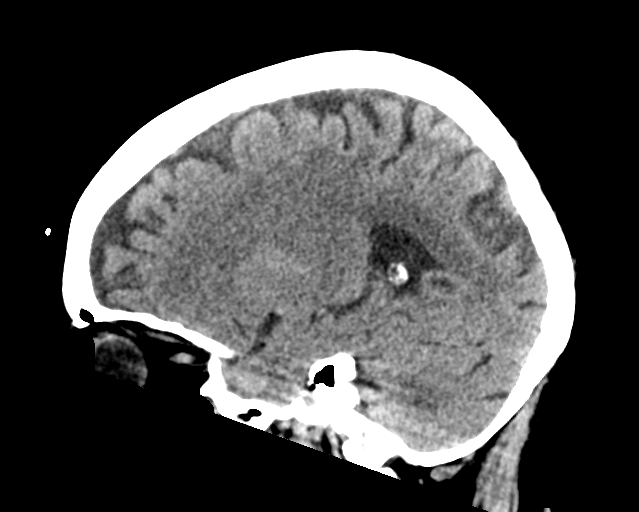

[15 of 47 positions shown; findings below may reference images not displayed]

FINDINGS: Brain: There is no evidence of acute infarct, intracranial
hemorrhage, mass, midline shift, or extra-axial fluid collection. A
chronic lacunar infarct is again noted in the left caudate head.
Hypodensities in the cerebral white matter bilaterally are
nonspecific but compatible with mild chronic small vessel ischemic
disease and may have slightly progressed from the prior study. A
small region of encephalomalacia anteriorly in the right frontal
lobe is unchanged and likely related to remote trauma. Mild cerebral
atrophy is within normal limits for age.

Vascular: Calcified atherosclerosis at the skull base. No hyperdense
vessel.

Skull: No fracture or suspicious osseous lesion.

Sinuses/Orbits: Visualized paranasal sinuses and mastoid air cells
are clear. Visualized orbits are unremarkable.

Other: None.
IMPRESSION: 1. No evidence of acute intracranial abnormality.
2. Mild chronic small vessel ischemic disease.
3. Posttraumatic encephalomalacia in the right frontal lobe.

## 2021-12-30 ENCOUNTER — Emergency Department: Payer: Medicare Other

## 2021-12-30 ENCOUNTER — Emergency Department
Admission: EM | Admit: 2021-12-30 | Discharge: 2021-12-30 | Disposition: A | Discharge status: Home / Self Care | Payer: Medicare Other | Attending: Emergency Medicine | Admitting: Emergency Medicine

## 2021-12-30 DIAGNOSIS — S0990XA Unspecified injury of head, initial encounter: Secondary | ICD-10-CM

## 2021-12-30 DIAGNOSIS — W01198A Fall on same level from slipping, tripping and stumbling with subsequent striking against other object, initial encounter: Secondary | ICD-10-CM | POA: Insufficient documentation

## 2021-12-30 DIAGNOSIS — S0083XA Contusion of other part of head, initial encounter: Secondary | ICD-10-CM | POA: Diagnosis not present

## 2021-12-30 DIAGNOSIS — W19XXXA Unspecified fall, initial encounter: Secondary | ICD-10-CM

## 2021-12-30 LAB — CBC
HCT: 39.5 % (ref 36.0–46.0)
Hemoglobin: 12.4 g/dL (ref 12.0–15.0)
MCH: 27.2 pg (ref 26.0–34.0)
MCHC: 31.4 g/dL (ref 30.0–36.0)
MCV: 86.6 fL (ref 80.0–100.0)
Platelets: 305 10*3/uL (ref 150–400)
RBC: 4.56 MIL/uL (ref 3.87–5.11)
RDW: 14.6 % (ref 11.5–15.5)
WBC: 8.6 10*3/uL (ref 4.0–10.5)
nRBC: 0 % (ref 0.0–0.2)

## 2021-12-30 LAB — BASIC METABOLIC PANEL
Anion gap: 10 (ref 5–15)
BUN: 34 mg/dL — ABNORMAL HIGH (ref 8–23)
CO2: 25 mmol/L (ref 22–32)
Calcium: 9.7 mg/dL (ref 8.9–10.3)
Chloride: 107 mmol/L (ref 98–111)
Creatinine, Ser: 0.75 mg/dL (ref 0.44–1.00)
GFR, Estimated: >60 mL/min (ref >60)
Glucose, Bld: 120 mg/dL — ABNORMAL HIGH (ref 70–99)
Potassium: 3.8 mmol/L (ref 3.5–5.1)
Sodium: 142 mmol/L (ref 135–145)

## 2021-12-30 LAB — CBG MONITORING, ED: Glucose-Capillary: 100 mg/dL — ABNORMAL HIGH (ref 70–99)

## 2021-12-30 MED ORDER — LABETALOL HCL 5 MG/ML IV SOLN
10.0000 mg | Freq: Once | INTRAVENOUS | Status: AC
  Administered 2021-12-30: 10 mg via INTRAVENOUS
  Filled 2021-12-30: qty 4

## 2021-12-30 MED ORDER — ONDANSETRON HCL 4 MG/2ML IJ SOLN
4.0000 mg | Freq: Once | INTRAMUSCULAR | Status: AC
  Administered 2021-12-30: 4 mg via INTRAVENOUS
  Filled 2021-12-30: qty 2

## 2021-12-30 MED ORDER — ACETAMINOPHEN 325 MG PO TABS
650.0000 mg | ORAL_TABLET | Freq: Once | ORAL | Status: AC
  Administered 2021-12-30: 650 mg via ORAL
  Filled 2021-12-30: qty 2

## 2021-12-30 NOTE — ED Notes (Signed)
Pt in MRI.

## 2021-12-30 NOTE — ED Notes (Signed)
Blood pressure elevated   dr Corky Downs aware.  Md in with pt again.  Family with pt.  Iv dc'ed  pt forgot to take meds today.  D/c pt per dr Corky Downs order.  Pt taken out via wheelchair.

## 2021-12-30 NOTE — ED Notes (Signed)
MRI at [facility] ready for pt

## 2021-12-30 NOTE — ED Notes (Signed)
Pt poorly tolerates and resists automatic BP. Readings may be erroneous d/t flexing, moving, moaning. Will assess manual.

## 2021-12-30 NOTE — ED Notes (Signed)
[place] applied

## 2021-12-30 NOTE — ED Notes (Signed)
EDP at [facility]. Husband at [facility].

## 2021-12-30 NOTE — ED Triage Notes (Signed)
Pt was at home and fell. Pt sts that she remembers falling. Pt head hit the floor. Pt is complaining of severe neck pain. Pt has ice pack on forehead.

## 2021-12-30 NOTE — ED Provider Notes (Signed)
[facility] Provider Note    Event Date/Time   First MD Initiated Contact with Patient 12/30/21 1451     (approximate)   History   Fall   HPI  Sherry [name] is a 88 y.o. female with a history of closed C1 fracture, orthostatic hypotension presents after a fall.  Patient is not certain why she fell.  She states she did hit her head on the hardwood floor.     Physical Exam   Triage Vital Signs: ED Triage Vitals  Enc Vitals Group     BP 12/30/21 1409 (!) 216/87     Pulse Rate 12/30/21 1409 67     Resp 12/30/21 1409 18     Temp 12/30/21 1409 (!) 97.5 F (36.4 C)     Temp Source 12/30/21 1409 Oral     SpO2 12/30/21 1409 98 %     Weight 12/30/21 1410 59 kg (130 lb)     Height --      Head Circumference --      Peak Flow --      Pain Score 12/30/21 1409 9     Pain Loc --      Pain Edu? --      Excl. in [place]? --     Most recent vital signs: Vitals:   12/30/21 1745 12/30/21 1800  BP:    Pulse: 73 77  Resp:    Temp:    SpO2: 98% 97%     General: Awake, no distress.  CV:  Good peripheral perfusion.  No chest wall tenderness palpation Resp:  Normal effort.  Abd:  No distention.  Soft, nontender Other:  Hematoma to the right forehead, no bleeding, no scalp laceration.  No bony abnormalities palpated.  Moves all extremities equally, no upper or lower extremity pain.  No back pain.  She does report that she had cervical pain initially but that is improved.  She does have a history of a C1 fracture in the past.   ED Results / Procedures / Treatments   Labs (all labs ordered are listed, but only abnormal results are displayed) Labs Reviewed  BASIC METABOLIC PANEL - Abnormal; Notable for the following components:      Result Value   Glucose, Bld 120 (*)    BUN 34 (*)    All other components within normal limits  CBG MONITORING, ED - Abnormal; Notable for the following components:   Glucose-Capillary 100 (*)    All other components  within normal limits  CBC  URINALYSIS, ROUTINE W REFLEX MICROSCOPIC     EKG  ED ECG REPORT I, Lavonia Drafts, the attending physician, personally viewed and interpreted this ECG.  Date: 12/30/2021  Rhythm: normal sinus rhythm QRS Axis: normal Intervals: normal ST/T Wave abnormalities: Nonspecific changes Narrative Interpretation: no evidence of acute ischemia    RADIOLOGY CT head and cervical spine viewed interpreted by me, no acute fracture noted.  Radiology notes frontal lobe density,    PROCEDURES:  Critical Care performed:  Procedures   MEDICATIONS ORDERED IN ED: Medications  acetaminophen (TYLENOL) tablet 650 mg (has no administration in time range)  ondansetron (ZOFRAN) injection 4 mg (4 mg Intravenous Given 12/30/21 1528)  labetalol (NORMODYNE) injection 10 mg (10 mg Intravenous Given 12/30/21 1550)     IMPRESSION / MDM / ASSESSMENT AND PLAN / ED COURSE  I reviewed the triage vital signs and the nursing notes. Patient's presentation is most consistent with acute presentation with potential threat  to life or bodily function.   Patient presents with fall with head injury, neck pain as above.  Differential includes ICH, neck fracture especially in the setting of prior neck fracture  Lab work is reassuring  Radiology is noted small abnormal density in the right frontal lobe inferior to where the hematoma is, discussed with Dr. Leonel Ramsay of neurology who agrees MRI would be appropriate.  I have consulted Dr. Cari Caraway of neurosurgery to discuss CT findings.  Will leave the patient in a collar at this time  MRI of the head and cervical spine did not show any acute traumatic injuries.  Informed patient that she can wear the collar for comfort as needed, outpatient follow-up with Dr. Cari Caraway as needed.  Appropriate for discharge at this time       FINAL CLINICAL IMPRESSION(S) / ED DIAGNOSES   Final diagnoses:  Fall, initial encounter  Injury of head,  initial encounter     Rx / DC Orders   ED Discharge Orders     None        Note:  This document was prepared using Dragon voice recognition software and may include unintentional dictation errors.   Lavonia Drafts, MD 12/30/21 [phone]

## 2021-12-30 NOTE — ED Notes (Signed)
EDP at [facility] 

## 2021-12-30 NOTE — ED Provider Triage Note (Signed)
Emergency Medicine Provider Triage Evaluation Note  Sherry [name] , a 88 y.o. female  was evaluated in triage.  Pt complains of unwitnessed fall at home prior to arrival. She isn't sure what caused her to fall. She hit her head on the hardwood floor. Now complaining of neck pain and pain to forehead. She denies loss of consciousness.  Physical Exam  There were no vitals taken for this visit. Gen:   Awake, no distress   Resp:  Normal effort  MSK:   Moves extremities without difficulty  Other:    Medical Decision Making  Medically screening exam initiated at 2:09 PM.  Appropriate orders placed.  [name] was informed that the remainder of the evaluation will be completed by another provider, this initial triage assessment does not replace that evaluation, and the importance of remaining in the ED until their evaluation is complete.    Victorino Dike, FNP 12/30/21 1411

## 2022-01-03 ENCOUNTER — Encounter (HOSPITAL_COMMUNITY): Payer: Self-pay | Admitting: Emergency Medicine

## 2022-01-03 ENCOUNTER — Other Ambulatory Visit: Payer: Self-pay

## 2022-01-03 ENCOUNTER — Inpatient Hospital Stay (HOSPITAL_COMMUNITY)
Admission: EM | Admit: 2022-01-03 | Discharge: 2022-01-10 | DRG: 071 | Disposition: A | Discharge status: Home Health | Payer: Medicare Other | Source: Other Acute Inpatient Hospital | Attending: Internal Medicine | Admitting: Internal Medicine

## 2022-01-03 ENCOUNTER — Emergency Department (HOSPITAL_COMMUNITY): Payer: Medicare Other

## 2022-01-03 DIAGNOSIS — E876 Hypokalemia: Secondary | ICD-10-CM | POA: Diagnosis not present

## 2022-01-03 DIAGNOSIS — M5432 Sciatica, left side: Secondary | ICD-10-CM | POA: Diagnosis present

## 2022-01-03 DIAGNOSIS — I951 Orthostatic hypotension: Secondary | ICD-10-CM | POA: Diagnosis present

## 2022-01-03 DIAGNOSIS — G9340 Encephalopathy, unspecified: Secondary | ICD-10-CM | POA: Diagnosis not present

## 2022-01-03 DIAGNOSIS — Z9181 History of falling: Secondary | ICD-10-CM

## 2022-01-03 DIAGNOSIS — S129XXA Fracture of neck, unspecified, initial encounter: Secondary | ICD-10-CM | POA: Diagnosis present

## 2022-01-03 DIAGNOSIS — I160 Hypertensive urgency: Secondary | ICD-10-CM | POA: Diagnosis not present

## 2022-01-03 DIAGNOSIS — R4701 Aphasia: Secondary | ICD-10-CM | POA: Diagnosis present

## 2022-01-03 DIAGNOSIS — G9341 Metabolic encephalopathy: Principal | ICD-10-CM | POA: Diagnosis present

## 2022-01-03 DIAGNOSIS — G9389 Other specified disorders of brain: Secondary | ICD-10-CM | POA: Diagnosis present

## 2022-01-03 DIAGNOSIS — R4182 Altered mental status, unspecified: Principal | ICD-10-CM

## 2022-01-03 DIAGNOSIS — B9620 Unspecified Escherichia coli [E. coli] as the cause of diseases classified elsewhere: Secondary | ICD-10-CM | POA: Diagnosis present

## 2022-01-03 DIAGNOSIS — E785 Hyperlipidemia, unspecified: Secondary | ICD-10-CM | POA: Diagnosis present

## 2022-01-03 DIAGNOSIS — W1830XA Fall on same level, unspecified, initial encounter: Secondary | ICD-10-CM | POA: Diagnosis present

## 2022-01-03 DIAGNOSIS — S060XAA Concussion with loss of consciousness status unknown, initial encounter: Secondary | ICD-10-CM | POA: Diagnosis present

## 2022-01-03 DIAGNOSIS — Z79899 Other long term (current) drug therapy: Secondary | ICD-10-CM

## 2022-01-03 DIAGNOSIS — Z8249 Family history of ischemic heart disease and other diseases of the circulatory system: Secondary | ICD-10-CM

## 2022-01-03 DIAGNOSIS — S12000A Unspecified displaced fracture of first cervical vertebra, initial encounter for closed fracture: Secondary | ICD-10-CM | POA: Diagnosis present

## 2022-01-03 DIAGNOSIS — G44309 Post-traumatic headache, unspecified, not intractable: Secondary | ICD-10-CM | POA: Diagnosis present

## 2022-01-03 DIAGNOSIS — S0990XD Unspecified injury of head, subsequent encounter: Secondary | ICD-10-CM

## 2022-01-03 DIAGNOSIS — Z791 Long term (current) use of non-steroidal anti-inflammatories (NSAID): Secondary | ICD-10-CM

## 2022-01-03 DIAGNOSIS — S12110A Anterior displaced Type II dens fracture, initial encounter for closed fracture: Secondary | ICD-10-CM | POA: Diagnosis present

## 2022-01-03 DIAGNOSIS — F419 Anxiety disorder, unspecified: Secondary | ICD-10-CM | POA: Diagnosis present

## 2022-01-03 DIAGNOSIS — I444 Left anterior fascicular block: Secondary | ICD-10-CM | POA: Diagnosis present

## 2022-01-03 DIAGNOSIS — Z885 Allergy status to narcotic agent status: Secondary | ICD-10-CM

## 2022-01-03 DIAGNOSIS — I10 Essential (primary) hypertension: Secondary | ICD-10-CM | POA: Diagnosis present

## 2022-01-03 DIAGNOSIS — F05 Delirium due to known physiological condition: Secondary | ICD-10-CM | POA: Diagnosis present

## 2022-01-03 DIAGNOSIS — M48061 Spinal stenosis, lumbar region without neurogenic claudication: Secondary | ICD-10-CM | POA: Diagnosis present

## 2022-01-03 DIAGNOSIS — N390 Urinary tract infection, site not specified: Secondary | ICD-10-CM | POA: Diagnosis present

## 2022-01-03 LAB — CBC WITH DIFFERENTIAL/PLATELET
Abs Immature Granulocytes: 0.04 10*3/uL (ref 0.00–0.07)
Basophils Absolute: 0.1 10*3/uL (ref 0.0–0.1)
Basophils Relative: 1 %
Eosinophils Absolute: 0 10*3/uL (ref 0.0–0.5)
Eosinophils Relative: 1 %
HCT: 43.2 % (ref 36.0–46.0)
Hemoglobin: 14 g/dL (ref 12.0–15.0)
Immature Granulocytes: 1 %
Lymphocytes Relative: 23 %
Lymphs Abs: 1.8 10*3/uL (ref 0.7–4.0)
MCH: 28 pg (ref 26.0–34.0)
MCHC: 32.4 g/dL (ref 30.0–36.0)
MCV: 86.4 fL (ref 80.0–100.0)
Monocytes Absolute: 0.5 10*3/uL (ref 0.1–1.0)
Monocytes Relative: 7 %
Neutro Abs: 5.3 10*3/uL (ref 1.7–7.7)
Neutrophils Relative %: 67 %
Platelets: 344 10*3/uL (ref 150–400)
RBC: 5 MIL/uL (ref 3.87–5.11)
RDW: 14.1 % (ref 11.5–15.5)
WBC: 7.8 10*3/uL (ref 4.0–10.5)
nRBC: 0 % (ref 0.0–0.2)

## 2022-01-03 LAB — COMPREHENSIVE METABOLIC PANEL
ALT: 14 U/L (ref 0–44)
AST: 20 U/L (ref 15–41)
Albumin: 3.8 g/dL (ref 3.5–5.0)
Alkaline Phosphatase: 60 U/L (ref 38–126)
Anion gap: 17 — ABNORMAL HIGH (ref 5–15)
BUN: 23 mg/dL (ref 8–23)
CO2: 21 mmol/L — ABNORMAL LOW (ref 22–32)
Calcium: 9.9 mg/dL (ref 8.9–10.3)
Chloride: 98 mmol/L (ref 98–111)
Creatinine, Ser: 0.74 mg/dL (ref 0.44–1.00)
GFR, Estimated: >60 mL/min (ref >60)
Glucose, Bld: 127 mg/dL — ABNORMAL HIGH (ref 70–99)
Potassium: 4.2 mmol/L (ref 3.5–5.1)
Sodium: 136 mmol/L (ref 135–145)
Total Bilirubin: 1.1 mg/dL (ref 0.3–1.2)
Total Protein: 6.2 g/dL — ABNORMAL LOW (ref 6.5–8.1)

## 2022-01-03 MED ORDER — DEXTROSE-NACL 5-0.45 % IV SOLN: INTRAVENOUS | Status: DC

## 2022-01-03 MED ORDER — HYDRALAZINE HCL 20 MG/ML IJ SOLN
10.0000 mg | Freq: Three times a day (TID) | INTRAMUSCULAR | Status: DC | PRN
  Filled 2022-01-03: qty 1

## 2022-01-03 MED ORDER — HYDRALAZINE HCL 20 MG/ML IJ SOLN
10.0000 mg | Freq: Once | INTRAMUSCULAR | Status: AC
  Administered 2022-01-03: 10 mg via INTRAVENOUS
  Filled 2022-01-03: qty 1

## 2022-01-03 MED ORDER — HYDROMORPHONE HCL 1 MG/ML IJ SOLN
0.5000 mg | INTRAMUSCULAR | Status: DC | PRN
  Administered 2022-01-04 – 2022-01-07 (×10): 0.5 mg via INTRAVENOUS
  Filled 2022-01-03 (×4): qty 0.5
  Filled 2022-01-03: qty 1
  Filled 2022-01-03 (×4): qty 0.5
  Filled 2022-01-03: qty 1

## 2022-01-03 MED ORDER — LORAZEPAM 2 MG/ML IJ SOLN
0.5000 mg | Freq: Once | INTRAMUSCULAR | Status: AC
  Administered 2022-01-03: 0.5 mg via INTRAVENOUS
  Filled 2022-01-03: qty 1

## 2022-01-03 MED ORDER — ENOXAPARIN SODIUM 40 MG/0.4ML IJ SOSY
40.0000 mg | PREFILLED_SYRINGE | INTRAMUSCULAR | Status: DC
  Administered 2022-01-04: 40 mg via SUBCUTANEOUS
  Filled 2022-01-03 (×2): qty 0.4

## 2022-01-03 MED ORDER — ONDANSETRON HCL 4 MG/2ML IJ SOLN
4.0000 mg | Freq: Four times a day (QID) | INTRAMUSCULAR | Status: DC | PRN
  Administered 2022-01-05 – 2022-01-07 (×3): 4 mg via INTRAVENOUS
  Filled 2022-01-03 (×3): qty 2

## 2022-01-03 MED ORDER — METOCLOPRAMIDE HCL 5 MG/ML IJ SOLN
10.0000 mg | Freq: Once | INTRAMUSCULAR | Status: AC
  Administered 2022-01-03: 10 mg via INTRAVENOUS
  Filled 2022-01-03: qty 2

## 2022-01-03 MED ORDER — ONDANSETRON HCL 4 MG PO TABS: 4.0000 mg | ORAL_TABLET | Freq: Four times a day (QID) | ORAL | Status: DC | PRN

## 2022-01-03 MED ORDER — IRBESARTAN 300 MG PO TABS
300.0000 mg | ORAL_TABLET | Freq: Once | ORAL | Status: AC
  Administered 2022-01-03: 300 mg via ORAL
  Filled 2022-01-03: qty 1

## 2022-01-03 MED ORDER — AMLODIPINE BESYLATE 5 MG PO TABS
10.0000 mg | ORAL_TABLET | Freq: Once | ORAL | Status: AC
  Administered 2022-01-03: 10 mg via ORAL
  Filled 2022-01-03: qty 2

## 2022-01-03 MED ORDER — FENTANYL CITRATE PF 50 MCG/ML IJ SOSY
50.0000 ug | PREFILLED_SYRINGE | Freq: Once | INTRAMUSCULAR | Status: AC
  Administered 2022-01-03: 50 ug via INTRAVENOUS
  Filled 2022-01-03: qty 1

## 2022-01-03 NOTE — ED Notes (Signed)
MD Jonelle Sidle made aware of lack of urine output. Bladder scan 168.

## 2022-01-03 NOTE — H&P (Signed)
History and Physical    Patient: Sherry [name]:[id number] DOB: [birth year] DOA: 01/03/2022 DOS: the patient was seen and examined on 01/03/2022 PCP: Rusty Aus, MD  Patient coming from: Outside Hospital  Chief Complaint:  Chief Complaint  Patient presents with   Headache   Altered Mental Status   HPI: Sherry [name] is a 88 y.o. female with medical history significant of essential hypertension, recent fall with neck injury and was seen at [facility] with a negative head CT but some neck injury who was sent home with husband but apparently continued to decline.  Patient had a fall where she hit her head.  She had a C1 for closed fracture with orthostatic hypotension.  She hit the head on a hardwood floor.  She was also found to have hypertensive urgency at the time with initial blood pressure 260/87.  This was on December 30, 2021.  Head CT at the time showed small abnormal density in the right frontal lobe inferior to hematoma from the fall.  At the time of arrival was done that showed no acute findings.  It did show encephalomalacia on basis of the anterior right frontal lobe consistent with prior traumatic injury.  There is also underlying chronic microvascular ischemic change otherwise mainly involving the right frontal scalp contusion.  MRI of the cervical spine did show the C1 closed fracture.  As of today patient has continued to be confused but not agitated husband is worried that she has not improved and unable to keep her at home.  We are admitting the patient for further evaluation and treatment.  She is unable to give me any history.  She is only mildly arousable.  Review of Systems: As mentioned in the history of present illness. All other systems reviewed and are negative. Past Medical History:  Diagnosis Date   Actinic keratosis    Hypertension    Past Surgical History:  Procedure Laterality Date   BACK SURGERY     IR INJECT/THERA/INC  NEEDLE/CATH/PLC EPI/LUMB/SAC W/IMG  04/29/2021   MOHS SURGERY     Social History:  reports that she has never smoked. She has never used smokeless tobacco. She reports current alcohol use. She reports that she does not use drugs.  Allergies  Allergen Reactions   Morphine     Other reaction(s): Other (See Comments) Apnea with large dose Respiratory Distress    Buprenorphine Hcl     Other reaction(s): Other (See Comments), Other (See Comments) Respiratory Distress Respiratory Distress    Codeine    Epinephrine     Other reaction(s): Other (See Comments) Severe anxiety; "makes my heart race" - would like to avoid during surgery   Morphine And Related Other (See Comments)    Respiratory Distress    Percocet [Oxycodone-Acetaminophen]     Family History  Problem Relation Age of Onset   Hypertension Father     Prior to Admission medications   Medication Sig Start Date End Date Taking? Authorizing Provider  acetaminophen (TYLENOL) 500 MG tablet Take 1,000 mg by mouth every 6 (six) hours as needed for mild pain or fever.    [provider]  amLODipine (NORVASC) 10 MG tablet Take 1 tablet (10 mg total) by mouth daily. 05/06/21   Fritzi Mandes, MD  Biotin 5 MG TABS Take 5 mg by mouth daily.    [provider]  cholecalciferol (VITAMIN D) 25 MCG (1000 UNIT) tablet Take 2,000 Units by mouth daily.    [provider]  Cyanocobalamin (B-12) 1000 MCG CAPS Take 1,000 mcg by mouth daily.    [provider]  famotidine (PEPCID) 20 MG tablet Take 1 tablet (20 mg total) by mouth daily. 06/02/21   Nita Sells, MD  gabapentin (NEURONTIN) 100 MG capsule Take 2 capsules (200 mg total) by mouth 3 (three) times daily. 06/01/21   Nita Sells, MD  hydrALAZINE (APRESOLINE) 10 MG tablet Take 1 tablet (10 mg total) by mouth every 8 (eight) hours. 05/26/21   Annita Brod, MD  irbesartan (AVAPRO) 300 MG tablet Take 1 tablet (300 mg total) by mouth daily.  05/27/21   Annita Brod, MD  naproxen (NAPROSYN) 375 MG tablet Take 1 tablet (375 mg total) by mouth 2 (two) times daily with a meal. 06/01/21   Nita Sells, MD  Oxycodone HCl 10 MG TABS Take 0.5-1 tablets (5-10 mg total) by mouth every 8 (eight) hours as needed. 06/01/21   Nita Sells, MD  pantoprazole (PROTONIX) 40 MG tablet Take 1 tablet (40 mg total) by mouth 2 (two) times daily. 06/01/21   Nita Sells, MD    Physical Exam: Vitals:   01/03/22 1715 01/03/22 1745 01/03/22 1800 01/03/22 1815  BP: (!) 187/84 (!) 167/64 (!) 169/66 (!) 178/67  Pulse: (!) 110 (!) 113 (!) 113 (!) 114  Resp: (!) '24 20 20 20  '$ Temp: 99.1 F (37.3 C)     TempSrc: Axillary     SpO2: 97% 96% 96% 95%   Constitutional: Obtunded, mild irritability, NAD, calm, comfortable Eyes: PERRL, lids and conjunctivae normal ENMT: Mucous membranes are moist. Posterior pharynx clear of any exudate or lesions.Normal dentition.  Neck: normal, supple, no masses, no thyromegaly Respiratory: clear to auscultation bilaterally, no wheezing, no crackles. Normal respiratory effort. No accessory muscle use.  Cardiovascular: Sinus tachycardia, no murmurs / rubs / gallops. No extremity edema. 2+ pedal pulses. No carotid bruits.  Abdomen: no tenderness, no masses palpated. No hepatosplenomegaly. Bowel sounds positive.  Musculoskeletal: Good range of motion, no joint swelling or tenderness, Skin: no rashes, lesions, ulcers. No induration Neurologic: CN 2-12 grossly intact. Sensation intact, DTR normal. Strength 5/5 in all 4.  Psychiatric: Patient is delirious, also anxious  data Reviewed:  Chemistry largely within normal with glucose 127 total protein 6.2.  CBC entirely within normal, chest x-ray showed no active disease,  Assessment and Plan:  #1 acute metabolic encephalopathy: Most likely multifactorial.  Related to hypertensive urgency, recent brain injury probably concoction.  Patient will be admitted.  We  will get repeat MRI of the brain.  Hydrate and supportive care.  Consider neurology consultation.  #2 malignant hypertension: Initiate home blood pressure medications.  IV hydralazine as needed  #3 status post fall with head injury: Once patient is stable expedite PT and OT consult  #4 hyperlipidemia: Continue statin  #5 anxiety disorder: Continue home regiment    Advance Care Planning:   Code Status: Prior full code  Consults: Dr. Rory Percy neurology  Family Communication: Husband  Severity of Illness: The appropriate patient status for this patient is INPATIENT. Inpatient status is judged to be reasonable and necessary in order to provide the required intensity of service to ensure the patient's safety. The patient's presenting symptoms, physical exam findings, and initial radiographic and laboratory data in the context of their chronic comorbidities is felt to place them at high risk for further clinical deterioration. Furthermore, it is not anticipated that the patient will be medically stable for discharge from the hospital within 2 midnights  of admission.   * I certify that at the point of admission it is my clinical judgment that the patient will require inpatient hospital care spanning beyond 2 midnights from the point of admission due to high intensity of service, high risk for further deterioration and high frequency of surveillance required.*  AuthorBarbette Merino, MD 01/03/2022 7:32 PM  For on call review [email or web address].

## 2022-01-03 NOTE — ED Triage Notes (Signed)
Patient BIB [facility] w/ complaints of AMS since being evaluated after a fall on Thursday the 1st. Patient was seen at [facility] and had a head CT which was clear however per EMS patient has progressively become confused over the last few days. Patient w/ bruises to the eyes bilaterally, c/o blurred, nauseated. Per EMS patient is normally Aox4 w/ hx of dementia.    PT BP: 229/107   EMS gave 4 mg of zofran PTA.

## 2022-01-03 NOTE — ED Notes (Signed)
MD Jonelle Sidle paged regarding patients change in mental status.

## 2022-01-03 NOTE — ED Provider Notes (Signed)
[place] EMERGENCY DEPARTMENT Provider Note   CSN: [id number] Arrival date & time: 01/03/22  1324     History  Chief Complaint  Patient presents with   Headache   Altered Mental Status    Sherry [name] is a 88 y.o. female.  Patient is an 88 year old female with a history of hypertension and prior C1 fracture who was seen 4 days ago after having a fall of unclear cause.  At the time patient was awake and alert.  She was complaining of headache and some facial pain.  Initially complained of neck pain but reports it had resolved.  While in the emergency room she had a CT of her head and neck that did show nonspecific findings.  There is no intracranial hemorrhage at the time.  Patient had an MRI of her brain and cervical spine that showed an old healed C1 fracture and no evidence of an acute stroke.  They had spoken with neurology and neurosurgery and patient was felt stable for discharge home.  However patient returns today due to headache, vomiting and waxing and waning of mental status.  It was reported that since being home patient's condition has gradually deteriorated.  She is having severe headache at this time and persistent vomiting.  She is able to answer questions here and follows commands but they reported earlier she was having word finding and some aphasia.  Patient does take multiple blood pressure medications at home but has not had them due to the nausea and vomiting.  She does not take any anticoagulation.  The history is provided by the patient, medical records and the EMS personnel.  Headache Altered Mental Status Associated symptoms: headaches        Home Medications Prior to Admission medications   Medication Sig Start Date End Date Taking? Authorizing Provider  acetaminophen (TYLENOL) 500 MG tablet Take 1,000 mg by mouth every 6 (six) hours as needed for mild pain or fever.    [provider]  amLODipine (NORVASC) 10 MG tablet Take  1 tablet (10 mg total) by mouth daily. 05/06/21   Fritzi Mandes, MD  Biotin 5 MG TABS Take 5 mg by mouth daily.    [provider]  cholecalciferol (VITAMIN D) 25 MCG (1000 UNIT) tablet Take 2,000 Units by mouth daily.    [provider]  Cyanocobalamin (B-12) 1000 MCG CAPS Take 1,000 mcg by mouth daily.    [provider]  famotidine (PEPCID) 20 MG tablet Take 1 tablet (20 mg total) by mouth daily. 06/02/21   Nita Sells, MD  gabapentin (NEURONTIN) 100 MG capsule Take 2 capsules (200 mg total) by mouth 3 (three) times daily. 06/01/21   Nita Sells, MD  hydrALAZINE (APRESOLINE) 10 MG tablet Take 1 tablet (10 mg total) by mouth every 8 (eight) hours. 05/26/21   Annita Brod, MD  irbesartan (AVAPRO) 300 MG tablet Take 1 tablet (300 mg total) by mouth daily. 05/27/21   Annita Brod, MD  naproxen (NAPROSYN) 375 MG tablet Take 1 tablet (375 mg total) by mouth 2 (two) times daily with a meal. 06/01/21   Nita Sells, MD  Oxycodone HCl 10 MG TABS Take 0.5-1 tablets (5-10 mg total) by mouth every 8 (eight) hours as needed. 06/01/21   Nita Sells, MD  pantoprazole (PROTONIX) 40 MG tablet Take 1 tablet (40 mg total) by mouth 2 (two) times daily. 06/01/21   Nita Sells, MD      Allergies  Morphine, Buprenorphine hcl, Codeine, Epinephrine, Morphine and related, and Percocet [oxycodone-acetaminophen]    Review of Systems   Review of Systems  Neurological:  Positive for headaches.    Physical Exam Updated Vital Signs BP (!) 239/98   Pulse 89   Temp 97.8 F (36.6 C) (Oral)   Resp 15   SpO2 100%  Physical Exam Vitals and nursing note reviewed.  Constitutional:      General: She is not in acute distress.    Appearance: She is well-developed. She is ill-appearing.  HENT:     Head: Normocephalic.     Comments: Significant forehead hematoma with healing ecchymosis and bilateral raccoon eyes    Ears:     Comments: No notable  hemotympanum but the right TM is partially occluded with cerumen Eyes:     Extraocular Movements: Extraocular movements intact.     Conjunctiva/sclera: Conjunctivae normal.     Pupils: Pupils are equal, round, and reactive to light.  Cardiovascular:     Rate and Rhythm: Normal rate and regular rhythm.     Heart sounds: Normal heart sounds. No murmur heard.    No friction rub.  Pulmonary:     Effort: Pulmonary effort is normal.     Breath sounds: Normal breath sounds. No wheezing or rales.  Abdominal:     General: Bowel sounds are normal. There is no distension.     Palpations: Abdomen is soft.     Tenderness: There is no abdominal tenderness. There is no guarding or rebound.  Musculoskeletal:        General: No tenderness. Normal range of motion.     Cervical back: Normal range of motion and neck supple. No tenderness.     Right lower leg: No edema.     Left lower leg: No edema.     Comments: No edema  Skin:    General: Skin is warm and dry.     Findings: No rash.  Neurological:     Mental Status: She is alert.     Cranial Nerves: No cranial nerve deficit.     Comments: Oriented to person and place.  No notable weakness in the upper extremities and no pronator drift.  5 out of 5 strength in the right lower extremity and 4 out of 5 strength in the left lower extremity.  Speech is clear and no notable aphasia at this time.  Psychiatric:     Comments: Cooperative     ED Results / Procedures / Treatments   Labs (all labs ordered are listed, but only abnormal results are displayed) Labs Reviewed  CBC WITH DIFFERENTIAL/PLATELET  COMPREHENSIVE METABOLIC PANEL    EKG None  Radiology No results found.  Procedures Procedures    Medications Ordered in ED Medications  metoCLOPramide (REGLAN) injection 10 mg (has no administration in time range)    ED Course/ Medical Decision Making/ A&P                           Medical Decision Making Amount and/or Complexity of Data  Reviewed Labs: ordered. Radiology: ordered.  Risk Prescription drug management.   Pt with multiple medical problems and comorbidities and presenting today with a complaint that caries a high risk for morbidity and mortality.  Here today with waxing and waning mental status, hypertension, severe headache and vomiting after a fall 4 days ago where she fell on her face on a hardwood floor.  She was [facility] and at that time had CT and MRI of her brain and neck.  At that time she was not found to have any intracranial hemorrhage or new cervical fractures.  She unfortunately has been vomiting and having severe headaches at home.  Concern for severe concussion versus delayed bleed.  Low suspicion for stroke at this time.  Patient does not take any anticoagulation.  She was given Zofran by EMS but continues to complain of a severe headache and is noted to be hypertensive here.  It does appear that patient is supposed to be taking amlodipine, hydralazine and hyper Sartain which she has not had due to the vomiting.  She was given an IV dose of her hydralazine.  We will attempt to control the nausea so that she can take the oral medications.         Final Clinical Impression(s) / ED Diagnoses Final diagnoses:  None    Rx / DC Orders ED Discharge Orders     None         Blanchie Dessert, MD 01/03/22 1545

## 2022-01-03 NOTE — ED Provider Notes (Signed)
Signout from Dr. Maryan Rued.  88 year old female who had a significant fall few days ago was seen at [facility] had a negative head CT.  Since discharge has had worsening headache and vomiting and possibly some change in mental status.  She is pending CT head and neck along with some basic labs.  Disposition per results of testing. Physical Exam  BP (!) 222/76   Pulse 94   Temp 97.8 F (36.6 C) (Oral)   Resp 20   SpO2 97%   Physical Exam  Procedures  Procedures  ED Course / MDM    Medical Decision Making Amount and/or Complexity of Data Reviewed Labs: ordered. Radiology: ordered.  Risk Prescription drug management. Decision regarding hospitalization.   CT head and cervical spine do not show any acute findings, does have an old dens fracture.  Lab work fairly unremarkable awaiting urine.  Blood pressure still quite elevated.  Sounds like with her vomiting patient has not been taking her medications so this may be related to that.  We will give another dose of hydralazine and reassess.  Husband here and states she never should have gone home from [place].  She has been having headache and pain all over she has been unsteady on her feet and nausea and vomiting.  Cannot keep down her medicines.  He said yesterday she was altered and confused.  Patient is somnolent arousable confused.  Do not feel she is safe for discharge.  Discussed with [facility] hospitalist Dr. Jonelle Sidle who will evaluate patient for admission.       Hayden Rasmussen, MD 01/04/22 [phone]

## 2022-01-04 ENCOUNTER — Observation Stay (HOSPITAL_COMMUNITY): Payer: Medicare Other

## 2022-01-04 DIAGNOSIS — Z885 Allergy status to narcotic agent status: Secondary | ICD-10-CM | POA: Diagnosis not present

## 2022-01-04 DIAGNOSIS — Z791 Long term (current) use of non-steroidal anti-inflammatories (NSAID): Secondary | ICD-10-CM | POA: Diagnosis not present

## 2022-01-04 DIAGNOSIS — F419 Anxiety disorder, unspecified: Secondary | ICD-10-CM | POA: Diagnosis present

## 2022-01-04 DIAGNOSIS — Z8249 Family history of ischemic heart disease and other diseases of the circulatory system: Secondary | ICD-10-CM | POA: Diagnosis not present

## 2022-01-04 DIAGNOSIS — Z9181 History of falling: Secondary | ICD-10-CM | POA: Diagnosis not present

## 2022-01-04 DIAGNOSIS — I10 Essential (primary) hypertension: Secondary | ICD-10-CM

## 2022-01-04 DIAGNOSIS — N390 Urinary tract infection, site not specified: Secondary | ICD-10-CM | POA: Diagnosis present

## 2022-01-04 DIAGNOSIS — G9389 Other specified disorders of brain: Secondary | ICD-10-CM | POA: Diagnosis present

## 2022-01-04 DIAGNOSIS — G9341 Metabolic encephalopathy: Secondary | ICD-10-CM | POA: Diagnosis present

## 2022-01-04 DIAGNOSIS — M48061 Spinal stenosis, lumbar region without neurogenic claudication: Secondary | ICD-10-CM

## 2022-01-04 DIAGNOSIS — N3000 Acute cystitis without hematuria: Secondary | ICD-10-CM

## 2022-01-04 DIAGNOSIS — S060XAA Concussion with loss of consciousness status unknown, initial encounter: Secondary | ICD-10-CM | POA: Diagnosis present

## 2022-01-04 DIAGNOSIS — E785 Hyperlipidemia, unspecified: Secondary | ICD-10-CM | POA: Diagnosis present

## 2022-01-04 DIAGNOSIS — G44309 Post-traumatic headache, unspecified, not intractable: Secondary | ICD-10-CM | POA: Diagnosis present

## 2022-01-04 DIAGNOSIS — W1830XA Fall on same level, unspecified, initial encounter: Secondary | ICD-10-CM | POA: Diagnosis present

## 2022-01-04 DIAGNOSIS — Z79899 Other long term (current) drug therapy: Secondary | ICD-10-CM | POA: Diagnosis not present

## 2022-01-04 DIAGNOSIS — G9340 Encephalopathy, unspecified: Secondary | ICD-10-CM | POA: Insufficient documentation

## 2022-01-04 DIAGNOSIS — B9620 Unspecified Escherichia coli [E. coli] as the cause of diseases classified elsewhere: Secondary | ICD-10-CM | POA: Diagnosis present

## 2022-01-04 DIAGNOSIS — M5432 Sciatica, left side: Secondary | ICD-10-CM | POA: Diagnosis present

## 2022-01-04 DIAGNOSIS — S12110A Anterior displaced Type II dens fracture, initial encounter for closed fracture: Secondary | ICD-10-CM | POA: Diagnosis present

## 2022-01-04 DIAGNOSIS — I951 Orthostatic hypotension: Secondary | ICD-10-CM | POA: Diagnosis present

## 2022-01-04 DIAGNOSIS — F05 Delirium due to known physiological condition: Secondary | ICD-10-CM | POA: Diagnosis present

## 2022-01-04 DIAGNOSIS — S060X0A Concussion without loss of consciousness, initial encounter: Secondary | ICD-10-CM | POA: Diagnosis not present

## 2022-01-04 DIAGNOSIS — R4701 Aphasia: Secondary | ICD-10-CM | POA: Diagnosis present

## 2022-01-04 DIAGNOSIS — I444 Left anterior fascicular block: Secondary | ICD-10-CM | POA: Diagnosis present

## 2022-01-04 DIAGNOSIS — G44319 Acute post-traumatic headache, not intractable: Secondary | ICD-10-CM | POA: Diagnosis not present

## 2022-01-04 DIAGNOSIS — E876 Hypokalemia: Secondary | ICD-10-CM | POA: Diagnosis not present

## 2022-01-04 LAB — COMPREHENSIVE METABOLIC PANEL
ALT: 11 U/L (ref 0–44)
AST: 18 U/L (ref 15–41)
Albumin: 3.4 g/dL — ABNORMAL LOW (ref 3.5–5.0)
Alkaline Phosphatase: 52 U/L (ref 38–126)
Anion gap: 12 (ref 5–15)
BUN: 25 mg/dL — ABNORMAL HIGH (ref 8–23)
CO2: 21 mmol/L — ABNORMAL LOW (ref 22–32)
Calcium: 9.1 mg/dL (ref 8.9–10.3)
Chloride: 107 mmol/L (ref 98–111)
Creatinine, Ser: 0.67 mg/dL (ref 0.44–1.00)
GFR, Estimated: >60 mL/min (ref >60)
Glucose, Bld: 103 mg/dL — ABNORMAL HIGH (ref 70–99)
Potassium: 3.8 mmol/L (ref 3.5–5.1)
Sodium: 140 mmol/L (ref 135–145)
Total Bilirubin: 1.1 mg/dL (ref 0.3–1.2)
Total Protein: 5.7 g/dL — ABNORMAL LOW (ref 6.5–8.1)

## 2022-01-04 LAB — URINALYSIS, ROUTINE W REFLEX MICROSCOPIC
Bilirubin Urine: NEGATIVE
Glucose, UA: NEGATIVE mg/dL
Hgb urine dipstick: NEGATIVE
Ketones, ur: 5 mg/dL — AB
Nitrite: POSITIVE — AB
Protein, ur: NEGATIVE mg/dL
Specific Gravity, Urine: 1.02 (ref 1.005–1.030)
pH: 5 (ref 5.0–8.0)

## 2022-01-04 LAB — CBC
HCT: 40.2 % (ref 36.0–46.0)
Hemoglobin: 12.8 g/dL (ref 12.0–15.0)
MCH: 27.6 pg (ref 26.0–34.0)
MCHC: 31.8 g/dL (ref 30.0–36.0)
MCV: 86.8 fL (ref 80.0–100.0)
Platelets: 336 10*3/uL (ref 150–400)
RBC: 4.63 MIL/uL (ref 3.87–5.11)
RDW: 14.6 % (ref 11.5–15.5)
WBC: 10.5 10*3/uL (ref 4.0–10.5)
nRBC: 0 % (ref 0.0–0.2)

## 2022-01-04 MED ORDER — FAMOTIDINE 20 MG PO TABS
20.0000 mg | ORAL_TABLET | Freq: Every day | ORAL | Status: DC
  Administered 2022-01-04 – 2022-01-10 (×7): 20 mg via ORAL
  Filled 2022-01-04 (×7): qty 1

## 2022-01-04 MED ORDER — SODIUM CHLORIDE 0.9 % IV SOLN
2.0000 g | INTRAVENOUS | Status: DC
  Administered 2022-01-04 – 2022-01-07 (×4): 2 g via INTRAVENOUS
  Filled 2022-01-04 (×4): qty 20

## 2022-01-04 MED ORDER — AMLODIPINE BESYLATE 10 MG PO TABS
10.0000 mg | ORAL_TABLET | Freq: Every day | ORAL | Status: DC
  Administered 2022-01-04 – 2022-01-10 (×7): 10 mg via ORAL
  Filled 2022-01-04 (×5): qty 1
  Filled 2022-01-04 (×2): qty 2

## 2022-01-04 MED ORDER — PANTOPRAZOLE SODIUM 40 MG PO TBEC
40.0000 mg | DELAYED_RELEASE_TABLET | Freq: Two times a day (BID) | ORAL | Status: DC
  Administered 2022-01-04 – 2022-01-10 (×12): 40 mg via ORAL
  Filled 2022-01-04 (×12): qty 1

## 2022-01-04 MED ORDER — IRBESARTAN 300 MG PO TABS
300.0000 mg | ORAL_TABLET | Freq: Every day | ORAL | Status: DC
  Administered 2022-01-04 – 2022-01-10 (×7): 300 mg via ORAL
  Filled 2022-01-04 (×7): qty 1

## 2022-01-04 MED ORDER — HYDRALAZINE HCL 10 MG PO TABS
10.0000 mg | ORAL_TABLET | Freq: Three times a day (TID) | ORAL | Status: DC
  Administered 2022-01-04 – 2022-01-09 (×13): 10 mg via ORAL
  Filled 2022-01-04 (×18): qty 1

## 2022-01-04 MED ORDER — LORAZEPAM 2 MG/ML IJ SOLN
1.0000 mg | Freq: Once | INTRAMUSCULAR | Status: AC
  Administered 2022-01-04: 1 mg via INTRAVENOUS
  Filled 2022-01-04: qty 1

## 2022-01-04 NOTE — Assessment & Plan Note (Signed)
Patient not on statin therapy

## 2022-01-04 NOTE — ED Notes (Signed)
Pt ate 25% of dinner.

## 2022-01-04 NOTE — Assessment & Plan Note (Signed)
Left sided sciatica  Plan to continue pain control, PT and OT,  Follow up as outpatient

## 2022-01-04 NOTE — Progress Notes (Signed)
Urine analysis returned positive for pyuria.  The patient is still confused to provide a reliable history.  Urine culture ordered and started on Rocephin empirically for presumptive UTI.  Follow urine culture for ID and sensitivities.  De-escalate antibiotics as able.  Time spent 15 minutes

## 2022-01-04 NOTE — Assessment & Plan Note (Signed)
Continue as needed analgesics, acetaminophen and oxycodone, Pt and Ot.

## 2022-01-04 NOTE — Progress Notes (Signed)
  Progress Note   Patient: Sherry [name] KZS:[id number] DOB: 03-02-1930 DOA: 01/03/2022     0 DOS: the patient was seen and examined on 01/04/2022   Brief hospital course: Mrs. Lindler was admitted to the hospital with the working diagnosis of acute metabolic encephalopathy.   88 yo female with the past medical history of hypertension, who was recently seen in the ED for a mechanical fall with head and neck trauma. C1 close fracture. Patient was discharged home where her condition continue to deteriorate with worsening confusion, prompting her family to bring her back to the hospital.  On her initial physical examination her blood pressure was 187/84, HR 110 RR 24 and 02 saturation 97%, patient was obtunded, delirious and irritable, lungs with no wheezing or rales, heart with S1 and S2 present and rhythmic, abdomen with no distention, and no lower extremity edema.   Na 136, K 4,2 Cl 98 bicarbonate 21, glucose 127 bun 23 cr 0.74 Wbc 7,8 hgb 14.0  plt 344  Head CT with no acute intracranial changes, decreasing right frontal scalp hematoma. No acute maxillofacial bone fracture   Chest radiograph with no cardiomegaly, no infiltrates or effusions.  EKG 81 bpm, left axis deviation, left anterior fascicular block, qtc not prolonged, sinus rhythm with no significant ST segment or T wave changes.   Follow up Brain MRI with no acute changes.   Patient was admitted for neuro checks and supportive medical therapy.      Assessment and Plan: * Acute metabolic encephalopathy Head concussion  Patient not at her baseline continue disorientated and confuses.  No agitation  Plan to continue supportive medical care with IV fluids, as needed analgesics Will need further neuro checks per unit protocol PT and OT  Essential hypertension Continue blood pressure control with hydralazine, ibersartan and amlodipine Continue close blood pressure monitoring  Hyperlipidemia Patient not on statin  therapy  Closed C1 fracture (HCC) Continue as needed analgesics, acetaminophen and oxycodone, Pt and Ot.   Anxiety Patient had lorazepam on admission Will try to avoid benzodiazepines that can trigger worsening encephalopathy.   Spinal stenosis at L4-L5 level Left sided sciatica  Plan to continue pain control, PT and OT,  Follow up as outpatient        Subjective: Patient continue to be confused and disorientated, not agitated   Physical Exam: Vitals:   01/04/22 1215 01/04/22 1300 01/04/22 1330 01/04/22 1349  BP: (!) 167/63 (!) 162/69 (!) 155/67   Pulse: 84 86 83   Resp: (!) 21 (!) 28 (!) 25   Temp:    98.1 F (36.7 C)  TempSrc:    Oral  SpO2: 94% 95% 96%    Neurology awake and alert, she responds to questions and follows commands, positive confusion and disorientation  ENT with mild pallor, positive ecchymosis periorbital Cardiovascular with S1 and S2 present and rhythmic with no gallops Respiratory with no rales or wheezing  Abdomen with no distention  No lower extremity edema  Data Reviewed:    Family Communication: I spoke with patient's husband at the bedside, we talked in detail about patient's condition, plan of care and prognosis and all questions were addressed.   Disposition: Status is: Observation The patient remains OBS appropriate and will d/c before 2 midnights.  Planned Discharge Destination: Home possible SNF      Author: Tawni Millers, MD 01/04/2022 3:12 PM  For on call review [email or web address].

## 2022-01-04 NOTE — Assessment & Plan Note (Addendum)
Head concussion  Patient not at her baseline continue disorientated and confuses.  No agitation  Plan to continue supportive medical care with IV fluids, as needed analgesics Will need further neuro checks per unit protocol PT and OT

## 2022-01-04 NOTE — ED Notes (Signed)
Stretcher linens applied and new adult diaper applied, warm blankets given.

## 2022-01-04 NOTE — ED Notes (Signed)
Patient transported to MRI 

## 2022-01-04 NOTE — Assessment & Plan Note (Addendum)
Patient had lorazepam on admission Will try to avoid benzodiazepines that can trigger worsening encephalopathy.

## 2022-01-04 NOTE — ED Notes (Signed)
Pt resting quietly at this time. Husband at bedside but is going to step outside.

## 2022-01-04 NOTE — Assessment & Plan Note (Signed)
Continue blood pressure control with hydralazine, ibersartan and amlodipine Continue close blood pressure monitoring

## 2022-01-04 NOTE — Hospital Course (Signed)
Mrs. Kitchens was admitted to the hospital with the working diagnosis of acute metabolic encephalopathy.   87 yo female with the past medical history of hypertension, who was recently seen in the ED for a mechanical fall with head and neck trauma. C1 close fracture. Patient was discharged home where her condition continue to deteriorate with worsening confusion, prompting her family to bring her back to the hospital.  On her initial physical examination her blood pressure was 187/84, HR 110 RR 24 and 02 saturation 97%, patient was obtunded, delirious and irritable, lungs with no wheezing or rales, heart with S1 and S2 present and rhythmic, abdomen with no distention, and no lower extremity edema.   Na 136, K 4,2 Cl 98 bicarbonate 21, glucose 127 bun 23 cr 0.74 Wbc 7,8 hgb 14.0  plt 344  Head CT with no acute intracranial changes, decreasing right frontal scalp hematoma. No acute maxillofacial bone fracture   Chest radiograph with no cardiomegaly, no infiltrates or effusions.  EKG 81 bpm, left axis deviation, left anterior fascicular block, qtc not prolonged, sinus rhythm with no significant ST segment or T wave changes.   Follow up Brain MRI with no acute changes.   Patient was admitted for neuro checks and supportive medical therapy.   11/06 urine analysis positive for pyuria, patient started on antibiotic therapy with ceftriaxone.

## 2022-01-04 NOTE — ED Notes (Signed)
MD and bedside to update pt and husband.

## 2022-01-05 DIAGNOSIS — E785 Hyperlipidemia, unspecified: Secondary | ICD-10-CM | POA: Diagnosis not present

## 2022-01-05 DIAGNOSIS — I10 Essential (primary) hypertension: Secondary | ICD-10-CM | POA: Diagnosis not present

## 2022-01-05 DIAGNOSIS — G9341 Metabolic encephalopathy: Secondary | ICD-10-CM | POA: Diagnosis not present

## 2022-01-05 DIAGNOSIS — M48061 Spinal stenosis, lumbar region without neurogenic claudication: Secondary | ICD-10-CM | POA: Diagnosis not present

## 2022-01-05 DIAGNOSIS — N390 Urinary tract infection, site not specified: Secondary | ICD-10-CM | POA: Diagnosis present

## 2022-01-05 NOTE — ED Notes (Signed)
Husband at bedside.  

## 2022-01-05 NOTE — ED Notes (Signed)
Sitter at bedside.

## 2022-01-05 NOTE — ED Notes (Signed)
Pt has bruising bilateral eyes from fall.

## 2022-01-05 NOTE — Assessment & Plan Note (Signed)
Positive pyuria, plan to continue antibiotic therapy with ceftriaxone.

## 2022-01-05 NOTE — ED Notes (Signed)
Staffing called about getting sitter for pt as this patient is altered and pulling at lines.  Reggie in staffing stated he currently does not have a sitter to send me at this time.  On queue to get safety sitter when one available.

## 2022-01-05 NOTE — ED Notes (Signed)
Called for the purple man

## 2022-01-05 NOTE — Progress Notes (Signed)
Progress Note   Patient: Sherry [name] EPP:[id number] DOB: [birth year] DOA: 01/03/2022     1 DOS: the patient was seen and examined on 01/05/2022   Brief hospital course: Mrs. Haran was admitted to the hospital with the working diagnosis of acute metabolic encephalopathy.   88 yo female with the past medical history of hypertension, who was recently seen in the ED for a mechanical fall with head and neck trauma. C1 close fracture. Patient was discharged home where her condition continue to deteriorate with worsening confusion, prompting her family to bring her back to the hospital.  On her initial physical examination her blood pressure was 187/84, HR 110 RR 24 and 02 saturation 97%, patient was obtunded, delirious and irritable, lungs with no wheezing or rales, heart with S1 and S2 present and rhythmic, abdomen with no distention, and no lower extremity edema.   Na 136, K 4,2 Cl 98 bicarbonate 21, glucose 127 bun 23 cr 0.74 Wbc 7,8 hgb 14.0  plt 344  Head CT with no acute intracranial changes, decreasing right frontal scalp hematoma. No acute maxillofacial bone fracture   Chest radiograph with no cardiomegaly, no infiltrates or effusions.  EKG 81 bpm, left axis deviation, left anterior fascicular block, qtc not prolonged, sinus rhythm with no significant ST segment or T wave changes.   Follow up Brain MRI with no acute changes.   Patient was admitted for neuro checks and supportive medical therapy.   11/06 urine analysis positive for pyuria, patient started on antibiotic therapy with ceftriaxone.     Assessment and Plan: * Acute metabolic encephalopathy Head concussion  Her mentation has improved, but not yet back to baseline.   Continue supportive medical care with IV fluids, and as needed analgesics Follow up with PT and OT Continue neuro checks per unit protocol.   Essential hypertension Blood pressure control with hydralazine, ibersartan and amlodipine Diastolic blood  pressure has been elevated, continue conservative close blood pressure monitoring and pain control for cervical fracture.  Continue close blood pressure monitoring  Hyperlipidemia Patient not on statin therapy  Closed C1 fracture ([place]) Continue as needed analgesics, acetaminophen and oxycodone, Pt and Ot.   Anxiety Avoid benzodiazepines for now to prevent delirium Continue close monitoring   Spinal stenosis at L4-L5 level Left sided sciatica  Plan to continue pain control, PT and OT,  Follow up as outpatient  UTI (urinary tract infection) Positive pyuria, plan to continue antibiotic therapy with ceftriaxone.         Subjective: Patient more responsive, continue to have disorientation but no agitation, positive neck pain. Patient still in the ED at the time of my examination, pending her transfer to to medical ward.   Physical Exam: Vitals:   01/05/22 1000 01/05/22 1114 01/05/22 1245 01/05/22 1301  BP: (!) 118/102  (!) 174/93 (!) 152/95  Pulse: 79  80 79  Resp: '18  18 15  '$ Temp:  98 F (36.7 C)  97.7 F (36.5 C)  TempSrc:  Oral  Oral  SpO2: 96%  98% 97%   Neurology awake and alert ENT with mild pallor, biorbital ecchymosis Cardiovascular with S1 and S2 present and rhythmic Respiratory with no rales or wheezing Abdomen with no distention  No lower extremity edema  Data Reviewed:    Family Communication: no family a the bedside.  Disposition: Status is: Inpatient Remains inpatient appropriate because: acute metabolic encephalopathy   Planned Discharge Destination: Skilled nursing facility    Author: Tawni Millers, MD 01/05/2022 1:12  PM  For on call review [email or web address].

## 2022-01-06 DIAGNOSIS — S060X0A Concussion without loss of consciousness, initial encounter: Secondary | ICD-10-CM | POA: Diagnosis not present

## 2022-01-06 DIAGNOSIS — G44319 Acute post-traumatic headache, not intractable: Secondary | ICD-10-CM | POA: Diagnosis not present

## 2022-01-06 DIAGNOSIS — G9341 Metabolic encephalopathy: Secondary | ICD-10-CM | POA: Diagnosis not present

## 2022-01-06 LAB — CBC
HCT: 38.1 % (ref 36.0–46.0)
Hemoglobin: 12.5 g/dL (ref 12.0–15.0)
MCH: 28 pg (ref 26.0–34.0)
MCHC: 32.8 g/dL (ref 30.0–36.0)
MCV: 85.4 fL (ref 80.0–100.0)
Platelets: 282 10*3/uL (ref 150–400)
RBC: 4.46 MIL/uL (ref 3.87–5.11)
RDW: 14.7 % (ref 11.5–15.5)
WBC: 8.4 10*3/uL (ref 4.0–10.5)
nRBC: 0 % (ref 0.0–0.2)

## 2022-01-06 LAB — BASIC METABOLIC PANEL
Anion gap: 9 (ref 5–15)
BUN: 21 mg/dL (ref 8–23)
CO2: 24 mmol/L (ref 22–32)
Calcium: 9.5 mg/dL (ref 8.9–10.3)
Chloride: 104 mmol/L (ref 98–111)
Creatinine, Ser: 0.58 mg/dL (ref 0.44–1.00)
GFR, Estimated: >60 mL/min (ref >60)
Glucose, Bld: 101 mg/dL — ABNORMAL HIGH (ref 70–99)
Potassium: 3.5 mmol/L (ref 3.5–5.1)
Sodium: 137 mmol/L (ref 135–145)

## 2022-01-06 MED ORDER — POTASSIUM CHLORIDE CRYS ER 20 MEQ PO TBCR
40.0000 meq | EXTENDED_RELEASE_TABLET | Freq: Once | ORAL | Status: AC
  Administered 2022-01-06: 40 meq via ORAL
  Filled 2022-01-06 (×2): qty 2

## 2022-01-06 MED ORDER — ACETAMINOPHEN 325 MG PO TABS
650.0000 mg | ORAL_TABLET | Freq: Three times a day (TID) | ORAL | Status: DC
  Administered 2022-01-06 – 2022-01-08 (×7): 650 mg via ORAL
  Filled 2022-01-06 (×7): qty 2

## 2022-01-06 MED ORDER — TRAMADOL HCL 50 MG PO TABS
50.0000 mg | ORAL_TABLET | Freq: Four times a day (QID) | ORAL | Status: DC | PRN
  Administered 2022-01-07 – 2022-01-08 (×2): 50 mg via ORAL
  Filled 2022-01-06 (×2): qty 1

## 2022-01-06 MED ORDER — ACETAMINOPHEN 325 MG PO TABS
650.0000 mg | ORAL_TABLET | Freq: Four times a day (QID) | ORAL | Status: DC | PRN
  Administered 2022-01-08: 650 mg via ORAL
  Filled 2022-01-06: qty 2

## 2022-01-06 NOTE — Progress Notes (Signed)
Request from attending to give Tramadol for pain, but patient is stating she is in severe pain. PRN med for severe pain given at this tim per patient request.

## 2022-01-06 NOTE — Progress Notes (Signed)
[facility] PROGRESS NOTE   Sherry [name] DGL:[id number] DOB: [birth year] DOA: 01/03/2022  PCP: Rusty Aus, MD  Brief History/Interval Summary: 88 yo female with the past medical history of hypertension, who was recently seen in the ED for a mechanical fall with head and neck trauma. C1 close fracture. Patient was discharged home where her condition continue to deteriorate with worsening confusion, prompting her family to bring her back to the hospital. Head CT with no acute intracranial changes, decreasing right frontal scalp hematoma. No acute maxillofacial bone fracture. Chest radiograph with no cardiomegaly, no infiltrates or effusions. Follow up Brain MRI with no acute changes.  UA raised concern for UTI.  Patient was hospitalized for further management.  Consultants: None  Procedures: None    Subjective/Interval History: Patient complains of headache.  Denies any neck pain.  No chest pain shortness of breath.  No nausea or vomiting.    Assessment/Plan:  Acute metabolic encephalopathy Probably secondary to fall and concussion.  No focal neurological deficits appreciated. MRI brain did not show any acute findings.  He was to be improving.  Continue to monitor.  Concussion No injuries noted on imaging studies.  Patient does complain of headache.  We will give her scheduled Tylenol for now.  Continue to monitor.  Urinary tract infection UA noted to be abnormal.  Greater than 100,000 colonies of gram-negative bacteria growing on culture.  Await final identification and sensitivities.  Continue ceftriaxone.  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  No neurological deficits noted.  Pain control.  Essential hypertension Noted to be on amlodipine, hydralazine and Avapro.  Continue to monitor blood pressures closely.  Hyperlipidemia Not on statin therapy.  History of anxiety Avoiding benzodiazepine.  Spinal stenosis at  L4-L5 Has left-sided sciatica.  Stable.  PT and OT evaluation.   DVT Prophylaxis: SCDs Code Status: Full code Family Communication: Discussed with patient.  No family at bedside Disposition Plan: Await PT and OT evaluation.  Status is: Inpatient Remains inpatient appropriate because: Concussion, urinary tract infection      Medications: Scheduled:  amLODipine  10 mg Oral Daily   famotidine  20 mg Oral Daily   hydrALAZINE  10 mg Oral Q8H   irbesartan  300 mg Oral Daily   pantoprazole  40 mg Oral BID   Continuous:  cefTRIAXone (ROCEPHIN)  IV Stopped (01/05/22 2234)   dextrose 5 % and 0.45% NaCl Stopped (01/05/22 1800)   JJO:ACZYSAYTKZSWF (DILAUDID) injection, ondansetron **OR** ondansetron (ZOFRAN) IV  Antibiotics: Anti-infectives (From admission, onward)    Start     Dose/Rate Route Frequency Ordered Stop   01/04/22 2215  cefTRIAXone (ROCEPHIN) 2 g in sodium chloride 0.9 % 100 mL IVPB        2 g 200 mL/hr over 30 Minutes Intravenous Every 24 hours 01/04/22 2205         Objective:  Vital Signs  Vitals:   01/05/22 1919 01/06/22 0405 01/06/22 0635 01/06/22 0724  BP: (!) 162/63 (!) 165/73 (!) 170/69 (!) 169/72  Pulse: 85 87 75 79  Resp: '16 17 18 15  '$ Temp: 97.9 F (36.6 C) 97.8 F (36.6 C) 98.3 F (36.8 C) 98.7 F (37.1 C)  TempSrc: Oral Oral Oral Oral  SpO2: 98% 98% 98% 96%    Intake/Output Summary (Last 24 hours) at 01/06/2022 1012 Last data filed at 01/06/2022 0440 Gross per 24 hour  Intake 2200 ml  Output --  Net 2200 ml  There were no vitals filed for this visit.  General appearance: Awake alert.  In no distress Resp: Clear to auscultation bilaterally.  Normal effort Cardio: S1-S2 is normal regular.  No S3-S4.  No rubs murmurs or bruit GI: Abdomen is soft.  Nontender nondistended.  Bowel sounds are present normal.  No masses organomegaly Extremities: No edema.  Moving all of her extremities  Neurologic: Alert and oriented x3.  No focal  neurological deficits.    Lab Results:  Data Reviewed: I have personally reviewed following labs and reports of the imaging studies  CBC: Recent Labs  Lab 12/30/21 1411 01/03/22 1352 01/04/22 0440 01/06/22 0346  WBC 8.6 7.8 10.5 8.4  NEUTROABS  --  5.3  --   --   HGB 12.4 14.0 12.8 12.5  HCT 39.5 43.2 40.2 38.1  MCV 86.6 86.4 86.8 85.4  PLT 305 344 336 628    Basic Metabolic Panel: Recent Labs  Lab 12/30/21 1411 01/03/22 1352 01/04/22 0440 01/06/22 0346  NA 142 136 140 137  K 3.8 4.2 3.8 3.5  CL 107 98 107 104  CO2 25 21* 21* 24  GLUCOSE 120* 127* 103* 101*  BUN 34* 23 25* 21  CREATININE 0.75 0.74 0.67 0.58  CALCIUM 9.7 9.9 9.1 9.5    GFR: CrCl cannot be calculated (Unknown ideal weight.).  Liver Function Tests: Recent Labs  Lab 01/03/22 1352 01/04/22 0440  AST 20 18  ALT 14 11  ALKPHOS 60 52  BILITOT 1.1 1.1  PROT 6.2* 5.7*  ALBUMIN 3.8 3.4*     CBG: Recent Labs  Lab 12/30/21 1515  GLUCAP 100*     Recent Results (from the past 240 hour(s))  Urine Culture     Status: Abnormal (Preliminary result)   Collection Time: 01/04/22  3:36 PM   Specimen: Urine, Clean Catch  Result Value Ref Range Status   Specimen Description URINE, CLEAN CATCH  Final   Special Requests NONE  Final   Culture (A)  Final    >=100,000 COLONIES/mL GRAM NEGATIVE RODS SUSCEPTIBILITIES TO FOLLOW Performed at [facility], [place] [address] [place], [place] [address]    Report Status PENDING  Incomplete      Radiology Studies: No results found.     LOS: 2 days   [facility] Pager on [email or web address]  01/06/2022, 10:12 AM

## 2022-01-06 NOTE — Evaluation (Signed)
Physical Therapy Evaluation Patient Details Name: Sherry [name] MRN: [id number] DOB: 1930-04-27 Today's Date: 01/06/2022  History of Present Illness  Patient is a 88 yo female presenting to the ED status post fall on 12/30/21, MRI and CT clear, who was then discharged and re-presenting to ED on 01/03/22 with headache, vomiting, and AMS. Patient with history of closed C1 fracture and hypotension.  Clinical Impression  Patient participating in bed level evaluation and limited with activity tolerance due to pain "all over" and headache.  Spouse in the room as well reporting sciatic pain.  She is weak in extremities as well and will benefit from continued skilled PT in the acute setting and hopeful for progression when pain controlled to allow d/c home with spouse to assist and follow up HHPT.        Recommendations for follow up therapy are one component of a multi-disciplinary discharge planning process, led by the attending physician.  Recommendations may be updated based on patient status, additional functional criteria and insurance authorization.  Follow Up Recommendations Home health PT      Assistance Recommended at Discharge Frequent or constant Supervision/Assistance  Patient can return home with the following  Assistance with cooking/housework;Assist for transportation;Help with stairs or ramp for entrance;A little help with bathing/dressing/bathroom;A little help with walking and/or transfers    Equipment Recommendations Rolling walker (2 wheels)  Recommendations for Other Services       Functional Status Assessment Patient has had a recent decline in their functional status and demonstrates the ability to make significant improvements in function in a reasonable and predictable amount of time.     Precautions / Restrictions Precautions Precautions: Fall Restrictions Weight Bearing Restrictions: No      Mobility  Bed Mobility Overal bed mobility: Needs Assistance              General bed mobility comments: patient refusing to attempt OOB movement, becoming labile and stating "i just cant do it, please can we wait" and citing pain in headache then than stating she had pain "everywhere" however could not specifiy further    Transfers                        Ambulation/Gait                  Stairs            Wheelchair Mobility    Modified Rankin (Stroke Patients Only)       Balance                                             Pertinent Vitals/Pain Pain Assessment Pain Assessment: Faces Faces Pain Scale: Hurts worst Pain Location: head and "everywhere" Pain Descriptors / Indicators: Discomfort, Grimacing, Guarding Pain Intervention(s): Monitored during session, Repositioned, Premedicated before session    Home Living Family/patient expects to be discharged to:: Private residence Living Arrangements: Spouse/significant other Available Help at Discharge: Family;Available 24 hours/day Type of Home: House Home Access: Level entry       Home Layout: One level Home Equipment: Conservation officer, nature (2 wheels);Shower seat;Grab bars - tub/shower      Prior Function Prior Level of Function : Independent/Modified Independent             Mobility Comments: uses RW for mobility ADLs Comments: cooks in kitchen sometimes  no walker; was driving     Hand Dominance   Dominant Hand: Right    Extremity/Trunk Assessment   Upper Extremity Assessment Upper Extremity Assessment: Defer to OT evaluation    Lower Extremity Assessment Lower Extremity Assessment: RLE deficits/detail;LLE deficits/detail RLE Deficits / Details: AAROM limited by pain with edema and bruising on knees, strength knee extension 4-/5, hip flexion 3+/5 RLE: Unable to fully assess due to pain LLE Deficits / Details: AAROM limited by pain with edema and bruising on knees, strength knee extension 4-/5, hip flexion 3+/5 LLE: Unable to  fully assess due to pain       Communication   Communication: No difficulties  Cognition Arousal/Alertness: Lethargic Behavior During Therapy: Flat affect Overall Cognitive Status: Impaired/Different from baseline Area of Impairment: Orientation, Attention, Memory, Following commands, Safety/judgement, Awareness, Problem solving                 Orientation Level: Place, Time, Situation   Memory: Decreased recall of precautions, Decreased short-term memory Following Commands: Follows one step commands inconsistently Safety/Judgement: Decreased awareness of safety, Decreased awareness of deficits Awareness: Intellectual Problem Solving: Slow processing, Decreased initiation, Difficulty sequencing, Requires verbal cues, Requires tactile cues General Comments: Patient lethargic and moaning, HOH impairing cognition and requiring increased time for all questions posed        General Comments General comments (skin integrity, edema, etc.): spouse present in the room and wanting to speak to MD about pt's pain; gave home set up information and prior level of function    Exercises     Assessment/Plan    PT Assessment Patient needs continued PT services  PT Problem List Decreased strength;Decreased cognition;Decreased knowledge of precautions;Decreased mobility;Decreased range of motion;Decreased activity tolerance;Decreased safety awareness;Decreased balance;Decreased knowledge of use of DME       PT Treatment Interventions DME instruction;Functional mobility training;Balance training;Patient/family education;Therapeutic activities;Therapeutic exercise;Gait training;Stair training;Cognitive remediation;Neuromuscular re-education    PT Goals (Current goals can be found in the Care Plan section)  Acute Rehab PT Goals Patient Stated Goal: to help pain PT Goal Formulation: With patient/family Time For Goal Achievement: 01/20/22 Potential to Achieve Goals: Good    Frequency Min  3X/week     Co-evaluation PT/OT/SLP Co-Evaluation/Treatment: Yes Reason for Co-Treatment: For patient/therapist safety;Other (comment) (due to pain) PT goals addressed during session: Strengthening/ROM         AM-PAC PT "6 Clicks" Mobility  Outcome Measure Help needed turning from your back to your side while in a flat bed without using bedrails?: A Lot Help needed moving from lying on your back to sitting on the side of a flat bed without using bedrails?: Total Help needed moving to and from a bed to a chair (including a wheelchair)?: Total Help needed standing up from a chair using your arms (e.g., wheelchair or bedside chair)?: Total Help needed to walk in hospital room?: Total Help needed climbing 3-5 steps with a railing? : Total 6 Click Score: 7    End of Session   Activity Tolerance: Patient limited by pain Patient left: in bed;with call bell/phone within reach;with family/visitor present   PT Visit Diagnosis: Other abnormalities of gait and mobility (R26.89);Muscle weakness (generalized) (M62.81);Other symptoms and signs involving the nervous system (R29.898);Pain    Time: 7893-8101 PT Time Calculation (min) (ACUTE ONLY): 15 min   Charges:   PT Evaluation $PT Eval Moderate Complexity: 1 Mod          Cyndi Kaseem Vastine, PT Acute Rehabilitation Services [phone] 01/06/2022   Reginia Naas  01/06/2022, 2:28 PM

## 2022-01-06 NOTE — Progress Notes (Signed)
Pt bladder scan showed 374 mls. Called MD on called [facility]. Ok'd to perform I&O cath. Retrieved 900 cc from pt bladder via straight cath. Pt tolerated well.

## 2022-01-06 NOTE — Evaluation (Signed)
Occupational Therapy Evaluation Patient Details Name: Sherry [name] MRN: [id number] DOB: 25-Dec-1930 Today's Date: 01/06/2022   History of Present Illness Patient is a 88 yo female presenting to the ED status post fall on 12/30/21, MRI and CT clear, who was then discharged and re-presenting to ED on 01/03/22 with headache, vomiting, and AMS. Patient with history of closed C1 fracture and hypotension.   Clinical Impression   Prior to this admission, patient living at home with her husband, walking with a RW for longer distances, independent with ADLs, and driving. Currently, patient presenting with lethargy, significant pain, and need for increased assist for all apsects of care. Patient refusing to attempt OOB movement, becoming labile and stating "I just cant do it, please can we wait" and citing pain in headache then than stating she had pain "everywhere" however could not specifiy further. OT recommending HHOT at this time, however may need short term SNF placement if patient does not progress.      Recommendations for follow up therapy are one component of a multi-disciplinary discharge planning process, led by the attending physician.  Recommendations may be updated based on patient status, additional functional criteria and insurance authorization.   Follow Up Recommendations  Home health OT    Assistance Recommended at Discharge Frequent or constant Supervision/Assistance  Patient can return home with the following A lot of help with walking and/or transfers;A lot of help with bathing/dressing/bathroom;Assistance with cooking/housework;Assistance with feeding;Direct supervision/assist for medications management;Direct supervision/assist for financial management;Assist for transportation;Help with stairs or ramp for entrance    Functional Status Assessment  Patient has had a recent decline in their functional status and demonstrates the ability to make significant improvements in  function in a reasonable and predictable amount of time.  Equipment Recommendations  None recommended by OT (patient has DME needed)    Recommendations for Other Services       Precautions / Restrictions Precautions Precautions: Fall Restrictions Weight Bearing Restrictions: No      Mobility Bed Mobility Overal bed mobility: Needs Assistance             General bed mobility comments: patient refusing to attempt OOB movement, becoming labile and stating "i just cant do it, please can we wait" and citing pain in headache then than stating she had pain "everywhere" however could not specifiy further    Transfers                   General transfer comment: patient refusing to attempt OOB movement, becoming labile and stating "i just cant do it, please can we wait" and citing pain in headache then than stating she had pain "everywhere" however could not specifiy further      Balance                                           ADL either performed or assessed with clinical judgement   ADL Overall ADL's : Needs assistance/impaired Eating/Feeding: Set up;Sitting   Grooming: Set up;Bed level   Upper Body Bathing: Moderate assistance;Bed level   Lower Body Bathing: Maximal assistance;Total assistance;Bed level   Upper Body Dressing : Moderate assistance;Bed level   Lower Body Dressing: Maximal assistance;Total assistance;Bed level     Toilet Transfer Details (indicate cue type and reason): did not assess due patient refusal         Functional mobility during  ADLs: Moderate assistance;+2 for physical assistance;+2 for safety/equipment General ADL Comments: Patient presenting with lethargy, significant pain, and need for increased assist for all apsects of care     Vision Baseline Vision/History: 1 Wears glasses (readers) Ability to See in Adequate Light: 0 Adequate Patient Visual Report: Other (comment) (to be assessed further, patient not  keeping eyes open long enough to complete full assessment)       Perception     Praxis      Pertinent Vitals/Pain Pain Assessment Pain Assessment: Faces Faces Pain Scale: Hurts worst Pain Location: head and "everywhere" Pain Descriptors / Indicators: Discomfort, Grimacing, Guarding Pain Intervention(s): Limited activity within patient's tolerance, Monitored during session, Premedicated before session     Hand Dominance Right   Extremity/Trunk Assessment Upper Extremity Assessment Upper Extremity Assessment: Generalized weakness   Lower Extremity Assessment Lower Extremity Assessment: Defer to PT evaluation;Generalized weakness       Communication Communication Communication: No difficulties   Cognition Arousal/Alertness: Lethargic Behavior During Therapy: Flat affect Overall Cognitive Status: Impaired/Different from baseline Area of Impairment: Orientation, Attention, Memory, Following commands, Safety/judgement, Awareness, Problem solving                 Orientation Level: Place, Time, Situation Current Attention Level: Focused Memory: Decreased recall of precautions, Decreased short-term memory Following Commands: Follows one step commands inconsistently Safety/Judgement: Decreased awareness of safety, Decreased awareness of deficits Awareness: Intellectual Problem Solving: Slow processing, Decreased initiation, Difficulty sequencing, Requires verbal cues, Requires tactile cues General Comments: Patient lethargic and moaning, HOH impairing cognition and requiring increased time for all questions posed     General Comments       Exercises     Shoulder Instructions      Home Living Family/patient expects to be discharged to:: Private residence Living Arrangements: Spouse/significant other Available Help at Discharge: Family;Available 24 hours/day Type of Home: House Home Access: Level entry     Home Layout: One level     Bathroom Shower/Tub:  Walk-in shower         Home Equipment: Conservation officer, nature (2 wheels);Shower seat;Grab bars - tub/shower          Prior Functioning/Environment Prior Level of Function : Independent/Modified Independent             Mobility Comments: uses RW for mobility ADLs Comments: cooks in kitchen sometimes no walker; was driving        OT Problem List: Decreased strength;Decreased activity tolerance;Pain;Decreased cognition;Decreased coordination;Impaired balance (sitting and/or standing);Decreased safety awareness;Decreased knowledge of use of DME or AE;Decreased knowledge of precautions      OT Treatment/Interventions: Self-care/ADL training;Therapeutic exercise;Energy conservation;DME and/or AE instruction;Manual therapy;Modalities;Cognitive remediation/compensation;Patient/family education;Balance training    OT Goals(Current goals can be found in the care plan section) Acute Rehab OT Goals Patient Stated Goal: to be in less pain OT Goal Formulation: With patient Time For Goal Achievement: 01/20/22 Potential to Achieve Goals: Fair ADL Goals Pt Will Perform Lower Body Bathing: with min assist;sit to/from stand;sitting/lateral leans Pt Will Perform Lower Body Dressing: with min assist;sitting/lateral leans;sit to/from stand Pt Will Transfer to Toilet: with min assist;ambulating;regular height toilet;grab bars Pt Will Perform Toileting - Clothing Manipulation and hygiene: with min guard assist  OT Frequency: Min 2X/week    Co-evaluation              AM-PAC OT "6 Clicks" Daily Activity     Outcome Measure Help from another person eating meals?: A Little Help from another person taking care of personal grooming?:  A Little Help from another person toileting, which includes using toliet, bedpan, or urinal?: A Lot Help from another person bathing (including washing, rinsing, drying)?: A Lot Help from another person to put on and taking off regular upper body clothing?: A  Little Help from another person to put on and taking off regular lower body clothing?: A Lot 6 Click Score: 15   End of Session Nurse Communication: Mobility status  Activity Tolerance: Patient limited by fatigue;Patient limited by lethargy;Patient limited by pain Patient left: in bed;with call bell/phone within reach;with bed alarm set;with family/visitor present  OT Visit Diagnosis: Unsteadiness on feet (R26.81);Other abnormalities of gait and mobility (R26.89);Muscle weakness (generalized) (M62.81);Repeated falls (R29.6);History of falling (Z91.81);Pain Pain - part of body:  (Head)                Time: 9735-3299 OT Time Calculation (min): 13 min Charges:  OT General Charges $OT Visit: 1 Visit OT Evaluation $OT Eval Low Complexity: 1 Low  Corinne Ports E. Rishith Siddoway, OTR/L Acute Rehabilitation Services [phone]   Ascencion Dike 01/06/2022, 1:11 PM

## 2022-01-07 ENCOUNTER — Encounter (HOSPITAL_COMMUNITY): Payer: Self-pay | Admitting: Internal Medicine

## 2022-01-07 DIAGNOSIS — S060X0A Concussion without loss of consciousness, initial encounter: Secondary | ICD-10-CM | POA: Diagnosis not present

## 2022-01-07 DIAGNOSIS — G9341 Metabolic encephalopathy: Secondary | ICD-10-CM | POA: Diagnosis not present

## 2022-01-07 DIAGNOSIS — G44319 Acute post-traumatic headache, not intractable: Secondary | ICD-10-CM | POA: Diagnosis not present

## 2022-01-07 LAB — URINE CULTURE: Culture: >=100000 — AB

## 2022-01-07 MED ORDER — BUTALBITAL-APAP-CAFFEINE 50-325-40 MG PO TABS
2.0000 | ORAL_TABLET | Freq: Once | ORAL | Status: AC
  Administered 2022-01-07: 2 via ORAL
  Filled 2022-01-07: qty 2

## 2022-01-07 MED ORDER — HYDROMORPHONE HCL 1 MG/ML IJ SOLN: 0.5000 mg | INTRAMUSCULAR | Status: DC | PRN

## 2022-01-07 MED ORDER — BUTALBITAL-APAP-CAFFEINE 50-325-40 MG PO TABS
1.0000 | ORAL_TABLET | ORAL | Status: DC | PRN
  Administered 2022-01-07: 2 via ORAL
  Filled 2022-01-07: qty 2

## 2022-01-07 MED ORDER — SODIUM CHLORIDE 0.9 % IV SOLN
12.5000 mg | Freq: Once | INTRAVENOUS | Status: AC
  Administered 2022-01-07: 12.5 mg via INTRAVENOUS
  Filled 2022-01-07: qty 12.5

## 2022-01-07 NOTE — TOC Initial Note (Signed)
Transition of Care [facility]) - Initial/Assessment Note    Patient Details  Name: Sherry [name] MRN: [id number] Date of Birth: 08-09-1930  [facility]) CM/SW Contact:    Curlene Labrum, RN Phone Number: 01/07/2022, 2:50 PM  Clinical Narrative:                 CM attempted to meet with the patient - sleeping and patient's husband was not present in the room at this time.  I called and spoke with the patient's husband, Sherry [name] by phone and I plan to discuss transitions of care needs in the am.  The patient is not medically stable for discharge at this time and remains inpatient status for AMS after fall - HA after fall and post traumatic headache, pain, hypertension and UTI - being treated with IV antibiotics.  DME at the home include RW and 3:1 -   PCP - Dr. Emily Filbert  CM will follow up with the patient's husband in the am along with PT/OT progression to determine needs for home - home health versus SNF depending on mobility progression.  Expected Discharge Plan: [place] Barriers to Discharge: Continued Medical Work up   Patient Goals and CMS Choice Patient states their goals for this hospitalization and ongoing recovery are:: To get better - possible pending to home with husband if progresses well with PT/OT CMS Medicare.gov Compare Post Acute Care list provided to:: Patient Represenative (must comment) (Patient's husband) Choice offered to / list presented to : Spouse  Expected Discharge Plan and Services Expected Discharge Plan: [place]   Discharge Planning Services: CM Consult Post Acute Care Choice: [place] arrangements for the past 2 months: Single Family Home                                      Prior Living Arrangements/Services Living arrangements for the past 2 months: Single Family Home Lives with:: Spouse              Current home services: DME (Rw and 3:1 at home)     Activities of Daily Living Home Assistive Devices/Equipment: None ADL Screening (condition at time of admission) Patient's cognitive ability adequate to safely complete daily activities?: No Is the patient deaf or have difficulty hearing?: No Does the patient have difficulty seeing, even when wearing glasses/contacts?: No Does the patient have difficulty concentrating, remembering, or making decisions?: Yes Patient able to express need for assistance with ADLs?: No Does the patient have difficulty dressing or bathing?: Yes Independently performs ADLs?: No Communication: Independent Dressing (OT): Needs assistance Is this a change from baseline?: Change from baseline, expected to last >3 days Grooming: Needs assistance Is this a change from baseline?: Change from baseline, expected to last >3 days Feeding: Needs assistance Is this a change from baseline?: Change from baseline, expected to last >3 days Bathing: Needs assistance Is this a change from baseline?: Change from baseline, expected to last >3 days Toileting: Needs assistance Is this a change from baseline?: Change from baseline, expected to last >3days In/Out Bed: Needs assistance Is this a change from baseline?: Change from baseline, expected to last >3 days Walks in Home: Needs assistance Is this a change from baseline?: Change from baseline, expected to last >3 days Does the patient have difficulty walking or climbing stairs?: Yes Weakness of Legs: Both Weakness of  Arms/Hands: None  Permission Sought/Granted                  Emotional Assessment              Admission diagnosis:  Encephalopathy [G93.40] Hypertensive urgency [I16.0] Injury of head, subsequent encounter [S09.90XD] Altered mental status, unspecified altered mental status type [R41.82] Patient Active Problem List   Diagnosis Date Noted   UTI (urinary tract infection) 83/41/9622   Acute metabolic encephalopathy 29/79/8921   Encephalopathy  01/04/2022   Hypertensive urgency 01/03/2022   Back pain 05/29/2021   Left hip pain 05/28/2021   Hypokalemia 05/28/2021   Orthostatic hypotension 05/25/2021   Left sided sciatica 05/24/2021   Duodenitis 05/18/2021   Anemia of chronic disease 05/16/2021   Abdominal pain 05/15/2021   Thrombocytosis 05/15/2021   Essential hypertension 04/30/2021   Spinal stenosis at L4-L5 level 04/27/2021   Intractable low back pain 04/27/2021   Low back pain 04/26/2021   Closed C1 fracture ([place]) 02/12/2020   Neck fracture ([place]) 02/11/2020   Chest pain with moderate risk for cardiac etiology 05/05/2017   Shortness of breath 05/05/2017   Hyperlipidemia 09/25/2016   Neuropathy 09/25/2016   Statin intolerance 09/25/2016   Anxiety 09/25/2016   Palpitations 09/25/2016   PCP:  Rusty Aus, MD Pharmacy:   [facility] [place] [address] [place] [address] Phone: [phone] Fax: [phone]  [facility] [place], [place] [address] Phone: [phone] Fax: [phone]  [facility] [address] [place] [address] Phone: [phone] Fax: [phone]     Social Determinants of Health (SDOH) Interventions    Readmission Risk Interventions    01/07/2022    2:50 PM  Readmission Risk Prevention Plan  Post Dischage Appt Complete  Medication Screening Complete  Transportation Screening Complete

## 2022-01-07 NOTE — Progress Notes (Signed)
[facility] PROGRESS NOTE   Sherry [name] MHD:[id number] DOB: 08-May-1930 DOA: 01/03/2022  PCP: Rusty Aus, MD  Brief History/Interval Summary: 88 yo female with the past medical history of hypertension, who was recently seen in the ED for a mechanical fall with head and neck trauma. C1 close fracture. Patient was discharged home where her condition continue to deteriorate with worsening confusion, prompting her family to bring her back to the hospital. Head CT with no acute intracranial changes, decreasing right frontal scalp hematoma. No acute maxillofacial bone fracture. Chest radiograph with no cardiomegaly, no infiltrates or effusions. Follow up Brain MRI with no acute changes.  UA raised concern for UTI.  Patient was hospitalized for further management.  Consultants: None  Procedures: None    Subjective/Interval History: Patient continues to have significant headache.  Also feeling nauseated and vomited once this morning.  Denies any abdominal pain.  No chest pain or shortness of breath.      Assessment/Plan:  Acute metabolic encephalopathy Probably secondary to fall and concussion.  No focal neurological deficits appreciated. MRI brain did not show any acute findings.  Patient's mentation continues to improve.    Concussion with posttraumatic headache No injuries noted on imaging studies.  Patient continues to have significant headache.  She was started on Tylenol around-the-clock yesterday.  She was also ordered tramadol and Dilaudid.  She was given Dilaudid without any significant relief.  We will give her 2 doses of Fioricet this morning.  Phenergan x1 for nausea.  If symptoms do not improve will discuss with neurologist to further management.  No focal neurological deficits noted.  She is afebrile.   Urinary tract infection with E. coli UA noted to be abnormal.  Urine culture growing E. coli.  Sensitivities noted.  Continue ceftriaxone for now.   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  No neurological deficits noted.  Pain control.  Essential hypertension Noted to be on amlodipine, hydralazine and Avapro.  Blood pressure is high presumably secondary to pain.  Continue current medications for now.  Hyperlipidemia Not on statin therapy.  History of anxiety Avoiding benzodiazepine.  Spinal stenosis at L4-L5 Has left-sided sciatica.  Stable.  PT and OT evaluation.   DVT Prophylaxis: SCDs Code Status: Full code Family Communication: Discussed with patient.  No family at bedside Disposition Plan: Await PT and OT evaluation.  Status is: Inpatient Remains inpatient appropriate because: Concussion, urinary tract infection      Medications: Scheduled:  acetaminophen  650 mg Oral TID   amLODipine  10 mg Oral Daily   famotidine  20 mg Oral Daily   hydrALAZINE  10 mg Oral Q8H   irbesartan  300 mg Oral Daily   pantoprazole  40 mg Oral BID   Continuous:  cefTRIAXone (ROCEPHIN)  IV Stopped (01/06/22 2232)   dextrose 5 % and 0.45% NaCl 50 mL/hr at 01/07/22 0455   QJJ:HERDEYCXKGYJE, HYDROmorphone (DILAUDID) injection, ondansetron **OR** ondansetron (ZOFRAN) IV, traMADol  Antibiotics: Anti-infectives (From admission, onward)    Start     Dose/Rate Route Frequency Ordered Stop   01/04/22 2215  cefTRIAXone (ROCEPHIN) 2 g in sodium chloride 0.9 % 100 mL IVPB        2 g 200 mL/hr over 30 Minutes Intravenous Every 24 hours 01/04/22 2205         Objective:  Vital Signs  Vitals:   01/06/22 1802 01/06/22 1930 01/07/22 0207 01/07/22 0906  BP: (!) 167/57 Marland Kitchen)  186/52 (!) 168/55 (!) 185/79  Pulse: 73 72 62 82  Resp: '15 16 14 20  '$ Temp: 98.1 F (36.7 C) 98.7 F (37.1 C) 97.9 F (36.6 C) 97.8 F (36.6 C)  TempSrc: Oral Oral Oral Oral  SpO2:  98% 96% 99%    Intake/Output Summary (Last 24 hours) at 01/07/2022 0936 Last data filed at 01/07/2022 0543 Gross per 24 hour  Intake --  Output 1000 ml  Net  -1000 ml    There were no vitals filed for this visit.   General appearance: Awake alert.  In no distress.  Noted to be in significant discomfort. Bruising noted over the right facial area Resp: Clear to auscultation bilaterally.  Normal effort Cardio: S1-S2 is normal regular.  No S3-S4.  No rubs murmurs or bruit GI: Abdomen is soft.  Nontender nondistended.  Bowel sounds are present normal.  No masses organomegaly Extremities: No edema.  Full range of motion of lower extremities. Neurologic: Alert and oriented x3.  No focal neurological deficits.     Lab Results:  Data Reviewed: I have personally reviewed following labs and reports of the imaging studies  CBC: Recent Labs  Lab 01/03/22 1352 01/04/22 0440 01/06/22 0346  WBC 7.8 10.5 8.4  NEUTROABS 5.3  --   --   HGB 14.0 12.8 12.5  HCT 43.2 40.2 38.1  MCV 86.4 86.8 85.4  PLT 344 336 282     Basic Metabolic Panel: Recent Labs  Lab 01/03/22 1352 01/04/22 0440 01/06/22 0346  NA 136 140 137  K 4.2 3.8 3.5  CL 98 107 104  CO2 21* 21* 24  GLUCOSE 127* 103* 101*  BUN 23 25* 21  CREATININE 0.74 0.67 0.58  CALCIUM 9.9 9.1 9.5     GFR: CrCl cannot be calculated (Unknown ideal weight.).  Liver Function Tests: Recent Labs  Lab 01/03/22 1352 01/04/22 0440  AST 20 18  ALT 14 11  ALKPHOS 60 52  BILITOT 1.1 1.1  PROT 6.2* 5.7*  ALBUMIN 3.8 3.4*      Recent Results (from the past 240 hour(s))  Urine Culture     Status: Abnormal   Collection Time: 01/04/22  3:36 PM   Specimen: Urine, Clean Catch  Result Value Ref Range Status   Specimen Description URINE, CLEAN CATCH  Final   Special Requests   Final    NONE Performed at [facility], [place] [address], [place], [place] [address]    Culture >=100,000 COLONIES/mL ESCHERICHIA COLI (A)  Final   Report Status 01/07/2022 FINAL  Final   Organism ID, Bacteria ESCHERICHIA COLI (A)  Final      Susceptibility   Escherichia coli - MIC*    AMPICILLIN 16  INTERMEDIATE Intermediate     CEFAZOLIN <=4 SENSITIVE Sensitive     CEFEPIME <=0.12 SENSITIVE Sensitive     CEFTRIAXONE <=0.25 SENSITIVE Sensitive     CIPROFLOXACIN <=0.25 SENSITIVE Sensitive     GENTAMICIN <=1 SENSITIVE Sensitive     IMIPENEM <=0.25 SENSITIVE Sensitive     NITROFURANTOIN <=16 SENSITIVE Sensitive     TRIMETH/SULFA <=20 SENSITIVE Sensitive     AMPICILLIN/SULBACTAM 4 SENSITIVE Sensitive     PIP/TAZO <=4 SENSITIVE Sensitive     * >=100,000 COLONIES/mL ESCHERICHIA COLI      Radiology Studies: No results found.     LOS: 3 days   Dewaun Kinzler [employer] on [email or web address]  01/07/2022, 9:36 AM

## 2022-01-07 NOTE — Progress Notes (Signed)
Physical Therapy Treatment Patient Details Name: Sherry [name] MRN: [id number] DOB: Nov 15, 1930 Today's Date: 01/07/2022   History of Present Illness Patient is a 88 yo female presenting to the ED status post fall on 12/30/21, MRI and CT clear, who was then discharged and re-presenting to ED on 01/03/22 with headache, vomiting, and AMS. Patient with history of closed C1 fracture and hypotension.    PT Comments    Patient needing encouragement for OOB today and able to transition with mod A.  Still weak and painful and fearful of falling, but took one step with UE support to chair.  She is weak and has history of falls with significant injury.  Feel she may need stay on acute inpatient rehab prior to d/c home with spouse assist.  PT will continue to follow in the acute setting.  Recommendations for follow up therapy are one component of a multi-disciplinary discharge planning process, led by the attending physician.  Recommendations may be updated based on patient status, additional functional criteria and insurance authorization.  Follow Up Recommendations  Acute inpatient rehab (3hours/day)     Assistance Recommended at Discharge Frequent or constant Supervision/Assistance  Patient can return home with the following Assistance with cooking/housework;Assist for transportation;Help with stairs or ramp for entrance;A little help with bathing/dressing/bathroom;A little help with walking and/or transfers   Equipment Recommendations  Rolling walker (2 wheels)    Recommendations for Other Services Rehab consult     Precautions / Restrictions Precautions Precautions: Fall     Mobility  Bed Mobility Overal bed mobility: Needs Assistance Bed Mobility: Supine to Sit     Supine to sit: Mod assist, HOB elevated     General bed mobility comments: lifting help for trunk with pt not really wanting to get up despite encouragement    Transfers Overall transfer level: Needs assistance    Transfers: Sit to/from Stand, Bed to chair/wheelchair/BSC Sit to Stand: Mod assist   Step pivot transfers: Mod assist       General transfer comment: up to stand with HHA and chair close but needing both hands supported to step to chair with cues and pt fearful    Ambulation/Gait                   Stairs             Wheelchair Mobility    Modified Rankin (Stroke Patients Only)       Balance Overall balance assessment: Needs assistance Sitting-balance support: Feet supported Sitting balance-Leahy Scale: Fair     Standing balance support: Single extremity supported, Bilateral upper extremity supported Standing balance-Leahy Scale: Poor Standing balance comment: leaning back on bed initially in standing and needing both UE support and A for attempting to step to chair                            Cognition Arousal/Alertness: Awake/alert Behavior During Therapy: Anxious Overall Cognitive Status: Impaired/Different from baseline Area of Impairment: Attention, Following commands, Safety/judgement                   Current Attention Level: Selective   Following Commands: Follows one step commands consistently, Follows one step commands with increased time Safety/Judgement: Decreased awareness of safety     General Comments: able to recall not up for a couple days, but still wanting to put off again due to "just now able to get comfortable"; needed max encouragement  Exercises      General Comments General comments (skin integrity, edema, etc.): Encouraged to sit up to eat as had pain meds and may make her sick again if not eating so set up tray for pt and RN aware.      Pertinent Vitals/Pain Pain Assessment Faces Pain Scale: Hurts even more Pain Location: headache Pain Descriptors / Indicators: Grimacing, Headache Pain Intervention(s): Monitored during session, Repositioned    Home Living                           Prior Function            PT Goals (current goals can now be found in the care plan section) Progress towards PT goals: Progressing toward goals    Frequency    Min 3X/week      PT Plan Current plan remains appropriate    Co-evaluation              AM-PAC PT "6 Clicks" Mobility   Outcome Measure  Help needed turning from your back to your side while in a flat bed without using bedrails?: A Lot Help needed moving from lying on your back to sitting on the side of a flat bed without using bedrails?: A Lot Help needed moving to and from a bed to a chair (including a wheelchair)?: A Lot Help needed standing up from a chair using your arms (e.g., wheelchair or bedside chair)?: A Lot Help needed to walk in hospital room?: Total Help needed climbing 3-5 steps with a railing? : Total 6 Click Score: 10    End of Session Equipment Utilized During Treatment: Gait belt Activity Tolerance: Patient limited by pain Patient left: in chair;with chair alarm set Nurse Communication: Mobility status PT Visit Diagnosis: Other abnormalities of gait and mobility (R26.89);Muscle weakness (generalized) (M62.81);Other symptoms and signs involving the nervous system (R29.898);Pain Pain - part of body:  (head)     Time: 0165-5374 PT Time Calculation (min) (ACUTE ONLY): 21 min  Charges:  $Therapeutic Activity: 8-22 mins                     Magda Kiel, PT Acute Rehabilitation Services Office:[phone] 01/07/2022    Reginia Naas 01/07/2022, 4:56 PM

## 2022-01-07 NOTE — Progress Notes (Signed)
  Inpatient Rehab Admissions Coordinator :  Per therapy recommendations patient was screened for CIR candidacy by Danne Baxter RN MSN. Patient is not yet at a level to tolerate the intensity required to pursue a CIR admit. Patient may have the potential to progress to become a candidate. The CIR admissions team will follow and monitor for progress and place a Rehab Consult order if felt to be appropriate. If close to medically ready to discharge, I would pursue other rehab venues as CIR beds limited this week. Please contact me with any questions.  Danne Baxter RN MSN Admissions Coordinator [phone]

## 2022-01-08 DIAGNOSIS — G44319 Acute post-traumatic headache, not intractable: Secondary | ICD-10-CM | POA: Diagnosis not present

## 2022-01-08 DIAGNOSIS — E876 Hypokalemia: Secondary | ICD-10-CM

## 2022-01-08 DIAGNOSIS — I10 Essential (primary) hypertension: Secondary | ICD-10-CM | POA: Diagnosis not present

## 2022-01-08 LAB — COMPREHENSIVE METABOLIC PANEL
ALT: 14 U/L (ref 0–44)
AST: 15 U/L (ref 15–41)
Albumin: 2.7 g/dL — ABNORMAL LOW (ref 3.5–5.0)
Alkaline Phosphatase: 52 U/L (ref 38–126)
Anion gap: 12 (ref 5–15)
BUN: 17 mg/dL (ref 8–23)
CO2: 23 mmol/L (ref 22–32)
Calcium: 9.2 mg/dL (ref 8.9–10.3)
Chloride: 103 mmol/L (ref 98–111)
Creatinine, Ser: 0.69 mg/dL (ref 0.44–1.00)
GFR, Estimated: >60 mL/min (ref >60)
Glucose, Bld: 155 mg/dL — ABNORMAL HIGH (ref 70–99)
Potassium: 2.3 mmol/L — CL (ref 3.5–5.1)
Sodium: 138 mmol/L (ref 135–145)
Total Bilirubin: 0.4 mg/dL (ref 0.3–1.2)
Total Protein: 5.2 g/dL — ABNORMAL LOW (ref 6.5–8.1)

## 2022-01-08 LAB — CBC
HCT: 35.8 % — ABNORMAL LOW (ref 36.0–46.0)
Hemoglobin: 11.7 g/dL — ABNORMAL LOW (ref 12.0–15.0)
MCH: 28.2 pg (ref 26.0–34.0)
MCHC: 32.7 g/dL (ref 30.0–36.0)
MCV: 86.3 fL (ref 80.0–100.0)
Platelets: 269 10*3/uL (ref 150–400)
RBC: 4.15 MIL/uL (ref 3.87–5.11)
RDW: 14.4 % (ref 11.5–15.5)
WBC: 6.5 10*3/uL (ref 4.0–10.5)
nRBC: 0 % (ref 0.0–0.2)

## 2022-01-08 LAB — POTASSIUM: Potassium: 4.5 mmol/L (ref 3.5–5.1)

## 2022-01-08 LAB — MAGNESIUM: Magnesium: 1.4 mg/dL — ABNORMAL LOW (ref 1.7–2.4)

## 2022-01-08 MED ORDER — POTASSIUM CHLORIDE 20 MEQ PO PACK
40.0000 meq | PACK | Freq: Once | ORAL | Status: DC
  Filled 2022-01-08: qty 2

## 2022-01-08 MED ORDER — HALOPERIDOL 0.5 MG PO TABS
0.5000 mg | ORAL_TABLET | Freq: Once | ORAL | Status: AC
  Administered 2022-01-08: 0.5 mg via ORAL
  Filled 2022-01-08: qty 1

## 2022-01-08 MED ORDER — CEPHALEXIN 500 MG PO CAPS
500.0000 mg | ORAL_CAPSULE | Freq: Three times a day (TID) | ORAL | Status: DC
  Administered 2022-01-08 – 2022-01-10 (×6): 500 mg via ORAL
  Filled 2022-01-08 (×6): qty 1

## 2022-01-08 MED ORDER — HALOPERIDOL 0.5 MG PO TABS
0.5000 mg | ORAL_TABLET | Freq: Every evening | ORAL | Status: DC | PRN
  Administered 2022-01-08: 0.5 mg via ORAL
  Filled 2022-01-08 (×2): qty 1

## 2022-01-08 MED ORDER — POTASSIUM CHLORIDE CRYS ER 20 MEQ PO TBCR
40.0000 meq | EXTENDED_RELEASE_TABLET | Freq: Once | ORAL | Status: AC
  Administered 2022-01-08: 40 meq via ORAL
  Filled 2022-01-08: qty 2

## 2022-01-08 MED ORDER — POTASSIUM CHLORIDE 20 MEQ PO PACK: 40.0000 meq | PACK | Freq: Once | ORAL | Status: DC

## 2022-01-08 MED ORDER — MAGNESIUM SULFATE 4 GM/100ML IV SOLN
4.0000 g | Freq: Two times a day (BID) | INTRAVENOUS | Status: AC
  Administered 2022-01-08 (×2): 4 g via INTRAVENOUS
  Filled 2022-01-08 (×2): qty 100

## 2022-01-08 MED ORDER — MELATONIN 5 MG PO TABS: 10.0000 mg | ORAL_TABLET | Freq: Every evening | ORAL | Status: DC | PRN

## 2022-01-08 NOTE — Progress Notes (Signed)
Occupational Therapy Treatment Patient Details Name: Sherry [name] MRN: [id number] DOB: 08-Jul-1930 Today's Date: 01/08/2022   History of present illness Patient is a 88 yo female presenting to the ED status post fall on 12/30/21, MRI and CT clear, who was then discharged and re-presenting to ED on 01/03/22 with headache, vomiting, and AMS. Patient with history of closed C1 fracture and hypotension.   OT comments  Patient found with bed alarm going off and standing next to her bed with bloody arm. Patient had exited her bed and removed her own IV stating, "you did a botched job" needed max redirection in order to attend to task, with significant impulsivity and agitation with any question posed despite attempts at distraction and de-escalation. RN providing assist with IV management, with OT assisting in self care. Patient min A for upper body bathing and dressing, but remains impulsive and unsafe when in static standing position. Patient requiring increased encouragement to transition to recliner, eventually kicking OT out of the room stating, "our business is done here". Attempts to reorient were unsuccessful. Family was not present for session, however informing RN that if cognition does not clear, patient will need a sitter due to significant confusion.    Recommendations for follow up therapy are one component of a multi-disciplinary discharge planning process, led by the attending physician.  Recommendations may be updated based on patient status, additional functional criteria and insurance authorization.    Follow Up Recommendations  Acute inpatient rehab (3hours/day)    Assistance Recommended at Discharge Frequent or constant Supervision/Assistance  Patient can return home with the following  A lot of help with walking and/or transfers;A lot of help with bathing/dressing/bathroom;Assistance with cooking/housework;Assistance with feeding;Direct supervision/assist for medications  management;Direct supervision/assist for financial management;Assist for transportation;Help with stairs or ramp for entrance   Equipment Recommendations  None recommended by OT (patient has DME needed)    Recommendations for Other Services      Precautions / Restrictions Precautions Precautions: Fall Restrictions Weight Bearing Restrictions: No       Mobility Bed Mobility               General bed mobility comments: standing next to bed upon arrival with bed alarm going off    Transfers Overall transfer level: Needs assistance Equipment used: 1 person hand held assist Transfers: Sit to/from Stand Sit to Stand: Min assist           General transfer comment: standing upon arrival, able to complete sit<>stands with min A, no eccentric control when attempting to sit     Balance Overall balance assessment: Needs assistance Sitting-balance support: Feet supported Sitting balance-Leahy Scale: Fair     Standing balance support: Single extremity supported Standing balance-Leahy Scale: Poor Standing balance comment: very unsteady on feet without use of RW                           ADL either performed or assessed with clinical judgement   ADL Overall ADL's : Needs assistance/impaired     Grooming: Wash/dry hands;Wash/dry face;Sitting           Upper Body Dressing : Minimal assistance Upper Body Dressing Details (indicate cue type and reason): to don new gown and bathrobe     Toilet Transfer: Minimal assistance;Cueing for sequencing;Cueing for safety;Ambulation           Functional mobility during ADLs: Minimal assistance;Cueing for sequencing;Cueing for safety General ADL Comments: Session focus on  reorientation and redirection as patient found with fair amount of blood on her after removing her own IV    Extremity/Trunk Assessment              Vision       Perception     Praxis      Cognition Arousal/Alertness:  Awake/alert Behavior During Therapy: Agitated, Impulsive Overall Cognitive Status: Impaired/Different from baseline Area of Impairment: Attention, Following commands, Safety/judgement, Orientation, Memory, Awareness, Problem solving                 Orientation Level: Place, Time, Situation Current Attention Level: Selective Memory: Decreased recall of precautions, Decreased short-term memory Following Commands: Follows one step commands consistently, Follows one step commands with increased time Safety/Judgement: Decreased awareness of safety, Decreased awareness of deficits Awareness: Intellectual Problem Solving: Slow processing, Decreased initiation, Difficulty sequencing, Requires verbal cues, Requires tactile cues General Comments: Patient had exited her bed and removed her own IV stating, "you did a botched job" needed max redirection in order to attend to task, significant impulsivity and agitation with any question posed despite attempts at distraction and de-escalation. OT also handing patient call bell at end of session, patient immediately pressing the red button, when asked what the patient needed she stated, "well you handed me this to connect me with someone else". Reorientation re-attempted with minimal success.        Exercises      Shoulder Instructions       General Comments      Pertinent Vitals/ Pain       Pain Assessment Pain Assessment: Faces Faces Pain Scale: Hurts a little bit Pain Location: L arm after patient had taken her own IV out Pain Descriptors / Indicators: Discomfort Pain Intervention(s): Limited activity within patient's tolerance, Monitored during session, Repositioned  Home Living                                          Prior Functioning/Environment              Frequency  Min 2X/week        Progress Toward Goals  OT Goals(current goals can now be found in the care plan section)  Progress towards OT  goals: Progressing toward goals  Acute Rehab OT Goals Patient Stated Goal: "i have no buisness to discuss with you further" OT Goal Formulation: Patient unable to participate in goal setting Time For Goal Achievement: 01/20/22 Potential to Achieve Goals: [place] Discharge plan needs to be updated    Co-evaluation                 AM-PAC OT "6 Clicks" Daily Activity     Outcome Measure   Help from another person eating meals?: A Little Help from another person taking care of personal grooming?: A Little Help from another person toileting, which includes using toliet, bedpan, or urinal?: A Lot Help from another person bathing (including washing, rinsing, drying)?: A Lot Help from another person to put on and taking off regular upper body clothing?: A Little Help from another person to put on and taking off regular lower body clothing?: A Lot 6 Click Score: 15    End of Session    OT Visit Diagnosis: Unsteadiness on feet (R26.81);Other abnormalities of gait and mobility (R26.89);Muscle weakness (generalized) (M62.81);Repeated falls (R29.6);History of falling (Z91.81);Pain   Activity Tolerance Treatment  limited secondary to agitation   Patient Left in chair;with call bell/phone within reach;with chair alarm set   Nurse Communication Mobility status        Time: 5498-2641 OT Time Calculation (min): 16 min  Charges: OT General Charges $OT Visit: 1 Visit OT Treatments $Self Care/Home Management : 8-22 mins  Corinne Ports E. Drexel Ivey, OTR/L Acute Rehabilitation Services [phone]   Ascencion Dike 01/08/2022, 3:30 PM

## 2022-01-08 NOTE — Progress Notes (Signed)
[facility] PROGRESS NOTE   Sherry [name] TOI:[id number] DOB: Jan 13, 1931 DOA: 01/03/2022  PCP: Rusty Aus, MD  Brief History/Interval Summary: 88 yo female with the past medical history of hypertension, who was recently seen in the ED for a mechanical fall with head and neck trauma. C1 close fracture. Patient was discharged home where her condition continue to deteriorate with worsening confusion, prompting her family to bring her back to the hospital. Head CT with no acute intracranial changes, decreasing right frontal scalp hematoma. No acute maxillofacial bone fracture. Chest radiograph with no cardiomegaly, no infiltrates or effusions. Follow up Brain MRI with no acute changes.  UA raised concern for UTI.  Patient was hospitalized for further management.  Consultants: None  Procedures: None    Subjective/Interval History: Patient mentions that her headache is significantly better today.  5 out of 10 in intensity overnight and 2 out of 10 in intensity this morning.  No episodes of nausea vomiting since yesterday morning.    Assessment/Plan:  Acute metabolic encephalopathy Probably secondary to fall and concussion.  No focal neurological deficits appreciated. MRI brain did not show any acute findings.  Patient's mentationcontinues to improve.  Concussion with posttraumatic headache No injuries noted on imaging studies.  Patient with significant headache along with nausea and vomiting.  Was treated symptomatically yesterday.  Was given Fioricet with which she appears to have had a good response.  Headache is much better.  Continue Fioricet as needed.  Start mobilizing.  Hypokalemia and hypomagnesemia Will be aggressively repleted.  Recheck potassium level later today.  Urinary tract infection with E. coli UA noted to be abnormal.  Urine culture growing E. coli.  Sensitivities noted.  Continue ceftriaxone for now.  We will plan for 5-day course.  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  No neurological deficits noted.  Pain control.  Essential hypertension Noted to be on amlodipine, hydralazine and Avapro.  High blood pressure likely due to headaches.  Continue to monitor trends over the next 24 hours without any adjustment to dose today.  Hyperlipidemia Not on statin therapy.  History of anxiety Avoiding benzodiazepine.  Spinal stenosis at L4-L5 Has left-sided sciatica.  Stable.  PT and OT evaluation.   DVT Prophylaxis: SCDs Code Status: Full code Family Communication: Discussed with patient.  No family at bedside Disposition Plan: Inpatient rehabilitation recommended by physical therapy.  Patient mentioned that she is feeling better today and hopes that she can just go home in the next day or so.  Status is: Inpatient Remains inpatient appropriate because: Concussion, urinary tract infection      Medications: Scheduled:  acetaminophen  650 mg Oral TID   amLODipine  10 mg Oral Daily   famotidine  20 mg Oral Daily   hydrALAZINE  10 mg Oral Q8H   irbesartan  300 mg Oral Daily   pantoprazole  40 mg Oral BID   potassium chloride  40 mEq Oral Once   potassium chloride  40 mEq Oral Once   Continuous:  cefTRIAXone (ROCEPHIN)  IV 2 g (01/07/22 2116)   dextrose 5 % and 0.45% NaCl 50 mL/hr at 01/07/22 1324   magnesium sulfate bolus IVPB     IPJ:ASNKNLZJQBHAL, butalbital-acetaminophen-caffeine, HYDROmorphone (DILAUDID) injection, ondansetron **OR** ondansetron (ZOFRAN) IV, traMADol  Antibiotics: Anti-infectives (From admission, onward)    Start     Dose/Rate Route Frequency Ordered Stop   01/04/22 2215  cefTRIAXone (ROCEPHIN) 2 g in sodium chloride 0.9 %  100 mL IVPB        2 g 200 mL/hr over 30 Minutes Intravenous Every 24 hours 01/04/22 2205         Objective:  Vital Signs  Vitals:   01/07/22 1516 01/07/22 1937 01/08/22 0604 01/08/22 0834  BP: (!) 160/60 (!) 176/63 (!) 163/84 (!) 169/61   Pulse: 72 70 72 87  Resp: '16 16 18 18  '$ Temp: (!) 97.5 F (36.4 C) 97.8 F (36.6 C) 98.3 F (36.8 C) 98.4 F (36.9 C)  TempSrc: Oral Oral Oral Oral  SpO2: 99% 100% 98% 98%    Intake/Output Summary (Last 24 hours) at 01/08/2022 0920 Last data filed at 01/08/2022 0214 Gross per 24 hour  Intake 1497.07 ml  Output 825 ml  Net 672.07 ml    There were no vitals filed for this visit.   General appearance: Awake alert.  In no distress Resp: Clear to auscultation bilaterally.  Normal effort Cardio: S1-S2 is normal regular.  No S3-S4.  No rubs murmurs or bruit GI: Abdomen is soft.  Nontender nondistended.  Bowel sounds are present normal.  No masses organomegaly Extremities: No edema.  Full range of motion of lower extremities.     Lab Results:  Data Reviewed: I have personally reviewed following labs and reports of the imaging studies  CBC: Recent Labs  Lab 01/03/22 1352 01/04/22 0440 01/06/22 0346 01/08/22 0327  WBC 7.8 10.5 8.4 6.5  NEUTROABS 5.3  --   --   --   HGB 14.0 12.8 12.5 11.7*  HCT 43.2 40.2 38.1 35.8*  MCV 86.4 86.8 85.4 86.3  PLT 344 336 282 269     Basic Metabolic Panel: Recent Labs  Lab 01/03/22 1352 01/04/22 0440 01/06/22 0346 01/08/22 0327  NA 136 140 137 138  K 4.2 3.8 3.5 2.3*  CL 98 107 104 103  CO2 21* 21* 24 23  GLUCOSE 127* 103* 101* 155*  BUN 23 25* 21 17  CREATININE 0.74 0.67 0.58 0.69  CALCIUM 9.9 9.1 9.5 9.2  MG  --   --   --  1.4*     GFR: CrCl cannot be calculated (Unknown ideal weight.).  Liver Function Tests: Recent Labs  Lab 01/03/22 1352 01/04/22 0440 01/08/22 0327  AST '20 18 15  '$ ALT '14 11 14  '$ ALKPHOS 60 52 52  BILITOT 1.1 1.1 0.4  PROT 6.2* 5.7* 5.2*  ALBUMIN 3.8 3.4* 2.7*      Recent Results (from the past 240 hour(s))  Urine Culture     Status: Abnormal   Collection Time: 01/04/22  3:36 PM   Specimen: Urine, Clean Catch  Result Value Ref Range Status   Specimen Description URINE, CLEAN CATCH   Final   Special Requests   Final    NONE Performed at [facility], [place] [address], [place], [place] [address]    Culture >=100,000 COLONIES/mL ESCHERICHIA COLI (A)  Final   Report Status 01/07/2022 FINAL  Final   Organism ID, Bacteria ESCHERICHIA COLI (A)  Final      Susceptibility   Escherichia coli - MIC*    AMPICILLIN 16 INTERMEDIATE Intermediate     CEFAZOLIN <=4 SENSITIVE Sensitive     CEFEPIME <=0.12 SENSITIVE Sensitive     CEFTRIAXONE <=0.25 SENSITIVE Sensitive     CIPROFLOXACIN <=0.25 SENSITIVE Sensitive     GENTAMICIN <=1 SENSITIVE Sensitive     IMIPENEM <=0.25 SENSITIVE Sensitive     NITROFURANTOIN <=16 SENSITIVE Sensitive  TRIMETH/SULFA <=20 SENSITIVE Sensitive     AMPICILLIN/SULBACTAM 4 SENSITIVE Sensitive     PIP/TAZO <=4 SENSITIVE Sensitive     * >=100,000 COLONIES/mL ESCHERICHIA COLI      Radiology Studies: No results found.     LOS: 4 days   Krithi Bray [employer] on [email or web address]  01/08/2022, 9:20 AM

## 2022-01-08 NOTE — TOC Progression Note (Signed)
Transition of Care (TOC) - Progression Note    Patient Details  Name: Sherry [name] MRN: [id number] Date of Birth: 10/30/1930  Transition of Care (TOC) CM/SW Contact   R [place], RN Phone Number: 01/08/2022, 1:31 PM  Clinical Narrative:    CM met with the patient today - patient's sister and brother in law present in the room.  The patient lives at home with her husband at [address], [place], [place] and plans to continue to work with PT/OT.  I spoke with the patient with family at the bedside but not the husband and the sister said that patient has been active with [facility] in the past and does not want the company to provide HH if she ends up returning home with the husband.  CIR is following at this time for IP rehab but patient is not at the participatory level to pursue at this time.  CM will continue to follow the patient for CIR versus home with HH/PT.  The patient states that she currently attends OP PT through [facility].  Patient is not medically stable to discharge at this time.  Expected Discharge Plan: Home w Home Health Services Barriers to Discharge: Continued Medical Work up  Expected Discharge Plan and Services Expected Discharge Plan: Home w Home Health Services   Discharge Planning Services: CM Consult Post Acute Care Choice: Home Health Living arrangements for the past 2 months: Single Family Home                                       Social Determinants of Health (SDOH) Interventions    Readmission Risk Interventions    01/07/2022    2:50 PM  Readmission Risk Prevention Plan  Post Dischage Appt Complete  Medication Screening Complete  Transportation Screening Complete    

## 2022-01-08 NOTE — Progress Notes (Signed)
Patient pulled IV out and refused another one. Doctor made aware. He gave a verbal order to leave IV out for now.

## 2022-01-08 NOTE — Care Management Important Message (Signed)
Important Message  Patient Details  Name: Sherry [name] MRN: [id number] Date of Birth: 18-Mar-1930   Medicare Important Message Given:  Yes     Luisfernando Brightwell [address] 01/08/2022, 4:08 PM

## 2022-01-09 DIAGNOSIS — G44319 Acute post-traumatic headache, not intractable: Secondary | ICD-10-CM | POA: Diagnosis not present

## 2022-01-09 DIAGNOSIS — I10 Essential (primary) hypertension: Secondary | ICD-10-CM | POA: Diagnosis not present

## 2022-01-09 LAB — BASIC METABOLIC PANEL
Anion gap: 12 (ref 5–15)
BUN: 16 mg/dL (ref 8–23)
CO2: 21 mmol/L — ABNORMAL LOW (ref 22–32)
Calcium: 9.6 mg/dL (ref 8.9–10.3)
Chloride: 103 mmol/L (ref 98–111)
Creatinine, Ser: 0.62 mg/dL (ref 0.44–1.00)
GFR, Estimated: >60 mL/min (ref >60)
Glucose, Bld: 105 mg/dL — ABNORMAL HIGH (ref 70–99)
Potassium: 4.1 mmol/L (ref 3.5–5.1)
Sodium: 136 mmol/L (ref 135–145)

## 2022-01-09 LAB — MAGNESIUM: Magnesium: 3.7 mg/dL — ABNORMAL HIGH (ref 1.7–2.4)

## 2022-01-09 MED ORDER — HYDRALAZINE HCL 25 MG PO TABS
25.0000 mg | ORAL_TABLET | Freq: Three times a day (TID) | ORAL | Status: DC
  Administered 2022-01-09 – 2022-01-10 (×3): 25 mg via ORAL
  Filled 2022-01-09 (×3): qty 1

## 2022-01-09 NOTE — Progress Notes (Signed)
Patient's husband called pt settled in bed safety tele sitter in place. Observation in progress.

## 2022-01-09 NOTE — Progress Notes (Signed)
[facility] PROGRESS NOTE   Sherry [name] EXH:[id number] DOB: 07-17-1930 DOA: 01/03/2022  PCP: Rusty Aus, MD  Brief History/Interval Summary: 88 yo female with the past medical history of hypertension, who was recently seen in the ED for a mechanical fall with head and neck trauma. C1 close fracture. Patient was discharged home where her condition continue to deteriorate with worsening confusion, prompting her family to bring her back to the hospital. Head CT with no acute intracranial changes, decreasing right frontal scalp hematoma. No acute maxillofacial bone fracture. Chest radiograph with no cardiomegaly, no infiltrates or effusions. Follow up Brain MRI with no acute changes.  UA raised concern for UTI.  Patient was hospitalized for further management.  Consultants: None  Procedures: None    Subjective/Interval History: Seems to be calm this morning.  Answering all orientation questions appropriately.  Denies any complaints.  States that she wants to go home.      Assessment/Plan:  Acute metabolic encephalopathy Probably secondary to fall and concussion.  No focal neurological deficits appreciated. MRI brain did not show any acute findings.  Patient experiencing some sundowning.  Seems to be better this morning.  Haldol as needed at nighttime.  Concussion with posttraumatic headache No injuries noted on imaging studies.  Patient noted to have significant headache along with nausea and vomiting.  This appears to have improved after she was given Fioricet.  Headache is much better controlled at this time.   Hypokalemia and hypomagnesemia Has been corrected as of this morning.    Urinary tract infection with E. coli UA noted to be abnormal.  Urine culture growing E. coli.  Sensitivities noted.  Ceftriaxone changed over to cephalexin.    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  No neurological deficits noted.  Pain  control.  Essential hypertension Noted to be on amlodipine, hydralazine and Avapro.  Blood pressure is poorly controlled.  Initially thought to be due to headaches but now her headache is better controlled.  Will increase hydralazine to 25 mg 3 times a day.  Continue amlodipine 10 mg daily.  Continue irbesartan 300 mg daily.  Hyperlipidemia Not on statin therapy.  History of anxiety Avoiding benzodiazepine.  Spinal stenosis at L4-L5 Has left-sided sciatica.  Stable.     DVT Prophylaxis: SCDs Code Status: Full code Family Communication: Discussed with patient.  No family at bedside Disposition Plan: PT and OT continue to recommend inpatient rehabilitation.  Patient mentions that she is feeling much better.  We will wait for reevaluation.    Status is: Inpatient Remains inpatient appropriate because: Concussion, urinary tract infection      Medications: Scheduled:  amLODipine  10 mg Oral Daily   cephALEXin  500 mg Oral Q8H   famotidine  20 mg Oral Daily   hydrALAZINE  25 mg Oral Q8H   irbesartan  300 mg Oral Daily   pantoprazole  40 mg Oral BID   Continuous:   FYB:[id number], butalbital-acetaminophen-caffeine, haloperidol, HYDROmorphone (DILAUDID) injection, melatonin, ondansetron **OR** ondansetron (ZOFRAN) IV, traMADol  Antibiotics: Anti-infectives (From admission, onward)    Start     Dose/Rate Route Frequency Ordered Stop   01/08/22 1630  cephALEXin (KEFLEX) capsule 500 mg        500 mg Oral Every 8 hours 01/08/22 1536 01/11/22 1359   01/04/22 2215  cefTRIAXone (ROCEPHIN) 2 g in sodium chloride 0.9 % 100 mL IVPB  Status:  Discontinued        2  g 200 mL/hr over 30 Minutes Intravenous Every 24 hours 01/04/22 2205 01/08/22 1536       Objective:  Vital Signs  Vitals:   01/08/22 1500 01/08/22 2030 01/09/22 0427 01/09/22 0741  BP:  (!) 159/66 (!) 186/70 (!) 193/84  Pulse:  82 79 81  Resp:  '18 17 17  '$ Temp:  98.8 F (37.1 C) 97.7 F (36.5 C) 98.1 F (36.7  C)  TempSrc:  Oral Oral Oral  SpO2:  97% 97% 97%  Weight: 61.4 kg     Height: '5\' 2"'$  (1.575 m)       Intake/Output Summary (Last 24 hours) at 01/09/2022 0906 Last data filed at 01/09/2022 0400 Gross per 24 hour  Intake 350 ml  Output --  Net 350 ml    Filed Weights   01/08/22 1500  Weight: 61.4 kg     General appearance: Awake alert.  In no distress Resp: Clear to auscultation bilaterally.  Normal effort Cardio: S1-S2 is normal regular.  No S3-S4.  No rubs murmurs or bruit GI: Abdomen is soft.  Nontender nondistended.  Bowel sounds are present normal.  No masses organomegaly Extremities: No edema.  Full range of motion of lower extremities. Alert and oriented x3.  No obvious focal neurological deficits noted.     Lab Results:  Data Reviewed: I have personally reviewed following labs and reports of the imaging studies  CBC: Recent Labs  Lab 01/03/22 1352 01/04/22 0440 01/06/22 0346 01/08/22 0327  WBC 7.8 10.5 8.4 6.5  NEUTROABS 5.3  --   --   --   HGB 14.0 12.8 12.5 11.7*  HCT 43.2 40.2 38.1 35.8*  MCV 86.4 86.8 85.4 86.3  PLT 344 336 282 269     Basic Metabolic Panel: Recent Labs  Lab 01/03/22 1352 01/04/22 0440 01/06/22 0346 01/08/22 0327 01/08/22 1513 01/09/22 0303  NA 136 140 137 138  --  136  K 4.2 3.8 3.5 2.3* 4.5 4.1  CL 98 107 104 103  --  103  CO2 21* 21* 24 23  --  21*  GLUCOSE 127* 103* 101* 155*  --  105*  BUN 23 25* 21 17  --  16  CREATININE 0.74 0.67 0.58 0.69  --  0.62  CALCIUM 9.9 9.1 9.5 9.2  --  9.6  MG  --   --   --  1.4*  --  3.7*     GFR: Estimated Creatinine Clearance: 41.1 mL/min (by C-G formula based on SCr of 0.62 mg/dL).  Liver Function Tests: Recent Labs  Lab 01/03/22 1352 01/04/22 0440 01/08/22 0327  AST '20 18 15  '$ ALT '14 11 14  '$ ALKPHOS 60 52 52  BILITOT 1.1 1.1 0.4  PROT 6.2* 5.7* 5.2*  ALBUMIN 3.8 3.4* 2.7*      Recent Results (from the past 240 hour(s))  Urine Culture     Status: Abnormal    Collection Time: 01/04/22  3:36 PM   Specimen: Urine, Clean Catch  Result Value Ref Range Status   Specimen Description URINE, CLEAN CATCH  Final   Special Requests   Final    NONE Performed at [facility], [place] [address], [place], [place] [address]    Culture >=100,000 COLONIES/mL ESCHERICHIA COLI (A)  Final   Report Status 01/07/2022 FINAL  Final   Organism ID, Bacteria ESCHERICHIA COLI (A)  Final      Susceptibility   Escherichia coli - MIC*    AMPICILLIN 16 INTERMEDIATE Intermediate  CEFAZOLIN <=4 SENSITIVE Sensitive     CEFEPIME <=0.12 SENSITIVE Sensitive     CEFTRIAXONE <=0.25 SENSITIVE Sensitive     CIPROFLOXACIN <=0.25 SENSITIVE Sensitive     GENTAMICIN <=1 SENSITIVE Sensitive     IMIPENEM <=0.25 SENSITIVE Sensitive     NITROFURANTOIN <=16 SENSITIVE Sensitive     TRIMETH/SULFA <=20 SENSITIVE Sensitive     AMPICILLIN/SULBACTAM 4 SENSITIVE Sensitive     PIP/TAZO <=4 SENSITIVE Sensitive     * >=100,000 COLONIES/mL ESCHERICHIA COLI      Radiology Studies: No results found.     LOS: 5 days   Teela Narducci [employer] on [email or web address]  01/09/2022, 9:06 AM

## 2022-01-09 NOTE — Progress Notes (Addendum)
Pt noted to be getting agitate on call doctor informed plan haldol prn and melatonin see new orders. Also pt was moved close to the nursing station.

## 2022-01-10 DIAGNOSIS — G9341 Metabolic encephalopathy: Secondary | ICD-10-CM | POA: Diagnosis not present

## 2022-01-10 LAB — URINALYSIS, ROUTINE W REFLEX MICROSCOPIC
Bacteria, UA: NONE SEEN
Bilirubin Urine: NEGATIVE
Glucose, UA: NEGATIVE mg/dL
Hgb urine dipstick: NEGATIVE
Ketones, ur: NEGATIVE mg/dL
Nitrite: NEGATIVE
Protein, ur: NEGATIVE mg/dL
Specific Gravity, Urine: 1.011 (ref 1.005–1.030)
pH: 6 (ref 5.0–8.0)

## 2022-01-10 MED ORDER — HYDRALAZINE HCL 50 MG PO TABS: 50.0000 mg | ORAL_TABLET | Freq: Three times a day (TID) | ORAL | Status: DC

## 2022-01-10 MED ORDER — HYDRALAZINE HCL 50 MG PO TABS: 50.0000 mg | ORAL_TABLET | Freq: Three times a day (TID) | ORAL | 2 refills | Status: DC

## 2022-01-10 MED ORDER — CEPHALEXIN 500 MG PO CAPS: 500.0000 mg | ORAL_CAPSULE | Freq: Three times a day (TID) | ORAL | 0 refills | Status: AC

## 2022-01-10 MED ORDER — AMLODIPINE BESYLATE 10 MG PO TABS: 10.0000 mg | ORAL_TABLET | Freq: Every day | ORAL | 2 refills | Status: DC

## 2022-01-10 MED ORDER — PHENAZOPYRIDINE HCL 100 MG PO TABS: 100.0000 mg | ORAL_TABLET | Freq: Three times a day (TID) | ORAL | 0 refills | Status: AC

## 2022-01-10 MED ORDER — PHENAZOPYRIDINE HCL 100 MG PO TABS: 100.0000 mg | ORAL_TABLET | Freq: Three times a day (TID) | ORAL | 0 refills | Status: DC

## 2022-01-10 MED ORDER — CEPHALEXIN 500 MG PO CAPS: 500.0000 mg | ORAL_CAPSULE | Freq: Three times a day (TID) | ORAL | 0 refills | Status: DC

## 2022-01-10 MED ORDER — PHENAZOPYRIDINE HCL 100 MG PO TABS
100.0000 mg | ORAL_TABLET | Freq: Three times a day (TID) | ORAL | Status: DC
  Administered 2022-01-10: 100 mg via ORAL
  Filled 2022-01-10 (×2): qty 1

## 2022-01-10 NOTE — Progress Notes (Signed)
Physical Therapy Treatment Patient Details Name: Sherry [name] MRN: [id number] DOB: 12-11-1930 Today's Date: 01/10/2022   History of Present Illness Patient is a 88 yo female presenting to the ED status post fall on 12/30/21, MRI and CT clear, who was then discharged and re-presenting to ED on 01/03/22 with headache, vomiting, and AMS. Patient with history of closed C1 fracture and hypotension.    PT Comments    Pt is demonstrating great progress in regards to her cognition, pain level, and mobility. She is A&Ox4 and very eager to participate, reporting no pain today. She was able to perform all functional mobility without LOB or need for assistance, using a RW for gait bouts. Family present and confirming pt appears close to her baseline cognitively and physically. Educated pt and family on assistance needed at d/c and on concussion handout. They verbalized understanding and no further questions. As pt has made great progress and is now at a level her family can safely manage her, updated d/c recs to [place]. Will continue to follow acutely.      Recommendations for follow up therapy are one component of a multi-disciplinary discharge planning process, led by the attending physician.  Recommendations may be updated based on patient status, additional functional criteria and insurance authorization.  Follow Up Recommendations  Home health PT     Assistance Recommended at Discharge Frequent or constant Supervision/Assistance  Patient can return home with the following Assistance with cooking/housework;Assist for transportation;Help with stairs or ramp for entrance;A little help with bathing/dressing/bathroom;Direct supervision/assist for medications management;Direct supervision/assist for financial management   Equipment Recommendations  Rolling walker (2 wheels)    Recommendations for Other Services       Precautions / Restrictions Precautions Precautions: Fall Restrictions Weight  Bearing Restrictions: No     Mobility  Bed Mobility Overal bed mobility: Modified Independent Bed Mobility: Supine to Sit, Sit to Supine     Supine to sit: Modified independent (Device/Increase time), HOB elevated Sit to supine: Modified independent (Device/Increase time), HOB elevated   General bed mobility comments: HOB elevated, no assistance needed.    Transfers Overall transfer level: Needs assistance Equipment used: Rolling walker (2 wheels) Transfers: Sit to/from Stand Sit to Stand: Supervision           General transfer comment: Supervision for safety coming to stand from EOB 1x and from toilet 1x, no LOB    Ambulation/Gait Ambulation/Gait assistance: Min guard, Supervision Gait Distance (Feet): 375 Feet (x2 bouts of ~15 ft > ~375 ft) Assistive device: Rolling walker (2 wheels) Gait Pattern/deviations: Step-through pattern, Decreased stride length (L leg externally rotated) Gait velocity: reduced Gait velocity interpretation: 1.31 - 2.62 ft/sec, indicative of limited community ambulator   General Gait Details: Pt with fairly steady gait and noted L leg externally rotated, which pt reports is her baseline. Family confirmed pt is close to her baseline. No LOB, min guard-supervision for safety   Stairs             Wheelchair Mobility    Modified Rankin (Stroke Patients Only)       Balance Overall balance assessment: Needs assistance Sitting-balance support: No upper extremity supported, Feet supported Sitting balance-Leahy Scale: Good     Standing balance support: No upper extremity supported, Bilateral upper extremity supported, During functional activity Standing balance-Leahy Scale: Good Standing balance comment: Able to reach mod off BOS when standing at sink without UE support and uses RW for mobility, no LOB  Cognition Arousal/Alertness: Awake/alert Behavior During Therapy: WFL for tasks  assessed/performed Overall Cognitive Status: Impaired/Different from baseline Area of Impairment: Attention                   Current Attention Level: Alternating           General Comments: Husband and family report pt appears close to if not back to her baseline. Pt would talk over family member when they were asking questions at times, not sure if due to poor attention or social awareness or if that is her baseline. Pt pleasant and following all cues appropriately throughout session. A&Ox4.        Exercises      General Comments General comments (skin integrity, edema, etc.): provided pt and family handout on concussions and education; educated family to supervise/assist her with meds and finances and to provide transportation at this time      Pertinent Vitals/Pain Pain Assessment Pain Assessment: No/denies pain Pain Intervention(s): Monitored during session    Home Living                          Prior Function            PT Goals (current goals can now be found in the care plan section) Acute Rehab PT Goals Patient Stated Goal: to go home PT Goal Formulation: With patient/family Time For Goal Achievement: 01/20/22 Potential to Achieve Goals: Good Progress towards PT goals: Progressing toward goals    Frequency    Min 3X/week      PT Plan Discharge plan needs to be updated    Co-evaluation              AM-PAC PT "6 Clicks" Mobility   Outcome Measure  Help needed turning from your back to your side while in a flat bed without using bedrails?: None Help needed moving from lying on your back to sitting on the side of a flat bed without using bedrails?: None Help needed moving to and from a bed to a chair (including a wheelchair)?: A Little Help needed standing up from a chair using your arms (e.g., wheelchair or bedside chair)?: A Little Help needed to walk in hospital room?: A Little Help needed climbing 3-5 steps with a railing?  : A Little 6 Click Score: 20    End of Session Equipment Utilized During Treatment: Gait belt Activity Tolerance: Patient tolerated treatment well Patient left: in bed;with call bell/phone within reach;with bed alarm set;with family/visitor present Nurse Communication: Mobility status PT Visit Diagnosis: Other abnormalities of gait and mobility (R26.89);Muscle weakness (generalized) (M62.81);Other symptoms and signs involving the nervous system (R29.898)     Time: 3716-9678 PT Time Calculation (min) (ACUTE ONLY): 22 min  Charges:  $Therapeutic Activity: 8-22 mins                     Moishe Spice, PT, DPT Acute Rehabilitation Services  Office: [place] 01/10/2022, 12:00 PM

## 2022-01-10 NOTE — TOC Transition Note (Signed)
Transition of Care [facility]) - CM/SW Discharge Note   Patient Details  Name: Sherry [name] MRN: [id number] Date of Birth: [birth year]  Transition of Care [facility]) CM/SW Contact:  Carles Collet, RN Phone Number: 01/10/2022, 12:50 PM   Clinical Narrative:     Damaris Schooner w patient and spouse at bedside. They are agreeable to set up w [facility], agreeable for CM to choose highly rated agency, Alvis Lemmings able to accept for [facility] PT OT, address [place] [address]   Final next level of care: [place] Barriers to Discharge: No Barriers Identified   Patient Goals and CMS Choice Patient states their goals for this hospitalization and ongoing recovery are:: To get better - possible pending to home with husband if progresses well with PT/OT CMS Medicare.gov Compare Post Acute Care list provided to:: Patient Represenative (must comment) (Patient's husband) Choice offered to / list presented to : Spouse  Discharge Placement                       Discharge Plan and Services   Discharge Planning Services: CM Consult Post Acute Care Choice: Home Health                    HH Arranged: PT, OT [facility] Agency: [place] Date [place]: 01/10/22 Time [place] Agency Contacted: 1250 Representative spoke with at [place]: [place] Determinants of Health ([place]) Interventions     Readmission Risk Interventions    01/07/2022    2:50 PM  Readmission Risk Prevention Plan  Post Dischage Appt Complete  Medication Screening Complete  Transportation Screening Complete

## 2022-01-10 NOTE — Progress Notes (Signed)
[facility] PROGRESS NOTE   Sherry [name] AST:[id number] DOB: March 28, 1930 DOA: 01/03/2022  PCP: Rusty Aus, MD  Brief History/Interval Summary: 88 yo female with the past medical history of hypertension, who was recently seen in the ED for a mechanical fall with head and neck trauma. C1 close fracture. Patient was discharged home where her condition continue to deteriorate with worsening confusion, prompting her family to bring her back to the hospital. Head CT with no acute intracranial changes, decreasing right frontal scalp hematoma. No acute maxillofacial bone fracture. Chest radiograph with no cardiomegaly, no infiltrates or effusions. Follow up Brain MRI with no acute changes.  UA raised concern for UTI.  Patient was hospitalized for further management.  Consultants: None  Procedures: None    Subjective/Interval History: Patient mentions that she feels well.  Frustrated by having to lay in the bed all day long.  Denies any new complaints.      Assessment/Plan:  Acute metabolic encephalopathy Probably secondary to fall and concussion.  No focal neurological deficits appreciated. MRI brain did not show any acute findings.  Patient did experience some sundowning.  However has been stable for the last 36 hours.  Concussion with posttraumatic headache No injuries noted on imaging studies.  Patient noted to have significant headache along with nausea and vomiting.  This appears to have improved after she was given Fioricet.  Headache is much better controlled at this time.   Hypokalemia and hypomagnesemia Repleted  Urinary tract infection with E. coli UA noted to be abnormal.  Urine culture growing E. coli.  Sensitivities noted.  Ceftriaxone changed over to cephalexin.   Complains of dysuria this morning.  We will repeat a UA.  Continue cephalexin.  Use Pyridium.  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  No neurological deficits  noted.  Pain control.  Essential hypertension Noted to be on amlodipine, hydralazine and Avapro.  Blood pressure remains poorly controlled.  Will increase the dose of hydralazine. Continue with amlodipine and irbesartan.  HCTZ remains on hold.  Hyperlipidemia Not on statin therapy.  History of anxiety Avoiding benzodiazepine.  Spinal stenosis at L4-L5 Has left-sided sciatica.  Stable.     DVT Prophylaxis: SCDs Code Status: Full code Family Communication: Discussed with patient.  No family at bedside Disposition Plan: PT and OT continue to recommend inpatient rehabilitation.  Patient mentions that she is feeling much better.  Await PT evaluation.  Status is: Inpatient Remains inpatient appropriate because: Concussion, urinary tract infection      Medications: Scheduled:  amLODipine  10 mg Oral Daily   cephALEXin  500 mg Oral Q8H   famotidine  20 mg Oral Daily   hydrALAZINE  50 mg Oral Q8H   irbesartan  300 mg Oral Daily   pantoprazole  40 mg Oral BID   Continuous:   [id number], butalbital-acetaminophen-caffeine, haloperidol, HYDROmorphone (DILAUDID) injection, melatonin, ondansetron **OR** ondansetron (ZOFRAN) IV, traMADol  Antibiotics: Anti-infectives (From admission, onward)    Start     Dose/Rate Route Frequency Ordered Stop   01/08/22 1630  cephALEXin (KEFLEX) capsule 500 mg        500 mg Oral Every 8 hours 01/08/22 1536 01/11/22 1359   01/04/22 2215  cefTRIAXone (ROCEPHIN) 2 g in sodium chloride 0.9 % 100 mL IVPB  Status:  Discontinued        2 g 200 mL/hr over 30 Minutes Intravenous Every 24 hours 01/04/22 2205 01/08/22 1536  Objective:  Vital Signs  Vitals:   01/09/22 1556 01/09/22 2015 01/10/22 0615 01/10/22 0802  BP: (!) 146/58 (!) 162/71 (!) 185/65 (!) 176/68  Pulse: 83 78 86 80  Resp: '17 18 18 18  '$ Temp: 98.3 F (36.8 C) 98.3 F (36.8 C) 97.8 F (36.6 C) 98.2 F (36.8 C)  TempSrc: Oral Oral Oral Oral  SpO2: 99% 99% 99% 98%   Weight:      Height:        Intake/Output Summary (Last 24 hours) at 01/10/2022 0900 Last data filed at 01/09/2022 1728 Gross per 24 hour  Intake 100 ml  Output --  Net 100 ml    Filed Weights   01/08/22 1500  Weight: 61.4 kg    General appearance: Awake alert.  In no distress Resp: Clear to auscultation bilaterally.  Normal effort Cardio: S1-S2 is normal regular.  No S3-S4.  No rubs murmurs or bruit GI: Abdomen is soft.  Nontender nondistended.  Bowel sounds are present normal.  No masses organomegaly Extremities: No edema.  Full range of motion of lower extremities. Neurologic: Alert and oriented x3.  No focal neurological deficits.      Lab Results:  Data Reviewed: I have personally reviewed following labs and reports of the imaging studies  CBC: Recent Labs  Lab 01/03/22 1352 01/04/22 0440 01/06/22 0346 01/08/22 0327  WBC 7.8 10.5 8.4 6.5  NEUTROABS 5.3  --   --   --   HGB 14.0 12.8 12.5 11.7*  HCT 43.2 40.2 38.1 35.8*  MCV 86.4 86.8 85.4 86.3  PLT 344 336 282 269     Basic Metabolic Panel: Recent Labs  Lab 01/03/22 1352 01/04/22 0440 01/06/22 0346 01/08/22 0327 01/08/22 1513 01/09/22 0303  NA 136 140 137 138  --  136  K 4.2 3.8 3.5 2.3* 4.5 4.1  CL 98 107 104 103  --  103  CO2 21* 21* 24 23  --  21*  GLUCOSE 127* 103* 101* 155*  --  105*  BUN 23 25* 21 17  --  16  CREATININE 0.74 0.67 0.58 0.69  --  0.62  CALCIUM 9.9 9.1 9.5 9.2  --  9.6  MG  --   --   --  1.4*  --  3.7*     GFR: Estimated Creatinine Clearance: 41.1 mL/min (by C-G formula based on SCr of 0.62 mg/dL).  Liver Function Tests: Recent Labs  Lab 01/03/22 1352 01/04/22 0440 01/08/22 0327  AST '20 18 15  '$ ALT '14 11 14  '$ ALKPHOS 60 52 52  BILITOT 1.1 1.1 0.4  PROT 6.2* 5.7* 5.2*  ALBUMIN 3.8 3.4* 2.7*      Recent Results (from the past 240 hour(s))  Urine Culture     Status: Abnormal   Collection Time: 01/04/22  3:36 PM   Specimen: Urine, Clean Catch  Result Value  Ref Range Status   Specimen Description URINE, CLEAN CATCH  Final   Special Requests   Final    NONE Performed at [facility], [place] [address] [place], [place] [address]    Culture >=100,000 COLONIES/mL ESCHERICHIA COLI (A)  Final   Report Status 01/07/2022 FINAL  Final   Organism ID, Bacteria ESCHERICHIA COLI (A)  Final      Susceptibility   Escherichia coli - MIC*    AMPICILLIN 16 INTERMEDIATE Intermediate     CEFAZOLIN <=4 SENSITIVE Sensitive     CEFEPIME <=0.12 SENSITIVE Sensitive     CEFTRIAXONE <=0.25  SENSITIVE Sensitive     CIPROFLOXACIN <=0.25 SENSITIVE Sensitive     GENTAMICIN <=1 SENSITIVE Sensitive     IMIPENEM <=0.25 SENSITIVE Sensitive     NITROFURANTOIN <=16 SENSITIVE Sensitive     TRIMETH/SULFA <=20 SENSITIVE Sensitive     AMPICILLIN/SULBACTAM 4 SENSITIVE Sensitive     PIP/TAZO <=4 SENSITIVE Sensitive     * >=100,000 COLONIES/mL ESCHERICHIA COLI      Radiology Studies: No results found.     LOS: 6 days   Clarkson Rosselli [employer] on [email or web address]  01/10/2022, 9:00 AM

## 2022-01-10 NOTE — Discharge Summary (Signed)
[facility]  Physician Discharge Summary   Patient ID: Sherry [name] MRN: [id number] DOB/AGE: 1930/08/23 88 y.o.  Admit date: 01/03/2022 Discharge date: 01/10/2022    PCP: Rusty Aus, MD  DISCHARGE DIAGNOSES:     Acute metabolic encephalopathy   Essential hypertension   Hyperlipidemia   Closed C1 fracture (HCC)   Anxiety   Spinal stenosis at L4-L5 level   UTI (urinary tract infection)   RECOMMENDATIONS FOR OUTPATIENT FOLLOW UP: Follow-up with PCP for blood pressure management   Home Health: PT and OT Equipment/Devices: None  CODE STATUS: Full code  DISCHARGE CONDITION: fair  Diet recommendation: As before  INITIAL HISTORY:  88 yo female with the past medical history of hypertension, who was recently seen in the ED for a mechanical fall with head and neck trauma. C1 close fracture. Patient was discharged home where her condition continue to deteriorate with worsening confusion, prompting her family to bring her back to the hospital. Head CT with no acute intracranial changes, decreasing right frontal scalp hematoma. No acute maxillofacial bone fracture. Chest radiograph with no cardiomegaly, no infiltrates or effusions. Follow up Brain MRI with no acute changes.  UA raised concern for UTI.  Patient was hospitalized for further management.    HOSPITAL COURSE:   Acute metabolic encephalopathy Probably secondary to fall and concussion.  No focal neurological deficits appreciated. MRI brain did not show any acute findings.  Patient did experience some sundowning.  However has been stable for the last 48 hours.   Concussion with posttraumatic headache No injuries noted on imaging studies.  Patient noted to have significant headache along with nausea and vomiting.  This appears to have improved after she was given Fioricet.  Headache is much better controlled at this time.    Hypokalemia and hypomagnesemia Repleted   Urinary tract infection with E.  coli UA noted to be abnormal.  Urine culture growing E. coli.  Sensitivities noted.  Ceftriaxone changed over to cephalexin.   Complains of dysuria this morning.  Continue cephalexin.  Use Pyridium.   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  No neurological deficits noted.  Pain control.   Essential hypertension Continue with amlodipine hydralazine, Avapro and HCTZ.  Outpatient monitoring.   Hyperlipidemia Not on statin therapy.   History of anxiety Avoiding benzodiazepine.   Spinal stenosis at L4-L5 Has left-sided sciatica.  Stable.     Patient is stable.  Patient was waiting on CIR.  However she mentioned that she is feeling much better compared to the last few days.  She was reevaluated by PT and they did find that the patient has significantly improved.  In view of this we will change her to home health and she could be discharged today.  This was discussed with patient's husband and son and they are both agreeable to take her home today.   PERTINENT LABS:  The results of significant diagnostics from this hospitalization (including imaging, microbiology, ancillary and laboratory) are listed below for reference.    Microbiology: Recent Results (from the past 240 hour(s))  Urine Culture     Status: Abnormal   Collection Time: 01/04/22  3:36 PM   Specimen: Urine, Clean Catch  Result Value Ref Range Status   Specimen Description URINE, CLEAN CATCH  Final   Special Requests   Final    NONE Performed at [facility], [address], [place], [place] [address]    Culture >=100,000 COLONIES/mL ESCHERICHIA  COLI (A)  Final   Report Status 01/07/2022 FINAL  Final   Organism ID, Bacteria ESCHERICHIA COLI (A)  Final      Susceptibility   Escherichia coli - MIC*    AMPICILLIN 16 INTERMEDIATE Intermediate     CEFAZOLIN <=4 SENSITIVE Sensitive     CEFEPIME <=0.12 SENSITIVE Sensitive     CEFTRIAXONE <=0.25 SENSITIVE Sensitive     CIPROFLOXACIN  <=0.25 SENSITIVE Sensitive     GENTAMICIN <=1 SENSITIVE Sensitive     IMIPENEM <=0.25 SENSITIVE Sensitive     NITROFURANTOIN <=16 SENSITIVE Sensitive     TRIMETH/SULFA <=20 SENSITIVE Sensitive     AMPICILLIN/SULBACTAM 4 SENSITIVE Sensitive     PIP/TAZO <=4 SENSITIVE Sensitive     * >=100,000 COLONIES/mL ESCHERICHIA COLI     Labs:   Basic Metabolic Panel: Recent Labs  Lab 01/06/22 0346 01/08/22 0327 01/08/22 1513 01/09/22 0303  NA 137 138  --  136  K 3.5 2.3* 4.5 4.1  CL 104 103  --  103  CO2 24 23  --  21*  GLUCOSE 101* 155*  --  105*  BUN 21 17  --  16  CREATININE 0.58 0.69  --  0.62  CALCIUM 9.5 9.2  --  9.6  MG  --  1.4*  --  3.7*   Liver Function Tests: Recent Labs  Lab 01/08/22 0327  AST 15  ALT 14  ALKPHOS 52  BILITOT 0.4  PROT 5.2*  ALBUMIN 2.7*    CBC: Recent Labs  Lab 01/06/22 0346 01/08/22 0327  WBC 8.4 6.5  HGB 12.5 11.7*  HCT 38.1 35.8*  MCV 85.4 86.3  PLT 282 269      IMAGING STUDIES MR BRAIN WO CONTRAST  Result Date: 01/04/2022 CLINICAL DATA:  Initial evaluation for neuro deficit, stroke suspected. EXAM: MRI HEAD WITHOUT CONTRAST TECHNIQUE: Multiplanar, multiecho pulse sequences of the brain and surrounding structures were obtained without intravenous contrast. COMPARISON:  Prior CT from 01/03/2022 as well as previous MRI from 12/30/2021. FINDINGS: Brain: Examination moderately degraded by motion artifact. Cerebral volume within normal limits. Chronic microvascular ischemic disease again noted involving the supratentorial cerebral white matter. Few small remote lacunar infarcts noted about the left basal ganglia and left thalamus. Chronic encephalomalacia related to prior traumatic injury at the anterior/inferior right frontal lobe again noted. No visible acute or subacute ischemia. No acute intracranial hemorrhage. Small focus of chronic blood products at the anterior/inferior left frontal lobe, stable. No mass lesion, midline shift or mass  effect. No hydrocephalus or extra-axial fluid collection. Pituitary gland and suprasellar region within normal limits. Vascular: Major intracranial vascular flow voids are maintained. 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 Bone marrow signal intensity within normal limits. Evolving soft tissue contusion at the right frontal scalp noted. Sinuses/Orbits: Prior bilateral ocular lens replacement. Mucosal thickening about the left sphenoid sinus, similar to prior. Paranasal sinuses are otherwise clear. Small right mastoid effusion, also similar. Other: None. IMPRESSION: 1. Motion degraded exam. 2. No acute intracranial infarct or other abnormality. 3. Stable brain MRI with chronic microvascular ischemic disease and chronic right frontal encephalomalacia. 4. 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 Electronically Signed   By: Jeannine Boga M.D.   On: 01/04/2022 03:48   DG Chest Port 1 View  Result Date: 01/03/2022 CLINICAL DATA:  Shortness of breath EXAM: PORTABLE CHEST 1 VIEW COMPARISON:  05/14/2021 FINDINGS: The cardiomediastinal silhouette is unremarkable. There is no evidence of focal  airspace disease, pulmonary edema, suspicious pulmonary nodule/mass, pleural effusion, or pneumothorax. No acute bony abnormalities are identified. IMPRESSION: No active disease. Electronically Signed   By: Margarette Canada M.D.   On: 01/03/2022 16:01   CT Head Wo Contrast  Result Date: 01/03/2022 CLINICAL DATA:  Head trauma, moderate-severe; Facial trauma, blunt. Increased confusion EXAM: CT HEAD WITHOUT CONTRAST CT MAXILLOFACIAL WITHOUT CONTRAST TECHNIQUE: Multidetector CT imaging of the head and maxillofacial structures were performed using the standard protocol without intravenous contrast. Multiplanar CT image reconstructions of the maxillofacial structures were also generated. RADIATION DOSE REDUCTION: This exam was performed according to the departmental  dose-optimization program which includes automated exposure control, adjustment of the mA and/or kV according to patient size and/or use of iterative reconstruction technique. COMPARISON:  CT 12/30/2021, MRI 12/30/2021 FINDINGS: CT HEAD FINDINGS Technical note: Images are degraded by patient motion. Exam was repeated with improvement in the degree of motion artifact. Brain: No evidence of acute infarction, hemorrhage, hydrocephalus, extra-axial collection or mass lesion/mass effect. Patchy low-density changes within the periventricular and subcortical white matter compatible with chronic microvascular ischemic change. Moderate diffuse cerebral volume loss. Vascular: Atherosclerotic calcifications involving the large vessels of the skull base. No unexpected hyperdense vessel. Skull: Negative for acute calvarial fracture. Other: Decreasing right frontal scalp hematoma. CT MAXILLOFACIAL FINDINGS Osseous: No acute maxillofacial bone fracture. Bony orbital walls are intact. Mandible intact. Temporomandibular joints are aligned without dislocation. Known chronic type 2 dens fracture with mild angulation, unchanged from 12/30/2021. Orbits: Negative. No traumatic or inflammatory finding. Sinuses: Mild left sphenoid mucosal thickening. Soft tissues: Negative. IMPRESSION: 1. No acute intracranial abnormality. 2. Decreasing right frontal scalp hematoma. 3. No acute maxillofacial bone fracture. 4. Known chronic type 2 dens fracture with mild angulation, unchanged from 12/30/2021. Electronically Signed   By: Davina Poke D.O.   On: 01/03/2022 15:26   CT Maxillofacial Wo Contrast  Result Date: 01/03/2022 CLINICAL DATA:  Head trauma, moderate-severe; Facial trauma, blunt. Increased confusion EXAM: CT HEAD WITHOUT CONTRAST CT MAXILLOFACIAL WITHOUT CONTRAST TECHNIQUE: Multidetector CT imaging of the head and maxillofacial structures were performed using the standard protocol without intravenous contrast. Multiplanar CT image  reconstructions of the maxillofacial structures were also generated. RADIATION DOSE REDUCTION: This exam was performed according to the departmental dose-optimization program which includes automated exposure control, adjustment of the mA and/or kV according to patient size and/or use of iterative reconstruction technique. COMPARISON:  CT 12/30/2021, MRI 12/30/2021 FINDINGS: CT HEAD FINDINGS Technical note: Images are degraded by patient motion. Exam was repeated with improvement in the degree of motion artifact. Brain: No evidence of acute infarction, hemorrhage, hydrocephalus, extra-axial collection or mass lesion/mass effect. Patchy low-density changes within the periventricular and subcortical white matter compatible with chronic microvascular ischemic change. Moderate diffuse cerebral volume loss. Vascular: Atherosclerotic calcifications involving the large vessels of the skull base. No unexpected hyperdense vessel. Skull: Negative for acute calvarial fracture. Other: Decreasing right frontal scalp hematoma. CT MAXILLOFACIAL FINDINGS Osseous: No acute maxillofacial bone fracture. Bony orbital walls are intact. Mandible intact. Temporomandibular joints are aligned without dislocation. Known chronic type 2 dens fracture with mild angulation, unchanged from 12/30/2021. Orbits: Negative. No traumatic or inflammatory finding. Sinuses: Mild left sphenoid mucosal thickening. Soft tissues: Negative. IMPRESSION: 1. No acute intracranial abnormality. 2. Decreasing right frontal scalp hematoma. 3. No acute maxillofacial bone fracture. 4. Known chronic type 2 dens fracture with mild angulation, unchanged from 12/30/2021. Electronically Signed   By: Davina Poke D.O.   On: 01/03/2022 15:26   MR  Cervical Spine Wo Contrast  Result Date: 12/30/2021 CLINICAL DATA:  Initial evaluation for acute trauma, fall. Neck pain. EXAM: MRI CERVICAL SPINE WITHOUT CONTRAST TECHNIQUE: Multiplanar, multisequence MR imaging of the  cervical spine was performed. No intravenous contrast was administered. COMPARISON:  Prior CT from earlier same day as well as previous exams. FINDINGS: Alignment: Straightening with slight reversal of the normal cervical lordosis. Degenerative grade 1 anterolisthesis of C4 on C5, C7 on T1, T1 on T2, and T2 on T3. trace retrolisthesis of C6 on C7. 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 Chronic C1 fractures better appreciated on prior CT. Mild chronic wedging deformity of the T1 vertebral body noted. No acute or recent fracture within the cervical spine. Bone marrow signal intensity within normal limits. No worrisome osseous lesions. No abnormal marrow edema. Cord: Normal signal and morphology. No evidence for traumatic cord injury. No findings to suggest acute ligamentous injury. Posterior Fossa, vertebral arteries, paraspinal tissues: Degenerative thickening about the dens and C1-2 articulations. Craniocervical junction otherwise unremarkable. Paraspinous soft tissues demonstrate no acute finding. Normal flow voids seen within the vertebral arteries bilaterally. Disc levels: C2-C3: Minimal annular disc bulge with right-sided uncovertebral spurring. Moderate bilateral facet hypertrophy. No canal or foraminal stenosis. C3-C4: Degenerative vertebral disc space narrowing with mild disc bulge. Right worse than left uncovertebral spurring. Moderate left worse than right facet arthrosis. No spinal stenosis. Mild left with moderate right C4 foraminal stenosis. C4-C5: Anterolisthesis. Disc bulge with bilateral uncovertebral spurring. Severe left with moderate right facet arthrosis with ankylosis. No spinal stenosis. Moderate left C5 foraminal narrowing. Right neural foramen remains patent. C5-C6: Degenerative disc space narrowing with circumferential disc osteophyte complex. C5 and C6 vertebral bodies are partially ankylosed. Moderate left facet arthrosis with associated ankylosis. Flattening and  partial effacement of the ventral thecal sac with minimal cord flattening, but no cord signal changes. No more than mild spinal stenosis. Severe left with moderate right C6 foraminal narrowing. C6-C7: Degenerative intervertebral disc space narrowing with circumferential disc osteophyte complex. Flattening and partial effacement of the ventral thecal sac. Mild facet hypertrophy. Resultant mild spinal stenosis. Moderate to severe left with moderate right C7 foraminal stenosis. C7-T1: Anterolisthesis. Disc desiccation without significant disc bulge. Bilateral facet hypertrophy. No canal or foraminal stenosis. T1-2: Diffuse disc bulge. Right worse than left facet hypertrophy. No significant canal or foraminal stenosis. IMPRESSION: 1. No acute traumatic injury within the cervical spine. 2. 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 Chronic C1 fractures better appreciated on prior CT. 3. Multilevel cervical spondylosis with resultant mild spinal stenosis at C5-6 and C6-7. 4. Multifactorial degenerative changes with resultant multilevel foraminal narrowing as above. Notable findings include moderate right C4 and left C5 foraminal stenosis, severe left with moderate right C6 foraminal narrowing, with moderate to severe left and moderate right C7 foraminal stenosis. Electronically Signed   By: Jeannine Boga M.D.   On: 12/30/2021 19:12   MR BRAIN WO CONTRAST  Result Date: 12/30/2021 CLINICAL DATA:  Initial evaluation for dizziness, fall. EXAM: MRI HEAD WITHOUT CONTRAST TECHNIQUE: Multiplanar, multiecho pulse sequences of the brain and surrounding structures were obtained without intravenous contrast. COMPARISON:  Prior CTs from earlier the same day as well as previous exams. FINDINGS: Brain: Cerebral volume within normal limits for age. Patchy T2/FLAIR hyperintensity involving the periventricular and deep white matter both cerebral hemispheres, most consistent with chronic microvascular ischemic disease. Few  scatter remote lacunar infarcts present about the basal ganglia and left thalamus. Small remote left cerebellar infarct.  Encephalomalacia and gliosis at the anterior right frontal lobe, consistent with prior traumatic injury. No acute or subacute infarct. Gray-white matter differentiation otherwise maintained. No acute intracranial hemorrhage. Single chronic microhemorrhage noted at the left corona radiata, of doubtful significance in isolation. No mass lesion, midline shift or mass effect. No hydrocephalus or extra-axial fluid collection. Pituitary gland and suprasellar region within normal limits. Vascular: Major intracranial vascular flow voids are maintained. Skull and upper cervical spine: Chronic C1 and dens fractures noted. Bone marrow signal intensity within normal limits. Evolving right frontal scalp contusion. Sinuses/Orbits: Prior bilateral ocular lens replacement. Globes orbital soft tissues demonstrate no other acute finding. Mild scattered mucosal thickening noted about the sphenoid ethmoidal sinuses. Small right with trace left mastoid effusions, of doubtful significance. Visualized nasopharynx unremarkable. Other: None. IMPRESSION: 1. No acute intracranial abnormality. 2. Evolving right frontal scalp contusion. 3. Encephalomalacia and gliosis at the anterior right frontal lobe, consistent with prior traumatic injury. 4. Underlying chronic microvascular ischemic disease with a few scattered remote lacunar infarcts about the basal ganglia and left thalamus, with additional small remote left cerebellar infarct. 5. Chronic C1 and dens fractures. Electronically Signed   By: Jeannine Boga M.D.   On: 12/30/2021 19:03   CT Head Wo Contrast  Result Date: 12/30/2021 CLINICAL DATA:  Trauma EXAM: CT HEAD WITHOUT CONTRAST CT CERVICAL SPINE WITHOUT CONTRAST TECHNIQUE: Multidetector CT imaging of the head and cervical spine was performed following the standard protocol without intravenous contrast.  Multiplanar CT image reconstructions of the cervical spine were also generated. RADIATION DOSE REDUCTION: This exam was performed according to the departmental dose-optimization program which includes automated exposure control, adjustment of the mA and/or kV according to patient size and/or use of iterative reconstruction technique. COMPARISON:  CT Head 02/11/20, CT C spine 02/11/20 FINDINGS: CT HEAD FINDINGS Brain: Apparent focal hypodensity in the right frontal lobe, adjacent to the site of soft tissue scalp injury, possibly posttraumatic. No evidence of hemorrhage. No extra-axial fluid collection. No hydrocephalus. Vascular: No hyperdense vessel or unexpected calcification. Skull: Soft tissue hematoma over the right frontal scalp/periorbital soft tissues. Severe degenerative changes of the right TMJ. Sinuses/Orbits: Bilateral lens replacements. There are frothy secretions in the left sphenoid sinus. Other: None CT CERVICAL SPINE FINDINGS Alignment: There is straightening of the normal cervical lordosis, unchanged from prior exam. There is grade 1 anterolisthesis C4 on C5, unchanged from prior exam. There is grade 1 anterolisthesis of C7 on T1, unchanged from prior exam. There is grade 1 anterolisthesis of T1 on T2, unchanged from prior exam. Skull base and vertebrae: Redemonstrated is a type 2 dens fracture mildly displaced fractures through the posterior arch of C1 bilaterally. Compared to prior exam from 2021 the dens fracture appears slightly more distracted, which may be due to reabsorption in the interval. Compared to prior exam there is slightly increased posterior angulation of the superior fracture fragment. There are multilevel severe facet degenerative changes with fusion of the facet joints at multiple cervical levels. Soft tissues and spinal canal: There are severe degenerative changes of the right glenohumeral joint. No evidence of an epidural hematoma. There is a soft tissue pannus at the C1-C2  level. Disc levels: There are multilevel degenerative disc bulges with likely least moderate spinal canal stenosis at the C6-C7 level. Upper chest: Negative. Other: None IMPRESSION: 1. Apparent focal hypodensity in the right frontal lobe, adjacent to the site of soft tissue scalp injury, possibly posttraumatic. No evidence of intracranial hemorrhage. 2. Redemonstrated is a type 2 dens  fracture, which appears slightly more distracted compared to prior exam from 2021 which may be due to reabsorption in the interval. Compared to prior exam there is slightly increased posterior angulation of the superior fracture fragment. 3. No definite acute cervical spine fracture is visualized. If the patient has myelopathic symptoms, further evaluation with a cervical spine MRI is recommended. 4. Multilevel degenerative disc bulges with likely least moderate spinal canal stenosis at the C6-C7 level. Electronically Signed   By: Marin Roberts M.D.   On: 12/30/2021 15:00   CT Cervical Spine Wo Contrast  Result Date: 12/30/2021 CLINICAL DATA:  Trauma EXAM: CT HEAD WITHOUT CONTRAST CT CERVICAL SPINE WITHOUT CONTRAST TECHNIQUE: Multidetector CT imaging of the head and cervical spine was performed following the standard protocol without intravenous contrast. Multiplanar CT image reconstructions of the cervical spine were also generated. RADIATION DOSE REDUCTION: This exam was performed according to the departmental dose-optimization program which includes automated exposure control, adjustment of the mA and/or kV according to patient size and/or use of iterative reconstruction technique. COMPARISON:  CT Head 02/11/20, CT C spine 02/11/20 FINDINGS: CT HEAD FINDINGS Brain: Apparent focal hypodensity in the right frontal lobe, adjacent to the site of soft tissue scalp injury, possibly posttraumatic. No evidence of hemorrhage. No extra-axial fluid collection. No hydrocephalus. Vascular: No hyperdense vessel or unexpected calcification.  Skull: Soft tissue hematoma over the right frontal scalp/periorbital soft tissues. Severe degenerative changes of the right TMJ. Sinuses/Orbits: Bilateral lens replacements. There are frothy secretions in the left sphenoid sinus. Other: None CT CERVICAL SPINE FINDINGS Alignment: There is straightening of the normal cervical lordosis, unchanged from prior exam. There is grade 1 anterolisthesis C4 on C5, unchanged from prior exam. There is grade 1 anterolisthesis of C7 on T1, unchanged from prior exam. There is grade 1 anterolisthesis of T1 on T2, unchanged from prior exam. Skull base and vertebrae: Redemonstrated is a type 2 dens fracture mildly displaced fractures through the posterior arch of C1 bilaterally. Compared to prior exam from 2021 the dens fracture appears slightly more distracted, which may be due to reabsorption in the interval. Compared to prior exam there is slightly increased posterior angulation of the superior fracture fragment. There are multilevel severe facet degenerative changes with fusion of the facet joints at multiple cervical levels. Soft tissues and spinal canal: There are severe degenerative changes of the right glenohumeral joint. No evidence of an epidural hematoma. There is a soft tissue pannus at the C1-C2 level. Disc levels: There are multilevel degenerative disc bulges with likely least moderate spinal canal stenosis at the C6-C7 level. Upper chest: Negative. Other: None IMPRESSION: 1. Apparent focal hypodensity in the right frontal lobe, adjacent to the site of soft tissue scalp injury, possibly posttraumatic. No evidence of intracranial hemorrhage. 2. Redemonstrated is a type 2 dens fracture, which appears slightly more distracted compared to prior exam from 2021 which may be due to reabsorption in the interval. Compared to prior exam there is slightly increased posterior angulation of the superior fracture fragment. 3. No definite acute cervical spine fracture is visualized. If  the patient has myelopathic symptoms, further evaluation with a cervical spine MRI is recommended. 4. Multilevel degenerative disc bulges with likely least moderate spinal canal stenosis at the C6-C7 level. Electronically Signed   By: Marin Roberts M.D.   On: 12/30/2021 15:00    DISCHARGE EXAMINATION: See progress note from earlier today  DISPOSITION: Home with home health  Discharge Instructions     Call MD for:  extreme fatigue   Complete by: As directed    Call MD for:  hives   Complete by: As directed    Call MD for:  persistant dizziness or light-headedness   Complete by: As directed    Call MD for:  persistant nausea and vomiting   Complete by: As directed    Call MD for:  severe uncontrolled pain   Complete by: As directed    Call MD for:  temperature >100.4   Complete by: As directed    Diet - low sodium heart healthy   Complete by: As directed    Discharge instructions   Complete by: As directed    Please take your medications as prescribed.  You were cared for by a hospitalist during your hospital stay. If you have any questions about your discharge medications or the care you received while you were in the hospital after you are discharged, you can call the unit and asked to speak with the hospitalist on call if the hospitalist that took care of you is not available. Once you are discharged, your primary care physician will handle any further medical issues. Please note that NO REFILLS for any discharge medications will be authorized once you are discharged, as it is imperative that you return to your primary care physician (or establish a relationship with a primary care physician if you do not have one) for your aftercare needs so that they can reassess your need for medications and monitor your lab values. If you do not have a primary care physician, you can call [phone] for a physician referral.   Increase activity slowly   Complete by: As directed            Allergies as of 01/10/2022       Reactions   Morphine    Other reaction(s): Other (See Comments) Apnea with large dose Respiratory Distress   Buprenorphine Hcl    Other reaction(s): Other (See Comments), Other (See Comments) Respiratory Distress Respiratory Distress   Codeine    Epinephrine    Other reaction(s): Other (See Comments) Severe anxiety; "makes my heart race" - would like to avoid during surgery   Morphine And Related Other (See Comments)   Respiratory Distress   Percocet [oxycodone-acetaminophen]         Medication List     STOP taking these medications    doxycycline 100 MG capsule Commonly known as: VIBRAMYCIN   famotidine 20 MG tablet Commonly known as: PEPCID   mupirocin ointment 2 % Commonly known as: BACTROBAN   naproxen 375 MG tablet Commonly known as: NAPROSYN       TAKE these medications    acetaminophen 500 MG tablet Commonly known as: TYLENOL Take 1,000 mg by mouth every 6 (six) hours as needed for mild pain or fever.   amLODipine 10 MG tablet Commonly known as: NORVASC Take 1 tablet (10 mg total) by mouth daily. What changed: Another medication with the same name was removed. Continue taking this medication, and follow the directions you see here.   B-12 1000 MCG Caps Take 1,000 mcg by mouth daily.   Biotin 5 MG Tabs Take 5 mg by mouth daily.   celecoxib 200 MG capsule Commonly known as: CELEBREX Take 200 mg by mouth every morning.   cephALEXin 500 MG capsule Commonly known as: KEFLEX Take 1 capsule (500 mg total) by mouth every 8 (eight) hours for 4 days.   cholecalciferol 25 MCG (1000 UNIT) tablet Commonly known as:  VITAMIN D3 Take 2,000 Units by mouth daily.   gabapentin 100 MG capsule Commonly known as: NEURONTIN Take 2 capsules (200 mg total) by mouth 3 (three) times daily.   hydrALAZINE 50 MG tablet Commonly known as: APRESOLINE Take 1 tablet (50 mg total) by mouth 3 (three) times daily. What  changed:  medication strength how much to take when to take this   hydrochlorothiazide 25 MG tablet Commonly known as: HYDRODIURIL Take 25 mg by mouth See admin instructions. Take 25 mg three times a week   irbesartan 300 MG tablet Commonly known as: AVAPRO Take 1 tablet (300 mg total) by mouth daily.   Oxycodone HCl 10 MG Tabs Take 0.5-1 tablets (5-10 mg total) by mouth every 8 (eight) hours as needed.   pantoprazole 40 MG tablet Commonly known as: PROTONIX Take 1 tablet (40 mg total) by mouth 2 (two) times daily. What changed: when to take this   phenazopyridine 100 MG tablet Commonly known as: PYRIDIUM Take 1 tablet (100 mg total) by mouth 3 (three) times daily with meals for 3 days.   sucralfate 1 g tablet Commonly known as: CARAFATE Take 1 g by mouth 2 (two) times daily.          Follow-up Information     Rusty Aus, MD. Schedule an appointment as soon as possible for a visit in 1 week(s).   Specialty: Internal Medicine Why: post hospitalization follow up Contact information: [place] [address] [place], [facility] Follow up.   Specialty: Home Health Services Contact information: [employer] [address] [place] [address] [phone]                 TOTAL DISCHARGE TIME: 58 minutes  [facility] Pager on [email or web address]  01/11/2022, 10:58 AM

## 2022-04-25 ENCOUNTER — Emergency Department: Payer: Medicare Other

## 2022-04-25 ENCOUNTER — Inpatient Hospital Stay: Payer: Medicare Other

## 2022-04-25 ENCOUNTER — Encounter (HOSPITAL_COMMUNITY): Payer: Self-pay | Admitting: Pulmonary Disease

## 2022-04-25 ENCOUNTER — Inpatient Hospital Stay
Admission: EM | Admit: 2022-04-25 | Discharge: 2022-04-25 | DRG: 065 | Disposition: A | Discharge status: Other Acute Inpatient Hospital | Payer: Medicare Other | Attending: Student in an Organized Health Care Education/Training Program | Admitting: Student in an Organized Health Care Education/Training Program

## 2022-04-25 ENCOUNTER — Encounter (HOSPITAL_COMMUNITY): Payer: Self-pay

## 2022-04-25 ENCOUNTER — Inpatient Hospital Stay (HOSPITAL_COMMUNITY)
Admission: EM | Admit: 2022-04-25 | Discharge: 2022-05-12 | DRG: 064 | Disposition: A | Discharge status: Skilled Nursing Facility | Payer: Medicare Other | Source: Other Acute Inpatient Hospital | Attending: Internal Medicine | Admitting: Internal Medicine

## 2022-04-25 DIAGNOSIS — E7800 Pure hypercholesterolemia, unspecified: Secondary | ICD-10-CM | POA: Diagnosis present

## 2022-04-25 DIAGNOSIS — Z853 Personal history of malignant neoplasm of breast: Secondary | ICD-10-CM

## 2022-04-25 DIAGNOSIS — D62 Acute posthemorrhagic anemia: Secondary | ICD-10-CM | POA: Insufficient documentation

## 2022-04-25 DIAGNOSIS — R29702 NIHSS score 2: Secondary | ICD-10-CM | POA: Diagnosis present

## 2022-04-25 DIAGNOSIS — R54 Age-related physical debility: Secondary | ICD-10-CM | POA: Diagnosis present

## 2022-04-25 DIAGNOSIS — K254 Chronic or unspecified gastric ulcer with hemorrhage: Secondary | ICD-10-CM | POA: Diagnosis not present

## 2022-04-25 DIAGNOSIS — I10 Essential (primary) hypertension: Secondary | ICD-10-CM | POA: Diagnosis not present

## 2022-04-25 DIAGNOSIS — K449 Diaphragmatic hernia without obstruction or gangrene: Secondary | ICD-10-CM | POA: Diagnosis not present

## 2022-04-25 DIAGNOSIS — N6313 Unspecified lump in the right breast, lower outer quadrant: Secondary | ICD-10-CM | POA: Diagnosis present

## 2022-04-25 DIAGNOSIS — K922 Gastrointestinal hemorrhage, unspecified: Secondary | ICD-10-CM | POA: Insufficient documentation

## 2022-04-25 DIAGNOSIS — Z8249 Family history of ischemic heart disease and other diseases of the circulatory system: Secondary | ICD-10-CM

## 2022-04-25 DIAGNOSIS — E871 Hypo-osmolality and hyponatremia: Secondary | ICD-10-CM | POA: Diagnosis not present

## 2022-04-25 DIAGNOSIS — R112 Nausea with vomiting, unspecified: Secondary | ICD-10-CM | POA: Diagnosis not present

## 2022-04-25 DIAGNOSIS — Z7401 Bed confinement status: Secondary | ICD-10-CM | POA: Diagnosis not present

## 2022-04-25 DIAGNOSIS — D72829 Elevated white blood cell count, unspecified: Secondary | ICD-10-CM | POA: Diagnosis not present

## 2022-04-25 DIAGNOSIS — R260 Ataxic gait: Secondary | ICD-10-CM | POA: Diagnosis present

## 2022-04-25 DIAGNOSIS — K317 Polyp of stomach and duodenum: Secondary | ICD-10-CM | POA: Diagnosis present

## 2022-04-25 DIAGNOSIS — I639 Cerebral infarction, unspecified: Secondary | ICD-10-CM

## 2022-04-25 DIAGNOSIS — F419 Anxiety disorder, unspecified: Secondary | ICD-10-CM | POA: Diagnosis not present

## 2022-04-25 DIAGNOSIS — I6502 Occlusion and stenosis of left vertebral artery: Secondary | ICD-10-CM | POA: Diagnosis present

## 2022-04-25 DIAGNOSIS — E876 Hypokalemia: Secondary | ICD-10-CM | POA: Diagnosis not present

## 2022-04-25 DIAGNOSIS — Z923 Personal history of irradiation: Secondary | ICD-10-CM

## 2022-04-25 DIAGNOSIS — I69398 Other sequelae of cerebral infarction: Secondary | ICD-10-CM | POA: Diagnosis not present

## 2022-04-25 DIAGNOSIS — K269 Duodenal ulcer, unspecified as acute or chronic, without hemorrhage or perforation: Secondary | ICD-10-CM | POA: Diagnosis not present

## 2022-04-25 DIAGNOSIS — R296 Repeated falls: Secondary | ICD-10-CM | POA: Diagnosis present

## 2022-04-25 DIAGNOSIS — K279 Peptic ulcer, site unspecified, unspecified as acute or chronic, without hemorrhage or perforation: Secondary | ICD-10-CM | POA: Insufficient documentation

## 2022-04-25 DIAGNOSIS — R278 Other lack of coordination: Secondary | ICD-10-CM | POA: Diagnosis present

## 2022-04-25 DIAGNOSIS — M1990 Unspecified osteoarthritis, unspecified site: Secondary | ICD-10-CM | POA: Diagnosis present

## 2022-04-25 DIAGNOSIS — M542 Cervicalgia: Secondary | ICD-10-CM | POA: Diagnosis present

## 2022-04-25 DIAGNOSIS — I951 Orthostatic hypotension: Secondary | ICD-10-CM | POA: Diagnosis not present

## 2022-04-25 DIAGNOSIS — I63542 Cerebral infarction due to unspecified occlusion or stenosis of left cerebellar artery: Principal | ICD-10-CM | POA: Diagnosis present

## 2022-04-25 DIAGNOSIS — I161 Hypertensive emergency: Secondary | ICD-10-CM | POA: Diagnosis present

## 2022-04-25 DIAGNOSIS — I63212 Cerebral infarction due to unspecified occlusion or stenosis of left vertebral arteries: Secondary | ICD-10-CM | POA: Diagnosis not present

## 2022-04-25 DIAGNOSIS — W19XXXA Unspecified fall, initial encounter: Secondary | ICD-10-CM | POA: Diagnosis present

## 2022-04-25 DIAGNOSIS — E872 Acidosis, unspecified: Secondary | ICD-10-CM | POA: Insufficient documentation

## 2022-04-25 DIAGNOSIS — R5381 Other malaise: Secondary | ICD-10-CM | POA: Diagnosis present

## 2022-04-25 DIAGNOSIS — R42 Dizziness and giddiness: Secondary | ICD-10-CM | POA: Diagnosis not present

## 2022-04-25 DIAGNOSIS — I6389 Other cerebral infarction: Secondary | ICD-10-CM | POA: Diagnosis not present

## 2022-04-25 DIAGNOSIS — K259 Gastric ulcer, unspecified as acute or chronic, without hemorrhage or perforation: Secondary | ICD-10-CM | POA: Diagnosis not present

## 2022-04-25 DIAGNOSIS — R338 Other retention of urine: Secondary | ICD-10-CM | POA: Diagnosis not present

## 2022-04-25 DIAGNOSIS — R339 Retention of urine, unspecified: Secondary | ICD-10-CM | POA: Diagnosis not present

## 2022-04-25 DIAGNOSIS — K264 Chronic or unspecified duodenal ulcer with hemorrhage: Secondary | ICD-10-CM | POA: Diagnosis not present

## 2022-04-25 DIAGNOSIS — Z7902 Long term (current) use of antithrombotics/antiplatelets: Secondary | ICD-10-CM | POA: Diagnosis not present

## 2022-04-25 DIAGNOSIS — R41841 Cognitive communication deficit: Secondary | ICD-10-CM | POA: Diagnosis present

## 2022-04-25 DIAGNOSIS — K2211 Ulcer of esophagus with bleeding: Secondary | ICD-10-CM | POA: Diagnosis not present

## 2022-04-25 DIAGNOSIS — R29810 Facial weakness: Secondary | ICD-10-CM | POA: Diagnosis present

## 2022-04-25 DIAGNOSIS — R5383 Other fatigue: Secondary | ICD-10-CM | POA: Diagnosis not present

## 2022-04-25 DIAGNOSIS — Z9181 History of falling: Secondary | ICD-10-CM

## 2022-04-25 DIAGNOSIS — K2210 Ulcer of esophagus without bleeding: Secondary | ICD-10-CM | POA: Diagnosis not present

## 2022-04-25 DIAGNOSIS — I63012 Cerebral infarction due to thrombosis of left vertebral artery: Secondary | ICD-10-CM | POA: Diagnosis not present

## 2022-04-25 DIAGNOSIS — G9341 Metabolic encephalopathy: Secondary | ICD-10-CM

## 2022-04-25 DIAGNOSIS — I63112 Cerebral infarction due to embolism of left vertebral artery: Secondary | ICD-10-CM | POA: Diagnosis not present

## 2022-04-25 DIAGNOSIS — D649 Anemia, unspecified: Secondary | ICD-10-CM | POA: Diagnosis not present

## 2022-04-25 HISTORY — DX: Cerebral infarction, unspecified: I63.9

## 2022-04-25 HISTORY — DX: Unspecified osteoarthritis, unspecified site: M19.90

## 2022-04-25 HISTORY — DX: Unspecified displaced fracture of first cervical vertebra, initial encounter for closed fracture: S12.000A

## 2022-04-25 HISTORY — DX: Malignant neoplasm of unspecified site of unspecified female breast: C50.919

## 2022-04-25 LAB — CBC WITH DIFFERENTIAL/PLATELET
Abs Immature Granulocytes: 0.37 10*3/uL — ABNORMAL HIGH (ref 0.00–0.07)
Basophils Absolute: 0.1 10*3/uL (ref 0.0–0.1)
Basophils Relative: 1 %
Eosinophils Absolute: 0.1 10*3/uL (ref 0.0–0.5)
Eosinophils Relative: 1 %
HCT: 43.9 % (ref 36.0–46.0)
Hemoglobin: 14.6 g/dL (ref 12.0–15.0)
Immature Granulocytes: 2 %
Lymphocytes Relative: 49 %
Lymphs Abs: 8.8 10*3/uL — ABNORMAL HIGH (ref 0.7–4.0)
MCH: 29.4 pg (ref 26.0–34.0)
MCHC: 33.3 g/dL (ref 30.0–36.0)
MCV: 88.3 fL (ref 80.0–100.0)
Monocytes Absolute: 0.9 10*3/uL (ref 0.1–1.0)
Monocytes Relative: 5 %
Neutro Abs: 7.5 10*3/uL (ref 1.7–7.7)
Neutrophils Relative %: 42 %
Platelets: 418 10*3/uL — ABNORMAL HIGH (ref 150–400)
RBC: 4.97 MIL/uL (ref 3.87–5.11)
RDW: 13.7 % (ref 11.5–15.5)
WBC: 17.8 10*3/uL — ABNORMAL HIGH (ref 4.0–10.5)
nRBC: 0 % (ref 0.0–0.2)

## 2022-04-25 LAB — URINALYSIS, ROUTINE W REFLEX MICROSCOPIC
Bacteria, UA: NONE SEEN
Bilirubin Urine: NEGATIVE
Glucose, UA: NEGATIVE mg/dL
Hgb urine dipstick: NEGATIVE
Ketones, ur: NEGATIVE mg/dL
Leukocytes,Ua: NEGATIVE
Nitrite: NEGATIVE
Protein, ur: 30 mg/dL — AB
Specific Gravity, Urine: 1.01 (ref 1.005–1.030)
pH: 7 (ref 5.0–8.0)

## 2022-04-25 LAB — LIPASE, BLOOD: Lipase: 43 U/L (ref 11–51)

## 2022-04-25 LAB — COMPREHENSIVE METABOLIC PANEL
ALT: 23 U/L (ref 0–44)
AST: 26 U/L (ref 15–41)
Albumin: 4.1 g/dL (ref 3.5–5.0)
Alkaline Phosphatase: 43 U/L (ref 38–126)
Anion gap: 12 (ref 5–15)
BUN: 30 mg/dL — ABNORMAL HIGH (ref 8–23)
CO2: 23 mmol/L (ref 22–32)
Calcium: 9.4 mg/dL (ref 8.9–10.3)
Chloride: 100 mmol/L (ref 98–111)
Creatinine, Ser: 0.65 mg/dL (ref 0.44–1.00)
GFR, Estimated: >60 mL/min (ref >60)
Glucose, Bld: 141 mg/dL — ABNORMAL HIGH (ref 70–99)
Potassium: 3.4 mmol/L — ABNORMAL LOW (ref 3.5–5.1)
Sodium: 135 mmol/L (ref 135–145)
Total Bilirubin: 0.9 mg/dL (ref 0.3–1.2)
Total Protein: 7 g/dL (ref 6.5–8.1)

## 2022-04-25 LAB — LACTIC ACID, PLASMA
Lactic Acid, Venous: 2.5 mmol/L (ref 0.5–1.9)
Lactic Acid, Venous: 2.5 mmol/L (ref 0.5–1.9)

## 2022-04-25 LAB — MRSA NEXT GEN BY PCR, NASAL: MRSA by PCR Next Gen: NOT DETECTED

## 2022-04-25 LAB — TROPONIN I (HIGH SENSITIVITY): Troponin I (High Sensitivity): 10 ng/L (ref <18)

## 2022-04-25 MED ORDER — ASPIRIN 81 MG PO CHEW: 324.0000 mg | CHEWABLE_TABLET | Freq: Once | ORAL | Status: DC

## 2022-04-25 MED ORDER — ASPIRIN 325 MG PO TABS
650.0000 mg | ORAL_TABLET | Freq: Once | ORAL | Status: AC
  Administered 2022-04-25: 325 mg via ORAL
  Filled 2022-04-25 (×2): qty 2

## 2022-04-25 MED ORDER — STROKE: EARLY STAGES OF RECOVERY BOOK: Freq: Once | Status: DC

## 2022-04-25 MED ORDER — NICARDIPINE HCL IN NACL 20-0.86 MG/200ML-% IV SOLN
3.0000 mg/h | INTRAVENOUS | Status: DC
  Administered 2022-04-25: 3 mg/h via INTRAVENOUS
  Filled 2022-04-25: qty 200

## 2022-04-25 MED ORDER — SODIUM CHLORIDE 0.9 % IV SOLN
2.0000 g | Freq: Once | INTRAVENOUS | Status: AC
  Administered 2022-04-25: 2 g via INTRAVENOUS
  Filled 2022-04-25: qty 12.5

## 2022-04-25 MED ORDER — CLOPIDOGREL BISULFATE 75 MG PO TABS
75.0000 mg | ORAL_TABLET | Freq: Every day | ORAL | Status: DC
  Administered 2022-04-26 – 2022-05-01 (×6): 75 mg via ORAL
  Filled 2022-04-25 (×6): qty 1

## 2022-04-25 MED ORDER — FENTANYL CITRATE PF 50 MCG/ML IJ SOSY
50.0000 ug | PREFILLED_SYRINGE | Freq: Once | INTRAMUSCULAR | Status: AC
  Administered 2022-04-25: 50 ug via INTRAVENOUS
  Filled 2022-04-25: qty 1

## 2022-04-25 MED ORDER — METRONIDAZOLE 500 MG/100ML IV SOLN: 500.0000 mg | Freq: Two times a day (BID) | INTRAVENOUS | Status: DC

## 2022-04-25 MED ORDER — CLOPIDOGREL BISULFATE 75 MG PO TABS
300.0000 mg | ORAL_TABLET | Freq: Once | ORAL | Status: AC
  Administered 2022-04-25: 300 mg via ORAL
  Filled 2022-04-25: qty 4

## 2022-04-25 MED ORDER — CLEVIDIPINE BUTYRATE 0.5 MG/ML IV EMUL
0.0000 mg/h | INTRAVENOUS | Status: DC
  Administered 2022-04-25: 2 mg/h via INTRAVENOUS
  Filled 2022-04-25: qty 50

## 2022-04-25 MED ORDER — PROCHLORPERAZINE EDISYLATE 10 MG/2ML IJ SOLN
10.0000 mg | Freq: Four times a day (QID) | INTRAMUSCULAR | Status: DC | PRN
  Administered 2022-04-25 – 2022-04-26 (×2): 10 mg via INTRAVENOUS
  Filled 2022-04-25 (×2): qty 2

## 2022-04-25 MED ORDER — ACETAMINOPHEN 325 MG PO TABS: 650.0000 mg | ORAL_TABLET | ORAL | Status: DC | PRN

## 2022-04-25 MED ORDER — SODIUM CHLORIDE 0.9 % IV BOLUS
500.0000 mL | Freq: Once | INTRAVENOUS | Status: AC
  Administered 2022-04-25: 500 mL via INTRAVENOUS

## 2022-04-25 MED ORDER — ONDANSETRON HCL 4 MG/2ML IJ SOLN
4.0000 mg | Freq: Four times a day (QID) | INTRAMUSCULAR | Status: DC | PRN
  Administered 2022-04-25 – 2022-04-27 (×3): 4 mg via INTRAVENOUS
  Filled 2022-04-25 (×3): qty 2

## 2022-04-25 MED ORDER — CLEVIDIPINE BUTYRATE 0.5 MG/ML IV EMUL
INTRAVENOUS | Status: AC
  Administered 2022-04-25: 50 mg
  Filled 2022-04-25: qty 100

## 2022-04-25 MED ORDER — ONDANSETRON HCL 4 MG/2ML IJ SOLN
4.0000 mg | Freq: Once | INTRAMUSCULAR | Status: AC
  Administered 2022-04-25: 4 mg via INTRAVENOUS
  Filled 2022-04-25: qty 2

## 2022-04-25 MED ORDER — LACTATED RINGERS IV BOLUS (SEPSIS): 1000.0000 mL | Freq: Once | INTRAVENOUS | Status: DC

## 2022-04-25 MED ORDER — IOHEXOL 350 MG/ML SOLN
100.0000 mL | Freq: Once | INTRAVENOUS | Status: AC | PRN
  Administered 2022-04-25: 100 mL via INTRAVENOUS

## 2022-04-25 MED ORDER — VANCOMYCIN HCL 1250 MG/250ML IV SOLN
1250.0000 mg | Freq: Once | INTRAVENOUS | Status: DC
  Filled 2022-04-25: qty 250

## 2022-04-25 MED ORDER — POLYETHYLENE GLYCOL 3350 17 G PO PACK
17.0000 g | PACK | Freq: Every day | ORAL | Status: DC | PRN
  Administered 2022-04-27 – 2022-05-02 (×3): 17 g via ORAL
  Filled 2022-04-25 (×4): qty 1

## 2022-04-25 MED ORDER — CHLORHEXIDINE GLUCONATE CLOTH 2 % EX PADS
6.0000 | MEDICATED_PAD | Freq: Every day | CUTANEOUS | Status: DC
  Administered 2022-04-25 – 2022-04-26 (×2): 6 via TOPICAL

## 2022-04-25 MED ORDER — ACETAMINOPHEN 325 MG RE SUPP: 650.0000 mg | RECTAL | Status: DC | PRN

## 2022-04-25 MED ORDER — CLEVIDIPINE BUTYRATE 0.5 MG/ML IV EMUL
0.0000 mg/h | INTRAVENOUS | Status: DC
  Administered 2022-04-26: 5 mg/h via INTRAVENOUS
  Administered 2022-04-26 (×2): 8 mg/h via INTRAVENOUS
  Filled 2022-04-25 (×3): qty 50

## 2022-04-25 MED ORDER — ACETAMINOPHEN 160 MG/5ML PO SOLN: 650.0000 mg | ORAL | Status: DC | PRN

## 2022-04-25 MED ORDER — LACTATED RINGERS IV SOLN: 150.0000 mL/h | INTRAVENOUS | Status: DC

## 2022-04-25 MED ORDER — DOCUSATE SODIUM 100 MG PO CAPS
100.0000 mg | ORAL_CAPSULE | Freq: Two times a day (BID) | ORAL | Status: DC | PRN
  Administered 2022-04-29: 100 mg via ORAL
  Filled 2022-04-25 (×2): qty 1

## 2022-04-25 MED ORDER — STROKE: EARLY STAGES OF RECOVERY BOOK
Freq: Once | Status: AC
  Filled 2022-04-25: qty 1

## 2022-04-25 MED ORDER — POTASSIUM CHLORIDE 10 MEQ/100ML IV SOLN: 10.0000 meq | INTRAVENOUS | Status: DC

## 2022-04-25 MED ORDER — ASPIRIN 81 MG PO TBEC
81.0000 mg | DELAYED_RELEASE_TABLET | Freq: Every day | ORAL | Status: DC
  Administered 2022-04-26 – 2022-05-02 (×7): 81 mg via ORAL
  Filled 2022-04-25 (×7): qty 1

## 2022-04-25 MED ORDER — ACETAMINOPHEN 325 MG PO TABS
650.0000 mg | ORAL_TABLET | Freq: Once | ORAL | Status: AC
  Administered 2022-04-25: 650 mg via ORAL
  Filled 2022-04-25: qty 2

## 2022-04-25 MED ORDER — SENNOSIDES-DOCUSATE SODIUM 8.6-50 MG PO TABS: 1.0000 | ORAL_TABLET | Freq: Every evening | ORAL | Status: DC | PRN

## 2022-04-25 NOTE — Consult Note (Signed)
Neurology Consultation  Reason for Consult: stroke Referring Physician: Dr Jori Moll  CC: headache, dizziness  History is obtained from:patient and chart  HPI: LYSSA OSORNO is a 89 y.o. female PMH of uncontrolled hypertension, presented to the emergency room for sudden onset of headache and just a general sensation of not feeling well upon awakening this morning.  She also noted dizziness which she cannot describe whether this is lightheadedness or room spinning.  She has very bad headache all over the back of her head.  She was evaluated by a noncontrasted head CT which was unremarkable for acute findings.  Was called to discuss the case after the CT angiogram revealed occlusion of the left vertebral artery, V2 level onwards with reconstitution proximal to the vertebrobasilar junction.  I was not advised of any neurological deficits and was told that the neurological exam had no ataxia and she did not complain of any dizziness.  I recommended an MRI brain be done which was completed and revealed a decent sized left cerebellar stroke with a possible small area of restricted diffusion in the right occipital lobe. The patient's blood pressures were in the 123456 to 123456 systolic.  I recommended controlling them to a level of at least 180 for right now. I got to see the patient after the MRI when I was called for formal consultation. She reports that she went to bed last night around 10 PM and woke up with the symptoms.  She has not had these symptoms before.  She has had symptoms with postconcussive headaches in the past.  She also has severe neck pain bilaterally.  When I asked her if she had any recent fall, she said she did-on Thursday when she fell.  Unclear details surrounding the fall.  She has had some neck discomfort since then but this neck pain is more severe now than it was before.   LKW: Possibly last night 10 PM IV thrombolysis given?: no, outside the window EVT: No-some T2 changes on the  cerebellum already Premorbid modified Rankin scale (mRS): 0   ROS: Full ROS was performed and is negative except as noted in the HPI.    Past Medical History:  Diagnosis Date   Actinic keratosis    Hypertension     Family History  Problem Relation Age of Onset   Hypertension Father     Social History:   reports that she has never smoked. She has never used smokeless tobacco. She reports current alcohol use. She reports that she does not use drugs.  Medications  Current Facility-Administered Medications:     stroke: early stages of recovery book, , Does not apply, Once, Cox, Amy N, DO   acetaminophen (TYLENOL) tablet 650 mg, 650 mg, Oral, Q4H PRN **OR** acetaminophen (TYLENOL) 160 MG/5ML solution 650 mg, 650 mg, Per Tube, Q4H PRN **OR** acetaminophen (TYLENOL) suppository 650 mg, 650 mg, Rectal, Q4H PRN, Cox, Amy N, DO   aspirin chewable tablet 324 mg, 324 mg, Oral, Once, Cox, Amy N, DO   clevidipine (CLEVIPREX) infusion 0.5 mg/mL, 0-21 mg/hr, Intravenous, Continuous, Mumma, Shannon, MD   nicardipine (CARDENE) '20mg'$  in 0.86% saline 276m IV infusion (0.1 mg/ml), 3-15 mg/hr, Intravenous, Continuous, Cox, Amy N, DO, Last Rate: 75 mL/hr at 04/25/22 1149, 7.5 mg/hr at 04/25/22 1149   senna-docusate (Senokot-S) tablet 1 tablet, 1 tablet, Oral, QHS PRN, Cox, Amy N, DO  Current Outpatient Medications:    metoprolol succinate (TOPROL-XL) 25 MG 24 hr tablet, Take 25 mg by mouth daily.,  Disp: , Rfl:    pramipexole (MIRAPEX) 0.125 MG tablet, Take by mouth., Disp: , Rfl:    acetaminophen (TYLENOL) 500 MG tablet, Take 1,000 mg by mouth every 6 (six) hours as needed for mild pain or fever., Disp: , Rfl:    amLODipine (NORVASC) 10 MG tablet, Take 1 tablet (10 mg total) by mouth daily., Disp: 30 tablet, Rfl: 2   Biotin 5 MG TABS, Take 5 mg by mouth daily., Disp: , Rfl:    celecoxib (CELEBREX) 200 MG capsule, Take 200 mg by mouth every morning., Disp: , Rfl:    cholecalciferol (VITAMIN D) 25 MCG  (1000 UNIT) tablet, Take 2,000 Units by mouth daily., Disp: , Rfl:    Cyanocobalamin (B-12) 1000 MCG CAPS, Take 1,000 mcg by mouth daily., Disp: , Rfl:    gabapentin (NEURONTIN) 100 MG capsule, Take 2 capsules (200 mg total) by mouth 3 (three) times daily., Disp: 180 capsule, Rfl: 1   hydrALAZINE (APRESOLINE) 50 MG tablet, Take 1 tablet (50 mg total) by mouth 3 (three) times daily., Disp: 90 tablet, Rfl: 2   hydrochlorothiazide (HYDRODIURIL) 25 MG tablet, Take 25 mg by mouth See admin instructions. Take 25 mg three times a week, Disp: , Rfl:    irbesartan (AVAPRO) 300 MG tablet, Take 1 tablet (300 mg total) by mouth daily., Disp: 30 tablet, Rfl: 1   Oxycodone HCl 10 MG TABS, Take 0.5-1 tablets (5-10 mg total) by mouth every 8 (eight) hours as needed. (Patient not taking: Reported on 04/25/2022), Disp: 15 tablet, Rfl: 0   pantoprazole (PROTONIX) 40 MG tablet, Take 1 tablet (40 mg total) by mouth 2 (two) times daily. (Patient taking differently: Take 40 mg by mouth daily.), Disp: 60 tablet, Rfl: 0   sucralfate (CARAFATE) 1 g tablet, Take 1 g by mouth 2 (two) times daily., Disp: , Rfl:   Exam: Current vital signs: BP (!) 254/174   Pulse 60   Resp 18   SpO2 100%  Vital signs in last 24 hours: Pulse Rate:  [60] 60 (02/25 0931) Resp:  [18] 18 (02/25 0931) BP: (254)/(174) 254/174 (02/25 0931) SpO2:  [99 %-100 %] 100 % (02/25 0931) General: Awake alert in some distress due to headache HEENT: Normocephalic atraumatic Lungs clear Cardiovascular: Regular rhythm Abdomen nondistended nontender Neurological exam Awake alert oriented x 3 No dysarthria No aphasia Cranial nerves: Pupils equal round react light, extract movements with nystagmus on leftward gaze, visual fields full, face symmetric, tongue and palate midline. Motor examination with no drift Sensation intact Coordination with left upper and lower extremity dysmetria NIH stroke scale-2 for dysmetria  Labs I have reviewed labs in epic  and the results pertinent to this consultation are:  CBC    Component Value Date/Time   WBC 17.8 (H) 04/25/2022 0948   RBC 4.97 04/25/2022 0948   HGB 14.6 04/25/2022 0948   HCT 43.9 04/25/2022 0948   PLT 418 (H) 04/25/2022 0948   MCV 88.3 04/25/2022 0948   MCH 29.4 04/25/2022 0948   MCHC 33.3 04/25/2022 0948   RDW 13.7 04/25/2022 0948   LYMPHSABS 8.8 (H) 04/25/2022 0948   MONOABS 0.9 04/25/2022 0948   EOSABS 0.1 04/25/2022 0948   BASOSABS 0.1 04/25/2022 0948    CMP     Component Value Date/Time   NA 135 04/25/2022 0948   K 3.4 (L) 04/25/2022 0948   CL 100 04/25/2022 0948   CO2 23 04/25/2022 0948   GLUCOSE 141 (H) 04/25/2022 0948   BUN 30 (H)  04/25/2022 0948   CREATININE 0.65 04/25/2022 0948   CALCIUM 9.4 04/25/2022 0948   PROT 7.0 04/25/2022 0948   ALBUMIN 4.1 04/25/2022 0948   AST 26 04/25/2022 0948   ALT 23 04/25/2022 0948   ALKPHOS 43 04/25/2022 0948   BILITOT 0.9 04/25/2022 0948   GFRNONAA >60 04/25/2022 0948   GFRAA >60 08/15/2017 [id number]   Imaging I have reviewed the images obtained:  CT-head-no acute changes CT angio head and neck: Occluded left vertebral artery in the V2 segment, new since November with reconstitution only at the vertebrobasilar junction.  This could be secondary to vertebral artery dissection or atherosclerosis or occlusion.  Little atherosclerosis in the neck but evidence of advanced intracranial atherosclerosis with widespread moderate and severe second and third branch stenosis-no discrete branch occlusion.  MRI examination of the brain: Acute left medial cerebellar infarction with possible small area of infarction in the right occipital lobe.  Absent flow void in the V4 segment.  Assessment: 89 year old woman with past history of hypertension presented to the emergency room for feeling of generalized malaise and weakness along with a headache that started upon awakening this morning.  She had a fall 2 days ago with some mild neck discomfort  but now also has extremely bad neck pain. CT head unremarkable but CT angio showed total occluded left vertebral artery P2 segment with distal reconstitution at the VBG with patent basilar. MRI with acute left medial cerebellar infarction with a possible small area of infarct in the right occipital lobe along with absent flow void in the V4 segment. Likely dissection versus clot in the left vertebral artery causing cerebellar infarction. There is some T2 sequence change in the medial cerebellum which precludes EVT.  She is outside the window for IV thrombolysis.  Of note, she presented in severe hypertension with systolic blood pressures in the 250-260 range-she has had some trouble with blood pressure management according to her husband.  There is also some concern for abnormal rhythm on telemetry and EKG shows PACs.  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  I am ordering the load here.  Maintenance doses will need to be ordered once she has a bed. Given the decent size of the stroke and dual antiplatelet load-recommend systolic blood pressure goal less than 180.  Blood pressure goal range-systolic 1 [id number] 80.  Avoid hypotension. Case has been discussed with neurointerventional radiology-Dr. Estanislado Pandy, who will be seeing the patient in consultation and will be doing an arteriogram likely Monday. Keep the patient n.p.o. past midnight 2D echo A1c Lipid panel PT OT Speech therapy Repeat head CT at 6 AM or if there is any neurological worsening to make sure that the cerebellar infarct edema is not causing any obstructive hydrocephalus Plan was discussed with Dr. Jori Moll,  EDP and patient and patient's husband at bedside. Plan was also discussed over the phone with neurointerventional radiology. Stroke team to follow  -- Amie Portland, MD Neurologist [facility] Pager: [phone]

## 2022-04-25 NOTE — ED Notes (Signed)
Pt verbally acknowledged and agreed to transfer to [facility].

## 2022-04-25 NOTE — Consult Note (Incomplete)
CODE SEPSIS - PHARMACY COMMUNICATION  **Broad Spectrum Antibiotics should be administered within 1 hour of Sepsis diagnosis**  Time Code Sepsis Called/Page Received: 1442  Antibiotics Ordered: vancomycin, cefepime, flagyl  Time of 1st antibiotic administration: delayed given stroke workup  Additional action taken by pharmacy: ***  If necessary, Name of Provider/Nurse Contacted: ***  Gretel Acre, PharmD PGY1 Pharmacy Resident 04/25/2022 3:01 PM

## 2022-04-25 NOTE — Consult Note (Addendum)
Neurology Consultation Reason for Consult: Cerebellar stroke Requesting Physician: Margaretha Seeds  CC: Dizziness  History is obtained from: Patient and chart review  HPI: Sherry [name] is a 89 y.o. female with past medical history significant for uncontrolled hypertension and recent fall.  She presented to [facility] ED for sudden onset headache and dizziness.  CT angiogram revealed extensive left vertebral artery occlusion with reconstitution just proximal to the vertebrobasilar junction.  MRI brain revealed a left cerebellar stroke.  Her blood pressures on arrival were in the 260s, and she required blood pressure drip   She did have a fall on Thursday with severe neck discomfort since then.  She is slightly disoriented on my evaluation and is not able to provide detailed history   LKW: 10 PM on 2/24 Thrombolytic given?: No, out of the window IA performed?: No, NIH too low and T2 changes in the cerebellum already Premorbid modified rankin scale:      0 - No symptoms.  ROS: Limited due to pain, nausea, slight disorientation  Past Medical History:  Diagnosis Date   Actinic keratosis    Hypertension    Past Surgical History:  Procedure Laterality Date   BACK SURGERY     IR INJECT/THERA/INC NEEDLE/CATH/PLC EPI/LUMB/SAC W/IMG  04/29/2021   MOHS SURGERY      Family History  Problem Relation Age of Onset   Hypertension Father     Social History:  reports that she has never smoked. She has never used smokeless tobacco. She reports current alcohol use. She reports that she does not use drugs.   Exam: Current vital signs: BP (!) 156/77   Pulse 94   Resp 17   SpO2 98%  Vital signs in last 24 hours: Temp:  [97.5 F (36.4 C)-97.6 F (36.4 C)] 97.6 F (36.4 C) (02/25 1620) Pulse Rate:  [60-104] 94 (02/25 1800) Resp:  [16-22] 17 (02/25 1800) BP: (156-254)/(61-174) 156/77 (02/25 1800) SpO2:  [94 %-100 %] 98 % (02/25 1800)   Physical Exam  Constitutional: Appears acutely  uncomfortable, nauseated Psych: Affect anxious  Eyes: No scleral injection HENT: No oropharyngeal obstruction.  MSK: no joint deformities.  Cardiovascular: Irregularly irregular rhythm but does appear sinus on the monitor  Respiratory: Effort normal, non-labored breathing GI: Soft.  No distension. There is no tenderness.  Skin: Warm dry and intact visible skin  Neuro: Mental Status: Patient is awake, alert, oriented to person, month, year, age, but reports she is in [facility] and does not know the name of the hospital she is then No signs of aphasia or neglect Cranial Nerves: II: Visual Fields are full. Pupils are equal, round, and reactive to light.   III,IV, VI: EOMI without ptosis or diploplia.  V: Facial sensation is symmetric to light touch VII: Facial movement is notable for a subtle right facial droop VIII: hearing is intact to voice X: Uvula elevates symmetrically XI: Shoulder shrug is symmetric antigravity. XII: tongue is midline without atrophy or fasciculations.  Motor: Tone is normal. Bulk is normal.  Formal strength testing deferred due to patient being acutely uncomfortable with cramping pain in her calves and severe neck pain.  However she did not have any pronator drift, and was able to maintain both legs antigravity for at least 5 seconds Sensory: Sensation is symmetric to light touch in the arms and legs Deep Tendon Reflexes: 3+ and symmetric in the brachioradialis and 4+ patellae (4- 6 beats of clonus bilaterally) Plantars: Toes are downgoing bilaterally.  Cerebellar: Ataxic  finger-nose and heel-to-shin on the left compared to the right.  She additionally has a physiologic appearing end reach tremor which she states is new Gait:  Deferred  NIHSS total 2 Score breakdown: Left upper and lower extremity ataxia Performed at 6:30 PM     I have reviewed labs in epic and the results pertinent to this consultation are:  Basic Metabolic Panel: Recent Labs  Lab  04/25/22 0948  NA 135  K 3.4*  CL 100  CO2 23  GLUCOSE 141*  BUN 30*  CREATININE 0.65  CALCIUM 9.4    CBC: Recent Labs  Lab 04/25/22 0948  WBC 17.8*  NEUTROABS 7.5  HGB 14.6  HCT 43.9  MCV 88.3  PLT 418*    Coagulation Studies: No results for input(s): "LABPROT", "INR" in the last 72 hours.   No results found for: "CHOL", "HDL", "LDLCALC", "LDLDIRECT", "TRIG", "CHOLHDL"  No results found for: "HGBA1C"   I have reviewed the images obtained:  CT-head-no acute changes  CT angio head and neck personally reviewed, agree with radiology:   1. 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 This could be secondary to vertebral artery dissection or atherosclerosis occlusion (see #2). 2. Little atherosclerosis in the neck, but evidence of Advanced Intracranial Atherosclerosis with widespread Moderate And Severe 2nd and 3rd COW branch stenoses. No discrete branch occlusion.   MRI examination of the brain personally reviewed, agree with radiology:  1. 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 2. Additional region of increased signal on diffusion-weighted imaging in the medial aspect of the right occipital lobe, favored to represent an additional site of an acute to subacute infarct. 3. Absent flow void in the V4 segment of the left vertebral artery, in keeping with known occlusion seen on same day CTA head/neck  Assessment: This is an 89 year old woman with a history of uncontrolled hypertension presenting with generalized weakness and severe headache and dizziness, found to have a left vertebral occlusion at the left cerebellar infarct  Impression:  -Left vertebral atheroembolic occlusion versus dissection -Hypertensive emergency -Sinus arrhythmia  Recommendations: -Loaded with aspirin and Plavix at [facility], dual antiplatelet therapy of aspirin 81 mg  and Plavix 75 mg ordered here, likely will need at least a 90-day course unless she is started on anticoagulation -Echocardiogram ordered -A1c and lipid panel ordered -Agree with blood pressure goal of 140-180 -N.P.O. -Plan for likely arteriogram with Dr. Estanislado Pandy on 2/26 -Repeat head CT at 5 AM, please obtain stat if there is a change in neurological status -PT/OT/SLP -Appreciate management of comorbidities per critical care team  Lesleigh Noe MD-PhD [facility] [phone] Available 7 AM to 7 PM, outside these hours please contact Neurologist on call listed on AMION    Total critical care time: 30 minutes   Critical care time was exclusive of separately billable procedures and treating other patients.   Critical care was necessary to treat or prevent imminent or life-threatening deterioration.   Critical care was time spent personally by me on the following activities: development of treatment plan with patient and/or surrogate as well as nursing, discussions with consultants/primary team, evaluation of patient's response to treatment, examination of patient, obtaining history from patient or surrogate, ordering and performing treatments and interventions, ordering and review of laboratory studies, ordering and review of radiographic studies, and re-evaluation of patient's condition as needed, as documented above.

## 2022-04-25 NOTE — ED Notes (Signed)
Advised nurse that patient has ready bed 

## 2022-04-25 NOTE — ED Notes (Signed)
PT transported to MRI with Tech

## 2022-04-25 NOTE — ED Notes (Signed)
CT called to transfer pt for Scan

## 2022-04-25 NOTE — Consult Note (Incomplete)
Pharmacy Antibiotic Note  Sherry [name] is a 89 y.o. female admitted on 04/25/2022 with sepsis.  Pharmacy has been consulted for vancomycin and cefepime dosing.  Plan: Day 1 of antibiotics Give vancomycin 1500 mg IV x1 followed by *** mg IV Q***H. Goal AUC 400-550. Expected AUC: *** Expected Css min: *** SCr used: ***  Weight used: IBW, Vd used: 0.72 (BMI ***) Start cefepime 2  g IV Q12H Patient is also on metronidazole *** {g/mg:28252} {IV/PO:28253} Q***H Continue to monitor renal function and follow culture results      No data recorded.  Recent Labs  Lab 04/25/22 0948 04/25/22 1148  WBC 17.8*  --   CREATININE 0.65  --   LATICACIDVEN 2.5* 2.5*    CrCl cannot be calculated (Unknown ideal weight.).    Allergies  Allergen Reactions   Morphine     Other reaction(s): Other (See Comments) Apnea with large dose Respiratory Distress    Buprenorphine Hcl     Other reaction(s): Other (See Comments), Other (See Comments) Respiratory Distress Respiratory Distress    Codeine    Epinephrine     Other reaction(s): Other (See Comments) Severe anxiety; "makes my heart race" - would like to avoid during surgery   Morphine And Related Other (See Comments)    Respiratory Distress    Percocet [Oxycodone-Acetaminophen]     Antimicrobials this admission: *** *** >> *** *** *** >> ***  Dose adjustments this admission: ***  Microbiology results: *** BCx: *** *** UCx: ***  *** Sputum: ***  *** MRSA PCR: ***  Thank you for allowing pharmacy to be a part of this patient's care.  [place] 04/25/2022 2:53 PM

## 2022-04-25 NOTE — ED Notes (Signed)
Med stopped sbp below 180

## 2022-04-25 NOTE — H&P (Addendum)
NAME:  Sherry [name], MRN:  [id number], DOB:  1930-09-09, LOS: 0 ADMISSION DATE:  04/25/2022, CONSULTATION DATE:  2/25 REFERRING MD:  Dr. Rory Percy , CHIEF COMPLAINT:  N/V, headache   History of Present Illness:  89 y/o F who presented to [facility] on 2/25 with reports of nausea, vomiting and headache.   The patient reports she has had several falls in the recent months. One in December were she suffered a concussion. She reportedly fell on Thursday while using her walker but is unsure what happended.  She had been doing well until the am of presentation when she woke with a headache, nausea and vomiting. She had dizziness.  She was last known well at 10pm the evening prior to admit. She was significantly hypertensive on presentation - [id number] systolic.  Emergent CT of the head was completed which was negative for acute intracranial findings. Subsequent CTA head showed a left vertebral artery occlusion. CTA chest aorta was negative for PE, showed a 19m mixed density nodule/mass in the right breast inferio-medial to surgical clips. Initial labs notable for K+ 3.4, glucose 141, BUN 30, cr 0.65, lactic acid 2.5, WBC 17.8, Hgb 14.6, and plaetelets 418.  Neurology was consulted who recommended MRI and SBP goal of 180.  MRI brain showed an acute infarct in the medial aspect of the left cerebellar hemisphere, increased signal on DWI in the medial aspect of the right occipital lobe, absent flow void in the V4 segment of the left vertebral artery.   [facility]   She reports her PCP took her off her home meds in December.  She does have a visit from 12/27 which shows an "end date" under the med profile and discontinued all HTN agents.  However, there is no mention of stopping her meds in the plan. She is a full code and does have a living will.    Pertinent  Medical History  HTN  C1 Fracture  SDH - 2019 Falls - concussion in December 2023  Breast Cancer s/p Lumpectomy, Radiation   Fibrocystic Breast Disease Motion Sickness  Osteoarthritis  PONV Squamous Cell Carcinoma   Significant Hospital Events: Including procedures, antibiotic start and stop dates in addition to other pertinent events   2/25 Admit with N/V, Headache.    Interim History / Subjective:  Reports headache, nausea   Objective   Blood pressure (!) 173/74, pulse 91, resp. rate 19, SpO2 97 %.       No intake or output data in the 24 hours ending 04/25/22 1851 There were no vitals filed for this visit.  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 MRI with acute left medial cerebellar infarction with a possible small area of infarct in the right occipital lobe along with absent flow void in the V4 segment. Likely dissection versus clot in the left vertebral artery causing cerebellar infarction. -per Neurology  -follow neuro exam closely  -assess ECHO, Hgb A1c, lipids -PT/OT/SLP evaluation  -  DAPT per Neuro -secondary stroke prevention measures  -IR aware of patients admission, likely angiogram on Monday -repeat CT Head 0600  Hypertensive Emergency Hx HTN -SBP control, goal 123XX123 systolic per Neurology -avoid hypotension  -hold home amlodipine, hydralazine, HCTZ, irbesartan, metoprolol > Pt reports her primary MD took her off all of her antihypertensives in December?? See above notes.   Nausea / Vomiting  -PRN zofran, compazine    R Breast Nodule / Mass Hx R Breast Cancer s/p Lumpectomy  -will need follow up with [facility] as outpatient after discharge for further evaluation    Best Practice (right click and "Reselect all SmartList Selections" daily)  Diet/type: NPO DVT prophylaxis: SCD GI prophylaxis: N/A Lines: N/A Foley:  N/A Code Status:  full code Last date of multidisciplinary goals of care discussion: patient reports she would want her husband or son to make decisions for her if she could not.    Labs   CBC: Recent Labs  Lab 04/25/22 0948  WBC 17.8*  NEUTROABS 7.5  HGB 14.6  HCT 43.9  MCV 88.3  PLT 418*    Basic Metabolic Panel: Recent Labs  Lab 04/25/22 0948  NA 135  K 3.4*  CL 100  CO2 23  GLUCOSE 141*  BUN 30*  CREATININE 0.65  CALCIUM 9.4   GFR: CrCl cannot be calculated (Unknown ideal weight.). Recent Labs  Lab 04/25/22 0948 04/25/22 1148  WBC 17.8*  --   LATICACIDVEN 2.5* 2.5*    Liver Function Tests: Recent Labs  Lab 04/25/22 0948  AST 26  ALT 23  ALKPHOS 43  BILITOT 0.9  PROT 7.0  ALBUMIN 4.1   Recent Labs  Lab 04/25/22 0948  LIPASE 43   No results for input(s): "AMMONIA" in the last 168 hours.  ABG No results found for: "PHART", "PCO2ART", "PO2ART", "HCO3", "TCO2", "ACIDBASEDEF", "O2SAT"   Coagulation Profile: No results for input(s): "INR", "PROTIME" in the last 168 hours.  Cardiac Enzymes: No results for input(s): "CKTOTAL", "CKMB", "CKMBINDEX", "TROPONINI" in the last 168 hours.  HbA1C: No results found for: "HGBA1C"  CBG: No results for input(s): "GLUCAP" in the last 168 hours.  Review of Systems: Positives in [place]  Gen: Denies fever, chills, weight change, fatigue, night sweats HEENT: Denies blurred vision, double vision, hearing loss, tinnitus, sinus congestion, rhinorrhea, sore throat, neck stiffness, dysphagia PULM: Denies shortness of breath, cough, sputum production, hemoptysis, wheezing CV: Denies chest pain, edema, orthopnea,  paroxysmal nocturnal dyspnea, palpitations GI: Denies abdominal pain, nausea, vomiting, diarrhea, hematochezia, melena, constipation, change in bowel habits GU: Denies dysuria, hematuria, polyuria, oliguria, urethral discharge Endocrine: Denies hot or cold intolerance, polyuria, polyphagia or appetite change Derm: Denies rash, dry skin, scaling or peeling skin change Heme: Denies easy bruising, bleeding, bleeding gums Neuro: Denies headache, dizziness, numbness, weakness, slurred speech, loss of memory or consciousness   Past Medical History:  She,  has a past medical history of Actinic keratosis and Hypertension.   Surgical History:   Past Surgical History:  Procedure Laterality Date   BACK SURGERY     IR INJECT/THERA/INC NEEDLE/CATH/PLC EPI/LUMB/SAC W/IMG  04/29/2021   MOHS SURGERY       Social History:   reports that she has never smoked. She has never used smokeless tobacco. She reports current alcohol use. She reports that she does not use drugs.   Family History:  Her family history includes Hypertension in her father.   Allergies Allergies  Allergen Reactions   Morphine     Other reaction(s): Other (  See Comments) Apnea with large dose Respiratory Distress    Buprenorphine Hcl     Other reaction(s): Other (See Comments), Other (See Comments) Respiratory Distress Respiratory Distress    Codeine    Epinephrine     Other reaction(s): Other (See Comments) Severe anxiety; "makes my heart race" - would like to avoid during surgery   Morphine And Related Other (See Comments)    Respiratory Distress    Percocet [Oxycodone-Acetaminophen]      Home Medications  Prior to Admission medications   Medication Sig Start Date End Date Taking? Authorizing Provider  acetaminophen (TYLENOL) 500 MG tablet Take 1,000 mg by mouth every 6 (six) hours as needed for mild pain or fever. Patient not taking: Reported on 04/25/2022    [provider]  amLODipine (NORVASC) 10 MG  tablet Take 1 tablet (10 mg total) by mouth daily. Patient not taking: Reported on 04/25/2022 01/10/22   Bonnielee Haff, MD  Biotin 5 MG TABS Take 5 mg by mouth daily. Patient not taking: Reported on 04/25/2022    [provider]  celecoxib (CELEBREX) 200 MG capsule Take 200 mg by mouth every morning. Patient not taking: Reported on 04/25/2022    [provider]  cholecalciferol (VITAMIN D) 25 MCG (1000 UNIT) tablet Take 2,000 Units by mouth daily. Patient not taking: Reported on 04/25/2022    [provider]  Cyanocobalamin (B-12) 1000 MCG CAPS Take 1,000 mcg by mouth daily. Patient not taking: Reported on 04/25/2022    [provider]  gabapentin (NEURONTIN) 100 MG capsule Take 2 capsules (200 mg total) by mouth 3 (three) times daily. Patient not taking: Reported on 04/25/2022 06/01/21   Nita Sells, MD  hydrALAZINE (APRESOLINE) 50 MG tablet Take 1 tablet (50 mg total) by mouth 3 (three) times daily. Patient not taking: Reported on 04/25/2022 01/10/22   Bonnielee Haff, MD  hydrochlorothiazide (HYDRODIURIL) 25 MG tablet Take 25 mg by mouth See admin instructions. Take 25 mg three times a week Patient not taking: Reported on 04/25/2022    [provider]  irbesartan (AVAPRO) 300 MG tablet Take 1 tablet (300 mg total) by mouth daily. Patient not taking: Reported on 04/25/2022 05/27/21   Annita Brod, MD  metoprolol succinate (TOPROL-XL) 25 MG 24 hr tablet Take 25 mg by mouth daily. Patient not taking: Reported on 04/25/2022 02/19/22   [provider]  Oxycodone HCl 10 MG TABS Take 0.5-1 tablets (5-10 mg total) by mouth every 8 (eight) hours as needed. Patient not taking: Reported on 04/25/2022 06/01/21   Nita Sells, MD  pantoprazole (PROTONIX) 40 MG tablet Take 1 tablet (40 mg total) by mouth 2 (two) times daily. Patient not taking: Reported on 04/25/2022 06/01/21   Nita Sells, MD  pramipexole (MIRAPEX) 0.125 MG tablet  Take by mouth. Patient not taking: Reported on 04/25/2022 04/08/22 04/08/23  [provider]  sucralfate (CARAFATE) 1 g tablet Take 1 g by mouth 2 (two) times daily. Patient not taking: Reported on 04/25/2022    [provider]     Critical care time: 34 minutes    Noe Gens, MSN, APRN, NP-C, AGACNP-BC [facility] 04/25/2022, 6:51 PM   Please see [email or web address] for pager details.   From 7A-7P if no response, please call [phone] After hours, please call ELink [phone]

## 2022-04-25 NOTE — Hospital Course (Signed)
Sherry [name] is an 89 year old female with history of CAD, aortic atherosclerosis, hypertension, hyperlipidemia, neuropathy, who presents emergency department for chief concerns of nausea, vomiting, headache.  Initial vitals in the emergency department showed temperature **   Respiration rate of 17, heart rate 72, blood pressure 254/174, SpO2 of 100% on room air.  Serum sodium is 135, potassium 3.4, chloride 100, bicarb 23, BUN of 30, serum creatinine 2.65, GFR greater than 60, nonfasting blood glucose 141, WBC 17.8, hemoglobin 14.6, platelets of 418.  ED treatment:

## 2022-04-25 NOTE — ED Notes (Signed)
MD at bedside. 

## 2022-04-25 NOTE — ED Triage Notes (Signed)
PT bib [facility] for vomiting and headache from home. Pt reports using a walker at home and fell hit head about a month ago. Pt actively vomiting, with tremors and HTN.  ED Provider ordered stat CT of head.

## 2022-04-25 NOTE — ED Notes (Signed)
Report called to Holland Commons, RN at [facility] ICU.

## 2022-04-25 NOTE — ED Provider Notes (Signed)
[facility] Provider Note    None    (approximate)   History   Nausea and Emesis (C/o headache)   HPI  Sherry [name] is a 89 y.o. female past medical history significant for hypertension, who presents to the emergency department with headache and vomiting.  Patient had a recent concussion.  Has been in her normal state of health and doing well this past week.  This morning woke up and was complaining of a headache and then had nausea and vomiting.  Complaining of a severe headache.  No recent falls or head trauma.  Is not on anticoagulation.  Denies any change in vision or chest pain.  Denies any extremity numbness or weakness.  Denies any room spinning dizziness.  Denies any falls or head trauma.  Also complaining of some neck pain.  Denies any chest pain.     Physical Exam   Triage Vital Signs: ED Triage Vitals  Enc Vitals Group     BP      Pulse      Resp      Temp      Temp src      SpO2      Weight      Height      Head Circumference      Peak Flow      Pain Score      Pain Loc      Pain Edu?      Excl. in [place]?     Most recent vital signs: Vitals:   04/25/22 1300 04/25/22 1500  BP: (!) 189/83   Pulse: 91   Resp: 20   Temp:  (!) 97.5 F (36.4 C)  SpO2: 99%     Physical Exam Constitutional:      Appearance: She is well-developed.  HENT:     Head: Atraumatic.     Mouth/Throat:     Mouth: Mucous membranes are moist.  Eyes:     Extraocular Movements: Extraocular movements intact.     Conjunctiva/sclera: Conjunctivae normal.     Pupils: Pupils are equal, round, and reactive to light.  Cardiovascular:     Rate and Rhythm: Rhythm irregular.  Pulmonary:     Effort: No respiratory distress.  Abdominal:     General: There is no distension.  Musculoskeletal:        General: No swelling. Normal range of motion.     Cervical back: Normal range of motion.  Skin:    General: Skin is warm.     Capillary Refill: Capillary refill  takes less than 2 seconds.  Neurological:     General: No focal deficit present.     Mental Status: She is alert. Mental status is at baseline.     Comments: Cranial nerves grossly intact.  5/5 strength bilateral upper and lower extremities.  Sensation intact in upper and lower extremities.  No dysmetria with finger-to-nose bilaterally.  Tremulous.     IMPRESSION / MDM / ASSESSMENT AND PLAN / ED COURSE  I reviewed the triage vital signs and the nursing notes.  Patient's NIH scale on arrival is 0.  Does not meet criteria for LVO.  No ongoing active dizziness or nystagmus at this time.  Patient significantly hypertensive.  Does not meet criteria for TNK.  Patient immediately taken back to CT scanner to evaluate for possible subarachnoid hemorrhage or intracranial bleed.  CT scan of the head independently reviewed with no signs of intracranial hemorrhage.  Read  as no acute findings.   EKG  I, Nathaniel Man, the attending physician, personally viewed and interpreted this ECG.   Rate: 75  Rhythm: Normal sinus  Axis: Normal  Intervals: Right bundle branch block with LVH.  QTc 502  ST&T Change: None No significant change when compared to prior EKG.  No tachycardic or bradycardic dysrhythmias while on cardiac telemetry.  RADIOLOGY I independently reviewed imaging, my interpretation of imaging: CT scan of the head without signs of intracranial hemorrhage or infarction.  Read as no acute findings.  Added on CTA and dissection study -discussed the finding with radiologist on-call for left vertebral artery occlusion, unable to determine if there was a dissection.  Patent right vertebral artery and basilar artery.  Recommended MR to evaluate for CVA and consultation to neurology.  LABS (all labs ordered are listed, but only abnormal results are displayed) Labs interpreted as -    Labs Reviewed  URINALYSIS, ROUTINE W REFLEX MICROSCOPIC - Abnormal; Notable for the following components:       Result Value   Color, Urine COLORLESS (*)    APPearance CLEAR (*)    Protein, ur 30 (*)    All other components within normal limits  COMPREHENSIVE METABOLIC PANEL - Abnormal; Notable for the following components:   Potassium 3.4 (*)    Glucose, Bld 141 (*)    BUN 30 (*)    All other components within normal limits  LACTIC ACID, PLASMA - Abnormal; Notable for the following components:   Lactic Acid, Venous 2.5 (*)    All other components within normal limits  LACTIC ACID, PLASMA - Abnormal; Notable for the following components:   Lactic Acid, Venous 2.5 (*)    All other components within normal limits  CBC WITH DIFFERENTIAL/PLATELET - Abnormal; Notable for the following components:   WBC 17.8 (*)    Platelets 418 (*)    Lymphs Abs 8.8 (*)    Abs Immature Granulocytes 0.37 (*)    All other components within normal limits  LIPASE, BLOOD  TROPONIN I (HIGH SENSITIVITY)    TREATMENT  Cardene infusion, IV fentanyl  MDM  Upon arrival patient was significant elevated of hypertension with blood pressure of 250/150.  After CT scan without signs of intracranial hemorrhage taken back to the room and continued to be significantly hypertensive.  No altered mental status.  Concerning for possible hypertensive emergency.  Started on Cardene infusion with a goal systolic blood pressure of 170.  Ordered CTA head and neck and chest to evaluate for possible dissection/occlusion.  3:32 PM  Received a phone call from radiology in [place] reading CTA.  Stated that there is an acute vertebral artery occlusion, unclear if it is from a dissection.  Basilar artery without signs of arterial sclerotic disease.  Stated would recommend MRI without contrast to further evaluate for infarct.  Discussed with Neurology, Dr. Rory Percy, recommended systolic blood pressure goal of 180.  Recommended if stopping Cardene switching to Cleviprex.  IV fluids and symptomatic treatment.  And stat MRI wo contrast.    3:32  PM  MRI with acute infarct.  Called and discussed with Dr. Rory Percy. -He called and discussed with IR at [facility] and with neurology.  Ultimately recommended transfer to [facility] ICU with plans for arteriogram nonurgently when she gets to [facility].  Updated family members.  Called and discussed with Dr. Tamala Julian in the ICU who accepted in transfer.  Patient will be transferred to [facility] for CVA with possible vertebral artery dissection versus  embolic event.  Marland Kitchen  PROCEDURES:  Critical Care performed: Yes  .Critical Care  Performed by: Nathaniel Man, MD Authorized by: Nathaniel Man, MD   Critical care provider statement:    Critical care time (minutes):  80   Critical care time was exclusive of:  Separately billable procedures and treating other patients   Critical care was necessary to treat or prevent imminent or life-threatening deterioration of the following conditions:  Circulatory failure   Critical care was time spent personally by me on the following activities:  Development of treatment plan with patient or surrogate, discussions with consultants, evaluation of patient's response to treatment, examination of patient, ordering and review of laboratory studies, ordering and review of radiographic studies, ordering and performing treatments and interventions, pulse oximetry, re-evaluation of patient's condition and review of old charts   Patient's presentation is most consistent with acute presentation with potential threat to life or bodily function.   MEDICATIONS ORDERED IN ED: Medications  nicardipine (CARDENE) '20mg'$  in 0.86% saline 24m IV infusion (0.1 mg/ml) (7.5 mg/hr Intravenous Rate/Dose Change 04/25/22 1149)   stroke: early stages of recovery book (has no administration in time range)  clevidipine (CLEVIPREX) infusion 0.5 mg/mL (4 mg/hr Intravenous Rate/Dose Change 04/25/22 1523)  metroNIDAZOLE (FLAGYL) IVPB 500 mg (has no administration in time range)  vancomycin (VANCOREADY) IVPB 1250  mg/250 mL (has no administration in time range)  ceFEPIme (MAXIPIME) 2 g in sodium chloride 0.9 % 100 mL IVPB (has no administration in time range)  aspirin tablet 650 mg (has no administration in time range)  clopidogrel (PLAVIX) tablet 300 mg (has no administration in time range)  sodium chloride 0.9 % bolus 500 mL (500 mLs Intravenous New Bag/Given 04/25/22 0937)  ondansetron (ZOFRAN) injection 4 mg (4 mg Intravenous Given 04/25/22 0938)  fentaNYL (SUBLIMAZE) injection 50 mcg (50 mcg Intravenous Given 04/25/22 0935)  fentaNYL (SUBLIMAZE) injection 50 mcg (50 mcg Intravenous Given 04/25/22 1128)  iohexol (OMNIPAQUE) 350 MG/ML injection 100 mL (100 mLs Intravenous Contrast Given 04/25/22 1108)  sodium chloride 0.9 % bolus 500 mL (500 mLs Intravenous New Bag/Given 04/25/22 1203)    FINAL CLINICAL IMPRESSION(S) / ED DIAGNOSES   Final diagnoses:  Hypertensive emergency  Cerebellar stroke ([place]     Rx / DC Orders   ED Discharge Orders     None        Note:  This document was prepared using Dragon voice recognition software and may include unintentional dictation errors.   MNathaniel Man MD 04/25/22 [phone]

## 2022-04-25 NOTE — Progress Notes (Signed)
SLP Cancellation Note  Patient Details Name: Sherry [name] MRN: [id number] DOB: [birth year]   Cancelled treatment:       Reason Eval/Treat Not Completed: Patient not medically ready   SLP consult received and appreciated. Noted pt with pending Neurology consult. Will defer cognitive-linguistic evaluation pending further medical work up.  Cherrie Gauze, M.S., [facility] [phone] ([place])   Quintella Baton 04/25/2022, 2:54 PM

## 2022-04-25 NOTE — Progress Notes (Signed)
[facility] Progress Note Patient Name: Sherry [name] DOB: Aug 19, 1930 MRN: [id number]   Date of Service  04/25/2022  HPI/Events of Note  Pain all over, head. Asking for pain meds. NPO from mid night.   Camera: Resting, SBP 146. Stable VS.   CVA-cerebellar stroke. [place] in AM.    eICU Interventions  Tylenol ordered oral once Discussed with RN     Intervention Category Intermediate Interventions: Pain - evaluation and management  Elmer Sow 04/25/2022, 11:42 PM

## 2022-04-26 ENCOUNTER — Inpatient Hospital Stay (HOSPITAL_COMMUNITY): Payer: Medicare Other

## 2022-04-26 DIAGNOSIS — I6389 Other cerebral infarction: Secondary | ICD-10-CM

## 2022-04-26 DIAGNOSIS — I639 Cerebral infarction, unspecified: Secondary | ICD-10-CM

## 2022-04-26 DIAGNOSIS — E871 Hypo-osmolality and hyponatremia: Secondary | ICD-10-CM | POA: Diagnosis not present

## 2022-04-26 DIAGNOSIS — I63012 Cerebral infarction due to thrombosis of left vertebral artery: Secondary | ICD-10-CM | POA: Diagnosis not present

## 2022-04-26 DIAGNOSIS — I161 Hypertensive emergency: Secondary | ICD-10-CM | POA: Diagnosis not present

## 2022-04-26 LAB — BASIC METABOLIC PANEL
Anion gap: 14 (ref 5–15)
BUN: 19 mg/dL (ref 8–23)
CO2: 22 mmol/L (ref 22–32)
Calcium: 8.9 mg/dL (ref 8.9–10.3)
Chloride: 97 mmol/L — ABNORMAL LOW (ref 98–111)
Creatinine, Ser: 0.64 mg/dL (ref 0.44–1.00)
GFR, Estimated: >60 mL/min (ref >60)
Glucose, Bld: 131 mg/dL — ABNORMAL HIGH (ref 70–99)
Potassium: 3.5 mmol/L (ref 3.5–5.1)
Sodium: 133 mmol/L — ABNORMAL LOW (ref 135–145)

## 2022-04-26 LAB — LIPID PANEL
Cholesterol: 242 mg/dL — ABNORMAL HIGH (ref 0–200)
Cholesterol: 233 mg/dL — ABNORMAL HIGH (ref 0–200)
HDL: 47 mg/dL (ref >40)
HDL: 53 mg/dL (ref >40)
LDL Cholesterol: UNDETERMINED mg/dL (ref 0–99)
LDL Cholesterol: 122 mg/dL — ABNORMAL HIGH (ref 0–99)
Total CHOL/HDL Ratio: 5.1 RATIO
Total CHOL/HDL Ratio: 4.4 RATIO
Triglycerides: 424 mg/dL — ABNORMAL HIGH (ref <150)
Triglycerides: 292 mg/dL — ABNORMAL HIGH (ref <150)
VLDL: UNDETERMINED mg/dL (ref 0–40)
VLDL: 58 mg/dL — ABNORMAL HIGH (ref 0–40)

## 2022-04-26 LAB — PROCALCITONIN: Procalcitonin: 0.1 ng/mL

## 2022-04-26 LAB — CBC
HCT: 40.8 % (ref 36.0–46.0)
Hemoglobin: 14 g/dL (ref 12.0–15.0)
MCH: 30 pg (ref 26.0–34.0)
MCHC: 34.3 g/dL (ref 30.0–36.0)
MCV: 87.6 fL (ref 80.0–100.0)
Platelets: 373 10*3/uL (ref 150–400)
RBC: 4.66 MIL/uL (ref 3.87–5.11)
RDW: 14 % (ref 11.5–15.5)
WBC: 17.5 10*3/uL — ABNORMAL HIGH (ref 4.0–10.5)
nRBC: 0 % (ref 0.0–0.2)

## 2022-04-26 LAB — PROTIME-INR
INR: 0.9 (ref 0.8–1.2)
Prothrombin Time: 12.3 seconds (ref 11.4–15.2)

## 2022-04-26 LAB — ECHOCARDIOGRAM COMPLETE
AR max vel: 3.41 cm2
AV Area VTI: 3.65 cm2
AV Area mean vel: 3.39 cm2
AV Mean grad: 7 mmHg
AV Peak grad: 12.8 mmHg
Ao pk vel: 1.79 m/s
Area-P 1/2: 2.48 cm2
MV VTI: 3.05 cm2
S' Lateral: 1.7 cm
Weight: 1982.38 oz

## 2022-04-26 LAB — LDL CHOLESTEROL, DIRECT: Direct LDL: 114 mg/dL — ABNORMAL HIGH (ref 0–99)

## 2022-04-26 LAB — MAGNESIUM: Magnesium: 1.4 mg/dL — ABNORMAL LOW (ref 1.7–2.4)

## 2022-04-26 LAB — PHOSPHORUS: Phosphorus: 3 mg/dL (ref 2.5–4.6)

## 2022-04-26 LAB — LACTIC ACID, PLASMA: Lactic Acid, Venous: 2 mmol/L (ref 0.5–1.9)

## 2022-04-26 MED ORDER — KETOROLAC TROMETHAMINE 15 MG/ML IJ SOLN
15.0000 mg | Freq: Three times a day (TID) | INTRAMUSCULAR | Status: AC | PRN
  Administered 2022-04-26 – 2022-04-27 (×3): 15 mg via INTRAVENOUS
  Filled 2022-04-26 (×3): qty 1

## 2022-04-26 MED ORDER — METOPROLOL SUCCINATE ER 25 MG PO TB24
25.0000 mg | ORAL_TABLET | Freq: Every day | ORAL | Status: DC
  Administered 2022-04-26 – 2022-05-11 (×16): 25 mg via ORAL
  Filled 2022-04-26 (×16): qty 1

## 2022-04-26 MED ORDER — HYDRALAZINE HCL 50 MG PO TABS
50.0000 mg | ORAL_TABLET | Freq: Three times a day (TID) | ORAL | Status: DC
  Administered 2022-04-26 – 2022-05-01 (×13): 50 mg via ORAL
  Filled 2022-04-26 (×14): qty 1

## 2022-04-26 MED ORDER — AMLODIPINE BESYLATE 5 MG PO TABS
5.0000 mg | ORAL_TABLET | Freq: Every day | ORAL | Status: DC
  Administered 2022-04-26 – 2022-04-28 (×3): 5 mg via ORAL
  Filled 2022-04-26 (×3): qty 1

## 2022-04-26 MED ORDER — HYDRALAZINE HCL 20 MG/ML IJ SOLN: 10.0000 mg | INTRAMUSCULAR | Status: DC | PRN

## 2022-04-26 MED ORDER — MAGNESIUM SULFATE 4 GM/100ML IV SOLN
4.0000 g | Freq: Once | INTRAVENOUS | Status: AC
  Administered 2022-04-26: 4 g via INTRAVENOUS
  Filled 2022-04-26: qty 100

## 2022-04-26 MED ORDER — ENOXAPARIN SODIUM 40 MG/0.4ML IJ SOSY
40.0000 mg | PREFILLED_SYRINGE | INTRAMUSCULAR | Status: DC
  Administered 2022-04-26 – 2022-05-01 (×6): 40 mg via SUBCUTANEOUS
  Filled 2022-04-26 (×6): qty 0.4

## 2022-04-26 MED ORDER — HYDRALAZINE HCL 20 MG/ML IJ SOLN
10.0000 mg | Freq: Four times a day (QID) | INTRAMUSCULAR | Status: DC | PRN
  Administered 2022-04-27: 10 mg via INTRAVENOUS
  Filled 2022-04-26: qty 1

## 2022-04-26 MED ORDER — HYDRALAZINE HCL 20 MG/ML IJ SOLN: 10.0000 mg | Freq: Four times a day (QID) | INTRAMUSCULAR | Status: DC | PRN

## 2022-04-26 NOTE — Evaluation (Addendum)
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 PMH includes: uncontrolled HTN, and recent fall with concussion.   Clinical Impression  Pt in bed upon arrival of PT, agreeable to evaluation at this time. Prior to admission the pt was independent, using RW for ambulation but able to complete ADLs without assist. The pt now presents with limitations in functional mobility, strength, activity tolerance, balance, and endurance due to above dx, and will continue to benefit from skilled PT to address these deficits. The pt required mod-maxA of 2 to complete bed mobility, and was unable to tolerate sitting EOB for more than 2 min due to onset of dizziness and decreased consciousness with BP elevation outside of set parameters. She was able to move BLE against gravity and to assist with bed mobility. Given prior level of function and current levels of assist needed, recommend continued therapy acutely as well as acute inpatient rehab when medically stable for d/c to return to prior level of function and independence.    BP in supine: 174/65 (96) BP post-sitting EOB: 186/78 (108) *pt reports dizziness BP in supine: 193/70 (104) *RN alerted   Recommendations for follow up therapy are one component of a multi-disciplinary discharge planning process, led by the attending physician.  Recommendations may be updated based on patient status, additional functional criteria and insurance authorization.  Follow Up Recommendations Acute inpatient rehab (3hours/day)      Assistance Recommended at Discharge Frequent or constant Supervision/Assistance  Patient can return  home with the following  Two people to help with walking and/or transfers;Two people to help with bathing/dressing/bathroom;Assistance with cooking/housework;Direct supervision/assist for medications management;Assistance with feeding;Direct supervision/assist for financial management;Assist for transportation;Help with stairs or ramp for entrance    Equipment Recommendations Other (comment) (defer to post acute)  Recommendations for Other Services  Rehab consult    Functional Status Assessment Patient has had a recent decline in their functional status and demonstrates the ability to make significant improvements in function in a reasonable and predictable amount of time.     Precautions / Restrictions Precautions Precautions: Fall;Other (comment) Precaution Comments: BP 140-180 Restrictions Weight Bearing Restrictions: No      Mobility  Bed Mobility Overal bed mobility: Needs Assistance Bed Mobility: Rolling, Sidelying to Sit, Sit to Supine Rolling: Mod assist, +2 for physical assistance Sidelying to sit: Mod assist, +2 for physical assistance   Sit to supine: Max assist, +2 for physical assistance   General bed mobility comments: pt able to initiate with movement of LE, needed assist at trunk to roll and elevate to sitting. Pt dizzy upon sitting EOB and required increased assist to return to supine.    Transfers                   General transfer comment: deferred due to dizziness sitting EOB with BP elevation outside parameter    Modified Rankin (Stroke Patients Only) Modified Rankin (Stroke Patients Only) Pre-Morbid Rankin Score: No symptoms Modified Rankin: Severe disability     Balance Overall balance assessment: Needs assistance Sitting-balance support: Bilateral upper extremity supported, Feet supported Sitting balance-Leahy Scale: Poor Sitting balance - Comments: pt dependent on therapist to maintain sitting, progressive  dizziness further limiting  independence                                     Pertinent Vitals/Pain      Home Living Family/patient expects to be discharged to:: Private residence Living Arrangements: Spouse/significant other Available Help at Discharge: Family;Available 24 hours/day Type of Home: House Home Access: Level entry       Home Layout: One level Home Equipment: Conservation officer, nature (2 wheels);Shower seat;Grab bars - tub/shower      Prior Function Prior Level of Function : Needs assist;History of Falls (last six months)             Mobility Comments: uses RW for mobility, recent fall ADLs Comments: independent with ADLs, cooking in kitchen. still driving     Hand Dominance   Dominant Hand: Right    Extremity/Trunk Assessment   Upper Extremity Assessment Upper Extremity Assessment: Defer to OT evaluation    Lower Extremity Assessment Lower Extremity Assessment: RLE deficits/detail;LLE deficits/detail RLE Deficits / Details: pt denies sensory change, able to move briskly in response to cues and against gravitiy for partial ROM, not able to move against resistance. RLE Sensation: WNL (per patient) LLE Deficits / Details: pt denies sensory change, able to move briskly in response to cues and against gravitiy for partial ROM, not able to move against resistance. LLE Sensation: WNL (pt denies sensory change`)    Cervical / Trunk Assessment Cervical / Trunk Assessment: Kyphotic  Communication   Communication: No difficulties (soft spoken)  Cognition Arousal/Alertness: Awake/alert Behavior During Therapy: Flat affect Overall Cognitive Status: No family/caregiver present to determine baseline cognitive functioning                                 General Comments: pt generally slow to respond and perseverating on thirst, able to follow cues and answer simple questions appropriately. easily distracted. no family present at time of evaluation        General  Comments General comments (skin integrity, edema, etc.): L arm red with small red dots, BP cuff moved to R arm    Exercises     Assessment/Plan    PT Assessment Patient needs continued PT services  PT Problem List Decreased strength;Decreased activity tolerance;Decreased mobility;Decreased balance;Cardiopulmonary status limiting activity;Obesity       PT Treatment Interventions DME instruction;Gait training;Functional mobility training;Therapeutic activities;Therapeutic exercise;Balance training;Neuromuscular re-education;Patient/family education    PT Goals (Current goals can be found in the Care Plan section)  Acute Rehab PT Goals Patient Stated Goal: to feel better PT Goal Formulation: With patient Time For Goal Achievement: 05/10/22 Potential to Achieve Goals: Good    Frequency Min 4X/week     Co-evaluation PT/OT/SLP Co-Evaluation/Treatment: Yes Reason for Co-Treatment: Complexity of the patient's impairments (multi-system involvement);Necessary to address cognition/behavior during functional activity;For patient/therapist safety;To address functional/ADL transfers PT goals addressed during session: Mobility/safety with mobility;Balance;Strengthening/ROM         AM-PAC PT "6 Clicks" Mobility  Outcome Measure Help needed turning from your back to your side while in a flat bed without using bedrails?: Total Help needed moving from lying on your back to sitting on the side of a flat bed without using bedrails?: Total Help needed moving to and from a bed to a chair (including a wheelchair)?: Total Help needed standing up from a chair using your arms (  e.g., wheelchair or bedside chair)?: Total Help needed to walk in hospital room?: Total Help needed climbing 3-5 steps with a railing? : Total 6 Click Score: 6    End of Session   Activity Tolerance: Patient limited by fatigue;Patient limited by pain;Other (comment) (dizziness with sitting, BP elevation, headache) Patient  left: in bed;with call bell/phone within reach;with bed alarm set Nurse Communication: Mobility status;Other (comment) (BP elevation) PT Visit Diagnosis: Other abnormalities of gait and mobility (R26.89);Muscle weakness (generalized) (M62.81);History of falling (Z91.81);Difficulty in walking, not elsewhere classified (R26.2)    Time: [id number] PT Time Calculation (min) (ACUTE ONLY): 24 min   Charges:   PT Evaluation $PT Eval Moderate Complexity: 1 Mod          West Carbo, PT, DPT   Acute Rehabilitation Department Office [phone] Secure Chat Communication Preferred  Sandra Cockayne 04/26/2022, 10:03 AM

## 2022-04-26 NOTE — Consult Note (Incomplete)
Chief Complaint: Patient was seen in consultation today for left vertebral artery occlusion  Referring Physician(s): [place]  Supervising Physician: Luanne Bras  Patient Status: [facility]  History of Present Illness: Sherry [name] is a 89 y.o. female with a past medical history significant for uncontrolled HTN, breast cancer who presented to [facility] ED 2/25 with complaints of sudden onset headache and dizziness. She underwent CTA head/neck which showed:  1. 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 This could be secondary to vertebral artery dissection or atherosclerosis occlusion (see #2).   2. Little atherosclerosis in the neck, but evidence of Advanced Intracranial Atherosclerosis with widespread Moderate And Severe 2nd and 3rd COW branch stenoses. No discrete branch occlusion.   Study discussed by telephone with Dr. Nathaniel Man on 04/25/2022 at 11:38. She advises an NIH stroke scale of zero, but unexplained vomiting. We discussed Neurology consultation and that follow-up noncontrast Brain MRI to evaluate for occult brain ischemia might be valuable for treatment planning.  MRI brain showed:  1. 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 2. Additional region of increased signal on diffusion-weighted imaging in the medial aspect of the right occipital lobe, favored to represent an additional site of an acute to subacute infarct. 3. Absent flow void in the V4 segment of the left vertebral artery, in keeping with known occlusion seen on same day CTA head/neck  Neurology was consulted and she has been transferred to [facility] for further evaluation. NIR has been consulted for a diagnostic cerebral angiogram to further evaluate the left vertebral artery occlusion.  Patient seen at bedside, no family or staff present. She reports headache all  over, dizziness and being nauseous. She is unsure if she feels light headed or if the room feels like it's spinning but she thinks it's more like room spinning. She lives with her husband and is independent of ADLs normally.   Past Medical History:  Diagnosis Date   Actinic keratosis    Breast cancer ([place])    C1 cervical fracture (HCC)    Hypertension    Osteoarthritis     Past Surgical History:  Procedure Laterality Date   BACK SURGERY     IR INJECT/THERA/INC NEEDLE/CATH/PLC EPI/LUMB/SAC W/IMG  04/29/2021   MOHS SURGERY      Allergies: Morphine, Buprenorphine hcl, Codeine, Epinephrine, Morphine and related, and Percocet [oxycodone-acetaminophen]  Medications: Prior to Admission medications   Medication Sig Start Date End Date Taking? Authorizing Provider  acetaminophen (TYLENOL) 500 MG tablet Take 1,000 mg by mouth every 6 (six) hours as needed for mild pain or fever. Patient not taking: Reported on 04/25/2022    [provider]  amLODipine (NORVASC) 10 MG tablet Take 1 tablet (10 mg total) by mouth daily. Patient not taking: Reported on 04/25/2022 01/10/22   Bonnielee Haff, MD  Biotin 5 MG TABS Take 5 mg by mouth daily. Patient not taking: Reported on 04/25/2022    [provider]  celecoxib (CELEBREX) 200 MG capsule Take 200 mg by mouth every morning. Patient not taking: Reported on 04/25/2022    [provider]  cholecalciferol (VITAMIN D) 25 MCG (1000 UNIT) tablet Take 2,000 Units by mouth daily. Patient not taking: Reported on 04/25/2022    [provider]  Cyanocobalamin (B-12) 1000 MCG CAPS Take 1,000 mcg by mouth daily. Patient not taking: Reported on 04/25/2022    [provider]  gabapentin (  NEURONTIN) 100 MG capsule Take 2 capsules (200 mg total) by mouth 3 (three) times daily. Patient not taking: Reported on 04/25/2022 06/01/21   Nita Sells, MD  hydrALAZINE (APRESOLINE) 50 MG tablet Take 1 tablet (50 mg total) by mouth 3  (three) times daily. Patient not taking: Reported on 04/25/2022 01/10/22   Bonnielee Haff, MD  hydrochlorothiazide (HYDRODIURIL) 25 MG tablet Take 25 mg by mouth See admin instructions. Take 25 mg three times a week Patient not taking: Reported on 04/25/2022    [provider]  irbesartan (AVAPRO) 300 MG tablet Take 1 tablet (300 mg total) by mouth daily. Patient not taking: Reported on 04/25/2022 05/27/21   Annita Brod, MD  metoprolol succinate (TOPROL-XL) 25 MG 24 hr tablet Take 25 mg by mouth daily. Patient not taking: Reported on 04/25/2022 02/19/22   [provider]  Oxycodone HCl 10 MG TABS Take 0.5-1 tablets (5-10 mg total) by mouth every 8 (eight) hours as needed. Patient not taking: Reported on 04/25/2022 06/01/21   Nita Sells, MD  pantoprazole (PROTONIX) 40 MG tablet Take 1 tablet (40 mg total) by mouth 2 (two) times daily. Patient not taking: Reported on 04/25/2022 06/01/21   Nita Sells, MD  pramipexole (MIRAPEX) 0.125 MG tablet Take by mouth. Patient not taking: Reported on 04/25/2022 04/08/22 04/08/23  [provider]  sucralfate (CARAFATE) 1 g tablet Take 1 g by mouth 2 (two) times daily. Patient not taking: Reported on 04/25/2022    [provider]     Family History  Problem Relation Age of Onset   Hypertension Father     Social History   Socioeconomic History   Marital status: Married    Spouse name: Not on file   Number of children: Not on file   Years of education: Not on file   Highest education level: Not on file  Occupational History   Not on file  Tobacco Use   Smoking status: Never   Smokeless tobacco: Never  Substance and Sexual Activity   Alcohol use: Yes    Comment: Occassionally   Drug use: No   Sexual activity: Not on file  Other Topics Concern   Not on file  Social History Narrative   Not on file   Social Determinants of Health   Financial Resource Strain: Not on file  Food Insecurity: No  Food Insecurity (01/07/2022)   Hunger Vital Sign    Worried About Running Out of Food in the Last Year: Never true    Ran Out of Food in the Last Year: Never true  Transportation Needs: No Transportation Needs (01/07/2022)   PRAPARE - Hydrologist (Medical): No    Lack of Transportation (Non-Medical): No  Physical Activity: Not on file  Stress: Not on file  Social Connections: Not on file     Review of Systems: A 12 point ROS discussed and pertinent positives are indicated in the HPI above.  All other systems are negative.  Review of Systems  Constitutional:  Positive for fatigue. Negative for fever.  Respiratory:  Negative for cough and shortness of breath.   Cardiovascular:  Negative for chest pain.  Gastrointestinal:  Positive for nausea. Negative for abdominal pain.  Skin:  Positive for rash (new rash on left forearm; no itchy, started yesterday).  Neurological:  Positive for dizziness, tremors (new right hand tremor, started yesterday) and headaches.    Vital Signs: BP (!) 169/63 (BP Location: Left Arm)   Pulse 91  Temp (!) 97.3 F (36.3 C) (Oral)   Resp 19   Wt 123 lb 14.4 oz (56.2 kg)   SpO2 97%   BMI 22.66 kg/m   Physical Exam Vitals and nursing note reviewed.  Constitutional:      General: She is not in acute distress. HENT:     Head: Normocephalic.     Mouth/Throat:     Mouth: Mucous membranes are moist.     Pharynx: Oropharynx is clear. No oropharyngeal exudate or posterior oropharyngeal erythema.  Cardiovascular:     Rate and Rhythm: Normal rate and regular rhythm.  Pulmonary:     Effort: Pulmonary effort is normal.     Breath sounds: Normal breath sounds.  Skin:    General: Skin is warm and dry.     Findings: Rash (multiple petechiae appearing spots on left forearm only; no itching, no painful, does not blanche when touched, no open wounsd or bleeding) present.  Neurological:     Mental Status: She is alert.     Comments:  (+) right hand tremor, worse with intention   Alert, awake, and oriented to name, birthday, and hospital - unsure of date but knows it's February Speech and comprehension in tact. HOH. PERRL bilaterally EOMs without nystagmus or subjective diplopia. Visual fields grossly in tact No facial asymmetry. Tongue midline Motor power - 3/5 left upper extremity, 5/5 RUE, LLE, RLE Negative pronator drift. Fine motor and coordination - significant zig zagging with left hand during finger to nose, minimal zig zagging with right hand   MD Evaluation Airway: WNL Heart: WNL Abdomen: WNL Chest/ Lungs: WNL ASA  Classification: 3 Mallampati/Airway Score: Two   Imaging: DG Chest Port 1 View  Result Date: 04/26/2022 CLINICAL DATA:  CVA. EXAM: PORTABLE CHEST 1 VIEW COMPARISON:  Portable yesterday 2:50 p.m. FINDINGS: The heart size and mediastinal contours are within normal limits. There is aortic atherosclerosis. Both lungs are clear. The visualized skeletal structures are intact. The is advanced degenerative change of the shoulders with chronic rotator cuff arthropathy. Right axillary surgical clips. IMPRESSION: No active disease. Aortic atherosclerosis. Electronically Signed   By: Telford Nab M.D.   On: 04/26/2022 06:36   CT HEAD WO CONTRAST (5MM)  Result Date: 04/26/2022 CLINICAL DATA:  89 year old female presenting with headache, vomiting, hypertension. 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 EXAM: CT HEAD WITHOUT CONTRAST TECHNIQUE: Contiguous axial images were obtained from the base of the skull through the vertex without intravenous contrast. RADIATION DOSE REDUCTION: This exam was performed according to the departmental dose-optimization program which includes automated exposure control, adjustment of the mA and/or kV according to patient size and/or use of iterative reconstruction technique. COMPARISON:  Brain MRI, head CT, CTA head and neck yesterday. FINDINGS:  Brain: Cytotoxic edema in the left cerebellum PICA territory is now evident by CT corresponding to the MRI appearance. No hemorrhagic transformation. Basilar cisterns remain patent, no posterior fossa mass effect at this time. Supratentorial gray-white differentiation remains stable, no convincing cytotoxic edema elsewhere. Chronic small and medium-sized vessel ischemic changes in both hemispheres. No midline shift, ventriculomegaly, mass effect, evidence of mass lesion, intracranial hemorrhage. Vascular: Calcified atherosclerosis at the skull base. Skull: No acute osseous abnormality identified. Sinuses/Orbits: Stable mild paranasal sinus opacity, stables mild paranasal sinus bubbly opacity and mucosal thickening. Tympanic cavities and mastoids remain clear. Other: No acute orbit or scalp soft tissue finding. IMPRESSION: 1. Expected CT appearance of Left cerebellum PICA infarct. No hemorrhagic transformation or posterior  fossa mass effect at this time. 2. No other acute intracranial abnormality by CT. 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 Electronically Signed   By: Genevie Ann M.D.   On: 04/26/2022 05:37   DG Chest Port 1 View  Result Date: 04/25/2022 CLINICAL DATA:  Systemic inflammatory response syndrome. EXAM: PORTABLE CHEST 1 VIEW COMPARISON:  Chest radiographs 01/03/2022 FINDINGS: Cardiac silhouette and mediastinal contours are within normal limits. The lungs are clear. No pleural effusion or pneumothorax. Right axillary surgical clips are again seen.) Right-greater-than-left axillary calcific foci as seen on prior CT and unchanged from 01/03/2022 radiographs, likely calcified lymph nodes as can be seen following prior remote granulomatous infection. IMPRESSION: No active disease. Electronically Signed   By: Yvonne Kendall M.D.   On: 04/25/2022 15:09   MR BRAIN WO CONTRAST  Result Date: 04/25/2022 CLINICAL DATA:  Stroke suspected EXAM: MRI HEAD WITHOUT CONTRAST TECHNIQUE:  Multiplanar, multiecho pulse sequences of the brain and surrounding structures were obtained without intravenous contrast. COMPARISON:  Same day CT Head and CTA head/neck FINDINGS: Brain: Acute infarcts in the medial aspect of the left cerebellar hemisphere. There is an additional subtle region of increased signal on diffusion-weighted imaging in the medial aspect of the right occipital lobe (series 5, image 27), favored to represent an additional site of an acute to subacute infarct. Sequela of moderate chronic microvascular ischemic change. No hemorrhage. No hydrocephalus. Vascular: See same day CTA head and neck angiogram for vascular findings. Redemonstrated absent flow void in the V4 segment of the left vertebral artery. Skull and upper cervical spine: Redemonstrated is a chronic appearing type 2 dens fracture. Sinuses/Orbits: Bilateral lens replacement. Trace right mastoid effusion. Trace mucosal thickening in the posterior ethmoid air cells on the right. Other: None. IMPRESSION: 1. 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 2. Additional region of increased signal on diffusion-weighted imaging in the medial aspect of the right occipital lobe, favored to represent an additional site of an acute to subacute infarct. 3. Absent flow void in the V4 segment of the left vertebral artery, in keeping with known occlusion seen on same day CTA head/neck Electronically Signed   By: Marin Roberts M.D.   On: 04/25/2022 13:59   CT Angio Chest Aorta W and/or Wo Contrast  Result Date: 04/25/2022 CLINICAL DATA:  89 year old female with new onset headache, hypertension, vomiting. EXAM: CT ANGIOGRAPHY CHEST WITH CONTRAST TECHNIQUE: Multidetector CT imaging of the chest was performed using the standard protocol during bolus administration of intravenous contrast. Multiplanar CT image reconstructions and MIPs were obtained to evaluate the vascular anatomy. RADIATION DOSE REDUCTION: This exam was  performed according to the departmental dose-optimization program which includes automated exposure control, adjustment of the mA and/or kV according to patient size and/or use of iterative reconstruction technique. CONTRAST:  157m OMNIPAQUE IOHEXOL 350 MG/ML SOLN COMPARISON:  CTA head and neck today reported separately. Portable chest 01/03/2022. CT Abdomen and Pelvis 05/17/2021. FINDINGS: Cardiovascular: Adequate contrast bolus timing in the pulmonary arterial tree. No pulmonary artery filling defect. Calcified coronary artery atherosclerosis. Comparatively mild Calcified aortic atherosclerosis. Cardiac size at the upper limits of normal. No pericardial effusion. No thoracic aortic dissection or aneurysm. Mediastinum/Nodes: No mediastinal mass or lymphadenopathy. Lungs/Pleura: Major airways are patent. There is asymmetric anterior right lung subpleural scarring, such as can be seen with chest radiation. Mild bilateral lung base atelectasis. No pleural effusion. No suspicious pulmonary nodule or convincing pulmonary inflammation. Upper Abdomen: Negative visible liver, spleen, pancreas, adrenal glands,  left kidney, and bowel in the upper abdomen. Musculoskeletal: Mild chronic appearing T1 superior endplate compression. Very severe degenerative osseous changes at both shoulders. No acute or suspicious osseous lesion identified. Other findings: Surgical clips in the right breast, and inferomedial to that there is an oval mixed density fairly circumscribed 19 mm nodule or mass on series 6, image 361. Review of the MIP images confirms the above findings. IMPRESSION: 1. No evidence of pulmonary embolus or other acute finding in the Chest. 2. A 19 mm mixed density nodule or mass in the right breast is inferio-medial to surgical clips. Recommend follow-up Outpatient Mammography correlation. 3. Calcified coronary artery, mild Aortic Atherosclerosis (ICD10-I70.0). Electronically Signed   By: Genevie Ann M.D.   On: 04/25/2022  11:51   CT Angio Head Neck W WO CM  Result Date: 04/25/2022 CLINICAL DATA:  89 year old female with new onset headache, hypertension, vomiting. EXAM: CT ANGIOGRAPHY HEAD AND NECK TECHNIQUE: Multidetector CT imaging of the head and neck was performed using the standard protocol during bolus administration of intravenous contrast. Multiplanar CT image reconstructions and MIPs were obtained to evaluate the vascular anatomy. Carotid stenosis measurements (when applicable) are obtained utilizing NASCET criteria, using the distal internal carotid diameter as the denominator. RADIATION DOSE REDUCTION: This exam was performed according to the departmental dose-optimization program which includes automated exposure control, adjustment of the mA and/or kV according to patient size and/or use of iterative reconstruction technique. CONTRAST:  146m OMNIPAQUE IOHEXOL 350 MG/ML SOLN COMPARISON:  Head CT 0919 hours today. CTA chest today reported separately. Prior CT face 01/03/2022. previous brain MRI 01/04/2022. 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 Other advanced cervical spine degeneration, including facet arthropathy and developing facet ankylosis. No acute osseous abnormality identified. Upper chest: Negative lung apices and visible superior mediastinum. Other neck: No acute finding. Aortic arch: 3 vessel arch not entirely included. No significant arch atherosclerosis. Right carotid system: Visible brachiocephalic artery and right CCA origin are normal. Mild calcified plaque at the right carotid bifurcation. Mildly tortuous right ICA with no significant stenosis. Left carotid system: Normal left CCA origin. Tortuous cervical left carotid with minimal atherosclerosis and no stenosis. Vertebral arteries: Soft and calcified proximal right subclavian artery plaque with less than 50% stenosis. Normal right vertebral artery origin.  Dominant right vertebral artery is patent to the skull base with no plaque or stenosis. Normal left subclavian artery origin. Calcified proximal left subclavian artery plaque without significant stenosis. Left vertebral artery origin is normal. But the left vertebral artery enhancement fades completely in the V1 segment, with no V2 segment enhancement. No V3 segment enhancement. See intracranial findings below. CTA HEAD Posterior circulation: Nonenhancing proximal left vertebral V3 segment. The vertebrobasilar junction is enhancing, with faint retrograde contrast on the left. Patent right V4 segment with mild calcified plaque and no significant stenosis. Patent right AICA. Both distal vertebral artery flow voids were maintained on the brain MRI last year. Patent basilar artery without stenosis. SCA and PCA origins are patent. Basilar tip appears to be patent. Posterior communicating arteries are diminutive or absent. But there is up to moderate bilateral P1 segment irregularity and stenosis greater on the left (series 8, image 22. And there is downstream bilateral P3 severe stenosis and branch 10 UA Shin (on the left series 10, image 31). Anterior circulation: Both ICA siphons are patent. There is minimal left siphon calcified plaque and no stenosis. No significant right siphon  plaque or stenosis. Patent carotid termini, MCA and ACA origins. Anterior communicating artery and proximal ECA branches are patent. Mild to moderate bilateral A2 and up to severe bilateral 83 segment irregularity and stenosis (series 10, image 28). Left MCA M1 segment is patent with mild irregularity. Patent left MCA bifurcation without high-grade stenosis. However, severe posterior left M2 stenosis on series 10, image 37 with distal enhancement. Contralateral right MCA M1 is also patent and mildly irregular. Right MCA bifurcation is patent with moderate stenosis more affecting the anterior division, which remains irregular. And there is  widespread right M2, M3 moderate and severe stenoses (series 10, image 21). No discrete right MCA branch occlusion identified. Venous sinuses: Early contrast timing, not evaluated. Anatomic variants: None significant. Review of the MIP images confirms the above findings IMPRESSION: 1. 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 This could be secondary to vertebral artery dissection or atherosclerosis occlusion (see #2). 2. Little atherosclerosis in the neck, but evidence of Advanced Intracranial Atherosclerosis with widespread Moderate And Severe 2nd and 3rd COW branch stenoses. No discrete branch occlusion. Study discussed by telephone with Dr. Nathaniel Man on 04/25/2022 at 11:38. She advises an NIH stroke scale of zero, but unexplained vomiting. We discussed Neurology consultation and that follow-up noncontrast Brain MRI to evaluate for occult brain ischemia might be valuable for treatment planning. Electronically Signed   By: Genevie Ann M.D.   On: 04/25/2022 11:45   CT Head Wo Contrast  Result Date: 04/25/2022 CLINICAL DATA:  Headache, new onset, high blood pressure, vomiting. EXAM: CT HEAD WITHOUT CONTRAST TECHNIQUE: Contiguous axial images were obtained from the base of the skull through the vertex without intravenous contrast. RADIATION DOSE REDUCTION: This exam was performed according to the departmental dose-optimization program which includes automated exposure control, adjustment of the mA and/or kV according to patient size and/or use of iterative reconstruction technique. COMPARISON:  Head CT dated 01/03/2022 FINDINGS: Brain: Ventricles are stable in size and configuration. Chronic small vessel ischemic changes are seen within the bilateral periventricular and subcortical white matter regions. No mass, hemorrhage, edema or other evidence of acute parenchymal abnormality. No extra-axial hemorrhage. Vascular:  Chronic calcified atherosclerotic changes of the large vessels at the skull base. No unexpected hyperdense vessel. Skull: Normal. Negative for fracture or focal lesion. Sinuses/Orbits: No acute finding. Other: None. IMPRESSION: 1. No acute findings. No intracranial mass, hemorrhage or edema. 2. Chronic small vessel ischemic changes in the white matter. Electronically Signed   By: Franki Cabot M.D.   On: 04/25/2022 09:26    Labs:  CBC: Recent Labs    01/06/22 0346 01/08/22 0327 04/25/22 0948 04/26/22 0320  WBC 8.4 6.5 17.8* 17.5*  HGB 12.5 11.7* 14.6 14.0  HCT 38.1 35.8* 43.9 40.8  PLT 282 269 418* 373    COAGS: No results for input(s): "INR", "APTT" in the last 8760 hours.  BMP: Recent Labs    01/08/22 0327 01/08/22 1513 01/09/22 0303 04/25/22 0948 04/26/22 0320  NA 138  --  136 135 133*  K 2.3* 4.5 4.1 3.4* 3.5  CL 103  --  103 100 97*  CO2 23  --  21* 23 22  GLUCOSE 155*  --  105* 141* 131*  BUN 17  --  16 30* 19  CALCIUM 9.2  --  9.6 9.4 8.9  CREATININE 0.69  --  0.62 0.65 0.64  GFRNONAA >60  --  >60 >60 >  60    LIVER FUNCTION TESTS: Recent Labs    01/03/22 1352 01/04/22 0440 01/08/22 0327 04/25/22 0948  BILITOT 1.1 1.1 0.4 0.9  AST '20 18 15 26  '$ ALT '14 11 14 23  '$ ALKPHOS 60 52 52 43  PROT 6.2* 5.7* 5.2* 7.0  ALBUMIN 3.8 3.4* 2.7* 4.1    TUMOR MARKERS: No results for input(s): "AFPTM", "CEA", "CA199", "CHROMGRNA" in the last 8760 hours.  Assessment and Plan:  89 y/o F who presented to [facility] 2/25 with sudden onset headache and dizziness. She was found to have a left vertebral artery occlusion on CTA and has been transferred to [facility] for further workup including diagnostic cerebral angiogram with NIR.  Plan for procedure today. Patient slightly confused on exam - seems most likely due to fatigue/headache, however she seems to understand the indications for the procedure and is agreeable to proceed. I have attempted to call her husband x 2 to discuss the  procedure and have left a voicemail requesting a return call.  Risks and benefits of diagnostic cerebral  angiogram were discussed with the patient and her husband Sherry [name] including, but not limited to bleeding, infection, vascular injury, stroke, or contrast induced renal failure.  This interventional procedure involves the use of X-rays and because of the nature of the planned procedure, it is possible that we will have prolonged use of X-ray fluoroscopy. Potential radiation risks to you include (but are not limited to) the following: - A slightly elevated risk for cancer  several years later in life. This risk is typically less than 0.5% percent. This risk is low in comparison to the normal incidence of human cancer, which is 33% for women and 50% for men according to the [employer] [place]. - Radiation induced injury can include skin redness, resembling a rash, tissue breakdown / ulcers and hair loss (which can be temporary or permanent).  The likelihood of either of these occurring depends on the difficulty of the procedure and whether you are sensitive to radiation due to previous procedures, disease, or genetic conditions.  IF your procedure requires a prolonged use of radiation, you will be notified and given written instructions for further action.  It is your responsibility to monitor the irradiated area for the 2 weeks following the procedure and to notify your physician if you are concerned that you have suffered a radiation induced injury.    All of the patient's questions were answered, patient is agreeable to proceed.  Consent signed and in chart.  Thank you for this interesting consult.  I greatly enjoyed meeting Sherry [name] and look forward to participating in their care.  A copy of this report was sent to the requesting provider on this date.  Electronically Signed: Joaquim Nam, PA-C 04/26/2022, 9:09 AM   I spent a total of 40 Minutes in face to face in  clinical consultation, greater than 50% of which was counseling/coordinating care for left vertebral artery occlusion.

## 2022-04-26 NOTE — Progress Notes (Signed)
[facility] ADULT ICU REPLACEMENT PROTOCOL   The patient does apply for the [facility] Adult ICU Electrolyte Replacment Protocol based on the criteria listed below:   1.Exclusion criteria: TCTS, ECMO, Dialysis, and Myasthenia Gravis patients 2. Is GFR >/= 30 ml/min? Yes.    Patient's GFR today is >60 3. Is SCr </= 2? Yes.   Patient's SCr is 0.64 mg/dL 4. Did SCr increase >/= 0.5 in 24 hours? No. 5.Pt's weight >40kg  Yes.   6. Abnormal electrolyte(s): Mag 1.4  7. Electrolytes replaced per protocol 8.  Call MD STAT for K+ </= 2.5, Phos </= 1, or Mag </= 1 Physician:  Dr Otilio Jefferson, [place] Grumbling 04/26/2022 5:52 AM

## 2022-04-26 NOTE — Plan of Care (Signed)
Pt a and O x 4.  MAE x 4.  NIH is now 2 with ataxia noted LUE and LLE.  NPO for angiogram.  BP within goal range on cleveprex.

## 2022-04-26 NOTE — Progress Notes (Signed)
  Echocardiogram 2D Echocardiogram has been performed.  Sherry [name] 04/26/2022, 10:26 AM

## 2022-04-26 NOTE — Progress Notes (Signed)
Transferred pt to room 3w24 at this time.  Pt has no s/s of any acute distress.

## 2022-04-26 NOTE — Progress Notes (Signed)
Inpatient Rehab Admissions Coordinator:   Per therapy recommendations, patient was screened for CIR candidacy by Clemens Catholic, MS, CCC-SLP . At this time, Pt. is not yet at a level to tolerate intensity of CIR; however,   Pt. may have potential to progress to becoming a potential CIR candidate, so CIR admissions team will follow and monitor for progress and participation with therapies and place consult order if Pt. appears to be an appropriate candidate. Please contact me with any questions.   Clemens Catholic, [place], [place] Admissions Coordinator  [phone] ([place]) [phone] (office)

## 2022-04-26 NOTE — Progress Notes (Addendum)
NAME:  Sherry [name], MRN:  [id number], DOB:  1931-01-24, LOS: 1 ADMISSION DATE:  04/25/2022, CONSULTATION DATE:  2/25 REFERRING MD:  Dr. Rory Percy , CHIEF COMPLAINT:  N/V, headache   History of Present Illness:  89 y/o F who presented to [facility] on 2/25 with reports of nausea, vomiting and headache.   The patient reports she has had several falls in the recent months. One in December were she suffered a concussion. She reportedly fell on Thursday while using her walker but is unsure what happended.  She had been doing well until the am of presentation when she woke with a headache, nausea and vomiting. She had dizziness.  She was last known well at 10pm the evening prior to admit. She was significantly hypertensive on presentation - [id number] systolic.  Emergent CT of the head was completed which was negative for acute intracranial findings. Subsequent CTA head showed a left vertebral artery occlusion. CTA chest aorta was negative for PE, showed a 58m mixed density nodule/mass in the right breast inferio-medial to surgical clips. Initial labs notable for K+ 3.4, glucose 141, BUN 30, cr 0.65, lactic acid 2.5, WBC 17.8, Hgb 14.6, and plaetelets 418.  Neurology was consulted who recommended MRI and SBP goal of 180.  MRI brain showed an acute infarct in the medial aspect of the left cerebellar hemisphere, increased signal on DWI in the medial aspect of the right occipital lobe, absent flow void in the V4 segment of the left vertebral artery.   [facility]   She reports her PCP took her off her home meds in December.  She does have a visit from 12/27 which shows an "end date" under the med profile and discontinued all HTN agents.  However, there is no mention of stopping her meds in the plan. She is a full code and does have a living will.    Pertinent  Medical History  HTN  C1 Fracture  SDH - 2019 Falls - concussion in December 2023  Breast Cancer s/p Lumpectomy, Radiation   Fibrocystic Breast Disease Motion Sickness  Osteoarthritis  PONV Squamous Cell Carcinoma   Significant Hospital Events: Including procedures, antibiotic start and stop dates in addition to other pertinent events   2/25 Admit with N/V, Headache.   2/26 CT H without hemorrhagic conversion, expected L PICA infarct. adding PO antihypertensives, wean clevi   Interim History / Subjective:   Hurts all over today  This morning went for CT H -  no hemorrhagic conversion   Objective   Blood pressure (!) 187/74, pulse 84, temperature (!) 97.3 F (36.3 C), temperature source Oral, resp. rate 19, weight 56.2 kg, SpO2 95 %.        Intake/Output Summary (Last 24 hours) at 04/26/2022 0929 Last data filed at 04/26/2022 [id number] per 24 hour  Intake 370.71 ml  Output 400 ml  Net -29.29 ml   Filed Weights   04/26/22 0731  Weight: 56.2 kg    Examination: General: frail elderly F NAD  HENT: NCAT pink mm  Lungs: CTAb  Cardiovascular: rrr Abdomen:  thin soft ndnt  Extremities: no acute joint deformity  Neuro: slight ataxia. 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 Afebrile.  -could be that this is reactive instead of infectious  P -will check a PCT, bcx    Elevated lactic acid -r/o infection as above   N/v -PRN zofran, compazine   R Breast Nodule / Mass Hx R Breast Cancer s/p Lumpectomy  -will need follow up with [facility] as outpatient after  discharge for further evaluation    Best Practice (right click and "Reselect all SmartList Selections" daily)  Diet/type: NPO DVT prophylaxis: SCD GI prophylaxis: N/A Lines: N/A Foley:  N/A Code Status:  full code Last date of multidisciplinary goals of care discussion: patient reports she would want her husband or son to make decisions for her if she could not.    Labs   CBC: Recent Labs  Lab 04/25/22 0948 04/26/22 0320  WBC 17.8* 17.5*  NEUTROABS 7.5  --   HGB 14.6 14.0  HCT 43.9 40.8  MCV 88.3 87.6  PLT 418* [id number]    Basic Metabolic Panel: Recent Labs  Lab 04/25/22 0948 04/26/22 0320  NA 135 133*  K 3.4* 3.5  CL 100 97*  CO2 23 22  GLUCOSE 141* 131*  BUN 30* 19  CREATININE 0.65 0.64  CALCIUM 9.4 8.9  MG  --  1.4*  PHOS  --  3.0   GFR: Estimated Creatinine Clearance: 37.7 mL/min (by C-G formula based on SCr of 0.64 mg/dL). Recent Labs  Lab 04/25/22 0948 04/25/22 1148 04/26/22 0320  WBC 17.8*  --  17.5*  LATICACIDVEN 2.5* 2.5* 2.0*    Liver Function Tests: Recent Labs  Lab 04/25/22 0948  AST 26  ALT 23  ALKPHOS 43  BILITOT 0.9  PROT 7.0  ALBUMIN 4.1   Recent Labs  Lab 04/25/22 0948  LIPASE 43   No results for input(s): "AMMONIA" in the last 168 hours.  ABG No results found for: "PHART", "PCO2ART", "PO2ART", "HCO3", "TCO2", "ACIDBASEDEF", "O2SAT"   Coagulation Profile: No results for input(s): "INR", "PROTIME" in the last 168 hours.  Cardiac Enzymes: No results for input(s): "CKTOTAL", "CKMB", "CKMBINDEX", "TROPONINI" in the last 168 hours.  HbA1C: No results found for: "HGBA1C"  CBG: No results for input(s): "GLUCAP" in the last 168 hours.  CRITICAL CARE Performed by: Cristal Generous   Total critical care time: 37 minutes  Critical care time was exclusive of separately billable procedures and treating other patients.  Critical care was necessary to treat or prevent imminent or life-threatening deterioration.  Critical care  was time spent personally by me on the following activities: development of treatment plan with patient and/or surrogate as well as nursing, discussions with consultants, evaluation of patient's response to treatment, examination of patient, obtaining history from patient or surrogate, ordering and performing treatments and interventions, ordering and review of laboratory studies, ordering and review of radiographic studies, pulse oximetry and re-evaluation of patient's condition.  Eliseo Gum MSN, AGACNP-BC [place] for pager 04/26/2022, 9:29 AM

## 2022-04-26 NOTE — Progress Notes (Signed)
Attempted to call report to 3w at this time.  Nurse to call me back per secretary

## 2022-04-26 NOTE — Progress Notes (Addendum)
STROKE TEAM PROGRESS NOTE   INTERVAL HISTORY No family is at the bedside.   Patient continues to c/o dizziness with any eye movements. Bedrest orders in place for today.  OK from neuro standpoint to transfer out of ICU.   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  She continues to have dizziness with head movement today but follow-up CT scan this morning shows stable appearance of the cerebral infarct without significant cytotoxic edema mass effect on fourth ventricle or hydrocephalus. Vitals:   04/26/22 1415 04/26/22 1430 04/26/22 1445 04/26/22 1500  BP: (!) 183/68 (!) 178/75 (!) 188/76 (!) 194/83  Pulse: 67 79 75 79  Resp: 17 (!) '22 16 13  '$ Temp:      TempSrc:      SpO2: 97% 94% 92% 97%  Weight:       CBC:  Recent Labs  Lab 04/25/22 0948 04/26/22 0320  WBC 17.8* 17.5*  NEUTROABS 7.5  --   HGB 14.6 14.0  HCT 43.9 40.8  MCV 88.3 87.6  PLT 418* [id number]   Basic Metabolic Panel:  Recent Labs  Lab 04/25/22 0948 04/26/22 0320  NA 135 133*  K 3.4* 3.5  CL 100 97*  CO2 23 22  GLUCOSE 141* 131*  BUN 30* 19  CREATININE 0.65 0.64  CALCIUM 9.4 8.9  MG  --  1.4*  PHOS  --  3.0   Lipid Panel:  Recent Labs  Lab 04/26/22 0818  CHOL 233*  TRIG 292*  HDL 53  CHOLHDL 4.4  VLDL 58*  LDLCALC 122*    IMAGING past 24 hours ECHOCARDIOGRAM COMPLETE  Result Date: 04/26/2022    ECHOCARDIOGRAM REPORT   Patient Name:   Sherry [name] Date of Exam: 04/26/2022 Medical Rec #:  [id number]         Height:       62.0 in Accession #:    [id number]        Weight:       123.9 lb Date of Birth:  1930/05/29        BSA:          1.560 m Patient Age:    89 years          BP:           169/63 mmHg Patient Gender: F                 HR:           85 bpm. Exam Location:  Inpatient Procedure: 2D Echo, 3D Echo, Cardiac Doppler and Color Doppler Indications:    Stroke  History:        Patient has no prior history of  Echocardiogram examinations.                 Abnormal ECG, Signs/Symptoms:Chest Pain and Dyspnea; Risk                 Factors:Hypertension and Dyslipidemia.  Sonographer:    Roseanna Rainbow RDCS Referring Phys: [id number] [place]  1. There is a mid cavity gradient of 51mHg related to hyperdynamic LVF and mid cavity obliteration during systole. Left ventricular ejection fraction, by estimation, is 70 to 75%. Left ventricular ejection fraction by 3D volume is 72 %. The left ventricle has hyperdynamic function. The left ventricle has no regional wall motion abnormalities. There is severe left ventricular hypertrophy of the basal segment. Left ventricular diastolic parameters  are consistent with Grade I diastolic dysfunction (impaired relaxation).  2. Right ventricular systolic function is normal. The right ventricular size is normal. There is mildly elevated pulmonary artery systolic pressure. The estimated right ventricular systolic pressure is Q000111Q mmHg.  3. The mitral valve is normal in structure. Mild mitral valve regurgitation. No evidence of mitral stenosis.  4. The aortic valve is tricuspid. Aortic valve regurgitation is trivial. Aortic valve sclerosis/calcification is present, without any evidence of aortic stenosis. Aortic valve area, by VTI measures 3.65 cm. Aortic valve mean gradient measures 7.0 mmHg.  Aortic valve Vmax measures 1.79 m/s.  5. The inferior vena cava is normal in size with greater than 50% respiratory variability, suggesting right atrial pressure of 3 mmHg. Conclusion(s)/Recommendation(s): No intracardiac source of embolism detected on this transthoracic study. Consider a transesophageal echocardiogram to exclude cardiac source of embolism if clinically indicated. FINDINGS  Left Ventricle: There is a mid cavity gradient of 4mHg related to hyperdynamic LVF and mid cavity obliteration during systole. Left ventricular ejection fraction, by estimation, is 70 to 75%. Left  ventricular ejection fraction by 3D volume is 72 %. The left ventricle has hyperdynamic function. The left ventricle has no regional wall motion abnormalities. The left ventricular internal cavity size was normal in size. There is severe left ventricular hypertrophy of the basal segment. Left ventricular diastolic parameters are consistent with Grade I diastolic dysfunction (impaired relaxation). Normal left ventricular filling pressure. Right Ventricle: The right ventricular size is normal. No increase in right ventricular wall thickness. Right ventricular systolic function is normal. There is mildly elevated pulmonary artery systolic pressure. The tricuspid regurgitant velocity is 2.92  m/s, and with an assumed right atrial pressure of 3 mmHg, the estimated right ventricular systolic pressure is [id number]. Left Atrium: Left atrial size was normal in size. Right Atrium: Right atrial size was normal in size. Pericardium: There is no evidence of pericardial effusion. Mitral Valve: The mitral valve is normal in structure. Mild mitral valve regurgitation. No evidence of mitral valve stenosis. MV peak gradient, 7.6 mmHg. The mean mitral valve gradient is 3.0 mmHg. Tricuspid Valve: The tricuspid valve is normal in structure. Tricuspid valve regurgitation is mild . No evidence of tricuspid stenosis. Aortic Valve: The aortic valve is tricuspid. Aortic valve regurgitation is trivial. Aortic valve sclerosis/calcification is present, without any evidence of aortic stenosis. Aortic valve mean gradient measures 7.0 mmHg. Aortic valve peak gradient measures 12.8 mmHg. Aortic valve area, by VTI measures 3.65 cm. Pulmonic Valve: The pulmonic valve was normal in structure. Pulmonic valve regurgitation is not visualized. No evidence of pulmonic stenosis. Aorta: The aortic root is normal in size and structure. Venous: The inferior vena cava is normal in size with greater than 50% respiratory variability, suggesting right atrial  pressure of 3 mmHg. IAS/Shunts: The interatrial septum appears to be lipomatous. No atrial level shunt detected by color flow Doppler.  LEFT VENTRICLE PLAX 2D LVIDd:         2.90 cm         Diastology LVIDs:         1.70 cm         LV e' medial:    5.11 cm/s LV PW:         1.10 cm         LV E/e' medial:  17.2 LV IVS:        1.60 cm         LV e' lateral:   9.36 cm/s LVOT diam:  2.30 cm         LV E/e' lateral: 9.4 LV SV:         106 LV SV Index:   68 LVOT Area:     4.15 cm        3D Volume EF                                LV 3D EF:    Left                                             ventricul                                             ar                                             ejection                                             fraction                                             by 3D                                             volume is                                             72 %.                                 3D Volume EF:                                3D EF:        72 %                                LV EDV:       87 ml                                LV ESV:       24 ml                                LV SV:  62 ml RIGHT VENTRICLE             IVC RV S prime:     11.50 cm/s  IVC diam: 1.50 cm TAPSE (M-mode): 1.5 cm LEFT ATRIUM           Index        RIGHT ATRIUM          Index LA diam:      2.60 cm 1.67 cm/m   RA Area:     7.08 cm LA Vol (A2C): 41.7 ml 26.74 ml/m  RA Volume:   11.30 ml 7.25 ml/m LA Vol (A4C): 13.4 ml 8.59 ml/m  AORTIC VALVE AV Area (Vmax):    3.41 cm AV Area (Vmean):   3.39 cm AV Area (VTI):     3.65 cm AV Vmax:           179.00 cm/s AV Vmean:          117.000 cm/s AV VTI:            0.289 m AV Peak Grad:      12.8 mmHg AV Mean Grad:      7.0 mmHg LVOT Vmax:         147.00 cm/s LVOT Vmean:        95.600 cm/s LVOT VTI:          0.254 m LVOT/AV VTI ratio: 0.88  AORTA Ao Root diam: 2.90 cm Ao Asc diam:  3.00 cm MITRAL VALVE                TRICUSPID VALVE MV Area  (PHT): 2.48 cm     TR Peak grad:   34.1 mmHg MV Area VTI:   3.05 cm     TR Vmax:        292.00 cm/s MV Peak grad:  7.6 mmHg MV Mean grad:  3.0 mmHg     SHUNTS MV Vmax:       1.38 m/s     Systemic VTI:  0.25 m MV Vmean:      82.1 cm/s    Systemic Diam: 2.30 cm MV Decel Time: 307 msec MV E velocity: 88.10 cm/s MV A velocity: 125.50 cm/s MV E/A ratio:  0.70 Fransico Him MD Electronically signed by Fransico Him MD Signature Date/Time: 04/26/2022/12:12:26 PM    Final    DG Chest Port 1 View  Result Date: 04/26/2022 CLINICAL DATA:  CVA. EXAM: PORTABLE CHEST 1 VIEW COMPARISON:  Portable yesterday 2:50 p.m. FINDINGS: The heart size and mediastinal contours are within normal limits. There is aortic atherosclerosis. Both lungs are clear. The visualized skeletal structures are intact. The is advanced degenerative change of the shoulders with chronic rotator cuff arthropathy. Right axillary surgical clips. IMPRESSION: No active disease. Aortic atherosclerosis. Electronically Signed   By: Telford Nab M.D.   On: 04/26/2022 06:36   CT HEAD WO CONTRAST (5MM)  Result Date: 04/26/2022 CLINICAL DATA:  89 year old female presenting with headache, vomiting, hypertension. 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 EXAM: CT HEAD WITHOUT CONTRAST TECHNIQUE: Contiguous axial images were obtained from the base of the skull through the vertex without intravenous contrast. RADIATION DOSE REDUCTION: This exam was performed according to the departmental dose-optimization program which includes automated exposure control, adjustment of the mA and/or kV according to patient size and/or use of iterative reconstruction technique. COMPARISON:  Brain MRI, head CT, CTA head and neck yesterday. FINDINGS: Brain: Cytotoxic edema in the left cerebellum PICA territory  is now evident by CT corresponding to the MRI appearance. No hemorrhagic transformation. Basilar cisterns remain patent, no posterior fossa mass effect at  this time. Supratentorial gray-white differentiation remains stable, no convincing cytotoxic edema elsewhere. Chronic small and medium-sized vessel ischemic changes in both hemispheres. No midline shift, ventriculomegaly, mass effect, evidence of mass lesion, intracranial hemorrhage. Vascular: Calcified atherosclerosis at the skull base. Skull: No acute osseous abnormality identified. Sinuses/Orbits: Stable mild paranasal sinus opacity, stables mild paranasal sinus bubbly opacity and mucosal thickening. Tympanic cavities and mastoids remain clear. Other: No acute orbit or scalp soft tissue finding. IMPRESSION: 1. Expected CT appearance of Left cerebellum PICA infarct. No hemorrhagic transformation or posterior fossa mass effect at this time. 2. No other acute intracranial abnormality by CT. 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 Electronically Signed   By: Genevie Ann M.D.   On: 04/26/2022 05:37    PHYSICAL EXAM  Temp:  [97.3 F (36.3 C)-98 F (36.7 C)] 97.8 F (36.6 C) (02/26 1200) Pulse Rate:  [67-104] 79 (02/26 1500) Resp:  [13-42] 13 (02/26 1500) BP: (128-194)/(55-168) 194/83 (02/26 1500) SpO2:  [91 %-100 %] 97 % (02/26 1500) Weight:  [56.2 kg] 56.2 kg (02/26 0731)  General - Well nourished, well developed, in no apparent distress. Cardiovascular - Sinus tachycardia on monitor.   Mental Status -  Level of arousal and orientation to time, place, and person were intact. Language including expression, naming, repetition, comprehension was assessed and found intact.  Cranial Nerves II - XII - II - Visual field intact OU. III, IV, VI - Extraocular movements intact. C/o dizziness with any EOM.  Few beats of end gaze nystagmus on left gaze V - Facial sensation intact bilaterally. VII - Facial movement intact bilaterally. VIII - Hearing & vestibular intact bilaterally. X - Palate elevates symmetrically. XI - Chin turning & shoulder shrug intact bilaterally. XII - Tongue  protrusion intact.  Motor - Generalized weakness in all extremities, pronator drift was absent.  Bulk and tone normal and fasciculations were absent.    Sensory - Light touch assessed and symmetrical.    Coordination -mild left finger-to-nose ataxia.  Left knee to heel coordination slow.  Normal coordination on the right Gait and Station - deferred.   ASSESSMENT/PLAN Sherry [name] is a 89 y.o. female with history of uncontrolled hypertension, breast cancer, syncope, chronic neck pain presenting with presented to the emergency room for sudden onset of headache and just a general sensation of not feeling well upon awakening . CT angiogram revealed occlusion of the left vertebral artery, V2 level onwards with reconstitution proximal to the vertebrobasilar junction. MRI brain revealed left cerebellar stroke with a possible small area of restricted diffusion in the right occipital lobe. IR procedure cancelled due to benefit vs risk stratification.   Acute Left posterior inferior cerebellar Infarct  Etiology:  occluded left vertebral artery secondary to atherosclerosis and uncontrolled hypertension Code Stroke CT head: No acute findings. No intracranial mass, hemorrhage or edema. Chronic small vessel ischemic changes in the white matter. CTA head & neck: Occluded Left Vertebral Artery in the V2 segment, appears new since November and with reconstitution only at the vertebrobasilar junction. This could be secondary to vertebral artery dissection or atherosclerosis occlusion (see #2). Little atherosclerosis in the neck, but evidence of Advanced Intracranial Atherosclerosis with widespread Moderate And Severe 2nd and 3rd COW branch stenoses. No discrete branch occlusion MRI    Acute infarct in the medial aspect of the left cerebellar hemisphere. Additional  region of increased signal on diffusion-weighted imaging in the medial aspect of the right occipital lobe, favored to represent an  additional site of an acute to subacute infarct. Absent flow void in the V4 segment of the left vertebral artery, in keeping with known occlusion seen on same day CTA head/neck MRI November 2023:  No acute intracranial infarct or other abnormality. Stable brain MRI with chronic microvascular ischemic disease and chronic right frontal encephalomalacia. 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 No hemorrhagic transformation or posterior fossa mass effect at this time. No other acute intracranial abnormality by CT. Chronic supratentorial small and medium-sized vessel ischemia  2D Echo: LVEF 70-75%, Severe LVH, Grade I diastolic dysfunction, Mildly elevated PASP, Mild MVR, Trivial AVR  LDL 122 HgbA1c: pending VTE prophylaxis - lovenox    There are no active orders of the following types: Diet, Nourishments.   No antithrombotic prior to admission, now on aspirin 81 mg daily and clopidogrel 75 mg daily. Recommend aspirin and plavix for 3 months, followed by aspirin alone.  Therapy recommendations:  AIR Disposition:  pending  Hypertension Home meds:  Toprol-XL '25mg'$ , Norvasc '10mg'$ , Hydralazine '50mg'$ , HCTZ '25mg'$ . Restarted.  UnStable Permissive hypertension (OK if < 220/120) but gradually normalize in 5-7 days Long-term BP goal normotensive  Hyperlipidemia Home meds:  none LDL 122, goal < 70 Add Lipitor '20mg'$  due to known vascular disease and LD not in goal  Continue statin at discharge   Other Stroke Risk Factors Advanced Age >/= 79   Other Active Problems Hx of syncope Previous Holter Monitor monitoring 12/2021 for syncope: NSR with episodes of tachycardia up to 120. Toprol-XL '25mg'$  daily added after this. Hx of chronic neck pain Hx of Breast Cancer  Hospital day # 1   Pt seen by Neuro NP/APP and later by MD. Note/plan to be edited by MD as needed.    Otelia Santee, DNP, AGACNP-BC [facility] Please use AMION for pager and EPIC for messaging  STROKE MD NOTE :  I have personally obtained history,examined this patient, reviewed notes, independently viewed imaging studies, participated in medical decision making and plan of care.ROS completed by me personally and pertinent positives fully documented  I have made any additions or clarifications directly to the above note. Agree with note above.  Patient presented with sudden onset of dizziness due to left vertebral artery occlusion with left posterior inferior cerebral artery branch infarct.  CT angiogram shows left vertebral artery occlusion in the P2 segment but there is some retrograde filling of the the terminal left vertebral artery.  Wean off Cleviprex drip and can tolerate mild permissive hypertension.  We will hold off on doing diagnostic cerebral catheter angiogram for now but will reconsider if she clinically gets worse.  Continue aspirin and Plavix for 3 months followed by aspirin alone and aggressive risk factor modification.  No family available at the bedside for discussion.  Discussed with Dr. Estanislado Pandy neurointerventional radiologist. This patient is critically ill and at significant risk of neurological worsening, death and care requires constant monitoring of vital signs, hemodynamics,respiratory and cardiac monitoring, extensive review of multiple databases, frequent neurological assessment, discussion with family, other specialists and medical decision making of high complexity.I have made any additions or clarifications directly to the above note.This critical care time does not reflect procedure time, or teaching time or supervisory time of PA/NP/Med Resident etc but could involve care discussion time.  I spent 30  minutes of neurocritical care time  in the care of  this patient.     Antony Contras, MD Medical Director [facility] Pager: [phone] 04/26/2022 4:46 PM  To contact Stroke  Continuity provider, please refer to [email or web address] After hours, contact General Neurology

## 2022-04-26 NOTE — Evaluation (Signed)
Occupational Therapy Evaluation Patient Details Name: Sherry [name] MRN: [id number] DOB: May 10, 1930 Today's Date: 04/26/2022   History of Present Illness Pt is an 89 yo female presenting 2/25 with nausea and headache, CT angiogram showed occlusion of the left vertebral artery and MRI showed left cerebellar stroke with a possible small area of restricted diffusion in the right occipital lobe. PMH includes: uncontrolled HTN, and recent fall with concussion.   Clinical Impression   PTA Patient independent and driving. Admitted for above and presents with problem list below.  Patient currently requires mod-max assist +2 for bed mobility, limited sitting tolerance due to dizziness and BP outside of parameters; ADLs with min to  total assist from bed level.  She is oriented and follows commands with increased time, some slow processing and decreased initiation/problem solving. Believe she will best benefit from further OT services acutely and after dc at AIR level to optimize independence, safety and return to PLOF.   BP supine 174/65 (96) BP post sitting EOB: 186/78 (108) *pt reports dizziness  BP supine:  193/70 (1040 *RN alerted     Recommendations for follow up therapy are one component of a multi-disciplinary discharge planning process, led by the attending physician.  Recommendations may be updated based on patient status, additional functional criteria and insurance authorization.   Follow Up Recommendations  Acute inpatient rehab (3hours/day)     Assistance Recommended at Discharge Frequent or constant Supervision/Assistance  Patient can return home with the following Two people to help with walking and/or transfers;A lot of help with bathing/dressing/bathroom;Assistance with cooking/housework;Direct supervision/assist for medications management;Direct supervision/assist for financial management;Assist for transportation;Help with stairs or ramp for entrance    Functional Status  Assessment  Patient has had a recent decline in their functional status and demonstrates the ability to make significant improvements in function in a reasonable and predictable amount of time.  Equipment Recommendations  Other (comment) (defer)    Recommendations for Other Services Rehab consult     Precautions / Restrictions Precautions Precautions: Fall;Other (comment) Precaution Comments: BP 140-180 Restrictions Weight Bearing Restrictions: No      Mobility Bed Mobility Overal bed mobility: Needs Assistance Bed Mobility: Rolling, Sidelying to Sit, Sit to Supine Rolling: Mod assist, +2 for physical assistance Sidelying to sit: Mod assist, +2 for physical assistance   Sit to supine: Max assist, +2 for physical assistance   General bed mobility comments: pt able to initiate with movement of LE, needed assist at trunk to roll and elevate to sitting. Pt dizzy upon sitting EOB and required increased assist to return to supine.    Transfers                   General transfer comment: deferred due to dizziness sitting EOB with BP elevation outside parameter      Balance Overall balance assessment: Needs assistance Sitting-balance support: Bilateral upper extremity supported, Feet supported Sitting balance-Leahy Scale: Poor Sitting balance - Comments: pt dependent on therapist to maintain sitting, progressive dizziness further limiting independence                                   ADL either performed or assessed with clinical judgement   ADL Overall ADL's : Needs assistance/impaired     Grooming: Minimal assistance;Bed level           Upper Body Dressing : Minimal assistance;Bed level   Lower Body Dressing: Total assistance;+2  for physical assistance;+2 for safety/equipment;Bed level     Toilet Transfer Details (indicate cue type and reason): deferred         Functional mobility during ADLs: Moderate assistance;Maximal assistance;+2 for  safety/equipment;+2 for physical assistance General ADL Comments: limited to EOB due to dizziness     Vision Baseline Vision/History: 0 No visual deficits Additional Comments: further assessment required- pt able to scan to L and R to locate # of fingers, reports vision looks normal and able to read clock     Perception     Praxis      Pertinent Vitals/Pain Pain Assessment Pain Assessment: Faces Faces Pain Scale: Hurts whole lot Pain Location: headache Pain Descriptors / Indicators: Discomfort, Grimacing Pain Intervention(s): Limited activity within patient's tolerance, Monitored during session, Repositioned, Other (comment) (RN notified)     Hand Dominance Right   Extremity/Trunk Assessment Upper Extremity Assessment Upper Extremity Assessment: RUE deficits/detail;LUE deficits/detail RUE Deficits / Details: grossly 4/5 MMT, able to bring hand to face to place washcloth on forehead. Further coordination testing recommended. LUE Deficits / Details: grossly 3+/5 MMT (mildly weaker than R UE), able to bring hand to face to place washcloth on forehead. Further coordination testing recommended.   Lower Extremity Assessment Lower Extremity Assessment: Defer to PT evaluation RLE Deficits / Details: pt denies sensory change, able to move briskly in response to cues and against gravitiy for partial ROM, not able to move against resistance. RLE Sensation: WNL (per patient) LLE Deficits / Details: pt denies sensory change, able to move briskly in response to cues and against gravitiy for partial ROM, not able to move against resistance. LLE Sensation: WNL (pt denies sensory change`)   Cervical / Trunk Assessment Cervical / Trunk Assessment: Kyphotic   Communication Communication Communication: No difficulties (soft spoken)   Cognition Arousal/Alertness: Awake/alert Behavior During Therapy: Flat affect Overall Cognitive Status: No family/caregiver present to determine baseline  cognitive functioning                                 General Comments: pt generally slow to respond and perseverating on thirst, able to follow cues and answer simple questions appropriately. easily distracted. no family present at time of evaluation     General Comments  L arm red with small red dots, BP cuff moved to R arm    Exercises     Shoulder Instructions      Home Living Family/patient expects to be discharged to:: Private residence Living Arrangements: Spouse/significant other Available Help at Discharge: Family;Available 24 hours/day Type of Home: House Home Access: Level entry     Home Layout: One level     Bathroom Shower/Tub: Occupational psychologist: Handicapped height Bathroom Accessibility: Yes   Home Equipment: Conservation officer, nature (2 wheels);Shower seat;Grab bars - tub/shower          Prior Functioning/Environment Prior Level of Function : Needs assist;History of Falls (last six months)             Mobility Comments: uses RW for mobility, recent fall ADLs Comments: independent with ADLs, cooking in kitchen. still driving        OT Problem List: Decreased strength;Impaired balance (sitting and/or standing);Decreased activity tolerance;Decreased safety awareness;Decreased knowledge of use of DME or AE;Decreased knowledge of precautions;Pain      OT Treatment/Interventions: Self-care/ADL training;DME and/or AE instruction;Therapeutic activities;Balance training;Patient/family education;Neuromuscular education;Energy conservation;Cognitive remediation/compensation    OT Goals(Current goals can  be found in the care plan section) Acute Rehab OT Goals Patient Stated Goal: feel better OT Goal Formulation: With patient Time For Goal Achievement: 05/10/22 Potential to Achieve Goals: Good  OT Frequency: Min 2X/week    Co-evaluation PT/OT/SLP Co-Evaluation/Treatment: Yes Reason for Co-Treatment: Complexity of the patient's  impairments (multi-system involvement);Necessary to address cognition/behavior during functional activity;For patient/therapist safety;To address functional/ADL transfers PT goals addressed during session: Mobility/safety with mobility;Balance;Strengthening/ROM OT goals addressed during session: ADL's and self-care;Strengthening/ROM      AM-PAC OT "6 Clicks" Daily Activity     Outcome Measure Help from another person eating meals?: Total Help from another person taking care of personal grooming?: A Little Help from another person toileting, which includes using toliet, bedpan, or urinal?: Total Help from another person bathing (including washing, rinsing, drying)?: A Lot Help from another person to put on and taking off regular upper body clothing?: A Little Help from another person to put on and taking off regular lower body clothing?: A Lot 6 Click Score: 12   End of Session Nurse Communication: Mobility status;Other (comment) (BP, HA)  Activity Tolerance: Treatment limited secondary to medical complications (Comment) (dizziness, elevated BP) Patient left: in bed;with call bell/phone within reach;with bed alarm set  OT Visit Diagnosis: Other abnormalities of gait and mobility (R26.89);Muscle weakness (generalized) (M62.81);Dizziness and giddiness (R42);Other symptoms and signs involving the nervous system (R29.898)                Time: [id number] OT Time Calculation (min): 22 min Charges:  OT General Charges $OT Visit: 1 Visit OT Evaluation $OT Eval Moderate Complexity: 1 Mod  Jolaine Artist, OT Acute Rehabilitation Services Office [phone]   Delight Stare 04/26/2022, 11:42 AM

## 2022-04-26 NOTE — Evaluation (Signed)
Speech Language Pathology Evaluation Patient Details Name: Sherry [name] MRN: [id number] DOB: May 31, 1930 Today's Date: 04/26/2022 Time: [id number] SLP Time Calculation (min) (ACUTE ONLY): 15 min  Problem List:  Patient Active Problem List   Diagnosis Date Noted   Hypertensive emergency 04/26/2022   Cerebellar stroke ([place]) 04/26/2022   Left-sided cerebrovascular accident (CVA) ([place]) 04/25/2022   CVA (cerebral vascular accident) ([place]) 04/25/2022   UTI (urinary tract infection) [id number]   Acute metabolic encephalopathy 0000000   Encephalopathy 01/04/2022   Hypertensive urgency 01/03/2022   Back pain 05/29/2021   Left hip pain 05/28/2021   Hypokalemia 05/28/2021   Orthostatic hypotension 05/25/2021   Left sided sciatica 05/24/2021   Duodenitis 05/18/2021   Anemia of chronic disease 05/16/2021   Abdominal pain 05/15/2021   Thrombocytosis 05/15/2021   Essential hypertension 04/30/2021   Spinal stenosis at L4-L5 level 04/27/2021   Intractable low back pain 04/27/2021   Low back pain 04/26/2021   Closed C1 fracture ([place]) 02/12/2020   Neck fracture ([place]) 02/11/2020   Chest pain with moderate risk for cardiac etiology 05/05/2017   Shortness of breath 05/05/2017   Hyperlipidemia 09/25/2016   Neuropathy 09/25/2016   Statin intolerance 09/25/2016   Anxiety 09/25/2016   Palpitations 09/25/2016   Past Medical History:  Past Medical History:  Diagnosis Date   Actinic keratosis    Breast cancer ([place])    C1 cervical fracture ([place])    Hypertension    Osteoarthritis    Past Surgical History:  Past Surgical History:  Procedure Laterality Date   BACK SURGERY     IR INJECT/THERA/INC NEEDLE/CATH/PLC EPI/LUMB/SAC W/IMG  04/29/2021   MOHS SURGERY     HPI:  Pt is an 89 yo female presenting to ED with reports of nausea, vomiting, and headache. MRI Brain showed an acute infarct in the medial aspect of the L cerebellar hemisphere. PMH includes HTN, SDH, multiple falls, and squamous  cell carcinoma.   Assessment / Plan / Recommendation Clinical Impression  Pt states she has not noticed any acute changes in cognition, which her sister confirms. Pt reports living with her husband and handling finances independently. She scored a 19/30 on the SLUMS with errors characterized by deficits in attention and simple problem-solving. During a divergent naming task, she named 8 animals in 30 seconds, but then refused to continue with the activity. Pt frequently stated that she did not want to finish the test, but participated given cues, athough question effort. Given her score, pt agrees that SLP will f/u to assess cognition while targeting attention, problem solving, and awareness as pt is more willing to participate in functional activities.    SLP Assessment  SLP Recommendation/Assessment: Patient needs continued Speech [place] [facility] SLP Visit Diagnosis: Cognitive communication deficit (R41.841)    Recommendations for follow up therapy are one component of a multi-disciplinary discharge planning process, led by the attending physician.  Recommendations may be updated based on patient status, additional functional criteria and insurance authorization.    Follow Up Recommendations  Acute inpatient rehab (3hours/day)    Assistance Recommended at Discharge  Frequent or constant Supervision/Assistance  Functional Status Assessment Patient has had a recent decline in their functional status and demonstrates the ability to make significant improvements in function in a reasonable and predictable amount of time.  Frequency and Duration min 2x/week  2 weeks      SLP Evaluation Cognition  Overall Cognitive Status: Impaired/Different from baseline Arousal/Alertness: Awake/alert Orientation Level: Oriented to person Attention: Sustained Sustained Attention: Impaired  Sustained Attention Impairment: Verbal complex Memory: Appears intact Awareness: Impaired Awareness  Impairment: Intellectual impairment Problem Solving: Impaired Problem Solving Impairment: Verbal basic Safety/Judgment: Impaired       Comprehension  Auditory Comprehension Overall Auditory Comprehension: Appears within functional limits for tasks assessed Visual Recognition/Discrimination Discrimination: Within Function Limits Reading Comprehension Reading Status: Not tested    Expression Expression Primary Mode of Expression: Verbal Verbal Expression Overall Verbal Expression: Appears within functional limits for tasks assessed Written Expression Dominant Hand: Right   Oral / Motor  Oral Motor/Sensory Function Overall Oral Motor/Sensory Function: Within functional limits Motor Speech Overall Motor Speech: Appears within functional limits for tasks assessed            Fabio Asa., Student SLP  04/26/2022, 5:05 PM

## 2022-04-26 NOTE — Progress Notes (Signed)
2D echo attempted, patient is with therapy. Will try later

## 2022-04-27 DIAGNOSIS — I63212 Cerebral infarction due to unspecified occlusion or stenosis of left vertebral arteries: Secondary | ICD-10-CM | POA: Diagnosis not present

## 2022-04-27 LAB — GLUCOSE, CAPILLARY: Glucose-Capillary: 141 mg/dL — ABNORMAL HIGH (ref 70–99)

## 2022-04-27 LAB — HEMOGLOBIN A1C
Hgb A1c MFr Bld: 5.6 % (ref 4.8–5.6)
Mean Plasma Glucose: 114 mg/dL

## 2022-04-27 MED ORDER — SODIUM CHLORIDE 0.9 % IV BOLUS
500.0000 mL | Freq: Once | INTRAVENOUS | Status: AC
  Administered 2022-04-27: 500 mL via INTRAVENOUS

## 2022-04-27 MED ORDER — ACETAMINOPHEN 325 MG PO TABS
650.0000 mg | ORAL_TABLET | Freq: Four times a day (QID) | ORAL | Status: DC | PRN
  Administered 2022-04-27 – 2022-05-06 (×5): 650 mg via ORAL
  Filled 2022-04-27 (×6): qty 2

## 2022-04-27 MED ORDER — BISACODYL 10 MG RE SUPP
10.0000 mg | Freq: Once | RECTAL | Status: AC
  Administered 2022-04-28: 10 mg via RECTAL
  Filled 2022-04-27: qty 1

## 2022-04-27 MED ORDER — ORAL CARE MOUTH RINSE: 15.0000 mL | OROMUCOSAL | Status: DC | PRN

## 2022-04-27 MED ORDER — MELATONIN 3 MG PO TABS
3.0000 mg | ORAL_TABLET | Freq: Once | ORAL | Status: AC
  Administered 2022-04-29: 3 mg via ORAL
  Filled 2022-04-27 (×2): qty 1

## 2022-04-27 MED ORDER — HYDRALAZINE HCL 20 MG/ML IJ SOLN
10.0000 mg | INTRAMUSCULAR | Status: DC | PRN
  Administered 2022-04-27: 10 mg via INTRAVENOUS
  Filled 2022-04-27 (×2): qty 1

## 2022-04-27 MED ORDER — KETOROLAC TROMETHAMINE 15 MG/ML IJ SOLN
15.0000 mg | Freq: Once | INTRAMUSCULAR | Status: AC
  Administered 2022-04-27: 15 mg via INTRAVENOUS
  Filled 2022-04-27: qty 1

## 2022-04-27 MED ORDER — ATORVASTATIN CALCIUM 40 MG PO TABS
40.0000 mg | ORAL_TABLET | Freq: Every day | ORAL | Status: DC
  Administered 2022-04-27 – 2022-05-12 (×16): 40 mg via ORAL
  Filled 2022-04-27 (×16): qty 1

## 2022-04-27 MED ORDER — SODIUM CHLORIDE 0.9 % IV SOLN: 12.5000 mg | Freq: Four times a day (QID) | INTRAVENOUS | Status: DC | PRN

## 2022-04-27 MED ORDER — METOCLOPRAMIDE HCL 5 MG/ML IJ SOLN
5.0000 mg | Freq: Two times a day (BID) | INTRAMUSCULAR | Status: DC
  Administered 2022-04-27 – 2022-05-04 (×15): 5 mg via INTRAVENOUS
  Filled 2022-04-27 (×16): qty 2

## 2022-04-27 MED ORDER — PROCHLORPERAZINE EDISYLATE 10 MG/2ML IJ SOLN
5.0000 mg | Freq: Four times a day (QID) | INTRAMUSCULAR | Status: AC | PRN
  Administered 2022-04-27 – 2022-04-28 (×2): 5 mg via INTRAVENOUS
  Filled 2022-04-27 (×2): qty 2

## 2022-04-27 NOTE — Plan of Care (Signed)
  Problem: Education: Goal: Knowledge of disease or condition will improve Outcome: Progressing   Problem: Self-Care: Goal: Verbalization of feelings and concerns over difficulty with self-care will improve Outcome: Progressing Goal: Ability to communicate needs accurately will improve Outcome: Progressing   Problem: Nutrition: Goal: Risk of aspiration will decrease Outcome: Progressing   Problem: Education: Goal: Knowledge of General Education information will improve Description: Including pain rating scale, medication(s)/side effects and non-pharmacologic comfort measures Outcome: Progressing   Problem: Clinical Measurements: Goal: Will remain free from infection Outcome: Progressing

## 2022-04-27 NOTE — Progress Notes (Signed)
  Transition of Care [facility]) Screening Note   Patient Details  Name: Sherry [name] Date of Birth: 12/19/1930   Transition of [facility]) CM/SW Contact:    Pollie Friar, RN Phone Number: 04/27/2022, 2:58 PM   Pt is from home with spouses. CIR assessing for a rehab admission. Transition of Care Department [facility]) has reviewed patient. We will continue to monitor patient advancement through interdisciplinary progression rounds. If new patient transition needs arise, please place a TOC consult.

## 2022-04-27 NOTE — Progress Notes (Addendum)
2330 patient alert x4 able to make needs known left sided arm weaker with drift noted on assessment

## 2022-04-27 NOTE — Progress Notes (Signed)
SBP 229. PRN hydralazine 10 mg IV given

## 2022-04-27 NOTE — Progress Notes (Signed)
PT Cancellation Note  Patient Details Name: Sherry [name] MRN: [id number] DOB: 07-04-1930   Cancelled Treatment:    Reason Eval/Treat Not Completed: Medical issues which prohibited therapy (dizziness). Pt deferring mobility at this time due to persistent dizziness. Will check back later today.   Leighton Roach, PT  Acute Rehab Services Secure chat preferred Office [place] 04/27/2022, 1:00 PM

## 2022-04-27 NOTE — Progress Notes (Signed)
PT Cancellation Note  Patient Details Name: Sherry [name] MRN: [id number] DOB: 29-Sep-1930   Cancelled Treatment:    Reason Eval/Treat Not Completed: Medical issues which prohibited therapy (pt still very dizzy). Will follow tomorrow.   Leighton Roach, PT  Acute Rehab Services Secure chat preferred Office [place] 04/27/2022, 2:11 PM

## 2022-04-27 NOTE — Progress Notes (Addendum)
STROKE TEAM PROGRESS NOTE   INTERVAL HISTORY No family is at the bedside.  No new imaging.  Transferred from ICU 2/26.  Patient continues to c/o dizziness with any eye movements, along with headache/nausea. PRN medications added. Encourage PT/OT and OOB for meals, fall precautions.  Discharge planning for AIR.      Vitals:   04/27/22 0300 04/27/22 0342 04/27/22 0825 04/27/22 0900  BP: (!) 180/69  (!) 229/80 (!) 187/68  Pulse: 60  60   Resp: 18  18   Temp: 98.4 F (36.9 C)  98.2 F (36.8 C) 98 F (36.7 C)  TempSrc: Oral  Oral Oral  SpO2: 100%  100%   Weight:  57.3 kg     CBC:  Recent Labs  Lab 04/25/22 0948 04/26/22 0320  WBC 17.8* 17.5*  NEUTROABS 7.5  --   HGB 14.6 14.0  HCT 43.9 40.8  MCV 88.3 87.6  PLT 418* [id number]    Basic Metabolic Panel:  Recent Labs  Lab 04/25/22 0948 04/26/22 0320  NA 135 133*  K 3.4* 3.5  CL 100 97*  CO2 23 22  GLUCOSE 141* 131*  BUN 30* 19  CREATININE 0.65 0.64  CALCIUM 9.4 8.9  MG  --  1.4*  PHOS  --  3.0    Lipid Panel:  Recent Labs  Lab 04/26/22 0818  CHOL 233*  TRIG 292*  HDL 53  CHOLHDL 4.4  VLDL 58*  LDLCALC 122*     IMAGING past 24 hours No results found.  PHYSICAL EXAM  Temp:  [97.4 F (36.3 C)-98.6 F (37 C)] 98 F (36.7 C) (02/27 0900) Pulse Rate:  [60-86] 60 (02/27 0825) Resp:  [11-24] 18 (02/27 0825) BP: (147-229)/(60-90) 187/68 (02/27 0900) SpO2:  [92 %-100 %] 100 % (02/27 0825) Weight:  [57.3 kg] 57.3 kg (02/27 0342)  General - Well nourished, well developed, in no apparent distress. Cardiovascular - Sinus tachycardia on monitor.   Mental Status -  Level of arousal and orientation to time, place, and person were intact. Language including expression, naming, repetition, comprehension was assessed and found intact.  Cranial Nerves II - XII - II - Visual field intact OU. III, IV, VI - Extraocular movements intact. C/o continued dizziness with any EOM.  Few beats of end gaze nystagmus on  left gaze V - Facial sensation intact bilaterally. VII - Facial movement intact bilaterally. VIII - Hearing & vestibular intact bilaterally. X - Palate elevates symmetrically. XI - Chin turning & shoulder shrug intact bilaterally. XII - Tongue protrusion intact.  Motor - Generalized weakness in all extremities, pronator drift was absent.  Bulk and tone normal and fasciculations were absent.    Sensory - Light touch assessed and symmetrical.    Coordination -mild left finger-to-nose ataxia.  Left knee to heel coordination slow.  Normal coordination on the right Gait and Station - deferred.   ASSESSMENT/PLAN Sherry [name] is a 89 y.o. female with history of uncontrolled hypertension, breast cancer, syncope, chronic neck pain presenting with presented to the emergency room for sudden onset of headache and just a general sensation of not feeling well upon awakening . CT angiogram revealed occlusion of the left vertebral artery, V2 level onwards with reconstitution proximal to the vertebrobasilar junction. MRI brain revealed left cerebellar stroke with a possible small area of restricted diffusion in the right occipital lobe. IR procedure cancelled due to benefit vs risk stratification.   Acute Left posterior inferior cerebellar Infarct  Etiology:  occluded left  vertebral artery secondary to atherosclerosis and uncontrolled hypertension Code Stroke CT head: No acute findings. No intracranial mass, hemorrhage or edema. Chronic small vessel ischemic changes in the white matter. CTA head & neck: Occluded Left Vertebral Artery in the V2 segment, appears new since November and with reconstitution only at the vertebrobasilar junction. This could be secondary to vertebral artery dissection or atherosclerosis occlusion (see #2). Little atherosclerosis in the neck, but evidence of Advanced Intracranial Atherosclerosis with widespread Moderate And Severe 2nd and 3rd COW branch stenoses. No  discrete branch occlusion MRI    Acute infarct in the medial aspect of the left cerebellar hemisphere. Additional region of increased signal on diffusion-weighted imaging in the medial aspect of the right occipital lobe, favored to represent an additional site of an acute to subacute infarct. Absent flow void in the V4 segment of the left vertebral artery, in keeping with known occlusion seen on same day CTA head/neck MRI November 2023:  No acute intracranial infarct or other abnormality. Stable brain MRI with chronic microvascular ischemic disease and chronic right frontal encephalomalacia. 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 No hemorrhagic transformation or posterior fossa mass effect at this time. No other acute intracranial abnormality by CT. Chronic supratentorial small and medium-sized vessel ischemia  2D Echo: LVEF 70-75%, Severe LVH, Grade I diastolic dysfunction, Mildly elevated PASP, Mild MVR, Trivial AVR  LDL 122 HgbA1c: 5.6 VTE prophylaxis - lovenox    Diet   Diet regular Room service appropriate? Yes; Fluid consistency: Thin   No antithrombotic prior to admission, now on aspirin 81 mg daily and clopidogrel 75 mg daily.  for 3 months, followed by aspirin alone.  Therapy recommendations:  AIR Disposition:  pending  Hypertension Home meds:  Toprol-XL '25mg'$ , Norvasc '10mg'$ , Hydralazine '50mg'$ , HCTZ '25mg'$ . Restarted.  UnStable. Per chart review, Patient's spouse states she is not taking her anti-hypertensive medications Permissive hypertension (OK if < 220/120) but gradually normalize in 5-7 days Long-term BP goal normotensive  Hyperlipidemia Home meds:  none LDL 122, goal < 70 Add Lipitor '20mg'$  due to known vascular disease and LD not in goal  Continue statin at discharge   Other Stroke Risk Factors Advanced Age >/= 32   Other Active Problems Nausea Zofran, Reglan added PRN Hx of  syncope Previous Holter Monitor monitoring 12/2021 for syncope: NSR with episodes of tachycardia up to 120. Toprol-XL '25mg'$  daily added after this. Hx of chronic neck pain Hx of Breast Cancer  Hospital day # 2   Pt seen by Neuro NP/APP and later by MD. Note/plan to be edited by MD as needed.    Otelia Santee, DNP, AGACNP-BC [facility] Please use AMION for pager and EPIC for messaging  I have personally obtained history,examined this patient, reviewed notes, independently viewed imaging studies, participated in medical decision making and plan of care.ROS completed by me personally and pertinent positives fully documented  I have made any additions or clarifications directly to the above note. Agree with note above.  Patient continues to have moderate headache as well as nausea and dizziness.  Recommend Tylenol for headache and try Phenergan or Reglan since Zofran is not helping.  Mobilize out of bed.  Therapy consults.  She will likely need inpatient rehab.  No family available at the bedside.  Greater than 50% time during this 35-minute visit was spent in counseling and coordination of care about cerebellar stroke and nausea and headache and answering  questions discussion with care team. Antony Contras, MD Medical Director [place] Pager: [phone] 04/27/2022 2:20 PM   To contact Stroke Continuity provider, please refer to [email or web address] After hours, contact General Neurology

## 2022-04-27 NOTE — Progress Notes (Signed)
PROGRESS NOTE    Sherry [name]  [id number] DOB: 08-04-1930 DOA: 04/25/2022 PCP: Rusty Aus, MD   Brief Narrative:  89 y/o F who presented to [facility] on 2/25 with reports of nausea, vomiting and headache.  She reports the hospital for several falls over the past few months, reports concussion December 2023.  Reports fall just last week prior to admission but awoke the next day with severe headache nausea vomiting and profound dizziness and vertiginous symptoms.  Initially admitted to ICU under neurology due to profound hypotension with notable left posterior inferior cerebellar artery infarct as well as occluded left vertebral artery.  Patient transition to hospitalist services on 04/27/2022 given improvement, currently awaiting disposition, likely to inpatient rehab versus SNF given her ongoing profound symptoms of dizziness/vertigo and ambulatory dysfunction.  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 -Echo without overt acute findings -Lipid panel markedly abnormal with markedly elevated cholesterol triglycerides LDL -A1c 5.6 -Neurology recommending 90 days of aspirin and Plavix followed by aspirin alone. -Disposition likely to inpatient rehab per PT recommendations   Hypertensive emergency -As above permissive hypertension per neurology -Continue to improve blood pressure over the next 24 to 48 hours -Current regimen includes amlodipine, hydralazine, metoprolol -will uptitrate or add on medications as appropriate   Leukocytosis, reactive Lactic acidosis -Likely secondary to above no signs or symptoms of infection at this time  -Cultures remain  negative to date  Intractable nausea vomiting diarrhea  Intractable vertigo/dizziness -Patient symptoms continue to wax and wane but generally not improving, continue Zofran, attempted Phenergan with increased somnolence, neurology attempting trial of scheduled Reglan    R Breast Nodule / Mass Hx R Breast Cancer s/p Lumpectomy  -will need follow up with [facility] as outpatient after discharge for further evaluation   DVT prophylaxis: Lovenox Code Status: Full Family Communication: None present  Status is: Inpatient  Dispo: The patient is from: Home              Anticipated d/c is to: To be determined -current recommendations inpatient rehab              Anticipated d/c date is: 24 to 48 hours              Patient currently not medically stable for discharge  Consultants:  Neurology  Procedures:  None  Antimicrobials:  None indicated  Subjective: No acute issues or events overnight, patient continues to complain of dizziness nausea vomiting poorly controlled by current regimen.  Otherwise denies diarrhea constipation headache fevers chills chest pain or shortness of breath.  Objective: Vitals:   04/27/22 0342 04/27/22 0825 04/27/22 0900 04/27/22 1204  BP:  (!) 229/80 (!) 187/68 (!) 184/72  Pulse:  60  99  Resp:  18  18  Temp:  98.2 F (36.8 C) 98 F (36.7 C) 98.4 F (36.9 C)  TempSrc:  Oral Oral Oral  SpO2:  100%  96%  Weight: 57.3 kg       Intake/Output Summary (Last 24 hours) at 04/27/2022 1252 Last data filed at 04/27/2022 0500 Gross per 24 hour  Intake 363.38 ml  Output 550 ml  Net -186.62 ml   Filed Weights   04/26/22 0731 04/27/22 0342  Weight: 56.2 kg 57.3 kg    Examination:  General:  Pleasantly resting in bed, No acute distress. HEENT:  Normocephalic atraumatic.  Sclerae nonicteric, noninjected.  Extraocular movements intact bilaterally. Neck:  Without mass or deformity.  Trachea is midline. Lungs:  Clear to auscultate bilaterally without rhonchi,  wheeze, or rales. Heart:  Regular rate and rhythm.  Without murmurs, rubs, or gallops. Abdomen:  Soft, nontender, nondistended.  Without guarding or rebound. Extremities: Without cyanosis, clubbing, edema, or obvious deformity.  Data Reviewed: I have personally reviewed following labs and imaging studies  CBC: Recent Labs  Lab 04/25/22 0948 04/26/22 0320  WBC 17.8* 17.5*  NEUTROABS 7.5  --   HGB 14.6 14.0  HCT 43.9 40.8  MCV 88.3 87.6  PLT 418* [id number]   Basic Metabolic Panel: Recent Labs  Lab 04/25/22 0948 04/26/22 0320  NA 135 133*  K 3.4* 3.5  CL 100 97*  CO2 23 22  GLUCOSE 141* 131*  BUN 30* 19  CREATININE 0.65 0.64  CALCIUM 9.4 8.9  MG  --  1.4*  PHOS  --  3.0   GFR: Estimated Creatinine Clearance: 37.7 mL/min (by C-G formula based on SCr of 0.64 mg/dL). Liver Function Tests: Recent Labs  Lab 04/25/22 0948  AST 26  ALT 23  ALKPHOS 43  BILITOT 0.9  PROT 7.0  ALBUMIN 4.1   Recent Labs  Lab 04/25/22 0948  LIPASE 43   No results for input(s): "AMMONIA" in the last 168 hours. Coagulation Profile: Recent Labs  Lab 04/26/22 1035  INR 0.9   Cardiac Enzymes: No results for input(s): "CKTOTAL", "CKMB", "CKMBINDEX", "TROPONINI" in the last 168 hours. BNP (last 3 results) No results for input(s): "PROBNP" in the last 8760 hours. HbA1C: Recent Labs    04/26/22 0320  HGBA1C 5.6   CBG: No results for input(s): "GLUCAP" in the last 168 hours. Lipid Profile: Recent Labs    04/26/22 0320 04/26/22 0818  CHOL 242* 233*  HDL 47 53  LDLCALC UNABLE TO CALCULATE IF TRIGLYCERIDE OVER 400 mg/dL 122*  TRIG 424* 292*  CHOLHDL 5.1 4.4  LDLDIRECT 114*  --    Thyroid Function Tests: No results for input(s): "TSH", "T4TOTAL", "FREET4", "T3FREE", "THYROIDAB" in the last 72 hours. Anemia Panel: No results for input(s): "VITAMINB12", "FOLATE", "FERRITIN", "TIBC", "IRON", "RETICCTPCT" in the last 72 hours. Sepsis Labs: Recent Labs  Lab 04/25/22 0948  04/25/22 1148 04/26/22 0320 04/26/22 0818  PROCALCITON  --   --   --  0.10  LATICACIDVEN 2.5* 2.5* 2.0*  --     Recent Results (from the past 240 hour(s))  MRSA Next Gen by PCR, Nasal     Status: None   Collection Time: 04/25/22  5:23 PM   Specimen: Nasal Mucosa; Nasal Swab  Result Value Ref Range Status   MRSA by PCR Next Gen NOT DETECTED NOT DETECTED Final    Comment: (NOTE) The GeneXpert MRSA Assay (FDA approved for NASAL specimens only), is one component of a comprehensive MRSA colonization surveillance program. It is not intended to diagnose MRSA infection nor to guide or monitor treatment for MRSA infections. Test performance is not FDA approved in patients less than 90 years old. Performed at [facility], [place] [address] [place], [place] [address]   Culture, blood (Routine X 2) w Reflex to ID Panel     Status: None (Preliminary result)   Collection Time: 04/26/22  8:18 AM   Specimen: BLOOD RIGHT HAND  Result Value Ref Range  Status   Specimen Description BLOOD RIGHT HAND  Final   Special Requests   Final    BOTTLES DRAWN AEROBIC AND ANAEROBIC Blood Culture adequate volume   Culture   Final    NO GROWTH < 24 HOURS Performed at [facility], [place] [address], [place], [place] [address]    Report Status PENDING  Incomplete  Culture, blood (Routine X 2) w Reflex to ID Panel     Status: None (Preliminary result)   Collection Time: 04/26/22  8:23 AM   Specimen: BLOOD RIGHT HAND  Result Value Ref Range Status   Specimen Description BLOOD RIGHT HAND  Final   Special Requests   Final    BOTTLES DRAWN AEROBIC AND ANAEROBIC Blood Culture adequate volume   Culture   Final    NO GROWTH < 24 HOURS Performed at [facility], [place] [address], [place], [place] [address]    Report Status PENDING  Incomplete         Radiology Studies: ECHOCARDIOGRAM COMPLETE  Result Date: 04/26/2022    ECHOCARDIOGRAM REPORT   Patient Name:   ELYA DAINTY Date of Exam:  04/26/2022 Medical Rec #:  [id number]         Height:       62.0 in Accession #:    [id number]        Weight:       123.9 lb Date of Birth:  06-03-1930        BSA:          1.560 m Patient Age:    53 years          BP:           169/63 mmHg Patient Gender: F                 HR:           85 bpm. Exam Location:  Inpatient Procedure: 2D Echo, 3D Echo, Cardiac Doppler and Color Doppler Indications:    Stroke  History:        Patient has no prior history of Echocardiogram examinations.                 Abnormal ECG, Signs/Symptoms:Chest Pain and Dyspnea; Risk                 Factors:Hypertension and Dyslipidemia.  Sonographer:    Roseanna Rainbow RDCS Referring Phys: [id number] [place]  1. There is a mid cavity gradient of 75mHg related to hyperdynamic LVF and mid cavity obliteration during systole. Left ventricular ejection fraction, by estimation, is 70 to 75%. Left ventricular ejection fraction by 3D volume is 72 %. The left ventricle has hyperdynamic function. The left ventricle has no regional wall motion abnormalities. There is severe left ventricular hypertrophy of the basal segment. Left ventricular diastolic parameters are consistent with Grade I diastolic dysfunction (impaired relaxation).  2. Right ventricular systolic function is normal. The right ventricular size is normal. There is mildly elevated pulmonary artery systolic pressure. The estimated right ventricular systolic pressure is 3Q000111QmmHg.  3. The mitral valve is normal in structure. Mild mitral valve regurgitation. No evidence of mitral stenosis.  4. The aortic valve is tricuspid. Aortic valve regurgitation is trivial. Aortic valve sclerosis/calcification is present, without any evidence of aortic stenosis. Aortic valve area, by VTI measures 3.65 cm. Aortic valve mean gradient measures 7.0 mmHg.  Aortic valve Vmax measures 1.79 m/s.  5. The inferior vena  cava is normal in size with greater than 50% respiratory variability, suggesting  right atrial pressure of 3 mmHg. Conclusion(s)/Recommendation(s): No intracardiac source of embolism detected on this transthoracic study. Consider a transesophageal echocardiogram to exclude cardiac source of embolism if clinically indicated. FINDINGS  Left Ventricle: There is a mid cavity gradient of 66mHg related to hyperdynamic LVF and mid cavity obliteration during systole. Left ventricular ejection fraction, by estimation, is 70 to 75%. Left ventricular ejection fraction by 3D volume is 72 %. The left ventricle has hyperdynamic function. The left ventricle has no regional wall motion abnormalities. The left ventricular internal cavity size was normal in size. There is severe left ventricular hypertrophy of the basal segment. Left ventricular diastolic parameters are consistent with Grade I diastolic dysfunction (impaired relaxation). Normal left ventricular filling pressure. Right Ventricle: The right ventricular size is normal. No increase in right ventricular wall thickness. Right ventricular systolic function is normal. There is mildly elevated pulmonary artery systolic pressure. The tricuspid regurgitant velocity is 2.92  m/s, and with an assumed right atrial pressure of 3 mmHg, the estimated right ventricular systolic pressure is 3Q000111QmmHg. Left Atrium: Left atrial size was normal in size. Right Atrium: Right atrial size was normal in size. Pericardium: There is no evidence of pericardial effusion. Mitral Valve: The mitral valve is normal in structure. Mild mitral valve regurgitation. No evidence of mitral valve stenosis. MV peak gradient, 7.6 mmHg. The mean mitral valve gradient is 3.0 mmHg. Tricuspid Valve: The tricuspid valve is normal in structure. Tricuspid valve regurgitation is mild . No evidence of tricuspid stenosis. Aortic Valve: The aortic valve is tricuspid. Aortic valve regurgitation is trivial. Aortic valve sclerosis/calcification is present, without any evidence of aortic stenosis. Aortic  valve mean gradient measures 7.0 mmHg. Aortic valve peak gradient measures 12.8 mmHg. Aortic valve area, by VTI measures 3.65 cm. Pulmonic Valve: The pulmonic valve was normal in structure. Pulmonic valve regurgitation is not visualized. No evidence of pulmonic stenosis. Aorta: The aortic root is normal in size and structure. Venous: The inferior vena cava is normal in size with greater than 50% respiratory variability, suggesting right atrial pressure of 3 mmHg. IAS/Shunts: The interatrial septum appears to be lipomatous. No atrial level shunt detected by color flow Doppler.  LEFT VENTRICLE PLAX 2D LVIDd:         2.90 cm         Diastology LVIDs:         1.70 cm         LV e' medial:    5.11 cm/s LV PW:         1.10 cm         LV E/e' medial:  17.2 LV IVS:        1.60 cm         LV e' lateral:   9.36 cm/s LVOT diam:     2.30 cm         LV E/e' lateral: 9.4 LV SV:         106 LV SV Index:   68 LVOT Area:     4.15 cm        3D Volume EF                                LV 3D EF:    Left  ventricul                                             ar                                             ejection                                             fraction                                             by 3D                                             volume is                                             72 %.                                 3D Volume EF:                                3D EF:        72 %                                LV EDV:       87 ml                                LV ESV:       24 ml                                LV SV:        62 ml RIGHT VENTRICLE             IVC RV S prime:     11.50 cm/s  IVC diam: 1.50 cm TAPSE (M-mode): 1.5 cm LEFT ATRIUM           Index        RIGHT ATRIUM          Index LA diam:      2.60 cm 1.67 cm/m   RA Area:     7.08 cm LA Vol (A2C): 41.7 ml 26.74 ml/m  RA Volume:   11.30 ml 7.25 ml/m LA Vol (A4C): 13.4 ml 8.59 ml/m  AORTIC VALVE AV  Area (Vmax):    3.41 cm AV Area (Vmean):  3.39 cm AV Area (VTI):     3.65 cm AV Vmax:           179.00 cm/s AV Vmean:          117.000 cm/s AV VTI:            0.289 m AV Peak Grad:      12.8 mmHg AV Mean Grad:      7.0 mmHg LVOT Vmax:         147.00 cm/s LVOT Vmean:        95.600 cm/s LVOT VTI:          0.254 m LVOT/AV VTI ratio: 0.88  AORTA Ao Root diam: 2.90 cm Ao Asc diam:  3.00 cm MITRAL VALVE                TRICUSPID VALVE MV Area (PHT): 2.48 cm     TR Peak grad:   34.1 mmHg MV Area VTI:   3.05 cm     TR Vmax:        292.00 cm/s MV Peak grad:  7.6 mmHg MV Mean grad:  3.0 mmHg     SHUNTS MV Vmax:       1.38 m/s     Systemic VTI:  0.25 m MV Vmean:      82.1 cm/s    Systemic Diam: 2.30 cm MV Decel Time: 307 msec MV E velocity: 88.10 cm/s MV A velocity: 125.50 cm/s MV E/A ratio:  0.70 Fransico Him MD Electronically signed by Fransico Him MD Signature Date/Time: 04/26/2022/12:12:26 PM    Final    DG Chest Port 1 View  Result Date: 04/26/2022 CLINICAL DATA:  CVA. EXAM: PORTABLE CHEST 1 VIEW COMPARISON:  Portable yesterday 2:50 p.m. FINDINGS: The heart size and mediastinal contours are within normal limits. There is aortic atherosclerosis. Both lungs are clear. The visualized skeletal structures are intact. The is advanced degenerative change of the shoulders with chronic rotator cuff arthropathy. Right axillary surgical clips. IMPRESSION: No active disease. Aortic atherosclerosis. Electronically Signed   By: Telford Nab M.D.   On: 04/26/2022 06:36   CT HEAD WO CONTRAST (5MM)  Result Date: 04/26/2022 CLINICAL DATA:  89 year old female presenting with headache, vomiting, hypertension. 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 EXAM: CT HEAD WITHOUT CONTRAST TECHNIQUE: Contiguous axial images were obtained from the base of the skull through the vertex without intravenous contrast. RADIATION DOSE REDUCTION: This exam was performed according to the departmental dose-optimization  program which includes automated exposure control, adjustment of the mA and/or kV according to patient size and/or use of iterative reconstruction technique. COMPARISON:  Brain MRI, head CT, CTA head and neck yesterday. FINDINGS: Brain: Cytotoxic edema in the left cerebellum PICA territory is now evident by CT corresponding to the MRI appearance. No hemorrhagic transformation. Basilar cisterns remain patent, no posterior fossa mass effect at this time. Supratentorial gray-white differentiation remains stable, no convincing cytotoxic edema elsewhere. Chronic small and medium-sized vessel ischemic changes in both hemispheres. No midline shift, ventriculomegaly, mass effect, evidence of mass lesion, intracranial hemorrhage. Vascular: Calcified atherosclerosis at the skull base. Skull: No acute osseous abnormality identified. Sinuses/Orbits: Stable mild paranasal sinus opacity, stables mild paranasal sinus bubbly opacity and mucosal thickening. Tympanic cavities and mastoids remain clear. Other: No acute orbit or scalp soft tissue finding. IMPRESSION: 1. Expected CT appearance of Left cerebellum PICA infarct. No hemorrhagic transformation or posterior fossa mass effect at this time. 2. No other acute intracranial abnormality  by CT. 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 Electronically Signed   By: Genevie Ann M.D.   On: 04/26/2022 05:37   DG Chest Port 1 View  Result Date: 04/25/2022 CLINICAL DATA:  Systemic inflammatory response syndrome. EXAM: PORTABLE CHEST 1 VIEW COMPARISON:  Chest radiographs 01/03/2022 FINDINGS: Cardiac silhouette and mediastinal contours are within normal limits. The lungs are clear. No pleural effusion or pneumothorax. Right axillary surgical clips are again seen.) Right-greater-than-left axillary calcific foci as seen on prior CT and unchanged from 01/03/2022 radiographs, likely calcified lymph nodes as can be seen following prior remote granulomatous infection.  IMPRESSION: No active disease. Electronically Signed   By: Yvonne Kendall M.D.   On: 04/25/2022 15:09   MR BRAIN WO CONTRAST  Result Date: 04/25/2022 CLINICAL DATA:  Stroke suspected EXAM: MRI HEAD WITHOUT CONTRAST TECHNIQUE: Multiplanar, multiecho pulse sequences of the brain and surrounding structures were obtained without intravenous contrast. COMPARISON:  Same day CT Head and CTA head/neck FINDINGS: Brain: Acute infarcts in the medial aspect of the left cerebellar hemisphere. There is an additional subtle region of increased signal on diffusion-weighted imaging in the medial aspect of the right occipital lobe (series 5, image 27), favored to represent an additional site of an acute to subacute infarct. Sequela of moderate chronic microvascular ischemic change. No hemorrhage. No hydrocephalus. Vascular: See same day CTA head and neck angiogram for vascular findings. Redemonstrated absent flow void in the V4 segment of the left vertebral artery. Skull and upper cervical spine: Redemonstrated is a chronic appearing type 2 dens fracture. Sinuses/Orbits: Bilateral lens replacement. Trace right mastoid effusion. Trace mucosal thickening in the posterior ethmoid air cells on the right. Other: None. IMPRESSION: 1. 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 2. Additional region of increased signal on diffusion-weighted imaging in the medial aspect of the right occipital lobe, favored to represent an additional site of an acute to subacute infarct. 3. Absent flow void in the V4 segment of the left vertebral artery, in keeping with known occlusion seen on same day CTA head/neck Electronically Signed   By: Marin Roberts M.D.   On: 04/25/2022 13:59    Scheduled Meds:  amLODipine  5 mg Oral Daily   aspirin EC  81 mg Oral Daily   atorvastatin  40 mg Oral Daily   Chlorhexidine Gluconate Cloth  6 each Topical Daily   clopidogrel  75 mg Oral Daily   enoxaparin (LOVENOX) injection  40 mg  Subcutaneous Q24H   hydrALAZINE  50 mg Oral Q8H   metoCLOPramide (REGLAN) injection  5 mg Intravenous Q12H   metoprolol succinate  25 mg Oral Daily   Continuous Infusions:   LOS: 2 days   Time spent: 23mn  Randie Bloodgood C Carianna Lague, DO [facility]  If 7PM-7AM, please contact night-coverage [email or web address]  04/27/2022, 12:52 PM

## 2022-04-27 NOTE — Plan of Care (Signed)
  Problem: Fluid Volume: Goal: Hemodynamic stability will improve Outcome: Progressing   Problem: Clinical Measurements: Goal: Diagnostic test results will improve Outcome: Progressing Goal: Signs and symptoms of infection will decrease Outcome: Progressing   Problem: Respiratory: Goal: Ability to maintain adequate ventilation will improve Outcome: Progressing   Problem: Education: Goal: Knowledge of disease or condition will improve Outcome: Progressing Goal: Knowledge of secondary prevention will improve (MUST DOCUMENT ALL) Outcome: Progressing Goal: Knowledge of patient specific risk factors will improve Elta [place] N/A or DELETE if not current risk factor) Outcome: Progressing   Problem: Ischemic Stroke/TIA Tissue Perfusion: Goal: Complications of ischemic stroke/TIA will be minimized Outcome: Progressing   Problem: Coping: Goal: Will verbalize positive feelings about self Outcome: Progressing Goal: Will identify appropriate support needs Outcome: Progressing   Problem: Health Behavior/Discharge Planning: Goal: Ability to manage health-related needs will improve Outcome: Progressing Goal: Goals will be collaboratively established with patient/family Outcome: Progressing   Problem: Self-Care: Goal: Ability to participate in self-care as condition permits will improve Outcome: Progressing Goal: Verbalization of feelings and concerns over difficulty with self-care will improve Outcome: Progressing Goal: Ability to communicate needs accurately will improve Outcome: Progressing   Problem: Nutrition: Goal: Risk of aspiration will decrease Outcome: Progressing Goal: Dietary intake will improve Outcome: Progressing   Problem: Education: Goal: Knowledge of General Education information will improve Description: Including pain rating scale, medication(s)/side effects and non-pharmacologic comfort measures Outcome: Progressing   Problem: Health Behavior/Discharge  Planning: Goal: Ability to manage health-related needs will improve Outcome: Progressing   Problem: Clinical Measurements: Goal: Ability to maintain clinical measurements within normal limits will improve Outcome: Progressing Goal: Will remain free from infection Outcome: Progressing Goal: Diagnostic test results will improve Outcome: Progressing Goal: Respiratory complications will improve Outcome: Progressing Goal: Cardiovascular complication will be avoided Outcome: Progressing   Problem: Activity: Goal: Risk for activity intolerance will decrease Outcome: Progressing   Problem: Nutrition: Goal: Adequate nutrition will be maintained Outcome: Progressing   Problem: Coping: Goal: Level of anxiety will decrease Outcome: Progressing   Problem: Elimination: Goal: Will not experience complications related to bowel motility Outcome: Progressing Goal: Will not experience complications related to urinary retention Outcome: Progressing   Problem: Pain Managment: Goal: General experience of comfort will improve Outcome: Progressing   Problem: Safety: Goal: Ability to remain free from injury will improve Outcome: Progressing   Problem: Skin Integrity: Goal: Risk for impaired skin integrity will decrease Outcome: Progressing  Patient admitted after a fall and stroke alert x4 able to make needs known

## 2022-04-28 DIAGNOSIS — D72829 Elevated white blood cell count, unspecified: Secondary | ICD-10-CM | POA: Diagnosis not present

## 2022-04-28 DIAGNOSIS — I63112 Cerebral infarction due to embolism of left vertebral artery: Secondary | ICD-10-CM | POA: Diagnosis not present

## 2022-04-28 DIAGNOSIS — R112 Nausea with vomiting, unspecified: Secondary | ICD-10-CM | POA: Diagnosis not present

## 2022-04-28 DIAGNOSIS — I161 Hypertensive emergency: Secondary | ICD-10-CM | POA: Diagnosis not present

## 2022-04-28 MED ORDER — KETOROLAC TROMETHAMINE 15 MG/ML IJ SOLN
15.0000 mg | INTRAMUSCULAR | Status: AC
  Administered 2022-04-28: 15 mg via INTRAVENOUS
  Filled 2022-04-28: qty 1

## 2022-04-28 MED ORDER — METOCLOPRAMIDE HCL 5 MG/ML IJ SOLN
5.0000 mg | INTRAMUSCULAR | Status: AC
  Administered 2022-04-28: 5 mg via INTRAVENOUS
  Filled 2022-04-28: qty 2

## 2022-04-28 MED ORDER — DIPHENHYDRAMINE HCL 50 MG/ML IJ SOLN
12.5000 mg | INTRAMUSCULAR | Status: AC
  Administered 2022-04-28: 12.5 mg via INTRAVENOUS
  Filled 2022-04-28: qty 1

## 2022-04-28 NOTE — Hospital Course (Addendum)
Sherry [name] is a 89 y.o. female with a history of hypertension, recurrent falls, breast cancer status postlumpectomy and radiation.  Patient presented secondary to headache, nausea, vomiting with associated dizziness.  MRI imaging confirmed presence of acute cerebellar infarct and CT angiogram of the head and neck was significant for left vertebral artery occlusion.  Patient was admitted for hypertensive emergency and acute stroke, initially requiring ICU admission and Cleviprex drip.  For antihypertensives restarted and Cleviprex weaned off.  Patient with intractable vertigo/nausea secondary to acute infarct.  Physical therapy recommending acute inpatient rehab on discharge, however due to insurance approval, patient was approved to discharge to SNF.

## 2022-04-28 NOTE — Progress Notes (Signed)
   04/27/22 2339  Assess: MEWS Score  Temp 98.2 F (36.8 C)  BP (!) 220/83  MAP (mmHg) 123  Pulse Rate 78  ECG Heart Rate 78  Resp 16  Level of Consciousness Alert  SpO2 97 %  O2 Device Room Air  Patient Activity (if Appropriate) In bed  Assess: MEWS Score  MEWS Temp 0  MEWS Systolic 2  MEWS Pulse 0  MEWS RR 0  MEWS LOC 0  MEWS Score 2  MEWS Score Color Yellow  Assess: if the MEWS score is Yellow or Red  Were vital signs taken at a resting state? Yes  Focused Assessment No change from prior assessment  Does the patient meet 2 or more of the SIRS criteria? No  MEWS guidelines implemented  Yes, yellow  Treat  MEWS Interventions Considered administering scheduled or prn medications/treatments as ordered  Take Vital Signs  Increase Vital Sign Frequency  Yellow: Q2hr x1, continue Q4hrs until patient remains green for 12hrs  Escalate  MEWS: Escalate Yellow: Discuss with charge nurse and consider notifying provider and/or RRT  Notify: Charge Nurse/RN  Name of Charge Nurse/RN Notified Phyl RN  Provider Notification  Provider Name/Title Dr. Aileen Fass  Date Provider Notified 04/28/22  Time Provider Notified 0009  Method of Notification Page  Notification Reason Other (Comment) (yellow mews d/t bp. cont'd HA, nausea, dizzy)  Provider response See new orders  Date of Provider Response 04/28/22  Time of Provider Response 0016  Assess: SIRS CRITERIA  SIRS Temperature  0  SIRS Pulse 0  SIRS Respirations  0  SIRS WBC 0  SIRS Score Sum  0

## 2022-04-28 NOTE — Care Management Important Message (Signed)
Important Message  Patient Details  Name: Sherry [name] MRN: [id number] Date of Birth: 08-02-1930   Medicare Important Message Given:  Yes     Hannah Beat 04/28/2022, 11:28 AM

## 2022-04-28 NOTE — Progress Notes (Addendum)
On first assessment at 2100 pt c/o headache, nausea and dizziness "since Sunday" and says has been this way all day. Refusing scheduled reglan stating "nothing helps and all the medicines make it worse". Refusing PO meds d/t nausea.  Night MD notified. Orders received and pt agreeable to try compazine and toradol. PRN IV hydralazine given for SBP 220. No BM since 2/24. Abdomen distended but soft, active bowel sounds and passing small amts gas. Pt willing to try suppository. No BM after suppository and no improvement in HA or nausea. Pt anxious, and reports "trembling". Pt appears to be slightly shivering room cold and temp 97.6; blankets placed on pt and trembling/shivering resolved.   Discussed with MD: orders for benadryl, toradol and reglan and pt willing to try.  At 0245, slight improvement in nausea and headache. BP improved, pt appears more comfortable and not anxious. Pt says she feels comfortable and wants to try to sleep. MD updated.   Update around 0600: pt reports she was able to sleep awhile. Had small BM on bed pan per NT. Still nauseas but not as bad. Headache still 7/10. Willing to take PO hydralazine and PO tylenol; tolerated well taking meds with ginger ale.

## 2022-04-28 NOTE — Progress Notes (Signed)
PROGRESS NOTE    Sherry [name]  [id number] DOB: March 16, 1930 DOA: 04/25/2022 PCP: Rusty Aus, MD   Brief Narrative: Sherry [name] is a 89 y.o. female with a history of hypertension, recurrent falls, breast cancer status postlumpectomy and radiation.  Patient presented secondary to headache, nausea, vomiting with associated dizziness.  MRI imaging confirmed presence of acute cerebellar infarct and CT angiogram of the head and neck was significant for left vertebral artery occlusion.  Patient was admitted for hypertensive emergency and acute stroke, initially requiring ICU admission and Cleviprex drip.  For antihypertensives restarted and Cleviprex weaned off.  Patient with intractable vertigo/nausea secondary to acute infarct.  Physical therapy recommending acute inpatient rehab on discharge.   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 MRI confirms acute infarct of medial aspect of left cerebellar hemisphere in addition to likely acute/subacute infarct of the medial aspect of the right occipital lobe. CTA head and neck significant for an occluded left vertebral artery in the V2 segment. LDL of 122. Hemoglobin A1C of 5.6%. Transthoracic Echocardiogram significant for no atrial level shunt. Neurology recommendations for aspirin indefinitely and Plavix 75 mg for three months. PT/OT recommendations for acute inpatient rehabilitation.  Hypertensive emergency Per history, patient's outpatient antihypertensives discontinued in December. Patient managed initially on Cleviprex and able to wean off. Currently stable/uncontrolled on amlodipine, hydralazine and metoprolol succinate  Leukocytosis No source for infection noted. -Recheck CBC in AM  Lactic acidosis Present on admission. Improved with IV fluids.  Intractable nausea/vomiting Intractable vertigo Secondary to acute stroke. Therapies have not been  successful in treating symptoms. -Continue Reglan scheduled  Right breast nodule/mass 19 mm mixed density nodule/mass noted on CT chest. Patient with a history of right breast cancer s/p lumpectomy. Patient will need follow-up for nodule with mammography.   DVT prophylaxis: Lovenox Code Status:   Code Status: Full Code Family Communication: None at bedside Disposition Plan: Discharge to acute inpatient rehabilitation if accepted.   Consultants:  PCCM Neurology  Procedures:  2/26: Transthoracic Echocardiogram  Antimicrobials: None    Subjective: Patient reports persistent vertigo symptoms with associated nausea. Vertigo is leftward moving. No other concerns.  Objective: BP (!) 196/79 (BP Location: Left Arm)   Pulse 84   Temp 97.9 F (36.6 C) (Oral)   Resp 18   Wt 57.3 kg   SpO2 97%   BMI 23.10 kg/m   Examination:  General exam: Appears calm and comfortable Respiratory system: Clear to auscultation. Respiratory effort normal. Cardiovascular system: S1 & S2 heard, RRR. Gastrointestinal system: Abdomen is nondistended, soft and nontender. No organomegaly or masses felt. Normal bowel sounds heard. Central nervous system: Alert and oriented. Musculoskeletal: No edema. No calf tenderness   Data Reviewed: I have personally reviewed following labs and imaging studies  CBC Lab Results  Component Value Date   WBC 17.5 (H) 04/26/2022   RBC 4.66 04/26/2022   HGB 14.0 04/26/2022   HCT 40.8 04/26/2022   MCV 87.6 04/26/2022   MCH 30.0 04/26/2022   PLT 373 04/26/2022   MCHC 34.3 04/26/2022   RDW 14.0 04/26/2022   LYMPHSABS 8.8 (H) 04/25/2022   MONOABS 0.9 04/25/2022   EOSABS 0.1 04/25/2022   BASOSABS 0.1 [id number]     Last metabolic panel Lab Results  Component Value Date   NA 133 (L) 04/26/2022   K 3.5 04/26/2022   CL 97 (L) 04/26/2022   CO2 22 04/26/2022   BUN 19 04/26/2022  CREATININE 0.64 04/26/2022   GLUCOSE 131 (H) 04/26/2022   GFRNONAA >60  04/26/2022   GFRAA >60 08/15/2017   CALCIUM 8.9 04/26/2022   PHOS 3.0 04/26/2022   PROT 7.0 04/25/2022   ALBUMIN 4.1 04/25/2022   BILITOT 0.9 04/25/2022   ALKPHOS 43 04/25/2022   AST 26 04/25/2022   ALT 23 04/25/2022   ANIONGAP 14 04/26/2022    GFR: Estimated Creatinine Clearance: 37.7 mL/min (by C-G formula based on SCr of 0.64 mg/dL).  Recent Results (from the past 240 hour(s))  MRSA Next Gen by PCR, Nasal     Status: None   Collection Time: 04/25/22  5:23 PM   Specimen: Nasal Mucosa; Nasal Swab  Result Value Ref Range Status   MRSA by PCR Next Gen NOT DETECTED NOT DETECTED Final    Comment: (NOTE) The GeneXpert MRSA Assay (FDA approved for NASAL specimens only), is one component of a comprehensive MRSA colonization surveillance program. It is not intended to diagnose MRSA infection nor to guide or monitor treatment for MRSA infections. Test performance is not FDA approved in patients less than 53 years old. Performed at [facility], [place] [address], [place], [place] [address]   Culture, blood (Routine X 2) w Reflex to ID Panel     Status: None (Preliminary result)   Collection Time: 04/26/22  8:18 AM   Specimen: BLOOD RIGHT HAND  Result Value Ref Range Status   Specimen Description BLOOD RIGHT HAND  Final   Special Requests   Final    BOTTLES DRAWN AEROBIC AND ANAEROBIC Blood Culture adequate volume   Culture   Final    NO GROWTH < 24 HOURS Performed at [facility], [place] [address] [place], [place] [address]    Report Status PENDING  Incomplete  Culture, blood (Routine X 2) w Reflex to ID Panel     Status: None (Preliminary result)   Collection Time: 04/26/22  8:23 AM   Specimen: BLOOD RIGHT HAND  Result Value Ref Range Status   Specimen Description BLOOD RIGHT HAND  Final   Special Requests   Final    BOTTLES DRAWN AEROBIC AND ANAEROBIC Blood Culture adequate volume   Culture   Final    NO GROWTH < 24 HOURS Performed at [facility],  [place] [address] [place], [place] [address]    Report Status PENDING  Incomplete      Radiology Studies: ECHOCARDIOGRAM COMPLETE  Result Date: 04/26/2022    ECHOCARDIOGRAM REPORT   Patient Name:   Sherry [name] Date of Exam: 04/26/2022 Medical Rec #:  [id number]         Height:       62.0 in Accession #:    [id number]        Weight:       123.9 lb Date of Birth:  [birth year]        BSA:          1.560 m Patient Age:    35 years          BP:           169/63 mmHg Patient Gender: F                 HR:           85 bpm. Exam Location:  Inpatient Procedure: 2D Echo, 3D Echo, Cardiac Doppler and Color Doppler Indications:    Stroke  History:        Patient  has no prior history of Echocardiogram examinations.                 Abnormal ECG, Signs/Symptoms:Chest Pain and Dyspnea; Risk                 Factors:Hypertension and Dyslipidemia.  Sonographer:    Roseanna Rainbow RDCS Referring Phys: [id number] [place]  1. There is a mid cavity gradient of 39mHg related to hyperdynamic LVF and mid cavity obliteration during systole. Left ventricular ejection fraction, by estimation, is 70 to 75%. Left ventricular ejection fraction by 3D volume is 72 %. The left ventricle has hyperdynamic function. The left ventricle has no regional wall motion abnormalities. There is severe left ventricular hypertrophy of the basal segment. Left ventricular diastolic parameters are consistent with Grade I diastolic dysfunction (impaired relaxation).  2. Right ventricular systolic function is normal. The right ventricular size is normal. There is mildly elevated pulmonary artery systolic pressure. The estimated right ventricular systolic pressure is 3Q000111QmmHg.  3. The mitral valve is normal in structure. Mild mitral valve regurgitation. No evidence of mitral stenosis.  4. The aortic valve is tricuspid. Aortic valve regurgitation is trivial. Aortic valve sclerosis/calcification is present, without any evidence of aortic stenosis.  Aortic valve area, by VTI measures 3.65 cm. Aortic valve mean gradient measures 7.0 mmHg.  Aortic valve Vmax measures 1.79 m/s.  5. The inferior vena cava is normal in size with greater than 50% respiratory variability, suggesting right atrial pressure of 3 mmHg. Conclusion(s)/Recommendation(s): No intracardiac source of embolism detected on this transthoracic study. Consider a transesophageal echocardiogram to exclude cardiac source of embolism if clinically indicated. FINDINGS  Left Ventricle: There is a mid cavity gradient of 230mg related to hyperdynamic LVF and mid cavity obliteration during systole. Left ventricular ejection fraction, by estimation, is 70 to 75%. Left ventricular ejection fraction by 3D volume is 72 %. The left ventricle has hyperdynamic function. The left ventricle has no regional wall motion abnormalities. The left ventricular internal cavity size was normal in size. There is severe left ventricular hypertrophy of the basal segment. Left ventricular diastolic parameters are consistent with Grade I diastolic dysfunction (impaired relaxation). Normal left ventricular filling pressure. Right Ventricle: The right ventricular size is normal. No increase in right ventricular wall thickness. Right ventricular systolic function is normal. There is mildly elevated pulmonary artery systolic pressure. The tricuspid regurgitant velocity is 2.92  m/s, and with an assumed right atrial pressure of 3 mmHg, the estimated right ventricular systolic pressure is 37Q000111QmHg. Left Atrium: Left atrial size was normal in size. Right Atrium: Right atrial size was normal in size. Pericardium: There is no evidence of pericardial effusion. Mitral Valve: The mitral valve is normal in structure. Mild mitral valve regurgitation. No evidence of mitral valve stenosis. MV peak gradient, 7.6 mmHg. The mean mitral valve gradient is 3.0 mmHg. Tricuspid Valve: The tricuspid valve is normal in structure. Tricuspid valve  regurgitation is mild . No evidence of tricuspid stenosis. Aortic Valve: The aortic valve is tricuspid. Aortic valve regurgitation is trivial. Aortic valve sclerosis/calcification is present, without any evidence of aortic stenosis. Aortic valve mean gradient measures 7.0 mmHg. Aortic valve peak gradient measures 12.8 mmHg. Aortic valve area, by VTI measures 3.65 cm. Pulmonic Valve: The pulmonic valve was normal in structure. Pulmonic valve regurgitation is not visualized. No evidence of pulmonic stenosis. Aorta: The aortic root is normal in size and structure. Venous: The inferior vena cava is normal in size with greater  than 50% respiratory variability, suggesting right atrial pressure of 3 mmHg. IAS/Shunts: The interatrial septum appears to be lipomatous. No atrial level shunt detected by color flow Doppler.  LEFT VENTRICLE PLAX 2D LVIDd:         2.90 cm         Diastology LVIDs:         1.70 cm         LV e' medial:    5.11 cm/s LV PW:         1.10 cm         LV E/e' medial:  17.2 LV IVS:        1.60 cm         LV e' lateral:   9.36 cm/s LVOT diam:     2.30 cm         LV E/e' lateral: 9.4 LV SV:         106 LV SV Index:   68 LVOT Area:     4.15 cm        3D Volume EF                                LV 3D EF:    Left                                             ventricul                                             ar                                             ejection                                             fraction                                             by 3D                                             volume is                                             72 %.                                 3D Volume EF:  3D EF:        72 %                                LV EDV:       87 ml                                LV ESV:       24 ml                                LV SV:        62 ml RIGHT VENTRICLE             IVC RV S prime:     11.50 cm/s  IVC diam: 1.50 cm TAPSE (M-mode): 1.5  cm LEFT ATRIUM           Index        RIGHT ATRIUM          Index LA diam:      2.60 cm 1.67 cm/m   RA Area:     7.08 cm LA Vol (A2C): 41.7 ml 26.74 ml/m  RA Volume:   11.30 ml 7.25 ml/m LA Vol (A4C): 13.4 ml 8.59 ml/m  AORTIC VALVE AV Area (Vmax):    3.41 cm AV Area (Vmean):   3.39 cm AV Area (VTI):     3.65 cm AV Vmax:           179.00 cm/s AV Vmean:          117.000 cm/s AV VTI:            0.289 m AV Peak Grad:      12.8 mmHg AV Mean Grad:      7.0 mmHg LVOT Vmax:         147.00 cm/s LVOT Vmean:        95.600 cm/s LVOT VTI:          0.254 m LVOT/AV VTI ratio: 0.88  AORTA Ao Root diam: 2.90 cm Ao Asc diam:  3.00 cm MITRAL VALVE                TRICUSPID VALVE MV Area (PHT): 2.48 cm     TR Peak grad:   34.1 mmHg MV Area VTI:   3.05 cm     TR Vmax:        292.00 cm/s MV Peak grad:  7.6 mmHg MV Mean grad:  3.0 mmHg     SHUNTS MV Vmax:       1.38 m/s     Systemic VTI:  0.25 m MV Vmean:      82.1 cm/s    Systemic Diam: 2.30 cm MV Decel Time: 307 msec MV E velocity: 88.10 cm/s MV A velocity: 125.50 cm/s MV E/A ratio:  0.70 Fransico Him MD Electronically signed by Fransico Him MD Signature Date/Time: 04/26/2022/12:12:26 PM    Final       LOS: 3 days    Cordelia Poche, MD [facility] 04/28/2022, 7:47 AM   If 7PM-7AM, please contact night-coverage [email or web address]

## 2022-04-28 NOTE — Progress Notes (Signed)
Occupational Therapy Treatment Patient Details Name: Sherry [name] MRN: [id number] DOB: 1930-12-29 Today's Date: 04/28/2022   History of present illness Pt is an 89 yo female presenting 2/25 with nausea and headache, CT angiogram showed occlusion of the left vertebral artery and MRI showed left cerebellar stroke with a possible small area of restricted diffusion in the right occipital lobe. PMH includes: uncontrolled HTN, and recent fall with concussion.   OT comments  Patient supine in bed reporting 9/10 dizziness.  She requires encouragement to participate, but agreeable.  Educated and worked on gaze stabilization, good attention and focus until fatigued and voiced "I just can't do anymore".  Able to complete bed mobility with mod assist +2, sitting EOB with at best min guard but with L lateral and posterior lean and up to mod assist, and standing at EOB with mod assist +2 safety.  Educated on gaze stabilization exercises to complete today, placed 3 "A's" around her room.  Remains limited by dizziness, but decreased to 8/10 after completion of session. Will follow acutely.    Recommendations for follow up therapy are one component of a multi-disciplinary discharge planning process, led by the attending physician.  Recommendations may be updated based on patient status, additional functional criteria and insurance authorization.    Follow Up Recommendations  Acute inpatient rehab (3hours/day)     Assistance Recommended at Discharge Frequent or constant Supervision/Assistance  Patient can return home with the following  Two people to help with walking and/or transfers;A lot of help with bathing/dressing/bathroom;Assistance with cooking/housework;Direct supervision/assist for medications management;Direct supervision/assist for financial management;Assist for transportation;Help with stairs or ramp for entrance   Equipment Recommendations  Other (comment) (defer)    Recommendations for  Other Services Rehab consult    Precautions / Restrictions Precautions Precautions: Fall Precaution Comments: dizziness Restrictions Weight Bearing Restrictions: No       Mobility Bed Mobility Overal bed mobility: Needs Assistance Bed Mobility: Rolling, Sidelying to Sit, Sit to Supine Rolling: Mod assist, +2 for physical assistance, +2 for safety/equipment Sidelying to sit: Mod assist, +2 for safety/equipment   Sit to supine: Mod assist, +2 for physical assistance, +2 for safety/equipment   General bed mobility comments: pt able to initate movement of LEs towards EOB, mostly assist to ascend trunk and scoot forward.  Limited by dizziness, max cueing for gaze stabilization.    Transfers Overall transfer level: Needs assistance   Transfers: Sit to/from Stand Sit to Stand: Mod assist, +2 safety/equipment           General transfer comment: face to face with PT, mod assist to power up and steady with +2 present for safety     Balance Overall balance assessment: Needs assistance Sitting-balance support: No upper extremity supported, Feet supported Sitting balance-Leahy Scale: Poor Sitting balance - Comments: L lateral and posterior lean at EOB, suppported sitting with R lateral lean in bed. Postural control: Posterior lean, Left lateral lean Standing balance support: Bilateral upper extremity supported, During functional activity Standing balance-Leahy Scale: Poor Standing balance comment: relies on external support                           ADL either performed or assessed with clinical judgement   ADL Overall ADL's : Needs assistance/impaired     Grooming: Set up;Bed level                   Toilet Transfer: Moderate assistance;+2 for safety/equipment Toilet Transfer Details (  indicate cue type and reason): simulated side stepping to [facility]         Functional mobility during ADLs: Moderate assistance;+2 for safety/equipment General ADL Comments:  remains limited by dizziness    Extremity/Trunk Assessment              Vision       Perception     Praxis      Cognition Arousal/Alertness: Awake/alert Behavior During Therapy: Flat affect Overall Cognitive Status: Impaired/Different from baseline Area of Impairment: Problem solving, Awareness, Attention, Following commands                   Current Attention Level: Sustained   Following Commands: Follows one step commands consistently, Follows one step commands with increased time, Follows multi-step commands inconsistently   Awareness: Emergent Problem Solving: Slow processing, Decreased initiation, Difficulty sequencing, Requires verbal cues General Comments: pt with slow processing and decreased initation.  Initally demonstrating good sustained attention for gaze stabilization but fades towards end of session. Good recall of "homework" to complete after therapy.        Exercises      Shoulder Instructions       General Comments educated on gaze stabilization techniques and use of alcohol swab for reduction in dizziness.  Patient reports dizziness decreased from 9/10 to 8/10.    Pertinent Vitals/ Pain       Pain Assessment Pain Assessment: Faces Faces Pain Scale: Hurts even more Pain Location: headache Pain Descriptors / Indicators: Discomfort, Headache Pain Intervention(s): Monitored during session, Limited activity within patient's tolerance, Repositioned  Home Living                                          Prior Functioning/Environment              Frequency  Min 2X/week        Progress Toward Goals  OT Goals(current goals can now be found in the care plan section)  Progress towards OT goals: Progressing toward goals  Acute Rehab OT Goals Patient Stated Goal: less dizziness OT Goal Formulation: With patient Time For Goal Achievement: 05/10/22 Potential to Achieve Goals: Good  Plan Discharge plan remains  appropriate;Frequency remains appropriate    Co-evaluation    PT/OT/SLP Co-Evaluation/Treatment: Yes Reason for Co-Treatment: For patient/therapist safety;To address functional/ADL transfers   OT goals addressed during session: ADL's and self-care      AM-PAC OT "6 Clicks" Daily Activity     Outcome Measure   Help from another person eating meals?: A Little Help from another person taking care of personal grooming?: A Little Help from another person toileting, which includes using toliet, bedpan, or urinal?: Total Help from another person bathing (including washing, rinsing, drying)?: A Lot Help from another person to put on and taking off regular upper body clothing?: A Little Help from another person to put on and taking off regular lower body clothing?: Total 6 Click Score: 13    End of Session    OT Visit Diagnosis: Other abnormalities of gait and mobility (R26.89);Muscle weakness (generalized) (M62.81);Dizziness and giddiness (R42);Other symptoms and signs involving the nervous system (R29.898)   Activity Tolerance Patient tolerated treatment well   Patient Left in bed;with call bell/phone within reach;with bed alarm set   Nurse Communication Mobility status        Time: [id number] OT Time Calculation (min): 23 min  Charges: OT General Charges $OT Visit: 1 Visit OT Treatments $Self Care/Home Management : 8-22 mins  [place] Office [phone]   Delight Stare 04/28/2022, 11:08 AM

## 2022-04-28 NOTE — Progress Notes (Signed)
Inpatient Rehab Admissions Coordinator:  ? ?Per therapy recommendations,  patient was screened for CIR candidacy by Delphia Kaylor, MS, CCC-SLP. At this time, Pt. Appears to be a a potential candidate for CIR. I will place   order for rehab consult per protocol for full assessment. Please contact me any with questions. ? ?Tamella Tuccillo, MS, CCC-SLP ?Rehab Admissions Coordinator  ?[phone] (celll) ?[phone] (office) ? ?

## 2022-04-28 NOTE — Plan of Care (Signed)
  Problem: Respiratory: Goal: Ability to maintain adequate ventilation will improve Outcome: Progressing   Problem: Ischemic Stroke/TIA Tissue Perfusion: Goal: Complications of ischemic stroke/TIA will be minimized Outcome: Progressing   Problem: Coping: Goal: Will verbalize positive feelings about self Outcome: Progressing   Problem: Nutrition: Goal: Dietary intake will improve Outcome: Progressing   Problem: Activity: Goal: Risk for activity intolerance will decrease Outcome: Progressing   Problem: Nutrition: Goal: Adequate nutrition will be maintained Outcome: Progressing   Problem: Pain Managment: Goal: General experience of comfort will improve Outcome: Progressing   Problem: Safety: Goal: Ability to remain free from injury will improve Outcome: Progressing

## 2022-04-28 NOTE — Progress Notes (Signed)
STROKE TEAM PROGRESS NOTE   INTERVAL HISTORY No family is at the bedside.  No new imaging.  Transferred from ICU 2/26.  Patient continues to c/o dizziness with any eye movements, along with headache/nausea. PRN medications added. Encourage PT/OT and OOB for meals, fall precautions.  Discharge planning for AIR.      Vitals:   04/28/22 0554 04/28/22 0743 04/28/22 1240 04/28/22 1306  BP: (!) 196/79 (!) 172/68 (!) 155/61   Pulse: 84 85 81   Resp: '18 18 16   '$ Temp: 97.9 F (36.6 C) 98 F (36.7 C) 98.2 F (36.8 C)   TempSrc: Oral Oral Oral   SpO2: 97% 99% 97%   Weight:    56.7 kg   CBC:  Recent Labs  Lab 04/25/22 0948 04/26/22 0320  WBC 17.8* 17.5*  NEUTROABS 7.5  --   HGB 14.6 14.0  HCT 43.9 40.8  MCV 88.3 87.6  PLT 418* [id number]   Basic Metabolic Panel:  Recent Labs  Lab 04/25/22 0948 04/26/22 0320  NA 135 133*  K 3.4* 3.5  CL 100 97*  CO2 23 22  GLUCOSE 141* 131*  BUN 30* 19  CREATININE 0.65 0.64  CALCIUM 9.4 8.9  MG  --  1.4*  PHOS  --  3.0   Lipid Panel:  Recent Labs  Lab 04/26/22 0818  CHOL 233*  TRIG 292*  HDL 53  CHOLHDL 4.4  VLDL 58*  LDLCALC 122*    IMAGING past 24 hours No results found.  PHYSICAL EXAM  Temp:  [97.6 F (36.4 C)-98.8 F (37.1 C)] 98.2 F (36.8 C) (02/28 1240) Pulse Rate:  [78-87] 81 (02/28 1240) Resp:  [14-18] 16 (02/28 1240) BP: (155-220)/(61-83) 155/61 (02/28 1240) SpO2:  [96 %-99 %] 97 % (02/28 1240) Weight:  [56.7 kg] 56.7 kg (02/28 1306)  General - Well nourished, well developed, in no apparent distress. Cardiovascular - Sinus tachycardia on monitor.   Mental Status -  Level of arousal and orientation to time, place, and person were intact. Language including expression, naming, repetition, comprehension was assessed and found intact.  Cranial Nerves II - XII - II - Visual field intact OU. III, IV, VI - Extraocular movements intact. C/o continued dizziness with any EOM.  Few beats of end gaze nystagmus on  left gaze V - Facial sensation intact bilaterally. VII - Facial movement intact bilaterally. VIII - Hearing & vestibular intact bilaterally. X - Palate elevates symmetrically. XI - Chin turning & shoulder shrug intact bilaterally. XII - Tongue protrusion intact.  Motor - Generalized weakness in all extremities, pronator drift was absent.  Bulk and tone normal and fasciculations were absent.    Sensory - Light touch assessed and symmetrical.    Coordination -mild left finger-to-nose ataxia.  Left knee to heel coordination slow.  Normal coordination on the right Gait and Station - deferred.   ASSESSMENT/PLAN Sherry [name] is a 89 y.o. female with history of uncontrolled hypertension, breast cancer, syncope, chronic neck pain presenting with presented to the emergency room for sudden onset of headache and just a general sensation of not feeling well upon awakening . CT angiogram revealed occlusion of the left vertebral artery, V2 level onwards with reconstitution proximal to the vertebrobasilar junction. MRI brain revealed left cerebellar stroke with a possible small area of restricted diffusion in the right occipital lobe. IR procedure cancelled due to benefit vs risk stratification.   Acute Left posterior inferior cerebellar Infarct  Etiology:  occluded left vertebral artery secondary  to atherosclerosis and uncontrolled hypertension Code Stroke CT head: No acute findings. No intracranial mass, hemorrhage or edema. Chronic small vessel ischemic changes in the white matter. CTA head & neck: Occluded Left Vertebral Artery in the V2 segment, appears new since November and with reconstitution only at the vertebrobasilar junction. This could be secondary to vertebral artery dissection or atherosclerosis occlusion (see #2). Little atherosclerosis in the neck, but evidence of Advanced Intracranial Atherosclerosis with widespread Moderate And Severe 2nd and 3rd COW branch stenoses. No  discrete branch occlusion MRI    Acute infarct in the medial aspect of the left cerebellar hemisphere. Additional region of increased signal on diffusion-weighted imaging in the medial aspect of the right occipital lobe, favored to represent an additional site of an acute to subacute infarct. Absent flow void in the V4 segment of the left vertebral artery, in keeping with known occlusion seen on same day CTA head/neck MRI November 2023:  No acute intracranial infarct or other abnormality. Stable brain MRI with chronic microvascular ischemic disease and chronic right frontal encephalomalacia. 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 No hemorrhagic transformation or posterior fossa mass effect at this time. No other acute intracranial abnormality by CT. Chronic supratentorial small and medium-sized vessel ischemia  2D Echo: LVEF 70-75%, Severe LVH, Grade I diastolic dysfunction, Mildly elevated PASP, Mild MVR, Trivial AVR  LDL 122 HgbA1c: 5.6 VTE prophylaxis - lovenox    Diet   Diet regular Room service appropriate? Yes; Fluid consistency: Thin   No antithrombotic prior to admission, now on aspirin 81 mg daily and clopidogrel 75 mg daily.  for 3 months, followed by aspirin alone.  Therapy recommendations:  AIR Disposition:  pending  Hypertension Home meds:  Toprol-XL '25mg'$ , Norvasc '10mg'$ , Hydralazine '50mg'$ , HCTZ '25mg'$ . Restarted.  UnStable. Per chart review, Patient's spouse states she is not taking her anti-hypertensive medications Permissive hypertension (OK if < 220/120) but gradually normalize in 5-7 days Long-term BP goal normotensive  Hyperlipidemia Home meds:  none LDL 122, goal < 70 Add Lipitor '20mg'$  due to known vascular disease and LD not in goal  Continue statin at discharge   Other Stroke Risk Factors Advanced Age >/= 49   Other Active Problems Nausea Zofran, Reglan added PRN Hx of  syncope Previous Holter Monitor monitoring 12/2021 for syncope: NSR with episodes of tachycardia up to 120. Toprol-XL '25mg'$  daily added after this. Hx of chronic neck pain Hx of Breast Cancer  Hospital day # 3    Patient yet continues to have moderate headache as well as nausea and dizziness.  Recommend Tylenol with codeine for headache and she is refusing Phenergan or Reglan  or Zofran since it is not helping.  Mobilize out of bed.  Therapy consults.  She will likely need inpatient rehab.  No family available at the bedside.  Greater than 50% time during this 35-minute visit was spent in counseling and coordination of care about cerebellar stroke and nausea and headache and answering questions discussion with care team.  Discussed with Dr. Lonny Prude.  No family available at the bedside Antony Contras, MD Medical Director [facility] Pager: [phone] 04/28/2022 1:43 PM   To contact Stroke Continuity provider, please refer to [email or web address] After hours, contact General Neurology

## 2022-04-28 NOTE — Progress Notes (Signed)
Physical Therapy Treatment Patient Details Name: Sherry [name] MRN: [id number] DOB: 06-Oct-1930 Today's Date: 04/28/2022   History of Present Illness Pt is an 89 yo female presenting 2/25 with nausea and headache, CT angiogram showed occlusion of the left vertebral artery and MRI showed left cerebellar stroke with a possible small area of restricted diffusion in the right occipital lobe. PMH includes: uncontrolled HTN, and recent fall with concussion.    PT Comments    Treated pt in conjunction with OT due to pt's dizziness and nausea continuing to persist and limiting her activity tolerance. She was able to tolerate increased mobility and progress to OOB mobility today while focusing on gaze fixation throughout the session, indicating success. Utilized an alcohol swap to reduce nausea throughout as well. She reported her dizziness was 9/10 start of session and 8/10 by the end of the session, further supporting gaze fixation to assist her in managing her dizziness. Pt required less assistance of modAx2 for bed mobility and modA to transfer to stand and take a few side steps along EOB with UE support. Current recommendations remain appropriate. Will continue to follow acutely.     Recommendations for follow up therapy are one component of a multi-disciplinary discharge planning process, led by the attending physician.  Recommendations may be updated based on patient status, additional functional criteria and insurance authorization.  Follow Up Recommendations  Acute inpatient rehab (3hours/day)     Assistance Recommended at Discharge Frequent or constant Supervision/Assistance  Patient can return home with the following Two people to help with walking and/or transfers;Two people to help with bathing/dressing/bathroom;Assistance with cooking/housework;Direct supervision/assist for medications management;Assistance with feeding;Direct supervision/assist for financial management;Assist for  transportation;Help with stairs or ramp for entrance   Equipment Recommendations  Other (comment) (defer to post acute)    Recommendations for Other Services       Precautions / Restrictions Precautions Precautions: Fall Precaution Comments: dizziness Restrictions Weight Bearing Restrictions: No     Mobility  Bed Mobility Overal bed mobility: Needs Assistance Bed Mobility: Rolling, Sidelying to Sit, Sit to Supine Rolling: Mod assist, +2 for physical assistance, +2 for safety/equipment Sidelying to sit: Mod assist, +2 for safety/equipment, +2 for physical assistance   Sit to supine: Mod assist, +2 for physical assistance, +2 for safety/equipment   General bed mobility comments: pt able to initate movement of LEs towards EOB, mostly assist to ascend trunk and scoot forward.  Limited by dizziness, max cueing for gaze stabilization throughout all mobility with x3 "A" written on paper and placed anterior to bed and L and R to bed.    Transfers Overall transfer level: Needs assistance Equipment used: 1 person hand held assist Transfers: Sit to/from Stand Sit to Stand: Mod assist, +2 safety/equipment           General transfer comment: face to face with PT, mod assist to power up and steady with +2 present for safety, cuing pt to keeps gaze stabilized on "A" written on paper anterior to her    Ambulation/Gait Ambulation/Gait assistance: Mod assist, +2 safety/equipment Gait Distance (Feet): 2 Feet Assistive device: 1 person hand held assist Gait Pattern/deviations: Step-to pattern, Decreased step length - left, Decreased stride length, Decreased weight shift to right, Decreased weight shift to left, Trunk flexed Gait velocity: reduced Gait velocity interpretation: <1.31 ft/sec, indicative of household ambulator   General Gait Details: Pt with small, slow lateral steps to L along EOB with UE support on therapist anterior to her. Pt needing tactile cues  to shift weight  anteriorly and advance L foot to step. Cues provided to keep gaze fixated on "A" written on paper anterior to her throughout   Stairs             Wheelchair Mobility    Modified Rankin (Stroke Patients Only) Modified Rankin (Stroke Patients Only) Pre-Morbid Rankin Score: No symptoms Modified Rankin: Moderately severe disability     Balance Overall balance assessment: Needs assistance Sitting-balance support: No upper extremity supported, Feet supported Sitting balance-Leahy Scale: Poor Sitting balance - Comments: L lateral and posterior lean at EOB ranging from minguard-modA, suppported sitting with R lateral lean in bed. Postural control: Posterior lean, Left lateral lean Standing balance support: Bilateral upper extremity supported, During functional activity Standing balance-Leahy Scale: Poor Standing balance comment: relies on external support                            Cognition Arousal/Alertness: Awake/alert Behavior During Therapy: Flat affect Overall Cognitive Status: Impaired/Different from baseline Area of Impairment: Problem solving, Awareness, Attention, Following commands                   Current Attention Level: Sustained   Following Commands: Follows one step commands consistently, Follows one step commands with increased time, Follows multi-step commands inconsistently   Awareness: Emergent Problem Solving: Slow processing, Decreased initiation, Difficulty sequencing, Requires verbal cues General Comments: pt with slow processing and decreased initation.  Initally demonstrating good sustained attention for gaze stabilization but fades towards end of session. Good recall of "homework" to complete after therapy.        Exercises      General Comments General comments (skin integrity, edema, etc.): educated on gaze stabilization techniques/exercises and use of alcohol swab for reduction in dizziness & nausea. Patient reports  dizziness decreased from 9/10 start of session to 8/10 end of session.      Pertinent Vitals/Pain Pain Assessment Pain Assessment: Faces Faces Pain Scale: Hurts even more Pain Location: headache Pain Descriptors / Indicators: Discomfort, Headache Pain Intervention(s): Limited activity within patient's tolerance, Monitored during session, Repositioned    Home Living                          Prior Function            PT Goals (current goals can now be found in the care plan section) Acute Rehab PT Goals Patient Stated Goal: to not be dizzy PT Goal Formulation: With patient Time For Goal Achievement: 05/10/22 Potential to Achieve Goals: Good Progress towards PT goals: Progressing toward goals    Frequency    Min 4X/week      PT Plan Current plan remains appropriate    Co-evaluation PT/OT/SLP Co-Evaluation/Treatment: Yes Reason for Co-Treatment: For patient/therapist safety;To address functional/ADL transfers PT goals addressed during session: Mobility/safety with mobility;Balance OT goals addressed during session: ADL's and self-care      AM-PAC PT "6 Clicks" Mobility   Outcome Measure  Help needed turning from your back to your side while in a flat bed without using bedrails?: Total Help needed moving from lying on your back to sitting on the side of a flat bed without using bedrails?: Total Help needed moving to and from a bed to a chair (including a wheelchair)?: Total Help needed standing up from a chair using your arms (e.g., wheelchair or bedside chair)?: A Lot Help needed to walk in hospital  room?: Total Help needed climbing 3-5 steps with a railing? : Total 6 Click Score: 7    End of Session   Activity Tolerance: Other (comment) (limited by dizziness/nausea) Patient left: in bed;with call bell/phone within reach;with bed alarm set;with SCD's reapplied   PT Visit Diagnosis: Other abnormalities of gait and mobility (R26.89);Muscle weakness  (generalized) (M62.81);History of falling (Z91.81);Difficulty in walking, not elsewhere classified (R26.2);Unsteadiness on feet (R26.81);Dizziness and giddiness (R42)     Time: [id number] PT Time Calculation (min) (ACUTE ONLY): 25 min  Charges:  $Therapeutic Activity: 8-22 mins                     Moishe Spice, PT, DPT Acute Rehabilitation Services  Office: [phone]    Orvan Falconer 04/28/2022, 11:35 AM

## 2022-04-29 DIAGNOSIS — R42 Dizziness and giddiness: Secondary | ICD-10-CM

## 2022-04-29 DIAGNOSIS — I161 Hypertensive emergency: Secondary | ICD-10-CM | POA: Diagnosis not present

## 2022-04-29 DIAGNOSIS — D72829 Elevated white blood cell count, unspecified: Secondary | ICD-10-CM | POA: Diagnosis not present

## 2022-04-29 DIAGNOSIS — R112 Nausea with vomiting, unspecified: Secondary | ICD-10-CM

## 2022-04-29 DIAGNOSIS — I63112 Cerebral infarction due to embolism of left vertebral artery: Secondary | ICD-10-CM | POA: Diagnosis not present

## 2022-04-29 LAB — CBC
HCT: 32.8 % — ABNORMAL LOW (ref 36.0–46.0)
Hemoglobin: 11.2 g/dL — ABNORMAL LOW (ref 12.0–15.0)
MCH: 30.4 pg (ref 26.0–34.0)
MCHC: 34.1 g/dL (ref 30.0–36.0)
MCV: 88.9 fL (ref 80.0–100.0)
Platelets: 343 10*3/uL (ref 150–400)
RBC: 3.69 MIL/uL — ABNORMAL LOW (ref 3.87–5.11)
RDW: 13.4 % (ref 11.5–15.5)
WBC: 13.8 10*3/uL — ABNORMAL HIGH (ref 4.0–10.5)
nRBC: 0 % (ref 0.0–0.2)

## 2022-04-29 MED ORDER — MECLIZINE HCL 12.5 MG PO TABS
12.5000 mg | ORAL_TABLET | Freq: Two times a day (BID) | ORAL | Status: DC
  Administered 2022-04-29 – 2022-05-05 (×13): 12.5 mg via ORAL
  Filled 2022-04-29 (×14): qty 1

## 2022-04-29 MED ORDER — AMLODIPINE BESYLATE 10 MG PO TABS
10.0000 mg | ORAL_TABLET | Freq: Every day | ORAL | Status: DC
  Administered 2022-04-29 – 2022-05-11 (×13): 10 mg via ORAL
  Filled 2022-04-29 (×13): qty 1

## 2022-04-29 NOTE — Progress Notes (Signed)
PROGRESS NOTE    Sherry [name]  [id number] DOB: 1930/10/25 DOA: 04/25/2022 PCP: Rusty Aus, MD   Brief Narrative: Sherry [name] is a 89 y.o. female with a history of hypertension, recurrent falls, breast cancer status postlumpectomy and radiation.  Patient presented secondary to headache, nausea, vomiting with associated dizziness.  MRI imaging confirmed presence of acute cerebellar infarct and CT angiogram of the head and neck was significant for left vertebral artery occlusion.  Patient was admitted for hypertensive emergency and acute stroke, initially requiring ICU admission and Cleviprex drip.  For antihypertensives restarted and Cleviprex weaned off.  Patient with intractable vertigo/nausea secondary to acute infarct.  Physical therapy recommending acute inpatient rehab on discharge.   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 MRI confirms acute infarct of medial aspect of left cerebellar hemisphere in addition to likely acute/subacute infarct of the medial aspect of the right occipital lobe. CTA head and neck significant for an occluded left vertebral artery in the V2 segment. LDL of 122. Hemoglobin A1C of 5.6%. Transthoracic Echocardiogram significant for no atrial level shunt. Neurology recommendations for aspirin indefinitely and Plavix 75 mg for three months. PT/OT recommendations for acute inpatient rehabilitation.  Hypertensive emergency Per history, patient's outpatient antihypertensives discontinued in December. Patient managed initially on Cleviprex and able to wean off. -Increase to amlodipine 10 mg daily -Continue hydralazine and metoprolol succinate  Leukocytosis No source for infection noted. Trended down.  Lactic acidosis Present on admission. Improved with IV fluids.  Intractable nausea/vomiting Intractable vertigo Secondary to acute stroke. Therapies have not been  successful in treating symptoms. -Continue Reglan scheduled and meclizine scheduled  Right breast nodule/mass 19 mm mixed density nodule/mass noted on CT chest. Patient with a history of right breast cancer s/p lumpectomy. Patient will need follow-up for nodule with mammography.   DVT prophylaxis: Lovenox Code Status:   Code Status: Full Code Family Communication: None at bedside Disposition Plan: Discharge to acute inpatient rehabilitation if accepted.   Consultants:  PCCM Neurology  Procedures:  2/26: Transthoracic Echocardiogram  Antimicrobials: None    Subjective: Patient reported to have continued vertigo symptoms.  Objective: BP (!) 146/66   Pulse 99   Temp 97.8 F (36.6 C) (Oral)   Resp 20   Wt 57.9 kg   SpO2 100%   BMI 23.35 kg/m   Examination:  General exam: Appears calm and comfortable   Data Reviewed: I have personally reviewed following labs and imaging studies  CBC Lab Results  Component Value Date   WBC 13.8 (H) 04/29/2022   RBC 3.69 (L) 04/29/2022   HGB 11.2 (L) 04/29/2022   HCT 32.8 (L) 04/29/2022   MCV 88.9 04/29/2022   MCH 30.4 04/29/2022   PLT 343 04/29/2022   MCHC 34.1 04/29/2022   RDW 13.4 04/29/2022   LYMPHSABS 8.8 (H) 04/25/2022   MONOABS 0.9 04/25/2022   EOSABS 0.1 04/25/2022   BASOSABS 0.1 [id number]     Last metabolic panel Lab Results  Component Value Date   NA 133 (L) 04/26/2022   K 3.5 04/26/2022   CL 97 (L) 04/26/2022   CO2 22 04/26/2022   BUN 19 04/26/2022   CREATININE 0.64 04/26/2022   GLUCOSE 131 (H) 04/26/2022   GFRNONAA >60 04/26/2022   GFRAA >60 08/15/2017   CALCIUM 8.9 04/26/2022   PHOS 3.0 04/26/2022   PROT 7.0 04/25/2022   ALBUMIN 4.1 04/25/2022   BILITOT 0.9 04/25/2022   ALKPHOS  43 04/25/2022   AST 26 04/25/2022   ALT 23 04/25/2022   ANIONGAP 14 04/26/2022    GFR: Estimated Creatinine Clearance: 37.7 mL/min (by C-G formula based on SCr of 0.64 mg/dL).  Recent Results (from the past 240  hour(s))  MRSA Next Gen by PCR, Nasal     Status: None   Collection Time: 04/25/22  5:23 PM   Specimen: Nasal Mucosa; Nasal Swab  Result Value Ref Range Status   MRSA by PCR Next Gen NOT DETECTED NOT DETECTED Final    Comment: (NOTE) The GeneXpert MRSA Assay (FDA approved for NASAL specimens only), is one component of a comprehensive MRSA colonization surveillance program. It is not intended to diagnose MRSA infection nor to guide or monitor treatment for MRSA infections. Test performance is not FDA approved in patients less than 28 years old. Performed at [facility], [place] [address], [place], [place] [address]   Culture, blood (Routine X 2) w Reflex to ID Panel     Status: None (Preliminary result)   Collection Time: 04/26/22  8:18 AM   Specimen: BLOOD RIGHT HAND  Result Value Ref Range Status   Specimen Description BLOOD RIGHT HAND  Final   Special Requests   Final    BOTTLES DRAWN AEROBIC AND ANAEROBIC Blood Culture adequate volume   Culture   Final    NO GROWTH 3 DAYS Performed at [facility], [place] [address] [place], [place] [address]    Report Status PENDING  Incomplete  Culture, blood (Routine X 2) w Reflex to ID Panel     Status: None (Preliminary result)   Collection Time: 04/26/22  8:23 AM   Specimen: BLOOD RIGHT HAND  Result Value Ref Range Status   Specimen Description BLOOD RIGHT HAND  Final   Special Requests   Final    BOTTLES DRAWN AEROBIC AND ANAEROBIC Blood Culture adequate volume   Culture   Final    NO GROWTH 3 DAYS Performed at [facility], [place] [address] [place], [place] [address]    Report Status PENDING  Incomplete      Radiology Studies: No results found.    LOS: 4 days    Cordelia Poche, MD [facility] 04/29/2022, 1:12 PM   If 7PM-7AM, please contact night-coverage [email or web address]

## 2022-04-29 NOTE — Progress Notes (Signed)
STROKE TEAM PROGRESS NOTE   INTERVAL HISTORY His son is at the bedside.  No new imaging.  Transferred from ICU 2/26.  Patient continues to c/o dizziness with any eye movements, along with headache/nausea.  She states medication make it worse encourage PT/OT and OOB for meals, fall precautions.  Discharge planning for AIR.  Vital signs stable.  Neurological exam unchanged continues to have mild left finger-to-nose dysmetria   Vitals:   04/29/22 0419 04/29/22 0423 04/29/22 0843 04/29/22 1221  BP:  (!) 193/76 (!) 188/75 (!) 146/66  Pulse:  76 80 99  Resp:  (!) 22 20   Temp:  97.7 F (36.5 C) 97.7 F (36.5 C) 97.8 F (36.6 C)  TempSrc:  Oral Oral Oral  SpO2:  97% 96% 100%  Weight: 57.9 kg      CBC:  Recent Labs  Lab 04/25/22 0948 04/26/22 0320 04/29/22 0446  WBC 17.8* 17.5* 13.8*  NEUTROABS 7.5  --   --   HGB 14.6 14.0 11.2*  HCT 43.9 40.8 32.8*  MCV 88.3 87.6 88.9  PLT 418* 373 [id number]   Basic Metabolic Panel:  Recent Labs  Lab 04/25/22 0948 04/26/22 0320  NA 135 133*  K 3.4* 3.5  CL 100 97*  CO2 23 22  GLUCOSE 141* 131*  BUN 30* 19  CREATININE 0.65 0.64  CALCIUM 9.4 8.9  MG  --  1.4*  PHOS  --  3.0   Lipid Panel:  Recent Labs  Lab 04/26/22 0818  CHOL 233*  TRIG 292*  HDL 53  CHOLHDL 4.4  VLDL 58*  LDLCALC 122*    IMAGING past 24 hours No results found.  PHYSICAL EXAM  Temp:  [97.7 F (36.5 C)-98.2 F (36.8 C)] 97.8 F (36.6 C) (02/29 1221) Pulse Rate:  [70-99] 99 (02/29 1221) Resp:  [16-22] 20 (02/29 0843) BP: (146-199)/(61-76) 146/66 (02/29 1221) SpO2:  [93 %-100 %] 100 % (02/29 1221) Weight:  [56.7 kg-57.9 kg] 57.9 kg (02/29 0419)  General - Well nourished, well developed, in no apparent distress. Cardiovascular - Sinus tachycardia on monitor.   Mental Status -  Level of arousal and orientation to time, place, and person were intact. Language including expression, naming, repetition, comprehension was assessed and found  intact.  Cranial Nerves II - XII - II - Visual field intact OU. III, IV, VI - Extraocular movements intact. C/o continued dizziness with any EOM.  Few beats of end gaze nystagmus on left gaze V - Facial sensation intact bilaterally. VII - Facial movement intact bilaterally. VIII - Hearing & vestibular intact bilaterally. X - Palate elevates symmetrically. XI - Chin turning & shoulder shrug intact bilaterally. XII - Tongue protrusion intact.  Motor - Generalized weakness in all extremities, pronator drift was absent.  Bulk and tone normal and fasciculations were absent.    Sensory - Light touch assessed and symmetrical.    Coordination -mild left finger-to-nose ataxia.  Left knee to heel coordination slow.  Normal coordination on the right Gait and Station - deferred.   ASSESSMENT/PLAN Sherry [name] is a 89 y.o. female with history of uncontrolled hypertension, breast cancer, syncope, chronic neck pain presenting with presented to the emergency room for sudden onset of headache and just a general sensation of not feeling well upon awakening . CT angiogram revealed occlusion of the left vertebral artery, V2 level onwards with reconstitution proximal to the vertebrobasilar junction. MRI brain revealed left cerebellar stroke with a possible small area of restricted diffusion in the  right occipital lobe. IR procedure cancelled due to benefit vs risk stratification.   Acute Left posterior inferior cerebellar Infarct  Etiology:  occluded left vertebral artery secondary to atherosclerosis and uncontrolled hypertension Code Stroke CT head: No acute findings. No intracranial mass, hemorrhage or edema. Chronic small vessel ischemic changes in the white matter. CTA head & neck: Occluded Left Vertebral Artery in the V2 segment, appears new since November and with reconstitution only at the vertebrobasilar junction. This could be secondary to vertebral artery dissection or atherosclerosis  occlusion (see #2). Little atherosclerosis in the neck, but evidence of Advanced Intracranial Atherosclerosis with widespread Moderate And Severe 2nd and 3rd COW branch stenoses. No discrete branch occlusion MRI    Acute infarct in the medial aspect of the left cerebellar hemisphere. Additional region of increased signal on diffusion-weighted imaging in the medial aspect of the right occipital lobe, favored to represent an additional site of an acute to subacute infarct. Absent flow void in the V4 segment of the left vertebral artery, in keeping with known occlusion seen on same day CTA head/neck MRI November 2023:  No acute intracranial infarct or other abnormality. Stable brain MRI with chronic microvascular ischemic disease and chronic right frontal encephalomalacia. 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 No hemorrhagic transformation or posterior fossa mass effect at this time. No other acute intracranial abnormality by CT. Chronic supratentorial small and medium-sized vessel ischemia  2D Echo: LVEF 70-75%, Severe LVH, Grade I diastolic dysfunction, Mildly elevated PASP, Mild MVR, Trivial AVR  LDL 122 HgbA1c: 5.6 VTE prophylaxis - lovenox    Diet   Diet regular Room service appropriate? Yes; Fluid consistency: Thin   No antithrombotic prior to admission, now on aspirin 81 mg daily and clopidogrel 75 mg daily.  for 3 months, followed by aspirin alone.  Therapy recommendations:  AIR Disposition:  pending  Hypertension Home meds:  Toprol-XL 38m, Norvasc 163m Hydralazine 5050mHCTZ 74m28mestarted.  UnStable. Per chart review, Patient's spouse states she is not taking her anti-hypertensive medications Permissive hypertension (OK if < 220/120) but gradually normalize in 5-7 days Long-term BP goal normotensive  Hyperlipidemia Home meds:  none LDL 122, goal < 70 Add Lipitor 20mg82m to known vascular  disease and LD not in goal  Continue statin at discharge   Other Stroke Risk Factors Advanced Age >/= 65   36her Active Problems Nausea Zofran, Reglan added PRN Hx of syncope Previous Holter Monitor monitoring 12/2021 for syncope: NSR with episodes of tachycardia up to 120. Toprol-XL 74mg 22my added after this. Hx of chronic neck pain Hx of Breast Cancer  Hospital day # 4   Patient yet continues to have moderate headache as well as nausea and dizziness.  Recommend trial of meclizine 12.5 mg 3 times daily for dizziness.  Mobilize out of bed.  Therapy consults.  She will likely need inpatient rehab.  No family available at the bedside.  Long discussion with son at the bedside and answered questions about her management and need for rehab and answering questions greater than 50% time during this 35-minute visit was spent in counseling and coordination of care about cerebellar stroke and nausea and headache and answering questions discussion with care team.  Discussed with Dr. NetteyLonny Prudefamily available at the bedside PramodAntony Contrasedical Director Moses Hickory: [phone] 12:36 PM   To contact Stroke Continuity provider, please refer to [email or web address]  hours, contact General Neurology

## 2022-04-29 NOTE — Progress Notes (Addendum)
Inpatient Rehabilitation Admissions Coordinator   I met at bedside with patient . States still very nauseous. I will follow up with patient and family for rehab assessment. I left spouse a voicemail to contact me to discuss her rehab needs.  Danne Baxter, RN, MSN Rehab Admissions Coordinator [phone] 04/29/2022 11:11 AM

## 2022-04-29 NOTE — Progress Notes (Signed)
Inpatient Rehabilitation Admissions Coordinator   I met with patient's son to discuss rehab options. He states he has been looking into [facility] or IL[facility] for his Mom. Patient currently is not at a level to pursue CIR admit. I will follow her progress for a few therapy sessions, but if tolerance/Nausea persists, she likely would need SNF. He would prefer SNF at [facility] if SNF needed. I will alert TOC RN CM and SW.  Danne Baxter, RN, MSN Rehab Admissions Coordinator [phone] 04/29/2022 3:43 PM

## 2022-04-29 NOTE — Progress Notes (Signed)
Inpatient Rehabilitation Admissions Coordinator   I received call from son who requests to be primary contact. We are to meet at 3 pm today to discuss rehab venue options.  Danne Baxter, RN, MSN Rehab Admissions Coordinator [phone] 04/29/2022 12:19 PM

## 2022-04-29 NOTE — Progress Notes (Signed)
Physical Therapy Treatment Patient Details Name: Sherry [name] MRN: [id number] DOB: Feb 26, 1931 Today's Date: 04/29/2022   History of Present Illness Pt is an 89 yo female presenting 2/25 with nausea and headache, CT angiogram showed occlusion of the left vertebral artery and MRI showed left cerebellar stroke with a possible small area of restricted diffusion in the right occipital lobe. PMH includes: uncontrolled HTN, and recent fall with concussion.    PT Comments    Pt continues to remain limited in mobility by her persistent dizziness and nausea. She reported her dizziness to be 10/10 today, with her nausea improving with smelling alcohol swabs throughout the session. Pt rolled to the L with maxA before getting very nauseated and beginning to cry to be returned to supine due to her symptom severity. Pt declining any further attempts at mobility, but after extensive education on her risk of muscle atrophy and importance of trying to perform gaze fixation exercises to try to reduce her dizziness she was agreeable to bed level exercises. Focused remainder of session on performing gaze fixation exercises supine in bed, alternating between eyes moving with head static, head moving with eyes static, and both eyes and head moving together between objects. Pt having to move very slowly to tolerate the exercises. Pt only able to tolerate x5 SLR of her lower extremities before declining any further exercises due to her symptoms. Hopefully, pt's symptoms will improve to allow her to tolerate increased activity, but if not then may need to update recs to a SNF instead.     Recommendations for follow up therapy are one component of a multi-disciplinary discharge planning process, led by the attending physician.  Recommendations may be updated based on patient status, additional functional criteria and insurance authorization.  Follow Up Recommendations  Acute inpatient rehab (3hours/day)     Assistance  Recommended at Discharge Frequent or constant Supervision/Assistance  Patient can return home with the following Two people to help with walking and/or transfers;Two people to help with bathing/dressing/bathroom;Assistance with cooking/housework;Direct supervision/assist for medications management;Assistance with feeding;Direct supervision/assist for financial management;Assist for transportation;Help with stairs or ramp for entrance   Equipment Recommendations  Other (comment) (defer to post acute)    Recommendations for Other Services       Precautions / Restrictions Precautions Precautions: Fall Precaution Comments: dizziness Restrictions Weight Bearing Restrictions: No     Mobility  Bed Mobility Overal bed mobility: Needs Assistance Bed Mobility: Rolling Rolling: Max assist         General bed mobility comments: Pt cued to find nurse button on top bed rail to her L to fixate her gaze while rolling to the L. Poor initiation by pt with her even leaving her R arm behind, needing maxA to roll. Pt beginning to cry once L sidelying requesting to not progress mobility further due to her nausea/dizziness, thus returned pt to supine for bed level exercises.    Transfers                   General transfer comment: Pt declined    Ambulation/Gait               General Gait Details: Pt declined   Stairs             Wheelchair Mobility    Modified Rankin (Stroke Patients Only) Modified Rankin (Stroke Patients Only) Pre-Morbid Rankin Score: No symptoms Modified Rankin: Moderately severe disability     Balance  Cognition Arousal/Alertness: Awake/alert Behavior During Therapy: Flat affect Overall Cognitive Status: Impaired/Different from baseline Area of Impairment: Problem solving, Awareness, Attention, Following commands                   Current Attention Level: Sustained    Following Commands: Follows one step commands consistently, Follows one step commands with increased time, Follows multi-step commands inconsistently   Awareness: Emergent Problem Solving: Slow processing, Decreased initiation, Difficulty sequencing, Requires verbal cues, Requires tactile cues General Comments: Pt limited by nausea and dizziness, declining any bed mobility or attempts to get OOB past rolling to the L 1x. Needs encouragement to participate in bed level exercises, educating pt that the dizziness is an unfortunate symptom of her stroke's location and it may be a while before it improves if it does and she needs to try to mobilize and remain strong as able. Pt then agreeable to try exercises. Slow to process cues and poor initiation by pt        Exercises General Exercises - Lower Extremity Straight Leg Raises: AROM, Strengthening, Both, 5 reps, Supine (declined any further leg exercises due to nausea/dizziness) Other Exercises Other Exercises: Gaze fixation exercises with pt supine, cuing pt to only move as fast as she can tolerate without worsening her symptoms - eyes static on "A" with head rotating L <> R and then head nodding up <> down for >x10 reps with "A" anterior to her and 5-10x with "A" to L and then again to R side, head static with eyes alternating between "A" to her L and to the "A" anterior to her and then again between "A" to her R and "A" anterior to her 5-10x each, eyes and head alternating between "A" to her L and to the "A" anterior to her and then again between "A" to her R and "A" anterior to her 5-10x each, head static with eyes following "A" anterior to her being moved up <> down and then L <> R 5x each to pt's tolerated speed    General Comments General comments (skin integrity, edema, etc.): reports 10/10 dizziness today, alcohol swabs improved nausea throughout session      Pertinent Vitals/Pain Pain Assessment Pain Assessment: Faces Faces Pain Scale:  Hurts little more Pain Location: discomfort with nausea and dizziness Pain Descriptors / Indicators: Discomfort, Grimacing Pain Intervention(s): Limited activity within patient's tolerance, Monitored during session, Repositioned, Other (comment) (alcohol swabs to smell to reduce nausea)    Home Living                          Prior Function            PT Goals (current goals can now be found in the care plan section) Acute Rehab PT Goals Patient Stated Goal: to reduce her dizziness and nausea PT Goal Formulation: With patient Time For Goal Achievement: 05/10/22 Potential to Achieve Goals: Good Progress towards PT goals: Not progressing toward goals - comment (limited by nausea/dizziness)    Frequency    Min 4X/week      PT Plan Current plan remains appropriate    Co-evaluation              AM-PAC PT "6 Clicks" Mobility   Outcome Measure  Help needed turning from your back to your side while in a flat bed without using bedrails?: A Lot Help needed moving from lying on your back to sitting on the side of a flat  bed without using bedrails?: Total Help needed moving to and from a bed to a chair (including a wheelchair)?: Total Help needed standing up from a chair using your arms (e.g., wheelchair or bedside chair)?: A Lot Help needed to walk in hospital room?: Total Help needed climbing 3-5 steps with a railing? : Total 6 Click Score: 8    End of Session   Activity Tolerance: Other (comment) (limited by dizziness/nausea) Patient left: in bed;with call bell/phone within reach;with bed alarm set;with SCD's reapplied   PT Visit Diagnosis: Other abnormalities of gait and mobility (R26.89);Muscle weakness (generalized) (M62.81);History of falling (Z91.81);Difficulty in walking, not elsewhere classified (R26.2);Unsteadiness on feet (R26.81);Dizziness and giddiness (R42)     Time: [id number] PT Time Calculation (min) (ACUTE ONLY): 33 min  Charges:   $Therapeutic Exercise: 8-22 mins $Therapeutic Activity: 8-22 mins                     Moishe Spice, PT, DPT Acute Rehabilitation Services  Office: [phone]    Orvan Falconer 04/29/2022, 5:30 PM

## 2022-04-30 ENCOUNTER — Inpatient Hospital Stay (HOSPITAL_COMMUNITY): Payer: Medicare Other

## 2022-04-30 DIAGNOSIS — I63112 Cerebral infarction due to embolism of left vertebral artery: Secondary | ICD-10-CM | POA: Diagnosis not present

## 2022-04-30 DIAGNOSIS — D72829 Elevated white blood cell count, unspecified: Secondary | ICD-10-CM | POA: Diagnosis not present

## 2022-04-30 DIAGNOSIS — I161 Hypertensive emergency: Secondary | ICD-10-CM | POA: Diagnosis not present

## 2022-04-30 DIAGNOSIS — R112 Nausea with vomiting, unspecified: Secondary | ICD-10-CM | POA: Diagnosis not present

## 2022-04-30 NOTE — Progress Notes (Signed)
STROKE TEAM PROGRESS NOTE   INTERVAL HISTORY His son is at the bedside. Patient continues to c/o dizziness with any eye movements, along with headache/nausea.  She states medication make it worse encourage rehab team and evaluated and feels she is no longer a candidate for inpatient rehab as she does not have the tolerance for therapy and SNF may be better vital signs stable.  Neurological exam unchanged continues to have mild left finger-to-nose dysmetria Plan to repeat head CT today  Vitals:   04/29/22 2329 04/30/22 0315 04/30/22 0904 04/30/22 1130  BP: 102/88 (!) 149/54 (!) 176/72 (!) 179/56  Pulse: 60 98 88 83  Resp: '18 18 18 18  '$ Temp: 97.7 F (36.5 C) 97.7 F (36.5 C) 98.2 F (36.8 C) 97.8 F (36.6 C)  TempSrc: Oral Oral    SpO2: 96% 100%  98%  Weight:  58.9 kg     CBC:  Recent Labs  Lab 04/25/22 0948 04/26/22 0320 04/29/22 0446  WBC 17.8* 17.5* 13.8*  NEUTROABS 7.5  --   --   HGB 14.6 14.0 11.2*  HCT 43.9 40.8 32.8*  MCV 88.3 87.6 88.9  PLT 418* 373 [id number]   Basic Metabolic Panel:  Recent Labs  Lab 04/25/22 0948 04/26/22 0320  NA 135 133*  K 3.4* 3.5  CL 100 97*  CO2 23 22  GLUCOSE 141* 131*  BUN 30* 19  CREATININE 0.65 0.64  CALCIUM 9.4 8.9  MG  --  1.4*  PHOS  --  3.0   Lipid Panel:  Recent Labs  Lab 04/26/22 0818  CHOL 233*  TRIG 292*  HDL 53  CHOLHDL 4.4  VLDL 58*  LDLCALC 122*    IMAGING past 24 hours No results found.  PHYSICAL EXAM  Temp:  [97.7 F (36.5 C)-98.2 F (36.8 C)] 97.8 F (36.6 C) (03/01 1130) Pulse Rate:  [60-99] 83 (03/01 1130) Resp:  [15-20] 18 (03/01 1130) BP: (102-179)/(54-88) 179/56 (03/01 1130) SpO2:  [96 %-100 %] 98 % (03/01 1130) Weight:  [58.9 kg] 58.9 kg (03/01 0315)  General - Well nourished, well developed, in no apparent distress. Cardiovascular - Sinus tachycardia on monitor.   Mental Status -  Level of arousal and orientation to time, place, and person were intact. Language including expression,  naming, repetition, comprehension was assessed and found intact.  Cranial Nerves II - XII - II - Visual field intact OU. III, IV, VI - Extraocular movements intact. C/o continued dizziness with any EOM.  Few beats of end gaze nystagmus on left gaze V - Facial sensation intact bilaterally. VII - Facial movement intact bilaterally. VIII - Hearing & vestibular intact bilaterally. X - Palate elevates symmetrically. XI - Chin turning & shoulder shrug intact bilaterally. XII - Tongue protrusion intact.  Motor - Generalized weakness in all extremities, pronator drift was absent.  Bulk and tone normal and fasciculations were absent.    Sensory - Light touch assessed and symmetrical.    Coordination -mild left finger-to-nose ataxia.  Left knee to heel coordination slow.  Normal coordination on the right Gait and Station - deferred.   ASSESSMENT/PLAN Ms. DAY VACCHIANO is a 89 y.o. female with history of uncontrolled hypertension, breast cancer, syncope, chronic neck pain presenting with presented to the emergency room for sudden onset of headache and just a general sensation of not feeling well upon awakening . CT angiogram revealed occlusion of the left vertebral artery, V2 level onwards with reconstitution proximal to the vertebrobasilar junction. MRI brain revealed left  cerebellar stroke with a possible small area of restricted diffusion in the right occipital lobe. IR procedure cancelled due to benefit vs risk stratification.   Acute Left posterior inferior cerebellar Infarct  Etiology:  occluded left vertebral artery secondary to atherosclerosis and uncontrolled hypertension Code Stroke CT head: No acute findings. No intracranial mass, hemorrhage or edema. Chronic small vessel ischemic changes in the white matter. CTA head & neck: Occluded Left Vertebral Artery in the V2 segment, appears new since November and with reconstitution only at the vertebrobasilar junction. This could be  secondary to vertebral artery dissection or atherosclerosis occlusion (see #2). Little atherosclerosis in the neck, but evidence of Advanced Intracranial Atherosclerosis with widespread Moderate And Severe 2nd and 3rd COW branch stenoses. No discrete branch occlusion MRI    Acute infarct in the medial aspect of the left cerebellar hemisphere. Additional region of increased signal on diffusion-weighted imaging in the medial aspect of the right occipital lobe, favored to represent an additional site of an acute to subacute infarct. Absent flow void in the V4 segment of the left vertebral artery, in keeping with known occlusion seen on same day CTA head/neck MRI November 2023:  No acute intracranial infarct or other abnormality. Stable brain MRI with chronic microvascular ischemic disease and chronic right frontal encephalomalacia. 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 No hemorrhagic transformation or posterior fossa mass effect at this time. No other acute intracranial abnormality by CT. Chronic supratentorial small and medium-sized vessel ischemia  2D Echo: LVEF 70-75%, Severe LVH, Grade I diastolic dysfunction, Mildly elevated PASP, Mild MVR, Trivial AVR  LDL 122 HgbA1c: 5.6 VTE prophylaxis - lovenox    Diet   Diet regular Room service appropriate? Yes; Fluid consistency: Thin   No antithrombotic prior to admission, now on aspirin 81 mg daily and clopidogrel 75 mg daily.  for 3 months, followed by aspirin alone.  Therapy recommendations:  AIR Disposition:  pending  Hypertension Home meds:  Toprol-XL '25mg'$ , Norvasc '10mg'$ , Hydralazine '50mg'$ , HCTZ '25mg'$ . Restarted.  UnStable. Per chart review, Patient's spouse states she is not taking her anti-hypertensive medications Permissive hypertension (OK if < 220/120) but gradually normalize in 5-7 days Long-term BP goal normotensive  Hyperlipidemia Home meds:   none LDL 122, goal < 70 Add Lipitor '20mg'$  due to known vascular disease and LD not in goal  Continue statin at discharge   Other Stroke Risk Factors Advanced Age >/= 56   Other Active Problems Nausea Zofran, Reglan added PRN Hx of syncope Previous Holter Monitor monitoring 12/2021 for syncope: NSR with episodes of tachycardia up to 120. Toprol-XL '25mg'$  daily added after this. Hx of chronic neck pain Hx of Breast Cancer  Hospital day # 5   Patient yet continues to have moderate headache as well as nausea and dizziness.  Continue meclizine 12.5 mg 3 times daily for dizziness.  Repeat head CT today.  Mobilize out of bed.  Therapy consults.  She will likely need inpatient rehab.  Discussed with her son at the bedside..  Discussed with rehab coordinator.  Likely transfer to rehab with nursing home setting next week long discussion with son at the bedside and answered questions about her management and need for rehab and answering questions greater than 50% time during this 35-minute visit was spent in counseling and coordination of care about cerebellar stroke and nausea and headache and answering questions discussion with care team.  Discussed with Dr. Lonny Prude.  No  family available at the bedside Antony Contras, [place] Pager: [phone] 04/30/2022 12:11 PM   To contact Stroke Continuity provider, please refer to [email or web address] After hours, contact General Neurology

## 2022-04-30 NOTE — Progress Notes (Signed)
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 PMH includes: uncontrolled HTN, and recent fall with concussion.    PT Comments    Per chart and RN, pt received both meclizine and reglan several hours prior to session. Upon arrival, pt reported feeling a little better today. She was able to roll in bed with increased ease and initiation by pt, only needing minA. However, when transitioning to sit EOB her dizziness increased again and she began to lean to her L, needing maxA for this transition and mod-maxA for briefly sitting statically EOB before being transferred to the chair before she fatigued. Pt required modA to stand and maxA to pivot with UE support on the therapist with a face-to-face approach. The nausea improved with pt smelling alcohol swabs once seated and supported in the chair. Pt able to tolerate several VOR exercises along with L AAROM and bil lower extremity AROM exercises seated in the chair before requesting to stop due to the dizziness/nausea. Pt needing encouragement to sit in the reclined chair for as long as she could tolerate. Of note, checked back on pt ~25 min later and pt was back in bed with nursing staff present in the room. Nursing staff reporting pt appeared to have a near syncope episode with the transfer back to the bed with them. She may benefit from checking her orthostatics in a future session. As pt is still not tolerating much activity and cannot currently tolerate the intensity of therapy in the AIR setting, updated d/c recs to SNF. However, if her activity tolerance improves soon, may update recs back to AIR. Will continue to  follow acutely.    Recommendations for follow up therapy are one component of a multi-disciplinary discharge planning process, led by the attending physician.  Recommendations may be updated based on patient status, additional functional criteria and insurance authorization.  Follow Up Recommendations  Skilled nursing-short term rehab (<3 hours/day) Can patient physically be transported by private vehicle: No   Assistance Recommended at Discharge Frequent or constant Supervision/Assistance  Patient can return home with the following Two people to help with walking and/or transfers;Two people to help with bathing/dressing/bathroom;Assistance with cooking/housework;Direct supervision/assist for medications management;Assistance with feeding;Direct supervision/assist for financial management;Assist for transportation;Help with stairs or ramp for entrance   Equipment Recommendations  Other (comment) (defer to post acute)    Recommendations for Other Services       Precautions / Restrictions Precautions Precautions: Fall Precaution Comments: dizziness; watch BP? Restrictions Weight Bearing Restrictions: No     Mobility  Bed Mobility Overal bed mobility: Needs Assistance Bed Mobility: Rolling, Sidelying to Sit Rolling: Min assist Sidelying to sit: Max assist, HOB elevated       General bed mobility comments: Pt with improved initiation to roll for pericare, rolling each direction 1x with minA at trunk to rotate and pt pulling on bed rail or therapist to roll. MaxA to manage trunk to sit up EOB with pt demonstrating strong L lateral lean and reporting feeling dizzy. Cues provided throughout for gaze fixation on an object or one of the "A"s hung up in the room.    Transfers Overall transfer level: Needs assistance Equipment used: 1  person hand held assist Transfers: Sit to/from Stand, Bed to chair/wheelchair/BSC Sit to Stand: Mod assist Stand pivot transfers: Max assist          General transfer comment: Pt holding onto therapist with face-to-face approach, needing modA to power up to stand with knees blocked for safety. MaxA to pivot pt to L from bed to chair. Cues provided throughout for gaze fixation on an object or one of the "A"s hung up in the room.    Ambulation/Gait               General Gait Details: unable   Stairs             Wheelchair Mobility    Modified Rankin (Stroke Patients Only) Modified Rankin (Stroke Patients Only) Pre-Morbid Rankin Score: No symptoms Modified Rankin: Severe disability     Balance Overall balance assessment: Needs assistance Sitting-balance support: Single extremity supported, Bilateral upper extremity supported, Feet supported Sitting balance-Leahy Scale: Poor Sitting balance - Comments: L lateral lean, needing mod-maxA for static sitting balance at EOB, only tolerating ~30 sec before needing to be transferred to chair due to reporting not being able to tolerate much more 2/2 dizziness. Postural control: Left lateral lean Standing balance support: Bilateral upper extremity supported, During functional activity Standing balance-Leahy Scale: Poor Standing balance comment: relies on external support                            Cognition Arousal/Alertness: Awake/alert Behavior During Therapy: Flat affect Overall Cognitive Status: Impaired/Different from baseline Area of Impairment: Problem solving, Awareness, Attention, Following commands                   Current Attention Level: Sustained   Following Commands: Follows one step commands consistently, Follows one step commands with increased time, Follows multi-step commands inconsistently   Awareness: Emergent Problem Solving: Slow processing, Decreased initiation, Difficulty sequencing, Requires verbal cues, Requires tactile cues General Comments: Pt limited by nausea and dizziness, needing extra time to respond to all cues and  questions. Pt also HOH. Poor initiation by pt with poor attention, often closing eyes. Pt also stating "I can't" intermittently and needing encouragement to try and participate.        Exercises General Exercises - Upper Extremity Shoulder Flexion: AAROM, Strengthening, Left, 10 reps, Seated General Exercises - Lower Extremity Long Arc Quad: AROM, Strengthening, Both, 10 reps, Seated Hip Flexion/Marching: AROM, Strengthening, Both, 10 reps, Seated Other Exercises Other Exercises: VOR exercises with pt seated in chair, cuing pt to only move as fast as she can tolerate without worsening her symptoms - eyes static on "A" with head rotating L <> R and then head nodding up <> down for x5 reps with "A" anterior to her, head static with eyes following "A" anterior to her being moved up <> down and then L <> R 3x each to pt's tolerated speed    General Comments General comments (skin integrity, edema, etc.): alcohol swabs used for pt to sniff to reduce nausea throughout; encouraged pt to sit up x1 hour if able, but pt stating "I can't", thus instead encouraged pt to sit up as long as she can tolerate and call nursing when she needs to return to bed. Noted pt back in bed with nursing present in room ~25 min later.      Pertinent Vitals/Pain Pain Assessment Pain Assessment: Faces Faces Pain Scale: Hurts little more Pain Location: discomfort with  nausea and dizziness Pain Descriptors / Indicators: Discomfort, Grimacing Pain Intervention(s): Limited activity within patient's tolerance, Monitored during session, Repositioned    Home Living                          Prior Function            PT Goals (current goals can now be found in the care plan section) Acute Rehab PT Goals Patient Stated Goal: to reduce her dizziness and nausea PT Goal Formulation: With patient Time For Goal Achievement: 05/10/22 Potential to Achieve Goals: Fair Progress towards PT goals: Progressing toward  goals (slowly)    Frequency    Min 3X/week      PT Plan Discharge plan needs to be updated;Frequency needs to be updated    Co-evaluation              AM-PAC PT "6 Clicks" Mobility   Outcome Measure  Help needed turning from your back to your side while in a flat bed without using bedrails?: A Little Help needed moving from lying on your back to sitting on the side of a flat bed without using bedrails?: A Lot Help needed moving to and from a bed to a chair (including a wheelchair)?: A Lot Help needed standing up from a chair using your arms (e.g., wheelchair or bedside chair)?: A Lot Help needed to walk in hospital room?: Total Help needed climbing 3-5 steps with a railing? : Total 6 Click Score: 11    End of Session   Activity Tolerance: Other (comment) (limited by dizziness/nausea) Patient left: with call bell/phone within reach;in chair;with chair alarm set Nurse Communication: Mobility status (NT) PT Visit Diagnosis: Other abnormalities of gait and mobility (R26.89);Muscle weakness (generalized) (M62.81);History of falling (Z91.81);Difficulty in walking, not elsewhere classified (R26.2);Unsteadiness on feet (R26.81);Dizziness and giddiness (R42)     Time: 1410-1445 PT Time Calculation (min) (ACUTE ONLY): 35 min  Charges:  $Therapeutic Exercise: 8-22 mins $Therapeutic Activity: 8-22 mins                     Moishe Spice, PT, DPT Acute Rehabilitation Services  Office: [phone]    Orvan Falconer 04/30/2022, 4:45 PM

## 2022-04-30 NOTE — TOC Initial Note (Addendum)
Transition of Care [facility]) - Initial/Assessment Note    Patient Details  Name: Sherry [name] MRN: [id number] Date of Birth: May 24, 1930  Transition [facility]) CM/SW Contact:    Geralynn Ochs, LCSW Phone Number: 04/30/2022, 2:40 PM  Clinical Narrative:         CSW met with patient and son at bedside, patient agreeable to son discussing SNF placement. CSW discussed SNF placement with son, who has a preference for [facility] as the patient has been there before. [facility] does not have any beds available. CSW received permission to fax out referral, checked with [facility] on bed availability and they will review referral. CSW to follow.  UPDATE: CSW received call from [facility], who is concerned about patient being unable to participate with therapy. [facility] wants to see how patient progresses over the weekend and review early next week. CSW to follow.       Expected Discharge Plan: Skilled Nursing Facility Barriers to Discharge: Continued Medical Work up   Patient Goals and CMS Choice Patient states their goals for this hospitalization and ongoing recovery are:: to feel better CMS Medicare.gov Compare Post Acute Care list provided to:: Patient Choice offered to / list presented to : Patient, Adult Anon Ra[place] ownership interest in [facility].provided to:: Patient    Expected Discharge Plan and Services     Post Acute Care Choice: [place] Living arrangements for the past 2 months: Single Family Home                                      Prior Living Arrangements/Services Living arrangements for the past 2 months: Single Family Home Lives with:: Spouse Patient language and need for interpreter reviewed:: No Do you feel safe going back to the place where you live?: Yes      Need for Family Participation in Patient Care: Yes (Comment) Care giver support system in place?: No (comment)   Criminal Activity/Legal  Involvement Pertinent to Current Situation/Hospitalization: No - Comment as needed  Activities of Daily Living      Permission Sought/Granted Permission sought to share information with : Facility Sport and exercise psychologist, Family Supports Permission granted to share information with : Yes, Verbal Permission Granted  Share Information with NAME: [name]  Permission granted to share info w AGENCY: SNF  Permission granted to share info w Relationship: Son     Emotional Assessment Appearance:: Appears stated age Attitude/Demeanor/Rapport: Engaged Affect (typically observed): Appropriate Orientation: : Oriented to Self, Oriented to Place, Oriented to  Time, Oriented to Situation Alcohol / Substance Use: Not Applicable Psych Involvement: No (comment)  Admission diagnosis:  CVA (cerebral vascular accident) [facility]) [I63.9] Patient Active Problem List   Diagnosis Date Noted   Hypertensive emergency 04/26/2022   Cerebellar stroke ([place]) 04/26/2022   Left-sided cerebrovascular accident (CVA) ([place]) 04/25/2022   CVA (cerebral vascular accident) ([place]) 04/25/2022   UTI (urinary tract infection) [id number]   Acute metabolic encephalopathy 0000000   Encephalopathy 01/04/2022   Hypertensive urgency 01/03/2022   Back pain 05/29/2021   Left hip pain 05/28/2021   Hypokalemia 05/28/2021   Orthostatic hypotension 05/25/2021   Left sided sciatica 05/24/2021   Duodenitis 05/18/2021   Anemia of chronic disease 05/16/2021   Abdominal pain 05/15/2021   Thrombocytosis 05/15/2021   Essential hypertension 04/30/2021   Spinal stenosis at L4-L5 level 04/27/2021   Intractable low back  pain 04/27/2021   Low back pain 04/26/2021   Closed C1 fracture ([place]) 02/12/2020   Neck fracture ([place]) 02/11/2020   Chest pain with moderate risk for cardiac etiology 05/05/2017   Shortness of breath 05/05/2017   Hyperlipidemia 09/25/2016   Neuropathy 09/25/2016   Statin intolerance 09/25/2016   Anxiety 09/25/2016    Palpitations 09/25/2016   PCP:  Rusty Aus, MD Pharmacy:   [place], [place] [address] [place] [address] Phone: [phone] Fax: [phone]  [facility] [place], [place] [address] Phone: [phone] Fax: [phone]  [facility] [address] [place] [address] Phone: [phone] Fax: [phone]  [place] [id number] [id number]: [phone]: [phone]    Social Determinants of Health ([place] Social History: [place] No Food Insecurity (01/07/2022)  Housing: Low Risk  (01/07/2022)  Transportation Needs: No Transportation Needs (01/07/2022)  Utilities: Not At Risk (01/07/2022)  Tobacco Use: Low Risk  (04/25/2022)   SDOH Interventions:     Readmission Risk Interventions    01/07/2022    2:50 PM  Readmission Risk Prevention Plan  Post Dischage Appt Complete  Medication Screening Complete  Transportation Screening Complete

## 2022-04-30 NOTE — NC FL2 (Signed)
[place] LEVEL OF CARE FORM     IDENTIFICATION  Patient Name: Sherry [name] Birthdate: [birth year] Sex: female Admission Date (Current Location): 04/25/2022  [facility] and [place] Number:  Engineering geologist and Address:  [facility] [place] [address], [place], [place] [address]      Provider Number: [id number]  Attending Physician Name and Address:  Mariel Aloe, MD  Relative Name and Phone Number:       Current Level of Care: Hospital Recommended Level of Care: [place] Prior Approval Number:    Date Approved/Denied:   PASRR Number: [id number]  Discharge Plan: SNF    Current Diagnoses: Patient Active Problem List   Diagnosis Date Noted   Hypertensive emergency 04/26/2022   Cerebellar stroke ([place]) 04/26/2022   Left-sided cerebrovascular accident (CVA) ([place]) 04/25/2022   CVA (cerebral vascular accident) ([place]) 04/25/2022   UTI (urinary tract infection) [id number]   Acute metabolic encephalopathy 0000000   Encephalopathy 01/04/2022   Hypertensive urgency 01/03/2022   Back pain 05/29/2021   Left hip pain 05/28/2021   Hypokalemia 05/28/2021   Orthostatic hypotension 05/25/2021   Left sided sciatica 05/24/2021   Duodenitis 05/18/2021   Anemia of chronic disease 05/16/2021   Abdominal pain 05/15/2021   Thrombocytosis 05/15/2021   Essential hypertension 04/30/2021   Spinal stenosis at L4-L5 level 04/27/2021   Intractable low back pain 04/27/2021   Low back pain 04/26/2021   Closed C1 fracture ([place]) 02/12/2020   Neck fracture ([place]) 02/11/2020   Chest pain with moderate risk for cardiac etiology 05/05/2017   Shortness of breath 05/05/2017   Hyperlipidemia 09/25/2016   Neuropathy 09/25/2016   Statin intolerance 09/25/2016   Anxiety 09/25/2016   Palpitations 09/25/2016    Orientation RESPIRATION BLADDER Height & Weight     Self, Time, Situation, Place  Normal Continent Weight: 129 lb 13.6 oz (58.9  kg) Height:     BEHAVIORAL SYMPTOMS/MOOD NEUROLOGICAL BOWEL NUTRITION STATUS      Continent Diet (regular)  AMBULATORY STATUS COMMUNICATION OF NEEDS Skin   Extensive Assist Verbally Normal                       Personal Care Assistance Level of Assistance  Bathing, Feeding, Dressing Bathing Assistance: Maximum assistance Feeding assistance: Limited assistance Dressing Assistance: Maximum assistance     Functional Limitations Info             SPECIAL CARE FACTORS FREQUENCY  PT (By licensed PT), OT (By licensed OT)     PT Frequency: 5x/wk OT Frequency: 5x/wk            Contractures Contractures Info: Not present    Additional Factors Info  Code Status, Allergies Code Status Info: Full Allergies Info: Morphine, Buprenorphine Hcl, Codeine, Epinephrine, Morphine And Related, Percocet (Oxycodone-acetaminophen)           Current Medications (04/30/2022):  This is the current hospital active medication list Current Facility-Administered Medications  Medication Dose Route Frequency Provider Last Rate Last Admin   acetaminophen (TYLENOL) tablet 650 mg  650 mg Oral Q6H PRN Lovey Newcomer C, NP   650 mg at 04/29/22 0419   amLODipine (NORVASC) tablet 10 mg  10 mg Oral Daily Mariel Aloe, MD   10 mg at 04/30/22 1021   aspirin EC tablet 81 mg  81 mg Oral Daily Bhagat, Srishti L, MD   81 mg at 04/30/22 1021   atorvastatin (LIPITOR) tablet 40 mg  40 mg Oral Daily Lovey Newcomer C, NP   40 mg at 04/30/22 1021   clopidogrel (PLAVIX) tablet 75 mg  75 mg Oral Daily Bhagat, Srishti L, MD   75 mg at 04/30/22 1020   docusate sodium (COLACE) capsule 100 mg  100 mg Oral BID PRN Noe Gens L, NP   100 mg at 04/29/22 2313   enoxaparin (LOVENOX) injection 40 mg  40 mg Subcutaneous Q24H Jacky Kindle, MD   40 mg at 04/30/22 1158   hydrALAZINE (APRESOLINE) injection 10 mg  10 mg Intravenous Q4H PRN Lovey Newcomer C, NP   10 mg at 04/27/22 2348   hydrALAZINE (APRESOLINE) tablet 50 mg  50 mg  Oral Q8H Bowser, Laurel Dimmer, NP   50 mg at 04/30/22 1329   meclizine (ANTIVERT) tablet 12.5 mg  12.5 mg Oral BID Beulah Gandy A, NP   12.5 mg at 04/30/22 1023   metoCLOPramide (REGLAN) injection 5 mg  5 mg Intravenous Q12H Lovey Newcomer C, NP   5 mg at 04/30/22 1021   metoprolol succinate (TOPROL-XL) 24 hr tablet 25 mg  25 mg Oral Daily Bowser, Laurel Dimmer, NP   25 mg at 04/30/22 1020   Oral care mouth rinse  15 mL Mouth Rinse PRN Little Ishikawa, MD       polyethylene glycol (MIRALAX / GLYCOLAX) packet 17 g  17 g Oral Daily PRN Noe Gens L, NP   17 g at 04/29/22 2212     Discharge Medications: Please see discharge summary for a list of discharge medications.  Relevant Imaging Results:  Relevant Lab Results:   Additional Information SS#: [id number]  Geralynn Ochs, LCSW

## 2022-04-30 NOTE — Progress Notes (Signed)
PROGRESS NOTE    Sherry [name]  [id number] DOB: [birth year] DOA: 04/25/2022 PCP: Rusty Aus, MD   Brief Narrative: Sherry [name] is a 89 y.o. female with a history of hypertension, recurrent falls, breast cancer status postlumpectomy and radiation.  Patient presented secondary to headache, nausea, vomiting with associated dizziness.  MRI imaging confirmed presence of acute cerebellar infarct and CT angiogram of the head and neck was significant for left vertebral artery occlusion.  Patient was admitted for hypertensive emergency and acute stroke, initially requiring ICU admission and Cleviprex drip.  For antihypertensives restarted and Cleviprex weaned off.  Patient with intractable vertigo/nausea secondary to acute infarct.  Physical therapy recommending acute inpatient rehab on discharge, however no plan for discharge to SNF.   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 MRI confirms acute infarct of medial aspect of left cerebellar hemisphere in addition to likely acute/subacute infarct of the medial aspect of the right occipital lobe. CTA head and neck significant for an occluded left vertebral artery in the V2 segment. LDL of 122. Hemoglobin A1C of 5.6%. Transthoracic Echocardiogram significant for no atrial level shunt. Neurology recommendations for aspirin indefinitely and Plavix 75 mg for three months. PT/OT recommendations for acute inpatient rehabilitation  Hypertensive emergency Per history, patient's outpatient antihypertensives discontinued in December. Patient managed initially on Cleviprex and able to wean off. -Continue amlodipine 10 mg daily -Continue hydralazine and metoprolol succinate  Leukocytosis No source for infection noted. Trended down.  Lactic acidosis Present on admission. Improved with IV fluids.  Intractable nausea/vomiting Intractable vertigo Secondary to acute  stroke. Therapies have not been successful in treating symptoms. -Continue Reglan scheduled and meclizine scheduled  Right breast nodule/mass 19 mm mixed density nodule/mass noted on CT chest. Patient with a history of right breast cancer s/p lumpectomy. Patient will need follow-up for nodule with mammography.   DVT prophylaxis: Lovenox Code Status:   Code Status: Full Code Family Communication: None at bedside Disposition Plan: Discharge to SNF when bed available.   Consultants:  PCCM Neurology  Procedures:  2/26: Transthoracic Echocardiogram  Antimicrobials: None    Subjective: No specific issues other than continued vertigo. No episodes of emesis.  Objective: BP (!) 179/56 (BP Location: Left Arm)   Pulse 83   Temp 97.8 F (36.6 C)   Resp 18   Wt 58.9 kg   SpO2 98%   BMI 23.75 kg/m   Examination:  General: Well appearing, no distress   Data Reviewed: I have personally reviewed following labs and imaging studies  CBC Lab Results  Component Value Date   WBC 13.8 (H) 04/29/2022   RBC 3.69 (L) 04/29/2022   HGB 11.2 (L) 04/29/2022   HCT 32.8 (L) 04/29/2022   MCV 88.9 04/29/2022   MCH 30.4 04/29/2022   PLT 343 04/29/2022   MCHC 34.1 04/29/2022   RDW 13.4 04/29/2022   LYMPHSABS 8.8 (H) 04/25/2022   MONOABS 0.9 04/25/2022   EOSABS 0.1 04/25/2022   BASOSABS 0.1 [id number]     Last metabolic panel Lab Results  Component Value Date   NA 133 (L) 04/26/2022   K 3.5 04/26/2022   CL 97 (L) 04/26/2022   CO2 22 04/26/2022   BUN 19 04/26/2022   CREATININE 0.64 04/26/2022   GLUCOSE 131 (H) 04/26/2022   GFRNONAA >60 04/26/2022   GFRAA >60 08/15/2017   CALCIUM 8.9 04/26/2022   PHOS 3.0 04/26/2022   PROT 7.0 04/25/2022  ALBUMIN 4.1 04/25/2022   BILITOT 0.9 04/25/2022   ALKPHOS 43 04/25/2022   AST 26 04/25/2022   ALT 23 04/25/2022   ANIONGAP 14 04/26/2022    GFR: Estimated Creatinine Clearance: 37.7 mL/min (by C-G formula based on SCr of 0.64  mg/dL).  Recent Results (from the past 240 hour(s))  MRSA Next Gen by PCR, Nasal     Status: None   Collection Time: 04/25/22  5:23 PM   Specimen: Nasal Mucosa; Nasal Swab  Result Value Ref Range Status   MRSA by PCR Next Gen NOT DETECTED NOT DETECTED Final    Comment: (NOTE) The GeneXpert MRSA Assay (FDA approved for NASAL specimens only), is one component of a comprehensive MRSA colonization surveillance program. It is not intended to diagnose MRSA infection nor to guide or monitor treatment for MRSA infections. Test performance is not FDA approved in patients less than 81 years old. Performed at [facility], [place] [address] [place], [place] [address]   Culture, blood (Routine X 2) w Reflex to ID Panel     Status: None (Preliminary result)   Collection Time: 04/26/22  8:18 AM   Specimen: BLOOD RIGHT HAND  Result Value Ref Range Status   Specimen Description BLOOD RIGHT HAND  Final   Special Requests   Final    BOTTLES DRAWN AEROBIC AND ANAEROBIC Blood Culture adequate volume   Culture   Final    NO GROWTH 4 DAYS Performed at [facility], [place] [address], [place], [place] [address]    Report Status PENDING  Incomplete  Culture, blood (Routine X 2) w Reflex to ID Panel     Status: None (Preliminary result)   Collection Time: 04/26/22  8:23 AM   Specimen: BLOOD RIGHT HAND  Result Value Ref Range Status   Specimen Description BLOOD RIGHT HAND  Final   Special Requests   Final    BOTTLES DRAWN AEROBIC AND ANAEROBIC Blood Culture adequate volume   Culture   Final    NO GROWTH 4 DAYS Performed at [facility], [place] [address] [place], [place] [address]    Report Status PENDING  Incomplete      Radiology Studies: No results found.    LOS: 5 days    Cordelia Poche, MD [facility] 04/30/2022, 12:38 PM   If 7PM-7AM, please contact night-coverage [email or web address]

## 2022-05-01 ENCOUNTER — Inpatient Hospital Stay (HOSPITAL_COMMUNITY): Payer: Medicare Other

## 2022-05-01 DIAGNOSIS — I63112 Cerebral infarction due to embolism of left vertebral artery: Secondary | ICD-10-CM | POA: Diagnosis not present

## 2022-05-01 DIAGNOSIS — I161 Hypertensive emergency: Secondary | ICD-10-CM | POA: Diagnosis not present

## 2022-05-01 DIAGNOSIS — R112 Nausea with vomiting, unspecified: Secondary | ICD-10-CM | POA: Diagnosis not present

## 2022-05-01 DIAGNOSIS — R5383 Other fatigue: Secondary | ICD-10-CM | POA: Diagnosis not present

## 2022-05-01 DIAGNOSIS — D72829 Elevated white blood cell count, unspecified: Secondary | ICD-10-CM | POA: Diagnosis not present

## 2022-05-01 DIAGNOSIS — I639 Cerebral infarction, unspecified: Secondary | ICD-10-CM | POA: Diagnosis not present

## 2022-05-01 DIAGNOSIS — I63212 Cerebral infarction due to unspecified occlusion or stenosis of left vertebral arteries: Secondary | ICD-10-CM | POA: Diagnosis not present

## 2022-05-01 LAB — BASIC METABOLIC PANEL
Anion gap: 13 (ref 5–15)
BUN: 44 mg/dL — ABNORMAL HIGH (ref 8–23)
CO2: 22 mmol/L (ref 22–32)
Calcium: 9.5 mg/dL (ref 8.9–10.3)
Chloride: 102 mmol/L (ref 98–111)
Creatinine, Ser: 0.65 mg/dL (ref 0.44–1.00)
GFR, Estimated: >60 mL/min (ref >60)
Glucose, Bld: 118 mg/dL — ABNORMAL HIGH (ref 70–99)
Potassium: 3.4 mmol/L — ABNORMAL LOW (ref 3.5–5.1)
Sodium: 137 mmol/L (ref 135–145)

## 2022-05-01 LAB — CBC
HCT: 23.3 % — ABNORMAL LOW (ref 36.0–46.0)
HCT: 24.2 % — ABNORMAL LOW (ref 36.0–46.0)
Hemoglobin: 8.1 g/dL — ABNORMAL LOW (ref 12.0–15.0)
Hemoglobin: 7.8 g/dL — ABNORMAL LOW (ref 12.0–15.0)
MCH: 31.3 pg (ref 26.0–34.0)
MCH: 30.1 pg (ref 26.0–34.0)
MCHC: 34.8 g/dL (ref 30.0–36.0)
MCHC: 32.2 g/dL (ref 30.0–36.0)
MCV: 90 fL (ref 80.0–100.0)
MCV: 93.4 fL (ref 80.0–100.0)
Platelets: 331 10*3/uL (ref 150–400)
Platelets: 344 10*3/uL (ref 150–400)
RBC: 2.59 MIL/uL — ABNORMAL LOW (ref 3.87–5.11)
RBC: 2.59 MIL/uL — ABNORMAL LOW (ref 3.87–5.11)
RDW: 13.7 % (ref 11.5–15.5)
RDW: 14 % (ref 11.5–15.5)
WBC: 21 10*3/uL — ABNORMAL HIGH (ref 4.0–10.5)
WBC: 20.4 10*3/uL — ABNORMAL HIGH (ref 4.0–10.5)
nRBC: 0.2 % (ref 0.0–0.2)
nRBC: 0.2 % (ref 0.0–0.2)

## 2022-05-01 LAB — CULTURE, BLOOD (ROUTINE X 2)
Culture: NO GROWTH
Culture: NO GROWTH
Special Requests: ADEQUATE
Special Requests: ADEQUATE

## 2022-05-01 LAB — URINALYSIS, W/ REFLEX TO CULTURE (INFECTION SUSPECTED)
Bacteria, UA: NONE SEEN
Bilirubin Urine: NEGATIVE
Glucose, UA: NEGATIVE mg/dL
Hgb urine dipstick: NEGATIVE
Ketones, ur: NEGATIVE mg/dL
Leukocytes,Ua: NEGATIVE
Nitrite: NEGATIVE
Protein, ur: NEGATIVE mg/dL
Specific Gravity, Urine: 1.017 (ref 1.005–1.030)
pH: 5 (ref 5.0–8.0)

## 2022-05-01 LAB — DIFFERENTIAL
Abs Immature Granulocytes: 0.46 10*3/uL — ABNORMAL HIGH (ref 0.00–0.07)
Basophils Absolute: 0.1 10*3/uL (ref 0.0–0.1)
Basophils Relative: 0 %
Eosinophils Absolute: 0.1 10*3/uL (ref 0.0–0.5)
Eosinophils Relative: 0 %
Immature Granulocytes: 2 %
Lymphocytes Relative: 24 %
Lymphs Abs: 4.8 10*3/uL — ABNORMAL HIGH (ref 0.7–4.0)
Monocytes Absolute: 1.4 10*3/uL — ABNORMAL HIGH (ref 0.1–1.0)
Monocytes Relative: 7 %
Neutro Abs: 13.6 10*3/uL — ABNORMAL HIGH (ref 1.7–7.7)
Neutrophils Relative %: 67 %

## 2022-05-01 LAB — GLUCOSE, CAPILLARY: Glucose-Capillary: 141 mg/dL — ABNORMAL HIGH (ref 70–99)

## 2022-05-01 MED ORDER — SODIUM CHLORIDE 0.9 % IV BOLUS
500.0000 mL | Freq: Once | INTRAVENOUS | Status: AC
  Administered 2022-05-01: 500 mL via INTRAVENOUS

## 2022-05-01 MED ORDER — SODIUM CHLORIDE 0.9 % IV SOLN: INTRAVENOUS | Status: DC

## 2022-05-01 MED ORDER — POTASSIUM CHLORIDE CRYS ER 20 MEQ PO TBCR
40.0000 meq | EXTENDED_RELEASE_TABLET | Freq: Once | ORAL | Status: AC
  Administered 2022-05-01: 40 meq via ORAL
  Filled 2022-05-01: qty 2

## 2022-05-01 MED ORDER — HYDRALAZINE HCL 50 MG PO TABS
75.0000 mg | ORAL_TABLET | Freq: Three times a day (TID) | ORAL | Status: DC
  Administered 2022-05-01 – 2022-05-02 (×2): 75 mg via ORAL
  Filled 2022-05-01 (×2): qty 1

## 2022-05-01 NOTE — Plan of Care (Signed)
  Problem: Education: Goal: Knowledge of disease or condition will improve Outcome: Progressing   Problem: Coping: Goal: Will identify appropriate support needs Outcome: Progressing   Problem: Self-Care: Goal: Verbalization of feelings and concerns over difficulty with self-care will improve Outcome: Progressing   Problem: Clinical Measurements: Goal: Will remain free from infection Outcome: Progressing   Problem: Activity: Goal: Risk for activity intolerance will decrease Outcome: Progressing   Problem: Skin Integrity: Goal: Risk for impaired skin integrity will decrease Outcome: Progressing

## 2022-05-01 NOTE — Progress Notes (Signed)
PROGRESS NOTE    Sherry [name]  [id number] DOB: [birth year] DOA: 04/25/2022 PCP: Rusty Aus, MD   Brief Narrative: Sherry [name] is a 89 y.o. female with a history of hypertension, recurrent falls, breast cancer status postlumpectomy and radiation.  Patient presented secondary to headache, nausea, vomiting with associated dizziness.  MRI imaging confirmed presence of acute cerebellar infarct and CT angiogram of the head and neck was significant for left vertebral artery occlusion.  Patient was admitted for hypertensive emergency and acute stroke, initially requiring ICU admission and Cleviprex drip.  For antihypertensives restarted and Cleviprex weaned off.  Patient with intractable vertigo/nausea secondary to acute infarct.  Physical therapy recommending acute inpatient rehab on discharge, however no plan for discharge to SNF.   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 MRI confirms acute infarct of medial aspect of left cerebellar hemisphere in addition to likely acute/subacute infarct of the medial aspect of the right occipital lobe. CTA head and neck significant for an occluded left vertebral artery in the V2 segment. LDL of 122. Hemoglobin A1C of 5.6%. Transthoracic Echocardiogram significant for no atrial level shunt. Neurology recommendations for aspirin indefinitely and Plavix 75 mg for three months. PT/OT recommendations for acute inpatient rehabilitation, now with plan to discharge to SNF.  Hypertensive emergency Per history, patient's outpatient antihypertensives discontinued in December. Patient managed initially on Cleviprex and able to wean off. -Continue amlodipine 10 mg daily -Continue hydralazine and metoprolol succinate  Leukocytosis No source for infection noted. Trended down.  Lactic acidosis Present on admission. Improved with IV fluids.  Intractable  nausea/vomiting Intractable vertigo Secondary to acute stroke. Therapies have not been successful in treating symptoms. -Continue Reglan scheduled and meclizine scheduled  Right breast nodule/mass 19 mm mixed density nodule/mass noted on CT chest. Patient with a history of right breast cancer s/p lumpectomy. Patient will need follow-up for nodule with mammography.   DVT prophylaxis: Lovenox Code Status:   Code Status: Full Code Family Communication: None at bedside Disposition Plan: Discharge to SNF when bed available.   Consultants:  PCCM Neurology  Procedures:  2/26: Transthoracic Echocardiogram  Antimicrobials: None    Subjective: Continued vertigo. Symptoms are mildly improved. No emesis.  Objective: BP (!) 177/58 (BP Location: Left Arm) Comment: RN Notified  Pulse 84   Temp 97.7 F (36.5 C) (Oral)   Resp 17   Wt 56.9 kg   SpO2 100%   BMI 22.94 kg/m   Examination:  General exam: Appears calm Respiratory system: Respiratory effort normal. Gastrointestinal system: Abdomen is non-distended Central nervous system: Alert and oriented.   Data Reviewed: I have personally reviewed following labs and imaging studies  CBC Lab Results  Component Value Date   WBC 13.8 (H) 04/29/2022   RBC 3.69 (L) 04/29/2022   HGB 11.2 (L) 04/29/2022   HCT 32.8 (L) 04/29/2022   MCV 88.9 04/29/2022   MCH 30.4 04/29/2022   PLT 343 04/29/2022   MCHC 34.1 04/29/2022   RDW 13.4 04/29/2022   LYMPHSABS 8.8 (H) 04/25/2022   MONOABS 0.9 04/25/2022   EOSABS 0.1 04/25/2022   BASOSABS 0.1 [id number]     Last metabolic panel Lab Results  Component Value Date   NA 133 (L) 04/26/2022   K 3.5 04/26/2022   CL 97 (L) 04/26/2022   CO2 22 04/26/2022   BUN 19 04/26/2022   CREATININE 0.64 04/26/2022   GLUCOSE 131 (H) 04/26/2022   GFRNONAA >60 04/26/2022  GFRAA >60 08/15/2017   CALCIUM 8.9 04/26/2022   PHOS 3.0 04/26/2022   PROT 7.0 04/25/2022   ALBUMIN 4.1 04/25/2022   BILITOT  0.9 04/25/2022   ALKPHOS 43 04/25/2022   AST 26 04/25/2022   ALT 23 04/25/2022   ANIONGAP 14 04/26/2022    GFR: Estimated Creatinine Clearance: 37.7 mL/min (by C-G formula based on SCr of 0.64 mg/dL).  Recent Results (from the past 240 hour(s))  MRSA Next Gen by PCR, Nasal     Status: None   Collection Time: 04/25/22  5:23 PM   Specimen: Nasal Mucosa; Nasal Swab  Result Value Ref Range Status   MRSA by PCR Next Gen NOT DETECTED NOT DETECTED Final    Comment: (NOTE) The GeneXpert MRSA Assay (FDA approved for NASAL specimens only), is one component of a comprehensive MRSA colonization surveillance program. It is not intended to diagnose MRSA infection nor to guide or monitor treatment for MRSA infections. Test performance is not FDA approved in patients less than 22 years old. Performed at [facility], [place] [address] [place], [place] [address]   Culture, blood (Routine X 2) w Reflex to ID Panel     Status: None   Collection Time: 04/26/22  8:18 AM   Specimen: BLOOD RIGHT HAND  Result Value Ref Range Status   Specimen Description BLOOD RIGHT HAND  Final   Special Requests   Final    BOTTLES DRAWN AEROBIC AND ANAEROBIC Blood Culture adequate volume   Culture   Final    NO GROWTH 5 DAYS Performed at [facility], [place] [address], [place], [place] [address]    Report Status 05/01/2022 FINAL  Final  Culture, blood (Routine X 2) w Reflex to ID Panel     Status: None   Collection Time: 04/26/22  8:23 AM   Specimen: BLOOD RIGHT HAND  Result Value Ref Range Status   Specimen Description BLOOD RIGHT HAND  Final   Special Requests   Final    BOTTLES DRAWN AEROBIC AND ANAEROBIC Blood Culture adequate volume   Culture   Final    NO GROWTH 5 DAYS Performed at [facility], [place] [address] [place], [place] [address]    Report Status 05/01/2022 FINAL  Final      Radiology Studies: CT HEAD WO CONTRAST (5MM)  Result Date: 04/30/2022 CLINICAL DATA:  Stroke  follow-up EXAM: CT HEAD WITHOUT CONTRAST TECHNIQUE: Contiguous axial images were obtained from the base of the skull through the vertex without intravenous contrast. RADIATION DOSE REDUCTION: This exam was performed according to the departmental dose-optimization program which includes automated exposure control, adjustment of the mA and/or kV according to patient size and/or use of iterative reconstruction technique. COMPARISON:  CT Head 04/26/22, MRI Head 04/25/22 FINDINGS: Brain: Evolving left cerebellar infarct. No evidence of hemorrhage. No CT evidence of a new infarct. Redemonstrated is a subacute right occipital lobe infarct and a chronic right frontal lobe infarct. There is also a chronic infarct in the left caudate head. Sequela of moderate chronic microvascular ischemic change. No hydrocephalus. No extra-axial fluid collection. Vascular: No hyperdense vessel or unexpected calcification. Skull: Normal. Negative for fracture or focal lesion. Sinuses/Orbits: No mastoid or middle ear effusion. Frothy secretions in the left sphenoid sinus in the posterior right ethmoid air cells, which can be seen in the setting of acute sinusitis. Bilateral lens replacement. Orbits are otherwise unremarkable. Other: None. IMPRESSION: Evolving left cerebellar infarct. No evidence of hemorrhage. No CT evidence  of a new infarct. No hydrocephalus. Electronically Signed   By: Marin Roberts M.D.   On: 04/30/2022 12:38      LOS: 6 days    Cordelia Poche, MD [facility] 05/01/2022, 11:20 AM   If 7PM-7AM, please contact night-coverage [email or web address]

## 2022-05-01 NOTE — Progress Notes (Signed)
Urinary retention, bladder scan showed >986m, informed to Dr., ordered for [facility], 13034moutput noted in bag after catheterization at 1625

## 2022-05-01 NOTE — Plan of Care (Signed)
  Problem: Fluid Volume: Goal: Hemodynamic stability will improve Outcome: Progressing   Problem: Clinical Measurements: Goal: Diagnostic test results will improve Outcome: Progressing Goal: Signs and symptoms of infection will decrease Outcome: Progressing   Problem: Education: Goal: Knowledge of disease or condition will improve Outcome: Progressing Goal: Knowledge of secondary prevention will improve (MUST DOCUMENT ALL) Outcome: Progressing Goal: Knowledge of patient specific risk factors will improve Elta [place] N/A or DELETE if not current risk factor) Outcome: Progressing   Problem: Ischemic Stroke/TIA Tissue Perfusion: Goal: Complications of ischemic stroke/TIA will be minimized Outcome: Progressing   Problem: Coping: Goal: Will verbalize positive feelings about self Outcome: Progressing Goal: Will identify appropriate support needs Outcome: Progressing   Problem: Health Behavior/Discharge Planning: Goal: Ability to manage health-related needs will improve Outcome: Progressing   Problem: Self-Care: Goal: Ability to participate in self-care as condition permits will improve Outcome: Progressing Goal: Verbalization of feelings and concerns over difficulty with self-care will improve Outcome: Progressing   Problem: Nutrition: Goal: Risk of aspiration will decrease Outcome: Progressing   Problem: Health Behavior/Discharge Planning: Goal: Ability to manage health-related needs will improve Outcome: Progressing   Problem: Clinical Measurements: Goal: Ability to maintain clinical measurements within normal limits will improve Outcome: Progressing Goal: Will remain free from infection Outcome: Progressing Goal: Diagnostic test results will improve Outcome: Progressing Goal: Cardiovascular complication will be avoided Outcome: Progressing   Problem: Pain Managment: Goal: General experience of comfort will improve Outcome: Progressing   Problem: Safety: Goal:  Ability to remain free from injury will improve Outcome: Progressing   Problem: Skin Integrity: Goal: Risk for impaired skin integrity will decrease Outcome: Progressing   Problem: Nutrition: Goal: Dietary intake will improve Outcome: Not Progressing   Problem: Activity: Goal: Risk for activity intolerance will decrease Outcome: Not Progressing   Problem: Nutrition: Goal: Adequate nutrition will be maintained Outcome: Not Progressing   Problem: Respiratory: Goal: Ability to maintain adequate ventilation will improve Outcome: Completed/Met   Problem: Health Behavior/Discharge Planning: Goal: Goals will be collaboratively established with patient/family Outcome: Completed/Met   Problem: Self-Care: Goal: Ability to communicate needs accurately will improve Outcome: Completed/Met   Problem: Education: Goal: Knowledge of General Education information will improve Description: Including pain rating scale, medication(s)/side effects and non-pharmacologic comfort measures Outcome: Completed/Met   Problem: Coping: Goal: Level of anxiety will decrease Outcome: Completed/Met   Problem: Elimination: Goal: Will not experience complications related to bowel motility Outcome: Completed/Met Goal: Will not experience complications related to urinary retention Outcome: Completed/Met   Problem: Clinical Measurements: Goal: Respiratory complications will improve Outcome: Not Applicable

## 2022-05-01 NOTE — Progress Notes (Signed)
STROKE TEAM PROGRESS NOTE   INTERVAL HISTORY His daughter and grandson are at the bedside. Patient lying in bed, eyes closed with cloth over the forehead. Very lethargic, soft voice, stated HA and dizziness about the same as yesterday. Per daughter, pt was sitting in chair yesterday, better than today, and no any po intake today yet. Will check BMP CBC and give some IVF.   Vitals:   05/01/22 0206 05/01/22 0500 05/01/22 0834 05/01/22 1005  BP: (!) 156/56 (!) 161/54 (!) 177/58   Pulse: 85 79 84   Resp: '16 14 17   '$ Temp: 98.3 F (36.8 C) 98 F (36.7 C) 97.7 F (36.5 C)   TempSrc: Axillary Axillary Oral   SpO2: 100% 100% 100%   Weight:    56.9 kg   CBC:  Recent Labs  Lab 04/25/22 0948 04/26/22 0320 04/29/22 0446  WBC 17.8* 17.5* 13.8*  NEUTROABS 7.5  --   --   HGB 14.6 14.0 11.2*  HCT 43.9 40.8 32.8*  MCV 88.3 87.6 88.9  PLT 418* 373 [id number]   Basic Metabolic Panel:  Recent Labs  Lab 04/25/22 0948 04/26/22 0320  NA 135 133*  K 3.4* 3.5  CL 100 97*  CO2 23 22  GLUCOSE 141* 131*  BUN 30* 19  CREATININE 0.65 0.64  CALCIUM 9.4 8.9  MG  --  1.4*  PHOS  --  3.0   Lipid Panel:  Recent Labs  Lab 04/26/22 0818  CHOL 233*  TRIG 292*  HDL 53  CHOLHDL 4.4  VLDL 58*  LDLCALC 122*    IMAGING past 24 hours No results found.  PHYSICAL EXAM  Temp:  [97.7 F (36.5 C)-98.3 F (36.8 C)] 97.7 F (36.5 C) (03/02 0834) Pulse Rate:  [73-85] 84 (03/02 0834) Resp:  [14-17] 17 (03/02 0834) BP: (138-177)/(53-67) 177/58 (03/02 0834) SpO2:  [98 %-100 %] 100 % (03/02 0834) Weight:  [56.9 kg] 56.9 kg (03/02 1005)  General - Well nourished, well developed, in no apparent distress. Cardiovascular - Sinus tachycardia on monitor.   Neuro - very lethargic, eyes open on voice, orientated to people but confused on place, not orientated to age or time. No aphasia, but paucity of speech, significant hypophonia, following all simple commands. Not cooperative with naming or repeating. No  gaze palsy, no nystagmus, tracking bilaterally, blinking to visual threat bilaterally, PERRL. No facial droop. Tongue midline. LUE 4-/5 and RUE 4/5, BLE 3/5. Sensation exam not cooperative due to lethargy, left FTN significant ataxia, right FTN grossly intact, gait not tested.     ASSESSMENT/PLAN [name] is a 89 y.o. female with history of uncontrolled hypertension, breast cancer, syncope, chronic neck pain presenting with presented to the emergency room for sudden onset of headache and just a general sensation of not feeling well upon awakening . CT angiogram revealed occlusion of the left vertebral artery, V2 level onwards with reconstitution proximal to the vertebrobasilar junction. MRI brain revealed left cerebellar stroke with a possible small area of restricted diffusion in the right occipital lobe. IR procedure cancelled due to benefit vs risk stratification.   Acute Left cerebellar Infarct and punctate right occipital infarct, etiology:  occluded left VA secondary to atherosclerosis vs. dissection Code Stroke CT head: No acute findings.   CTA head & neck: Occluded Left V2 segment, appears new since November and with reconstitution only at the vertebrobasilar junction. This could be secondary to vertebral artery dissection or atherosclerosis occlusion. Advanced Intracranial Atherosclerosis with widespread Moderate And Severe  2nd and 3rd COW branch stenoses. No discrete branch occlusion MRI  Acute infarct in the medial aspect of the left cerebellar hemisphere. Additional region of increased signal on diffusion-weighted imaging in the medial aspect of the right occipital lobe, favored to represent an additional site of an acute to subacute infarct. Repeat CT 3/1 Expected CT appearance of Left cerebellum PICA infarct.  2D Echo: LVEF 70-75% LDL 122 HgbA1c: 5.6 VTE prophylaxis - lovenox No antithrombotic prior to admission, now on aspirin 81 mg daily and clopidogrel 75 mg daily for 3  months, followed by aspirin alone given left VA occlusion.  Therapy recommendations:  AIR Disposition:  pending  Lethargy  Leukocytosis Anemia Persistent lethargy, worse 3/2 Could be due to HA, dizziness, encephalopathy from leukocytosis WBC 17.8->13.8->20.4 Hemoglobin 14.6->11.2->7.8 Afebrile UA negative CXR negative Stool occult blood pending Management per primary team  Hypertension Home meds:  Toprol-XL '25mg'$ , Norvasc '10mg'$ , Hydralazine '50mg'$ , HCTZ '25mg'$ . Restarted.  UnStable. Per chart review, Patient's spouse states she is not taking her anti-hypertensive medications Permissive hypertension (OK if < 220/120) but gradually normalize in 5-7 days Long-term BP goal normotensive  Hyperlipidemia Home meds:  none LDL 122, goal < 70 Add Lipitor '40mg'$  Continue statin at discharge   Other Stroke Risk Factors Advanced Age >/= 53   Other Active Problems Nausea Zofran, Reglan added PRN Hx of syncope Previous Holter Monitor monitoring 12/2021 for syncope: NSR with episodes of tachycardia up to 120. Toprol-XL '25mg'$  daily added after this. Hx of chronic neck pain Hx of Breast Cancer  Hospital day # 6   Patient lethargy worsened within the last 24 hours, continues to have dizziness and nausea, and I have ordered CBC and BMP. I discussed with Dr. Lonny Prude. I spent extensive face-to-face time with the patient, more than 50% of which was spent in counseling and coordination of care, reviewing test results, images and medication, and discussing the diagnosis, treatment plan and potential prognosis. This patient's care requiresreview of multiple databases, neurological assessment, discussion with daughter, other specialists and medical decision making of high complexity.    Sherry Hawking, MD PhD Stroke Neurology 05/01/2022 9:47 PM   To contact Stroke Continuity provider, please refer to [email or web address] After hours, contact General Neurology

## 2022-05-02 ENCOUNTER — Inpatient Hospital Stay (HOSPITAL_COMMUNITY): Payer: Medicare Other

## 2022-05-02 DIAGNOSIS — K922 Gastrointestinal hemorrhage, unspecified: Secondary | ICD-10-CM | POA: Insufficient documentation

## 2022-05-02 DIAGNOSIS — D62 Acute posthemorrhagic anemia: Secondary | ICD-10-CM | POA: Diagnosis not present

## 2022-05-02 DIAGNOSIS — I69398 Other sequelae of cerebral infarction: Secondary | ICD-10-CM

## 2022-05-02 DIAGNOSIS — D72829 Elevated white blood cell count, unspecified: Secondary | ICD-10-CM | POA: Insufficient documentation

## 2022-05-02 DIAGNOSIS — E872 Acidosis, unspecified: Secondary | ICD-10-CM | POA: Insufficient documentation

## 2022-05-02 DIAGNOSIS — I63212 Cerebral infarction due to unspecified occlusion or stenosis of left vertebral arteries: Secondary | ICD-10-CM | POA: Diagnosis not present

## 2022-05-02 DIAGNOSIS — I639 Cerebral infarction, unspecified: Secondary | ICD-10-CM | POA: Diagnosis not present

## 2022-05-02 DIAGNOSIS — R338 Other retention of urine: Secondary | ICD-10-CM | POA: Insufficient documentation

## 2022-05-02 DIAGNOSIS — K264 Chronic or unspecified duodenal ulcer with hemorrhage: Secondary | ICD-10-CM | POA: Diagnosis not present

## 2022-05-02 LAB — BASIC METABOLIC PANEL
Anion gap: 7 (ref 5–15)
BUN: 25 mg/dL — ABNORMAL HIGH (ref 8–23)
CO2: 22 mmol/L (ref 22–32)
Calcium: 8.4 mg/dL — ABNORMAL LOW (ref 8.9–10.3)
Chloride: 112 mmol/L — ABNORMAL HIGH (ref 98–111)
Creatinine, Ser: 0.48 mg/dL (ref 0.44–1.00)
GFR, Estimated: >60 mL/min (ref >60)
Glucose, Bld: 114 mg/dL — ABNORMAL HIGH (ref 70–99)
Potassium: 3.6 mmol/L (ref 3.5–5.1)
Sodium: 141 mmol/L (ref 135–145)

## 2022-05-02 LAB — CBC
HCT: 20.9 % — ABNORMAL LOW (ref 36.0–46.0)
Hemoglobin: 6.7 g/dL — CL (ref 12.0–15.0)
MCH: 30.2 pg (ref 26.0–34.0)
MCHC: 32.1 g/dL (ref 30.0–36.0)
MCV: 94.1 fL (ref 80.0–100.0)
Platelets: 297 10*3/uL (ref 150–400)
RBC: 2.22 MIL/uL — ABNORMAL LOW (ref 3.87–5.11)
RDW: 13.9 % (ref 11.5–15.5)
WBC: 14.5 10*3/uL — ABNORMAL HIGH (ref 4.0–10.5)
nRBC: 0.3 % — ABNORMAL HIGH (ref 0.0–0.2)

## 2022-05-02 LAB — OCCULT BLOOD X 1 CARD TO LAB, STOOL: Fecal Occult Bld: POSITIVE — AB

## 2022-05-02 LAB — HEMOGLOBIN AND HEMATOCRIT, BLOOD
HCT: 28.4 % — ABNORMAL LOW (ref 36.0–46.0)
Hemoglobin: 10 g/dL — ABNORMAL LOW (ref 12.0–15.0)

## 2022-05-02 LAB — GLUCOSE, CAPILLARY
Glucose-Capillary: 119 mg/dL — ABNORMAL HIGH (ref 70–99)
Glucose-Capillary: 158 mg/dL — ABNORMAL HIGH (ref 70–99)
Glucose-Capillary: 104 mg/dL — ABNORMAL HIGH (ref 70–99)
Glucose-Capillary: 179 mg/dL — ABNORMAL HIGH (ref 70–99)

## 2022-05-02 LAB — PREPARE RBC (CROSSMATCH)

## 2022-05-02 MED ORDER — IOHEXOL 350 MG/ML SOLN
75.0000 mL | Freq: Once | INTRAVENOUS | Status: AC | PRN
  Administered 2022-05-02: 75 mL via INTRAVENOUS

## 2022-05-02 MED ORDER — PANTOPRAZOLE INFUSION (NEW) - SIMPLE MED
8.0000 mg/h | INTRAVENOUS | Status: DC
  Administered 2022-05-02 – 2022-05-05 (×7): 8 mg/h via INTRAVENOUS
  Filled 2022-05-02 (×9): qty 100

## 2022-05-02 MED ORDER — PANTOPRAZOLE SODIUM 40 MG IV SOLR: 40.0000 mg | Freq: Two times a day (BID) | INTRAVENOUS | Status: DC

## 2022-05-02 MED ORDER — SODIUM CHLORIDE 0.9 % IV SOLN: INTRAVENOUS | Status: DC

## 2022-05-02 MED ORDER — CHLORHEXIDINE GLUCONATE CLOTH 2 % EX PADS
6.0000 | MEDICATED_PAD | Freq: Every day | CUTANEOUS | Status: DC
  Administered 2022-05-02 – 2022-05-12 (×11): 6 via TOPICAL

## 2022-05-02 MED ORDER — PANTOPRAZOLE 80MG IVPB - SIMPLE MED
80.0000 mg | Freq: Once | INTRAVENOUS | Status: AC
  Administered 2022-05-02: 80 mg via INTRAVENOUS
  Filled 2022-05-02: qty 100
  Filled 2022-05-02: qty 200

## 2022-05-02 MED ORDER — SODIUM CHLORIDE 0.9% IV SOLUTION: Freq: Once | INTRAVENOUS | Status: AC

## 2022-05-02 NOTE — Progress Notes (Signed)
STROKE TEAM PROGRESS NOTE   INTERVAL HISTORY His RN is at the bedside. Patient lying in bed, eyes open, lethargic but more awake alert and interactive, getting blood transfusion now, and told me that she is feeling better today. Hb this am 6.7 and occult blood positive. CT abd/pelvis concerning for duodenum ulcer. DAPT discontinued and GI consulted.     Vitals:   05/02/22 0923 05/02/22 1258 05/02/22 1338 05/02/22 1353  BP: (!) 158/53 (!) 153/62 (!) 168/51 (!) 156/53  Pulse: 78 78 71 76  Resp: '17 19 18 18  '$ Temp: (!) 97.5 F (36.4 C) (!) 97.4 F (36.3 C) 98 F (36.7 C) 97.7 F (36.5 C)  TempSrc: Oral Oral Oral   SpO2: 100% 100% 100%   Weight:       CBC:  Recent Labs  Lab 05/01/22 1251 05/02/22 0858  WBC 20.4*  21.0* 14.5*  NEUTROABS 13.6*  --   HGB 7.8*  8.1* 6.7*  HCT 24.2*  23.3* 20.9*  MCV 93.4  90.0 94.1  PLT 344  331 [id number]   Basic Metabolic Panel:  Recent Labs  Lab 04/26/22 0320 05/01/22 1251 05/02/22 0858  NA 133* 137 141  K 3.5 3.4* 3.6  CL 97* 102 112*  CO2 '22 22 22  '$ GLUCOSE 131* 118* 114*  BUN 19 44* 25*  CREATININE 0.64 0.65 0.48  CALCIUM 8.9 9.5 8.4*  MG 1.4*  --   --   PHOS 3.0  --   --    Lipid Panel:  Recent Labs  Lab 04/26/22 0818  CHOL 233*  TRIG 292*  HDL 53  CHOLHDL 4.4  VLDL 58*  LDLCALC 122*    IMAGING past 24 hours CT ABDOMEN PELVIS W CONTRAST  Result Date: 05/02/2022 CLINICAL DATA:  Abdominal pain, acute. Nonlocalized. History of duodenal perforation. EXAM: CT ABDOMEN AND PELVIS WITH CONTRAST TECHNIQUE: Multidetector CT imaging of the abdomen and pelvis was performed using the standard protocol following bolus administration of intravenous contrast. RADIATION DOSE REDUCTION: This exam was performed according to the departmental dose-optimization program which includes automated exposure control, adjustment of the mA and/or kV according to patient size and/or use of iterative reconstruction technique. CONTRAST:  45m OMNIPAQUE  IOHEXOL 350 MG/ML SOLN COMPARISON:  05/17/2021 FINDINGS: Lower chest: No acute abnormality. Hepatobiliary: No suspicious liver abnormality. Small cyst within central left liver measures 0.8 cm and is unchanged from previous exam. Lobe of gallbladder appears distended. There are 2 no gallbladder wall thickening. No bile duct dilatation. Stones within the gallbladder measuring up to 2 cm. Pancreas: Cyst in uncinate process measures 8 mm and is unchanged from the previous exam. No main duct dilatation, inflammation or distension. Spleen: Normal in size without focal abnormality. Adrenals/Urinary Tract: Normal adrenal glands. No kidney mass, hydronephrosis, or nephrolithiasis. Urinary bladder is decompressed around a Foley catheter. Stomach/Bowel: There is wall thickening involving the pylorus and descending portion of the duodenum with surrounding soft tissue stranding. Transmural gas collection is identified within the anterior wall of the pylorus measuring 1.2 cm, image 34/3. On the previous exam this measures 1.1 cm. This may represent an area of transmural ulceration versus small duodenal diverticulum. No sign of perforation. The remaining small bowel loops are unremarkable. No pathologic dilatation of the large or small bowel loops. Sigmoid diverticulosis without acute diverticulitis. Moderate retained stool noted within the colon. Vascular/Lymphatic: Aortic atherosclerosis. No enlarged abdominal or pelvic lymph nodes. Reproductive: Status post hysterectomy. No adnexal masses. Other: No free fluid or focal fluid collections. Musculoskeletal:  Lumbar spondylosis and facet arthropathy. No acute or suspicious osseous findings. Postoperative changes from prior lumpectomy noted within the right breast. Soft tissue density nodule within the right lower breast measures 1.7 cm, image 18/3 previously this measured 2.0 cm. IMPRESSION: 1. There is wall thickening involving the pylorus and descending portion of the duodenum  with surrounding soft tissue stranding. Transmural gas collection is identified within the anterior wall of the pylorus measuring 1.2 cm. On the previous exam this measures 1.1 cm. This may represent an area of transmural ulceration versus small duodenal diverticulum. No sign of perforation. 2. Gallstones with distended gallbladder. No gallbladder wall thickening. 3. Sigmoid diverticulosis without acute diverticulitis. 4. Moderate retained stool noted within the colon. Correlate for any clinical signs or symptoms of constipation. 5. Stable 8 mm cystic lesion in the uncinate process of the pancreas. Given the patient's age, no further workup or follow-up is recommended. 6. Soft tissue density nodule within the right lower breast measures 1.7 cm previously this measured 2.0 cm. 7.  Aortic Atherosclerosis (ICD10-I70.0). Electronically Signed   By: Kerby Moors M.D.   On: 05/02/2022 10:39   DG CHEST PORT 1 VIEW  Result Date: 05/01/2022 CLINICAL DATA:  Leukocytosis. EXAM: PORTABLE CHEST 1 VIEW COMPARISON:  Chest radiograph dated 04/25/2022. FINDINGS: No focal consolidation, pleural effusion, pneumothorax. Linear density along the lateral left lung most consistent with a skin fold. Repeat radiograph with repositioning of the patient may provide better evaluation. The cardiac silhouette is within normal limits. No acute osseous pathology. IMPRESSION: 1. No acute cardiopulmonary process. 2. Probable skin fold along the lateral left lung. Electronically Signed   By: Anner Crete M.D.   On: 05/01/2022 17:52    PHYSICAL EXAM  Temp:  [97.4 F (36.3 C)-98.5 F (36.9 C)] 97.7 F (36.5 C) (03/03 1353) Pulse Rate:  [71-86] 76 (03/03 1353) Resp:  [14-19] 18 (03/03 1353) BP: (149-168)/(45-62) 156/53 (03/03 1353) SpO2:  [97 %-100 %] 100 % (03/03 1338) Weight:  [59.6 kg] 59.6 kg (03/03 0359)  General - Well nourished, well developed, in no apparent distress. Cardiovascular - normal rate and rhythm on monitor  except intermittent PACs and PVCs.   Neuro - lethargic but improved from yesterday, eyes open, orientated to people, age and place, not orientated to atime. No aphasia, paucity of speech but able tell me she is feeling better today and thanked me, hypophonia although improved from yesterday, following all simple commands. Able to name and repeat in soft voice. No gaze palsy, no nystagmus, tracking bilaterally, blinking to visual threat bilaterally, PERRL. No facial droop. Tongue midline. LUE 4-/5 and RUE 4/5, BLE 3/5. Sensation symmetrical subjectively, left FTN significant ataxia, right FTN grossly intact, gait not tested.     ASSESSMENT/PLAN [name] is a 89 y.o. female with history of uncontrolled hypertension, breast cancer, syncope, chronic neck pain presenting with presented to the emergency room for sudden onset of headache and just a general sensation of not feeling well upon awakening . CT angiogram revealed occlusion of the left vertebral artery, V2 level onwards with reconstitution proximal to the vertebrobasilar junction. MRI brain revealed left cerebellar stroke with a possible small area of restricted diffusion in the right occipital lobe. IR procedure cancelled due to benefit vs risk stratification.   Acute Left cerebellar Infarct and punctate right occipital infarct, etiology:  occluded left VA secondary to atherosclerosis vs. dissection Code Stroke CT head: No acute findings.   CTA head & neck: Occluded Left V2 segment, appears  new since November and with reconstitution only at the vertebrobasilar junction. This could be secondary to vertebral artery dissection or atherosclerosis occlusion. Advanced Intracranial Atherosclerosis with widespread Moderate And Severe 2nd and 3rd COW branch stenoses. No discrete branch occlusion MRI  Acute infarct in the medial aspect of the left cerebellar hemisphere. Additional region of increased signal on diffusion-weighted imaging in the  medial aspect of the right occipital lobe, favored to represent an additional site of an acute to subacute infarct. Repeat CT 3/1 Expected CT appearance of Left cerebellum PICA infarct.  2D Echo: LVEF 70-75% LDL 122 HgbA1c: 5.6 VTE prophylaxis - lovenox No antithrombotic prior to admission, was on aspirin 81 mg daily and clopidogrel 75 mg daily, but now discontinued given severe anemia and GI bleeding Therapy recommendations:  AIR Disposition:  pending  Lethargy  Leukocytosis Acute blood loss anemia GI bleeding Persistent lethargy, improving Likely from severe anemia and GIB WBC 17.8->13.8->20.4->14.5 Hemoglobin 14.6->11.2->7.8->6.7->PRBC Afebrile UA negative CXR negative Stool occult blood positive CT abd/pelvis -  an area of transmural ulceration versus small duodenal diverticulum. No sign of perforation. GI consulted - plan for EGD in am On protonix IV Hold off antiplatelet for now  Hypertension Home meds:  Toprol-XL '25mg'$ , Norvasc '10mg'$ , Hydralazine '50mg'$ , HCTZ '25mg'$ . Restarted.  Stable on the high end Long-term BP goal normotensive  Hyperlipidemia Home meds:  none LDL 122, goal < 70 Add Lipitor '40mg'$  Continue statin at discharge  Other Stroke Risk Factors Advanced Age >/= 41   Other Active Problems Nausea Zofran, Reglan added PRN Hx of syncope Previous Holter Monitor monitoring 12/2021 for syncope: NSR with episodes of tachycardia up to 120. Toprol-XL '25mg'$  daily added after this. Hx of chronic neck pain Hx of Breast Cancer  Hospital day # 7  Rosalin Hawking, MD PhD Stroke Neurology 05/02/2022 4:18 PM   To contact Stroke Continuity provider, please refer to [email or web address] After hours, contact General Neurology

## 2022-05-02 NOTE — H&P (View-Only) (Signed)
CONSULT NOTE FOR [facility]  Reason for Consult: Anemia, heme positive stool, abnormal CT scan Referring Physician: [facility]  [name] HPI: This is an 89 year old female with a PMH of HTN, breast cancer, and CVA admitted for for a CVA secondary to a left vertebral artery occlusion.  She was also found to have a hypertensive emergency.  Over the course of her hospitalization her neurologic status and her BP stabilize, however, she was noted to have a progressive decline of her HGB.  Her admission HGB was 14.0, but it declined to the nadir of 6.7 g/dL.  A CT scan of the abdomen was obtained as she complained about acute abdominal pain.  The scan showed pyloric and D2 duodenal wall thickening with surrounding soft tissue stranding.  There was a transmural gas collection felt to be from an ulceration or a small duodenal diverticulum.  Perforation was not noted.  Per her report she had an ulcer this past December at [facility], but no records were identified.  Past Medical History:  Diagnosis Date   Actinic keratosis    Breast cancer ([place])    C1 cervical fracture ([place])    Hypertension    Osteoarthritis     Past Surgical History:  Procedure Laterality Date   BACK SURGERY     IR INJECT/THERA/INC NEEDLE/CATH/PLC EPI/LUMB/SAC W/IMG  04/29/2021   MOHS SURGERY      Family History  Problem Relation Age of Onset   Hypertension Father     Social History:  reports that she has never smoked. She has never used smokeless tobacco. She reports current alcohol use. She reports that she does not use drugs.  Allergies:  Allergies  Allergen Reactions   Morphine     Other reaction(s): Other (See Comments) Apnea with large dose Respiratory Distress    Buprenorphine Hcl     Other reaction(s): Other (See Comments), Other (See Comments) Respiratory Distress Respiratory Distress    Codeine    Epinephrine     Other reaction(s): Other (See Comments) Severe anxiety;  "makes my heart race" - would like to avoid during surgery   Morphine And Related Other (See Comments)    Respiratory Distress    Percocet [Oxycodone-Acetaminophen]     Medications: Scheduled:  sodium chloride   Intravenous Once   amLODipine  10 mg Oral Daily   atorvastatin  40 mg Oral Daily   Chlorhexidine Gluconate Cloth  6 each Topical Daily   meclizine  12.5 mg Oral BID   metoCLOPramide (REGLAN) injection  5 mg Intravenous Q12H   metoprolol succinate  25 mg Oral Daily   [START ON 05/05/2022] pantoprazole  40 mg Intravenous Q12H   Continuous:  sodium chloride 75 mL/hr at 05/02/22 1029   pantoprazole 8 mg/hr (05/02/22 1247)    Results for orders placed or performed during the hospital encounter of 04/25/22 (from the past 24 hour(s))  Urinalysis, w/ Reflex to Culture (Infection Suspected) -Urine, Catheterized     Status: None   Collection Time: 05/01/22  4:02 PM  Result Value Ref Range   Specimen Source URINE, CATHETERIZED    Color, Urine YELLOW YELLOW   APPearance CLEAR CLEAR   Specific Gravity, Urine 1.017 1.005 - 1.030   pH 5.0 5.0 - 8.0   Glucose, UA NEGATIVE NEGATIVE mg/dL   Hgb urine dipstick NEGATIVE NEGATIVE   Bilirubin Urine NEGATIVE NEGATIVE   Ketones, ur NEGATIVE NEGATIVE mg/dL   Protein, ur NEGATIVE NEGATIVE mg/dL   Nitrite NEGATIVE  NEGATIVE   Leukocytes,Ua NEGATIVE NEGATIVE   RBC / HPF 0-5 0 - 5 RBC/hpf   WBC, UA 0-5 0 - 5 WBC/hpf   Bacteria, UA NONE SEEN NONE SEEN   Squamous Epithelial / HPF 0-5 0 - 5 /HPF   Mucus PRESENT   Glucose, capillary     Status: Abnormal   Collection Time: 05/01/22  5:31 PM  Result Value Ref Range   Glucose-Capillary 141 (H) 70 - 99 mg/dL  Occult blood card to lab, stool     Status: Abnormal   Collection Time: 05/02/22  5:22 AM  Result Value Ref Range   Fecal Occult Bld POSITIVE (A) NEGATIVE  Basic metabolic panel     Status: Abnormal   Collection Time: 05/02/22  8:58 AM  Result Value Ref Range   Sodium 141 135 - 145 mmol/L    Potassium 3.6 3.5 - 5.1 mmol/L   Chloride 112 (H) 98 - 111 mmol/L   CO2 22 22 - 32 mmol/L   Glucose, Bld 114 (H) 70 - 99 mg/dL   BUN 25 (H) 8 - 23 mg/dL   Creatinine, Ser 0.48 0.44 - 1.00 mg/dL   Calcium 8.4 (L) 8.9 - 10.3 mg/dL   GFR, Estimated >60 >60 mL/min   Anion gap 7 5 - 15  CBC     Status: Abnormal   Collection Time: 05/02/22  8:58 AM  Result Value Ref Range   WBC 14.5 (H) 4.0 - 10.5 K/uL   RBC 2.22 (L) 3.87 - 5.11 MIL/uL   Hemoglobin 6.7 (LL) 12.0 - 15.0 g/dL   HCT 20.9 (L) 36.0 - 46.0 %   MCV 94.1 80.0 - 100.0 fL   MCH 30.2 26.0 - 34.0 pg   MCHC 32.1 30.0 - 36.0 g/dL   RDW 13.9 11.5 - 15.5 %   Platelets 297 150 - 400 K/uL   nRBC 0.3 (H) 0.0 - 0.2 %  Glucose, capillary     Status: Abnormal   Collection Time: 05/02/22  9:26 AM  Result Value Ref Range   Glucose-Capillary 119 (H) 70 - 99 mg/dL   Comment 1 Notify RN    Comment 2 Document in Chart   Type and screen [place]     Status: None (Preliminary result)   Collection Time: 05/02/22 11:58 AM  Result Value Ref Range   ABO/RH(D) A POS    Antibody Screen NEG    Sample Expiration 05/05/2022,2359    Unit Number [id number]    Blood Component Type RED CELLS,LR    Unit division 00    Status of Unit ISSUED    Transfusion Status OK TO TRANSFUSE    Crossmatch Result      Compatible Performed at [facility], [address] [place], [place] [address]    Unit Number [id number]    Blood Component Type RBC LR PHER1    Unit division 00    Status of Unit ALLOCATED    Transfusion Status OK TO TRANSFUSE    Crossmatch Result Compatible   Prepare RBC (crossmatch)     Status: None   Collection Time: 05/02/22 12:01 PM  Result Value Ref Range   Order Confirmation      ORDER PROCESSED BY BLOOD BANK Performed at [facility], [place] [address] [place], [place] [address]   Glucose, capillary     Status: Abnormal   Collection Time: 05/02/22  1:01 PM  Result Value Ref Range    Glucose-Capillary 158 (H)  70 - 99 mg/dL   Comment 1 Notify RN    Comment 2 Document in Chart      CT ABDOMEN PELVIS W CONTRAST  Result Date: 05/02/2022 CLINICAL DATA:  Abdominal pain, acute. Nonlocalized. History of duodenal perforation. EXAM: CT ABDOMEN AND PELVIS WITH CONTRAST TECHNIQUE: Multidetector CT imaging of the abdomen and pelvis was performed using the standard protocol following bolus administration of intravenous contrast. RADIATION DOSE REDUCTION: This exam was performed according to the departmental dose-optimization program which includes automated exposure control, adjustment of the mA and/or kV according to patient size and/or use of iterative reconstruction technique. CONTRAST:  72m OMNIPAQUE IOHEXOL 350 MG/ML SOLN COMPARISON:  05/17/2021 FINDINGS: Lower chest: No acute abnormality. Hepatobiliary: No suspicious liver abnormality. Small cyst within central left liver measures 0.8 cm and is unchanged from previous exam. Lobe of gallbladder appears distended. There are 2 no gallbladder wall thickening. No bile duct dilatation. Stones within the gallbladder measuring up to 2 cm. Pancreas: Cyst in uncinate process measures 8 mm and is unchanged from the previous exam. No main duct dilatation, inflammation or distension. Spleen: Normal in size without focal abnormality. Adrenals/Urinary Tract: Normal adrenal glands. No kidney mass, hydronephrosis, or nephrolithiasis. Urinary bladder is decompressed around a Foley catheter. Stomach/Bowel: There is wall thickening involving the pylorus and descending portion of the duodenum with surrounding soft tissue stranding. Transmural gas collection is identified within the anterior wall of the pylorus measuring 1.2 cm, image 34/3. On the previous exam this measures 1.1 cm. This may represent an area of transmural ulceration versus small duodenal diverticulum. No sign of perforation. The remaining small bowel loops are unremarkable. No pathologic dilatation  of the large or small bowel loops. Sigmoid diverticulosis without acute diverticulitis. Moderate retained stool noted within the colon. Vascular/Lymphatic: Aortic atherosclerosis. No enlarged abdominal or pelvic lymph nodes. Reproductive: Status post hysterectomy. No adnexal masses. Other: No free fluid or focal fluid collections. Musculoskeletal: Lumbar spondylosis and facet arthropathy. No acute or suspicious osseous findings. Postoperative changes from prior lumpectomy noted within the right breast. Soft tissue density nodule within the right lower breast measures 1.7 cm, image 18/3 previously this measured 2.0 cm. IMPRESSION: 1. There is wall thickening involving the pylorus and descending portion of the duodenum with surrounding soft tissue stranding. Transmural gas collection is identified within the anterior wall of the pylorus measuring 1.2 cm. On the previous exam this measures 1.1 cm. This may represent an area of transmural ulceration versus small duodenal diverticulum. No sign of perforation. 2. Gallstones with distended gallbladder. No gallbladder wall thickening. 3. Sigmoid diverticulosis without acute diverticulitis. 4. Moderate retained stool noted within the colon. Correlate for any clinical signs or symptoms of constipation. 5. Stable 8 mm cystic lesion in the uncinate process of the pancreas. Given the patient's age, no further workup or follow-up is recommended. 6. Soft tissue density nodule within the right lower breast measures 1.7 cm previously this measured 2.0 cm. 7.  Aortic Atherosclerosis (ICD10-I70.0). Electronically Signed   By: TKerby MoorsM.D.   On: 05/02/2022 10:39   DG CHEST PORT 1 VIEW  Result Date: 05/01/2022 CLINICAL DATA:  Leukocytosis. EXAM: PORTABLE CHEST 1 VIEW COMPARISON:  Chest radiograph dated 04/25/2022. FINDINGS: No focal consolidation, pleural effusion, pneumothorax. Linear density along the lateral left lung most consistent with a skin fold. Repeat radiograph with  repositioning of the patient may provide better evaluation. The cardiac silhouette is within normal limits. No acute osseous pathology. IMPRESSION: 1. No acute cardiopulmonary process. 2. Probable  skin fold along the lateral left lung. Electronically Signed   By: Anner Crete M.D.   On: 05/01/2022 17:52    ROS:  As stated above in the HPI otherwise negative.  Blood pressure (!) 156/53, pulse 76, temperature 97.7 F (36.5 C), resp. rate 18, weight 59.6 kg, SpO2 100 %.    PE: Gen: NAD, Alert and Oriented HEENT:  /AT, EOMI Neck: Supple, no LAD Lungs: CTA Bilaterally CV: RRR without M/G/R ABD: Soft, tender in the epigastrium, +BS Ext: No C/C/E  Assessment/Plan: 1) Anemia. 2) Heme positive stool. 3) Possible upper GI tract ulcer.  Plan: 1) EGD with Dr. Havery Moros tomorrow. 2) Continue with pantoprazole. 3) Follow HGB and transfuse as necessary.  Ismael Karge D 05/02/2022, 2:51 PM

## 2022-05-02 NOTE — Consult Note (Signed)
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  She was also found to have a hypertensive emergency.  Over the course of her hospitalization her neurologic status and her BP stabilize, however, she was noted to have a progressive decline of her HGB.  Her admission HGB was 14.0, but it declined to the nadir of 6.7 g/dL.  A CT scan of the abdomen was obtained as she complained about acute abdominal pain.  The scan showed pyloric and D2 duodenal wall thickening with surrounding soft tissue stranding.  There was a transmural gas collection felt to be from an ulceration or a small duodenal diverticulum.  Perforation was not noted.  Per her report she had an ulcer this past December at [facility], but no records were identified.  Past Medical History:  Diagnosis Date   Actinic keratosis    Breast cancer ([place])    C1 cervical fracture ([place])    Hypertension    Osteoarthritis     Past Surgical History:  Procedure Laterality Date   BACK SURGERY     IR INJECT/THERA/INC NEEDLE/CATH/PLC EPI/LUMB/SAC W/IMG  04/29/2021   MOHS SURGERY      Family History  Problem Relation Age of Onset   Hypertension Father     Social History:  reports that she has never smoked. She has never used smokeless tobacco. She reports current alcohol use. She reports that she does not use drugs.  Allergies:  Allergies  Allergen Reactions   Morphine     Other reaction(s): Other (See Comments) Apnea with large dose Respiratory Distress    Buprenorphine Hcl     Other reaction(s): Other (See Comments), Other (See Comments) Respiratory Distress Respiratory Distress    Codeine    Epinephrine     Other reaction(s): Other (See Comments) Severe anxiety;  "makes my heart race" - would like to avoid during surgery   Morphine And Related Other (See Comments)    Respiratory Distress    Percocet [Oxycodone-Acetaminophen]     Medications: Scheduled:  sodium chloride   Intravenous Once   amLODipine  10 mg Oral Daily   atorvastatin  40 mg Oral Daily   Chlorhexidine Gluconate Cloth  6 each Topical Daily   meclizine  12.5 mg Oral BID   metoCLOPramide (REGLAN) injection  5 mg Intravenous Q12H   metoprolol succinate  25 mg Oral Daily   [START ON 05/05/2022] pantoprazole  40 mg Intravenous Q12H   Continuous:  sodium chloride 75 mL/hr at 05/02/22 1029   pantoprazole 8 mg/hr (05/02/22 1247)    Results for orders placed or performed during the hospital encounter of 04/25/22 (from the past 24 hour(s))  Urinalysis, w/ Reflex to Culture (Infection Suspected) -Urine, Catheterized     Status: None   Collection Time: 05/01/22  4:02 PM  Result Value Ref Range   Specimen Source URINE, CATHETERIZED    Color, Urine YELLOW YELLOW   APPearance CLEAR CLEAR   Specific Gravity, Urine 1.017 1.005 - 1.030   pH 5.0 5.0 - 8.0   Glucose, UA NEGATIVE NEGATIVE mg/dL   Hgb urine dipstick NEGATIVE NEGATIVE   Bilirubin Urine NEGATIVE NEGATIVE   Ketones, ur NEGATIVE NEGATIVE mg/dL   Protein, ur NEGATIVE NEGATIVE mg/dL   Nitrite NEGATIVE  NEGATIVE   Leukocytes,Ua NEGATIVE NEGATIVE   RBC / HPF 0-5 0 - 5 RBC/hpf   WBC, UA 0-5 0 - 5 WBC/hpf   Bacteria, UA NONE SEEN NONE SEEN   Squamous Epithelial / HPF 0-5 0 - 5 /HPF   Mucus PRESENT   Glucose, capillary     Status: Abnormal   Collection Time: 05/01/22  5:31 PM  Result Value Ref Range   Glucose-Capillary 141 (H) 70 - 99 mg/dL  Occult blood card to lab, stool     Status: Abnormal   Collection Time: 05/02/22  5:22 AM  Result Value Ref Range   Fecal Occult Bld POSITIVE (A) NEGATIVE  Basic metabolic panel     Status: Abnormal   Collection Time: 05/02/22  8:58 AM  Result Value Ref Range   Sodium 141 135 - 145 mmol/L    Potassium 3.6 3.5 - 5.1 mmol/L   Chloride 112 (H) 98 - 111 mmol/L   CO2 22 22 - 32 mmol/L   Glucose, Bld 114 (H) 70 - 99 mg/dL   BUN 25 (H) 8 - 23 mg/dL   Creatinine, Ser 0.48 0.44 - 1.00 mg/dL   Calcium 8.4 (L) 8.9 - 10.3 mg/dL   GFR, Estimated >60 >60 mL/min   Anion gap 7 5 - 15  CBC     Status: Abnormal   Collection Time: 05/02/22  8:58 AM  Result Value Ref Range   WBC 14.5 (H) 4.0 - 10.5 K/uL   RBC 2.22 (L) 3.87 - 5.11 MIL/uL   Hemoglobin 6.7 (LL) 12.0 - 15.0 g/dL   HCT 20.9 (L) 36.0 - 46.0 %   MCV 94.1 80.0 - 100.0 fL   MCH 30.2 26.0 - 34.0 pg   MCHC 32.1 30.0 - 36.0 g/dL   RDW 13.9 11.5 - 15.5 %   Platelets 297 150 - 400 K/uL   nRBC 0.3 (H) 0.0 - 0.2 %  Glucose, capillary     Status: Abnormal   Collection Time: 05/02/22  9:26 AM  Result Value Ref Range   Glucose-Capillary 119 (H) 70 - 99 mg/dL   Comment 1 Notify RN    Comment 2 Document in Chart   Type and screen [facility]     Status: None (Preliminary result)   Collection Time: 05/02/22 11:58 AM  Result Value Ref Range   ABO/RH(D) A POS    Antibody Screen NEG    Sample Expiration 05/05/2022,2359    Unit Number [id number]    Blood Component Type RED CELLS,LR    Unit division 00    Status of Unit ISSUED    Transfusion Status OK TO TRANSFUSE    Crossmatch Result      Compatible Performed at [facility], [address], [place], [place] [address]    Unit Number [id number]    Blood Component Type RBC LR PHER1    Unit division 00    Status of Unit ALLOCATED    Transfusion Status OK TO TRANSFUSE    Crossmatch Result Compatible   Prepare RBC (crossmatch)     Status: None   Collection Time: 05/02/22 12:01 PM  Result Value Ref Range   Order Confirmation      ORDER PROCESSED BY BLOOD BANK Performed at [facility], [place] [address] [place], [place] [address]   Glucose, capillary     Status: Abnormal   Collection Time: 05/02/22  1:01 PM  Result Value Ref Range    Glucose-Capillary 158 (H)  70 - 99 mg/dL   Comment 1 Notify RN    Comment 2 Document in Chart      CT ABDOMEN PELVIS W CONTRAST  Result Date: 05/02/2022 CLINICAL DATA:  Abdominal pain, acute. Nonlocalized. History of duodenal perforation. EXAM: CT ABDOMEN AND PELVIS WITH CONTRAST TECHNIQUE: Multidetector CT imaging of the abdomen and pelvis was performed using the standard protocol following bolus administration of intravenous contrast. RADIATION DOSE REDUCTION: This exam was performed according to the departmental dose-optimization program which includes automated exposure control, adjustment of the mA and/or kV according to patient size and/or use of iterative reconstruction technique. CONTRAST:  36m OMNIPAQUE IOHEXOL 350 MG/ML SOLN COMPARISON:  05/17/2021 FINDINGS: Lower chest: No acute abnormality. Hepatobiliary: No suspicious liver abnormality. Small cyst within central left liver measures 0.8 cm and is unchanged from previous exam. Lobe of gallbladder appears distended. There are 2 no gallbladder wall thickening. No bile duct dilatation. Stones within the gallbladder measuring up to 2 cm. Pancreas: Cyst in uncinate process measures 8 mm and is unchanged from the previous exam. No main duct dilatation, inflammation or distension. Spleen: Normal in size without focal abnormality. Adrenals/Urinary Tract: Normal adrenal glands. No kidney mass, hydronephrosis, or nephrolithiasis. Urinary bladder is decompressed around a Foley catheter. Stomach/Bowel: There is wall thickening involving the pylorus and descending portion of the duodenum with surrounding soft tissue stranding. Transmural gas collection is identified within the anterior wall of the pylorus measuring 1.2 cm, image 34/3. On the previous exam this measures 1.1 cm. This may represent an area of transmural ulceration versus small duodenal diverticulum. No sign of perforation. The remaining small bowel loops are unremarkable. No pathologic dilatation  of the large or small bowel loops. Sigmoid diverticulosis without acute diverticulitis. Moderate retained stool noted within the colon. Vascular/Lymphatic: Aortic atherosclerosis. No enlarged abdominal or pelvic lymph nodes. Reproductive: Status post hysterectomy. No adnexal masses. Other: No free fluid or focal fluid collections. Musculoskeletal: Lumbar spondylosis and facet arthropathy. No acute or suspicious osseous findings. Postoperative changes from prior lumpectomy noted within the right breast. Soft tissue density nodule within the right lower breast measures 1.7 cm, image 18/3 previously this measured 2.0 cm. IMPRESSION: 1. There is wall thickening involving the pylorus and descending portion of the duodenum with surrounding soft tissue stranding. Transmural gas collection is identified within the anterior wall of the pylorus measuring 1.2 cm. On the previous exam this measures 1.1 cm. This may represent an area of transmural ulceration versus small duodenal diverticulum. No sign of perforation. 2. Gallstones with distended gallbladder. No gallbladder wall thickening. 3. Sigmoid diverticulosis without acute diverticulitis. 4. Moderate retained stool noted within the colon. Correlate for any clinical signs or symptoms of constipation. 5. Stable 8 mm cystic lesion in the uncinate process of the pancreas. Given the patient's age, no further workup or follow-up is recommended. 6. Soft tissue density nodule within the right lower breast measures 1.7 cm previously this measured 2.0 cm. 7.  Aortic Atherosclerosis (ICD10-I70.0). Electronically Signed   By: TKerby MoorsM.D.   On: 05/02/2022 10:39   DG CHEST PORT 1 VIEW  Result Date: 05/01/2022 CLINICAL DATA:  Leukocytosis. EXAM: PORTABLE CHEST 1 VIEW COMPARISON:  Chest radiograph dated 04/25/2022. FINDINGS: No focal consolidation, pleural effusion, pneumothorax. Linear density along the lateral left lung most consistent with a skin fold. Repeat radiograph with  repositioning of the patient may provide better evaluation. The cardiac silhouette is within normal limits. No acute osseous pathology. IMPRESSION: 1. No acute cardiopulmonary process. 2. Probable  skin fold along the lateral left lung. Electronically Signed   By: Anner Crete M.D.   On: 05/01/2022 17:52    ROS:  As stated above in the HPI otherwise negative.  Blood pressure (!) 156/53, pulse 76, temperature 97.7 F (36.5 C), resp. rate 18, weight 59.6 kg, SpO2 100 %.    PE: Gen: NAD, Alert and Oriented HEENT:  [place]/AT, EOMI Neck: Supple, no LAD Lungs: CTA Bilaterally CV: RRR without M/G/R ABD: Soft, tender in the epigastrium, +BS Ext: No C/C/E  Assessment/Plan: 1) Anemia. 2) Heme positive stool. 3) Possible upper GI tract ulcer.  Plan: 1) EGD with Dr. Havery Moros tomorrow. 2) Continue with pantoprazole. 3) Follow HGB and transfuse as necessary.  Luismario Coston D 05/02/2022, 2:51 PM

## 2022-05-02 NOTE — Progress Notes (Signed)
PROGRESS NOTE    Sherry [name]  [id number] DOB: 06/17/1930 DOA: 04/25/2022 PCP: Rusty Aus, MD   Brief Narrative: Sherry [name] is a 89 y.o. female with a history of hypertension, recurrent falls, breast cancer status postlumpectomy and radiation.  Patient presented secondary to headache, nausea, vomiting with associated dizziness.  MRI imaging confirmed presence of acute cerebellar infarct and CT angiogram of the head and neck was significant for left vertebral artery occlusion.  Patient was admitted for hypertensive emergency and acute stroke, initially requiring ICU admission and Cleviprex drip.  For antihypertensives restarted and Cleviprex weaned off.  Patient with intractable vertigo/nausea secondary to acute infarct.  Physical therapy recommending acute inpatient rehab on discharge, however no plan for discharge to SNF.   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 MRI confirms acute infarct of medial aspect of left cerebellar hemisphere in addition to likely acute/subacute infarct of the medial aspect of the right occipital lobe. CTA head and neck significant for an occluded left vertebral artery in the V2 segment. LDL of 122. Hemoglobin A1C of 5.6%. Transthoracic Echocardiogram significant for no atrial level shunt. Neurology recommendations for aspirin indefinitely and Plavix 75 mg for three months. PT/OT recommendations for acute inpatient rehabilitation, now with plan to discharge to SNF.  Hypertensive emergency Per history, patient's outpatient antihypertensives discontinued in December. Patient managed initially on Cleviprex and able to wean off. -Continue amlodipine 10 mg daily and metoprolol -Hold hydralazine while active GI bleeding present  Acute GI bleeding Patient with acute blood loss anemia with FOBT positive stool. Associated elevated BUN. GI consulted. CT abdomen/pelvis obtained  and suggests possible ulceration of the pylorus/descending portion of duodenum -GI recommendations pending -Protonix drip  Acute blood loss anemia Secondary to GI bleeding. Hemoglobin of 14.6 on admission. Hemoglobin down to 6.7 today. Patient consented for blood transfusion. -2 units of PRBC -Daily CBC/H&H q12 hours while actively  bleeding  Abdominal pain Unclear etiology but concern this is likely related to GI bleeding and anemia. Stat CT abdomen/pelvis obtained and confirmed concern for gastric/duodenal ulcer without evidence of perforation. GI consulted for GI bleeding.  Leukocytosis No source for infection noted; urinalysis not suggestive of infection. Chest x-ray without cardiopulmonary disease. Initial leukocytosis and downtrend, now with elevation with peak of 20,400 predominantly secondary to neutrophilia. WBC down to 14,500 today.  Acute urinary retention Unclear etiology. Possibly related to patient essentially being bed-bound. Foley catheter placed on 3/2 secondary to severe urinary retention. -Continue urinary catheter for 5-7 days prior to voiding trial, unless mobility significant improves  Lactic acidosis Present on admission. Improved with IV fluids.  Intractable nausea/vomiting Intractable vertigo Secondary to acute stroke. Therapies have not been successful in treating symptoms. -Continue Reglan scheduled and meclizine scheduled  Right breast nodule/mass 19 mm mixed density nodule/mass noted on CT chest. Patient with a history of right breast cancer s/p lumpectomy. Patient will need follow-up for nodule with mammography.   DVT prophylaxis: SCDs Code Status:   Code Status: Full Code Family Communication: None at bedside. Husband via telephone Disposition Plan: Discharge to SNF when bed available. Pending management of active GI bleeding.   Consultants:  PCCM Neurology  Procedures:  2/26: Transthoracic  Echocardiogram  Antimicrobials: None   Subjective: Patient remains with symptoms of dizziness and nausea. Patient reports receiving a digital rectal procedure to test for GI bleed. She has not noticed bleeding herself.  Objective: BP (!) 158/53 (BP Location: Left  Arm)   Pulse 78   Temp (!) 97.5 F (36.4 C) (Oral)   Resp 17   Wt 59.6 kg   SpO2 100%   BMI 24.03 kg/m   Examination:  General exam: Appears calm and comfortable. Frail appearing. Pale. Respiratory system: Clear to auscultation. Respiratory effort normal. Cardiovascular system: S1 & S2 heard, RRR. No murmurs, rubs, gallops or clicks. Gastrointestinal system: Abdomen is nondistended, soft and tender in epigastrium and LLQ. No organomegaly or masses felt. Normal bowel sounds heard. Central nervous system: Alert and oriented. No focal neurological deficits. Musculoskeletal: No edema. No calf tenderness Skin: No cyanosis. Multiple areas of ecchymosis noted on extremities Psychiatry: Flat affect   Data Reviewed: I have personally reviewed following labs and imaging studies  CBC Lab Results  Component Value Date   WBC 14.5 (H) 05/02/2022   RBC 2.22 (L) 05/02/2022   HGB 6.7 (LL) 05/02/2022   HCT 20.9 (L) 05/02/2022   MCV 94.1 05/02/2022   MCH 30.2 05/02/2022   PLT 297 05/02/2022   MCHC 32.1 05/02/2022   RDW 13.9 05/02/2022   LYMPHSABS 4.8 (H) 05/01/2022   MONOABS 1.4 (H) 05/01/2022   EOSABS 0.1 05/01/2022   BASOSABS 0.1 [id number]     Last metabolic panel Lab Results  Component Value Date   NA 141 05/02/2022   K 3.6 05/02/2022   CL 112 (H) 05/02/2022   CO2 22 05/02/2022   BUN 25 (H) 05/02/2022   CREATININE 0.48 05/02/2022   GLUCOSE 114 (H) 05/02/2022   GFRNONAA >60 05/02/2022   GFRAA >60 08/15/2017   CALCIUM 8.4 (L) 05/02/2022   PHOS 3.0 04/26/2022   PROT 7.0 04/25/2022   ALBUMIN 4.1 04/25/2022   BILITOT 0.9 04/25/2022   ALKPHOS 43 04/25/2022   AST 26 04/25/2022   ALT 23 04/25/2022   ANIONGAP  7 05/02/2022    GFR: Estimated Creatinine Clearance: 37.7 mL/min (by C-G formula based on SCr of 0.48 mg/dL).  Recent Results (from the past 240 hour(s))  MRSA Next Gen by PCR, Nasal     Status: None   Collection Time: 04/25/22  5:23 PM   Specimen: Nasal Mucosa; Nasal Swab  Result Value Ref Range Status   MRSA by PCR Next Gen NOT DETECTED NOT DETECTED Final    Comment: (NOTE) The GeneXpert MRSA Assay (FDA approved for NASAL specimens only), is one component of a comprehensive MRSA colonization surveillance program. It is not intended to diagnose MRSA infection nor to guide or monitor treatment for MRSA infections. Test performance is not FDA approved in patients less than 22 years old. Performed at [facility], [place] [address], [place], [place] [address]   Culture, blood (Routine X 2) w Reflex to ID Panel     Status: None   Collection Time: 04/26/22  8:18 AM   Specimen: BLOOD RIGHT HAND  Result Value Ref Range Status   Specimen Description BLOOD RIGHT HAND  Final   Special Requests   Final    BOTTLES DRAWN AEROBIC AND ANAEROBIC Blood Culture adequate volume   Culture   Final    NO GROWTH 5 DAYS Performed at [facility], [place] [address], [place], [place] [address]    Report Status 05/01/2022 FINAL  Final  Culture, blood (Routine X 2) w Reflex to ID Panel     Status: None   Collection Time: 04/26/22  8:23 AM   Specimen: BLOOD RIGHT HAND  Result Value Ref Range Status   Specimen Description BLOOD RIGHT  HAND  Final   Special Requests   Final    BOTTLES DRAWN AEROBIC AND ANAEROBIC Blood Culture adequate volume   Culture   Final    NO GROWTH 5 DAYS Performed at [facility], [address] [place], [place] [address]    Report Status 05/01/2022 FINAL  Final      Radiology Studies: CT ABDOMEN PELVIS W CONTRAST  Result Date: 05/02/2022 CLINICAL DATA:  Abdominal pain, acute. Nonlocalized. History of duodenal perforation. EXAM: CT ABDOMEN AND PELVIS WITH  CONTRAST TECHNIQUE: Multidetector CT imaging of the abdomen and pelvis was performed using the standard protocol following bolus administration of intravenous contrast. RADIATION DOSE REDUCTION: This exam was performed according to the departmental dose-optimization program which includes automated exposure control, adjustment of the mA and/or kV according to patient size and/or use of iterative reconstruction technique. CONTRAST:  66m OMNIPAQUE IOHEXOL 350 MG/ML SOLN COMPARISON:  05/17/2021 FINDINGS: Lower chest: No acute abnormality. Hepatobiliary: No suspicious liver abnormality. Small cyst within central left liver measures 0.8 cm and is unchanged from previous exam. Lobe of gallbladder appears distended. There are 2 no gallbladder wall thickening. No bile duct dilatation. Stones within the gallbladder measuring up to 2 cm. Pancreas: Cyst in uncinate process measures 8 mm and is unchanged from the previous exam. No main duct dilatation, inflammation or distension. Spleen: Normal in size without focal abnormality. Adrenals/Urinary Tract: Normal adrenal glands. No kidney mass, hydronephrosis, or nephrolithiasis. Urinary bladder is decompressed around a Foley catheter. Stomach/Bowel: There is wall thickening involving the pylorus and descending portion of the duodenum with surrounding soft tissue stranding. Transmural gas collection is identified within the anterior wall of the pylorus measuring 1.2 cm, image 34/3. On the previous exam this measures 1.1 cm. This may represent an area of transmural ulceration versus small duodenal diverticulum. No sign of perforation. The remaining small bowel loops are unremarkable. No pathologic dilatation of the large or small bowel loops. Sigmoid diverticulosis without acute diverticulitis. Moderate retained stool noted within the colon. Vascular/Lymphatic: Aortic atherosclerosis. No enlarged abdominal or pelvic lymph nodes. Reproductive: Status post hysterectomy. No adnexal  masses. Other: No free fluid or focal fluid collections. Musculoskeletal: Lumbar spondylosis and facet arthropathy. No acute or suspicious osseous findings. Postoperative changes from prior lumpectomy noted within the right breast. Soft tissue density nodule within the right lower breast measures 1.7 cm, image 18/3 previously this measured 2.0 cm. IMPRESSION: 1. There is wall thickening involving the pylorus and descending portion of the duodenum with surrounding soft tissue stranding. Transmural gas collection is identified within the anterior wall of the pylorus measuring 1.2 cm. On the previous exam this measures 1.1 cm. This may represent an area of transmural ulceration versus small duodenal diverticulum. No sign of perforation. 2. Gallstones with distended gallbladder. No gallbladder wall thickening. 3. Sigmoid diverticulosis without acute diverticulitis. 4. Moderate retained stool noted within the colon. Correlate for any clinical signs or symptoms of constipation. 5. Stable 8 mm cystic lesion in the uncinate process of the pancreas. Given the patient's age, no further workup or follow-up is recommended. 6. Soft tissue density nodule within the right lower breast measures 1.7 cm previously this measured 2.0 cm. 7.  Aortic Atherosclerosis (ICD10-I70.0). Electronically Signed   By: TKerby MoorsM.D.   On: 05/02/2022 10:39   DG CHEST PORT 1 VIEW  Result Date: 05/01/2022 CLINICAL DATA:  Leukocytosis. EXAM: PORTABLE CHEST 1 VIEW COMPARISON:  Chest radiograph dated 04/25/2022. FINDINGS: No focal consolidation, pleural effusion, pneumothorax. Linear density  along the lateral left lung most consistent with a skin fold. Repeat radiograph with repositioning of the patient may provide better evaluation. The cardiac silhouette is within normal limits. No acute osseous pathology. IMPRESSION: 1. No acute cardiopulmonary process. 2. Probable skin fold along the lateral left lung. Electronically Signed   By: Anner Crete M.D.   On: 05/01/2022 17:52   CT HEAD WO CONTRAST (5MM)  Result Date: 04/30/2022 CLINICAL DATA:  Stroke follow-up EXAM: CT HEAD WITHOUT CONTRAST TECHNIQUE: Contiguous axial images were obtained from the base of the skull through the vertex without intravenous contrast. RADIATION DOSE REDUCTION: This exam was performed according to the departmental dose-optimization program which includes automated exposure control, adjustment of the mA and/or kV according to patient size and/or use of iterative reconstruction technique. COMPARISON:  CT Head 04/26/22, MRI Head 04/25/22 FINDINGS: Brain: Evolving left cerebellar infarct. No evidence of hemorrhage. No CT evidence of a new infarct. Redemonstrated is a subacute right occipital lobe infarct and a chronic right frontal lobe infarct. There is also a chronic infarct in the left caudate head. Sequela of moderate chronic microvascular ischemic change. No hydrocephalus. No extra-axial fluid collection. Vascular: No hyperdense vessel or unexpected calcification. Skull: Normal. Negative for fracture or focal lesion. Sinuses/Orbits: No mastoid or middle ear effusion. Frothy secretions in the left sphenoid sinus in the posterior right ethmoid air cells, which can be seen in the setting of acute sinusitis. Bilateral lens replacement. Orbits are otherwise unremarkable. Other: None. IMPRESSION: Evolving left cerebellar infarct. No evidence of hemorrhage. No CT evidence of a new infarct. No hydrocephalus. Electronically Signed   By: Marin Roberts M.D.   On: 04/30/2022 12:38      LOS: 7 days    Cordelia Poche, MD [facility] 05/02/2022, 10:46 AM   If 7PM-7AM, please contact night-coverage [email or web address]

## 2022-05-03 ENCOUNTER — Inpatient Hospital Stay (HOSPITAL_COMMUNITY): Payer: Medicare Other | Admitting: Certified Registered"

## 2022-05-03 ENCOUNTER — Encounter (HOSPITAL_COMMUNITY): Payer: Self-pay | Admitting: Pulmonary Disease

## 2022-05-03 ENCOUNTER — Encounter (HOSPITAL_COMMUNITY)
Admission: EM | Disposition: A | Discharge status: Skilled Nursing Facility | Payer: Self-pay | Source: Other Acute Inpatient Hospital | Attending: Internal Medicine

## 2022-05-03 DIAGNOSIS — K264 Chronic or unspecified duodenal ulcer with hemorrhage: Secondary | ICD-10-CM | POA: Diagnosis not present

## 2022-05-03 DIAGNOSIS — I639 Cerebral infarction, unspecified: Secondary | ICD-10-CM | POA: Diagnosis not present

## 2022-05-03 DIAGNOSIS — I10 Essential (primary) hypertension: Secondary | ICD-10-CM

## 2022-05-03 DIAGNOSIS — I63212 Cerebral infarction due to unspecified occlusion or stenosis of left vertebral arteries: Secondary | ICD-10-CM | POA: Diagnosis not present

## 2022-05-03 DIAGNOSIS — K2210 Ulcer of esophagus without bleeding: Secondary | ICD-10-CM

## 2022-05-03 DIAGNOSIS — K259 Gastric ulcer, unspecified as acute or chronic, without hemorrhage or perforation: Secondary | ICD-10-CM

## 2022-05-03 DIAGNOSIS — K279 Peptic ulcer, site unspecified, unspecified as acute or chronic, without hemorrhage or perforation: Secondary | ICD-10-CM

## 2022-05-03 DIAGNOSIS — D62 Acute posthemorrhagic anemia: Secondary | ICD-10-CM | POA: Diagnosis not present

## 2022-05-03 DIAGNOSIS — F419 Anxiety disorder, unspecified: Secondary | ICD-10-CM

## 2022-05-03 DIAGNOSIS — K269 Duodenal ulcer, unspecified as acute or chronic, without hemorrhage or perforation: Secondary | ICD-10-CM | POA: Insufficient documentation

## 2022-05-03 DIAGNOSIS — K449 Diaphragmatic hernia without obstruction or gangrene: Secondary | ICD-10-CM

## 2022-05-03 DIAGNOSIS — K317 Polyp of stomach and duodenum: Secondary | ICD-10-CM

## 2022-05-03 DIAGNOSIS — D649 Anemia, unspecified: Secondary | ICD-10-CM

## 2022-05-03 HISTORY — PX: BIOPSY: SHX5522

## 2022-05-03 HISTORY — PX: HEMOSTASIS CONTROL: SHX6838

## 2022-05-03 HISTORY — PX: ESOPHAGOGASTRODUODENOSCOPY (EGD) WITH PROPOFOL: SHX5813

## 2022-05-03 HISTORY — DX: Peptic ulcer, site unspecified, unspecified as acute or chronic, without hemorrhage or perforation: K27.9

## 2022-05-03 LAB — BASIC METABOLIC PANEL
Anion gap: 8 (ref 5–15)
BUN: 14 mg/dL (ref 8–23)
CO2: 21 mmol/L — ABNORMAL LOW (ref 22–32)
Calcium: 7.9 mg/dL — ABNORMAL LOW (ref 8.9–10.3)
Chloride: 108 mmol/L (ref 98–111)
Creatinine, Ser: 0.43 mg/dL — ABNORMAL LOW (ref 0.44–1.00)
GFR, Estimated: >60 mL/min (ref >60)
Glucose, Bld: 103 mg/dL — ABNORMAL HIGH (ref 70–99)
Potassium: 3 mmol/L — ABNORMAL LOW (ref 3.5–5.1)
Sodium: 137 mmol/L (ref 135–145)

## 2022-05-03 LAB — CBC
HCT: 29 % — ABNORMAL LOW (ref 36.0–46.0)
Hemoglobin: 10.2 g/dL — ABNORMAL LOW (ref 12.0–15.0)
MCH: 31.7 pg (ref 26.0–34.0)
MCHC: 35.2 g/dL (ref 30.0–36.0)
MCV: 90.1 fL (ref 80.0–100.0)
Platelets: 261 10*3/uL (ref 150–400)
RBC: 3.22 MIL/uL — ABNORMAL LOW (ref 3.87–5.11)
RDW: 14.4 % (ref 11.5–15.5)
WBC: 14 10*3/uL — ABNORMAL HIGH (ref 4.0–10.5)
nRBC: 0.2 % (ref 0.0–0.2)

## 2022-05-03 LAB — TYPE AND SCREEN
ABO/RH(D): A POS
Antibody Screen: NEGATIVE
Unit division: 0
Unit division: 0

## 2022-05-03 LAB — GLUCOSE, CAPILLARY
Glucose-Capillary: 96 mg/dL (ref 70–99)
Glucose-Capillary: 107 mg/dL — ABNORMAL HIGH (ref 70–99)
Glucose-Capillary: 120 mg/dL — ABNORMAL HIGH (ref 70–99)
Glucose-Capillary: 100 mg/dL — ABNORMAL HIGH (ref 70–99)

## 2022-05-03 LAB — BPAM RBC
Blood Product Expiration Date: 202403262359
Blood Product Expiration Date: 202403272359
ISSUE DATE / TIME: 202403031328
ISSUE DATE / TIME: 202403031635
Unit Type and Rh: 6200
Unit Type and Rh: 6200

## 2022-05-03 SURGERY — ESOPHAGOGASTRODUODENOSCOPY (EGD) WITH PROPOFOL
Anesthesia: Monitor Anesthesia Care

## 2022-05-03 MED ORDER — POTASSIUM CHLORIDE 10 MEQ/100ML IV SOLN
10.0000 meq | INTRAVENOUS | Status: AC
  Administered 2022-05-03 (×4): 10 meq via INTRAVENOUS
  Filled 2022-05-03 (×4): qty 100

## 2022-05-03 MED ORDER — LIDOCAINE 2% (20 MG/ML) 5 ML SYRINGE
INTRAMUSCULAR | Status: DC | PRN
  Administered 2022-05-03: 60 mg via INTRAVENOUS

## 2022-05-03 MED ORDER — PROPOFOL 500 MG/50ML IV EMUL
INTRAVENOUS | Status: DC | PRN
  Administered 2022-05-03: 100 ug/kg/min via INTRAVENOUS

## 2022-05-03 MED ORDER — LACTATED RINGERS IV SOLN: INTRAVENOUS | Status: DC

## 2022-05-03 MED ORDER — PROPOFOL 10 MG/ML IV BOLUS
INTRAVENOUS | Status: DC | PRN
  Administered 2022-05-03: 30 mg via INTRAVENOUS

## 2022-05-03 MED ORDER — ASPIRIN 81 MG PO TBEC
81.0000 mg | DELAYED_RELEASE_TABLET | Freq: Every day | ORAL | Status: DC
  Administered 2022-05-04 – 2022-05-12 (×9): 81 mg via ORAL
  Filled 2022-05-03 (×9): qty 1

## 2022-05-03 MED ORDER — ONDANSETRON HCL 4 MG/2ML IJ SOLN
4.0000 mg | Freq: Four times a day (QID) | INTRAMUSCULAR | Status: DC | PRN
  Administered 2022-05-03 – 2022-05-08 (×3): 4 mg via INTRAVENOUS
  Filled 2022-05-03 (×3): qty 2

## 2022-05-03 SURGICAL SUPPLY — 15 items

## 2022-05-03 NOTE — Progress Notes (Signed)
OT Cancellation Note  Patient Details Name: Sherry [name] MRN: [id number] DOB: 1930-11-02   Cancelled Treatment:    Reason Eval/Treat Not Completed: Patient at procedure or test/ unavailable.   Jolaine Artist, OT Acute Rehabilitation Services Office [place] 05/03/2022, 8:20 AM

## 2022-05-03 NOTE — Op Note (Signed)
[facility] Patient Name: Sherry [name] Procedure Date : 05/03/2022 MRN: [id number] Attending MD: Carlota Raspberry. Havery Moros , MD, [id number] Date of Birth: January 06, 1931 CSN: [id number] Age: 89 Admit Type: Inpatient Procedure:                Upper GI endoscopy Indications:              Upper abdominal pain, Suspected upper                            gastrointestinal bleeding - recent CVA on aspirin                            and plavix - downtrending Hgb with elevated BUN.                            Responded to transfusion. CT suggests duodenal ulcer Providers:                Remo Lipps P. Havery Moros, MD, Dulcy Fanny, Benetta Spar, Technician Referring MD:             Nathaniel Man Medicines:                Monitored Anesthesia Care Complications:            No immediate complications. Estimated blood loss:                            Minimal. Estimated Blood Loss:     Estimated blood loss was minimal. Procedure:                Pre-Anesthesia Assessment:                           - Prior to the procedure, a History and Physical                            was performed, and patient medications and                            allergies were reviewed. The patient's tolerance of                            previous anesthesia was also reviewed. The risks                            and benefits of the procedure and the sedation                            options and risks were discussed with the patient.                            All questions were answered, and informed consent  was obtained. Prior Anticoagulants: The patient has                            taken Plavix (clopidogrel), last dose was 2 days                            prior to procedure. ASA Grade Assessment: III - A                            patient with severe systemic disease. After                            reviewing the risks and benefits, the patient was                             deemed in satisfactory condition to undergo the                            procedure.                           After obtaining informed consent, the endoscope was                            passed under direct vision. Throughout the                            procedure, the patient's blood pressure, pulse, and                            oxygen saturations were monitored continuously. The                            [id number] Olympus endoscope was introduced                            through the mouth, and advanced to the second part                            of duodenum. The upper GI endoscopy was                            accomplished without difficulty. The patient                            tolerated the procedure well. Scope In: Scope Out: Findings:      Esophagogastric landmarks were identified: the Z-line was found at 36       cm, the gastroesophageal junction was found at 36 cm and the upper       extent of the gastric folds was found at 38 cm from the incisors.      A 2 cm hiatal hernia was present.      Three superficial esophageal ulcers with no stigmata of recent bleeding       were found  36 cm from the incisors. The largest lesion was 5 mm in       largest dimension. All clean based.      The exam of the esophagus was otherwise normal.      Three non-bleeding cratered gastric ulcers with a clean ulcer base       (Forrest Class III) were found in the prepyloric region of the stomach.       The largest lesion was 5 mm in largest dimension, smallest was 38m or so.      A few small sessile polyps were found in the gastric body.      The exam of the stomach was otherwise normal.      Biopsies were taken with a cold forceps for Helicobacter pylori testing.      Eight non-bleeding cratered duodenal ulcers were found in the duodenal       bulb. Most small - 2-537min size. The largest lesion was roughly 6 mm in       largest dimension, this had  a red spot suggestive of a small vessel. The       other ulcers mostly clean based, one in the proximal bulb had some old       pigmented material. No heme noted anywhere in the duodenum. Initially, a       Duraclip / hemostasis clip was attempted to be placed across the largest       duodenal ulcer with the red spot, but due to nature of location and how       cratered it was, it would not adhere to close across the ulcer and       attempts to do so caused mucosal trauma and oozing at the site. The       location was not good for clip placement and was quite deep, concerned       about risks for treating with Goldprobe. I elected to apply Purastat to       the area and it filled the ulcer with good application. No bleeding       noted after this intervention.      The exam of the duodenum was otherwise normal. Impression:               - Esophagogastric landmarks identified.                           - 2 cm hiatal hernia.                           - Esophageal ulcers with no stigmata of recent                            bleeding.                           - Normal esophagus otherwise.                           - Non-bleeding gastric ulcers with a clean ulcer                            base (Forrest Class III).                           -  A few gastric polyps.                           - Normal stomach otherwise - biopsies taken to rule                            out H pylori.                           - 8 non-bleeding duodenal ulcers. One with stigmata                            of recent bleeding as above - treated with Purastat. Recommendation:           - Return patient to hospital ward for ongoing care.                           - Clear liquid diet.                           - Continue present medications.                           - Continue IV protonix                           - Continue to hold Plavix, but I think okay to                            resume baby aspirin in light  of very recent CVA if                            needed / recommended                           - Await pathology results.                           - We will follow, call with questions. Procedure Code(s):        --- Professional ---                           [id number], 61, Esophagogastroduodenoscopy, flexible,                            transoral; with control of bleeding, any method                           43239, Esophagogastroduodenoscopy, flexible,                            transoral; with biopsy, single or multiple Diagnosis Code(s):        --- Professional ---                           K44.9, Diaphragmatic hernia without obstruction or  gangrene                           K22.10, Ulcer of esophagus without bleeding                           K25.9, Gastric ulcer, unspecified as acute or                            chronic, without hemorrhage or perforation                           K31.7, Polyp of stomach and duodenum                           K26.9, Duodenal ulcer, unspecified as acute or                            chronic, without hemorrhage or perforation                           R10.10, Upper abdominal pain, unspecified CPT copyright 2022 American [employer] Association. All rights reserved. The codes documented in this report are preliminary and upon coder review may  be revised to meet current compliance requirements. Remo Lipps P. Aeron Lheureux, MD 05/03/2022 8:56:54 AM This report has been signed electronically. Number of Addenda: 0

## 2022-05-03 NOTE — Progress Notes (Addendum)
PROGRESS NOTE    Sherry [name]  [id number] DOB: September 30, 1930 DOA: 04/25/2022 PCP: Rusty Aus, MD   Brief Narrative: Sherry [name] is a 89 y.o. female with a history of hypertension, recurrent falls, breast cancer status postlumpectomy and radiation.  Patient presented secondary to headache, nausea, vomiting with associated dizziness.  MRI imaging confirmed presence of acute cerebellar infarct and CT angiogram of the head and neck was significant for left vertebral artery occlusion.  Patient was admitted for hypertensive emergency and acute stroke, initially requiring ICU admission and Cleviprex drip.  For antihypertensives restarted and Cleviprex weaned off.  Patient with intractable vertigo/nausea secondary to acute infarct.  Physical therapy recommending acute inpatient rehab on discharge, however no plan for discharge to SNF.   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 MRI confirms acute infarct of medial aspect of left cerebellar hemisphere in addition to likely acute/subacute infarct of the medial aspect of the right occipital lobe. CTA head and neck significant for an occluded left vertebral artery in the V2 segment. LDL of 122. Hemoglobin A1C of 5.6%. Transthoracic Echocardiogram significant for no atrial level shunt. Neurology recommendations for aspirin indefinitely and Plavix 75 mg for three months. PT/OT recommendations for acute inpatient rehabilitation, now with plan to discharge to SNF.  Hypertensive emergency Per history, patient's outpatient antihypertensives discontinued in December. Patient managed initially on Cleviprex and able to wean off. -Continue amlodipine 10 mg daily and metoprolol -Hold hydralazine while active GI bleeding present  Acute GI bleeding Patient with acute blood loss anemia with FOBT positive stool. Associated elevated BUN. GI consulted. CT abdomen/pelvis obtained  and suggests possible ulceration of the pylorus/descending portion of duodenum. Upper endoscopy performed on 3/4 and  was significant for esophageal/gastric and duodenal ulcers; one duodenal ulcer with stigmata of recent bleeding which was treated with Purastat. -GI recommendations: Clear liquid diet, resume aspirin, hold Plavix. -Protonix drip  Acute blood loss anemia Secondary to GI bleeding. Hemoglobin of 14.6 on admission. Hemoglobin down to 6.7 today. Patient consented for blood transfusion. Hemoglobin up to 10 post-transfusion and is stable at 10.2 today. -Daily CBC  Abdominal pain Unclear etiology but concern this is likely related to GI bleeding and anemia. Stat CT abdomen/pelvis obtained and confirmed concern for gastric/duodenal ulcer without evidence of perforation. GI consulted for GI bleeding.  Leukocytosis No source for infection noted; urinalysis not suggestive of infection. Chest x-ray without cardiopulmonary disease. Initial leukocytosis and downtrend, now with elevation with peak of 20,400 predominantly secondary to neutrophilia. WBC down to 14,000 today.  Hypokalemia -potassium supplementation  Acute urinary retention Unclear etiology. Possibly related to patient essentially being bed-bound. Foley catheter placed on 3/2 secondary to severe urinary retention. -Continue urinary catheter for 5-7 days prior to voiding trial, unless mobility significant improves  Lactic acidosis Present on admission. Improved with IV fluids.  Intractable nausea/vomiting Intractable vertigo Secondary to acute stroke. Therapies have not been successful in treating symptoms. -Continue Reglan scheduled and meclizine scheduled  Right breast nodule/mass 19 mm mixed density nodule/mass noted on CT chest. Patient with a history of right breast cancer s/p lumpectomy. Patient will need follow-up for nodule with mammography.   DVT prophylaxis: SCDs Code Status:   Code Status: Full Code Family  Communication: None at bedside. Called son via telephone but no response Disposition Plan: Discharge to SNF when bed available. Pending continued management/recommendations of active GI bleeding.   Consultants:  PCCM Neurology  Procedures:  2/26: Transthoracic Echocardiogram 3/4: Upper endoscopy  Antimicrobials: None   Subjective: Patient a bit groggy from PACU. No concerns at this time, however.  Objective: BP (!) 160/58 (BP Location: Right Arm)   Pulse 60   Temp 97.7 F (36.5 C)   Resp 18   Wt 58.5 kg   SpO2 97%   BMI 23.59 kg/m   Examination:  General exam: Appears calm and comfortable Respiratory system: Clear to auscultation. Respiratory effort normal. Cardiovascular system: S1 & S2 heard, RRR. No murmurs, rubs, gallops or clicks. Gastrointestinal system: Abdomen is nondistended, soft and nontender. No organomegaly or masses felt. Normal bowel sounds heard. Central nervous system: Alert. Musculoskeletal: No edema. No calf tenderness   Data Reviewed: I have personally reviewed following labs and imaging studies  CBC Lab Results  Component Value Date   WBC 14.0 (H) 05/03/2022   RBC 3.22 (L) 05/03/2022   HGB 10.2 (L) 05/03/2022   HCT 29.0 (L) 05/03/2022   MCV 90.1 05/03/2022   MCH 31.7 05/03/2022   PLT 261 05/03/2022   MCHC 35.2 05/03/2022   RDW 14.4 05/03/2022   LYMPHSABS 4.8 (H) 05/01/2022   MONOABS 1.4 (H) 05/01/2022   EOSABS 0.1 05/01/2022   BASOSABS 0.1 [id number]     Last metabolic panel Lab Results  Component Value Date   NA 137 05/03/2022   K 3.0 (L) 05/03/2022   CL 108 05/03/2022   CO2 21 (L) 05/03/2022   BUN 14 05/03/2022   CREATININE 0.43 (L) 05/03/2022   GLUCOSE 103 (H) 05/03/2022   GFRNONAA >60 05/03/2022   GFRAA >60 08/15/2017   CALCIUM 7.9 (L) 05/03/2022   PHOS 3.0 04/26/2022   PROT 7.0 04/25/2022   ALBUMIN 4.1 04/25/2022   BILITOT 0.9 04/25/2022   ALKPHOS 43 04/25/2022   AST 26 04/25/2022   ALT 23 04/25/2022   ANIONGAP  8 05/03/2022    GFR: Estimated Creatinine Clearance: 37.7 mL/min (A) (by C-G formula based on SCr of 0.43 mg/dL (L)).  Recent Results (from the past 240 hour(s))  MRSA Next Gen by PCR, Nasal     Status: None   Collection Time: 04/25/22  5:23 PM   Specimen: Nasal Mucosa; Nasal Swab  Result Value Ref Range Status   MRSA by PCR Next Gen NOT DETECTED NOT DETECTED Final    Comment: (NOTE) The GeneXpert MRSA Assay (FDA approved for NASAL specimens only), is one component of a comprehensive MRSA colonization surveillance program. It is not intended to diagnose MRSA infection nor to guide or monitor treatment for MRSA infections. Test performance is not FDA approved in patients less than 60 years old. Performed at [facility], [place] [address] [place], [place] [address]   Culture, blood (Routine X 2) w Reflex to ID Panel     Status: None   Collection Time: 04/26/22  8:18 AM   Specimen: BLOOD RIGHT HAND  Result Value Ref Range Status   Specimen Description BLOOD RIGHT HAND  Final   Special Requests   Final    BOTTLES DRAWN AEROBIC AND ANAEROBIC Blood Culture adequate volume   Culture   Final    NO GROWTH 5 DAYS Performed at [facility], [place] [address] [place],  [address]    Report Status 05/01/2022 FINAL  Final  Culture, blood (Routine X 2) w Reflex to ID Panel     Status: None   Collection Time: 04/26/22  8:23 AM   Specimen: BLOOD RIGHT HAND  Result Value Ref Range  Status   Specimen Description BLOOD RIGHT HAND  Final   Special Requests   Final    BOTTLES DRAWN AEROBIC AND ANAEROBIC Blood Culture adequate volume   Culture   Final    NO GROWTH 5 DAYS Performed at [facility], [address], [place], [place] [address]    Report Status 05/01/2022 FINAL  Final      Radiology Studies: CT ABDOMEN PELVIS W CONTRAST  Result Date: 05/02/2022 CLINICAL DATA:  Abdominal pain, acute. Nonlocalized. History of duodenal perforation. EXAM: CT ABDOMEN AND PELVIS  WITH CONTRAST TECHNIQUE: Multidetector CT imaging of the abdomen and pelvis was performed using the standard protocol following bolus administration of intravenous contrast. RADIATION DOSE REDUCTION: This exam was performed according to the departmental dose-optimization program which includes automated exposure control, adjustment of the mA and/or kV according to patient size and/or use of iterative reconstruction technique. CONTRAST:  66m OMNIPAQUE IOHEXOL 350 MG/ML SOLN COMPARISON:  05/17/2021 FINDINGS: Lower chest: No acute abnormality. Hepatobiliary: No suspicious liver abnormality. Small cyst within central left liver measures 0.8 cm and is unchanged from previous exam. Lobe of gallbladder appears distended. There are 2 no gallbladder wall thickening. No bile duct dilatation. Stones within the gallbladder measuring up to 2 cm. Pancreas: Cyst in uncinate process measures 8 mm and is unchanged from the previous exam. No main duct dilatation, inflammation or distension. Spleen: Normal in size without focal abnormality. Adrenals/Urinary Tract: Normal adrenal glands. No kidney mass, hydronephrosis, or nephrolithiasis. Urinary bladder is decompressed around a Foley catheter. Stomach/Bowel: There is wall thickening involving the pylorus and descending portion of the duodenum with surrounding soft tissue stranding. Transmural gas collection is identified within the anterior wall of the pylorus measuring 1.2 cm, image 34/3. On the previous exam this measures 1.1 cm. This may represent an area of transmural ulceration versus small duodenal diverticulum. No sign of perforation. The remaining small bowel loops are unremarkable. No pathologic dilatation of the large or small bowel loops. Sigmoid diverticulosis without acute diverticulitis. Moderate retained stool noted within the colon. Vascular/Lymphatic: Aortic atherosclerosis. No enlarged abdominal or pelvic lymph nodes. Reproductive: Status post hysterectomy. No  adnexal masses. Other: No free fluid or focal fluid collections. Musculoskeletal: Lumbar spondylosis and facet arthropathy. No acute or suspicious osseous findings. Postoperative changes from prior lumpectomy noted within the right breast. Soft tissue density nodule within the right lower breast measures 1.7 cm, image 18/3 previously this measured 2.0 cm. IMPRESSION: 1. There is wall thickening involving the pylorus and descending portion of the duodenum with surrounding soft tissue stranding. Transmural gas collection is identified within the anterior wall of the pylorus measuring 1.2 cm. On the previous exam this measures 1.1 cm. This may represent an area of transmural ulceration versus small duodenal diverticulum. No sign of perforation. 2. Gallstones with distended gallbladder. No gallbladder wall thickening. 3. Sigmoid diverticulosis without acute diverticulitis. 4. Moderate retained stool noted within the colon. Correlate for any clinical signs or symptoms of constipation. 5. Stable 8 mm cystic lesion in the uncinate process of the pancreas. Given the patient's age, no further workup or follow-up is recommended. 6. Soft tissue density nodule within the right lower breast measures 1.7 cm previously this measured 2.0 cm. 7.  Aortic Atherosclerosis (ICD10-I70.0). Electronically Signed   By: TKerby MoorsM.D.   On: 05/02/2022 10:39   DG CHEST PORT 1 VIEW  Result Date: 05/01/2022 CLINICAL DATA:  Leukocytosis. EXAM: PORTABLE CHEST 1 VIEW COMPARISON:  Chest radiograph dated 04/25/2022. FINDINGS: No  focal consolidation, pleural effusion, pneumothorax. Linear density along the lateral left lung most consistent with a skin fold. Repeat radiograph with repositioning of the patient may provide better evaluation. The cardiac silhouette is within normal limits. No acute osseous pathology. IMPRESSION: 1. No acute cardiopulmonary process. 2. Probable skin fold along the lateral left lung. Electronically Signed   By:  Anner Crete M.D.   On: 05/01/2022 17:52      LOS: 8 days    Sherry Poche, MD [facility] 05/03/2022, 9:40 AM   If 7PM-7AM, please contact night-coverage [email or web address]

## 2022-05-03 NOTE — Progress Notes (Signed)
Inpatient Rehab Admissions Coordinator:   Following for my colleague Danne Baxter.  Note therapy recommendations changed to SNF.  Will sign off at this time.   Shann Medal, PT, DPT Admissions Coordinator [phone] 05/03/22  4:49 PM

## 2022-05-03 NOTE — Anesthesia Preprocedure Evaluation (Addendum)
Anesthesia Evaluation  Patient identified by MRN, date of birth, ID band Patient awake    Reviewed: Allergy & Precautions, NPO status , Patient's Chart, lab work & pertinent test results  Airway Mallampati: III  TM Distance: >3 FB Neck ROM: Full    Dental no notable dental hx.    Pulmonary neg pulmonary ROS   Pulmonary exam normal        Cardiovascular hypertension, Pt. on medications and Pt. on home beta blockers Normal cardiovascular exam     Neuro/Psych   Anxiety      Neuromuscular disease CVA, Residual Symptoms    GI/Hepatic negative GI ROS, Neg liver ROS,,,  Endo/Other  negative endocrine ROS    Renal/GU Renal disease     Musculoskeletal  (+) Arthritis ,    Abdominal   Peds  Hematology  (+) Blood dyscrasia, anemia   Anesthesia Other Findings GI bleed  Reproductive/Obstetrics                             Anesthesia Physical Anesthesia Plan  ASA: 3  Anesthesia Plan: MAC   Post-op Pain Management:    Induction: Intravenous  PONV Risk Score and Plan: 2 and Propofol infusion and Treatment may vary due to age or medical condition  Airway Management Planned: Nasal Cannula  Additional Equipment:   Intra-op Plan:   Post-operative Plan:   Informed Consent: I have reviewed the patients History and Physical, chart, labs and discussed the procedure including the risks, benefits and alternatives for the proposed anesthesia with the patient or authorized representative who has indicated his/her understanding and acceptance.     Dental advisory given  Plan Discussed with: CRNA  Anesthesia Plan Comments:        Anesthesia Quick Evaluation

## 2022-05-03 NOTE — Anesthesia Procedure Notes (Signed)
Procedure Name: MAC Date/Time: 05/03/2022 8:17 AM  Performed by: Anastasio Auerbach, CRNAPre-anesthesia Checklist: Emergency Drugs available, Suction available and Patient being monitored Oxygen Delivery Method: Nasal cannula Induction Type: IV induction

## 2022-05-03 NOTE — Progress Notes (Signed)
PT Cancellation Note  Patient Details Name: Sherry [name] MRN: [id number] DOB: 08-09-1930   Cancelled Treatment:    Reason Eval/Treat Not Completed: Patient at procedure or test/unavailable  [name], PT, DPT Acute Rehabilitation Services Office [place] 05/03/2022, 8:04 AM

## 2022-05-03 NOTE — Transfer of Care (Signed)
Immediate Anesthesia Transfer of Care Note  Patient: Sherry [name]  Procedure(s) Performed: ESOPHAGOGASTRODUODENOSCOPY (EGD) WITH PROPOFOL HEMOSTASIS CONTROL BIOPSY  Patient Location: PACU  Anesthesia Type:MAC  Level of Consciousness: awake and drowsy at baseline  Airway & Oxygen Therapy: Patient connected to nasal cannula oxygen  Post-op Assessment: Report given to RN and Post -op Vital signs reviewed and stable  Post vital signs: Reviewed and stable  Last Vitals:  Vitals Value Taken Time  BP 122/57 05/03/22 0843  Temp    Pulse 66 05/03/22 0844  Resp 20 05/03/22 0844  SpO2 97 % 05/03/22 0844  Vitals shown include unvalidated device data.  Last Pain:  Vitals:   05/03/22 0746  TempSrc: Temporal  PainSc: 0-No pain      Patients Stated Pain Goal: 3 ([id number])  Complications: No notable events documented.

## 2022-05-03 NOTE — Interval H&P Note (Signed)
History and Physical Interval Note: Patient stable this AM - Hgb in 10s. She has some mild abdominal tenderness, otherwise without complaints. I called her husband and discussed her situation. This is my first time meeting them. He understands why endoscopy is being recommended and risks / benefits. Suspect she has a duodenal ulcer based on imaging, will see if she needs endoscopic therapy or not pending findings. Last dose ASA and PLavix on 3/2. Further recommendations pending the results, I will notify him of results. All questions answered. They wish to proceed.  05/03/2022 8:12 AM  Dori Vira Blanco  has presented today for surgery, with the diagnosis of GI bleed.  The various methods of treatment have been discussed with the patient and family. After consideration of risks, benefits and other options for treatment, the patient has consented to  Procedure(s): ESOPHAGOGASTRODUODENOSCOPY (EGD) WITH PROPOFOL (N/A) as a surgical intervention.  The patient's history has been reviewed, patient examined, no change in status, stable for surgery.  I have reviewed the patient's chart and labs.  Questions were answered to the patient's satisfaction.     Lorain

## 2022-05-03 NOTE — Progress Notes (Signed)
Physical Therapy Treatment Patient Details Name: Sherry [name] MRN: [id number] DOB: 04/14/1930 Today's Date: 05/03/2022   History of Present Illness Pt is an 89 yo female presenting 2/25 with nausea and headache, CT angiogram showed occlusion of the left vertebral artery and MRI showed left cerebellar stroke with a possible small area of restricted diffusion in the right occipital lobe. PMH includes: uncontrolled HTN, and recent fall with concussion.    PT Comments    Pt not progressing significantly towards her physical therapy goals; significantly fatigued post endoscopy today. Pt able to participate in bed level exercises and requiring maxA + 2 for bed mobility. After short period of time sitting on EOB, pt requesting to lie back down. Continue to recommend SNF for ongoing Physical Therapy.      Recommendations for follow up therapy are one component of a multi-disciplinary discharge planning process, led by the attending physician.  Recommendations may be updated based on patient status, additional functional criteria and insurance authorization.  Follow Up Recommendations  Skilled nursing-short term rehab (<3 hours/day) Can patient physically be transported by private vehicle: No   Assistance Recommended at Discharge Frequent or constant Supervision/Assistance  Patient can return home with the following Two people to help with walking and/or transfers;Two people to help with bathing/dressing/bathroom;Assistance with cooking/housework;Direct supervision/assist for medications management;Assistance with feeding;Direct supervision/assist for financial management;Assist for transportation;Help with stairs or ramp for entrance   Equipment Recommendations  Other (comment) (defer to post acute)    Recommendations for Other Services       Precautions / Restrictions Precautions Precautions: Fall Precaution Comments: dizziness Restrictions Weight Bearing Restrictions: No      Mobility  Bed Mobility Overal bed mobility: Needs Assistance Bed Mobility: Rolling, Sidelying to Sit Rolling: Mod assist Sidelying to sit: Max assist, HOB elevated, +2 for physical assistance   Sit to supine: Max assist, +2 for physical assistance   General bed mobility comments: Decreased truncal/UE control with rolling, maxA + 2 for supine <> sit with assist with BLE's and trunk    Transfers                   General transfer comment: Pt deferring    Ambulation/Gait               General Gait Details: unable   Stairs             Wheelchair Mobility    Modified Rankin (Stroke Patients Only) Modified Rankin (Stroke Patients Only) Pre-Morbid Rankin Score: No symptoms Modified Rankin: Severe disability     Balance Overall balance assessment: Needs assistance Sitting-balance support: Single extremity supported, Bilateral upper extremity supported, Feet supported Sitting balance-Leahy Scale: Poor Sitting balance - Comments: Pt needing min-modA for static sitting balance, anterior lean                                    Cognition Arousal/Alertness: Awake/alert Behavior During Therapy: Flat affect Overall Cognitive Status: Impaired/Different from baseline Area of Impairment: Problem solving, Awareness, Attention, Following commands                   Current Attention Level: Sustained   Following Commands: Follows one step commands consistently, Follows one step commands with increased time, Follows multi-step commands inconsistently   Awareness: Emergent Problem Solving: Slow processing, Decreased initiation, Difficulty sequencing, Requires verbal cues, Requires tactile cues  Exercises General Exercises - Lower Extremity Heel Slides: Both, 5 reps, Supine Hip ABduction/ADduction: Both, 5 reps, Supine Straight Leg Raises: Both, 5 reps, Supine    General Comments        Pertinent Vitals/Pain Pain  Assessment Pain Assessment: Faces Faces Pain Scale: No hurt    Home Living                          Prior Function            PT Goals (current goals can now be found in the care plan section) Acute Rehab PT Goals PT Goal Formulation: With patient Time For Goal Achievement: 05/10/22 Potential to Achieve Goals: Fair Progress towards PT goals: Not progressing toward goals - comment    Frequency    Min 3X/week      PT Plan Current plan remains appropriate    Co-evaluation              AM-PAC PT "6 Clicks" Mobility   Outcome Measure  Help needed turning from your back to your side while in a flat bed without using bedrails?: A Lot Help needed moving from lying on your back to sitting on the side of a flat bed without using bedrails?: A Lot Help needed moving to and from a bed to a chair (including a wheelchair)?: A Lot Help needed standing up from a chair using your arms (e.g., wheelchair or bedside chair)?: A Lot Help needed to walk in hospital room?: Total Help needed climbing 3-5 steps with a railing? : Total 6 Click Score: 10    End of Session   Activity Tolerance: Patient limited by fatigue Patient left: with call bell/phone within reach;in bed;with bed alarm set Nurse Communication: Mobility status PT Visit Diagnosis: Other abnormalities of gait and mobility (R26.89);Muscle weakness (generalized) (M62.81);History of falling (Z91.81);Difficulty in walking, not elsewhere classified (R26.2);Unsteadiness on feet (R26.81);Dizziness and giddiness (R42)     Time: [id number] PT Time Calculation (min) (ACUTE ONLY): 29 min  Charges:  $Therapeutic Activity: 23-37 mins                     [name], PT, DPT Acute Rehabilitation Services Office [phone]    Deno Etienne 05/03/2022, 4:39 PM

## 2022-05-03 NOTE — Plan of Care (Signed)
  Problem: Fluid Volume: Goal: Hemodynamic stability will improve Outcome: Progressing   Problem: Pain Managment: Goal: General experience of comfort will improve Outcome: Progressing   Problem: Health Behavior/Discharge Planning: Goal: Ability to manage health-related needs will improve Outcome: Not Progressing   Problem: Nutrition: Goal: Dietary intake will improve Outcome: Not Progressing   Problem: Activity: Goal: Risk for activity intolerance will decrease Outcome: Not Progressing

## 2022-05-03 NOTE — Progress Notes (Signed)
STROKE TEAM PROGRESS NOTE   INTERVAL HISTORY No family [name] at the bedside. Patient lying in bed, lethargic after EGD but easily arousable and neuro stable. Plan to restart ASA tomorrow but d/c plavix give the GIB.   Vitals:   05/03/22 0843 05/03/22 0845 05/03/22 0900 05/03/22 1111  BP: (!) 122/57 (!) 127/99 (!) 160/58 (!) 147/58  Pulse: 63 66 60 69  Resp: '18 20 18 15  '$ Temp: 97.7 F (36.5 C)   98.1 F (36.7 C)  TempSrc:    Oral  SpO2: 97% 97% 97% 99%  Weight:       CBC:  Recent Labs  Lab 05/01/22 1251 05/02/22 0858 05/02/22 2037 05/03/22 0451  WBC 20.4*  21.0* 14.5*  --  14.0*  NEUTROABS 13.6*  --   --   --   HGB 7.8*  8.1* 6.7* 10.0* 10.2*  HCT 24.2*  23.3* 20.9* 28.4* 29.0*  MCV 93.4  90.0 94.1  --  90.1  PLT 344  331 297  --  0000000   Basic Metabolic Panel:  Recent Labs  Lab 05/02/22 0858 05/03/22 0451  NA 141 137  K 3.6 3.0*  CL 112* 108  CO2 22 21*  GLUCOSE 114* 103*  BUN 25* 14  CREATININE 0.48 0.43*  CALCIUM 8.4* 7.9*   Lipid Panel:  No results for input(s): "CHOL", "TRIG", "HDL", "CHOLHDL", "VLDL", "LDLCALC" in the last 168 hours.   IMAGING past 24 hours No results found.  PHYSICAL EXAM  Temp:  [97.4 F (36.3 C)-98.3 F (36.8 C)] 98.1 F (36.7 C) (03/04 1111) Pulse Rate:  [60-81] 69 (03/04 1111) Resp:  [14-20] 15 (03/04 1111) BP: (122-170)/(51-99) 147/58 (03/04 1111) SpO2:  [97 %-100 %] 99 % (03/04 1111) Weight:  [58.5 kg] 58.5 kg (03/04 0500)  General - Well nourished, well developed, in no apparent distress. Cardiovascular - normal rate and rhythm on monitor except intermittent PACs and PVCs.   Neuro - lethargic but easily arousble, eyes open, orientated to people, age and place, not orientated to atime. No aphasia, paucity of speech but able tell me [name] feeling better today and thanked me, hypophonia although improved from yesterday, following all simple commands. Able to name and repeat in soft voice. No gaze palsy, no nystagmus,  tracking bilaterally, blinking to visual threat bilaterally, PERRL. No facial droop. Tongue midline. LUE 4-/5 and RUE 4/5, BLE 3/5. Sensation symmetrical subjectively, left FTN significant ataxia, right FTN grossly intact, gait not tested.     ASSESSMENT/PLAN Sherry [name] a 89 y.o. female with history of uncontrolled hypertension, breast cancer, syncope, chronic neck pain presenting with presented to the emergency room for sudden onset of headache and just a general sensation of not feeling well upon awakening . CT angiogram revealed occlusion of the left vertebral artery, V2 level onwards with reconstitution proximal to the vertebrobasilar junction. MRI brain revealed left cerebellar stroke with a possible small area of restricted diffusion in the right occipital lobe. IR procedure cancelled due to benefit vs risk stratification.   Acute Left cerebellar Infarct and punctate right occipital infarct, etiology:  occluded left VA secondary to atherosclerosis vs. dissection Code Stroke CT head: No acute findings.   CTA head & neck: Occluded Left V2 segment, appears new since November and with reconstitution only at the vertebrobasilar junction. This could be secondary to vertebral artery dissection or atherosclerosis occlusion. Advanced Intracranial Atherosclerosis with widespread Moderate And Severe 2nd and 3rd COW branch stenoses. No discrete branch occlusion MRI  Acute infarct in the medial aspect of the left cerebellar hemisphere. Additional region of increased signal on diffusion-weighted imaging in the medial aspect of the right occipital lobe, favored to represent an additional site of an acute to subacute infarct. Repeat CT 3/1 Expected CT appearance of Left cerebellum PICA infarct.  2D Echo: LVEF 70-75% LDL 122 HgbA1c: 5.6 VTE prophylaxis - lovenox No antithrombotic prior to admission, was on aspirin 81 mg daily and clopidogrel 75 mg daily, will plan to resume ASA 81 tomorrow but not  plavix. Therapy recommendations:  AIR Disposition:  pending  Lethargy  Leukocytosis Acute blood loss anemia GI bleeding Persistent lethargy, improving Likely from severe anemia and GIB WBC 17.8->13.8->20.4->14.5->14.0 Hemoglobin 14.6->11.2->7.8->6.7->PRBC->10.2 Afebrile UA negative CXR negative Stool occult blood positive CT abd/pelvis -  an area of transmural ulceration versus small duodenal diverticulum. No sign of perforation. GI consulted EGD showed several non-bleeding duodenal ulcer but one has evidence of recent bleeding s/p Purastat On protonix IV Plan to restart ASA 81 in am but not plavix  Hypertension Home meds:  Toprol-XL '25mg'$ , Norvasc '10mg'$ , Hydralazine '50mg'$ , HCTZ '25mg'$ . Stable but fluctuate On amoldipine and metoprolol Long-term BP goal normotensive  Hyperlipidemia Home meds:  none LDL 122, goal < 70 Add Lipitor '40mg'$  Continue statin at discharge  Other Stroke Risk Factors Advanced Age >/= 27   Other Active Problems Nausea Zofran, Reglan added PRN Hx of syncope Previous Holter Monitor monitoring 12/2021 for syncope: NSR with episodes of tachycardia up to 120. Toprol-XL '25mg'$  daily added after this. Hx of chronic neck pain Hx of Breast Cancer  Hospital day # 8   Sherry Hawking, MD PhD Stroke Neurology 05/03/2022 12:18 PM   To contact Stroke Continuity provider, please refer to [email or web address] After hours, contact General Neurology

## 2022-05-03 NOTE — Anesthesia Postprocedure Evaluation (Signed)
Anesthesia Post Note  Patient: Sherry [name]  Procedure(s) Performed: ESOPHAGOGASTRODUODENOSCOPY (EGD) WITH PROPOFOL HEMOSTASIS CONTROL BIOPSY     Patient location during evaluation: Endoscopy Anesthesia Type: MAC Level of consciousness: awake Pain management: pain level controlled Vital Signs Assessment: post-procedure vital signs reviewed and stable Respiratory status: spontaneous breathing, nonlabored ventilation and respiratory function stable Cardiovascular status: blood pressure returned to baseline and stable Postop Assessment: no apparent nausea or vomiting Anesthetic complications: no  No notable events documented.  Last Vitals:  Vitals:   05/03/22 1520 05/03/22 1939  BP: (!) 151/56 (!) 145/54  Pulse: 68 64  Resp: 16 16  Temp: 36.7 C 37.2 C  SpO2: 98% 97%    Last Pain:  Vitals:   05/03/22 2005  TempSrc:   PainSc: 0-No pain                 Sade Mehlhoff P Bevin Mayall

## 2022-05-04 DIAGNOSIS — D649 Anemia, unspecified: Secondary | ICD-10-CM

## 2022-05-04 DIAGNOSIS — Z7902 Long term (current) use of antithrombotics/antiplatelets: Secondary | ICD-10-CM

## 2022-05-04 LAB — BASIC METABOLIC PANEL
Anion gap: 9 (ref 5–15)
BUN: 10 mg/dL (ref 8–23)
CO2: 22 mmol/L (ref 22–32)
Calcium: 7.9 mg/dL — ABNORMAL LOW (ref 8.9–10.3)
Chloride: 103 mmol/L (ref 98–111)
Creatinine, Ser: 0.57 mg/dL (ref 0.44–1.00)
GFR, Estimated: >60 mL/min (ref >60)
Glucose, Bld: 191 mg/dL — ABNORMAL HIGH (ref 70–99)
Potassium: 2.9 mmol/L — ABNORMAL LOW (ref 3.5–5.1)
Sodium: 134 mmol/L — ABNORMAL LOW (ref 135–145)

## 2022-05-04 LAB — GLUCOSE, CAPILLARY
Glucose-Capillary: 167 mg/dL — ABNORMAL HIGH (ref 70–99)
Glucose-Capillary: 103 mg/dL — ABNORMAL HIGH (ref 70–99)
Glucose-Capillary: 121 mg/dL — ABNORMAL HIGH (ref 70–99)
Glucose-Capillary: 127 mg/dL — ABNORMAL HIGH (ref 70–99)

## 2022-05-04 LAB — CBC WITH DIFFERENTIAL/PLATELET
Abs Immature Granulocytes: 0.36 10*3/uL — ABNORMAL HIGH (ref 0.00–0.07)
Basophils Absolute: 0.1 10*3/uL (ref 0.0–0.1)
Basophils Relative: 1 %
Eosinophils Absolute: 0.1 10*3/uL (ref 0.0–0.5)
Eosinophils Relative: 1 %
HCT: 28.5 % — ABNORMAL LOW (ref 36.0–46.0)
Hemoglobin: 9.9 g/dL — ABNORMAL LOW (ref 12.0–15.0)
Immature Granulocytes: 3 %
Lymphocytes Relative: 21 %
Lymphs Abs: 2.8 10*3/uL (ref 0.7–4.0)
MCH: 31.3 pg (ref 26.0–34.0)
MCHC: 34.7 g/dL (ref 30.0–36.0)
MCV: 90.2 fL (ref 80.0–100.0)
Monocytes Absolute: 1.2 10*3/uL — ABNORMAL HIGH (ref 0.1–1.0)
Monocytes Relative: 9 %
Neutro Abs: 8.9 10*3/uL — ABNORMAL HIGH (ref 1.7–7.7)
Neutrophils Relative %: 65 %
Platelets: 306 10*3/uL (ref 150–400)
RBC: 3.16 MIL/uL — ABNORMAL LOW (ref 3.87–5.11)
RDW: 14 % (ref 11.5–15.5)
WBC: 13.4 10*3/uL — ABNORMAL HIGH (ref 4.0–10.5)
nRBC: 0 % (ref 0.0–0.2)

## 2022-05-04 LAB — POTASSIUM: Potassium: 3.3 mmol/L — ABNORMAL LOW (ref 3.5–5.1)

## 2022-05-04 LAB — SURGICAL PATHOLOGY

## 2022-05-04 MED ORDER — TRAZODONE HCL 50 MG PO TABS
25.0000 mg | ORAL_TABLET | Freq: Every evening | ORAL | Status: DC | PRN
  Administered 2022-05-04 – 2022-05-09 (×4): 25 mg via ORAL
  Filled 2022-05-04 (×4): qty 1

## 2022-05-04 MED ORDER — POTASSIUM CHLORIDE CRYS ER 20 MEQ PO TBCR
40.0000 meq | EXTENDED_RELEASE_TABLET | ORAL | Status: AC
  Administered 2022-05-04 (×2): 40 meq via ORAL
  Filled 2022-05-04 (×2): qty 2

## 2022-05-04 MED ORDER — POTASSIUM CHLORIDE CRYS ER 20 MEQ PO TBCR
40.0000 meq | EXTENDED_RELEASE_TABLET | Freq: Once | ORAL | Status: AC
  Administered 2022-05-04: 40 meq via ORAL
  Filled 2022-05-04: qty 2

## 2022-05-04 NOTE — Plan of Care (Signed)
  Problem: Education: Goal: Knowledge of disease or condition will improve Outcome: Progressing   Problem: Coping: Goal: Will identify appropriate support needs Outcome: Progressing   Problem: Nutrition: Goal: Risk of aspiration will decrease Outcome: Progressing Goal: Dietary intake will improve Outcome: Progressing   Problem: Health Behavior/Discharge Planning: Goal: Ability to manage health-related needs will improve Outcome: Not Progressing

## 2022-05-04 NOTE — Progress Notes (Signed)
PROGRESS NOTE    Sherry [name]  [id number] DOB: 05/26/1930 DOA: 04/25/2022 PCP: Rusty Aus, MD   Brief Narrative: Sherry [name] is a 89 y.o. female with a history of hypertension, recurrent falls, breast cancer status postlumpectomy and radiation.  Patient presented secondary to headache, nausea, vomiting with associated dizziness.  MRI imaging confirmed presence of acute cerebellar infarct and CT angiogram of the head and neck was significant for left vertebral artery occlusion.  Patient was admitted for hypertensive emergency and acute stroke, initially requiring ICU admission and Cleviprex drip.  For antihypertensives restarted and Cleviprex weaned off.  Patient with intractable vertigo/nausea secondary to acute infarct.  Physical therapy recommending acute inpatient rehab on discharge, however no plan for discharge to SNF.   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 MRI confirms acute infarct of medial aspect of left cerebellar hemisphere in addition to likely acute/subacute infarct of the medial aspect of the right occipital lobe. CTA head and neck significant for an occluded left vertebral artery in the V2 segment. LDL of 122. Hemoglobin A1C of 5.6%. Transthoracic Echocardiogram significant for no atrial level shunt. Neurology recommendations for aspirin indefinitely and Plavix 75 mg for three months. PT/OT recommendations for acute inpatient rehabilitation, now with plan to discharge to SNF.  Hypertensive emergency Per history, patient's outpatient antihypertensives discontinued in December. Patient managed initially on Cleviprex and able to wean off. Hydralazine held secondary to GI bleeding. Low-normal/normotensive -Continue amlodipine 10 mg daily and metoprolol -Hold hydralazine  Acute GI bleeding Patient with acute blood loss anemia with FOBT positive stool. Associated elevated BUN. GI  consulted. CT abdomen/pelvis obtained and suggests possible ulceration of the pylorus/descending portion of duodenum. Upper endoscopy performed on 3/4 and  was significant for esophageal/gastric and duodenal ulcers; one duodenal ulcer with stigmata of recent bleeding which was treated with Purastat. -GI recommendations: Full liquid diet, continue aspirin, hold Plavix. -Protonix -Follow-up biopsy (3/4) follow-up  Acute blood loss anemia Secondary to GI bleeding. Hemoglobin of 14.6 on admission. Hemoglobin down to 6.7 today. Patient consented for blood transfusion. Hemoglobin up to 10 post-transfusion and is stable at 9.9 today. -Daily CBC  Abdominal pain Unclear etiology but concern this is likely related to GI bleeding and anemia. Stat CT abdomen/pelvis obtained and confirmed concern for gastric/duodenal ulcer without evidence of perforation. GI consulted for GI bleeding.  Leukocytosis No source for infection noted; urinalysis not suggestive of infection. Chest x-ray without cardiopulmonary disease. Initial leukocytosis and downtrend, now with elevation with peak of 20,400 predominantly secondary to neutrophilia. WBC down to 14,000 today.  Hypokalemia -potassium supplementation  Acute urinary retention Unclear etiology. Possibly related to patient essentially being bed-bound. Foley catheter placed on 3/2 secondary to severe urinary retention. -Continue urinary catheter for 5-7 days post insertion prior to attempting voiding trial, unless mobility significant improves  Lactic acidosis Present on admission. Improved with IV fluids.  Intractable nausea/vomiting Intractable vertigo Secondary to acute stroke. Therapies have not been successful in treating symptoms however she reports her dizziness is improved today. -Continue Reglan scheduled and meclizine scheduled -Zofran PRN  Right breast nodule/mass 19 mm mixed density nodule/mass noted on CT chest. Patient with a history of right  breast cancer s/p lumpectomy. Patient will need follow-up for nodule with mammography.   DVT prophylaxis: SCDs Code Status:   Code Status: Full Code Family Communication: None at bedside. Called son and husband via telephone but no response. Called three separate numbers  Disposition Plan: Discharge to SNF when bed available and pending stable hemoglobin. Discharge likely in 1-3 days   Consultants:  PCCM Neurology  Procedures:  2/26: Transthoracic Echocardiogram 3/4: Upper endoscopy  Antimicrobials: None   Subjective: Patient is hoping she will get better. Headache today. Dizziness and nausea is improved from before but still present.  Objective: BP (!) 130/55 (BP Location: Left Arm)   Pulse 73   Temp 99.1 F (37.3 C) (Oral)   Resp 16   Wt 58.5 kg   SpO2 98%   BMI 23.59 kg/m   Examination:  General exam: Appears calm and comfortable  Respiratory system: Clear to auscultation. Respiratory effort normal. Cardiovascular system: S1 & S2 heard, RRR. Gastrointestinal system: Abdomen is nondistended, soft and nontender. Normal bowel sounds heard. Central nervous system: Alert and oriented. No focal neurological deficits. Musculoskeletal: No edema. No calf tenderness    Data Reviewed: I have personally reviewed following labs and imaging studies  CBC Lab Results  Component Value Date   WBC 13.4 (H) 05/04/2022   RBC 3.16 (L) 05/04/2022   HGB 9.9 (L) 05/04/2022   HCT 28.5 (L) 05/04/2022   MCV 90.2 05/04/2022   MCH 31.3 05/04/2022   PLT 306 05/04/2022   MCHC 34.7 05/04/2022   RDW 14.0 05/04/2022   LYMPHSABS 2.8 05/04/2022   MONOABS 1.2 (H) 05/04/2022   EOSABS 0.1 05/04/2022   BASOSABS 0.1 [id number]     Last metabolic panel Lab Results  Component Value Date   NA 134 (L) 05/04/2022   K 2.9 (L) 05/04/2022   CL 103 05/04/2022   CO2 22 05/04/2022   BUN 10 05/04/2022   CREATININE 0.57 05/04/2022   GLUCOSE 191 (H) 05/04/2022   GFRNONAA >60 05/04/2022   GFRAA  >60 08/15/2017   CALCIUM 7.9 (L) 05/04/2022   PHOS 3.0 04/26/2022   PROT 7.0 04/25/2022   ALBUMIN 4.1 04/25/2022   BILITOT 0.9 04/25/2022   ALKPHOS 43 04/25/2022   AST 26 04/25/2022   ALT 23 04/25/2022   ANIONGAP 9 05/04/2022    GFR: Estimated Creatinine Clearance: 37.7 mL/min (by C-G formula based on SCr of 0.57 mg/dL).  Recent Results (from the past 240 hour(s))  MRSA Next Gen by PCR, Nasal     Status: None   Collection Time: 04/25/22  5:23 PM   Specimen: Nasal Mucosa; Nasal Swab  Result Value Ref Range Status   MRSA by PCR Next Gen NOT DETECTED NOT DETECTED Final    Comment: (NOTE) The GeneXpert MRSA Assay (FDA approved for NASAL specimens only), is one component of a comprehensive MRSA colonization surveillance program. It is not intended to diagnose MRSA infection nor to guide or monitor treatment for MRSA infections. Test performance is not FDA approved in patients less than 87 years old. Performed at [facility], [place] [address], [place], [place] [address]   Culture, blood (Routine X 2) w Reflex to ID Panel     Status: None   Collection Time: 04/26/22  8:18 AM   Specimen: BLOOD RIGHT HAND  Result Value Ref Range Status   Specimen Description BLOOD RIGHT HAND  Final   Special Requests   Final    BOTTLES DRAWN AEROBIC AND ANAEROBIC Blood Culture adequate volume   Culture   Final    NO GROWTH 5 DAYS Performed at [facility], [place] [address], [place], [place] [address]    Report Status 05/01/2022 FINAL  Final  Culture, blood (Routine X 2) w Reflex to  ID Panel     Status: None   Collection Time: 04/26/22  8:23 AM   Specimen: BLOOD RIGHT HAND  Result Value Ref Range Status   Specimen Description BLOOD RIGHT HAND  Final   Special Requests   Final    BOTTLES DRAWN AEROBIC AND ANAEROBIC Blood Culture adequate volume   Culture   Final    NO GROWTH 5 DAYS Performed at [facility], [address], [place], [place] [address]    Report Status  05/01/2022 FINAL  Final      Radiology Studies: No results found.    LOS: 9 days    Cordelia Poche, MD [facility] 05/04/2022, 11:28 AM   If 7PM-7AM, please contact night-coverage [email or web address]

## 2022-05-04 NOTE — Progress Notes (Signed)
      Progress Note   Subjective  Patient has been tolerating clear liquid diet. No pain. Hgb stable.    Objective   Vital signs in last 24 hours: Temp:  [98 F (36.7 C)-99.1 F (37.3 C)] 99.1 F (37.3 C) (03/05 1104) Pulse Rate:  [64-75] 73 (03/05 1104) Resp:  [15-16] 16 (03/05 1104) BP: (130-151)/(47-76) 130/55 (03/05 1104) SpO2:  [97 %-99 %] 98 % (03/05 1104) Last BM Date : 05/04/22 General:    white female in NAD Neurologic:  Alert and oriented Psych:  Cooperative. Normal mood and affect.  Intake/Output from previous day: 03/04 0701 - 03/05 0700 In: 2522.3 [P.O.:120; I.V.:2002.3; IV Piggyback:400] Out: 1950 [Urine:1950] Intake/Output this shift: Total I/O In: 120 [P.O.:120] Out: -   Lab Results: Recent Labs    05/02/22 0858 05/02/22 2037 05/03/22 0451 05/04/22 0803  WBC 14.5*  --  14.0* 13.4*  HGB 6.7* 10.0* 10.2* 9.9*  HCT 20.9* 28.4* 29.0* 28.5*  PLT 297  --  261 306   BMET Recent Labs    05/02/22 0858 05/03/22 0451 05/04/22 0803  NA 141 137 134*  K 3.6 3.0* 2.9*  CL 112* 108 103  CO2 22 21* 22  GLUCOSE 114* 103* 191*  BUN 25* 14 10  CREATININE 0.48 0.43* 0.57  CALCIUM 8.4* 7.9* 7.9*   LFT No results for input(s): "PROT", "ALBUMIN", "AST", "ALT", "ALKPHOS", "BILITOT", "BILIDIR", "IBILI" in the last 72 hours. PT/INR No results for input(s): "LABPROT", "INR" in the last 72 hours.  Studies/Results: No results found.     Assessment / Plan:    89 y/o female here with the following:  Upper GI bleed in the setting of antiplatelet use Anemia Recent CVA  EGD yesterday showed esophageal ulcers, gastric ulcers, and numerous duodenal ulcers. All clean based with exception of one largest duodenal ulcer which was deeply cratered with a red spot / suspected vessel. Attempts at hemostasis clip not successful (rather cratered and difficult to adhere to the mucosa - caused mucosal trauma), treated with Purastat with good result. She has had a  downtrending BUN, no overt bleeding. Hgb stable.  Okay to continue aspirin right now. Plavix is still in her system from 3/2. If she does well today then perhaps can consider resuming Plavix in next 24 hours given recent CVA. Awaiting pathology results, rule out H pylori. Can advance diet to full liquids if she has no diet restrictions from CVA.   RECOMMEND: - full liquid diet - continue IV Protonix drip for 72 hours from time of EGD, then transition to '40mg'$  BID - continue aspirin - monitor for bleeding symptoms, trend Hgb - if no further bleeding would cautiously resume Plavix perhaps tomorrow   Call with questions we will reassess her tomorrow.  Jolly Mango, MD [facility]

## 2022-05-04 NOTE — Progress Notes (Signed)
STROKE TEAM PROGRESS NOTE   INTERVAL HISTORY No family is at the bedside. Patient lying in bed, lethargic, easily arousble on voice. Her voice is stronger than before and she felt better and looking forward to rehab. Hb stable, WBC continues to trend down. On liquid diet, will decreased IVF. K is low, on supplement.    Vitals:   05/03/22 2324 05/04/22 0422 05/04/22 0712 05/04/22 1104  BP: 135/62 (!) 146/76 (!) 141/47 (!) 130/55  Pulse: 65 75 70 73  Resp: '16 15 15 16  '$ Temp: 98.6 F (37 C) 98.7 F (37.1 C) 98.9 F (37.2 C) 99.1 F (37.3 C)  TempSrc: Oral Oral Oral Oral  SpO2: 99% 97% 97% 98%  Weight:       CBC:  Recent Labs  Lab 05/01/22 1251 05/02/22 0858 05/03/22 0451 05/04/22 0803  WBC 20.4*  21.0*   < > 14.0* 13.4*  NEUTROABS 13.6*  --   --  8.9*  HGB 7.8*  8.1*   < > 10.2* 9.9*  HCT 24.2*  23.3*   < > 29.0* 28.5*  MCV 93.4  90.0   < > 90.1 90.2  PLT 344  331   < > 261 306   < > = values in this interval not displayed.   Basic Metabolic Panel:  Recent Labs  Lab 05/03/22 0451 05/04/22 0803  NA 137 134*  K 3.0* 2.9*  CL 108 103  CO2 21* 22  GLUCOSE 103* 191*  BUN 14 10  CREATININE 0.43* 0.57  CALCIUM 7.9* 7.9*   Lipid Panel:  No results for input(s): "CHOL", "TRIG", "HDL", "CHOLHDL", "VLDL", "LDLCALC" in the last 168 hours.   IMAGING past 24 hours No results found.  PHYSICAL EXAM  Temp:  [98 F (36.7 C)-99.1 F (37.3 C)] 99.1 F (37.3 C) (03/05 1104) Pulse Rate:  [64-75] 73 (03/05 1104) Resp:  [15-16] 16 (03/05 1104) BP: (130-151)/(47-76) 130/55 (03/05 1104) SpO2:  [97 %-99 %] 98 % (03/05 1104)  General - Well nourished, well developed, in no apparent distress. Cardiovascular - normal rate and rhythm on monitor except intermittent PACs and PVCs.   Neuro - lethargic but easily arousble, eyes open, orientated to people, age and place, not orientated to atime. No aphasia, paucity of speech but able tell me she is feeling better today and thanked  me, hypophonia although improved from yesterday, following all simple commands. Able to name and repeat in soft voice. No gaze palsy, no nystagmus, tracking bilaterally, blinking to visual threat bilaterally, PERRL. No facial droop. Tongue midline. LUE 4-/5 and RUE 4/5, BLE 3/5. Sensation symmetrical subjectively, left FTN significant ataxia, right FTN grossly intact, gait not tested.     ASSESSMENT/PLAN Sherry [name] is a 89 y.o. female with history of uncontrolled hypertension, breast cancer, syncope, chronic neck pain presenting with presented to the emergency room for sudden onset of headache and just a general sensation of not feeling well upon awakening . CT angiogram revealed occlusion of the left vertebral artery, V2 level onwards with reconstitution proximal to the vertebrobasilar junction. MRI brain revealed left cerebellar stroke with a possible small area of restricted diffusion in the right occipital lobe. IR procedure cancelled due to benefit vs risk stratification.   Acute Left cerebellar Infarct and punctate right occipital infarct, etiology:  occluded left VA secondary to atherosclerosis vs. dissection Code Stroke CT head: No acute findings.   CTA head & neck: Occluded Left V2 segment, appears new since November and with reconstitution only  at the vertebrobasilar junction. This could be secondary to vertebral artery dissection or atherosclerosis occlusion. Advanced Intracranial Atherosclerosis with widespread Moderate And Severe 2nd and 3rd COW branch stenoses. No discrete branch occlusion MRI  Acute infarct in the medial aspect of the left cerebellar hemisphere. Additional region of increased signal on diffusion-weighted imaging in the medial aspect of the right occipital lobe, favored to represent an additional site of an acute to subacute infarct. Repeat CT 3/1 Expected CT appearance of Left cerebellum PICA infarct.  2D Echo: LVEF 70-75% LDL 122 HgbA1c: 5.6 VTE prophylaxis  - lovenox No antithrombotic prior to admission, was on aspirin 81 mg daily and clopidogrel 75 mg daily, will resume ASA 81 today and avoid DAPT given GIB and per GI recommendation.  Therapy recommendations:  AIR Disposition:  pending  Lethargy  Leukocytosis Acute blood loss anemia GI bleeding Persistent lethargy, improving Likely from severe anemia and GIB WBC 17.8->13.8->20.4->14.5->14.0->13.4 Hemoglobin 14.6->11.2->7.8->6.7->PRBC->10.2->9.9 Afebrile UA negative CXR negative Stool occult blood positive CT abd/pelvis -  an area of transmural ulceration versus small duodenal diverticulum. No sign of perforation. GI consulted EGD showed several non-bleeding duodenal ulcer but one has evidence of recent bleeding s/p Purastat On protonix IV On ASA 81 and avoid DAPT  Hypertension Home meds:  Toprol-XL '25mg'$ , Norvasc '10mg'$ , Hydralazine '50mg'$ , HCTZ '25mg'$ . Stable but fluctuate On amoldipine and metoprolol Long-term BP goal normotensive  Hyperlipidemia Home meds:  none LDL 122, goal < 70 Add Lipitor '40mg'$  Continue statin at discharge  Other Stroke Risk Factors Advanced Age >/= 58   Other Active Problems Nausea Zofran, Reglan added PRN Hx of syncope Previous Holter Monitor monitoring 12/2021 for syncope: NSR with episodes of tachycardia up to 120. Toprol-XL '25mg'$  daily added after this. Hx of chronic neck pain Hx of Breast Cancer Hypokalemia - on supplement  Hospital day # 9  Neurology will sign off. Please call with questions. Pt will follow up with stroke clinic NP at [facility] in about 4 weeks. Thanks for the consult.   Rosalin Hawking, MD PhD Stroke Neurology 05/04/2022 11:34 AM   To contact Stroke Continuity provider, please refer to [email or web address] After hours, contact General Neurology

## 2022-05-04 NOTE — Progress Notes (Signed)
Speech Language Pathology Treatment: Cognitive-Linquistic  Patient Details Name: Sherry [name] MRN: [id number] DOB: April 07, 1930 Today's Date: 05/04/2022 Time: [id number] SLP Time Calculation (min) (ACUTE ONLY): 13 min  Assessment / Plan / Recommendation Clinical Impression  Pt states that she feels as though cognition has improved since admission, but has not returned to baseline. She confirms handling all finances and medication independently PTA. Pt was agreeable to completing a functional cognitive task related to finances. SLP asked pt to identify various parts of a receipt and do basic problem-solving. She correctly answered 5/6 (83%) questions and achieved 100% accuracy given verbal and visual cueing. She appeared to have improved attention and overall awareness during the session compared to the initial evaluation. Pt will likely benefit from continued therapy after d/c for functional cognitive tasks. SLP will f/u as able during acute stay to address problem-solving, attention, and awareness through functional treatment activities.    HPI HPI: Pt is an 89 yo female presenting to ED with reports of nausea, vomiting, and headache. MRI Brain showed an acute infarct in the medial aspect of the L cerebellar hemisphere. PMH includes HTN, SDH, multiple falls, and squamous cell carcinoma.      SLP Plan  Continue with current plan of care      Recommendations for follow up therapy are one component of a multi-disciplinary discharge planning process, led by the attending physician.  Recommendations may be updated based on patient status, additional functional criteria and insurance authorization.    Recommendations                   Oral Care Recommendations: Oral care BID Follow Up Recommendations: Skilled nursing-short term rehab (<3 hours/day) Assistance recommended at discharge: Frequent or constant Supervision/Assistance SLP Visit Diagnosis: Cognitive communication deficit  [id number]) Plan: Continue with current plan of care        Fabio Asa., Student SLP  05/04/2022, 4:05 PM

## 2022-05-04 NOTE — Plan of Care (Signed)
  Problem: Fluid Volume: Goal: Hemodynamic stability will improve Outcome: Progressing   Problem: Clinical Measurements: Goal: Diagnostic test results will improve Outcome: Progressing Goal: Signs and symptoms of infection will decrease Outcome: Progressing   Problem: Education: Goal: Knowledge of disease or condition will improve Outcome: Progressing Goal: Knowledge of secondary prevention will improve (MUST DOCUMENT ALL) Outcome: Progressing Goal: Knowledge of patient specific risk factors will improve [place] N/A or DELETE if not current risk factor) Outcome: Progressing   Problem: Ischemic Stroke/TIA Tissue Perfusion: Goal: Complications of ischemic stroke/TIA will be minimized Outcome: Progressing   Problem: Coping: Goal: Will verbalize positive feelings about self Outcome: Progressing Goal: Will identify appropriate support needs Outcome: Progressing   Problem: Health Behavior/Discharge Planning: Goal: Ability to manage health-related needs will improve Outcome: Progressing   Problem: Self-Care: Goal: Ability to participate in self-care as condition permits will improve Outcome: Progressing Goal: Verbalization of feelings and concerns over difficulty with self-care will improve Outcome: Progressing   Problem: Nutrition: Goal: Risk of aspiration will decrease Outcome: Progressing Goal: Dietary intake will improve Outcome: Progressing   Problem: Health Behavior/Discharge Planning: Goal: Ability to manage health-related needs will improve Outcome: Progressing   Problem: Clinical Measurements: Goal: Ability to maintain clinical measurements within normal limits will improve Outcome: Progressing Goal: Will remain free from infection Outcome: Progressing Goal: Diagnostic test results will improve Outcome: Progressing Goal: Cardiovascular complication will be avoided Outcome: Progressing   Problem: Activity: Goal: Risk for activity intolerance will  decrease Outcome: Progressing   Problem: Nutrition: Goal: Adequate nutrition will be maintained Outcome: Progressing   Problem: Pain Managment: Goal: General experience of comfort will improve Outcome: Progressing   Problem: Safety: Goal: Ability to remain free from injury will improve Outcome: Progressing   Problem: Skin Integrity: Goal: Risk for impaired skin integrity will decrease Outcome: Progressing

## 2022-05-04 NOTE — Progress Notes (Signed)
Occupational Therapy Treatment Patient Details Name: Sherry [name] MRN: [id number] DOB: [birth year] Today's Date: 05/04/2022   History of present illness Pt is an 89 yo female presenting 2/25 with nausea and headache, CT angiogram showed occlusion of the left vertebral artery and MRI showed left cerebellar stroke with a possible small area of restricted diffusion in the right occipital lobe. PMH includes: uncontrolled HTN, and recent fall with concussion.   OT comments  Patient supine in bed and with encouragement agreeable to OT session.  Utilized focused gaze throughout mobilization with cueing and increased time for initiation, sequencing and problem solving. Up to max assist +2 for bed mobility, L lateral lean at EOB with cueing pt able to self correct to midline (at best min guard briefly) and max assist +2 to stand at EOB and step towards HOB. She is very weak and reports lightheadedness after standing.  Back to supine with BP stable, but would recommend orthostatics next session. Noted LE with increased weakness (3-/5 MMT) and decreased coordination (especially distally) when attempting to use functionally compared to session on 2/26, pt reporting "I can't move my arm".  Encouraged self AAROM to increased strength and RN notified.  Will follow acutely, updated dc plan to SNF.    Recommendations for follow up therapy are one component of a multi-disciplinary discharge planning process, led by the attending physician.  Recommendations may be updated based on patient status, additional functional criteria and insurance authorization.    Follow Up Recommendations  Skilled nursing-short term rehab (<3 hours/day)     Assistance Recommended at Discharge Frequent or constant Supervision/Assistance  Patient can return home with the following  Two people to help with walking and/or transfers;A lot of help with bathing/dressing/bathroom;Assistance with cooking/housework;Direct supervision/assist for  medications management;Direct supervision/assist for financial management;Assist for transportation;Help with stairs or ramp for entrance   Equipment Recommendations  Other (comment) (defer)    Recommendations for Other Services      Precautions / Restrictions Precautions Precautions: Fall Precaution Comments: dizziness Restrictions Weight Bearing Restrictions: No       Mobility Bed Mobility Overal bed mobility: Needs Assistance Bed Mobility: Rolling, Sidelying to Sit, Sit to Sidelying Rolling: Mod assist Sidelying to sit: Max assist, HOB elevated, +2 for safety/equipment     Sit to sidelying: Max assist, +2 for physical assistance, +2 for safety/equipment, HOB elevated General bed mobility comments: pt requiring cueing for sequencing and technique, mod assist for LEs towards EOB and max assist to ascend trunk    Transfers Overall transfer level: Needs assistance Equipment used: 2 person hand held assist Transfers: Sit to/from Stand Sit to Stand: Max assist, +2 physical assistance, +2 safety/equipment           General transfer comment: max +2 to power up and stedy with bilateral hand held support     Balance Overall balance assessment: Needs assistance Sitting-balance support: No upper extremity supported, Bilateral upper extremity supported, Single extremity supported, Feet supported Sitting balance-Leahy Scale: Poor Sitting balance - Comments: pt with L lateral lean, up to max assist initally but with cueing able to self correct with moments of min guard. Postural control: Left lateral lean, Posterior lean Standing balance support: Bilateral upper extremity supported, During functional activity Standing balance-Leahy Scale: Poor Standing balance comment: relies on external support                           ADL either performed or assessed with clinical judgement  ADL Overall ADL's : Needs assistance/impaired                 Upper Body  Dressing : Moderate assistance;Sitting   Lower Body Dressing: Total assistance;+2 for physical assistance;+2 for safety/equipment;Sit to/from stand   Toilet Transfer: Maximal assistance;+2 for physical assistance;+2 for safety/equipment Toilet Transfer Details (indicate cue type and reason): simulated side stepping to [facility]         Functional mobility during ADLs: Maximal assistance;+2 for physical assistance;+2 for safety/equipment General ADL Comments: improved dizziness but very weak with impaired balance    Extremity/Trunk Assessment Upper Extremity Assessment Upper Extremity Assessment: LUE deficits/detail;Generalized weakness LUE Deficits / Details: pt reporting "I cant use it", able to move UE with support and cueing for specific movements but noted poor coordination and control. LUE Coordination: decreased fine motor;decreased gross motor   Lower Extremity Assessment Lower Extremity Assessment: Defer to PT evaluation        Vision       Perception     Praxis      Cognition Arousal/Alertness: Awake/alert Behavior During Therapy: Flat affect Overall Cognitive Status: Impaired/Different from baseline Area of Impairment: Problem solving, Awareness, Attention, Following commands                   Current Attention Level: Sustained   Following Commands: Follows one step commands consistently, Follows one step commands with increased time, Follows multi-step commands inconsistently   Awareness: Emergent Problem Solving: Slow processing, Decreased initiation, Difficulty sequencing, Requires verbal cues, Requires tactile cues General Comments: pt requires encouragement, requires increased time for initation and sequencing.  Decreased problem solving. Remains oriented.        Exercises Exercises: Other exercises Other Exercises Other Exercises: AAROM with R UE for shoulder/eblow flexion to L UE x 5 reps. Encouraged completion of exercise throughout the day.     Shoulder Instructions       General Comments pt with much improved dizziness, continued to encouraged focused gaze with mobilization.  Pt reports feeling "weak" but deines dizziness at EOB.  Stepped towards [facility] and then reports feeling "lightheaded".  Once supine BP assessed at 144/66. Also encouraged upright in bed position for increased tolerance.    Pertinent Vitals/ Pain       Pain Assessment Pain Assessment: Faces Faces Pain Scale: No hurt  Home Living                                          Prior Functioning/Environment              Frequency  Min 2X/week        Progress Toward Goals  OT Goals(current goals can now be found in the care plan section)  Progress towards OT goals: Progressing toward goals  Acute Rehab OT Goals Patient Stated Goal: feel better OT Goal Formulation: With patient Time For Goal Achievement: 05/10/22 Potential to Achieve Goals: Good  Plan Frequency remains appropriate;Discharge plan needs to be updated    Co-evaluation                 AM-PAC OT "6 Clicks" Daily Activity     Outcome Measure   Help from another person eating meals?: A Little Help from another person taking care of personal grooming?: A Little Help from another person toileting, which includes using toliet, bedpan, or urinal?: Total Help from another person  bathing (including washing, rinsing, drying)?: A Lot Help from another person to put on and taking off regular upper body clothing?: A Lot Help from another person to put on and taking off regular lower body clothing?: Total 6 Click Score: 12    End of Session    OT Visit Diagnosis: Other abnormalities of gait and mobility (R26.89);Muscle weakness (generalized) (M62.81);Dizziness and giddiness (R42);Other symptoms and signs involving the nervous system (R29.898)   Activity Tolerance Patient tolerated treatment well   Patient Left in bed;with call bell/phone within reach;with bed  alarm set   Nurse Communication Mobility status        Time: [id number] OT Time Calculation (min): 21 min  Charges: OT General Charges $OT Visit: 1 Visit OT Treatments $Self Care/Home Management : 8-22 mins  Jolaine Artist, OT Acute Rehabilitation Services Office [phone]   Sherry [name] 05/04/2022, 2:00 PM

## 2022-05-05 ENCOUNTER — Encounter (HOSPITAL_COMMUNITY): Payer: Self-pay | Admitting: Pulmonary Disease

## 2022-05-05 ENCOUNTER — Other Ambulatory Visit: Payer: Self-pay

## 2022-05-05 ENCOUNTER — Inpatient Hospital Stay (HOSPITAL_COMMUNITY): Payer: Medicare Other

## 2022-05-05 LAB — BASIC METABOLIC PANEL
Anion gap: 10 (ref 5–15)
BUN: 5 mg/dL — ABNORMAL LOW (ref 8–23)
CO2: 21 mmol/L — ABNORMAL LOW (ref 22–32)
Calcium: 8.1 mg/dL — ABNORMAL LOW (ref 8.9–10.3)
Chloride: 107 mmol/L (ref 98–111)
Creatinine, Ser: 0.5 mg/dL (ref 0.44–1.00)
GFR, Estimated: >60 mL/min (ref >60)
Glucose, Bld: 95 mg/dL (ref 70–99)
Potassium: 4 mmol/L (ref 3.5–5.1)
Sodium: 138 mmol/L (ref 135–145)

## 2022-05-05 LAB — GLUCOSE, CAPILLARY
Glucose-Capillary: 95 mg/dL (ref 70–99)
Glucose-Capillary: 91 mg/dL (ref 70–99)
Glucose-Capillary: 113 mg/dL — ABNORMAL HIGH (ref 70–99)
Glucose-Capillary: 132 mg/dL — ABNORMAL HIGH (ref 70–99)

## 2022-05-05 LAB — HEMOGLOBIN: Hemoglobin: 10.1 g/dL — ABNORMAL LOW (ref 12.0–15.0)

## 2022-05-05 MED ORDER — PANTOPRAZOLE INFUSION (NEW) - SIMPLE MED
8.0000 mg/h | INTRAVENOUS | Status: AC
  Administered 2022-05-05 – 2022-05-06 (×2): 8 mg/h via INTRAVENOUS
  Filled 2022-05-05 (×2): qty 100

## 2022-05-05 MED ORDER — DIAZEPAM 2 MG PO TABS: 2.0000 mg | ORAL_TABLET | Freq: Three times a day (TID) | ORAL | Status: DC | PRN

## 2022-05-05 MED ORDER — METOCLOPRAMIDE HCL 10 MG PO TABS
5.0000 mg | ORAL_TABLET | Freq: Three times a day (TID) | ORAL | Status: DC
  Administered 2022-05-05 – 2022-05-07 (×5): 5 mg via ORAL
  Filled 2022-05-05 (×6): qty 1

## 2022-05-05 MED ORDER — POLYETHYLENE GLYCOL 3350 17 G PO PACK
17.0000 g | PACK | Freq: Every day | ORAL | Status: DC
  Administered 2022-05-05 – 2022-05-12 (×8): 17 g via ORAL
  Filled 2022-05-05 (×8): qty 1

## 2022-05-05 NOTE — Progress Notes (Addendum)
Physical Therapy Treatment Patient Details Name: DUANNE KOVER MRN: [id number] DOB: 1931/01/23 Today's Date: 05/05/2022   History of Present Illness Pt is an 89 yo female presenting 2/25 with nausea and headache, CT angiogram showed occlusion of the left vertebral artery and MRI showed left cerebellar stroke with a possible small area of restricted diffusion in the right occipital lobe. PMH includes: uncontrolled HTN, and recent fall with concussion.    PT Comments    Initial treatment session limited. Pt able to participate in a few bed level exercises for BLE strengthening/ROM, but then deferring further mobility due to increased nausea. RN notified for medication to address nausea. Pt asking PT to come back later; please refer to following progress note for further information regarding progress towards mobility goals.    Recommendations for follow up therapy are one component of a multi-disciplinary discharge planning process, led by the attending physician.  Recommendations may be updated based on patient status, additional functional criteria and insurance authorization.  Follow Up Recommendations  Skilled nursing-short term rehab (<3 hours/day) Can patient physically be transported by private vehicle: No   Assistance Recommended at Discharge Frequent or constant Supervision/Assistance  Patient can return home with the following Two people to help with walking and/or transfers;Two people to help with bathing/dressing/bathroom;Assistance with cooking/housework;Direct supervision/assist for medications management;Assistance with feeding;Direct supervision/assist for financial management;Assist for transportation;Help with stairs or ramp for entrance   Equipment Recommendations  Other (comment) (defer to post acute)    Recommendations for Other Services       Precautions / Restrictions Precautions Precautions: Fall Precaution Comments: dizziness Restrictions Weight Bearing  Restrictions: No     Mobility  Bed Mobility               General bed mobility comments: pt deferred    Transfers                        Ambulation/Gait                   Stairs             Wheelchair Mobility    Modified Rankin (Stroke Patients Only)       Balance                                            Cognition Arousal/Alertness: Awake/alert Behavior During Therapy: Flat affect Overall Cognitive Status: Impaired/Different from baseline Area of Impairment: Problem solving, Awareness, Attention, Following commands                   Current Attention Level: Sustained   Following Commands: Follows one step commands consistently, Follows one step commands with increased time, Follows multi-step commands inconsistently   Awareness: Emergent Problem Solving: Slow processing, Decreased initiation, Difficulty sequencing, Requires verbal cues, Requires tactile cues General Comments: pt requires encouragement, requires increased time for initation and sequencing.  Decreased problem solving. Remains oriented.        Exercises General Exercises - Lower Extremity Ankle Circles/Pumps: AAROM, Both, 10 reps, Supine Heel Slides: AROM, Both, 10 reps, Supine Straight Leg Raises: AROM, Both, 10 reps, Supine    General Comments        Pertinent Vitals/Pain Pain Assessment Pain Assessment: Faces Faces Pain Scale: No hurt    Home Living  Prior Function            PT Goals (current goals can now be found in the care plan section) Acute Rehab PT Goals Potential to Achieve Goals: Fair    Frequency    Min 3X/week      PT Plan Current plan remains appropriate    Co-evaluation              AM-PAC PT "6 Clicks" Mobility   Outcome Measure  Help needed turning from your back to your side while in a flat bed without using bedrails?: A Lot Help needed moving from  lying on your back to sitting on the side of a flat bed without using bedrails?: A Lot Help needed moving to and from a bed to a chair (including a wheelchair)?: Total Help needed standing up from a chair using your arms (e.g., wheelchair or bedside chair)?: Total Help needed to walk in hospital room?: Total Help needed climbing 3-5 steps with a railing? : Total 6 Click Score: 8    End of Session   Activity Tolerance: Other (comment) (limited by nausea) Patient left: in bed;with call bell/phone within reach;with bed alarm set Nurse Communication: Mobility status PT Visit Diagnosis: Other abnormalities of gait and mobility (R26.89);Muscle weakness (generalized) (M62.81);History of falling (Z91.81);Difficulty in walking, not elsewhere classified (R26.2);Unsteadiness on feet (R26.81);Dizziness and giddiness (R42)     Time: [id number] PT Time Calculation (min) (ACUTE ONLY): 15 min  Charges:  $Therapeutic Exercise: 8-22 mins                     [name], PT, DPT Acute Rehabilitation Services Office [place] 05/05/2022, 4:08 PM

## 2022-05-05 NOTE — Plan of Care (Signed)
  Problem: Fluid Volume: Goal: Hemodynamic stability will improve Outcome: Progressing   Problem: Clinical Measurements: Goal: Diagnostic test results will improve Outcome: Progressing Goal: Signs and symptoms of infection will decrease Outcome: Progressing   Problem: Education: Goal: Knowledge of disease or condition will improve Outcome: Progressing Goal: Knowledge of secondary prevention will improve (MUST DOCUMENT ALL) Outcome: Progressing Goal: Knowledge of patient specific risk factors will improve [place] N/A or DELETE if not current risk factor) Outcome: Progressing   Problem: Ischemic Stroke/TIA Tissue Perfusion: Goal: Complications of ischemic stroke/TIA will be minimized Outcome: Progressing   Problem: Coping: Goal: Will verbalize positive feelings about self Outcome: Progressing Goal: Will identify appropriate support needs Outcome: Progressing   Problem: Health Behavior/Discharge Planning: Goal: Ability to manage health-related needs will improve Outcome: Progressing   Problem: Self-Care: Goal: Ability to participate in self-care as condition permits will improve Outcome: Progressing Goal: Verbalization of feelings and concerns over difficulty with self-care will improve Outcome: Progressing   Problem: Nutrition: Goal: Risk of aspiration will decrease Outcome: Progressing Goal: Dietary intake will improve Outcome: Progressing   Problem: Health Behavior/Discharge Planning: Goal: Ability to manage health-related needs will improve Outcome: Progressing   Problem: Clinical Measurements: Goal: Ability to maintain clinical measurements within normal limits will improve Outcome: Progressing Goal: Will remain free from infection Outcome: Progressing Goal: Diagnostic test results will improve Outcome: Progressing Goal: Cardiovascular complication will be avoided Outcome: Progressing   Problem: Activity: Goal: Risk for activity intolerance will  decrease Outcome: Progressing   Problem: Nutrition: Goal: Adequate nutrition will be maintained Outcome: Progressing   Problem: Pain Managment: Goal: General experience of comfort will improve Outcome: Progressing   Problem: Safety: Goal: Ability to remain free from injury will improve Outcome: Progressing   Problem: Skin Integrity: Goal: Risk for impaired skin integrity will decrease Outcome: Progressing

## 2022-05-05 NOTE — Progress Notes (Signed)
Physical Therapy Treatment Patient Details Name: Sherry [name] MRN: [id number] DOB: [birth year] Today's Date: 05/05/2022   History of Present Illness Pt is an 89 yo female presenting 2/25 with nausea and headache, CT angiogram showed occlusion of the left vertebral artery and MRI showed left cerebellar stroke with a possible small area of restricted diffusion in the right occipital lobe. PMH includes: uncontrolled HTN, and recent fall with concussion.    PT Comments    Pt agreeable to participate in second session with encouragement. Session focused on bed mobility, sitting balance and pre transfer training. Emphasized continued gaze stabilization with transitions. Pt requiring up to two person mod-max assist for functional mobility. Performed two trials of standing with use of [place]. Demonstrates left lateral lean. Discussed expected progression and role of therapy. Pt would benefit from post acute rehab to address deficits and maximize functional mobility.    Recommendations for follow up therapy are one component of a multi-disciplinary discharge planning process, led by the attending physician.  Recommendations may be updated based on patient status, additional functional criteria and insurance authorization.  Follow Up Recommendations  Skilled nursing-short term rehab (<3 hours/day) Can patient physically be transported by private vehicle: No   Assistance Recommended at Discharge Frequent or constant Supervision/Assistance  Patient can return home with the following Two people to help with walking and/or transfers;Two people to help with bathing/dressing/bathroom;Assistance with cooking/housework;Direct supervision/assist for medications management;Assistance with feeding;Direct supervision/assist for financial management;Assist for transportation;Help with stairs or ramp for entrance   Equipment Recommendations  Other (comment) (defer to post acute)    Recommendations for Other  Services       Precautions / Restrictions Precautions Precautions: Fall Precaution Comments: dizziness Restrictions Weight Bearing Restrictions: No     Mobility  Bed Mobility Overal bed mobility: Needs Assistance Bed Mobility: Supine to Sit, Sit to Supine, Rolling Rolling: Mod assist   Supine to sit: +2 for physical assistance, Mod assist Sit to supine: Max assist, +2 for physical assistance   General bed mobility comments: Pt able to bring BLE's off edge of bed, assist at trunk to elevate and use of bed pad to pivot R hip to edge of bed. Increased assist for return to bed    Transfers Overall transfer level: Needs assistance Equipment used: Ambulation equipment used Transfers: Sit to/from Stand Sit to Stand: Mod assist, +2 physical assistance, Max assist           General transfer comment: ModA + 2 to power up to [place], up to maxA to maintain standing balance due to left lateral lean    Ambulation/Gait                   Stairs             Wheelchair Mobility    Modified Rankin (Stroke Patients Only)       Balance Overall balance assessment: Needs assistance Sitting-balance support: Feet supported Sitting balance-Leahy Scale: Poor Sitting balance - Comments: Left lateral lean, up to max assist                                    Cognition Arousal/Alertness: Awake/alert Behavior During Therapy: Flat affect Overall Cognitive Status: Impaired/Different from baseline Area of Impairment: Problem solving, Awareness, Attention, Following commands                   Current Attention Level: Sustained  Following Commands: Follows one step commands consistently, Follows one step commands with increased time, Follows multi-step commands inconsistently   Awareness: Emergent Problem Solving: Slow processing, Decreased initiation, Difficulty sequencing, Requires verbal cues, Requires tactile cues General Comments: pt requires  encouragement, requires increased time for initation and sequencing.  Decreased problem solving. Remains oriented.        Exercises General Exercises - Lower Extremity Ankle Circles/Pumps: AAROM, Both, 10 reps, Supine Heel Slides: AROM, Both, 10 reps, Supine Straight Leg Raises: AROM, Both, 10 reps, Supine    General Comments        Pertinent Vitals/Pain Pain Assessment Pain Assessment: Faces Faces Pain Scale: No hurt    Home Living                          Prior Function            PT Goals (current goals can now be found in the care plan section) Acute Rehab PT Goals Potential to Achieve Goals: Fair    Frequency    Min 3X/week      PT Plan Current plan remains appropriate    Co-evaluation              AM-PAC PT "6 Clicks" Mobility   Outcome Measure  Help needed turning from your back to your side while in a flat bed without using bedrails?: A Lot Help needed moving from lying on your back to sitting on the side of a flat bed without using bedrails?: A Lot Help needed moving to and from a bed to a chair (including a wheelchair)?: Total Help needed standing up from a chair using your arms (e.g., wheelchair or bedside chair)?: Total Help needed to walk in hospital room?: Total Help needed climbing 3-5 steps with a railing? : Total 6 Click Score: 8    End of Session   Activity Tolerance: Other (comment) (limited by nausea) Patient left: in bed;with call bell/phone within reach;with bed alarm set Nurse Communication: Mobility status PT Visit Diagnosis: Other abnormalities of gait and mobility (R26.89);Muscle weakness (generalized) (M62.81);History of falling (Z91.81);Difficulty in walking, not elsewhere classified (R26.2);Unsteadiness on feet (R26.81);Dizziness and giddiness (R42)     Time: [id number] PT Time Calculation (min) (ACUTE ONLY): 27 min  Charges:  $Therapeutic Exercise: 8-22 mins $Therapeutic Activity: 23-37 mins                      [name], PT, DPT Acute Rehabilitation Services Office [phone]    Deno Etienne 05/05/2022, 4:16 PM

## 2022-05-05 NOTE — Progress Notes (Signed)
Progress Note   Subjective  NO bleeding symptoms. She is feeling better. No pain. Has an appetite.   Objective   Vital signs in last 24 hours: Temp:  [98 F (36.7 C)-99.1 F (37.3 C)] 98 F (36.7 C) (03/06 0814) Pulse Rate:  [64-73] 64 (03/06 0814) Resp:  [15-18] 18 (03/06 0814) BP: (129-161)/(52-64) 161/63 (03/06 0814) SpO2:  [96 %-99 %] 99 % (03/06 0814) Last BM Date : 05/04/22 General:    white female in NAD Neurologic:  Alert and oriented,  grossly normal neurologically. Psych:  Cooperative. Normal mood and affect.  Intake/Output from previous day: 03/05 0701 - 03/06 0700 In: 120 [P.O.:120] Out: 3250 [Urine:3250] Intake/Output this shift: No intake/output data recorded.  Lab Results: Recent Labs    05/02/22 2037 05/03/22 0451 05/04/22 0803  WBC  --  14.0* 13.4*  HGB 10.0* 10.2* 9.9*  HCT 28.4* 29.0* 28.5*  PLT  --  261 306   BMET Recent Labs    05/03/22 0451 05/04/22 0803 05/04/22 1624 05/05/22 0732  NA 137 134*  --  138  K 3.0* 2.9* 3.3* 4.0  CL 108 103  --  107  CO2 21* 22  --  21*  GLUCOSE 103* 191*  --  95  BUN 14 10  --  5*  CREATININE 0.43* 0.57  --  0.50  CALCIUM 7.9* 7.9*  --  8.1*   LFT No results for input(s): "PROT", "ALBUMIN", "AST", "ALT", "ALKPHOS", "BILITOT", "BILIDIR", "IBILI" in the last 72 hours. PT/INR No results for input(s): "LABPROT", "INR" in the last 72 hours.  Studies/Results: No results found.     Assessment / Plan:    89 y/o female here with the following:   Upper GI bleed in the setting of antiplatelet use secondary to PUD Anemia Recent CVA   EGD 3/4 showed esophageal ulcers, gastric ulcers, and numerous duodenal ulcers. All clean based with exception of one largest duodenal ulcer which was deeply cratered with a red spot / suspected vessel. Attempts at hemostasis clip not successful (rather cratered and difficult to adhere to the mucosa - caused mucosal trauma), treated with Purastat with good result.  She has had a downtrending BUN, no overt bleeding. Hgb pending for this AM.  Biopsies taken from the procedure show NO evidence of H pylori  We have continued aspirin. I think from recent CVA perspective, given she has had no further bleeding, could cautiously resume Plavix today. I have discussed this with the patient - risks / benefits - if Neurology wants her back on this I would resume it and monitor for rebleeding.    Okay to continue aspirin right now. Plavix is still in her system from 3/2. If she does well today then perhaps can consider resuming Plavix in next 24 hours given recent CVA. Awaiting pathology results, rule out H pylori. Can advance diet to full liquids if she has no diet restrictions from CVA. Continue protonix drip through tomorrow AM and can then transition to protonix '40mg'$  PO BID. Would take that dosing for 4 weeks and then transition to once daily indefinitely if she will be on long term aspirin and Plavix.   She can follow up with [place] post hospitalization otherwise.   RECOMMEND: - soft diet today - continue IV Protonix drip for 72 hours from time of EGD (through tomorrow AM), then transition to '40mg'$  BID for 4 weeks, and then once daily thereafter indefinitely if she needs to stay on aspirin and  Plavix - continue aspirin - can resume Plavix today as above if recommended per Neurology - monitor for bleeding symptoms, trend Hgb  Call with questions, we will sign off for now. We can coordinate outpatient follow up after her discharge. Given comorbidities likely would not plan for follow up endoscopy, gastric ulcers were small.  Jolly Mango, MD [facility]

## 2022-05-05 NOTE — Progress Notes (Signed)
[facility] Progress Note Patient: Sherry [name] [id number] DOB: [birth year] DOA: 04/25/2022  DOS: the patient was seen and examined on 05/05/2022  Brief hospital course: Sherry [name] is a 89 y.o. female with a history of hypertension, recurrent falls, breast cancer status postlumpectomy and radiation.  Patient presented secondary to headache, nausea, vomiting with associated dizziness.  MRI imaging confirmed presence of acute cerebellar infarct and CT angiogram of the head and neck was significant for left vertebral artery occlusion.  Patient was admitted for hypertensive emergency and acute stroke, initially requiring ICU admission and Cleviprex drip.  For antihypertensives restarted and Cleviprex weaned off.  Patient with intractable vertigo/nausea secondary to acute infarct.  Physical therapy recommending acute inpatient rehab on discharge, however no plan for discharge to SNF. 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 MRI confirms acute infarct of medial aspect of left cerebellar hemisphere in addition to likely acute/subacute infarct of the medial aspect of the right occipital lobe. CTA head and neck significant for an occluded left vertebral artery in the V2 segment. LDL of 122. Hemoglobin A1C of 5.6%. Transthoracic Echocardiogram significant for no atrial level shunt. Neurology recommendations for aspirin indefinitely and Plavix 75 mg for three months. Plavix is on hold due to GI bleed. PT/OT recommendations for acute inpatient rehabilitation, now with plan to discharge to SNF.   Hypertensive emergency Per history, patient's outpatient antihypertensives discontinued in December. Patient managed initially on Cleviprex and able to wean off. Hydralazine held secondary to GI bleeding. Low-normal/normotensive -Continue amlodipine 10 mg daily and metoprolol -Hold hydralazine   Acute GI bleeding Patient with  acute blood loss anemia with FOBT positive stool. Associated elevated BUN. GI consulted. CT abdomen/pelvis obtained and suggests possible ulceration of the pylorus/descending portion of duodenum. Upper endoscopy performed on 3/4 and  was significant for esophageal/gastric and duodenal ulcers; one duodenal ulcer with stigmata of recent bleeding which was treated with Purastat. -GI recommendations: Full liquid diet, continue aspirin, hold Plavix. -Protonix drip until tomorrow.   Acute blood loss anemia Secondary to GI bleeding. Hemoglobin of 14.6 on admission. Hemoglobin down to 6.7 today. Patient consented for blood transfusion. Hemoglobin up to 10 and stable. -Daily CBC   Abdominal pain Unclear etiology but concern this is likely related to GI bleeding and anemia. Stat CT abdomen/pelvis obtained and confirmed concern for gastric/duodenal ulcer without evidence of perforation. GI consulted for GI bleeding. X-ray abdomen unremarkable.   Leukocytosis No source for infection noted; urinalysis not suggestive of infection. Chest x-ray without cardiopulmonary disease. Initial leukocytosis and downtrend, now with elevation with peak of 20,400 predominantly secondary to neutrophilia. WBC down to 14,000 today.   Hypokalemia -potassium supplementation   Acute urinary retention Unclear etiology. Possibly related to patient essentially being bed-bound. Foley catheter placed on 3/2 secondary to severe urinary retention. -Continue urinary catheter for 5-7 days post insertion prior to attempting voiding trial, unless mobility significant improves   Lactic acidosis Present on admission. Improved with IV fluids.   Intractable nausea/vomiting Intractable vertigo Secondary to acute stroke. Therapies have not been successful in treating symptoms -Continue Reglan scheduled and meclizine scheduled -Zofran PRN, Valium as needed.   Right breast nodule/mass 19 mm mixed density nodule/mass noted on CT chest.  Patient with a history of right breast cancer s/p lumpectomy. Patient will need follow-up for nodule with mammography.   Subjective: Reports some hiccups.  Still has weakness.  Reports severe vertigo as well as nausea while working with  physical therapy.  No fever no chills.  Passing gas but no BM.  Physical Exam: General: in Mild distress, No Rash Cardiovascular: S1 and S2 Present, No Murmur Respiratory: Good respiratory effort, Bilateral Air entry present. No Crackles, No wheezes Abdomen: Bowel Sound present, No tenderness Extremities: No edema Neuro: Alert and oriented x3, no new focal deficit, no aphasia.  Left-sided ataxia no facial droop.  Data Reviewed: I have Reviewed nursing notes, Vitals, and Lab results. Since last encounter, pertinent lab results CBC and BMP   . I have ordered test including CBC and BMP  . I have discussed pt's care plan and test results with GI and neurology  . I have ordered imaging x-ray abdomen  .  Disposition: Status is: Inpatient Remains inpatient appropriate because: Need for further workup and improvement in symptoms  Place and maintain sequential compression device Start: 05/02/22 1103 SCDs Start: 04/25/22 1809   Family Communication: No one at bedside Level of care: Telemetry Medical continue indefinite Vitals:   05/04/22 2323 05/05/22 0344 05/05/22 0814 05/05/22 1126  BP: (!) 158/58 (!) 148/64 (!) 161/63 (!) 159/58  Pulse: 68 64 64 67  Resp: '18 16 18 18  '$ Temp: 98.7 F (37.1 C) 98.7 F (37.1 C) 98 F (36.7 C) 99.2 F (37.3 C)  TempSrc: Axillary Oral Oral Oral  SpO2: 97% 97% 99% 98%  Weight:    58.5 kg  Height:    '5\' 2"'$  (1.575 m)     Author: Berle Mull, MD 05/05/2022 6:54 PM  Please look on [email or web address] to find out who is on call.

## 2022-05-05 NOTE — TOC Progression Note (Signed)
Transition of Care [facility]) - Progression Note    Patient Details  Name: DENYCE ADDICKS MRN: [id number] Date of Birth: 03-14-1930  Transition of Care [facility]) CM/SW [place], [place] Phone Number: 05/05/2022, 12:13 PM  Clinical Narrative:     CSW received call from pt's son Valinda Party. Requesting update regarding SNF. He confirmed desire for [facility]. CSW updated that he will request [facility] review once PT see's pt today hopefully demonstrating improvement.   Expected Discharge Plan: [place] Barriers to Discharge: Continued Medical Work up  Expected Discharge Plan and [place] Choice: [place] arrangements for the past 2 months: [place] Determinants of Health (SDOH) Interventions [place]: No Food Insecurity (01/07/2022)  Housing: Low Risk  (01/07/2022)  Transportation Needs: No Transportation Needs (01/07/2022)  Utilities: Not At Risk (01/07/2022)  Tobacco Use: Low Risk  (05/03/2022)    Readmission Risk Interventions    01/07/2022    2:50 PM  Readmission Risk Prevention Plan  Post Dischage Appt Complete  Medication Screening Complete  Transportation Screening Complete

## 2022-05-06 ENCOUNTER — Encounter (HOSPITAL_COMMUNITY): Payer: Self-pay | Admitting: Gastroenterology

## 2022-05-06 LAB — CBC
HCT: 29.3 % — ABNORMAL LOW (ref 36.0–46.0)
Hemoglobin: 10.1 g/dL — ABNORMAL LOW (ref 12.0–15.0)
MCH: 31.2 pg (ref 26.0–34.0)
MCHC: 34.5 g/dL (ref 30.0–36.0)
MCV: 90.4 fL (ref 80.0–100.0)
Platelets: 327 10*3/uL (ref 150–400)
RBC: 3.24 MIL/uL — ABNORMAL LOW (ref 3.87–5.11)
RDW: 13.5 % (ref 11.5–15.5)
WBC: 11.4 10*3/uL — ABNORMAL HIGH (ref 4.0–10.5)
nRBC: 0 % (ref 0.0–0.2)

## 2022-05-06 LAB — BASIC METABOLIC PANEL
Anion gap: 9 (ref 5–15)
BUN: 7 mg/dL — ABNORMAL LOW (ref 8–23)
CO2: 24 mmol/L (ref 22–32)
Calcium: 8.4 mg/dL — ABNORMAL LOW (ref 8.9–10.3)
Chloride: 101 mmol/L (ref 98–111)
Creatinine, Ser: 0.6 mg/dL (ref 0.44–1.00)
GFR, Estimated: >60 mL/min (ref >60)
Glucose, Bld: 105 mg/dL — ABNORMAL HIGH (ref 70–99)
Potassium: 3.8 mmol/L (ref 3.5–5.1)
Sodium: 134 mmol/L — ABNORMAL LOW (ref 135–145)

## 2022-05-06 LAB — MAGNESIUM: Magnesium: 1.3 mg/dL — ABNORMAL LOW (ref 1.7–2.4)

## 2022-05-06 LAB — GLUCOSE, CAPILLARY
Glucose-Capillary: 105 mg/dL — ABNORMAL HIGH (ref 70–99)
Glucose-Capillary: 180 mg/dL — ABNORMAL HIGH (ref 70–99)
Glucose-Capillary: 107 mg/dL — ABNORMAL HIGH (ref 70–99)
Glucose-Capillary: 129 mg/dL — ABNORMAL HIGH (ref 70–99)

## 2022-05-06 MED ORDER — PANTOPRAZOLE SODIUM 40 MG PO TBEC
40.0000 mg | DELAYED_RELEASE_TABLET | Freq: Two times a day (BID) | ORAL | Status: DC
  Administered 2022-05-06 – 2022-05-07 (×2): 40 mg via ORAL
  Filled 2022-05-06 (×2): qty 1

## 2022-05-06 MED ORDER — MAGNESIUM SULFATE 2 GM/50ML IV SOLN
2.0000 g | Freq: Once | INTRAVENOUS | Status: AC
  Administered 2022-05-06: 2 g via INTRAVENOUS
  Filled 2022-05-06: qty 50

## 2022-05-06 MED ORDER — DOCUSATE SODIUM 100 MG PO CAPS
100.0000 mg | ORAL_CAPSULE | Freq: Two times a day (BID) | ORAL | Status: DC
  Administered 2022-05-06 – 2022-05-07 (×3): 100 mg via ORAL
  Filled 2022-05-06 (×3): qty 1

## 2022-05-06 MED ORDER — SODIUM CHLORIDE 0.9 % IV SOLN
12.5000 mg | Freq: Once | INTRAVENOUS | Status: AC
  Administered 2022-05-06: 12.5 mg via INTRAVENOUS
  Filled 2022-05-06: qty 0.5

## 2022-05-06 NOTE — Progress Notes (Signed)
Mobility Specialist: Progress Note   05/06/22 1732  Mobility  Activity Dangled on edge of bed  Level of Assistance Moderate assist, patient does 50-74%  Assistive Device None  Activity Response Tolerated fair  Mobility Referral Yes  $Mobility charge 1 Mobility   Pt received in the bed and agreeable to mobility. ModA with bed mobility from supine to sitting EOB. C/o mild dizziness and generalized pain. Pt able to sit EOB with minA for balance for 5 minutes. Up to [facility] for balance secondary to fatigue. Pt declining standing attempts this session and requesting to try tomorrow morning. Pt back in bed with call bell at her side. Bed alarm is on. Will f/u tomorrow morning.   [place] Ted Goodner Mobility Specialist Please contact via SecureChat or Rehab office at [phone]

## 2022-05-06 NOTE — Plan of Care (Signed)
  Problem: Fluid Volume: Goal: Hemodynamic stability will improve Outcome: Progressing   Problem: Clinical Measurements: Goal: Diagnostic test results will improve Outcome: Progressing Goal: Signs and symptoms of infection will decrease Outcome: Progressing   Problem: Education: Goal: Knowledge of disease or condition will improve Outcome: Progressing Goal: Knowledge of secondary prevention will improve (MUST DOCUMENT ALL) Outcome: Progressing Goal: Knowledge of patient specific risk factors will improve [place] N/A or DELETE if not current risk factor) Outcome: Progressing   Problem: Ischemic Stroke/TIA Tissue Perfusion: Goal: Complications of ischemic stroke/TIA will be minimized Outcome: Progressing   Problem: Coping: Goal: Will verbalize positive feelings about self Outcome: Progressing Goal: Will identify appropriate support needs Outcome: Progressing   Problem: Health Behavior/Discharge Planning: Goal: Ability to manage health-related needs will improve Outcome: Progressing   Problem: Self-Care: Goal: Ability to participate in self-care as condition permits will improve Outcome: Progressing Goal: Verbalization of feelings and concerns over difficulty with self-care will improve Outcome: Progressing   Problem: Nutrition: Goal: Risk of aspiration will decrease Outcome: Progressing Goal: Dietary intake will improve Outcome: Progressing   Problem: Health Behavior/Discharge Planning: Goal: Ability to manage health-related needs will improve Outcome: Progressing   Problem: Clinical Measurements: Goal: Ability to maintain clinical measurements within normal limits will improve Outcome: Progressing Goal: Will remain free from infection Outcome: Progressing Goal: Diagnostic test results will improve Outcome: Progressing Goal: Cardiovascular complication will be avoided Outcome: Progressing   Problem: Activity: Goal: Risk for activity intolerance will  decrease Outcome: Progressing   Problem: Nutrition: Goal: Adequate nutrition will be maintained Outcome: Progressing   Problem: Pain Managment: Goal: General experience of comfort will improve Outcome: Progressing   Problem: Safety: Goal: Ability to remain free from injury will improve Outcome: Progressing   Problem: Skin Integrity: Goal: Risk for impaired skin integrity will decrease Outcome: Progressing

## 2022-05-06 NOTE — Progress Notes (Signed)
[facility] Progress Note Patient: Sherry [name] [id number] DOB: 10/17/1930 DOA: 04/25/2022  DOS: the patient was seen and examined on 05/06/2022  Brief hospital course: Sherry [name] is a 89 y.o. female with a history of hypertension, recurrent falls, breast cancer status postlumpectomy and radiation.  Patient presented secondary to headache, nausea, vomiting with associated dizziness.  MRI imaging confirmed presence of acute cerebellar infarct and CT angiogram of the head and neck was significant for left vertebral artery occlusion.  Patient was admitted for hypertensive emergency and acute stroke, initially requiring ICU admission and Cleviprex drip.  For antihypertensives restarted and Cleviprex weaned off.  Patient with intractable vertigo/nausea secondary to acute infarct.  Physical therapy recommending acute inpatient rehab on discharge, however no plan for discharge to SNF. 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 MRI confirms acute infarct of medial aspect of left cerebellar hemisphere in addition to likely acute/subacute infarct of the medial aspect of the right occipital lobe. CTA head and neck significant for an occluded left vertebral artery in the V2 segment. LDL of 122. Hemoglobin A1C of 5.6%. Transthoracic Echocardiogram significant for no atrial level shunt. Neurology recommendations for aspirin indefinitely and Plavix 75 mg for three months. Plavix is on hold due to GI bleed.  Discussed with neurology currently recommend to discontinue it. PT/OT recommendations for acute inpatient rehabilitation, now with plan to discharge to SNF.   Hypertensive emergency Per history, patient's outpatient antihypertensives discontinued in December. Patient managed initially on Cleviprex and able to wean off. Hydralazine held secondary to GI bleeding. Low-normal/normotensive Continue amlodipine 10 mg daily and  metoprolol   Acute GI bleeding Patient with acute blood loss anemia with FOBT positive stool. Associated elevated BUN. GI consulted. CT abdomen/pelvis obtained and suggests possible ulceration of the pylorus/descending portion of duodenum. Upper endoscopy performed on 3/4 and  was significant for esophageal/gastric and duodenal ulcers; one duodenal ulcer with stigmata of recent bleeding which was treated with Purastat. Continue aspirin.  Tolerating oral diet. Was on Protonix drip, switch to p.o. twice daily.   Acute blood loss anemia Secondary to GI bleeding. Hemoglobin of 14.6 on admission. Hemoglobin down to 6.7 today. Patient consented for blood transfusion. Hemoglobin up to 10 and stable. Daily CBC   Abdominal pain Unclear etiology but concern this is likely related to GI bleeding and anemia. Stat CT abdomen/pelvis obtained and confirmed concern for gastric/duodenal ulcer without evidence of perforation. GI consulted for GI bleeding. X-ray abdomen unremarkable.   Leukocytosis No source for infection noted; urinalysis not suggestive of infection. Chest x-ray without cardiopulmonary disease. Initial leukocytosis and downtrend, now with elevation with peak of 20,400 predominantly secondary to neutrophilia.   Hypokalemia Currently being replaced.   Acute urinary retention Unclear etiology. Possibly related to patient essentially being bed-bound. Foley catheter placed on 3/2 secondary to severe urinary retention. Will attempt voiding trial tomorrow.   Lactic acidosis Present on admission. Improved with IV fluids.   Intractable nausea/vomiting Intractable vertigo Secondary to acute stroke. Therapies have not been successful in treating symptoms -Continue Reglan scheduled and meclizine scheduled -Zofran PRN, Valium as needed.   Right breast nodule/mass 19 mm mixed density nodule/mass noted on CT chest. Patient with a history of right breast cancer s/p lumpectomy. Patient will need  follow-up for nodule with mammography.   Subjective: Continues to report vertigo continues to report hiccups.  No nausea but does have some abdominal pain.  No BM so far.  Passing gas.  Minimal oral intake.  Per OT patient is not able to participate well given still significant symptom burden.  Physical Exam: General: in Mild distress, No Rash Cardiovascular: S1 and S2 Present, No Murmur Respiratory: Good respiratory effort, Bilateral Air entry present. No Crackles, No wheezes Abdomen: Bowel Sound present, No tenderness Extremities: No edema Neuro: Alert and oriented x3, no new focal deficit, left-sided ataxia  Data Reviewed: I have Reviewed nursing notes, Vitals, and Lab results. Since last encounter, pertinent lab results CBC and BMP   . I have ordered test including CBC and BMP  .   Disposition: Status is: Inpatient Remains inpatient appropriate because: Need for IV therapy  Place and maintain sequential compression device Start: 05/02/22 1103 SCDs Start: 04/25/22 1809   Family Communication: No one at bedside Level of care: Telemetry Medical continue due to stroke Vitals:   05/06/22 0357 05/06/22 0717 05/06/22 1201 05/06/22 1608  BP: (!) 137/50 (!) 134/50 132/61 (!) 145/52  Pulse: 73 74 70 81  Resp: '17 16 18 16  '$ Temp: 98.2 F (36.8 C) 98.4 F (36.9 C) 98.9 F (37.2 C) 99.6 F (37.6 C)  TempSrc: Axillary Oral Oral Oral  SpO2: 98% 97% 100% 96%  Weight:      Height:         Author: Berle Mull, MD 05/06/2022 5:52 PM  Please look on [email or web address] to find out who is on call.

## 2022-05-06 NOTE — Progress Notes (Signed)
Speech Language Pathology Treatment: Cognitive-Linquistic  Patient Details Name: Sherry [name] MRN: [id number] DOB: 1930-09-07 Today's Date: 05/06/2022 Time: [id number] SLP Time Calculation (min) (ACUTE ONLY): 10 min  Assessment / Plan / Recommendation Clinical Impression  Sherry [name] completed a pill sorting task in which she was asked to identify the errors. Initially, Sherry [name] required Min verbal cues to identify errors, but appeared to have increased difficulty as the activity progressed, requiring Mod verbal and visual cues to attend to the task. Sherry [name] stated that she felt the task was "silly" and that she "would figure it out at home". Suspect tasks requiring sustained attention contribute to the perceived difficulty and impact her level of effort. SLP will continue to f/u as able during acute stay to address attention, awareness, and complex problem-solving through functional tasks.    HPI HPI: Sherry [name] is an 89 yo female presenting to ED with reports of nausea, vomiting, and headache. MRI Brain showed an acute infarct in the medial aspect of the L cerebellar hemisphere. PMH includes HTN, SDH, multiple falls, and squamous cell carcinoma.      SLP Plan  Continue with current plan of care      Recommendations for follow up therapy are one component of a multi-disciplinary discharge planning process, led by the attending physician.  Recommendations may be updated based on patient status, additional functional criteria and insurance authorization.    Recommendations                   Follow Up Recommendations: Skilled nursing-short term rehab (<3 hours/day) Assistance recommended at discharge: Frequent or constant Supervision/Assistance SLP Visit Diagnosis: Cognitive communication deficit [id number]) Plan: Continue with current plan of care        Fabio Asa., Student SLP,  05/06/2022, 12:39 PM

## 2022-05-06 NOTE — Progress Notes (Signed)
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 PMH includes: uncontrolled HTN, and recent fall with concussion.   OT comments  Patient supine in bed with spouse at side.  Patient agreeable to attempt mobilization, voicing "We can try".  She reports dizziness is better than yesterday, but remains present. Reviewed gaze stabilization technique, utilized during mobility with pt requiring mod-max assist to maintain attention to this task; difficulty with multitasking during bed mobility.  BP stable from supine to sitting, pt reports dizziness worsens and symptomatically becomes more fatigued.  Pt tolerating a few minutes at EOB before assisted back to supine.  Worked further on gaze stabilization during rolling, due to incontinence of bowel (pt reports no awareness to incontinence); as well as with looking at "A" with head turns at 12 inches from her face.  Min-max cueing throughout session to keep eyes opens and engage in exercises fully, as tolerated.  Encouraged (and moved) spouse to pts L side and raised pt up to 40* HOB elevation, as well as educated pt and spouse on completion of 2 exercises today (gaze stabilization with "A" and self AAROM of L shoulder elbow flexion/extension). Patient with poor awareness to deficits, situation; demonstrates difficulty providing feedback for progression and fatigues easily.  Asked RN to assist with keeping HOB elevated, and progressing HOB higher throughout the day.Will re-assess OT goals next session. Will follow acutely.   Recommendations for follow up therapy are one component of a multi-disciplinary discharge planning  process, led by the attending physician.  Recommendations may be updated based on patient status, additional functional criteria and insurance authorization.    Follow Up Recommendations  Skilled nursing-short term rehab (<3 hours/day)     Assistance Recommended at Discharge Frequent or constant Supervision/Assistance  Patient can return home with the following  Two people to help with walking and/or transfers;A lot of help with bathing/dressing/bathroom;Assistance with cooking/housework;Direct supervision/assist for medications management;Direct supervision/assist for financial management;Assist for transportation;Help with stairs or ramp for entrance   Equipment Recommendations  Other (comment) (defer)    Recommendations for Other Services Other (comment) (palliative care)    Precautions / Restrictions Precautions Precautions: Fall Precaution Comments: dizziness Restrictions Weight Bearing Restrictions: No       Mobility Bed Mobility Overal bed mobility: Needs Assistance Bed Mobility: Rolling, Sidelying to Sit, Sit to Sidelying Rolling: Mod assist Sidelying to sit: Max assist, +2 for physical assistance, +2 for safety/equipment     Sit to sidelying: Max assist, +2 for physical assistance, +2 for safety/equipment General bed mobility comments: cueing for gaze stabalization, with mod-max cueing due to eye shifts, but requires assist elevate trunk and assist with LEs off EOB fully    Transfers                   General transfer comment: deferred d/t unable to tolerate     Balance Overall balance assessment: Needs assistance Sitting-balance support: Feet supported Sitting balance-Leahy Scale: Poor Sitting balance - Comments: Left lateral lean, up to mod assist Postural control: Left lateral lean  ADL either performed or assessed with clinical judgement   ADL Overall ADL's : Needs assistance/impaired                  Upper Body Dressing : Maximal assistance;Bed level   Lower Body Dressing: Total assistance;Sitting/lateral leans;Bed level     Toilet Transfer Details (indicate cue type and reason): deferred Toileting- Clothing Manipulation and Hygiene: Total assistance;+2 for physical assistance;+2 for safety/equipment;Bed level Toileting - Clothing Manipulation Details (indicate cue type and reason): incontinent of bowel     Functional mobility during ADLs: Maximal assistance;+2 for physical assistance;+2 for safety/equipment General ADL Comments: patient with dizziness, nausea limiting participation and progression of ADLs    Extremity/Trunk Assessment              Vision       Perception     Praxis      Cognition Arousal/Alertness: Awake/alert Behavior During Therapy: Flat affect Overall Cognitive Status: Impaired/Different from baseline Area of Impairment: Problem solving, Awareness, Attention, Following commands, Memory                   Current Attention Level: Sustained Memory: Decreased short-term memory Following Commands: Follows one step commands consistently, Follows one step commands with increased time, Follows multi-step commands inconsistently   Awareness: Emergent Problem Solving: Slow processing, Decreased initiation, Difficulty sequencing, Requires verbal cues, Requires tactile cues General Comments: Pt continues to require encouragement.  She demonstrates no recall of exercises provided from prior session.  Continues to present with decreased awareness situation and requires max cueing for multistep tasks, including gaze stabilization.        Exercises Exercises: Other exercises Other Exercises Other Exercises: gaze fixation exercises with pt in bed, pt holding "A" with R hand and working on head turns to L and R with gaze stabilized; min cueing required for gaze fixation with head turn to L - pt voicing "I don't know" when trying to gain insight on  how this is making her feel Other Exercises: AAROM with L UE for elbow/shoulder flexion to touch lips x 10. Educated pt and spouse on self AAROM for R UE to assist L UE.    Shoulder Instructions       General Comments discussed progression of keeping HOB elevated during the day, pt hesitant but agreeable when therapist voiced "I know you don't want to stay in the bed forever" and she agreed to work on it.    Pertinent Vitals/ Pain       Pain Assessment Pain Assessment: Faces Faces Pain Scale: No hurt Pain Intervention(s): Monitored during session  Home Living                                          Prior Functioning/Environment              Frequency  Min 2X/week        Progress Toward Goals  OT Goals(current goals can now be found in the care plan section)  Progress towards OT goals: OT to reassess next treatment  Acute Rehab OT Goals Patient Stated Goal: feel better OT Goal Formulation: With patient Time For Goal Achievement: 05/10/22 Potential to Achieve Goals: [place] Discharge plan remains appropriate;Frequency remains appropriate    Co-evaluation                 AM-PAC OT "6 Clicks" Daily  Activity     Outcome Measure   Help from another person eating meals?: A Little Help from another person taking care of personal grooming?: A Little Help from another person toileting, which includes using toliet, bedpan, or urinal?: Total Help from another person bathing (including washing, rinsing, drying)?: A Lot Help from another person to put on and taking off regular upper body clothing?: A Lot Help from another person to put on and taking off regular lower body clothing?: Total 6 Click Score: 12    End of Session    OT Visit Diagnosis: Other abnormalities of gait and mobility (R26.89);Muscle weakness (generalized) (M62.81);Dizziness and giddiness (R42);Other symptoms and signs involving the nervous system (R29.898)   Activity  Tolerance Other (comment) (treatment limited by dizziness)   Patient Left in bed;with call bell/phone within reach;with bed alarm set;with family/visitor present   Nurse Communication Mobility status;Other (comment) (HOB elevation)        Time: [id number] OT Time Calculation (min): 37 min  Charges: OT General Charges $OT Visit: 1 Visit OT Treatments $Neuromuscular Re-education: 23-37 mins  [place] Office [phone]   Delight Stare 05/06/2022, 12:40 PM

## 2022-05-07 LAB — BASIC METABOLIC PANEL
Anion gap: 11 (ref 5–15)
BUN: 13 mg/dL (ref 8–23)
CO2: 23 mmol/L (ref 22–32)
Calcium: 8.5 mg/dL — ABNORMAL LOW (ref 8.9–10.3)
Chloride: 100 mmol/L (ref 98–111)
Creatinine, Ser: 0.72 mg/dL (ref 0.44–1.00)
GFR, Estimated: >60 mL/min (ref >60)
Glucose, Bld: 142 mg/dL — ABNORMAL HIGH (ref 70–99)
Potassium: 3.5 mmol/L (ref 3.5–5.1)
Sodium: 134 mmol/L — ABNORMAL LOW (ref 135–145)

## 2022-05-07 LAB — GLUCOSE, CAPILLARY
Glucose-Capillary: 99 mg/dL (ref 70–99)
Glucose-Capillary: 102 mg/dL — ABNORMAL HIGH (ref 70–99)
Glucose-Capillary: 133 mg/dL — ABNORMAL HIGH (ref 70–99)
Glucose-Capillary: 172 mg/dL — ABNORMAL HIGH (ref 70–99)

## 2022-05-07 LAB — CBC
HCT: 30.8 % — ABNORMAL LOW (ref 36.0–46.0)
Hemoglobin: 10.3 g/dL — ABNORMAL LOW (ref 12.0–15.0)
MCH: 30.8 pg (ref 26.0–34.0)
MCHC: 33.4 g/dL (ref 30.0–36.0)
MCV: 92.2 fL (ref 80.0–100.0)
Platelets: 390 10*3/uL (ref 150–400)
RBC: 3.34 MIL/uL — ABNORMAL LOW (ref 3.87–5.11)
RDW: 13.3 % (ref 11.5–15.5)
WBC: 10.2 10*3/uL (ref 4.0–10.5)
nRBC: 0 % (ref 0.0–0.2)

## 2022-05-07 LAB — MAGNESIUM: Magnesium: 1.9 mg/dL (ref 1.7–2.4)

## 2022-05-07 MED ORDER — PANTOPRAZOLE SODIUM 40 MG PO TBEC
40.0000 mg | DELAYED_RELEASE_TABLET | Freq: Two times a day (BID) | ORAL | Status: DC
  Administered 2022-05-07 – 2022-05-12 (×10): 40 mg via ORAL
  Filled 2022-05-07 (×10): qty 1

## 2022-05-07 MED ORDER — SENNOSIDES-DOCUSATE SODIUM 8.6-50 MG PO TABS
1.0000 | ORAL_TABLET | Freq: Two times a day (BID) | ORAL | Status: DC
  Administered 2022-05-07 – 2022-05-12 (×11): 1 via ORAL
  Filled 2022-05-07 (×11): qty 1

## 2022-05-07 MED ORDER — ENSURE ENLIVE PO LIQD: 237.0000 mL | Freq: Three times a day (TID) | ORAL | Status: DC

## 2022-05-07 MED ORDER — DIAZEPAM 2 MG PO TABS
2.0000 mg | ORAL_TABLET | Freq: Three times a day (TID) | ORAL | Status: DC
  Filled 2022-05-07: qty 1

## 2022-05-07 MED ORDER — CHLORPROMAZINE HCL 25 MG PO TABS
25.0000 mg | ORAL_TABLET | Freq: Three times a day (TID) | ORAL | Status: DC
  Administered 2022-05-07 – 2022-05-08 (×4): 25 mg via ORAL
  Filled 2022-05-07 (×4): qty 1

## 2022-05-07 NOTE — Progress Notes (Signed)
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 PMH includes: uncontrolled HTN, and recent fall with concussion.    PT Comments    Pt required max assist bed mobility, mod assist sit to stand, max assist to maintain standing/sitting balance in stedy,  and dependent stedy transfer bed to recliner. Pt continues to be limited by dizziness and nausea, but good progress today ability to tolerate transfer OOB. PT to continue per POC.   Recommendations for follow up therapy are one component of a multi-disciplinary discharge planning process, led by the attending physician.  Recommendations may be updated based on patient status, additional functional criteria and insurance authorization.  Follow Up Recommendations  Skilled nursing-short term rehab (<3 hours/day) Can patient physically be transported by private vehicle: No   Assistance Recommended at Discharge Frequent or constant Supervision/Assistance  Patient can return home with the following Two people to help with walking and/or transfers;Two people to help with bathing/dressing/bathroom;Assistance with cooking/housework;Direct supervision/assist for medications management;Assistance with feeding;Direct supervision/assist for financial management;Assist for transportation;Help with stairs or ramp for entrance   Equipment Recommendations  Other (comment) (defer to post acute)    Recommendations for Other Services       Precautions / Restrictions Precautions Precautions: Fall;Other (comment) Precaution Comments: dizziness     Mobility  Bed Mobility Overal bed mobility: Needs Assistance Bed  Mobility: Supine to Sit   Sidelying to sit: Max assist, HOB elevated       General bed mobility comments: assist with BLE and trunk, use of bed pad to scoot to EOB    Transfers Overall transfer level: Needs assistance Equipment used: Ambulation equipment used Transfers: Sit to/from Stand, Bed to chair/wheelchair/BSC Sit to Stand: Max assist           General transfer comment: mod assist to power up to stand in stedy. Max assist to maintain balance due to heavy L lean. Dependent stedy transfer bed to recliner. Transfer via Lift Equipment: Stedy  Ambulation/Gait               General Gait Details: unable   Marine scientist Rankin (Stroke Patients Only) Modified Rankin (Stroke Patients Only) Pre-Morbid Rankin Score: No symptoms Modified Rankin: Severe disability     Balance Overall balance assessment: Needs assistance Sitting-balance support: Feet supported, Single extremity supported Sitting balance-Leahy Scale: Poor   Postural control: Left lateral lean Standing balance support: Bilateral upper extremity supported, During functional activity Standing balance-Leahy Scale: Poor                              Cognition Arousal/Alertness: Awake/alert Behavior During Therapy: Flat affect Overall Cognitive Status: Impaired/Different from baseline Area of Impairment: Problem solving, Awareness, Attention, Following commands, Memory                   Current Attention Level: Sustained Memory: Decreased short-term memory Following Commands: Follows one step commands consistently, Follows one step commands with increased time   Awareness: Emergent Problem Solving: Slow processing, Decreased initiation, Difficulty sequencing, Requires verbal cues, Requires  tactile cues General Comments: Minimal engagement with therapist, with pt stating "I don't know" to most questions. Appears despondent.         Exercises      General Comments        Pertinent Vitals/Pain Pain Assessment Pain Assessment: Faces Faces Pain Scale: Hurts a little bit Pain Location: discomfort with nausea and dizziness Pain Descriptors / Indicators: Discomfort, Grimacing Pain Intervention(s): Monitored during session, Repositioned    Home Living                          Prior Function            PT Goals (current goals can now be found in the care plan section) Acute Rehab PT Goals Patient Stated Goal: to reduce her dizziness and nausea Progress towards PT goals: Progressing toward goals    Frequency    Min 3X/week      PT Plan Current plan remains appropriate    Co-evaluation              AM-PAC PT "6 Clicks" Mobility   Outcome Measure  Help needed turning from your back to your side while in a flat bed without using bedrails?: A Lot Help needed moving from lying on your back to sitting on the side of a flat bed without using bedrails?: A Lot Help needed moving to and from a bed to a chair (including a wheelchair)?: Total Help needed standing up from a chair using your arms (e.g., wheelchair or bedside chair)?: A Lot Help needed to walk in hospital room?: Total Help needed climbing 3-5 steps with a railing? : Total 6 Click Score: 9    End of Session Equipment Utilized During Treatment: Gait belt Activity Tolerance: Patient tolerated treatment well Patient left: in chair;with call bell/phone within reach;with chair alarm set Nurse Communication: Mobility status PT Visit Diagnosis: Other abnormalities of gait and mobility (R26.89);Muscle weakness (generalized) (M62.81);History of falling (Z91.81);Difficulty in walking, not elsewhere classified (R26.2);Unsteadiness on feet (R26.81);Dizziness and giddiness (R42)     Time: [id number] PT Time Calculation (min) (ACUTE ONLY): 27 min  Charges:  $Therapeutic Activity: 23-37 mins                     Gloriann Loan., PT  Office #  [phone]    Lorriane Shire 05/07/2022, 12:23 PM

## 2022-05-07 NOTE — Progress Notes (Signed)
[facility] Progress Note Patient: Sherry [name] [id number] DOB: 11-27-1930 DOA: 04/25/2022  DOS: the patient was seen and examined on 05/07/2022  Brief hospital course: Sherry [name] is a 89 y.o. female with a history of hypertension, recurrent falls, breast cancer status postlumpectomy and radiation.  Patient presented secondary to headache, nausea, vomiting with associated dizziness.  MRI imaging confirmed presence of acute cerebellar infarct and CT angiogram of the head and neck was significant for left vertebral artery occlusion.  Patient was admitted for hypertensive emergency and acute stroke, initially requiring ICU admission and Cleviprex drip.  For antihypertensives restarted and Cleviprex weaned off.  Patient with intractable vertigo/nausea secondary to acute infarct.  Physical therapy recommending acute inpatient rehab on discharge, however no plan for discharge to SNF. 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 MRI confirms acute infarct of medial aspect of left cerebellar hemisphere in addition to likely acute/subacute infarct of the medial aspect of the right occipital lobe. CTA head and neck significant for an occluded left vertebral artery in the V2 segment. LDL of 122. Hemoglobin A1C of 5.6%. Transthoracic Echocardiogram significant for no atrial level shunt. Neurology recommendations for aspirin indefinitely and Plavix 75 mg for three months. Plavix is on hold due to GI bleed.  Discussed with neurology currently recommend to discontinue it. PT/OT initially recommended acute inpatient rehabilitation, now with plan to discharge to SNF.   Hypertensive emergency Per history, patient's outpatient antihypertensives discontinued in December. Patient managed initially on Cleviprex and able to wean off. Hydralazine held secondary to GI bleeding.  Continue amlodipine 10 mg daily and metoprolol   Acute GI  bleeding Patient with acute blood loss anemia with FOBT positive stool. Associated elevated BUN. GI consulted. CT abdomen/pelvis obtained and suggests possible ulceration of the pylorus/descending portion of duodenum. Upper endoscopy performed on 3/4 and  was significant for esophageal/gastric and duodenal ulcers; one duodenal ulcer with stigmata of recent bleeding which was treated with Purastat. Continue aspirin.  Tolerating oral diet. Was on Protonix drip, switch to p.o. twice daily.   Acute blood loss anemia Secondary to GI bleeding. Hemoglobin of 14.6 on admission. Hemoglobin down to 6.7 today. Patient consented for blood transfusion. Hemoglobin up to 10 and stable.   Abdominal pain Unclear etiology but concern this is likely related to GI bleeding and anemia. Stat CT abdomen/pelvis obtained and confirmed concern for gastric/duodenal ulcer without evidence of perforation. GI consulted for GI bleeding. X-ray abdomen unremarkable.   Leukocytosis No source for infection noted; urinalysis not suggestive of infection. Chest x-ray without cardiopulmonary disease. Initial leukocytosis and downtrend, now with elevation with peak of 20,400 predominantly secondary to neutrophilia.   Hypokalemia Currently being replaced.   Acute urinary retention Unclear etiology. Possibly related to patient essentially being bed-bound. Foley catheter placed on 3/2 secondary to severe urinary retention. Will attempt voiding trial tomorrow.   Lactic acidosis Present on admission. Improved with IV fluids.   Intractable nausea/vomiting Intractable vertigo Secondary to acute stroke. Therapies have not been successful in treating symptoms Patient was on scheduled Reglan without any benefit.  Patient was also on scheduled magnesium without any benefit. Will switch to p.o. on due to her complaints of hiccups which should help with nausea as well as vertigo.  Monitor response.   Right breast nodule/mass 19 mm mixed  density nodule/mass noted on CT chest. Patient with a history of right breast cancer s/p lumpectomy. Patient will need follow-up for nodule with mammography.  Goals of care conversation. 3/8 discussed with the patient with regards to her goals of care.  She wants to continue to get better continue to work with physical therapy.  And motivated.   Subjective: Continues to report nausea.  Continues to report hiccups.  Vertigo is limiting her ability to work with physical therapy.  No fever no chills.  Physical Exam: General: in Mild distress, No Rash Cardiovascular: S1 and S2 Present, No Murmur Respiratory: Good respiratory effort, Bilateral Air entry present. No Crackles, No wheezes Abdomen: Bowel Sound present, No tenderness Extremities: No edema Neuro: Alert and oriented x3, no new focal deficit, left-sided ataxia  Data Reviewed: I have Reviewed nursing notes, Vitals, and Lab results. Monitor CBC and BMP.  Continue to monitor CBC and BMP.  Disposition: Status is: Inpatient Remains inpatient appropriate because: Need for improvement in symptoms so that the patient can work with therapy.  Place and maintain sequential compression device Start: 05/02/22 1103 SCDs Start: 04/25/22 1809   Family Communication: No one at bedside Level of care: Telemetry Medical continue due to stroke Vitals:   05/07/22 0712 05/07/22 0717 05/07/22 1123 05/07/22 1626  BP:  (!) 138/55 (!) 134/49 (!) 132/51  Pulse:  73 72 81  Resp:  '18 18 18  '$ Temp:  98.9 F (37.2 C) 99.5 F (37.5 C) 98.6 F (37 C)  TempSrc:  Oral Oral Oral  SpO2:  97% 96% 94%  Weight: 57.7 kg     Height:         Author: Berle Mull, MD 05/07/2022 7:47 PM  Please look on [email or web address] to find out who is on call.

## 2022-05-07 NOTE — Progress Notes (Signed)
Mobility Specialist: Progress Note   05/07/22 1136  Mobility  Activity Transferred from chair to bed  Level of Assistance +2 (takes two people)  Assistive Device Other (Comment) (HHA)  Distance Ambulated (ft) 2 ft  Activity Response Tolerated well  Mobility Referral Yes  $Mobility charge 1 Mobility   Pt received in the chair and assisted back to bed. MinA to stand and pivot to the bed. No c/o throughout. Pt back to bed with call bell in her lap. Bed alarm is on.   [place] Mobility Specialist Please contact via SecureChat or Rehab office at [phone]

## 2022-05-07 NOTE — TOC Progression Note (Signed)
Transition of Care [facility]) - Progression Note    Patient Details  Name: Sherry [name] MRN: [id number] Date of Birth: [birth year]  [facility]) CM/SW [place], [place] Phone Number: 05/07/2022, 1:04 PM  Clinical Narrative:   CSW following for SNF placement. CSW discussed patient with MD, therapy team, and [facility], and plan is to adjust some medications to see if patient can reduce dizziness/nausea and increase participation in therapy. [place] in agreement to see how patient does over the weekend and review again on Monday. CSW to follow.    Expected Discharge Plan: [place] Barriers to Discharge: Continued Medical Work up  Expected Discharge Plan and [place] Choice: [place] Living arrangements for the past 2 months: Single Family Home                                       Social Determinants of Health (SDOH) Interventions SDOH Screenings   Food Insecurity: No Food Insecurity (05/05/2022)  Housing: [place]  (05/05/2022)  Transportation Needs: No Transportation Needs (05/05/2022)  Utilities: Not At Risk (05/05/2022)  Tobacco Use: Low Risk  (05/06/2022)    Readmission Risk Interventions    01/07/2022    2:50 PM  Readmission Risk Prevention Plan  Post Dischage Appt Complete  Medication Screening Complete  Transportation Screening Complete

## 2022-05-07 NOTE — Plan of Care (Signed)
  Problem: Fluid Volume: Goal: Hemodynamic stability will improve Outcome: Progressing   Problem: Clinical Measurements: Goal: Signs and symptoms of infection will decrease Outcome: Progressing   Problem: Education: Goal: Knowledge of disease or condition will improve Outcome: Progressing   Problem: Ischemic Stroke/TIA Tissue Perfusion: Goal: Complications of ischemic stroke/TIA will be minimized Outcome: Progressing   Problem: Coping: Goal: Will identify appropriate support needs Outcome: Progressing   Problem: Nutrition: Goal: Risk of aspiration will decrease Outcome: Progressing   Problem: Clinical Measurements: Goal: Ability to maintain clinical measurements within normal limits will improve Outcome: Progressing

## 2022-05-08 LAB — CBC
HCT: 28.5 % — ABNORMAL LOW (ref 36.0–46.0)
Hemoglobin: 9.5 g/dL — ABNORMAL LOW (ref 12.0–15.0)
MCH: 30.5 pg (ref 26.0–34.0)
MCHC: 33.3 g/dL (ref 30.0–36.0)
MCV: 91.6 fL (ref 80.0–100.0)
Platelets: 371 10*3/uL (ref 150–400)
RBC: 3.11 MIL/uL — ABNORMAL LOW (ref 3.87–5.11)
RDW: 13.2 % (ref 11.5–15.5)
WBC: 9.6 10*3/uL (ref 4.0–10.5)
nRBC: 0 % (ref 0.0–0.2)

## 2022-05-08 LAB — BASIC METABOLIC PANEL
Anion gap: 9 (ref 5–15)
BUN: 10 mg/dL (ref 8–23)
CO2: 23 mmol/L (ref 22–32)
Calcium: 8.5 mg/dL — ABNORMAL LOW (ref 8.9–10.3)
Chloride: 103 mmol/L (ref 98–111)
Creatinine, Ser: 0.54 mg/dL (ref 0.44–1.00)
GFR, Estimated: >60 mL/min (ref >60)
Glucose, Bld: 100 mg/dL — ABNORMAL HIGH (ref 70–99)
Potassium: 3.8 mmol/L (ref 3.5–5.1)
Sodium: 135 mmol/L (ref 135–145)

## 2022-05-08 LAB — GLUCOSE, CAPILLARY
Glucose-Capillary: 110 mg/dL — ABNORMAL HIGH (ref 70–99)
Glucose-Capillary: 202 mg/dL — ABNORMAL HIGH (ref 70–99)
Glucose-Capillary: 138 mg/dL — ABNORMAL HIGH (ref 70–99)
Glucose-Capillary: 159 mg/dL — ABNORMAL HIGH (ref 70–99)

## 2022-05-08 LAB — MAGNESIUM: Magnesium: 1.8 mg/dL (ref 1.7–2.4)

## 2022-05-08 MED ORDER — BOOST / RESOURCE BREEZE PO LIQD CUSTOM
1.0000 | Freq: Three times a day (TID) | ORAL | Status: DC
  Administered 2022-05-08 – 2022-05-12 (×12): 1 via ORAL
  Filled 2022-05-08: qty 1

## 2022-05-08 MED ORDER — CHLORPROMAZINE HCL 25 MG PO TABS
25.0000 mg | ORAL_TABLET | Freq: Four times a day (QID) | ORAL | Status: DC
  Administered 2022-05-08 – 2022-05-10 (×8): 25 mg via ORAL
  Filled 2022-05-08 (×11): qty 1

## 2022-05-08 NOTE — Progress Notes (Signed)
Mobility Specialist Progress Note   05/08/22 1520  Mobility  Activity Transferred from bed to chair  Level of Assistance +2 (takes two people) (Mod A)  Assistive Device Other (Comment) (HHA)  Range of Motion/Exercises Active;All extremities  Activity Response Tolerated well   Patient received in supine and agreeable to participate. Required min A for bed mobility, upon dangling EOB patient c/o dizziness. Required mod A +2 to stand/pivot to recliner chair. Tolerated without incident. Was left in recliner with all needs met, call bell in reach.   [place] Nhia Heaphy, BS EXP Mobility Specialist Please contact via SecureChat or Rehab office at [phone]

## 2022-05-08 NOTE — Progress Notes (Signed)
[facility] Progress Note Patient: MABELLE GLUCKSMAN [id number] DOB: 16-Jun-1930 DOA: 04/25/2022  DOS: the patient was seen and examined on 05/08/2022  Brief hospital course: MAKIAYA OFLAHERTY is a 89 y.o. female with a history of hypertension, recurrent falls, breast cancer status postlumpectomy and radiation.  Patient presented secondary to headache, nausea, vomiting with associated dizziness.  MRI imaging confirmed presence of acute cerebellar infarct and CT angiogram of the head and neck was significant for left vertebral artery occlusion.  Patient was admitted for hypertensive emergency and acute stroke, initially requiring ICU admission and Cleviprex drip.  For antihypertensives restarted and Cleviprex weaned off.  Patient with intractable vertigo/nausea secondary to acute infarct.  Physical therapy recommending acute inpatient rehab on discharge, however no plan for discharge to SNF. 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  MRI confirms acute infarct of medial aspect of left cerebellar hemisphere in addition to likely acute/subacute infarct of the medial aspect of the right occipital lobe.  CTA head and neck significant for an occluded left vertebral artery in the V2 segment.  LDL of 122.  Hemoglobin A1C of 5.6%.  2D echo shows preserved EF significant for no atrial level shunt.  Neurology was consulted. Initially recommended to take aspirin and Plavix for 3 months followed by aspirin. Patient developed GI bleed and therefore Plavix have been discontinued based on the discussion with neurology despite GI clearing the patient to take Plavix. PT/OT initially recommended acute inpatient rehabilitation, now with plan to discharge to SNF. Patient having difficulty working with physical therapy due to dizziness and nausea.   Hypertensive emergency Per history, patient's outpatient antihypertensives discontinued in  December. Patient managed initially on Cleviprex and able to wean off. Hydralazine held secondary to GI bleeding.  Continue amlodipine 10 mg daily and metoprolol   Acute GI bleeding Patient with acute blood loss anemia with FOBT positive stool. Associated elevated BUN. GI consulted. CT abdomen/pelvis obtained and suggests possible ulceration of the pylorus/descending portion of duodenum. Upper endoscopy performed on 3/4 and  was significant for esophageal/gastric and duodenal ulcers; one duodenal ulcer with stigmata of recent bleeding which was treated with Purastat. Continue aspirin.  Tolerating oral diet. Was on Protonix drip, switch to p.o. twice daily.   Acute blood loss anemia Secondary to GI bleeding. Hemoglobin of 14.6 on admission. Hemoglobin down to 6.7 today. Patient consented for blood transfusion. Hemoglobin up to 10 and stable.  Intractable nausea/vomiting Intractable vertigo Secondary to acute stroke. Therapies have not been successful in treating symptoms Patient was on scheduled Reglan without any benefit.  Patient was also on scheduled meclizine without any benefit. Currently on Thorazine 3 times daily.  Increase to 4 times daily. Check orthostatic vitals.  (Patient with poor p.o. intake).  Abdominal pain Unclear etiology but concern this is likely related to GI bleeding and anemia. Stat CT abdomen/pelvis obtained and confirmed concern for gastric/duodenal ulcer without evidence of perforation. GI consulted for GI bleeding. X-ray abdomen unremarkable.   Leukocytosis No source for infection noted; urinalysis not suggestive of infection. Chest x-ray without cardiopulmonary disease. Initial leukocytosis and downtrend, now with elevation with peak of 20,400 predominantly secondary to neutrophilia.   Hypokalemia Monitor and replace.   Acute urinary retention Unclear etiology. Possibly related to patient essentially being bed-bound. Foley catheter placed on 3/2 secondary to  severe urinary retention.  Catheter removed on 3/8.  Patient did have some retention requiring intermittent catheterization x 1.  If retaining more  than 300 mL will place Foley catheter again.   Lactic acidosis Present on admission. Improved with IV fluids.  Right breast nodule/mass 19 mm mixed density nodule/mass noted on CT chest. Patient with a history of right breast cancer s/p lumpectomy. Patient will need follow-up for nodule with mammography.  Goals of care conversation. 3/8 discussed with the patient with regards to her goals of care.  She wants to continue to get better continue to work with physical therapy.  And motivated.   Subjective: Continues to report all the symptoms of nausea, hiccups and vertigo with mild improvement.  Minimal oral intake.  Passing gas but no BM.  Last recorded BM was on 3/5.  Physical Exam: Clear to auscultation. S1-S2 present. Bowel sound present.  No tenderness. Alert and oriented x 3. Left-sided ataxia noted.  Data Reviewed: I have Reviewed nursing notes, Vitals, and Lab results. CBC and BMP reviewed.  Will monitor clinically for now.  Disposition: Status is: Inpatient Remains inpatient appropriate because: Need for improvement in symptoms so that the patient can work with therapy.  Place and maintain sequential compression device Start: 05/02/22 1103 SCDs Start: 04/25/22 1809   Family Communication: Family at bedside Level of care: Telemetry Medical continue due to stroke Vitals:   05/08/22 0702 05/08/22 0807 05/08/22 1200 05/08/22 1536  BP:  (!) 125/52 (!) 124/51 (!) 132/57  Pulse:  77 76 76  Resp:  '17 18 18  '$ Temp:  97.6 F (36.4 C) 98 F (36.7 C) 98.9 F (37.2 C)  TempSrc:  Oral Oral Oral  SpO2:  96% 95% 97%  Weight: 53.3 kg     Height:         Author: Berle Mull, MD 05/08/2022 5:22 PM  Please look on [email or web address] to find out who is on call.

## 2022-05-08 NOTE — Plan of Care (Signed)
  Problem: Education: Goal: Knowledge of disease or condition will improve Outcome: Progressing   Problem: Coping: Goal: Will identify appropriate support needs Outcome: Progressing   Problem: Nutrition: Goal: Risk of aspiration will decrease Outcome: Progressing Goal: Dietary intake will improve Outcome: Progressing   Problem: Self-Care: Goal: Ability to participate in self-care as condition permits will improve Outcome: Not Progressing   Problem: Health Behavior/Discharge Planning: Goal: Ability to manage health-related needs will improve Outcome: Not Progressing   Problem: Pain Managment: Goal: General experience of comfort will improve Outcome: Not Progressing

## 2022-05-09 LAB — GLUCOSE, CAPILLARY
Glucose-Capillary: 102 mg/dL — ABNORMAL HIGH (ref 70–99)
Glucose-Capillary: 140 mg/dL — ABNORMAL HIGH (ref 70–99)
Glucose-Capillary: 180 mg/dL — ABNORMAL HIGH (ref 70–99)
Glucose-Capillary: 190 mg/dL — ABNORMAL HIGH (ref 70–99)

## 2022-05-09 MED ORDER — ACETAMINOPHEN 500 MG PO TABS
1000.0000 mg | ORAL_TABLET | Freq: Two times a day (BID) | ORAL | Status: DC
  Administered 2022-05-09 – 2022-05-12 (×7): 1000 mg via ORAL
  Filled 2022-05-09 (×7): qty 2

## 2022-05-09 NOTE — Progress Notes (Signed)
[facility] Progress Note Patient: Sherry [name] [id number] DOB: 11-11-1930 DOA: 04/25/2022  DOS: the patient was seen and examined on 05/09/2022  Brief hospital course: Sherry [name] is a 89 y.o. female with a history of hypertension, recurrent falls, breast cancer status postlumpectomy and radiation.  Patient presented secondary to headache, nausea, vomiting with associated dizziness.  MRI imaging confirmed presence of acute cerebellar infarct and CT angiogram of the head and neck was significant for left vertebral artery occlusion.  Patient was admitted for hypertensive emergency and acute stroke, initially requiring ICU admission and Cleviprex drip.  For antihypertensives restarted and Cleviprex weaned off.  Patient with intractable vertigo/nausea secondary to acute infarct.  Physical therapy recommending acute inpatient rehab on discharge, however no plan for discharge to SNF. 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  MRI confirms acute infarct of medial aspect of left cerebellar hemisphere in addition to likely acute/subacute infarct of the medial aspect of the right occipital lobe.  CTA head and neck significant for an occluded left vertebral artery in the V2 segment.  LDL of 122.  Hemoglobin A1C of 5.6%.  2D echo shows preserved EF significant for no atrial level shunt.  Neurology was consulted. Initially recommended to take aspirin and Plavix for 3 months followed by aspirin. Patient developed GI bleed and therefore Plavix have been discontinued based on the discussion with neurology despite GI clearing the patient to take Plavix. PT/OT initially recommended acute inpatient rehabilitation, now with plan to discharge to SNF. Patient having difficulty working with physical therapy due to dizziness and nausea.   Hypertensive emergency Per history, patient's outpatient antihypertensives discontinued  in December. Patient managed initially on Cleviprex and able to wean off. Hydralazine held secondary to GI bleeding.  Continue amlodipine 10 mg daily and metoprolol   Acute GI bleeding Patient with acute blood loss anemia with FOBT positive stool. Associated elevated BUN. GI consulted. CT abdomen/pelvis obtained and suggests possible ulceration of the pylorus/descending portion of duodenum. Upper endoscopy performed on 3/4 and  was significant for esophageal/gastric and duodenal ulcers; one duodenal ulcer with stigmata of recent bleeding which was treated with Purastat. Continue aspirin.  Tolerating oral diet. Was on Protonix drip, currently on p.o. twice daily.   Acute blood loss anemia Secondary to GI bleeding. Hemoglobin of 14.6 on admission. Hemoglobin down to 6.7 today. Patient consented for blood transfusion. Hemoglobin up to 10 and stable.  Intractable nausea/vomiting Intractable vertigo Secondary to acute stroke. Therapies have not been successful in treating symptoms Patient was on scheduled Reglan without any benefit.  Patient was also on scheduled meclizine without any benefit. Currently on Thorazine 4 times daily. Orthostatic vitals ordered but not performed yet.  Will reorder.  Abdominal pain Unclear etiology but concern this is likely related to GI bleeding and anemia. Stat CT abdomen/pelvis obtained and confirmed concern for gastric/duodenal ulcer without evidence of perforation. GI consulted for GI bleeding. X-ray abdomen unremarkable.   Leukocytosis No source for infection noted; urinalysis not suggestive of infection. Chest x-ray without cardiopulmonary disease. Initial leukocytosis and downtrend, now with elevation with peak of 20,400 predominantly secondary to neutrophilia.   Hypokalemia Monitor and replace.   Acute urinary retention Unclear etiology. Possibly related to patient essentially being bed-bound. Foley catheter placed on 3/2 secondary to severe urinary  retention.  Catheter removed on 3/8.  Patient did have some retention requiring intermittent catheterization x 1.  If retaining more than 300 mL will place  Foley catheter again.   Lactic acidosis Present on admission. Improved with IV fluids.  Right breast nodule/mass 19 mm mixed density nodule/mass noted on CT chest. Patient with a history of right breast cancer s/p lumpectomy. Patient will need follow-up for nodule with mammography.  Goals of care conversation. 3/8 discussed with the patient with regards to her goals of care.  She wants to continue to get better continue to work with physical therapy.  And motivated.   Subjective: Continues to report all symptoms of nausea vertigo and hiccups unchanged from yesterday.  No fever no chills.  Physical Exam: Clear to auscultation. Bowel sound present.  Diffuse tenderness present which resolved on reevaluation without any intervention within the second. Alert awake and oriented. No aphasia. Generalized weakness.  Left-sided ataxia.  Data Reviewed: I have Reviewed nursing notes, Vitals, and Lab results. CBG reviewed.  Disposition: Status is: Inpatient Remains inpatient appropriate because: Need for improvement in symptoms so that the patient can work with therapy.  Place and maintain sequential compression device Start: 05/02/22 1103 SCDs Start: 04/25/22 1809   Family Communication: No one at bedside Level of care: Telemetry Medical continue due to stroke Vitals:   05/09/22 0328 05/09/22 0742 05/09/22 1110 05/09/22 1536  BP: (!) 122/49 (!) 128/54 (!) 119/56 (!) 138/59  Pulse: 78 73 77 74  Resp: '16 16 16 18  '$ Temp: 98.4 F (36.9 C) 98 F (36.7 C) 97.8 F (36.6 C) 97.9 F (36.6 C)  TempSrc: Oral Oral Oral Oral  SpO2: 95% 95% 97% 98%  Weight:      Height:         Author: Berle Mull, MD 05/09/2022 6:31 PM  Please look on [email or web address] to find out who is on call.

## 2022-05-10 LAB — CBC
HCT: 28.8 % — ABNORMAL LOW (ref 36.0–46.0)
Hemoglobin: 9.2 g/dL — ABNORMAL LOW (ref 12.0–15.0)
MCH: 30 pg (ref 26.0–34.0)
MCHC: 31.9 g/dL (ref 30.0–36.0)
MCV: 93.8 fL (ref 80.0–100.0)
Platelets: 426 10*3/uL — ABNORMAL HIGH (ref 150–400)
RBC: 3.07 MIL/uL — ABNORMAL LOW (ref 3.87–5.11)
RDW: 13.2 % (ref 11.5–15.5)
WBC: 7.8 10*3/uL (ref 4.0–10.5)
nRBC: 0 % (ref 0.0–0.2)

## 2022-05-10 LAB — GLUCOSE, CAPILLARY
Glucose-Capillary: 109 mg/dL — ABNORMAL HIGH (ref 70–99)
Glucose-Capillary: 152 mg/dL — ABNORMAL HIGH (ref 70–99)
Glucose-Capillary: 86 mg/dL (ref 70–99)
Glucose-Capillary: 174 mg/dL — ABNORMAL HIGH (ref 70–99)

## 2022-05-10 LAB — BASIC METABOLIC PANEL
Anion gap: 11 (ref 5–15)
BUN: 14 mg/dL (ref 8–23)
CO2: 26 mmol/L (ref 22–32)
Calcium: 8.9 mg/dL (ref 8.9–10.3)
Chloride: 102 mmol/L (ref 98–111)
Creatinine, Ser: 0.63 mg/dL (ref 0.44–1.00)
GFR, Estimated: >60 mL/min (ref >60)
Glucose, Bld: 106 mg/dL — ABNORMAL HIGH (ref 70–99)
Potassium: 3.8 mmol/L (ref 3.5–5.1)
Sodium: 139 mmol/L (ref 135–145)

## 2022-05-10 MED ORDER — CHLORPROMAZINE HCL 25 MG PO TABS
25.0000 mg | ORAL_TABLET | Freq: Three times a day (TID) | ORAL | Status: DC
  Administered 2022-05-10 – 2022-05-12 (×7): 25 mg via ORAL
  Filled 2022-05-10 (×8): qty 1

## 2022-05-10 MED ORDER — SODIUM CHLORIDE 0.9 % IV SOLN: INTRAVENOUS | Status: AC

## 2022-05-10 MED ORDER — SODIUM CHLORIDE 0.9 % IV SOLN: INTRAVENOUS | Status: DC

## 2022-05-10 NOTE — Progress Notes (Addendum)
Physical Therapy Treatment Patient Details Name: Sherry [name] MRN: [id number] DOB: [birth year] Today's Date: 05/10/2022   History of Present Illness Pt is an 89 yo female presenting 2/25 with nausea and headache, CT angiogram showed occlusion of the left vertebral artery and MRI showed left cerebellar stroke with a possible small area of restricted diffusion in the right occipital lobe. PMH includes: uncontrolled HTN, and recent fall with concussion.    PT Comments    Pt making good progress towards her physical therapy goals today, exhibiting improved activity tolerance. Continues with dizziness, however, denies nausea. Continued cueing for maintaining gaze stabilization throughout session. Pt requiring two person moderate assist for step pivot transfers from bed to chair. Worked on serial sit to stands from chair for functional strengthening. Pt demonstrates ataxia, LUE weakness, impaired sitting/standing balance and decreased coordination. Will benefit from post acute rehab to address deficits and maximize functional mobility.    Recommendations for follow up therapy are one component of a multi-disciplinary discharge planning process, led by the attending physician.  Recommendations may be updated based on patient status, additional functional criteria and insurance authorization.  Follow Up Recommendations  Skilled nursing-short term rehab (<3 hours/day) Can patient physically be transported by private vehicle: No   Assistance Recommended at Discharge Frequent or constant Supervision/Assistance  Patient can return home with the following Two people to help with walking and/or transfers;Two people to help with bathing/dressing/bathroom;Assistance with cooking/housework;Direct supervision/assist for medications management;Assistance with feeding;Direct supervision/assist for financial management;Assist for transportation;Help with stairs or ramp for entrance   Equipment Recommendations   Other (comment) (defer to post acute)    Recommendations for Other Services       Precautions / Restrictions Precautions Precautions: Fall;Other (comment) Precaution Comments: dizziness Restrictions Weight Bearing Restrictions: No     Mobility  Bed Mobility Overal bed mobility: Needs Assistance Bed Mobility: Supine to Sit     Supine to sit: Min assist     General bed mobility comments: Pt initiating well, bringing BLE's off edge of bed (with decreased coordination), minA at trunk to steady when elevating to sitting position    Transfers Overall transfer level: Needs assistance Equipment used: Rolling walker (2 wheels) Transfers: Sit to/from Stand, Bed to chair/wheelchair/BSC Sit to Stand: Mod assist, +2 physical assistance Stand pivot transfers: Mod assist, +2 physical assistance         General transfer comment: ModA + 2 to rise up to walker and take pivotal steps over to chair. Cues for hand placement    Ambulation/Gait                   Stairs             Wheelchair Mobility    Modified Rankin (Stroke Patients Only) Modified Rankin (Stroke Patients Only) Pre-Morbid Rankin Score: No symptoms Modified Rankin: Severe disability     Balance Overall balance assessment: Needs assistance Sitting-balance support: Feet supported, Single extremity supported Sitting balance-Leahy Scale: Fair Sitting balance - Comments: supervision-min guard   Standing balance support: Bilateral upper extremity supported, During functional activity Standing balance-Leahy Scale: Poor Standing balance comment: relies on external support                            Cognition Arousal/Alertness: Awake/alert Behavior During Therapy: Flat affect Overall Cognitive Status: Impaired/Different from baseline Area of Impairment: Problem solving, Awareness, Attention, Following commands, Memory  Current Attention Level: Sustained Memory:  Decreased short-term memory Following Commands: Follows one step commands consistently, Follows one step commands with increased time   Awareness: Emergent Problem Solving: Slow processing, Decreased initiation, Difficulty sequencing, Requires verbal cues, Requires tactile cues          Exercises Other Exercises Other Exercises: x3 sit to stands for functional strengthening    General Comments        Pertinent Vitals/Pain Pain Assessment Pain Assessment: Faces Faces Pain Scale: No hurt    Home Living                          Prior Function            PT Goals (current goals can now be found in the care plan section) Acute Rehab PT Goals Time For Goal Achievement: 05/24/22 Potential to Achieve Goals: Fair Progress towards PT goals: Progressing toward goals    Frequency    Min 3X/week      PT Plan Current plan remains appropriate    Co-evaluation              AM-PAC PT "6 Clicks" Mobility   Outcome Measure  Help needed turning from your back to your side while in a flat bed without using bedrails?: A Little Help needed moving from lying on your back to sitting on the side of a flat bed without using bedrails?: A Little Help needed moving to and from a bed to a chair (including a wheelchair)?: A Lot Help needed standing up from a chair using your arms (e.g., wheelchair or bedside chair)?: A Lot Help needed to walk in hospital room?: Total Help needed climbing 3-5 steps with a railing? : Total 6 Click Score: 12    End of Session Equipment Utilized During Treatment: Gait belt Activity Tolerance: Patient tolerated treatment well Patient left: in chair;with call bell/phone within reach;with chair alarm set Nurse Communication: Mobility status PT Visit Diagnosis: Other abnormalities of gait and mobility (R26.89);Muscle weakness (generalized) (M62.81);History of falling (Z91.81);Difficulty in walking, not elsewhere classified (R26.2);Unsteadiness  on feet (R26.81);Dizziness and giddiness (R42)     Time: [id number] PT Time Calculation (min) (ACUTE ONLY): 18 min  Charges:  $Therapeutic Activity: 8-22 mins                     Sherry [name], PT, DPT Acute Rehabilitation Services Office [phone]    Deno Etienne 05/10/2022, 1:48 PM

## 2022-05-10 NOTE — Progress Notes (Signed)
Mobility Specialist Progress Note   05/10/22 1218  Orthostatic Lying   BP- Lying 128/59  Pulse- Lying 73  Orthostatic Sitting  BP- Sitting 126/67  Pulse- Sitting 75  Orthostatic Standing at 0 minutes  BP- Standing at 0 minutes 107/44  Pulse- Standing at 0 minutes 80  Orthostatic Standing at 3 minutes  Pulse- Standing at 3 minutes  (patient couldn't tolerate standing long enough)  Mobility  Activity Transferred from chair to bed  Level of Assistance +2 (takes two people) (ModA)  Assistive Device Other (Comment) (HHA)  Distance Ambulated (ft) 2 ft  Activity Response Tolerated well  Mobility Referral Yes  $Mobility charge 1 Mobility   Pt requesting assistance to get from chair to bed d/t increased general discomfort. As we sit pt more upright in chair pt starts having syncopal bouts and responding less to our verbal cues expressing that she was getting dizzy, leaned pt back in chair for comfort. After dizziness subsided, pt required +2A minA to stand and pivot back to bed, pt able to stand for ~62mns to get standing BP. +2A needed to get back supine in bed for trunk and LE support. No further c/o dizziness expressed during transfer. Call bell in reach, bed alarm on and RN still present in room.     JHolland FallingMobility Specialist Please contact via SecureChat or  Rehab office at [phone]

## 2022-05-10 NOTE — Progress Notes (Signed)
Speech Language Pathology Treatment: Cognitive-Linquistic  Patient Details Name: LAKELYN FOCHTMAN MRN: [id number] DOB: 1930-10-06 Today's Date: 05/10/2022 Time: [id number] SLP Time Calculation (min) (ACUTE ONLY): 9 min  Assessment / Plan / Recommendation Clinical Impression  Pt asked to complete a task involving reading a label for aspirin and answering questions about correct dosage and use. She achieved 60% accuracy independently. Given Mod verbal and visual cues, her accuracy increased to 100%. Pt continues to demonstrate difficulty with awareness and self-monitoring as she did not recognize making errors, even when given cues for correct answers. Overall, attention and awareness seem to have the greatest impact on pt's ability to complete higher level cognitive tasks. These factors continue to affect pt's level of effort as the level of difficulty increases. Recommend ongoing SLP f/u to address functional activities and provide education for self-monitoring strategies.    HPI HPI: Pt is an 89 yo female presenting to ED with reports of nausea, vomiting, and headache. MRI Brain showed an acute infarct in the medial aspect of the L cerebellar hemisphere. PMH includes HTN, SDH, multiple falls, and squamous cell carcinoma.      SLP Plan  Continue with current plan of care      Recommendations for follow up therapy are one component of a multi-disciplinary discharge planning process, led by the attending physician.  Recommendations may be updated based on patient status, additional functional criteria and insurance authorization.    Recommendations                   Follow Up Recommendations: Skilled nursing-short term rehab (<3 hours/day) Assistance recommended at discharge: Frequent or constant Supervision/Assistance SLP Visit Diagnosis: Cognitive communication deficit [id number]) Plan: Continue with current plan of care       Fabio Asa., Student SLP  05/10/2022, 4:25 PM

## 2022-05-10 NOTE — Progress Notes (Signed)
[facility] Progress Note Patient: Sherry [name] [id number] DOB: 1930/09/07 DOA: 04/25/2022  DOS: the patient was seen and examined on 05/10/2022  Brief hospital course: Sherry [name] is a 89 y.o. female with a history of hypertension, recurrent falls, breast cancer status postlumpectomy and radiation.  Patient presented secondary to headache, nausea, vomiting with associated dizziness.  MRI imaging confirmed presence of acute cerebellar infarct and CT angiogram of the head and neck was significant for left vertebral artery occlusion.  Patient was admitted for hypertensive emergency and acute stroke, initially requiring ICU admission and Cleviprex drip.  For antihypertensives restarted and Cleviprex weaned off.  Patient with intractable vertigo/nausea secondary to acute infarct.  Physical therapy recommending acute inpatient rehab on discharge, however no plan for discharge to SNF. 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  MRI confirms acute infarct of medial aspect of left cerebellar hemisphere in addition to likely acute/subacute infarct of the medial aspect of the right occipital lobe.  CTA head and neck significant for an occluded left vertebral artery in the V2 segment.  LDL of 122.  Hemoglobin A1C of 5.6%.  2D echo shows preserved EF significant for no atrial level shunt.  Neurology was consulted. Initially recommended to take aspirin and Plavix for 3 months followed by aspirin. Patient developed GI bleed and therefore Plavix have been discontinued based on the discussion with neurology despite GI clearing the patient to take Plavix. PT/OT initially recommended acute inpatient rehabilitation, now with plan to discharge to SNF. Patient having difficulty working with physical therapy due to dizziness and nausea.   Hypertensive emergency Per history, patient's outpatient antihypertensives discontinued  in December. Patient managed initially on Cleviprex and able to wean off. Hydralazine held secondary to GI bleeding.  Continue amlodipine 10 mg daily and metoprolol Currently orthostatic. I will give IV fluid and monitor. Next option Would be reducing the dose of the amlodipine.   Acute GI bleeding Patient with acute blood loss anemia with FOBT positive stool. Associated elevated BUN. GI consulted. CT abdomen/pelvis obtained and suggests possible ulceration of the pylorus/descending portion of duodenum. Upper endoscopy performed on 3/4 and  was significant for esophageal/gastric and duodenal ulcers; one duodenal ulcer with stigmata of recent bleeding which was treated with Purastat. Continue aspirin.  Tolerating oral diet. Was on Protonix drip, currently on p.o. twice daily.   Acute blood loss anemia Secondary to GI bleeding. Hemoglobin of 14.6 on admission. Hemoglobin down to 6.7 today. Patient consented for blood transfusion. Hemoglobin up to 10 and stable.   Intractable nausea/vomiting Intractable vertigo Secondary to acute stroke. Therapies have not been successful in treating symptoms Patient was on scheduled Reglan without any benefit.  Patient was also on scheduled meclizine without any benefit. Currently on Thorazine 4 times daily. Orthostatic vitals ordered but not performed yet.  Will reorder.   Abdominal pain Unclear etiology but concern this is likely related to GI bleeding and anemia. Stat CT abdomen/pelvis obtained and confirmed concern for gastric/duodenal ulcer without evidence of perforation. GI consulted for GI bleeding. X-ray abdomen unremarkable.   Leukocytosis No source for infection noted; urinalysis not suggestive of infection. Chest x-ray without cardiopulmonary disease. Initial leukocytosis and downtrend, now with elevation with peak of 20,400 predominantly secondary to neutrophilia.   Hypokalemia Monitor and replace.   Acute urinary retention Unclear  etiology. Possibly related to patient essentially being bed-bound. Foley catheter placed on 3/2 secondary to severe urinary retention.  Catheter removed on  3/8.  Patient did have some retention requiring intermittent catheterization x 1.  If retaining more than 300 mL will place Foley catheter again.   Lactic acidosis Present on admission. Improved with IV fluids.   Right breast nodule/mass 19 mm mixed density nodule/mass noted on CT chest. Patient with a history of right breast cancer s/p lumpectomy. Patient will need follow-up for nodule with mammography.   Goals of care conversation. 3/8 discussed with the patient with regards to her goals of care.  She wants to continue to get better continue to work with physical therapy.  And motivated.   Subjective: Continues to have dizziness limiting her ability to work with therapy.  No abdominal pain no nausea no vomiting.  Physical Exam: General: in Mild distress, No Rash Cardiovascular: S1 and S2 Present, No Murmur Respiratory: Good respiratory effort, Bilateral Air entry present. No Crackles, No wheezes Abdomen: Bowel Sound present, No tenderness Extremities: No edema Neuro: Alert and oriented x3, no new focal deficit, dysmetria on the left.  Data Reviewed: I have Reviewed nursing notes, Vitals, and Lab results. Since last encounter, pertinent lab results CBC and BMP   . I have ordered test including CBC and BMP  .   Disposition: Status is: Inpatient Remains inpatient appropriate because: Need for improvement in ability to work with therapy versus goals of care conversation.  Place and maintain sequential compression device Start: 05/02/22 1103 SCDs Start: 04/25/22 1809   Family Communication: No one at bedside Level of care: Telemetry Medical   Vitals:   05/10/22 0500 05/10/22 0858 05/10/22 1105 05/10/22 1547  BP:  (!) 140/60 (!) 137/51 (!) 117/54  Pulse:  68  71  Resp:  16  16  Temp:  98.2 F (36.8 C) 98 F (36.7 C)    TempSrc:      SpO2:  97% 99% 97%  Weight: 48.8 kg     Height:         Author: Berle Mull, MD 05/10/2022 5:21 PM  Please look on [email or web address] to find out who is on call.

## 2022-05-11 LAB — GLUCOSE, CAPILLARY
Glucose-Capillary: 90 mg/dL (ref 70–99)
Glucose-Capillary: 105 mg/dL — ABNORMAL HIGH (ref 70–99)
Glucose-Capillary: 114 mg/dL — ABNORMAL HIGH (ref 70–99)
Glucose-Capillary: 122 mg/dL — ABNORMAL HIGH (ref 70–99)

## 2022-05-11 MED ORDER — SODIUM CHLORIDE 0.9 % IV BOLUS
1000.0000 mL | Freq: Once | INTRAVENOUS | Status: AC
  Administered 2022-05-11: 1000 mL via INTRAVENOUS

## 2022-05-11 NOTE — TOC Progression Note (Signed)
Transition of Care [facility]) - Progression Note    Patient Details  Name: TYERRA DIEMERT MRN: [id number] Date of Birth: [birth year]  Transition of Care [facility]) CM/SW [place], [place] Phone Number: 05/11/2022, 12:45 PM  Clinical Narrative:   CSW notified that patient much more participatory over the past 24 hours, able to sit up in the chair yesterday and participate with ADLs today. CSW sent updated therapy notes to [facility] for review, awaiting response. CSW to follow.    Expected Discharge Plan: [place] Barriers to Discharge: Continued Medical Work up  Expected Discharge Plan and [place] Choice: [place] Living arrangements for the past 2 months: Single Family Home                                       Social Determinants of Health (SDOH) Interventions SDOH Screenings   Food Insecurity: No Food Insecurity (05/05/2022)  Housing: [place]  (05/05/2022)  Transportation Needs: No Transportation Needs (05/05/2022)  Utilities: Not At Risk (05/05/2022)  Tobacco Use: Low Risk  (05/06/2022)    Readmission Risk Interventions    01/07/2022    2:50 PM  Readmission Risk Prevention Plan  Post Dischage Appt Complete  Medication Screening Complete  Transportation Screening Complete

## 2022-05-11 NOTE — Progress Notes (Signed)
Occupational Therapy Treatment Patient Details Name: Sherry [name] MRN: [id number] DOB: 13-Sep-1930 Today's Date: 05/11/2022   History of present illness Pt is an 89 yo female presenting 2/25 with nausea and headache, CT angiogram showed occlusion of the left vertebral artery and MRI showed left cerebellar stroke with a possible small area of restricted diffusion in the right occipital lobe. PMH includes: uncontrolled HTN, and recent fall with concussion.   OT comments  Patient progressing towards goals today.  Tolerates sitting ADLs with min to max assist required, but maintaining sitting balance with min guard to supervision.  Becomes orthostatic with standing, see below for details.  Engaging L UE more with encouragement, weak and poor coordination but initiating use of UE during functional tasks. Pt encouraged with her progress today! Will follow acutely.   BP supine 124/50 BP sitting 119/52 BP standing 84/64    Recommendations for follow up therapy are one component of a multi-disciplinary discharge planning process, led by the attending physician.  Recommendations may be updated based on patient status, additional functional criteria and insurance authorization.    Follow Up Recommendations  Skilled nursing-short term rehab (<3 hours/day)     Assistance Recommended at Discharge Frequent or constant Supervision/Assistance  Patient can return home with the following  Two people to help with walking and/or transfers;A lot of help with bathing/dressing/bathroom;Assistance with cooking/housework;Direct supervision/assist for medications management;Direct supervision/assist for financial management;Assist for transportation;Help with stairs or ramp for entrance   Equipment Recommendations  Other (comment) (defer)    Recommendations for Other Services      Precautions / Restrictions Precautions Precautions: Fall;Other (comment) Precaution Comments: dizziness, watch  BP Restrictions Weight Bearing Restrictions: No       Mobility Bed Mobility Overal bed mobility: Needs Assistance Bed Mobility: Rolling, Sidelying to Sit, Sit to Sidelying Rolling: Min assist Sidelying to sit: Mod assist, HOB elevated     Sit to sidelying: Max assist, +2 for physical assistance, +2 for safety/equipment General bed mobility comments: cueing for technique with rolling towards L side, mod assist to ascend trunk.  After standing and orthostatic, pt requires max assist to return supine. Transitioned back to EOB with mod assist after symptoms resolved    Transfers Overall transfer level: Needs assistance Equipment used: 2 person hand held assist Transfers: Bed to chair/wheelchair/BSC, Sit to/from Stand Sit to Stand: Mod assist, +2 physical assistance, +2 safety/equipment   Squat pivot transfers: Mod assist, +2 physical assistance, +2 safety/equipment       General transfer comment: standing with + orthostatic hypotension and symptomatic, squat pivot to recliner with mod assist +2 to increase upright tolerance     Balance Overall balance assessment: Needs assistance Sitting-balance support: No upper extremity supported, Feet supported Sitting balance-Leahy Scale: Fair Sitting balance - Comments: min guard fading to supervision, intermittent cueing for midline positioning Postural control: Left lateral lean Standing balance support: Bilateral upper extremity supported, During functional activity Standing balance-Leahy Scale: Poor Standing balance comment: relies on external support                           ADL either performed or assessed with clinical judgement   ADL Overall ADL's : Needs assistance/impaired     Grooming: Minimal assistance;Wash/dry hands;Wash/dry face;Oral care;Sitting Grooming Details (indicate cue type and reason): at sink         Upper Body Dressing : Moderate assistance;Sitting   Lower Body Dressing: Total assistance;+2  for physical assistance;+2 for safety/equipment;Sitting/lateral leans;Sit  to/from Archivist: Moderate assistance;+2 for physical assistance;+2 for safety/equipment;Squat-pivot Toilet Transfer Details (indicate cue type and reason): to reclienr         Functional mobility during ADLs: Moderate assistance;+2 for physical assistance;+2 for safety/equipment General ADL Comments: pt limited by dizziness and orthostatic hypotension, maintained sitting during session and pt able to participate well!    Extremity/Trunk Assessment              Vision       Perception     Praxis      Cognition Arousal/Alertness: Awake/alert Behavior During Therapy: Flat affect Overall Cognitive Status: Impaired/Different from baseline Area of Impairment: Attention, Memory, Following commands, Safety/judgement, Awareness, Problem solving                   Current Attention Level: Sustained Memory: Decreased recall of precautions, Decreased short-term memory Following Commands: Follows one step commands consistently, Follows one step commands with increased time, Follows multi-step commands inconsistently Safety/Judgement: Decreased awareness of safety, Decreased awareness of deficits Awareness: Emergent Problem Solving: Slow processing, Decreased initiation, Difficulty sequencing, Requires verbal cues, Requires tactile cues General Comments: patient agreeable to OT session, remains with flat affect but with after verbalizing pts progress to her she smiles,  Initates needs with grooming at sink.        Exercises      Shoulder Instructions       General Comments      Pertinent Vitals/ Pain       Pain Assessment Pain Assessment: Faces Faces Pain Scale: No hurt Pain Intervention(s): Monitored during session  Home Living                                          Prior Functioning/Environment              Frequency  Min 2X/week         Progress Toward Goals  OT Goals(current goals can now be found in the care plan section)  Progress towards OT goals: Progressing toward goals  Acute Rehab OT Goals Patient Stated Goal: feel better OT Goal Formulation: With patient Time For Goal Achievement: 05/10/22 Potential to Achieve Goals: [place] Discharge plan remains appropriate;Frequency remains appropriate    Co-evaluation                 AM-PAC OT "6 Clicks" Daily Activity     Outcome Measure   Help from another person eating meals?: A Little Help from another person taking care of personal grooming?: A Little Help from another person toileting, which includes using toliet, bedpan, or urinal?: Total Help from another person bathing (including washing, rinsing, drying)?: A Lot Help from another person to put on and taking off regular upper body clothing?: A Lot Help from another person to put on and taking off regular lower body clothing?: Total 6 Click Score: 12    End of Session Equipment Utilized During Treatment: Gait belt  OT Visit Diagnosis: Other abnormalities of gait and mobility (R26.89);Muscle weakness (generalized) (M62.81);Dizziness and giddiness (R42);Other symptoms and signs involving the nervous system (R29.898)   Activity Tolerance Patient tolerated treatment well   Patient Left in chair;with call bell/phone within reach;with chair alarm set;with family/visitor present   Nurse Communication Mobility status;Other (comment) (BP)        Time: [id number] OT Time Calculation (min): 35 min  Charges: OT General Charges $OT Visit: 1 Visit OT Treatments $Self Care/Home Management : 23-37 mins  [place] [phone]   Delight Stare 05/11/2022, 11:54 AM

## 2022-05-11 NOTE — Progress Notes (Signed)
[facility] Progress Note Patient: DALEA HENLY [id number] DOB: 07-22-1930 DOA: 04/25/2022  DOS: the patient was seen and examined on 05/11/2022  Brief hospital course: BATUL MENTING is a 89 y.o. female with a history of hypertension, recurrent falls, breast cancer status postlumpectomy and radiation.  Patient presented secondary to headache, nausea, vomiting with associated dizziness.  MRI imaging confirmed presence of acute cerebellar infarct and CT angiogram of the head and neck was significant for left vertebral artery occlusion.  Patient was admitted for hypertensive emergency and acute stroke, initially requiring ICU admission and Cleviprex drip.  For antihypertensives restarted and Cleviprex weaned off.  Patient with intractable vertigo/nausea secondary to acute infarct.  Physical therapy recommending acute inpatient rehab on discharge, however no plan for discharge to SNF.  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  MRI confirms acute infarct of medial aspect of left cerebellar hemisphere in addition to likely acute/subacute infarct of the medial aspect of the right occipital lobe.  CTA head and neck significant for an occluded left vertebral artery in the V2 segment.  LDL of 122.  Hemoglobin A1C of 5.6%.  2D echo shows preserved EF significant for no atrial level shunt.  Neurology was consulted. Initially recommended to take aspirin and Plavix for 3 months followed by aspirin. Patient developed GI bleed and therefore Plavix have been discontinued based on the discussion with neurology despite GI clearing the patient to take Plavix. PT/OT initially recommended acute inpatient rehabilitation, now with plan to discharge to SNF. Patient having difficulty working with physical therapy due to dizziness and nausea.   Hypertensive emergency Now orthostatic hypotension Per history, patient's outpatient  antihypertensives discontinued in December. Patient managed initially on Cleviprex and able to wean off. Hydralazine held secondary to GI bleeding.  Was on amlodipine and metoprolol. Currently orthostatic which did not improve with IV hydration.  Will provide further IV hydration and hold antihypertensive medications.   Acute GI bleeding Patient with acute blood loss anemia with FOBT positive stool. Associated elevated BUN. GI consulted. CT abdomen/pelvis obtained and suggests possible ulceration of the pylorus/descending portion of duodenum. Upper endoscopy performed on 3/4 and  was significant for esophageal/gastric and duodenal ulcers; one duodenal ulcer with stigmata of recent bleeding which was treated with Purastat. Continue aspirin.  Tolerating oral diet. Was on Protonix drip, currently on p.o. twice daily.   Acute blood loss anemia Secondary to GI bleeding. Hemoglobin of 14.6 on admission. Hemoglobin down to 6.7 today. Patient consented for blood transfusion. Hemoglobin up to 10 and stable.   Intractable nausea/vomiting Intractable vertigo Secondary to acute stroke. Therapies have not been successful in treating symptoms Patient was on scheduled Reglan without any benefit.  Patient was also on scheduled meclizine without any benefit. Currently on Thorazine 3 times daily.   Abdominal pain Unclear etiology but concern this is likely related to GI bleeding and anemia. Stat CT abdomen/pelvis obtained and confirmed concern for gastric/duodenal ulcer without evidence of perforation. GI consulted for GI bleeding. Repeat x-ray abdomen unremarkable.   Leukocytosis No source for infection noted; urinalysis not suggestive of infection. Chest x-ray without cardiopulmonary disease.    Hypokalemia Monitor and replace.   Acute urinary retention Unclear etiology. Possibly related to patient essentially being bed-bound. Foley catheter placed on 3/2 secondary to severe urinary retention.  Catheter  removed on 3/8.  Unable to successfully void and Foley catheter was reinserted on 3/10.  Will continue on discharge and outpatient urology  follow-up recommended.   Lactic acidosis Present on admission. Improved with IV fluids.   Right breast nodule/mass 19 mm mixed density nodule/mass noted on CT chest. Patient with a history of right breast cancer s/p lumpectomy. Patient will need follow-up for nodule with mammography.   Goals of care conversation. 3/8 discussed with the patient with regards to her goals of care.  She wants to continue to get better continue to work with physical therapy.  And motivated.   Subjective: Symptoms of dizziness are still present.  No nausea no vomiting no fever no chills.  No BM so far.  Physical Exam: Clear to auscultation. S1-S2 present. No abdominal pain.  Bowel sound present. No edema. Motor strength equal but left-sided dysmetria.  Data Reviewed: I have Reviewed nursing notes, Vitals, and Lab results. Will order CBC and BMP tomorrow.  Disposition: Status is: Inpatient Remains inpatient appropriate because: Need for improvement in ability to work with therapy versus goals of care conversation.  Place and maintain sequential compression device Start: 05/02/22 1103 SCDs Start: 04/25/22 1809   Family Communication: No one at bedside Level of care: Telemetry Medical   Vitals:   05/11/22 0729 05/11/22 0749 05/11/22 1539 05/11/22 2031  BP:  (!) 129/53 (!) 120/55 (!) 158/70  Pulse:  71 73 68  Resp:   16 16  Temp:  98.3 F (36.8 C) 98.8 F (37.1 C) 97.8 F (36.6 C)  TempSrc:  Oral Oral   SpO2:  97% 96% 98%  Weight: 58 kg     Height:         Author: Berle Mull, MD 05/11/2022 9:55 PM  Please look on [email or web address] to find out who is on call.

## 2022-05-12 ENCOUNTER — Telehealth: Payer: Self-pay | Admitting: Student

## 2022-05-12 LAB — CBC
HCT: 28 % — ABNORMAL LOW (ref 36.0–46.0)
Hemoglobin: 9.3 g/dL — ABNORMAL LOW (ref 12.0–15.0)
MCH: 30.2 pg (ref 26.0–34.0)
MCHC: 33.2 g/dL (ref 30.0–36.0)
MCV: 90.9 fL (ref 80.0–100.0)
Platelets: 447 10*3/uL — ABNORMAL HIGH (ref 150–400)
RBC: 3.08 MIL/uL — ABNORMAL LOW (ref 3.87–5.11)
RDW: 13 % (ref 11.5–15.5)
WBC: 8 10*3/uL (ref 4.0–10.5)
nRBC: 0 % (ref 0.0–0.2)

## 2022-05-12 LAB — BASIC METABOLIC PANEL
Anion gap: 8 (ref 5–15)
BUN: 11 mg/dL (ref 8–23)
CO2: 23 mmol/L (ref 22–32)
Calcium: 8.5 mg/dL — ABNORMAL LOW (ref 8.9–10.3)
Chloride: 106 mmol/L (ref 98–111)
Creatinine, Ser: 0.63 mg/dL (ref 0.44–1.00)
GFR, Estimated: >60 mL/min (ref >60)
Glucose, Bld: 101 mg/dL — ABNORMAL HIGH (ref 70–99)
Potassium: 3.9 mmol/L (ref 3.5–5.1)
Sodium: 137 mmol/L (ref 135–145)

## 2022-05-12 LAB — GLUCOSE, CAPILLARY
Glucose-Capillary: 122 mg/dL — ABNORMAL HIGH (ref 70–99)
Glucose-Capillary: 125 mg/dL — ABNORMAL HIGH (ref 70–99)
Glucose-Capillary: 116 mg/dL — ABNORMAL HIGH (ref 70–99)

## 2022-05-12 MED ORDER — ATORVASTATIN CALCIUM 40 MG PO TABS: 40.0000 mg | ORAL_TABLET | Freq: Every day | ORAL | 3 refills | Status: AC

## 2022-05-12 MED ORDER — ASPIRIN 81 MG PO TBEC: 81.0000 mg | DELAYED_RELEASE_TABLET | Freq: Every day | ORAL | 12 refills | Status: AC

## 2022-05-12 MED ORDER — ACETAMINOPHEN 325 MG PO TABS: 650.0000 mg | ORAL_TABLET | Freq: Four times a day (QID) | ORAL | Status: DC | PRN

## 2022-05-12 MED ORDER — LACTULOSE 10 GM/15ML PO SOLN
10.0000 g | Freq: Once | ORAL | Status: AC
  Administered 2022-05-12: 10 g via ORAL
  Filled 2022-05-12: qty 30

## 2022-05-12 MED ORDER — FLEET ENEMA 7-19 GM/118ML RE ENEM
1.0000 | ENEMA | Freq: Once | RECTAL | Status: AC
  Administered 2022-05-12: 1 via RECTAL
  Filled 2022-05-12: qty 1

## 2022-05-12 MED ORDER — SENNOSIDES-DOCUSATE SODIUM 8.6-50 MG PO TABS: 1.0000 | ORAL_TABLET | Freq: Two times a day (BID) | ORAL | Status: DC

## 2022-05-12 MED ORDER — PANTOPRAZOLE SODIUM 40 MG PO TBEC: DELAYED_RELEASE_TABLET | ORAL | 0 refills | Status: DC

## 2022-05-12 MED ORDER — TAMSULOSIN HCL 0.4 MG PO CAPS: 0.4000 mg | ORAL_CAPSULE | Freq: Every day | ORAL | 0 refills | Status: DC

## 2022-05-12 MED ORDER — ONDANSETRON HCL 4 MG PO TABS: ORAL_TABLET | ORAL | 1 refills | Status: DC

## 2022-05-12 NOTE — Progress Notes (Signed)
  Pt picked up by [facility] to d/c to [facility] facility.  Day shift RN Levada Dy and I removed her IV and tele box, and left foley in per order.   Vitals stable, and belongings sent with pt for transport.

## 2022-05-12 NOTE — Discharge Summary (Addendum)
Physician Discharge Summary   Patient: Sherry [name] MRN: [id number] DOB: 08/07/1930  Admit date:     04/25/2022  Discharge date: 05/12/22  Discharge Physician: Annita Brod   PCP: Rusty Aus, MD   Recommendations at discharge:   New medication: Tylenol 650 p.o. every 6 hours as needed New medication: Zofran tapering dose over the next week New medication: Lipitor 40 mg p.o. daily New medication: Aspirin 81 mg p.o. daily New medication: Vitamin D 2000 units p.o. daily (patient had stopped taking this medication) New medication: B12 1000 mcg p.o. daily (patient had stopped taking this medication) Patient being discharged to skilled nursing New medication: Senokot 1 tab p.o. twice daily New medication: Protonix 40 mg p.o. twice daily x 3 weeks, then changed to p.o. daily indefinitely New medication: Flomax 0.4 mg p.o. daily Patient to make appointment to follow-up with urology next week, as early as 3/18.  Discharge Diagnoses: Principal Problem:   CVA (cerebral vascular accident) [facility]) Active Problems:   Hypertensive emergency   Cerebellar stroke ([place])   Upper GI bleed   Anemia, posthemorrhagic, acute   Acute urinary retention   Vertigo following cerebrovascular accident   Leukocytosis   Lactic acidosis   Peptic ulcer   Antiplatelet or antithrombotic long-term use  Resolved Problems:   * No resolved hospital problems. *  Hospital Course: Sherry [name] is a 89 y.o. female with a history of hypertension, recurrent falls, breast cancer status postlumpectomy and radiation.  Patient presented secondary to headache, nausea, vomiting with associated dizziness.  MRI imaging confirmed presence of acute cerebellar infarct and CT angiogram of the head and neck was significant for left vertebral artery occlusion.  Patient was admitted for hypertensive emergency and acute stroke, initially requiring ICU admission and Cleviprex drip.  For antihypertensives restarted and  Cleviprex weaned off.  Patient with intractable vertigo/nausea secondary to acute infarct.  Physical therapy recommending acute inpatient rehab on discharge, however due to insurance approval, patient was approved to discharge to SNF.  Assessment and Plan: Acute left cerebellar infarct Left vertebral artery occlusion MRI confirms acute infarct of medial aspect of left cerebellar hemisphere in addition to likely acute/subacute infarct of the medial aspect of the right occipital lobe.  CTA head and neck significant for an occluded left vertebral artery in the V2 segment.  LDL of 122.  Hemoglobin A1C of 5.6%.  2D echo shows preserved EF significant for no atrial level shunt.  Neurology was consulted. Initially recommended to take aspirin and Plavix for 3 months followed by aspirin. Patient developed GI bleed and therefore Plavix have been discontinued based on the discussion with neurology despite GI clearing the patient to take Plavix. PT/OT initially recommended acute inpatient rehabilitation, now with plan to discharge to SNF. Patient having difficulty working with physical therapy due to dizziness and nausea.   Hypertensive emergency Per history, patient's outpatient antihypertensives discontinued in December. Patient managed initially on Cleviprex and able to wean off. Hydralazine held secondary to GI bleeding.  Continue amlodipine 10 mg daily and metoprolol, but with issues of orthostasis, Norvasc discontinued.   Acute GI bleeding Patient with acute blood loss anemia with FOBT positive stool. Associated elevated BUN. GI consulted. CT abdomen/pelvis obtained and suggests possible ulceration of the pylorus/descending portion of duodenum. Upper endoscopy performed on 3/4 and  was significant for esophageal/gastric and duodenal ulcers; one duodenal ulcer with stigmata of recent bleeding which was treated with Purastat. Continue aspirin.  Tolerating oral diet. Was on Protonix drip, currently  on  p.o. twice daily, x 3 weeks and then p.o. daily   Acute blood loss anemia Secondary to GI bleeding. Hemoglobin of 14.6 on admission. Hemoglobin down to 6.7 and patient transfused.  Hemoglobin stable between 9-10.   Intractable nausea/vomiting Intractable vertigo Secondary to acute stroke. Therapies have not been successful in treating symptoms Patient was on scheduled Reglan without any benefit.  Patient was also on scheduled meclizine without any benefit. Currently on Thorazine 4 times daily.  Have changed over to Zofran with scheduled taper over the next week.   Abdominal pain Unclear etiology but concern this is likely related to GI bleeding and anemia. Stat CT abdomen/pelvis obtained and confirmed concern for gastric/duodenal ulcer without evidence of perforation. GI consulted for GI bleeding.  As above. X-ray abdomen unremarkable.   Leukocytosis-resolved Likely stress margination no source for infection noted; urinalysis not suggestive of infection. Chest x-ray without cardiopulmonary disease. Initial leukocytosis and downtrend, now with elevation with peak of 20,400 predominantly secondary to neutrophilia.   Hypokalemia Monitor and replace.   Acute urinary retention Unclear etiology. Possibly related to patient essentially being bed-bound. Foley catheter placed on 3/2 secondary to severe urinary retention.  Catheter removed on 3/8.  Unable to successfully void and Foley catheter was reinserted on 3/10.  Will continue on discharge and outpatient urology follow-up recommended.  Discharge to skilled nursing with Foley catheter.  Patient will follow-up with urology next week for voiding trial.   Lactic acidosis Present on admission. Improved with IV fluids.   Right breast nodule/mass 19 mm mixed density nodule/mass noted on CT chest. Patient with a history of right breast cancer s/p lumpectomy. Patient will need follow-up for nodule with mammography.   Goals of care conversation. 3/8  discussed with the patient with regards to her goals of care.  She wants to continue to get better continue to work with physical therapy.  And motivated.   Consultants:  -Neurology -Gastroenterology -Critical care  Procedures performed:  -EGD -Blood transfused -Echocardiogram  Disposition: Skilled nursing Diet recommendation:  Discharge Diet Orders (From admission, onward)     Start     Ordered   05/12/22 0000  DIET SOFT     with boost breeze shakes 3 times daily between meals   05/12/22 1151           Soft diet with thin liquids and boost breeze 3 times daily between meals DISCHARGE MEDICATION: Allergies as of 05/12/2022       Reactions   Morphine    Other reaction(s): Other (See Comments) Apnea with large dose Respiratory Distress   Buprenorphine Hcl    Other reaction(s): Other (See Comments), Other (See Comments) Respiratory Distress Respiratory Distress   Codeine    Epinephrine    Other reaction(s): Other (See Comments) Severe anxiety; "makes my heart race" - would like to avoid during surgery   Morphine And Related Other (See Comments)   Respiratory Distress   Percocet [oxycodone-acetaminophen]         Medication List     STOP taking these medications    amLODipine 10 MG tablet Commonly known as: NORVASC   Biotin 5 MG Tabs   celecoxib 200 MG capsule Commonly known as: CELEBREX   gabapentin 100 MG capsule Commonly known as: NEURONTIN   hydrALAZINE 50 MG tablet Commonly known as: APRESOLINE   hydrochlorothiazide 25 MG tablet Commonly known as: HYDRODIURIL   irbesartan 300 MG tablet Commonly known as: AVAPRO   metoprolol succinate 25 MG 24 hr tablet  Commonly known as: TOPROL-XL   Oxycodone HCl 10 MG Tabs   pramipexole 0.125 MG tablet Commonly known as: MIRAPEX   sucralfate 1 g tablet Commonly known as: CARAFATE       TAKE these medications    acetaminophen 325 MG tablet Commonly known as: TYLENOL Take 2 tablets (650 mg  total) by mouth every 6 (six) hours as needed for headache. What changed:  medication strength how much to take reasons to take this   aspirin EC 81 MG tablet Take 1 tablet (81 mg total) by mouth daily. Swallow whole. Start taking on: May 13, 2022   atorvastatin 40 MG tablet Commonly known as: LIPITOR Take 1 tablet (40 mg total) by mouth daily. Start taking on: May 13, 2022   B-12 1000 MCG Caps Take 1,000 mcg by mouth daily.   cholecalciferol 25 MCG (1000 UT) tablet Generic drug: Cholecalciferol Take 2,000 Units by mouth daily.   ondansetron 4 MG tablet Commonly known as: Zofran Take every 8 hours x 2 days, then decrease to twice daily x 2 days, then daily x 2 days, then change every 6 hours as needed   pantoprazole 40 MG tablet Commonly known as: PROTONIX Take 1 tablet (40 mg total) by mouth 2 (two) times daily for 21 days, THEN 1 tablet (40 mg total) daily. Start taking on: May 12, 2022 What changed: See the new instructions.   senna-docusate 8.6-50 MG tablet Commonly known as: Senokot-S Take 1 tablet by mouth 2 (two) times daily.        Follow-up Information     [facility]. Schedule an appointment as soon as possible for a visit in 1 month(s).   Specialty: Neurology Why: stroke clinic Contact information: [place] [phone]        Rusty Aus, MD. Schedule an appointment as soon as possible for a visit in 2 week(s).   Specialty: Internal Medicine Why: Please call and make an appointment for 7-14 days after discharge Contact information: [address] [place] [id number] [phone]                Discharge Exam: Filed Weights   05/08/22 0702 05/10/22 0500 05/11/22 0729  Weight: 53.3 kg 48.8 kg 58 kg   General: Alert and oriented x 2, fatigued Cardiovascular: Regular rate and rhythm, S1-S2  Condition  at discharge: fair  The results of significant diagnostics from this hospitalization (including imaging, microbiology, ancillary and laboratory) are listed below for reference.   Imaging Studies: DG Abd Portable 1V  Result Date: 05/05/2022 CLINICAL DATA:  Abdominal discomfort. EXAM: PORTABLE ABDOMEN - 1 VIEW COMPARISON:  05/22/2021 and CT 05/02/2022 FINDINGS: Calcifications in the right abdomen may be associated with the known gallstones. Lung bases are clear. Nonobstructive bowel gas pattern. Surgical clip in the right hemipelvis. Limited evaluation for free air on the supine images. IMPRESSION: 1. Nonobstructive bowel gas pattern. 2. Cholelithiasis. Electronically Signed   By: Markus Daft M.D.   On: 05/05/2022 17:08   CT ABDOMEN PELVIS W CONTRAST  Result Date: 05/02/2022 CLINICAL DATA:  Abdominal pain, acute. Nonlocalized. History of duodenal perforation. EXAM: CT ABDOMEN AND PELVIS WITH CONTRAST TECHNIQUE: Multidetector CT imaging of the abdomen and pelvis was performed using the standard protocol following bolus administration of intravenous contrast. RADIATION DOSE REDUCTION: This exam was performed according to the departmental dose-optimization program which includes automated exposure control, adjustment of the mA and/or  kV according to patient size and/or use of iterative reconstruction technique. CONTRAST:  63m OMNIPAQUE IOHEXOL 350 MG/ML SOLN COMPARISON:  05/17/2021 FINDINGS: Lower chest: No acute abnormality. Hepatobiliary: No suspicious liver abnormality. Small cyst within central left liver measures 0.8 cm and is unchanged from previous exam. Lobe of gallbladder appears distended. There are 2 no gallbladder wall thickening. No bile duct dilatation. Stones within the gallbladder measuring up to 2 cm. Pancreas: Cyst in uncinate process measures 8 mm and is unchanged from the previous exam. No main duct dilatation, inflammation or distension. Spleen: Normal in size without focal abnormality.  Adrenals/Urinary Tract: Normal adrenal glands. No kidney mass, hydronephrosis, or nephrolithiasis. Urinary bladder is decompressed around a Foley catheter. Stomach/Bowel: There is wall thickening involving the pylorus and descending portion of the duodenum with surrounding soft tissue stranding. Transmural gas collection is identified within the anterior wall of the pylorus measuring 1.2 cm, image 34/3. On the previous exam this measures 1.1 cm. This may represent an area of transmural ulceration versus small duodenal diverticulum. No sign of perforation. The remaining small bowel loops are unremarkable. No pathologic dilatation of the large or small bowel loops. Sigmoid diverticulosis without acute diverticulitis. Moderate retained stool noted within the colon. Vascular/Lymphatic: Aortic atherosclerosis. No enlarged abdominal or pelvic lymph nodes. Reproductive: Status post hysterectomy. No adnexal masses. Other: No free fluid or focal fluid collections. Musculoskeletal: Lumbar spondylosis and facet arthropathy. No acute or suspicious osseous findings. Postoperative changes from prior lumpectomy noted within the right breast. Soft tissue density nodule within the right lower breast measures 1.7 cm, image 18/3 previously this measured 2.0 cm. IMPRESSION: 1. There is wall thickening involving the pylorus and descending portion of the duodenum with surrounding soft tissue stranding. Transmural gas collection is identified within the anterior wall of the pylorus measuring 1.2 cm. On the previous exam this measures 1.1 cm. This may represent an area of transmural ulceration versus small duodenal diverticulum. No sign of perforation. 2. Gallstones with distended gallbladder. No gallbladder wall thickening. 3. Sigmoid diverticulosis without acute diverticulitis. 4. Moderate retained stool noted within the colon. Correlate for any clinical signs or symptoms of constipation. 5. Stable 8 mm cystic lesion in the uncinate  process of the pancreas. Given the patient's age, no further workup or follow-up is recommended. 6. Soft tissue density nodule within the right lower breast measures 1.7 cm previously this measured 2.0 cm. 7.  Aortic Atherosclerosis (ICD10-I70.0). Electronically Signed   By: TKerby MoorsM.D.   On: 05/02/2022 10:39   DG CHEST PORT 1 VIEW  Result Date: 05/01/2022 CLINICAL DATA:  Leukocytosis. EXAM: PORTABLE CHEST 1 VIEW COMPARISON:  Chest radiograph dated 04/25/2022. FINDINGS: No focal consolidation, pleural effusion, pneumothorax. Linear density along the lateral left lung most consistent with a skin fold. Repeat radiograph with repositioning of the patient may provide better evaluation. The cardiac silhouette is within normal limits. No acute osseous pathology. IMPRESSION: 1. No acute cardiopulmonary process. 2. Probable skin fold along the lateral left lung. Electronically Signed   By: AAnner CreteM.D.   On: 05/01/2022 17:52   CT HEAD WO CONTRAST (5MM)  Result Date: 04/30/2022 CLINICAL DATA:  Stroke follow-up EXAM: CT HEAD WITHOUT CONTRAST TECHNIQUE: Contiguous axial images were obtained from the base of the skull through the vertex without intravenous contrast. RADIATION DOSE REDUCTION: This exam was performed according to the departmental dose-optimization program which includes automated exposure control, adjustment of the mA and/or kV according to patient size and/or use of iterative reconstruction technique.  COMPARISON:  CT Head 04/26/22, MRI Head 04/25/22 FINDINGS: Brain: Evolving left cerebellar infarct. No evidence of hemorrhage. No CT evidence of a new infarct. Redemonstrated is a subacute right occipital lobe infarct and a chronic right frontal lobe infarct. There is also a chronic infarct in the left caudate head. Sequela of moderate chronic microvascular ischemic change. No hydrocephalus. No extra-axial fluid collection. Vascular: No hyperdense vessel or unexpected calcification. Skull:  Normal. Negative for fracture or focal lesion. Sinuses/Orbits: No mastoid or middle ear effusion. Frothy secretions in the left sphenoid sinus in the posterior right ethmoid air cells, which can be seen in the setting of acute sinusitis. Bilateral lens replacement. Orbits are otherwise unremarkable. Other: None. IMPRESSION: Evolving left cerebellar infarct. No evidence of hemorrhage. No CT evidence of a new infarct. No hydrocephalus. Electronically Signed   By: Marin Roberts M.D.   On: 04/30/2022 12:38   ECHOCARDIOGRAM COMPLETE  Result Date: 04/26/2022    ECHOCARDIOGRAM REPORT   Patient Name:   ASCHLEY SALAY Date of Exam: 04/26/2022 Medical Rec #:  [id number]         Height:       62.0 in Accession #:    [id number]        Weight:       123.9 lb Date of Birth:  03/19/1930        BSA:          1.560 m Patient Age:    3 years          BP:           169/63 mmHg Patient Gender: F                 HR:           85 bpm. Exam Location:  Inpatient Procedure: 2D Echo, 3D Echo, Cardiac Doppler and Color Doppler Indications:    Stroke  History:        Patient has no prior history of Echocardiogram examinations.                 Abnormal ECG, Signs/Symptoms:Chest Pain and Dyspnea; Risk                 Factors:Hypertension and Dyslipidemia.  Sonographer:    Roseanna Rainbow RDCS Referring Phys: [id number] [place]  1. There is a mid cavity gradient of 61mHg related to hyperdynamic LVF and mid cavity obliteration during systole. Left ventricular ejection fraction, by estimation, is 70 to 75%. Left ventricular ejection fraction by 3D volume is 72 %. The left ventricle has hyperdynamic function. The left ventricle has no regional wall motion abnormalities. There is severe left ventricular hypertrophy of the basal segment. Left ventricular diastolic parameters are consistent with Grade I diastolic dysfunction (impaired relaxation).  2. Right ventricular systolic function is normal. The right ventricular size is  normal. There is mildly elevated pulmonary artery systolic pressure. The estimated right ventricular systolic pressure is 3Q000111QmmHg.  3. The mitral valve is normal in structure. Mild mitral valve regurgitation. No evidence of mitral stenosis.  4. The aortic valve is tricuspid. Aortic valve regurgitation is trivial. Aortic valve sclerosis/calcification is present, without any evidence of aortic stenosis. Aortic valve area, by VTI measures 3.65 cm. Aortic valve mean gradient measures 7.0 mmHg.  Aortic valve Vmax measures 1.79 m/s.  5. The inferior vena cava is normal in size with greater than 50% respiratory variability, suggesting right atrial pressure of 3 mmHg. Conclusion(s)/Recommendation(s): No intracardiac source  of embolism detected on this transthoracic study. Consider a transesophageal echocardiogram to exclude cardiac source of embolism if clinically indicated. FINDINGS  Left Ventricle: There is a mid cavity gradient of 12mHg related to hyperdynamic LVF and mid cavity obliteration during systole. Left ventricular ejection fraction, by estimation, is 70 to 75%. Left ventricular ejection fraction by 3D volume is 72 %. The left ventricle has hyperdynamic function. The left ventricle has no regional wall motion abnormalities. The left ventricular internal cavity size was normal in size. There is severe left ventricular hypertrophy of the basal segment. Left ventricular diastolic parameters are consistent with Grade I diastolic dysfunction (impaired relaxation). Normal left ventricular filling pressure. Right Ventricle: The right ventricular size is normal. No increase in right ventricular wall thickness. Right ventricular systolic function is normal. There is mildly elevated pulmonary artery systolic pressure. The tricuspid regurgitant velocity is 2.92  m/s, and with an assumed right atrial pressure of 3 mmHg, the estimated right ventricular systolic pressure is 3Q000111QmmHg. Left Atrium: Left atrial size was  normal in size. Right Atrium: Right atrial size was normal in size. Pericardium: There is no evidence of pericardial effusion. Mitral Valve: The mitral valve is normal in structure. Mild mitral valve regurgitation. No evidence of mitral valve stenosis. MV peak gradient, 7.6 mmHg. The mean mitral valve gradient is 3.0 mmHg. Tricuspid Valve: The tricuspid valve is normal in structure. Tricuspid valve regurgitation is mild . No evidence of tricuspid stenosis. Aortic Valve: The aortic valve is tricuspid. Aortic valve regurgitation is trivial. Aortic valve sclerosis/calcification is present, without any evidence of aortic stenosis. Aortic valve mean gradient measures 7.0 mmHg. Aortic valve peak gradient measures 12.8 mmHg. Aortic valve area, by VTI measures 3.65 cm. Pulmonic Valve: The pulmonic valve was normal in structure. Pulmonic valve regurgitation is not visualized. No evidence of pulmonic stenosis. Aorta: The aortic root is normal in size and structure. Venous: The inferior vena cava is normal in size with greater than 50% respiratory variability, suggesting right atrial pressure of 3 mmHg. IAS/Shunts: The interatrial septum appears to be lipomatous. No atrial level shunt detected by color flow Doppler.  LEFT VENTRICLE PLAX 2D LVIDd:         2.90 cm         Diastology LVIDs:         1.70 cm         LV e' medial:    5.11 cm/s LV PW:         1.10 cm         LV E/e' medial:  17.2 LV IVS:        1.60 cm         LV e' lateral:   9.36 cm/s LVOT diam:     2.30 cm         LV E/e' lateral: 9.4 LV SV:         106 LV SV Index:   68 LVOT Area:     4.15 cm        3D Volume EF                                LV 3D EF:    Left  ventricul                                             ar                                             ejection                                             fraction                                             by 3D                                              volume is                                             72 %.                                 3D Volume EF:                                3D EF:        72 %                                LV EDV:       87 ml                                LV ESV:       24 ml                                LV SV:        62 ml RIGHT VENTRICLE             IVC RV S prime:     11.50 cm/s  IVC diam: 1.50 cm TAPSE (M-mode): 1.5 cm LEFT ATRIUM           Index        RIGHT ATRIUM          Index LA diam:      2.60 cm 1.67 cm/m   RA Area:     7.08 cm LA Vol (A2C): 41.7 ml 26.74 ml/m  RA Volume:   11.30 ml 7.25 ml/m LA Vol (A4C): 13.4 ml 8.59 ml/m  AORTIC VALVE AV Area (Vmax):    3.41 cm AV Area (Vmean):  3.39 cm AV Area (VTI):     3.65 cm AV Vmax:           179.00 cm/s AV Vmean:          117.000 cm/s AV VTI:            0.289 m AV Peak Grad:      12.8 mmHg AV Mean Grad:      7.0 mmHg LVOT Vmax:         147.00 cm/s LVOT Vmean:        95.600 cm/s LVOT VTI:          0.254 m LVOT/AV VTI ratio: 0.88  AORTA Ao Root diam: 2.90 cm Ao Asc diam:  3.00 cm MITRAL VALVE                TRICUSPID VALVE MV Area (PHT): 2.48 cm     TR Peak grad:   34.1 mmHg MV Area VTI:   3.05 cm     TR Vmax:        292.00 cm/s MV Peak grad:  7.6 mmHg MV Mean grad:  3.0 mmHg     SHUNTS MV Vmax:       1.38 m/s     Systemic VTI:  0.25 m MV Vmean:      82.1 cm/s    Systemic Diam: 2.30 cm MV Decel Time: 307 msec MV E velocity: 88.10 cm/s MV A velocity: 125.50 cm/s MV E/A ratio:  0.70 Fransico Him MD Electronically signed by Fransico Him MD Signature Date/Time: 04/26/2022/12:12:26 PM    Final    DG Chest Port 1 View  Result Date: 04/26/2022 CLINICAL DATA:  CVA. EXAM: PORTABLE CHEST 1 VIEW COMPARISON:  Portable yesterday 2:50 p.m. FINDINGS: The heart size and mediastinal contours are within normal limits. There is aortic atherosclerosis. Both lungs are clear. The visualized skeletal structures are intact. The is advanced degenerative change of the shoulders with chronic  rotator cuff arthropathy. Right axillary surgical clips. IMPRESSION: No active disease. Aortic atherosclerosis. Electronically Signed   By: Telford Nab M.D.   On: 04/26/2022 06:36   CT HEAD WO CONTRAST (5MM)  Result Date: 04/26/2022 CLINICAL DATA:  89 year old female presenting with headache, vomiting, hypertension. 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 EXAM: CT HEAD WITHOUT CONTRAST TECHNIQUE: Contiguous axial images were obtained from the base of the skull through the vertex without intravenous contrast. RADIATION DOSE REDUCTION: This exam was performed according to the departmental dose-optimization program which includes automated exposure control, adjustment of the mA and/or kV according to patient size and/or use of iterative reconstruction technique. COMPARISON:  Brain MRI, head CT, CTA head and neck yesterday. FINDINGS: Brain: Cytotoxic edema in the left cerebellum PICA territory is now evident by CT corresponding to the MRI appearance. No hemorrhagic transformation. Basilar cisterns remain patent, no posterior fossa mass effect at this time. Supratentorial gray-white differentiation remains stable, no convincing cytotoxic edema elsewhere. Chronic small and medium-sized vessel ischemic changes in both hemispheres. No midline shift, ventriculomegaly, mass effect, evidence of mass lesion, intracranial hemorrhage. Vascular: Calcified atherosclerosis at the skull base. Skull: No acute osseous abnormality identified. Sinuses/Orbits: Stable mild paranasal sinus opacity, stables mild paranasal sinus bubbly opacity and mucosal thickening. Tympanic cavities and mastoids remain clear. Other: No acute orbit or scalp soft tissue finding. IMPRESSION: 1. Expected CT appearance of Left cerebellum PICA infarct. No hemorrhagic transformation or posterior fossa mass effect at this time. 2. No other acute intracranial abnormality  by CT. Chronic supratentorial small and medium-sized vessel  ischemia. Electronically Signed   By: Genevie Ann M.D.   On: 04/26/2022 05:37   DG Chest Port 1 View  Result Date: 04/25/2022 CLINICAL DATA:  Systemic inflammatory response syndrome. EXAM: PORTABLE CHEST 1 VIEW COMPARISON:  Chest radiographs 01/03/2022 FINDINGS: Cardiac silhouette and mediastinal contours are within normal limits. The lungs are clear. No pleural effusion or pneumothorax. Right axillary surgical clips are again seen.) Right-greater-than-left axillary calcific foci as seen on prior CT and unchanged from 01/03/2022 radiographs, likely calcified lymph nodes as can be seen following prior remote granulomatous infection. IMPRESSION: No active disease. Electronically Signed   By: Yvonne Kendall M.D.   On: 04/25/2022 15:09   MR BRAIN WO CONTRAST  Result Date: 04/25/2022 CLINICAL DATA:  Stroke suspected EXAM: MRI HEAD WITHOUT CONTRAST TECHNIQUE: Multiplanar, multiecho pulse sequences of the brain and surrounding structures were obtained without intravenous contrast. COMPARISON:  Same day CT Head and CTA head/neck FINDINGS: Brain: Acute infarcts in the medial aspect of the left cerebellar hemisphere. There is an additional subtle region of increased signal on diffusion-weighted imaging in the medial aspect of the right occipital lobe (series 5, image 27), favored to represent an additional site of an acute to subacute infarct. Sequela of moderate chronic microvascular ischemic change. No hemorrhage. No hydrocephalus. Vascular: See same day CTA head and neck angiogram for vascular findings. Redemonstrated absent flow void in the V4 segment of the left vertebral artery. Skull and upper cervical spine: Redemonstrated is a chronic appearing type 2 dens fracture. Sinuses/Orbits: Bilateral lens replacement. Trace right mastoid effusion. Trace mucosal thickening in the posterior ethmoid air cells on the right. Other: None. IMPRESSION: 1. 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 2.  Additional region of increased signal on diffusion-weighted imaging in the medial aspect of the right occipital lobe, favored to represent an additional site of an acute to subacute infarct. 3. Absent flow void in the V4 segment of the left vertebral artery, in keeping with known occlusion seen on same day CTA head/neck Electronically Signed   By: Marin Roberts M.D.   On: 04/25/2022 13:59   CT Angio Chest Aorta W and/or Wo Contrast  Result Date: 04/25/2022 CLINICAL DATA:  89 year old female with new onset headache, hypertension, vomiting. EXAM: CT ANGIOGRAPHY CHEST WITH CONTRAST TECHNIQUE: Multidetector CT imaging of the chest was performed using the standard protocol during bolus administration of intravenous contrast. Multiplanar CT image reconstructions and MIPs were obtained to evaluate the vascular anatomy. RADIATION DOSE REDUCTION: This exam was performed according to the departmental dose-optimization program which includes automated exposure control, adjustment of the mA and/or kV according to patient size and/or use of iterative reconstruction technique. CONTRAST:  181m OMNIPAQUE IOHEXOL 350 MG/ML SOLN COMPARISON:  CTA head and neck today reported separately. Portable chest 01/03/2022. CT Abdomen and Pelvis 05/17/2021. FINDINGS: Cardiovascular: Adequate contrast bolus timing in the pulmonary arterial tree. No pulmonary artery filling defect. Calcified coronary artery atherosclerosis. Comparatively mild Calcified aortic atherosclerosis. Cardiac size at the upper limits of normal. No pericardial effusion. No thoracic aortic dissection or aneurysm. Mediastinum/Nodes: No mediastinal mass or lymphadenopathy. Lungs/Pleura: Major airways are patent. There is asymmetric anterior right lung subpleural scarring, such as can be seen with chest radiation. Mild bilateral lung base atelectasis. No pleural effusion. No suspicious pulmonary nodule or convincing pulmonary inflammation. Upper Abdomen: Negative visible  liver, spleen, pancreas, adrenal glands, left kidney, and bowel in the upper abdomen. Musculoskeletal: Mild chronic  appearing T1 superior endplate compression. Very severe degenerative osseous changes at both shoulders. No acute or suspicious osseous lesion identified. Other findings: Surgical clips in the right breast, and inferomedial to that there is an oval mixed density fairly circumscribed 19 mm nodule or mass on series 6, image 361. Review of the MIP images confirms the above findings. IMPRESSION: 1. No evidence of pulmonary embolus or other acute finding in the Chest. 2. A 19 mm mixed density nodule or mass in the right breast is inferio-medial to surgical clips. Recommend follow-up Outpatient Mammography correlation. 3. Calcified coronary artery, mild Aortic Atherosclerosis (ICD10-I70.0). Electronically Signed   By: Genevie Ann M.D.   On: 04/25/2022 11:51   CT Angio Head Neck W WO CM  Result Date: 04/25/2022 CLINICAL DATA:  89 year old female with new onset headache, hypertension, vomiting. EXAM: CT ANGIOGRAPHY HEAD AND NECK TECHNIQUE: Multidetector CT imaging of the head and neck was performed using the standard protocol during bolus administration of intravenous contrast. Multiplanar CT image reconstructions and MIPs were obtained to evaluate the vascular anatomy. Carotid stenosis measurements (when applicable) are obtained utilizing NASCET criteria, using the distal internal carotid diameter as the denominator. RADIATION DOSE REDUCTION: This exam was performed according to the departmental dose-optimization program which includes automated exposure control, adjustment of the mA and/or kV according to patient size and/or use of iterative reconstruction technique. CONTRAST:  13m OMNIPAQUE IOHEXOL 350 MG/ML SOLN COMPARISON:  Head CT 0919 hours today. CTA chest today reported separately. Prior CT face 01/03/2022. previous brain MRI 01/04/2022. 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 Other advanced cervical spine degeneration, including facet arthropathy and developing facet ankylosis. No acute osseous abnormality identified. Upper chest: Negative lung apices and visible superior mediastinum. Other neck: No acute finding. Aortic arch: 3 vessel arch not entirely included. No significant arch atherosclerosis. Right carotid system: Visible brachiocephalic artery and right CCA origin are normal. Mild calcified plaque at the right carotid bifurcation. Mildly tortuous right ICA with no significant stenosis. Left carotid system: Normal left CCA origin. Tortuous cervical left carotid with minimal atherosclerosis and no stenosis. Vertebral arteries: Soft and calcified proximal right subclavian artery plaque with less than 50% stenosis. Normal right vertebral artery origin. Dominant right vertebral artery is patent to the skull base with no plaque or stenosis. Normal left subclavian artery origin. Calcified proximal left subclavian artery plaque without significant stenosis. Left vertebral artery origin is normal. But the left vertebral artery enhancement fades completely in the V1 segment, with no V2 segment enhancement. No V3 segment enhancement. See intracranial findings below. CTA HEAD Posterior circulation: Nonenhancing proximal left vertebral V3 segment. The vertebrobasilar junction is enhancing, with faint retrograde contrast on the left. Patent right V4 segment with mild calcified plaque and no significant stenosis. Patent right AICA. Both distal vertebral artery flow voids were maintained on the brain MRI last year. Patent basilar artery without stenosis. SCA and PCA origins are patent. Basilar tip appears to be patent. Posterior communicating arteries are diminutive or absent. But there is up to moderate bilateral P1 segment irregularity and stenosis greater on the left (series 8, image 22. And there is downstream bilateral P3  severe stenosis and branch 10 UA Shin (on the left series 10, image 31). Anterior circulation: Both ICA siphons are patent. There is minimal left siphon calcified plaque and no stenosis. No significant right siphon plaque or stenosis. Patent carotid termini, MCA and ACA origins. Anterior  communicating artery and proximal ECA branches are patent. Mild to moderate bilateral A2 and up to severe bilateral 83 segment irregularity and stenosis (series 10, image 28). Left MCA M1 segment is patent with mild irregularity. Patent left MCA bifurcation without high-grade stenosis. However, severe posterior left M2 stenosis on series 10, image 37 with distal enhancement. Contralateral right MCA M1 is also patent and mildly irregular. Right MCA bifurcation is patent with moderate stenosis more affecting the anterior division, which remains irregular. And there is widespread right M2, M3 moderate and severe stenoses (series 10, image 21). No discrete right MCA branch occlusion identified. Venous sinuses: Early contrast timing, not evaluated. Anatomic variants: None significant. Review of the MIP images confirms the above findings IMPRESSION: 1. 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 This could be secondary to vertebral artery dissection or atherosclerosis occlusion (see #2). 2. Little atherosclerosis in the neck, but evidence of Advanced Intracranial Atherosclerosis with widespread Moderate And Severe 2nd and 3rd COW branch stenoses. No discrete branch occlusion. Study discussed by telephone with Dr. Nathaniel Man on 04/25/2022 at 11:38. She advises an NIH stroke scale of zero, but unexplained vomiting. We discussed Neurology consultation and that follow-up noncontrast Brain MRI to evaluate for occult brain ischemia might be valuable for treatment planning. Electronically Signed   By: Genevie Ann M.D.   On: 04/25/2022 11:45   CT Head  Wo Contrast  Result Date: 04/25/2022 CLINICAL DATA:  Headache, new onset, high blood pressure, vomiting. EXAM: CT HEAD WITHOUT CONTRAST TECHNIQUE: Contiguous axial images were obtained from the base of the skull through the vertex without intravenous contrast. RADIATION DOSE REDUCTION: This exam was performed according to the departmental dose-optimization program which includes automated exposure control, adjustment of the mA and/or kV according to patient size and/or use of iterative reconstruction technique. COMPARISON:  Head CT dated 01/03/2022 FINDINGS: Brain: Ventricles are stable in size and configuration. Chronic small vessel ischemic changes are seen within the bilateral periventricular and subcortical white matter regions. No mass, hemorrhage, edema or other evidence of acute parenchymal abnormality. No extra-axial hemorrhage. Vascular: Chronic calcified atherosclerotic changes of the large vessels at the skull base. No unexpected hyperdense vessel. Skull: Normal. Negative for fracture or focal lesion. Sinuses/Orbits: No acute finding. Other: None. IMPRESSION: 1. No acute findings. No intracranial mass, hemorrhage or edema. 2. Chronic small vessel ischemic changes in the white matter. Electronically Signed   By: Franki Cabot M.D.   On: 04/25/2022 09:26    Microbiology: Results for orders placed or performed during the hospital encounter of 04/25/22  MRSA Next Gen by PCR, Nasal     Status: None   Collection Time: 04/25/22  5:23 PM   Specimen: Nasal Mucosa; Nasal Swab  Result Value Ref Range Status   MRSA by PCR Next Gen NOT DETECTED NOT DETECTED Final    Comment: (NOTE) The GeneXpert MRSA Assay (FDA approved for NASAL specimens only), is one component of a comprehensive MRSA colonization surveillance program. It is not intended to diagnose MRSA infection nor to guide or monitor treatment for MRSA infections. Test performance is not FDA approved in patients less than 85 years old. Performed  at [facility], [place] [address] [place], [place] [address]   Culture, blood (Routine X 2) w Reflex to ID Panel     Status: None   Collection Time: 04/26/22  8:18 AM   Specimen: BLOOD RIGHT HAND  Result  Value Ref Range Status   Specimen Description BLOOD RIGHT HAND  Final   Special Requests   Final    BOTTLES DRAWN AEROBIC AND ANAEROBIC Blood Culture adequate volume   Culture   Final    NO GROWTH 5 DAYS Performed at [facility], [address] [place], [place] [address]    Report Status 05/01/2022 FINAL  Final  Culture, blood (Routine X 2) w Reflex to ID Panel     Status: None   Collection Time: 04/26/22  8:23 AM   Specimen: BLOOD RIGHT HAND  Result Value Ref Range Status   Specimen Description BLOOD RIGHT HAND  Final   Special Requests   Final    BOTTLES DRAWN AEROBIC AND ANAEROBIC Blood Culture adequate volume   Culture   Final    NO GROWTH 5 DAYS Performed at [facility], [place] [address], [place], [place] [address]    Report Status 05/01/2022 FINAL  Final    Labs: CBC: Recent Labs  Lab 05/06/22 0842 05/07/22 1052 05/08/22 0425 05/10/22 0657 05/12/22 0554  WBC 11.4* 10.2 9.6 7.8 8.0  HGB 10.1* 10.3* 9.5* 9.2* 9.3*  HCT 29.3* 30.8* 28.5* 28.8* 28.0*  MCV 90.4 92.2 91.6 93.8 90.9  PLT 327 390 371 426* [id number]*   Basic Metabolic Panel: Recent Labs  Lab 05/06/22 0842 05/07/22 1052 05/08/22 0425 05/10/22 0657 05/12/22 0554  NA 134* 134* 135 139 137  K 3.8 3.5 3.8 3.8 3.9  CL 101 100 103 102 106  CO2 '24 23 23 26 23  '$ GLUCOSE 105* 142* 100* 106* 101*  BUN 7* '13 10 14 11  '$ CREATININE 0.60 0.72 0.54 0.63 0.63  CALCIUM 8.4* 8.5* 8.5* 8.9 8.5*  MG 1.3* 1.9 1.8  --   --    Liver Function Tests: No results for input(s): "AST", "ALT", "ALKPHOS", "BILITOT", "PROT", "ALBUMIN" in the last 168 hours. CBG: Recent Labs  Lab 05/11/22 0624 05/11/22 1146 05/11/22 1600 05/11/22 2153 05/12/22 0618  GLUCAP 90 105* 114* 122* 122*    Discharge time  spent: less than 30 minutes.  Signed: Annita Brod, MD [facility] 05/12/2022

## 2022-05-12 NOTE — TOC Transition Note (Signed)
Transition of Care [facility]) - CM/SW Discharge Note   Patient Details  Name: Sherry [name] MRN: [id number] Date of Birth: 12-12-1930  [facility] CM/SW Contact:  Geralynn Ochs, LCSW Phone Number: 05/12/2022, 2:27 PM   Clinical Narrative:   [facility] that they can accept patient, and bed is available today. CSW notified MD, patient is medically stable. CSW sent discharge information, updated son via phone. PTAR arranged for after 4:00 pm.  Nurse to call report to [phone], Room 116.    Final next level of care: Skilled Nursing Facility Barriers to Discharge: Barriers Resolved   Patient Goals and CMS Choice CMS Medicare.gov Compare Post Acute Care list provided to:: Patient Choice offered to / list presented to : Patient, Adult Children  Discharge Placement                [facility] Patient to be transferred to facility by: [facility] Name of family member notified: [name] Patient and family notified of of transfer: 05/12/22  Discharge Plan and Services Additional resources added to the After Visit Summary for       Post Acute Care Choice: [place]                               Social Determinants of Health (SDOH) Interventions SDOH Screenings   Food Insecurity: No Food Insecurity (05/05/2022)  Housing: [place]  (05/05/2022)  Transportation Needs: No Transportation Needs (05/05/2022)  Utilities: Not At Risk (05/05/2022)  Tobacco Use: Low Risk  (05/06/2022)     Readmission Risk Interventions    01/07/2022    2:50 PM  Readmission Risk Prevention Plan  Post Dischage Appt Complete  Medication Screening Complete  Transportation Screening Complete

## 2022-05-12 NOTE — Progress Notes (Signed)
Speech Language Pathology Treatment: Cognitive-Linquistic  Patient Details Name: Sherry [name] MRN: [id number] DOB: 18-Jan-1931 Today's Date: 05/12/2022 Time: [id number] SLP Time Calculation (min) (ACUTE ONLY): 20 min  Assessment / Plan / Recommendation Clinical Impression  Patient seen by SLP for skilled treatment focused on cognitive function goals. Patient was awake, alert but telling SLP she was tired. She was receptive to working on cognitive task. SLP presented medication error correction simulation task and explained to patient and demonstrating first example. Patient required initially min-mod frequency of cues but this decrease to minimal towards end as she demonstrated task learning. SLP asked patient if she knew what medications she was currently taking and she said she did not. Although initially telling SLP that prior to this hospitalization, she and husband would manage their medications together, she then stated that she did not previously take any medications. SLP wrote down list of patient's current medications and reviewed with her. She was aware that Lipitor was for cholesterol but benefited from SLP describing purpose of the other medications in the list. SLP then asked patient about her perception of her progress and she reported she felt she was not progressing quickly enough and wants to be able to move. She reported concern that "one day, therapists come, next time they won't". SLP discussed this with PT, who reported that patient has been seen daily for therapy. SLP will continue to follow patient and continues to recommend skilled SLP intervention at next venue of care.    HPI HPI: Pt is an 89 yo female presenting to ED with reports of nausea, vomiting, and headache. MRI Brain showed an acute infarct in the medial aspect of the L cerebellar hemisphere. PMH includes HTN, SDH, multiple falls, and squamous cell carcinoma.      SLP Plan  Continue with current plan of care       Recommendations for follow up therapy are one component of a multi-disciplinary discharge planning process, led by the attending physician.  Recommendations may be updated based on patient status, additional functional criteria and insurance authorization.    Recommendations                   Oral Care Recommendations: Oral care BID Follow Up Recommendations: Skilled nursing-short term rehab (<3 hours/day) Assistance recommended at discharge: Frequent or constant Supervision/Assistance SLP Visit Diagnosis: Cognitive communication deficit [id number]) Plan: Continue with current plan of care           Sonia Baller, MA, CCC-SLP Speech Therapy

## 2022-05-12 NOTE — Plan of Care (Signed)
  Problem: Education: Goal: Knowledge of disease or condition will improve Outcome: Progressing   Problem: Nutrition: Goal: Risk of aspiration will decrease Outcome: Progressing Goal: Dietary intake will improve Outcome: Progressing   

## 2022-05-12 NOTE — Progress Notes (Signed)
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 PMH includes: uncontrolled HTN, and recent fall with concussion.    PT Comments    Pt with increased fatigue, but agreeable to participate in therapy. Performed supine/seated exercises and transferring to chair with two person moderate assist and RW. Continues with weakness (particularly L side), ataxia, impaired balance, and decreased endurance. Continue to recommend SNF for ongoing Physical Therapy.    Orthostatic Vitals: Supine: 141/58 Sitting: 137/51 Standing: 137/46    Recommendations for follow up therapy are one component of a multi-disciplinary discharge planning process, led by the attending physician.  Recommendations may be updated based on patient status, additional functional criteria and insurance authorization.  Follow Up Recommendations  Skilled nursing-short term rehab (<3 hours/day) Can patient physically be transported by private vehicle: No   Assistance Recommended at Discharge Frequent or constant Supervision/Assistance  Patient can return home with the following Two people to help with walking and/or transfers;Two people to help with bathing/dressing/bathroom;Assistance with cooking/housework;Direct supervision/assist for medications management;Assistance with feeding;Direct supervision/assist for financial management;Assist for transportation;Help with stairs or ramp for entrance   Equipment Recommendations  Other (comment) (defer)    Recommendations for Other Services       Precautions / Restrictions Precautions Precautions: Fall;Other (comment) Precaution Comments:  dizziness, watch BP Restrictions Weight Bearing Restrictions: No     Mobility  Bed Mobility Overal bed mobility: Needs Assistance Bed Mobility: Supine to Sit   Sidelying to sit: Min assist       General bed mobility comments: Pt initiating bringing BLE's off edge of bed, hand over hand guidance for use of rail, assist for trunk to upright    Transfers Overall transfer level: Needs assistance Equipment used: Rolling walker (2 wheels) Transfers: Bed to chair/wheelchair/BSC, Sit to/from Stand Sit to Stand: Mod assist, +2 physical assistance, +2 safety/equipment, Max assist Stand pivot transfers: Mod assist, +2 physical assistance         General transfer comment: ModA + 2 to boost up from edge of bed, pivoting to chair towards left. Ataxic movement with LLE with erratic step length/placement. Cues for stepping sequencing and direction. Pt requiring maxA + 2 to rise from chair    Ambulation/Gait                   Stairs             Wheelchair Mobility    Modified Rankin (Stroke Patients Only) Modified Rankin (Stroke Patients Only) Pre-Morbid Rankin Score: No symptoms Modified Rankin: Severe disability     Balance Overall balance assessment: Needs assistance Sitting-balance support: No upper extremity supported, Feet supported Sitting balance-Leahy Scale: Fair     Standing balance support: Bilateral upper extremity supported, During functional activity Standing balance-Leahy Scale: Poor Standing balance comment: relies on external support                            Cognition Arousal/Alertness: Awake/alert Behavior During Therapy: Flat affect Overall Cognitive Status: Impaired/Different from baseline Area of Impairment: Attention, Memory, Following commands, Safety/judgement, Awareness, Problem solving  Current Attention Level: Sustained Memory: Decreased recall of precautions, Decreased short-term  memory Following Commands: Follows one step commands consistently, Follows one step commands with increased time, Follows multi-step commands inconsistently Safety/Judgement: Decreased awareness of safety, Decreased awareness of deficits Awareness: Emergent Problem Solving: Slow processing, Decreased initiation, Difficulty sequencing, Requires verbal cues, Requires tactile cues General Comments: More fatigued and flat this session. Increased time for command following        Exercises General Exercises - Lower Extremity Ankle Circles/Pumps: Both, 10 reps, Supine Long Arc Quad: Both, 10 reps, Seated Heel Slides: Both, 10 reps, Supine    General Comments        Pertinent Vitals/Pain Pain Assessment Pain Assessment: Faces Faces Pain Scale: No hurt    Home Living                          Prior Function            PT Goals (current goals can now be found in the care plan section) Acute Rehab PT Goals Potential to Achieve Goals: Fair    Frequency    Min 3X/week      PT Plan Current plan remains appropriate    Co-evaluation              AM-PAC PT "6 Clicks" Mobility   Outcome Measure  Help needed turning from your back to your side while in a flat bed without using bedrails?: A Little Help needed moving from lying on your back to sitting on the side of a flat bed without using bedrails?: A Little Help needed moving to and from a bed to a chair (including a wheelchair)?: Total Help needed standing up from a chair using your arms (e.g., wheelchair or bedside chair)?: Total Help needed to walk in hospital room?: Total Help needed climbing 3-5 steps with a railing? : Total 6 Click Score: 10    End of Session Equipment Utilized During Treatment: Gait belt Activity Tolerance: Patient limited by fatigue Patient left: in chair;with call bell/phone within reach;with chair alarm set Nurse Communication: Mobility status PT Visit Diagnosis: Other  abnormalities of gait and mobility (R26.89);Muscle weakness (generalized) (M62.81);History of falling (Z91.81);Difficulty in walking, not elsewhere classified (R26.2);Unsteadiness on feet (R26.81);Dizziness and giddiness (R42)     Time: [id number] PT Time Calculation (min) (ACUTE ONLY): 22 min  Charges:  $Therapeutic Activity: 8-22 mins                     [name], PT, DPT Acute Rehabilitation Services Office [phone]    Deno Etienne 05/12/2022, 3:06 PM

## 2022-05-12 NOTE — Telephone Encounter (Signed)
Patient has a foley and is on flomax, f/u with urology in 1 week. Medications updated for accepting facility.Given recent stroke will have PRN hydralazine for elevated blood pressures. BP q8 hours.

## 2022-05-14 ENCOUNTER — Non-Acute Institutional Stay (SKILLED_NURSING_FACILITY): Payer: Medicare Other | Admitting: Student

## 2022-05-14 ENCOUNTER — Encounter: Payer: Self-pay | Admitting: Student

## 2022-05-14 DIAGNOSIS — I639 Cerebral infarction, unspecified: Secondary | ICD-10-CM

## 2022-05-14 DIAGNOSIS — K922 Gastrointestinal hemorrhage, unspecified: Secondary | ICD-10-CM | POA: Diagnosis not present

## 2022-05-14 DIAGNOSIS — R112 Nausea with vomiting, unspecified: Secondary | ICD-10-CM

## 2022-05-14 DIAGNOSIS — E785 Hyperlipidemia, unspecified: Secondary | ICD-10-CM | POA: Diagnosis not present

## 2022-05-14 NOTE — Progress Notes (Signed)
Provider:  Dr. Earnestine Mealing Location:  Other [place].  Nursing Home Room Number: [facility] 116A Place of Service:  SNF (31)  PCP: Danella Penton, MD Patient Care Team: Danella Penton, MD as PCP - General (Internal Medicine) Mariah Milling Tollie Pizza, MD as Consulting Physician (Cardiology)  Extended Emergency Contact Information Primary Emergency Contact: Devincent,james Sr. Home Phone: [phone] Mobile Phone: [phone] Relation: Spouse Secondary Emergency Contact: Asa Lente Home Phone: [phone] Mobile Phone: [phone] Relation: Son  Code Status: Full Code Goals of Care: Advanced Directive information    05/14/2022   11:22 AM  Advanced Directives  Does Patient Have a Medical Advance Directive? No  Does patient want to make changes to medical advance directive? No - Patient declined      Chief Complaint  Patient presents with   New Admit To SNF    New Admission.     HPI: Patient is a 89 y.o. female seen today for admission to recent admission for cerebellar stroke and now admitting to [facility] for [employer].   On the day of her stroke she went to the hospital on a Thursday via EMS because her husband called 911.   She was having a lot of pain at that time in her head. She had vomiting as well. She had an EGD performed inpatient. They found ulcers in her stomach which were bleeding. She still has a urinary tube placed in the hospital due to retention and is scheduled for follow up with urology.  She was evaluated by neurology and GI. During the hospitalization she had an EGD and blood transfusion as well as an echocardiogram that showed.   She lived at home with her husband. She uses a walker to get around. She has had falls in the last year. She was in physical therapy after falls. At home she cooks and cleans when she wants to. She has some cleaning assistance in the home. She drives. 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 Sister in  [place]. Son in [place].   She doesn't have much of an appetitie. She has had nausea. She is taking zofran -- it was helping while she was in the hospital. Once she got here she got nauseated again. She has not been having good bowel movements since leaving [facility]. She had an enema before leaving there.   She has dizziness where the room is spinning  - it started around the same time as the stroke occurred.   Past Medical History:  Diagnosis Date   Actinic keratosis    Breast cancer (HCC)    C1 cervical fracture (HCC)    Hypertension    Osteoarthritis    Past Surgical History:  Procedure Laterality Date   BACK SURGERY     BIOPSY  05/03/2022   Procedure: BIOPSY;  Surgeon: Benancio Deeds, MD;  Location: [facility] ENDOSCOPY;  Service: Gastroenterology;;   ESOPHAGOGASTRODUODENOSCOPY (EGD) WITH PROPOFOL N/A 05/03/2022   Procedure: ESOPHAGOGASTRODUODENOSCOPY (EGD) WITH PROPOFOL;  Surgeon: Benancio Deeds, MD;  Location: [facility] ENDOSCOPY;  Service: Gastroenterology;  Laterality: N/A;   HEMOSTASIS CONTROL  05/03/2022   Procedure: HEMOSTASIS CONTROL;  Surgeon: Benancio Deeds, MD;  Location: [facility] ENDOSCOPY;  Service: Gastroenterology;;   IR INJECT/THERA/INC NEEDLE/CATH/PLC EPI/LUMB/SAC [facility]  04/29/2021   MOHS SURGERY      reports that she has never smoked. She has never been exposed to tobacco smoke. She has never used smokeless tobacco. She reports current alcohol use. She reports that she does not  use drugs. Social History   Socioeconomic History   Marital status: Married    Spouse name: Not on file   Number of children: Not on file   Years of education: Not on file   Highest education level: Not on file  Occupational History   Not on file  Tobacco Use   Smoking status: Never    Passive exposure: Never   Smokeless tobacco: Never  Vaping Use   Vaping Use: Never used  Substance and Sexual Activity   Alcohol use: Yes    Comment: Occassionally   Drug use: No   Sexual activity: Not on file  Other  Topics Concern   Not on file  Social History Narrative   Not on file   Social Determinants of Health   Financial Resource Strain: Not on file  Food Insecurity: No Food Insecurity (05/05/2022)   Hunger Vital Sign    Worried About Running Out of Food in the Last Year: Never true    Ran Out of Food in the Last Year: Never true  Transportation Needs: No Transportation Needs (05/05/2022)   PRAPARE - Administrator, Civil Service (Medical): No    Lack of Transportation (Non-Medical): No  Physical Activity: Not on file  Stress: Not on file  Social Connections: Not on file  Intimate Partner Violence: Not At Risk (05/05/2022)   Humiliation, Afraid, Rape, and Kick questionnaire    Fear of Current or Ex-Partner: No    Emotionally Abused: No    Physically Abused: No    Sexually Abused: No    Functional Status Survey:    Family History  Problem Relation Age of Onset   Hypertension Father     Health Maintenance  Topic Date Due   Medicare Annual Wellness (AWV)  Never done   DEXA SCAN  Never done   Pneumonia Vaccine 42+ Years old (2 of 2 - PPSV23 or PCV20) 01/05/2018   COVID-19 Vaccine (4 - 2023-24 season) 10/30/2021   DTaP/Tdap/Td (3 - Td or Tdap) 07/15/2029   INFLUENZA VACCINE  Completed   Zoster Vaccines- Shingrix  Completed   HPV VACCINES  Aged Out    Allergies  Allergen Reactions   Morphine     Other reaction(s): Other (See Comments) Apnea with large dose Respiratory Distress    Buprenorphine Hcl     Other reaction(s): Other (See Comments), Other (See Comments) Respiratory Distress Respiratory Distress    Codeine    Epinephrine     Other reaction(s): Other (See Comments) Severe anxiety; "makes my heart race" - would like to avoid during surgery   Morphine And Related Other (See Comments)    Respiratory Distress    Percocet [Oxycodone-Acetaminophen]     Outpatient Encounter Medications as of 05/14/2022  Medication Sig   acetaminophen (TYLENOL) 325 MG  tablet Take 2 tablets (650 mg total) by mouth every 6 (six) hours as needed for headache.   aspirin EC 81 MG tablet Take 1 tablet (81 mg total) by mouth daily. Swallow whole.   atorvastatin (LIPITOR) 40 MG tablet Take 1 tablet (40 mg total) by mouth daily.   cholecalciferol (VITAMIN D) 25 MCG (1000 UNIT) tablet Take 2,000 Units by mouth daily.   Cyanocobalamin (B-12) 1000 MCG CAPS Take 2,000 mcg by mouth daily.   hydrALAZINE (APRESOLINE) 50 MG tablet Take 50 mg by mouth every 8 (eight) hours as needed.   lactose free nutrition (BOOST) LIQD Take 237 mLs by mouth 3 (three) times daily between meals.  ondansetron (ZOFRAN) 4 MG tablet Take one tablet by mouth every 6 hours as needed. Give one tablet by mouth every 8 hours for 2 days. Give one tablet by mouth twice daily for 2 days.   pantoprazole (PROTONIX) 40 MG tablet Take 1 tablet (40 mg total) by mouth 2 (two) times daily for 21 days, THEN 1 tablet (40 mg total) daily.   senna-docusate (SENOKOT-S) 8.6-50 MG tablet Take 1 tablet by mouth 2 (two) times daily.   tamsulosin (FLOMAX) 0.4 MG CAPS capsule Take 1 capsule (0.4 mg total) by mouth daily.   [DISCONTINUED] ondansetron (ZOFRAN) 4 MG tablet Take every 8 hours x 2 days, then decrease to twice daily x 2 days, then daily x 2 days, then change every 6 hours as needed (Patient taking differently: Take one tablet by mouth every 8 hours.)   No facility-administered encounter medications on file as of 05/14/2022.    Review of Systems  Gastrointestinal:  Positive for nausea.  Neurological:  Positive for dizziness.    Vitals:   05/14/22 1113  BP: (!) 160/69  Pulse: 82  Resp: 18  Temp: 97.9 F (36.6 C)  SpO2: 98%  Weight: 125 lb (56.7 kg)  Height: 5\' 2"  (1.575 m)   Body mass index is 22.86 kg/m. Physical Exam Constitutional:      Comments: Patient sitting in Dark Room.   Cardiovascular:     Rate and Rhythm: Normal rate.  Pulmonary:     Effort: Pulmonary effort is normal.     Breath  sounds: Normal breath sounds.  Abdominal:     General: Abdomen is flat.     Palpations: Abdomen is soft.  Genitourinary:    Comments: Foley in place with clear/yellow urine Skin:    General: Skin is warm and dry.  Neurological:     General: No focal deficit present.     Mental Status: She is alert and oriented to person, place, and time.     Labs reviewed: Basic Metabolic Panel: Recent Labs    04/26/22 0320 05/01/22 1251 05/06/22 0842 05/07/22 1052 05/08/22 0425 05/10/22 0657 05/12/22 0554  NA 133*   < > 134* 134* 135 139 137  K 3.5   < > 3.8 3.5 3.8 3.8 3.9  CL 97*   < > 101 100 103 102 106  CO2 22   < > 24 23 23 26 23   GLUCOSE 131*   < > 105* 142* 100* 106* 101*  BUN 19   < > 7* 13 10 14 11   CREATININE 0.64   < > 0.60 0.72 0.54 0.63 0.63  CALCIUM 8.9   < > 8.4* 8.5* 8.5* 8.9 8.5*  MG 1.4*  --  1.3* 1.9 1.8  --   --   PHOS 3.0  --   --   --   --   --   --    < > = values in this interval not displayed.   Liver Function Tests: Recent Labs    01/04/22 0440 01/08/22 0327 04/25/22 0948  AST 18 15 26   ALT 11 14 23   ALKPHOS 52 52 43  BILITOT 1.1 0.4 0.9  PROT 5.7* 5.2* 7.0  ALBUMIN 3.4* 2.7* 4.1   Recent Labs    05/21/21 1551 05/23/21 0456 04/25/22 0948  LIPASE 58* 58* 43   No results for input(s): "AMMONIA" in the last 8760 hours. CBC: Recent Labs    04/25/22 1308 04/26/22 0320 05/01/22 1251 05/02/22 6578 05/04/22 0803 05/05/22 0732 05/08/22  0425 05/10/22 0657 05/12/22 0554  WBC 17.8*   < > 20.4*  21.0*   < > 13.4*   < > 9.6 7.8 8.0  NEUTROABS 7.5  --  13.6*  --  8.9*  --   --   --   --   HGB 14.6   < > 7.8*  8.1*   < > 9.9*   < > 9.5* 9.2* 9.3*  HCT 43.9   < > 24.2*  23.3*   < > 28.5*   < > 28.5* 28.8* 28.0*  MCV 88.3   < > 93.4  90.0   < > 90.2   < > 91.6 93.8 90.9  PLT 418*   < > 344  331   < > 306   < > 371 426* 447*   < > = values in this interval not displayed.   Cardiac Enzymes: No results for input(s): "CKTOTAL", "CKMB",  "CKMBINDEX", "TROPONINI" in the last 8760 hours. BNP: Invalid input(s): "POCBNP" Lab Results  Component Value Date   HGBA1C 5.6 04/26/2022   No results found for: "TSH" No results found for: "VITAMINB12" No results found for: "FOLATE" Lab Results  Component Value Date   IRON 46 05/16/2021   TIBC 319 05/16/2021   FERRITIN 168 05/16/2021    Imaging and Procedures obtained prior to SNF admission: ECHOCARDIOGRAM COMPLETE  Result Date: 04/26/2022    ECHOCARDIOGRAM REPORT   Patient Name:   Donnelly Angelica Date of Exam: 04/26/2022 Medical Rec #:  [id number]         Height:       62.0 in Accession #:    [id number]        Weight:       123.9 lb Date of Birth:  1931/02/02        BSA:          1.560 m Patient Age:    89 years          BP:           169/63 mmHg Patient Gender: F                 HR:           85 bpm. Exam Location:  Inpatient Procedure: 2D Echo, 3D Echo, Cardiac Doppler and Color Doppler Indications:    Stroke  History:        Patient has no prior history of Echocardiogram examinations.                 Abnormal ECG, Signs/Symptoms:Chest Pain and Dyspnea; Risk                 Factors:Hypertension and Dyslipidemia.  Sonographer:    Sheralyn Boatman RDCS Referring Phys: [id number] SRISHTI L BHAGAT IMPRESSIONS  1. There is a mid cavity gradient of related to hyperdynamic LVF and mid cavity obliteration during systole. Left ventricular ejection fraction, by estimation, is 70 to 75%. Left ventricular ejection fraction by 3D volume is 72 %. The left ventricle has hyperdynamic function. The left ventricle has no regional wall motion abnormalities. There is severe left ventricular hypertrophy of the basal segment. Left ventricular diastolic parameters are consistent with Grade I diastolic dysfunction (impaired relaxation).  2. Right ventricular systolic function is normal. The right ventricular size is normal. There is mildly elevated pulmonary artery systolic pressure. The estimated right ventricular  systolic pressure is 37.1 mmHg.  3. The mitral valve is normal in structure. Mild mitral valve regurgitation.  No evidence of mitral stenosis.  4. The aortic valve is tricuspid. Aortic valve regurgitation is trivial. Aortic valve sclerosis/calcification is present, without any evidence of aortic stenosis. Aortic valve area, by VTI measures 3.65 cm. Aortic valve mean gradient measures 7.0 mmHg.  Aortic valve Vmax measures 1.79 m/s.  5. The inferior vena cava is normal in size with greater than 50% respiratory variability, suggesting right atrial pressure of 3 mmHg. Conclusion(s)/Recommendation(s): No intracardiac source of embolism detected on this transthoracic study. Consider a transesophageal echocardiogram to exclude cardiac source of embolism if clinically indicated. FINDINGS  Left Ventricle: There is a mid cavity gradient of related to hyperdynamic LVF and mid cavity obliteration during systole. Left ventricular ejection fraction, by estimation, is 70 to 75%. Left ventricular ejection fraction by 3D volume is 72 %. The left ventricle has hyperdynamic function. The left ventricle has no regional wall motion abnormalities. The left ventricular internal cavity size was normal in size. There is severe left ventricular hypertrophy of the basal segment. Left ventricular diastolic parameters are consistent with Grade I diastolic dysfunction (impaired relaxation). Normal left ventricular filling pressure. Right Ventricle: The right ventricular size is normal. No increase in right ventricular wall thickness. Right ventricular systolic function is normal. There is mildly elevated pulmonary artery systolic pressure. The tricuspid regurgitant velocity is 2.92  m/s, and with an assumed right atrial pressure of 3 mmHg, the estimated right ventricular systolic pressure is 37.1 mmHg. Left Atrium: Left atrial size was normal in size. Right Atrium: Right atrial size was normal in size. Pericardium: There is no evidence of  pericardial effusion. Mitral Valve: The mitral valve is normal in structure. Mild mitral valve regurgitation. No evidence of mitral valve stenosis. MV peak gradient, 7.6 mmHg. The mean mitral valve gradient is 3.0 mmHg. Tricuspid Valve: The tricuspid valve is normal in structure. Tricuspid valve regurgitation is mild . No evidence of tricuspid stenosis. Aortic Valve: The aortic valve is tricuspid. Aortic valve regurgitation is trivial. Aortic valve sclerosis/calcification is present, without any evidence of aortic stenosis. Aortic valve mean gradient measures 7.0 mmHg. Aortic valve peak gradient measures 12.8 mmHg. Aortic valve area, by VTI measures 3.65 cm. Pulmonic Valve: The pulmonic valve was normal in structure. Pulmonic valve regurgitation is not visualized. No evidence of pulmonic stenosis. Aorta: The aortic root is normal in size and structure. Venous: The inferior vena cava is normal in size with greater than 50% respiratory variability, suggesting right atrial pressure of 3 mmHg. IAS/Shunts: The interatrial septum appears to be lipomatous. No atrial level shunt detected by color flow Doppler.  LEFT VENTRICLE PLAX 2D LVIDd:         2.90 cm         Diastology LVIDs:         1.70 cm         LV e' medial:    5.11 cm/s LV PW:         1.10 cm         LV E/e' medial:  17.2 LV IVS:        1.60 cm         LV e' lateral:   9.36 cm/s LVOT diam:     2.30 cm         LV E/e' lateral: 9.4 LV SV:         106 LV SV Index:   68 LVOT Area:     4.15 cm        3D Volume EF  LV 3D EF:    Left                                             ventricul                                             ar                                             ejection                                             fraction                                             by 3D                                             volume is                                             72 %.                                 3D Volume EF:                                 3D EF:        72 %                                LV EDV:       87 ml                                LV ESV:       24 ml                                LV SV:        62 ml RIGHT VENTRICLE             IVC RV S prime:     11.50 cm/s  IVC diam: 1.50 cm TAPSE (M-mode): 1.5 cm LEFT ATRIUM           Index        RIGHT ATRIUM          Index LA diam:      2.60  cm 1.67 cm/m   RA Area:     7.08 cm LA Vol (A2C): 41.7 ml 26.74 ml/m  RA Volume:   11.30 ml 7.25 ml/m LA Vol (A4C): 13.4 ml 8.59 ml/m  AORTIC VALVE AV Area (Vmax):    3.41 cm AV Area (Vmean):   3.39 cm AV Area (VTI):     3.65 cm AV Vmax:           179.00 cm/s AV Vmean:          117.000 cm/s AV VTI:            0.289 m AV Peak Grad:      12.8 mmHg AV Mean Grad:      7.0 mmHg LVOT Vmax:         147.00 cm/s LVOT Vmean:        95.600 cm/s LVOT VTI:          0.254 m LVOT/AV VTI ratio: 0.88  AORTA Ao Root diam: 2.90 cm Ao Asc diam:  3.00 cm MITRAL VALVE                TRICUSPID VALVE MV Area (PHT): 2.48 cm     TR Peak grad:   34.1 mmHg MV Area VTI:   3.05 cm     TR Vmax:        292.00 cm/s MV Peak grad:  7.6 mmHg MV Mean grad:  3.0 mmHg     SHUNTS MV Vmax:       1.38 m/s     Systemic VTI:  0.25 m MV Vmean:      82.1 cm/s    Systemic Diam: 2.30 cm MV Decel Time: 307 msec MV E velocity: 88.10 cm/s MV A velocity: 125.50 cm/s MV E/A ratio:  0.70 Armanda Magic MD Electronically signed by Armanda Magic MD Signature Date/Time: 04/26/2022/12:12:26 PM    Final    DG Chest Port 1 View  Result Date: 04/26/2022 CLINICAL DATA:  CVA. EXAM: PORTABLE CHEST 1 VIEW COMPARISON:  Portable yesterday 2:50 p.m. FINDINGS: The heart size and mediastinal contours are within normal limits. There is aortic atherosclerosis. Both lungs are clear. The visualized skeletal structures are intact. The is advanced degenerative change of the shoulders with chronic rotator cuff arthropathy. Right axillary surgical clips. IMPRESSION: No active disease. Aortic atherosclerosis.  Electronically Signed   By: Almira Bar M.D.   On: 04/26/2022 06:36   CT HEAD WO CONTRAST ( )  Result Date: 04/26/2022 CLINICAL DATA:  89 year old female presenting with headache, vomiting, hypertension. 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 EXAM: CT HEAD WITHOUT CONTRAST TECHNIQUE: Contiguous axial images were obtained from the base of the skull through the vertex without intravenous contrast. RADIATION DOSE REDUCTION: This exam was performed according to the departmental dose-optimization program which includes automated exposure control, adjustment of the mA and/or kV according to patient size and/or use of iterative reconstruction technique. COMPARISON:  Brain MRI, head CT, CTA head and neck yesterday. FINDINGS: Brain: Cytotoxic edema in the left cerebellum PICA territory is now evident by CT corresponding to the MRI appearance. No hemorrhagic transformation. Basilar cisterns remain patent, no posterior fossa mass effect at this time. Supratentorial gray-white differentiation remains stable, no convincing cytotoxic edema elsewhere. Chronic small and medium-sized vessel ischemic changes in both hemispheres. No midline shift, ventriculomegaly, mass effect, evidence of mass lesion, intracranial hemorrhage. Vascular: Calcified atherosclerosis at the skull base. Skull: No acute osseous abnormality identified. Sinuses/Orbits: Stable mild paranasal sinus  opacity, stables mild paranasal sinus bubbly opacity and mucosal thickening. Tympanic cavities and mastoids remain clear. Other: No acute orbit or scalp soft tissue finding. IMPRESSION: 1. Expected CT appearance of Left cerebellum PICA infarct. No hemorrhagic transformation or posterior fossa mass effect at this time. 2. No other acute intracranial abnormality by CT. 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 Electronically Signed   By: Odessa Fleming M.D.   On: 04/26/2022 05:37   DG Chest Port 1 View  Result  Date: 04/25/2022 CLINICAL DATA:  Systemic inflammatory response syndrome. EXAM: PORTABLE CHEST 1 VIEW COMPARISON:  Chest radiographs 01/03/2022 FINDINGS: Cardiac silhouette and mediastinal contours are within normal limits. The lungs are clear. No pleural effusion or pneumothorax. Right axillary surgical clips are again seen.) Right-greater-than-left axillary calcific foci as seen on prior CT and unchanged from 01/03/2022 radiographs, likely calcified lymph nodes as can be seen following prior remote granulomatous infection. IMPRESSION: No active disease. Electronically Signed   By: Neita Garnet M.D.   On: 04/25/2022 15:09   MR BRAIN WO CONTRAST  Result Date: 04/25/2022 CLINICAL DATA:  Stroke suspected EXAM: MRI HEAD WITHOUT CONTRAST TECHNIQUE: Multiplanar, multiecho pulse sequences of the brain and surrounding structures were obtained without intravenous contrast. COMPARISON:  Same day CT Head and CTA head/neck FINDINGS: Brain: Acute infarcts in the medial aspect of the left cerebellar hemisphere. There is an additional subtle region of increased signal on diffusion-weighted imaging in the medial aspect of the right occipital lobe (series 5, image 27), favored to represent an additional site of an acute to subacute infarct. Sequela of moderate chronic microvascular ischemic change. No hemorrhage. No hydrocephalus. Vascular: See same day CTA head and neck angiogram for vascular findings. Redemonstrated absent flow void in the V4 segment of the left vertebral artery. Skull and upper cervical spine: Redemonstrated is a chronic appearing type 2 dens fracture. Sinuses/Orbits: Bilateral lens replacement. Trace right mastoid effusion. Trace mucosal thickening in the posterior ethmoid air cells on the right. Other: None. IMPRESSION: 1. 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 2. Additional region of increased signal on diffusion-weighted imaging in the medial aspect of the right occipital  lobe, favored to represent an additional site of an acute to subacute infarct. 3. Absent flow void in the V4 segment of the left vertebral artery, in keeping with known occlusion seen on same day CTA head/neck Electronically Signed   By: Lorenza Cambridge M.D.   On: 04/25/2022 13:59   CT Angio Chest Aorta W and/or Wo Contrast  Result Date: 04/25/2022 CLINICAL DATA:  89 year old female with new onset headache, hypertension, vomiting. EXAM: CT ANGIOGRAPHY CHEST WITH CONTRAST TECHNIQUE: Multidetector CT imaging of the chest was performed using the standard protocol during bolus administration of intravenous contrast. Multiplanar CT image reconstructions and MIPs were obtained to evaluate the vascular anatomy. RADIATION DOSE REDUCTION: This exam was performed according to the departmental dose-optimization program which includes automated exposure control, adjustment of the mA and/or kV according to patient size and/or use of iterative reconstruction technique. CONTRAST:  OMNIPAQUE IOHEXOL 350 MG/ML SOLN COMPARISON:  CTA head and neck today reported separately. Portable chest 01/03/2022. CT Abdomen and Pelvis 05/17/2021. FINDINGS: Cardiovascular: Adequate contrast bolus timing in the pulmonary arterial tree. No pulmonary artery filling defect. Calcified coronary artery atherosclerosis. Comparatively mild Calcified aortic atherosclerosis. Cardiac size at the upper limits of normal. No pericardial effusion. No thoracic aortic dissection or aneurysm. Mediastinum/Nodes: No mediastinal mass or lymphadenopathy. Lungs/Pleura: Major airways are patent. There  is asymmetric anterior right lung subpleural scarring, such as can be seen with chest radiation. Mild bilateral lung base atelectasis. No pleural effusion. No suspicious pulmonary nodule or convincing pulmonary inflammation. Upper Abdomen: Negative visible liver, spleen, pancreas, adrenal glands, left kidney, and bowel in the upper abdomen. Musculoskeletal: Mild  chronic appearing T1 superior endplate compression. Very severe degenerative osseous changes at both shoulders. No acute or suspicious osseous lesion identified. Other findings: Surgical clips in the right breast, and inferomedial to that there is an oval mixed density fairly circumscribed 19 mm nodule or mass on series 6, image 361. Review of the MIP images confirms the above findings. IMPRESSION: 1. No evidence of pulmonary embolus or other acute finding in the Chest. 2. A 19 mm mixed density nodule or mass in the right breast is inferio-medial to surgical clips. Recommend follow-up Outpatient Mammography correlation. 3. Calcified coronary artery, mild Aortic Atherosclerosis (ICD10-I70.0). Electronically Signed   By: Odessa Fleming M.D.   On: 04/25/2022 11:51   CT Angio Head Neck W WO CM  Result Date: 04/25/2022 CLINICAL DATA:  89 year old female with new onset headache, hypertension, vomiting. EXAM: CT ANGIOGRAPHY HEAD AND NECK TECHNIQUE: Multidetector CT imaging of the head and neck was performed using the standard protocol during bolus administration of intravenous contrast. Multiplanar CT image reconstructions and MIPs were obtained to evaluate the vascular anatomy. Carotid stenosis measurements (when applicable) are obtained utilizing NASCET criteria, using the distal internal carotid diameter as the denominator. RADIATION DOSE REDUCTION: This exam was performed according to the departmental dose-optimization program which includes automated exposure control, adjustment of the mA and/or kV according to patient size and/or use of iterative reconstruction technique. CONTRAST:  OMNIPAQUE IOHEXOL 350 MG/ML SOLN COMPARISON:  Head CT 0919 hours today. CTA chest today reported separately. Prior CT face 01/03/2022. previous brain MRI 01/04/2022. 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 Other advanced cervical spine  degeneration, including facet arthropathy and developing facet ankylosis. No acute osseous abnormality identified. Upper chest: Negative lung apices and visible superior mediastinum. Other neck: No acute finding. Aortic arch: 3 vessel arch not entirely included. No significant arch atherosclerosis. Right carotid system: Visible brachiocephalic artery and right CCA origin are normal. Mild calcified plaque at the right carotid bifurcation. Mildly tortuous right ICA with no significant stenosis. Left carotid system: Normal left CCA origin. Tortuous cervical left carotid with minimal atherosclerosis and no stenosis. Vertebral arteries: Soft and calcified proximal right subclavian artery plaque with less than 50% stenosis. Normal right vertebral artery origin. Dominant right vertebral artery is patent to the skull base with no plaque or stenosis. Normal left subclavian artery origin. Calcified proximal left subclavian artery plaque without significant stenosis. Left vertebral artery origin is normal. But the left vertebral artery enhancement fades completely in the V1 segment, with no V2 segment enhancement. No V3 segment enhancement. See intracranial findings below. CTA HEAD Posterior circulation: Nonenhancing proximal left vertebral V3 segment. The vertebrobasilar junction is enhancing, with faint retrograde contrast on the left. Patent right V4 segment with mild calcified plaque and no significant stenosis. Patent right AICA. Both distal vertebral artery flow voids were maintained on the brain MRI last year. Patent basilar artery without stenosis. SCA and PCA origins are patent. Basilar tip appears to be patent. Posterior communicating arteries are diminutive or absent. But there is up to moderate bilateral P1 segment irregularity and stenosis greater on the left (series 8, image 22. And  there is downstream bilateral P3 severe stenosis and branch 10 UA Shin (on the left series 10, image 31). Anterior circulation: Both  ICA siphons are patent. There is minimal left siphon calcified plaque and no stenosis. No significant right siphon plaque or stenosis. Patent carotid termini, MCA and ACA origins. Anterior communicating artery and proximal ECA branches are patent. Mild to moderate bilateral A2 and up to severe bilateral 83 segment irregularity and stenosis (series 10, image 28). Left MCA M1 segment is patent with mild irregularity. Patent left MCA bifurcation without high-grade stenosis. However, severe posterior left M2 stenosis on series 10, image 37 with distal enhancement. Contralateral right MCA M1 is also patent and mildly irregular. Right MCA bifurcation is patent with moderate stenosis more affecting the anterior division, which remains irregular. And there is widespread right M2, M3 moderate and severe stenoses (series 10, image 21). No discrete right MCA branch occlusion identified. Venous sinuses: Early contrast timing, not evaluated. Anatomic variants: None significant. Review of the MIP images confirms the above findings IMPRESSION: 1. 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 This could be secondary to vertebral artery dissection or atherosclerosis occlusion (see #2). 2. Little atherosclerosis in the neck, but evidence of Advanced Intracranial Atherosclerosis with widespread Moderate And Severe 2nd and 3rd COW branch stenoses. No discrete branch occlusion. Study discussed by telephone with Dr. Corena Herter on 04/25/2022 at 11:38. She advises an NIH stroke scale of zero, but unexplained vomiting. We discussed Neurology consultation and that follow-up noncontrast Brain MRI to evaluate for occult brain ischemia might be valuable for treatment planning. Electronically Signed   By: Odessa Fleming M.D.   On: 04/25/2022 11:45   CT Head Wo Contrast  Result Date: 04/25/2022 CLINICAL DATA:  Headache, new onset, high blood pressure,  vomiting. EXAM: CT HEAD WITHOUT CONTRAST TECHNIQUE: Contiguous axial images were obtained from the base of the skull through the vertex without intravenous contrast. RADIATION DOSE REDUCTION: This exam was performed according to the departmental dose-optimization program which includes automated exposure control, adjustment of the mA and/or kV according to patient size and/or use of iterative reconstruction technique. COMPARISON:  Head CT dated 01/03/2022 FINDINGS: Brain: Ventricles are stable in size and configuration. Chronic small vessel ischemic changes are seen within the bilateral periventricular and subcortical white matter regions. No mass, hemorrhage, edema or other evidence of acute parenchymal abnormality. No extra-axial hemorrhage. Vascular: Chronic calcified atherosclerotic changes of the large vessels at the skull base. No unexpected hyperdense vessel. Skull: Normal. Negative for fracture or focal lesion. Sinuses/Orbits: No acute finding. Other: None. IMPRESSION: 1. No acute findings. No intracranial mass, hemorrhage or edema. 2. Chronic small vessel ischemic changes in the white matter. Electronically Signed   By: Bary Richard M.D.   On: 04/25/2022 09:26    Assessment/Plan Cerebellar stroke (HCC)  Upper GI bleed  Nausea and vomiting, unspecified vomiting type  Hyperlipidemia, unspecified hyperlipidemia type Patient with history of HTN recurrent falls who was admitted for HA n/v/dizziness in the setting of a cerebellar stroke. Hospitalization complicated by Gib s/p transfusion.  Due to GIB unable to start asa and plavix during admission. Found ot have hypertensive emergency however improved with treatment. Now has persistent nausea, but no vomiting at this time. Likely secondary to stroke. Started on Thorazine inpatient however Minmal improvement with transition to zofran which has been on a taper the last few days. Will increase zofran back to initial doses and  add reglan. She states in  the hosptial she received additional medication which helped - reglan and meclizine without benefit. Foley in place due to urinary retention. F/u with urology for retention. Motivated to improve with therapy. CTM symtoms with   Family/ staff Communication: Nursing  Labs/tests ordered: CBC, BMP

## 2022-05-15 ENCOUNTER — Emergency Department: Payer: Medicare Other

## 2022-05-15 ENCOUNTER — Telehealth: Payer: Self-pay | Admitting: Adult Health

## 2022-05-15 ENCOUNTER — Other Ambulatory Visit: Payer: Self-pay

## 2022-05-15 ENCOUNTER — Inpatient Hospital Stay
Admission: EM | Admit: 2022-05-15 | Discharge: 2022-05-21 | DRG: 698 | Disposition: A | Discharge status: Skilled Nursing Facility | Payer: Medicare Other | Source: Skilled Nursing Facility | Attending: Internal Medicine | Admitting: Internal Medicine

## 2022-05-15 DIAGNOSIS — G9389 Other specified disorders of brain: Secondary | ICD-10-CM | POA: Diagnosis present

## 2022-05-15 DIAGNOSIS — R112 Nausea with vomiting, unspecified: Secondary | ICD-10-CM | POA: Diagnosis not present

## 2022-05-15 DIAGNOSIS — Z79899 Other long term (current) drug therapy: Secondary | ICD-10-CM

## 2022-05-15 DIAGNOSIS — D649 Anemia, unspecified: Secondary | ICD-10-CM | POA: Diagnosis present

## 2022-05-15 DIAGNOSIS — K279 Peptic ulcer, site unspecified, unspecified as acute or chronic, without hemorrhage or perforation: Secondary | ICD-10-CM | POA: Diagnosis present

## 2022-05-15 DIAGNOSIS — N390 Urinary tract infection, site not specified: Secondary | ICD-10-CM | POA: Diagnosis present

## 2022-05-15 DIAGNOSIS — I63542 Cerebral infarction due to unspecified occlusion or stenosis of left cerebellar artery: Secondary | ICD-10-CM | POA: Diagnosis present

## 2022-05-15 DIAGNOSIS — E876 Hypokalemia: Secondary | ICD-10-CM | POA: Diagnosis present

## 2022-05-15 DIAGNOSIS — I639 Cerebral infarction, unspecified: Secondary | ICD-10-CM | POA: Diagnosis present

## 2022-05-15 DIAGNOSIS — Z885 Allergy status to narcotic agent status: Secondary | ICD-10-CM

## 2022-05-15 DIAGNOSIS — I69398 Other sequelae of cerebral infarction: Secondary | ICD-10-CM

## 2022-05-15 DIAGNOSIS — I10 Essential (primary) hypertension: Secondary | ICD-10-CM | POA: Diagnosis not present

## 2022-05-15 DIAGNOSIS — Z8249 Family history of ischemic heart disease and other diseases of the circulatory system: Secondary | ICD-10-CM

## 2022-05-15 DIAGNOSIS — T83511A Infection and inflammatory reaction due to indwelling urethral catheter, initial encounter: Principal | ICD-10-CM | POA: Diagnosis present

## 2022-05-15 DIAGNOSIS — E871 Hypo-osmolality and hyponatremia: Secondary | ICD-10-CM | POA: Diagnosis present

## 2022-05-15 DIAGNOSIS — K5900 Constipation, unspecified: Secondary | ICD-10-CM

## 2022-05-15 DIAGNOSIS — R338 Other retention of urine: Secondary | ICD-10-CM | POA: Diagnosis present

## 2022-05-15 DIAGNOSIS — Z66 Do not resuscitate: Secondary | ICD-10-CM | POA: Diagnosis present

## 2022-05-15 DIAGNOSIS — Z1152 Encounter for screening for COVID-19: Secondary | ICD-10-CM

## 2022-05-15 DIAGNOSIS — Y846 Urinary catheterization as the cause of abnormal reaction of the patient, or of later complication, without mention of misadventure at the time of the procedure: Secondary | ICD-10-CM | POA: Diagnosis present

## 2022-05-15 DIAGNOSIS — Z888 Allergy status to other drugs, medicaments and biological substances status: Secondary | ICD-10-CM

## 2022-05-15 DIAGNOSIS — D75839 Thrombocytosis, unspecified: Secondary | ICD-10-CM | POA: Diagnosis present

## 2022-05-15 DIAGNOSIS — B9620 Unspecified Escherichia coli [E. coli] as the cause of diseases classified elsewhere: Secondary | ICD-10-CM | POA: Diagnosis present

## 2022-05-15 DIAGNOSIS — Z515 Encounter for palliative care: Secondary | ICD-10-CM

## 2022-05-15 DIAGNOSIS — Z7982 Long term (current) use of aspirin: Secondary | ICD-10-CM

## 2022-05-15 DIAGNOSIS — Z923 Personal history of irradiation: Secondary | ICD-10-CM

## 2022-05-15 DIAGNOSIS — I161 Hypertensive emergency: Secondary | ICD-10-CM | POA: Diagnosis present

## 2022-05-15 DIAGNOSIS — K219 Gastro-esophageal reflux disease without esophagitis: Secondary | ICD-10-CM | POA: Diagnosis present

## 2022-05-15 DIAGNOSIS — Z853 Personal history of malignant neoplasm of breast: Secondary | ICD-10-CM

## 2022-05-15 DIAGNOSIS — R296 Repeated falls: Secondary | ICD-10-CM | POA: Diagnosis present

## 2022-05-15 DIAGNOSIS — R42 Dizziness and giddiness: Secondary | ICD-10-CM

## 2022-05-15 LAB — COMPREHENSIVE METABOLIC PANEL
ALT: 15 U/L (ref 0–44)
AST: 23 U/L (ref 15–41)
Albumin: 3 g/dL — ABNORMAL LOW (ref 3.5–5.0)
Alkaline Phosphatase: 62 U/L (ref 38–126)
Anion gap: 5 (ref 5–15)
BUN: 18 mg/dL (ref 8–23)
CO2: 22 mmol/L (ref 22–32)
Calcium: 8.9 mg/dL (ref 8.9–10.3)
Chloride: 104 mmol/L (ref 98–111)
Creatinine, Ser: 0.57 mg/dL (ref 0.44–1.00)
GFR, Estimated: >60 mL/min (ref >60)
Glucose, Bld: 120 mg/dL — ABNORMAL HIGH (ref 70–99)
Potassium: 3.6 mmol/L (ref 3.5–5.1)
Sodium: 131 mmol/L — ABNORMAL LOW (ref 135–145)
Total Bilirubin: 0.5 mg/dL (ref 0.3–1.2)
Total Protein: 6.5 g/dL (ref 6.5–8.1)

## 2022-05-15 LAB — CBC WITH DIFFERENTIAL/PLATELET
Abs Immature Granulocytes: 0.06 10*3/uL (ref 0.00–0.07)
Basophils Absolute: 0.1 10*3/uL (ref 0.0–0.1)
Basophils Relative: 1 %
Eosinophils Absolute: 0.1 10*3/uL (ref 0.0–0.5)
Eosinophils Relative: 1 %
HCT: 34.5 % — ABNORMAL LOW (ref 36.0–46.0)
Hemoglobin: 11.1 g/dL — ABNORMAL LOW (ref 12.0–15.0)
Immature Granulocytes: 1 %
Lymphocytes Relative: 26 %
Lymphs Abs: 2.1 10*3/uL (ref 0.7–4.0)
MCH: 29.8 pg (ref 26.0–34.0)
MCHC: 32.2 g/dL (ref 30.0–36.0)
MCV: 92.7 fL (ref 80.0–100.0)
Monocytes Absolute: 0.5 10*3/uL (ref 0.1–1.0)
Monocytes Relative: 7 %
Neutro Abs: 5 10*3/uL (ref 1.7–7.7)
Neutrophils Relative %: 64 %
Platelets: 537 10*3/uL — ABNORMAL HIGH (ref 150–400)
RBC: 3.72 MIL/uL — ABNORMAL LOW (ref 3.87–5.11)
RDW: 13.2 % (ref 11.5–15.5)
WBC: 7.8 10*3/uL (ref 4.0–10.5)
nRBC: 0 % (ref 0.0–0.2)

## 2022-05-15 LAB — URINALYSIS, W/ REFLEX TO CULTURE (INFECTION SUSPECTED)
Bilirubin Urine: NEGATIVE
Glucose, UA: NEGATIVE mg/dL
Hgb urine dipstick: NEGATIVE
Ketones, ur: NEGATIVE mg/dL
Nitrite: POSITIVE — AB
Protein, ur: NEGATIVE mg/dL
Specific Gravity, Urine: 1.018 (ref 1.005–1.030)
Squamous Epithelial / HPF: NONE SEEN /HPF (ref 0–5)
WBC, UA: >50 WBC/hpf (ref 0–5)
pH: 7 (ref 5.0–8.0)

## 2022-05-15 LAB — RESP PANEL BY RT-PCR (RSV, FLU A&B, COVID)  RVPGX2
Influenza A by PCR: NEGATIVE
Influenza B by PCR: NEGATIVE
Resp Syncytial Virus by PCR: NEGATIVE
SARS Coronavirus 2 by RT PCR: NEGATIVE

## 2022-05-15 LAB — TROPONIN I (HIGH SENSITIVITY): Troponin I (High Sensitivity): 5 ng/L (ref <18)

## 2022-05-15 MED ORDER — CHLORPROMAZINE HCL 25 MG PO TABS
25.0000 mg | ORAL_TABLET | Freq: Three times a day (TID) | ORAL | Status: DC
  Filled 2022-05-15: qty 1

## 2022-05-15 MED ORDER — ACETAMINOPHEN 650 MG RE SUPP: 650.0000 mg | Freq: Four times a day (QID) | RECTAL | Status: DC | PRN

## 2022-05-15 MED ORDER — ONDANSETRON HCL 4 MG/2ML IJ SOLN
4.0000 mg | Freq: Once | INTRAMUSCULAR | Status: AC
  Administered 2022-05-15: 4 mg via INTRAVENOUS
  Filled 2022-05-15: qty 2

## 2022-05-15 MED ORDER — IOHEXOL 300 MG/ML  SOLN
100.0000 mL | Freq: Once | INTRAMUSCULAR | Status: AC | PRN
  Administered 2022-05-15: 100 mL via INTRAVENOUS

## 2022-05-15 MED ORDER — POLYETHYLENE GLYCOL 3350 17 G PO PACK: 17.0000 g | PACK | Freq: Every day | ORAL | Status: DC | PRN

## 2022-05-15 MED ORDER — SODIUM CHLORIDE 0.9 % IV BOLUS
1000.0000 mL | Freq: Once | INTRAVENOUS | Status: AC
  Administered 2022-05-15: 1000 mL via INTRAVENOUS

## 2022-05-15 MED ORDER — ACETAMINOPHEN 325 MG PO TABS
650.0000 mg | ORAL_TABLET | Freq: Four times a day (QID) | ORAL | Status: DC | PRN
  Administered 2022-05-16 – 2022-05-20 (×5): 650 mg via ORAL
  Filled 2022-05-15 (×6): qty 2

## 2022-05-15 MED ORDER — AMLODIPINE BESYLATE 5 MG PO TABS
5.0000 mg | ORAL_TABLET | Freq: Every day | ORAL | Status: DC
  Administered 2022-05-15 – 2022-05-21 (×7): 5 mg via ORAL
  Filled 2022-05-15 (×7): qty 1

## 2022-05-15 MED ORDER — ATORVASTATIN CALCIUM 20 MG PO TABS
40.0000 mg | ORAL_TABLET | Freq: Every day | ORAL | Status: DC
  Administered 2022-05-15 – 2022-05-18 (×4): 40 mg via ORAL
  Filled 2022-05-15 (×7): qty 2

## 2022-05-15 MED ORDER — SODIUM CHLORIDE 0.9 % IV SOLN
2.0000 g | Freq: Two times a day (BID) | INTRAVENOUS | Status: DC
  Administered 2022-05-15: 2 g via INTRAVENOUS
  Filled 2022-05-15: qty 2
  Filled 2022-05-15: qty 12.5

## 2022-05-15 MED ORDER — CHLORPROMAZINE HCL 25 MG PO TABS
25.0000 mg | ORAL_TABLET | Freq: Two times a day (BID) | ORAL | Status: DC
  Administered 2022-05-15 – 2022-05-21 (×11): 25 mg via ORAL
  Filled 2022-05-15 (×12): qty 1

## 2022-05-15 MED ORDER — ONDANSETRON HCL 4 MG PO TABS: 4.0000 mg | ORAL_TABLET | Freq: Four times a day (QID) | ORAL | Status: DC | PRN

## 2022-05-15 MED ORDER — ONDANSETRON HCL 4 MG/2ML IJ SOLN: 4.0000 mg | Freq: Four times a day (QID) | INTRAMUSCULAR | Status: DC | PRN

## 2022-05-15 MED ORDER — ACETAMINOPHEN 500 MG PO TABS
1000.0000 mg | ORAL_TABLET | Freq: Once | ORAL | Status: AC
  Administered 2022-05-15: 1000 mg via ORAL
  Filled 2022-05-15: qty 2

## 2022-05-15 MED ORDER — SODIUM CHLORIDE 0.9% FLUSH
3.0000 mL | Freq: Two times a day (BID) | INTRAVENOUS | Status: DC
  Administered 2022-05-15 – 2022-05-20 (×11): 3 mL via INTRAVENOUS

## 2022-05-15 MED ORDER — POLYETHYLENE GLYCOL 3350 17 G PO PACK
17.0000 g | PACK | Freq: Every day | ORAL | Status: DC
  Administered 2022-05-15 – 2022-05-21 (×6): 17 g via ORAL
  Filled 2022-05-15 (×7): qty 1

## 2022-05-15 MED ORDER — PANTOPRAZOLE SODIUM 40 MG PO TBEC
40.0000 mg | DELAYED_RELEASE_TABLET | Freq: Two times a day (BID) | ORAL | Status: DC
  Administered 2022-05-15 – 2022-05-21 (×12): 40 mg via ORAL
  Filled 2022-05-15 (×12): qty 1

## 2022-05-15 MED ORDER — ASPIRIN 81 MG PO TBEC
81.0000 mg | DELAYED_RELEASE_TABLET | Freq: Every day | ORAL | Status: DC
  Administered 2022-05-15 – 2022-05-21 (×7): 81 mg via ORAL
  Filled 2022-05-15 (×7): qty 1

## 2022-05-15 MED ORDER — PANTOPRAZOLE SODIUM 40 MG IV SOLR
40.0000 mg | Freq: Once | INTRAVENOUS | Status: AC
  Administered 2022-05-15: 40 mg via INTRAVENOUS
  Filled 2022-05-15: qty 10

## 2022-05-15 MED ORDER — SODIUM CHLORIDE 0.9 % IV SOLN
1.0000 g | Freq: Once | INTRAVENOUS | Status: AC
  Administered 2022-05-15: 1 g via INTRAVENOUS
  Filled 2022-05-15: qty 10

## 2022-05-15 MED ORDER — SENNOSIDES-DOCUSATE SODIUM 8.6-50 MG PO TABS
1.0000 | ORAL_TABLET | Freq: Two times a day (BID) | ORAL | Status: DC
  Administered 2022-05-15 – 2022-05-21 (×9): 1 via ORAL
  Filled 2022-05-15 (×11): qty 1

## 2022-05-15 MED ORDER — BOOST / RESOURCE BREEZE PO LIQD CUSTOM
1.0000 | Freq: Three times a day (TID) | ORAL | Status: DC
  Administered 2022-05-15 – 2022-05-17 (×7): 1 via ORAL
  Administered 2022-05-18: 330 mL via ORAL
  Administered 2022-05-18 – 2022-05-21 (×6): 1 via ORAL

## 2022-05-15 NOTE — Assessment & Plan Note (Signed)
No acute intracranial abnormality seen on CT head today  - Continue outpatient follow-up with neurology - Continue home aspirin and statin

## 2022-05-15 NOTE — Assessment & Plan Note (Addendum)
Patient notes that her symptoms of vertigo including headache and dizziness have worsened since leaving the hospital.  She worries that it may be due to medication changes that were made at [facility].  Pharmacy is working on obtaining medication list from [facility], but per recent admission encounter, she was receiving Thorazine 3 times daily prior to discharge.  Given her UTC today is 480, will restart at a decreased frequency  - Thorazine 25 mg twice daily

## 2022-05-15 NOTE — Assessment & Plan Note (Addendum)
Patient endorses worsening nausea and vomiting over the past 24 hours.  I wonder if may be due to underlying infection, versus part of the vertigo she has been experiencing post CVA.  Given she has been on Thorazine on recent admission with improvement, will restart but at decreased frequency due to QT prolongation  - Thorazine 25 mg twice daily - S/p 1 L fluids

## 2022-05-15 NOTE — ED Notes (Signed)
Pt's spouse to bedside  

## 2022-05-15 NOTE — ED Provider Notes (Signed)
[facility] Provider Note    Event Date/Time   First MD Initiated Contact with Patient 05/15/22 1333     (approximate)   History   Dizziness (Pt. To ED via EMS for dizziness and intermittent HA since stroke 05/12/22. Pt. Was at [facility] with stroke. Pt. States symptoms feel the same since the stroke, have not worsened or changed. Pt. Was hypertensive for EMS.) and Headache   HPI  Sherry [name] is a 89 y.o. female   Past medical history of hypertension, recurrent falls, breast cancer status post lumpectomy and radiation, recent hospitalization and discharged on [id number] with complicated medical course found to have cerebellar stroke, poorly controlled hypertension, upper GI bleed requiring blood transfusion endoscopy showed duodenal/gastric ulcers.    She is in the emergency department from her rehab facility for neurolysed weakness and fatigue as well as ongoing dizziness nausea, headache, high blood pressure since discharge.  She states that she feels that she did not get better since leaving the hospital but denies new trauma, new symptoms.  Per our nursing staff who spoke with EMS, the facility does not think that they are able to care for her given her high needs status post stroke.  She has continued to have nausea, denies vomiting, denies blood in the stools though has had very poor p.o. intake and has had very little stool output.   External Medical Documents Reviewed: Discharge summary from 05/12/2022 for stroke, GI bleeding, hypertension.      Physical Exam   Triage Vital Signs: ED Triage Vitals  Enc Vitals Group     BP 05/15/22 1335 (!) 165/71     Pulse Rate 05/15/22 1335 83     Resp 05/15/22 1335 18     Temp --      Temp Source 05/15/22 1335 Oral     SpO2 05/15/22 1335 97 %     Weight --      Height --      Head Circumference --      Peak Flow --      Pain Score 05/15/22 1336 9     Pain Loc --      Pain Edu? --      Excl. in  [place]? --     Most recent vital signs: Vitals:   05/15/22 1445 05/15/22 1500  BP:  (!) 191/63  Pulse: 73 69  Resp:  16  Temp:    SpO2: 99% 100%    General: Awake, no distress. CV:  Good peripheral perfusion.  Resp:  Normal effort.  Abd:  No distention.  Other:  She is awake alert oriented cooperative and is hypertensive 160/70, other vital signs are within normal limit.  She has dysmetria with finger-to-nose on the left side as well as abnormal heel-to-shin on the left side, but is otherwise motor and sensory intact to all extremities no dysarthria or facial asymmetry has no sign of trauma.  She has some epigastric tenderness to palpation.  Her skin appears warm and well-perfused.   ED Results / Procedures / Treatments   Labs (all labs ordered are listed, but only abnormal results are displayed) Labs Reviewed  COMPREHENSIVE METABOLIC PANEL - Abnormal; Notable for the following components:      Result Value   Sodium 131 (*)    Glucose, Bld 120 (*)    Albumin 3.0 (*)    All other components within normal limits  CBC WITH DIFFERENTIAL/PLATELET - Abnormal; Notable for the following components:  RBC 3.72 (*)    Hemoglobin 11.1 (*)    HCT 34.5 (*)    Platelets 537 (*)    All other components within normal limits  URINALYSIS, W/ REFLEX TO CULTURE (INFECTION SUSPECTED) - Abnormal; Notable for the following components:   Color, Urine YELLOW (*)    APPearance CLOUDY (*)    Nitrite POSITIVE (*)    Leukocytes,Ua LARGE (*)    Bacteria, UA FEW (*)    All other components within normal limits  RESP PANEL BY RT-PCR (RSV, FLU A&B, COVID)  [id number]  URINE CULTURE  TROPONIN I (HIGH SENSITIVITY)  TROPONIN I (HIGH SENSITIVITY)     I ordered and reviewed the above labs they are notable for nitrite positive urine and leukocytes and bacteria, hemoglobin is 11.1 stable from prior  EKG  ED ECG REPORT I, Lucillie Garfinkel, the attending physician, personally viewed and interpreted this ECG.    Date: 05/15/2022  EKG Time: 1344  Rate: 80  Rhythm: sinus  Axis: nl  Intervals:lafb, rbbb  ST&T Change: no acute ischemic changes    RADIOLOGY I independently reviewed and interpreted chest x-ray and see no obvious focality or pneumothorax   PROCEDURES:  Critical Care performed: No  Procedures   MEDICATIONS ORDERED IN ED: Medications  cefTRIAXone (ROCEPHIN) 1 g in sodium chloride 0.9 % 100 mL IVPB (has no administration in time range)  acetaminophen (TYLENOL) tablet 1,000 mg (1,000 mg Oral Given 05/15/22 1446)  pantoprazole (PROTONIX) injection 40 mg (40 mg Intravenous Given 05/15/22 1448)  ondansetron (ZOFRAN) injection 4 mg (4 mg Intravenous Given 05/15/22 1446)  sodium chloride 0.9 % bolus 1,000 mL (1,000 mLs Intravenous New Bag/Given 05/15/22 1444)     IMPRESSION / MDM / [place] / ED COURSE  I reviewed the triage vital signs and the nursing notes.                                Patient's presentation is most consistent with acute presentation with potential threat to life or bodily function.  Differential diagnosis includes, but is not limited to, intracranial bleeding, ongoing symptoms from recent cerebellar stroke, GI bleeding or anemia, electrolyte derangement, intra-abdominal infection, deconditioning   The patient is on the cardiac monitor to evaluate for evidence of arrhythmia and/or significant heart rate changes.  MDM: This patient with ongoing epigastric pain and cerebellar symptoms with recent cerebellar stroke and GI bleeding hospitalization we will check CT scan of the head to rule out intracranial bleeding, CT scan of the abdomen pelvis to rule out perforation or abdominal infection, check basic labs to assess for anemia in the setting of recent GI bleed.  And overall weakness and deconditioning recent hospitalization will do an infectious workup as well with urinalysis and chest x-ray.   Patient with evidence of urinary tract infection on lab  testing.  Plan for admission.  IV Rocephin ordered.  I reviewed CT of the head and see no obvious bleeding or midline shift, formal read is pending.  CT abdomen pelvis is pending though tenderness to the epigastrium most consistent with her known duodenal ulcers/gastric ulcers.     FINAL CLINICAL IMPRESSION(S) / ED DIAGNOSES   Final diagnoses:  Hypertension, unspecified type  Urinary tract infection without hematuria, site unspecified     Rx / DC Orders   ED Discharge Orders     None        Note:  This document was  prepared using Systems analyst and may include unintentional dictation errors.    Lucillie Garfinkel, MD 05/15/22 [id number]

## 2022-05-15 NOTE — ED Notes (Signed)
ED Provider at bedside. 

## 2022-05-15 NOTE — ED Triage Notes (Signed)
Pt. To ED via EMS for dizziness and intermittent HA since stroke 05/12/22. Pt. Was at [facility] with stroke. Pt. States symptoms feel the same since the stroke, have not worsened or changed. Pt. Was hypertensive for EMS.

## 2022-05-15 NOTE — H&P (Signed)
History and Physical    Patient: Sherry [name] [id number] DOB: [birth year] DOA: 05/15/2022 DOS: the patient was seen and examined on 05/15/2022 PCP: Rusty Aus, MD  Patient coming from: SNF  Chief Complaint:  Chief Complaint  Patient presents with   Dizziness    Pt. To ED via EMS for dizziness and intermittent HA since stroke 05/12/22. Pt. Was at [facility] with stroke. Pt. States symptoms feel the same since the stroke, have not worsened or changed. Pt. Was hypertensive for EMS.   Headache   HPI: Sherry [name] is a 89 y.o. 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  Sherry [name] states that she has only been at [facility] for approximately 24 hours before she was sent to the ED due to increased nausea with several episodes of vomiting.  In addition, her previously present dizziness and headache seem to have worsened in severity.  She is uncertain if her emesis was bright red or black as she did not look.  She denies any known fevers or chills, chest pain, shortness of breath or abdominal pain.  She endorses constipation.  Notes that she does not have urinary symptoms given her Foley catheter is still in.  ED course: On arrival to the ED, patient was hypertensive at 166/71 with heart rate of 79.  She was saturating at 100% on room air.  She was afebrile at 97.7.  Initial workup notable for WBC of 7.8, hemoglobin of 11.1, sodium of 131, glucose 120, BUN 18, creatinine 0.57 and GFR above 60.  COVID-19, influenza and RSV PCR negative.  Urinalysis was obtained that demonstrated large leukocytes, positive nitrites, and few bacteria in addition to over 50 WBCs/hpf.  CT of the head was obtained that did not  show any acute abnormalities or changes.  CT of the abdomen was obtained with final reading still pending.  Chest x-ray was obtained with no acute cardiopulmonary disease.  TRH contacted for admission due to complicated UTI.  Review of Systems: As mentioned in the history of present illness. All other systems reviewed and are negative.  Past Medical History:  Diagnosis Date   Actinic keratosis    Breast cancer ([place])    C1 cervical fracture ([place])    Hypertension    Osteoarthritis    Past Surgical History:  Procedure Laterality Date   BACK SURGERY     BIOPSY  05/03/2022   Procedure: BIOPSY;  Surgeon: Yetta Flock, MD;  Location: [facility];  Service: Gastroenterology;;   ESOPHAGOGASTRODUODENOSCOPY (EGD) WITH PROPOFOL N/A 05/03/2022   Procedure: ESOPHAGOGASTRODUODENOSCOPY (EGD) WITH PROPOFOL;  Surgeon: Yetta Flock, MD;  Location: [place];  Service: Gastroenterology;  Laterality: N/A;   HEMOSTASIS CONTROL  05/03/2022   Procedure: HEMOSTASIS CONTROL;  Surgeon: Yetta Flock, MD;  Location: [place];  Service: Gastroenterology;;   IR INJECT/THERA/INC NEEDLE/CATH/PLC EPI/LUMB/SAC [facility]  04/29/2021   MOHS SURGERY     Social History:  reports that she has never smoked. She has never been exposed to tobacco smoke. She has never used smokeless tobacco. She reports current alcohol use. She reports that she does not use drugs.  Allergies  Allergen Reactions   Morphine     Other reaction(s): Other (See Comments) Apnea with large dose Respiratory Distress  Buprenorphine Hcl     Other reaction(s): Other (See Comments), Other (See Comments) Respiratory Distress Respiratory Distress    Codeine    Epinephrine     Other reaction(s): Other (See Comments) Severe anxiety; "makes my heart race" - would like to avoid during surgery   Morphine And Related Other (See Comments)    Respiratory Distress    Percocet [Oxycodone-Acetaminophen]     Family History  Problem  Relation Age of Onset   Hypertension Father     Prior to Admission medications   Medication Sig Start Date End Date Taking? Authorizing Provider  acetaminophen (TYLENOL) 325 MG tablet Take 2 tablets (650 mg total) by mouth every 6 (six) hours as needed for headache. 05/12/22   Annita Brod, MD  aspirin EC 81 MG tablet Take 1 tablet (81 mg total) by mouth daily. Swallow whole. 05/13/22   Annita Brod, MD  atorvastatin (LIPITOR) 40 MG tablet Take 1 tablet (40 mg total) by mouth daily. 05/13/22   Annita Brod, MD  cholecalciferol (VITAMIN D) 25 MCG (1000 UNIT) tablet Take 2,000 Units by mouth daily.    [provider]  Cyanocobalamin (B-12) 1000 MCG CAPS Take 2,000 mcg by mouth daily.    [provider]  hydrALAZINE (APRESOLINE) 50 MG tablet Take 50 mg by mouth every 8 (eight) hours as needed.    [provider]  lactose free nutrition (BOOST) LIQD Take 237 mLs by mouth 3 (three) times daily between meals.    [provider]  ondansetron (ZOFRAN) 4 MG tablet Take one tablet by mouth every 6 hours as needed. Give one tablet by mouth every 8 hours for 2 days. Give one tablet by mouth twice daily for 2 days.    [provider]  pantoprazole (PROTONIX) 40 MG tablet Take 1 tablet (40 mg total) by mouth 2 (two) times daily for 21 days, THEN 1 tablet (40 mg total) daily. 05/12/22 07/02/22  Annita Brod, MD  senna-docusate (SENOKOT-S) 8.6-50 MG tablet Take 1 tablet by mouth 2 (two) times daily. 05/12/22   Annita Brod, MD  tamsulosin (FLOMAX) 0.4 MG CAPS capsule Take 1 capsule (0.4 mg total) by mouth daily. 05/12/22   Annita Brod, MD    Physical Exam: Vitals:   05/15/22 1745 05/15/22 1800 05/15/22 1810 05/15/22 1815  BP:   (!) 163/80   Pulse: 74 72 74 80  Resp: 15 18 19 19   Temp:      TempSrc:      SpO2: 100% 97% 98% 100%   Physical Exam Vitals and nursing note reviewed.  Constitutional:      General: She is not in acute  distress.    Appearance: She is normal weight.  HENT:     Head: Normocephalic and atraumatic.  Eyes:     Extraocular Movements: Extraocular movements intact.     Pupils: Pupils are equal, round, and reactive to light.  Cardiovascular:     Rate and Rhythm: Normal rate and regular rhythm.     Heart sounds: No murmur heard.    No gallop.  Pulmonary:     Effort: Pulmonary effort is normal. No respiratory distress.     Breath sounds: Normal breath sounds. No wheezing or rales.  Abdominal:     General: There is no distension.     Palpations: Abdomen is soft.     Tenderness: There is no abdominal tenderness.  Musculoskeletal:     Right lower leg: No edema.  Left lower leg: No edema.  Skin:    General: Skin is warm and dry.  Neurological:     General: No focal deficit present.     Mental Status: She is alert and oriented to person, place, and time.     Comments:  No facial asymmetry.  5/5 strength of right upper and lower extremity.  4/5 strength of upper extremity  Psychiatric:        Mood and Affect: Mood normal.        Behavior: Behavior normal.    Data Reviewed: CBC with WBC of 7.8, hemoglobin of 11.1, and platelets of 537 CMP with sodium of 131, potassium 3.6, bicarb 22, glucose 120, BUN 18, creatinine 0.57, AST 23, ALT 15 and GFR above 60.   Troponin negative at 5 Urinalysis with large leukocytes, positive nitrites, few bacteria, and over 50 WBC/hpf and 11-20 RBC/hpf. COVID-19, influenza and RSV PCR negative  EKG personally reviewed sinus rhythm with rate of 80.  Right bundle branch block noted.  CT Abdomen Pelvis W Contrast  Result Date: 05/15/2022 CLINICAL DATA:  Abdomen pain EXAM: CT ABDOMEN AND PELVIS WITH CONTRAST TECHNIQUE: Multidetector CT imaging of the abdomen and pelvis was performed using the standard protocol following bolus administration of intravenous contrast. RADIATION DOSE REDUCTION: This exam was performed according to the departmental dose-optimization  program which includes automated exposure control, adjustment of the mA and/or kV according to patient size and/or use of iterative reconstruction technique. CONTRAST:  115mL OMNIPAQUE IOHEXOL 300 MG/ML  SOLN COMPARISON:  CT 05/02/2022, 05/17/2021 FINDINGS: Lower chest: Lung bases demonstrate small left greater than right pleural effusions which are new. Partial consolidations in the lower lobes favored to represent atelectasis. Mild scarring right middle lobe Hepatobiliary: Distended gallbladder with stones. No inflammatory change or biliary dilatation. Small cyst within the central liver. Pancreas: No inflammation. Stable 8 mm cyst at the uncinate process. Spleen: Normal in size without focal abnormality. Adrenals/Urinary Tract: Adrenal glands are normal. Kidneys show no hydronephrosis. The bladder contains Foley catheter and appears slightly thick-walled with mild mucosal enhancement. Stomach/Bowel: Stomach is nonenlarged. Thickening of the pylorus with mucosal enhancement. No dilated small bowel. Large feces in the colon and rectum. Vascular/Lymphatic: Moderate aortic atherosclerosis. No aneurysm. No suspicious lymph nodes. Reproductive: Status post hysterectomy. No adnexal masses. Other: Negative for pelvic effusion.  No free air. Musculoskeletal: No acute or suspicious osseous abnormality. Multilevel degenerative change with grade 1 anterolisthesis L5 on S1. Stable 1.8 cm nodule in the right inferior breast. IMPRESSION: 1. Small left greater than right pleural effusions with partial consolidations in the lower lobes favored to represent atelectasis. 2. Distended gallbladder with stones but no inflammatory change or biliary dilatation. 3. Thickening of the pylorus with mucosal enhancement, suspicious for peptic ulcer disease. No evidence for perforation at this time. 4. Slightly thick-walled appearance of the urinary bladder with mild mucosal enhancement, correlate for cystitis. 5. Large volume of feces in the  colon and rectum suggesting constipation. 6. Stable 1.8 cm right inferior breast nodule 7. Aortic atherosclerosis. Aortic Atherosclerosis (ICD10-I70.0). Electronically Signed   By: Donavan Foil M.D.   On: 05/15/2022 17:15   DG Chest Port 1 View  Result Date: 05/15/2022 CLINICAL DATA:  Weakness EXAM: PORTABLE CHEST 1 VIEW COMPARISON:  Radiograph 05/01/2022 FINDINGS: Unchanged cardiomediastinal silhouette. There is no new focal airspace consolidation. There is no pleural effusion or evidence of pneumothorax. Bilateral shoulder degenerative changes with unchanged osteochondral joint bodies. Thoracic spondylosis. IMPRESSION: No evidence of acute cardiopulmonary disease. Electronically  Signed   By: Maurine Simmering M.D.   On: 05/15/2022 15:20   CT Head Wo Contrast  Result Date: 05/15/2022 CLINICAL DATA:  Concern for acute stroke EXAM: CT HEAD WITHOUT CONTRAST TECHNIQUE: Contiguous axial images were obtained from the base of the skull through the vertex without intravenous contrast. RADIATION DOSE REDUCTION: This exam was performed according to the departmental dose-optimization program which includes automated exposure control, adjustment of the mA and/or kV according to patient size and/or use of iterative reconstruction technique. COMPARISON:  None Available. FINDINGS: Brain: No acute intracranial hemorrhage. No focal mass lesion. No CT evidence of acute infarction. No midline shift or mass effect. No hydrocephalus. Basilar cisterns are patent. There are periventricular and subcortical white matter hypodensities. Generalized cortical atrophy. Vascular: No hyperdense vessel or unexpected calcification. Skull: Normal. Negative for fracture or focal lesion. Sinuses/Orbits: Paranasal sinuses and mastoid air cells are clear. Orbits are clear. Other: None. IMPRESSION: 1. No acute intracranial findings. 2. Atrophy and white matter microvascular disease. Electronically Signed   By: Suzy Bouchard M.D.   On: 05/15/2022  15:16    Results are pending, will review when available.  Assessment and Plan:  * Complicated UTI (urinary tract infection) Patient is presenting with worsening headache and dizziness, in addition to new nausea and vomiting with urinalysis concerning for UTI in the setting of Foley catheter that was exchanged on 05/09/2022.  Foley was initially placed in the setting of urinary retention secondary to CVA. CT of the abdomen demonstrates bladder wall thickening with mucosal and enhancement.  - Foley exchanged - Urine culture pending - Discontinue ceftriaxone - Start Cefepime per pharmacy dosing  Nausea and vomiting Patient endorses worsening nausea and vomiting over the past 24 hours.  I wonder if may be due to underlying infection, versus part of the vertigo she has been experiencing post CVA.  Given she has been on Thorazine on recent admission with improvement, will restart but at decreased frequency due to QT prolongation  - Thorazine 25 mg twice daily - S/p 1 L fluids  Vertigo following cerebrovascular accident Patient notes that her symptoms of vertigo including headache and dizziness have worsened since leaving the hospital.  She worries that it may be due to medication changes that were made at [facility].  Pharmacy is working on obtaining medication list from [facility], but per recent admission encounter, she was receiving Thorazine 3 times daily prior to discharge.  Given her UTC today is 480, will restart at a decreased frequency  - Thorazine 25 mg twice daily  Left-sided cerebrovascular accident (CVA) ([place]) No acute intracranial abnormality seen on CT head today  - Continue outpatient follow-up with neurology - Continue home aspirin and statin  Essential hypertension - Restart amlodipine at decreased dose of 5 mg daily given orthostasis on prior admission  Constipation Significant stool burden seen on CT imaging.  - Soapsuds enema - Start daily Miralax and  Senna  Advance Care Planning:   Code Status: DNR/DNI.  On discussion regarding CODE STATUS with both patient and her husband at bedside, Ms. Feilen states that she would not want heroic measures taken if she cannot return to the same quality of life.  We discussed that CPR is extremely brutal and difficult to recover from.  Upon discussing this, she states she would not want CPR.  After discussing the process of intubation and recovery afterwards, Mrs. Stanforth stated she would not want to be artificially kept alive.  Her husband at bedside agrees  Consults: None  Family Communication: Patient's husband updated at bedside  Severity of Illness: The appropriate patient status for this patient is OBSERVATION. Observation status is judged to be reasonable and necessary in order to provide the required intensity of service to ensure the patient's safety. The patient's presenting symptoms, physical exam findings, and initial radiographic and laboratory data in the context of their medical condition is felt to place them at decreased risk for further clinical deterioration. Furthermore, it is anticipated that the patient will be medically stable for discharge from the hospital within 2 midnights of admission.   Author: Jose Persia, MD 05/15/2022 6:42 PM  For on call review [email or web address].

## 2022-05-15 NOTE — Assessment & Plan Note (Signed)
Significant stool burden seen on CT imaging.  - Soapsuds enema - Start daily Miralax and Senna

## 2022-05-15 NOTE — Assessment & Plan Note (Addendum)
Patient is presenting with worsening headache and dizziness, in addition to new nausea and vomiting with urinalysis concerning for UTI in the setting of Foley catheter that was exchanged on 05/09/2022.  Foley was initially placed in the setting of urinary retention secondary to CVA. CT of the abdomen demonstrates bladder wall thickening with mucosal and enhancement.  - Foley exchanged - Urine culture pending - Discontinue ceftriaxone - Start Cefepime per pharmacy dosing

## 2022-05-15 NOTE — Consult Note (Signed)
Pharmacy Antibiotic Note  Sherry [name] is a 89 y.o. female admitted on 05/15/2022 with UTI.  Pharmacy has been consulted for cefepime dosing.  CrCl 37.7  Plan: Cefepime 2 gm IV Q12H     Temp (24hrs), Avg:97.9 F (36.6 C), Min:97.7 F (36.5 C), Max:98.1 F (36.7 C)  Recent Labs  Lab 05/10/22 0657 05/12/22 0554 05/15/22 1404  WBC 7.8 8.0 7.8  CREATININE 0.63 0.63 0.57    Estimated Creatinine Clearance: 37.7 mL/min (by C-G formula based on SCr of 0.57 mg/dL).    Allergies  Allergen Reactions   Morphine     Other reaction(s): Other (See Comments) Apnea with large dose Respiratory Distress    Buprenorphine Hcl     Other reaction(s): Other (See Comments), Other (See Comments) Respiratory Distress Respiratory Distress    Codeine    Epinephrine     Other reaction(s): Other (See Comments) Severe anxiety; "makes my heart race" - would like to avoid during surgery   Morphine And Related Other (See Comments)    Respiratory Distress    Percocet [Oxycodone-Acetaminophen]     Antimicrobials this admission: 3/16 CRO x 1 dose in ED 3/16 cefepime >>   Dose adjustments this admission: N/A  Microbiology results: 3/16 UCx: pending    Thank you for allowing pharmacy to be a part of this patient's care.  Alison Murray 05/15/2022 7:35 PM

## 2022-05-15 NOTE — Telephone Encounter (Signed)
Nurse called to report pt is having high bp, headache, and nausea not controlled with current medications.  Recently had a stroke. Pt is frustrated and is wanting to go back to the hospital if more can not be done in facility. At this time due to the complexity if her symptoms and recent CVA would recommend ER evaluation.

## 2022-05-15 NOTE — Assessment & Plan Note (Addendum)
-   Restart amlodipine at decreased dose of 5 mg daily given orthostasis on prior admission

## 2022-05-16 ENCOUNTER — Encounter: Payer: Self-pay | Admitting: Internal Medicine

## 2022-05-16 DIAGNOSIS — D75839 Thrombocytosis, unspecified: Secondary | ICD-10-CM | POA: Diagnosis present

## 2022-05-16 DIAGNOSIS — Z8249 Family history of ischemic heart disease and other diseases of the circulatory system: Secondary | ICD-10-CM | POA: Diagnosis not present

## 2022-05-16 DIAGNOSIS — I69398 Other sequelae of cerebral infarction: Secondary | ICD-10-CM | POA: Diagnosis not present

## 2022-05-16 DIAGNOSIS — R296 Repeated falls: Secondary | ICD-10-CM | POA: Diagnosis present

## 2022-05-16 DIAGNOSIS — Z853 Personal history of malignant neoplasm of breast: Secondary | ICD-10-CM | POA: Diagnosis not present

## 2022-05-16 DIAGNOSIS — I639 Cerebral infarction, unspecified: Secondary | ICD-10-CM | POA: Diagnosis not present

## 2022-05-16 DIAGNOSIS — Z515 Encounter for palliative care: Secondary | ICD-10-CM | POA: Diagnosis not present

## 2022-05-16 DIAGNOSIS — T83511A Infection and inflammatory reaction due to indwelling urethral catheter, initial encounter: Secondary | ICD-10-CM | POA: Diagnosis present

## 2022-05-16 DIAGNOSIS — I10 Essential (primary) hypertension: Secondary | ICD-10-CM | POA: Diagnosis present

## 2022-05-16 DIAGNOSIS — Z79899 Other long term (current) drug therapy: Secondary | ICD-10-CM | POA: Diagnosis not present

## 2022-05-16 DIAGNOSIS — G9389 Other specified disorders of brain: Secondary | ICD-10-CM | POA: Diagnosis present

## 2022-05-16 DIAGNOSIS — K5900 Constipation, unspecified: Secondary | ICD-10-CM | POA: Diagnosis present

## 2022-05-16 DIAGNOSIS — K219 Gastro-esophageal reflux disease without esophagitis: Secondary | ICD-10-CM | POA: Diagnosis present

## 2022-05-16 DIAGNOSIS — R338 Other retention of urine: Secondary | ICD-10-CM | POA: Diagnosis present

## 2022-05-16 DIAGNOSIS — N390 Urinary tract infection, site not specified: Secondary | ICD-10-CM | POA: Diagnosis present

## 2022-05-16 DIAGNOSIS — Z1152 Encounter for screening for COVID-19: Secondary | ICD-10-CM | POA: Diagnosis not present

## 2022-05-16 DIAGNOSIS — K279 Peptic ulcer, site unspecified, unspecified as acute or chronic, without hemorrhage or perforation: Secondary | ICD-10-CM | POA: Diagnosis present

## 2022-05-16 DIAGNOSIS — Y846 Urinary catheterization as the cause of abnormal reaction of the patient, or of later complication, without mention of misadventure at the time of the procedure: Secondary | ICD-10-CM | POA: Diagnosis present

## 2022-05-16 DIAGNOSIS — I63542 Cerebral infarction due to unspecified occlusion or stenosis of left cerebellar artery: Secondary | ICD-10-CM | POA: Diagnosis present

## 2022-05-16 DIAGNOSIS — I161 Hypertensive emergency: Secondary | ICD-10-CM | POA: Diagnosis present

## 2022-05-16 DIAGNOSIS — Z66 Do not resuscitate: Secondary | ICD-10-CM | POA: Diagnosis present

## 2022-05-16 DIAGNOSIS — E871 Hypo-osmolality and hyponatremia: Secondary | ICD-10-CM | POA: Diagnosis present

## 2022-05-16 DIAGNOSIS — D649 Anemia, unspecified: Secondary | ICD-10-CM | POA: Diagnosis present

## 2022-05-16 DIAGNOSIS — B9620 Unspecified Escherichia coli [E. coli] as the cause of diseases classified elsewhere: Secondary | ICD-10-CM | POA: Diagnosis present

## 2022-05-16 DIAGNOSIS — E876 Hypokalemia: Secondary | ICD-10-CM | POA: Diagnosis present

## 2022-05-16 DIAGNOSIS — Z7189 Other specified counseling: Secondary | ICD-10-CM | POA: Diagnosis not present

## 2022-05-16 DIAGNOSIS — R112 Nausea with vomiting, unspecified: Secondary | ICD-10-CM | POA: Diagnosis not present

## 2022-05-16 DIAGNOSIS — Z923 Personal history of irradiation: Secondary | ICD-10-CM | POA: Diagnosis not present

## 2022-05-16 LAB — BASIC METABOLIC PANEL
Anion gap: 12 (ref 5–15)
BUN: 15 mg/dL (ref 8–23)
CO2: 24 mmol/L (ref 22–32)
Calcium: 9.2 mg/dL (ref 8.9–10.3)
Chloride: 102 mmol/L (ref 98–111)
Creatinine, Ser: 0.57 mg/dL (ref 0.44–1.00)
GFR, Estimated: >60 mL/min (ref >60)
Glucose, Bld: 86 mg/dL (ref 70–99)
Potassium: 3.8 mmol/L (ref 3.5–5.1)
Sodium: 135 mmol/L (ref 135–145)

## 2022-05-16 LAB — CBC
HCT: 34.1 % — ABNORMAL LOW (ref 36.0–46.0)
Hemoglobin: 11.2 g/dL — ABNORMAL LOW (ref 12.0–15.0)
MCH: 29.9 pg (ref 26.0–34.0)
MCHC: 32.8 g/dL (ref 30.0–36.0)
MCV: 91.2 fL (ref 80.0–100.0)
Platelets: 460 10*3/uL — ABNORMAL HIGH (ref 150–400)
RBC: 3.74 MIL/uL — ABNORMAL LOW (ref 3.87–5.11)
RDW: 13.5 % (ref 11.5–15.5)
WBC: 7 10*3/uL (ref 4.0–10.5)
nRBC: 0 % (ref 0.0–0.2)

## 2022-05-16 MED ORDER — ORAL CARE MOUTH RINSE: 15.0000 mL | OROMUCOSAL | Status: DC | PRN

## 2022-05-16 MED ORDER — SODIUM CHLORIDE 0.9 % IV SOLN: 2.0000 g | INTRAVENOUS | Status: DC

## 2022-05-16 MED ORDER — CHLORHEXIDINE GLUCONATE CLOTH 2 % EX PADS
6.0000 | MEDICATED_PAD | Freq: Every day | CUTANEOUS | Status: DC
  Administered 2022-05-16 – 2022-05-20 (×5): 6 via TOPICAL

## 2022-05-16 NOTE — Progress Notes (Signed)
Progress Note   Patient: Sherry [name] [id number] DOB: Jun 29, 1930 DOA: 05/15/2022     0 DOS: the patient was seen and examined on 05/16/2022   Brief hospital course: Sherry [name] is a 89 y.o. 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   Sherry [name] states that she has only been at [facility] for approximately 24 hours before she was sent to the ED due to increased nausea with several episodes of vomiting.  In addition, her previously present dizziness and headache seem to have worsened in severity.  She is uncertain if her emesis was bright red or black as she did not look.  She denies any known fevers or chills, chest pain, shortness of breath or abdominal pain.  She endorses constipation.  Notes that she does not have urinary symptoms given her Foley catheter is still in.   ED course: On arrival to the ED, patient was hypertensive at 166/71 with heart rate of 79.  She was saturating at 100% on room air.  She was afebrile at 97.7.  Initial workup notable for WBC of 7.8, hemoglobin of 11.1, sodium of 131, glucose 120, BUN 18, creatinine 0.57 and GFR above 60.  COVID-19, influenza and RSV PCR negative.  Urinalysis was obtained that demonstrated large leukocytes, positive nitrites, and few bacteria in addition to over 50 WBCs/hpf.  CT of the head was obtained that did not show any acute abnormalities or changes.  CT of the abdomen was obtained with final reading still pending.  Chest x-ray was obtained with no acute cardiopulmonary disease.  TRH contacted for admission due to complicated UTI.  3/17 : Patient still symptomatic with nausea and dizziness which she attributes to her history of stroke no worsening  with ongoing UTI.  Foley was exchanged patient asymptomatic for any suprapubic tenderness.  Urine cultures growing E. coli sensitivities pending patient continued to ceftriaxone.  Assessment and Plan: * Complicated UTI (urinary tract infection)- urine cultures growing E. coli sensitivities pending.  Patient is presenting with worsening headache and dizziness, in addition to new nausea and vomiting with urinalysis concerning for UTI in the setting of Foley catheter that was exchanged on 05/09/2022.  Foley was initially placed in the setting of urinary retention secondary to CVA. CT of the abdomen demonstrates bladder wall thickening with mucosal and enhancement.  - Foley exchanged - Start Cefepime per pharmacy dosing  Nausea and vomiting Patient endorses worsening nausea and vomiting over the past 24 hours.  I wonder if may be due to underlying infection, versus part of the vertigo she has been experiencing post CVA.  Given she has been on Thorazine on recent admission with improvement, will restart but at decreased frequency due to QT prolongation  - Thorazine 25 mg twice daily - S/p 1 L fluids  Vertigo following cerebrovascular accident Patient notes that her symptoms of vertigo including headache and dizziness have worsened since leaving the hospital.  She worries that it may be due to medication changes that were made at [facility].  Pharmacy is working on obtaining medication list from [facility], but per recent admission encounter, she was receiving Thorazine 3 times daily prior to discharge.  Given her UTC today is 480, will restart at a decreased frequency  - Thorazine 25 mg  twice daily  Left-sided cerebrovascular accident (CVA)  No acute intracranial abnormality seen on CT head today  - Continue outpatient follow-up with neurology - Continue home aspirin and statin  Essential hypertension - Restart amlodipine at decreased dose of 5 mg daily given orthostasis on prior  admission  Constipation Significant stool burden seen on CT imaging.  - Soapsuds enema - Start daily Miralax and Senna      Subjective: Patient continues to stay nauseous and have dizziness which she attributes to her history of stroke.  Has not happy at the rehab facility.  Would like to stay here till she is asymptomatic.  Vital labs and imaging reviewed.   Physical Exam: Vitals:   05/15/22 1850 05/15/22 2000 05/15/22 2211 05/16/22 0432  BP: (!) 188/67 (!) 176/77  (!) 154/63  Pulse: 69 80  84  Resp:  16  16  Temp: 98.1 F (36.7 C) 98.1 F (36.7 C)  98 F (36.7 C)  TempSrc: Oral Oral  Oral  SpO2: 98% 99%  95%  Weight:   56.7 kg   Height:   5\' 2"  (1.575 m)    Physical Exam Eyes:     Extraocular Movements: Extraocular movements intact.     Pupils: Pupils are equal, round, and reactive to light.  Cardiovascular:     Rate and Rhythm: Normal rate and regular rhythm.     Heart sounds: No murmur heard. Pulmonary:     Effort: Pulmonary effort is normal.     Breath sounds: Normal breath sounds.  Abdominal:     Palpations: Abdomen is soft.  Genitourinary:    Comments: Foley in place Musculoskeletal:        General: Normal range of motion.     Cervical back: Normal range of motion and neck supple.  Skin:    General: Skin is warm.  Neurological:     Mental Status: She is alert.     Comments: No new focal deficits  Psychiatric:        Mood and Affect: Mood normal.        Behavior: Behavior normal.     Data Reviewed:  Results are pending, will review when available.  Family Communication: Husband at bedside updated about the status of the patient.  Disposition: Status is: Observation The patient remains OBS appropriate and will d/c before 2 midnights.  Planned Discharge Destination: Skilled nursing facility    Time spent: 35  minutes  Author: Oran Rein, MD 05/16/2022 1:54 PM  For on call review [email or web address].

## 2022-05-16 NOTE — Progress Notes (Signed)
patient is listed as DNR. I was going to put the purple DNR bracelet on her but verbalized wishes to be a full code.  She said "if there is a chance I can survive then do what you can" but she does not want to be put on life support. Dr Dwyane Dee notified and changed order to full code

## 2022-05-16 NOTE — TOC Initial Note (Signed)
Transition of Care [facility]) - Initial/Assessment Note    Patient Details  Name: Sherry [name] MRN: [id number] Date of Birth: [birth year]  Transition of Care [facility]) CM/SW Contact:    [address], [place] Phone Number: 05/16/2022, 10:06 AM  Clinical Narrative:                  Per Chart Review pt from [facility] for STR (dc from [place] to [facility] on 05/12/22), pt did not want to work with OT today. Per notes pt does not want to return to [facility], staff at [facility] also stating they cannot care for pt and she needs a higher level of care (ED notes). CSW attempted to speak with pt's husband, no answer. TOC will continue to follow.         Patient Goals and CMS Choice            Expected Discharge Plan and Services                                              Prior Living Arrangements/Services                       Activities of Daily Living Home Assistive Devices/Equipment: Gilford Rile (specify type) ADL Screening (condition at time of admission) Patient's cognitive ability adequate to safely complete daily activities?: No Is the patient deaf or have difficulty hearing?: Yes Does the patient have difficulty seeing, even when wearing glasses/contacts?: No Does the patient have difficulty concentrating, remembering, or making decisions?: Yes Patient able to express need for assistance with ADLs?: Yes Does the patient have difficulty dressing or bathing?: Yes Independently performs ADLs?: No Communication: Independent Dressing (OT): Needs assistance Is this a change from baseline?: Pre-admission baseline Grooming: Needs assistance Is this a change from baseline?: Pre-admission baseline Feeding: Needs assistance Is this a change from baseline?: Pre-admission baseline Bathing: Needs assistance Is this a change from baseline?: Pre-admission baseline Toileting: Needs assistance Is this a change from baseline?: Pre-admission baseline In/Out Bed: Needs  assistance Is this a change from baseline?: Pre-admission baseline Walks in Home: Needs assistance Is this a change from baseline?: Pre-admission baseline Does the patient have difficulty walking or climbing stairs?: Yes Weakness of Legs: Both Weakness of Arms/Hands: Both  Permission Sought/Granted                  Emotional Assessment              Admission diagnosis:  Complicated UTI (urinary tract infection) [N39.0] Urinary tract infection without hematuria, site unspecified [N39.0] Hypertension, unspecified type [I10] Patient Active Problem List   Diagnosis Date Noted   Complicated UTI (urinary tract infection) 05/15/2022   Nausea and vomiting 05/15/2022   Constipation 05/15/2022   Antiplatelet or antithrombotic long-term use 05/04/2022   Peptic ulcer 05/03/2022   Upper GI bleed 05/02/2022   Anemia, posthemorrhagic, acute 05/02/2022   Acute urinary retention 05/02/2022   Vertigo following cerebrovascular accident 05/02/2022   Leukocytosis 05/02/2022   Lactic acidosis 05/02/2022   Hypertensive emergency 04/26/2022   Cerebellar stroke ([place]) 04/26/2022   Left-sided cerebrovascular accident (CVA) ([place]) 04/25/2022   CVA (cerebral vascular accident) ([place]) 04/25/2022   UTI (urinary tract infection) [id number]   Acute metabolic encephalopathy 0000000   Encephalopathy 01/04/2022   Hypertensive urgency 01/03/2022   Back pain 05/29/2021  Left hip pain 05/28/2021   Hypokalemia 05/28/2021   Orthostatic hypotension 05/25/2021   Left sided sciatica 05/24/2021   Duodenitis 05/18/2021   Anemia of chronic disease 05/16/2021   Abdominal pain 05/15/2021   Thrombocytosis 05/15/2021   Essential hypertension 04/30/2021   Spinal stenosis at L4-L5 level 04/27/2021   Intractable low back pain 04/27/2021   Low back pain 04/26/2021   Closed C1 fracture ([place]) 02/12/2020   Neck fracture ([place]) 02/11/2020   Chest pain with moderate risk for cardiac etiology 05/05/2017    Shortness of breath 05/05/2017   Hyperlipidemia 09/25/2016   Neuropathy 09/25/2016   Statin intolerance 09/25/2016   Anxiety 09/25/2016   Palpitations 09/25/2016   PCP:  Rusty Aus, MD Pharmacy:   [place] [address] [place] [address] Phone: [phone] Fax: [phone]  [facility] [place], [place] [address] Phone: [phone] Fax: [phone]  [facility] [address] [place] [address] Phone: [phone] Fax: [phone]  [id number] - [place], [place] [address] Phone: [phone] Fax: [phone]     Social Determinants of Health (SDOH) Social History: [place]: No Food Insecurity (05/15/2022)  Housing: Low Risk  (05/15/2022)  Transportation Needs: No Transportation Needs (05/15/2022)  Utilities: Not At Risk (05/15/2022)  Tobacco Use: Low Risk  (05/14/2022)   SDOH Interventions:     Readmission Risk Interventions    01/07/2022    2:50 PM  Readmission Risk Prevention Plan  Post Dischage Appt Complete  Medication Screening Complete  Transportation Screening Complete

## 2022-05-16 NOTE — Progress Notes (Signed)
PT Cancellation Note  Patient Details Name: Sherry [name] MRN: [id number] DOB: 11-28-1930   Cancelled Treatment:    Reason Eval/Treat Not Completed: Pain limiting ability to participate;Other (comment) (Patient refused OT this morning due to pain so PT deferred eval to this afternoon but patient also declined PT stating she just wanted to rest and having pain just not up to trying to stand or even being evaluated today.) Instructed patient that someone would attempt again tomorrow as able. Patient verbalized understanding.    [name] 05/16/2022, 2:16 PM

## 2022-05-16 NOTE — Consult Note (Signed)
Pharmacy Antibiotic Note  Sherry [name] is a 89 y.o. female admitted on 05/15/2022 with UTI.  Pharmacy has been consulted for cefepime dosing.  CrCl 37.7  Plan: Cefepime 2 gm IV Q24H(renally adjusted)  Height: 5\' 2"  (157.5 cm) Weight: 56.7 kg (125 lb) IBW/kg (Calculated) : 50.1  Temp (24hrs), Avg:98 F (36.7 C), Min:97.7 F (36.5 C), Max:98.1 F (36.7 C)  Recent Labs  Lab 05/10/22 0657 05/12/22 0554 05/15/22 1404 05/16/22 0418  WBC 7.8 8.0 7.8 7.0  CREATININE 0.63 0.63 0.57 0.57     Estimated Creatinine Clearance: 37.7 mL/min (by C-G formula based on SCr of 0.57 mg/dL).    Allergies  Allergen Reactions   Morphine     Other reaction(s): Other (See Comments) Apnea with large dose Respiratory Distress    Buprenorphine Hcl     Other reaction(s): Other (See Comments), Other (See Comments) Respiratory Distress Respiratory Distress    Codeine    Epinephrine     Other reaction(s): Other (See Comments) Severe anxiety; "makes my heart race" - would like to avoid during surgery   Morphine And Related Other (See Comments)    Respiratory Distress    Percocet [Oxycodone-Acetaminophen]     Antimicrobials this admission: 3/16 CRO x 1 dose in ED 3/16 cefepime >>   Dose adjustments this admission: N/A  Microbiology results: 3/16 UCx: pending    Thank you for allowing pharmacy to be a part of this patient's care.  Lance Coon A Maura Braaten 05/16/2022 7:48 AM

## 2022-05-16 NOTE — Progress Notes (Addendum)
OT Cancellation Note  Patient Details Name: Sherry [name] MRN: [id number] DOB: Sep 13, 1930   Cancelled Treatment:    Reason Eval/Treat Not Completed: Pain limiting ability to participate;Other (comment). OT orders received, chart reviewed.   [id number]: Pt endorsing 7/10 headache and nausea. RN planning to give pt pain meds. Pt declining all EOB/OOB mobility at this time stating "I can't move." Will re-attempt OT evaluation as able.  1606: Pt received supine in bed, headache seems to be improved. Pt still adamantly declining all therapeutic intervention at this time despite encouragement stating "I would rather wait until tomorrow". Will re-attempt OT evaluation at later date/time.  Lanelle Bal  [facility] 05/16/2022, 9:03 AM

## 2022-05-17 DIAGNOSIS — N390 Urinary tract infection, site not specified: Secondary | ICD-10-CM | POA: Diagnosis not present

## 2022-05-17 LAB — CBC
HCT: 32.9 % — ABNORMAL LOW (ref 36.0–46.0)
Hemoglobin: 11 g/dL — ABNORMAL LOW (ref 12.0–15.0)
MCH: 30.1 pg (ref 26.0–34.0)
MCHC: 33.4 g/dL (ref 30.0–36.0)
MCV: 90.1 fL (ref 80.0–100.0)
Platelets: 451 10*3/uL — ABNORMAL HIGH (ref 150–400)
RBC: 3.65 MIL/uL — ABNORMAL LOW (ref 3.87–5.11)
RDW: 13.4 % (ref 11.5–15.5)
WBC: 7.2 10*3/uL (ref 4.0–10.5)
nRBC: 0 % (ref 0.0–0.2)

## 2022-05-17 LAB — BASIC METABOLIC PANEL
Anion gap: 10 (ref 5–15)
BUN: 18 mg/dL (ref 8–23)
CO2: 22 mmol/L (ref 22–32)
Calcium: 8.9 mg/dL (ref 8.9–10.3)
Chloride: 103 mmol/L (ref 98–111)
Creatinine, Ser: 0.58 mg/dL (ref 0.44–1.00)
GFR, Estimated: >60 mL/min (ref >60)
Glucose, Bld: 102 mg/dL — ABNORMAL HIGH (ref 70–99)
Potassium: 3.3 mmol/L — ABNORMAL LOW (ref 3.5–5.1)
Sodium: 135 mmol/L (ref 135–145)

## 2022-05-17 LAB — URINE CULTURE: Culture: >=100000 — AB

## 2022-05-17 MED ORDER — TAMSULOSIN HCL 0.4 MG PO CAPS
0.4000 mg | ORAL_CAPSULE | Freq: Every day | ORAL | Status: DC
  Administered 2022-05-17 – 2022-05-21 (×5): 0.4 mg via ORAL
  Filled 2022-05-17 (×5): qty 1

## 2022-05-17 MED ORDER — SODIUM CHLORIDE 0.9 % IV SOLN
1.0000 g | INTRAVENOUS | Status: DC
  Administered 2022-05-17: 1 g via INTRAVENOUS
  Filled 2022-05-17: qty 1

## 2022-05-17 MED ORDER — CEFDINIR 300 MG PO CAPS
300.0000 mg | ORAL_CAPSULE | Freq: Two times a day (BID) | ORAL | Status: DC
  Administered 2022-05-18 – 2022-05-21 (×6): 300 mg via ORAL
  Filled 2022-05-17 (×7): qty 1

## 2022-05-17 NOTE — Progress Notes (Signed)
Progress Note   Patient: Sherry [name] [id number] DOB: 1930/07/30 DOA: 05/15/2022     1 DOS: the patient was seen and examined on 05/17/2022   Brief hospital course: Sherry [name] is a 89 y.o. 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   Sherry [name] states that she has only been at [facility] for approximately 24 hours before she was sent to the ED due to increased nausea with several episodes of vomiting.  In addition, her previously present dizziness and headache seem to have worsened in severity.  She is uncertain if her emesis was bright red or black as she did not look.  She denies any known fevers or chills, chest pain, shortness of breath or abdominal pain.  She endorses constipation.  Notes that she does not have urinary symptoms given her Foley catheter is still in.   ED course: On arrival to the ED, patient was hypertensive at 166/71 with heart rate of 79.  She was saturating at 100% on room air.  She was afebrile at 97.7.  Initial workup notable for WBC of 7.8, hemoglobin of 11.1, sodium of 131, glucose 120, BUN 18, creatinine 0.57 and GFR above 60.  COVID-19, influenza and RSV PCR negative.  Urinalysis was obtained that demonstrated large leukocytes, positive nitrites, and few bacteria in addition to over 50 WBCs/hpf.  CT of the head was obtained that did not show any acute abnormalities or changes.  CT of the abdomen was obtained with final reading still pending.  Chest x-ray was obtained with no acute cardiopulmonary disease.  TRH contacted for admission due to complicated UTI.  3/17 : Patient still symptomatic with nausea and dizziness which she attributes to her history of stroke no worsening  with ongoing UTI.  Foley was exchanged patient asymptomatic for any suprapubic tenderness.  Urine cultures growing E. coli sensitivities pending patient continued to ceftriaxone.  Assessment and Plan: * Complicated UTI (urinary tract infection)- urine cultures growing E. coli   Urinary retention Patient is presenting with worsening headache and dizziness, in addition to new nausea and vomiting with urinalysis concerning for UTI in the setting of Foley catheter that was exchanged on 05/09/2022.  Foley was initially placed in the setting of urinary retention secondary to CVA. CT of the abdomen demonstrates bladder wall thickening with mucosal and enhancement. Failed trial of void at recent hospitalization - Foley exchanged - switch abx to cefdinir to complete a course - outpt uro f/u, cont flomax  Nausea and vomiting Controlled currently. Nothing acute seen on ct. Suspect this 2/2 cerebellar stroke - Thorazine 25 mg twice daily  Vertigo following cerebrovascular accident Patient notes that her symptoms of vertigo including headache and dizziness have worsened since leaving the hospital.  She worries that it may be due to medication changes that were made at [facility].  Pharmacy is working on obtaining medication list from [facility], but per recent admission encounter, she was receiving Thorazine 3 times daily prior to discharge.    - Thorazine 25 mg twice daily  Left-sided cerebrovascular accident (CVA)  No acute intracranial abnormality seen on CT head today - Continue outpatient follow-up with neurology - Continue home aspirin and statin (not on plavix 2/2 GI bleed) - pt/ot advising snf, TOC investigating whether (and  when) can return to [facility]  GI bleed, recent Peptic ulcer disease Recent endoscopy showed esophageal, gastric, and duodenal ulcers - cont bid ppi, transition to daily dosing in 2 weeks  Essential hypertension - Restart amlodipine at decreased dose of 5 mg daily given  orthostasis on prior admission  Constipation Significant stool burden seen on CT imaging. - daily Miralax and Senna      Subjective: currently pain free, tolerating diet, denies vertigo or nausea/vomiting   Physical Exam: Vitals:   05/16/22 1743 05/16/22 2000 05/17/22 0500 05/17/22 0801  BP: (!) 147/61 (!) 141/58 134/63 (!) 152/66  Pulse: 87 81 85 87  Resp: 16 16 16 20   Temp: 98.7 F (37.1 C) 98.4 F (36.9 C) 98.9 F (37.2 C) 98.4 F (36.9 C)  TempSrc:  Oral Oral Oral  SpO2: 98% 98% 99% 99%  Weight:      Height:       Physical Exam Eyes:     Extraocular Movements: Extraocular movements intact.     Pupils: Pupils are equal, round, and reactive to light.  Cardiovascular:     Rate and Rhythm: Normal rate and regular rhythm.     Heart sounds: No murmur heard. Pulmonary:     Effort: Pulmonary effort is normal.     Breath sounds: Normal breath sounds.  Abdominal:     Palpations: Abdomen is soft.  Genitourinary:    Comments: Foley in place Musculoskeletal:        General: Normal range of motion.     Cervical back: Normal range of motion and neck supple.  Skin:    General: Skin is warm.  Neurological:     Mental Status: She is alert.     Comments: No new focal deficits  Psychiatric:        Mood and Affect: Mood normal.        Behavior: Behavior normal.     Data Reviewed:  Results are pending, will review when available.  Family Communication: Husband updated telephonically 3/18  Disposition: Status is: Observation The patient remains OBS appropriate and will d/c before 2 midnights.  Planned Discharge Destination: Skilled nursing facility    Time spent: 35  minutes  Author: Desma Maxim, MD 05/17/2022 2:15 PM  For on call review [email or web address].

## 2022-05-17 NOTE — Evaluation (Signed)
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 She presents with worsening dizziness and headache. Found to have complicated UTI.   Clinical Impression   Patient agreeable to OT evaluation with encouragement. Pt presenting with decreased independence in self care, balance, functional mobility/transfers, endurance, and safety awareness. Prior to recent hospitalizations, pt lived with spouse and was independent for ADLs/IADLs. Pt endorsed dizziness at beginning of session while in supine that worsened with any positional changes. Pt currently functioning at Mod-Max A for bed mobility, Max A for LB dressing, and Max A for lateral scooting along EOB. Limited sitting tolerance at EOB (~5 min). OT noting decreased LUE ROM, strength, and FMC. Pt will benefit from acute OT to increase overall independence in the areas of ADLs and functional mobility in order to safely discharge to next venue of care. OT recommends ongoing therapy upon discharge to maximize safety and independence with ADLs, decrease fall risk, decrease caregiver burden, and promote return to PLOF.     Recommendations for follow up therapy are one component of a multi-disciplinary discharge planning process, led by the attending physician.  Recommendations may be updated based on patient status, additional functional criteria and insurance authorization.   Follow Up Recommendations  Skilled nursing-short term rehab (<3 hours/day)     Assistance Recommended at Discharge Frequent or constant Supervision/Assistance  Patient can return home with the following Two people to help with walking and/or transfers;A lot of help with  bathing/dressing/bathroom;Assistance with cooking/housework;Direct supervision/assist for medications management;Direct supervision/assist for financial management;Assist for transportation;Help with stairs or ramp for entrance    Functional Status Assessment  Patient has had a recent decline in their functional status and demonstrates the ability to make significant improvements in function in a reasonable and predictable amount of time.  Equipment Recommendations  Other (comment) (defer to next venue of care)    Recommendations for Other Services       Precautions / Restrictions Precautions Precautions: Fall Precaution Comments: dizziness/nausea with positional changes Restrictions Weight Bearing Restrictions: No      Mobility Bed Mobility Overal bed mobility: Needs Assistance Bed Mobility: Supine to Sit, Sit to Supine, Rolling Rolling: Min assist   Supine to sit: Mod assist Sit to supine: Max assist   General bed mobility comments: Max VCs required for technique/sequencing, assistance provided for trunk and BLE support    Transfers Overall transfer level: Needs assistance Equipment used: None Transfers: Bed to chair/wheelchair/BSC            Lateral/Scoot Transfers: Max assist General transfer comment: Pt completed 1 lateral scoot to L side with Max A and VCs for sequencing, unable to attempt standing this date 2/2 nausea and dizziness with position changes      Balance Overall balance assessment: Needs assistance Sitting-balance support: Feet supported Sitting balance-Leahy Scale:  (poor to fair) Sitting balance - Comments: Min guard for static sitting. Min A for dynamic activity, anterior weight shifting                                   ADL either performed or assessed with clinical judgement   ADL Overall ADL's : Needs assistance/impaired  Grooming: Set up;Sitting;Min guard;Wash/dry face Grooming Details (indicate cue type and reason):  using R hand, difficulty bringing L hand to face (very ataxic)             Lower Body Dressing: Maximal assistance;Bed level Lower Body Dressing Details (indicate cue type and reason): socks               General ADL Comments: Pt is functionally limited 2/2 dizziness, generalized weakness, and decreased activity tolerance. Only tolerated sitting EOB for ~5 min with Min A initally then improving to Min guard for safety. Able to complete 1 lateral scoot toward L side with Max A before returning to supine.     Vision Patient Visual Report: Eye fatigue/eye pain/headache;Nausea/blurring vision with head movement Vision Assessment?: Yes Additional Comments: Unable to tolerate visual assessment this date, plan to be further tested in functional context. Attempting to complete tracking/visual pursuits, as soon as pt attempted to complete lateral tracking to the R pt immediately had to close her eyes 2/2 dizziness.     Perception     Praxis      Pertinent Vitals/Pain Pain Assessment Pain Assessment: No/denies pain (c/o dizziness)     Hand Dominance Right   Extremity/Trunk Assessment Upper Extremity Assessment Upper Extremity Assessment: LUE deficits/detail LUE Deficits / Details: Impaired FTN, +dysmetria, shoulder flexion <1/2 full ROM, pain with shoulder PROM to 90 deg, impaired LT sensation (pt reported stimulus feels "diminished"), poor grip strength LUE Sensation: decreased light touch LUE Coordination: decreased fine motor;decreased gross motor   Lower Extremity Assessment Lower Extremity Assessment: Defer to PT evaluation      Communication Communication Communication: No difficulties   Cognition Arousal/Alertness: Awake/alert Behavior During Therapy: Flat affect, Anxious Overall Cognitive Status: No family/caregiver present to determine baseline cognitive functioning Area of Impairment: Attention, Memory, Following commands, Safety/judgement, Awareness, Problem  solving                   Current Attention Level: Sustained Memory: Decreased recall of precautions, Decreased short-term memory Following Commands: Follows one step commands with increased time Safety/Judgement: Decreased awareness of safety, Decreased awareness of deficits Awareness: Emergent Problem Solving: Slow processing, Decreased initiation, Difficulty sequencing, Requires verbal cues, Requires tactile cues General Comments: Agreeable to OT with encouragement, generally anxious with EOB/OOB activity     General Comments  patient has increased dizziness and nausea with any movement    Exercises Other Exercises Other Exercises: OT provided education re: role of OT, OT POC, post acute recs, sitting up for all meals, EOB/OOB mobility with assistance, home/fall safety, importance of activity/participation in therapy services, bed in chair position when possible to build up tolerance for sitting upright and position changes   Shoulder Instructions      Home Living Family/patient expects to be discharged to:: Private residence Living Arrangements: Spouse/significant other Available Help at Discharge: Family;Available 24 hours/day Type of Home: House Home Access: Level entry     Home Layout: One level     Bathroom Shower/Tub: Occupational psychologist: Handicapped height Bathroom Accessibility: Yes   Home Equipment: Conservation officer, nature (2 wheels);Shower seat;Grab bars - tub/shower   Additional Comments: information  is gathered from recent hospital admission  Lives With: Spouse    Prior Functioning/Environment Prior Level of Function : History of Falls (last six months);Driving;Independent/Modified Independent             Mobility Comments: Mod I using RW, recent fall ADLs Comments: Independent with ADLs/IADLs, still driving  OT Problem List: Decreased strength;Impaired balance (sitting and/or standing);Decreased activity tolerance;Decreased  safety awareness;Decreased knowledge of use of DME or AE;Decreased knowledge of precautions;Pain;Decreased coordination;Impaired sensation;Decreased range of motion;Impaired vision/perception;Impaired UE functional use      OT Treatment/Interventions: Self-care/ADL training;DME and/or AE instruction;Therapeutic activities;Balance training;Patient/family education;Neuromuscular education;Energy conservation;Cognitive remediation/compensation;Therapeutic exercise;Manual therapy;Modalities;Visual/perceptual remediation/compensation;Splinting    OT Goals(Current goals can be found in the care plan section) Acute Rehab OT Goals Patient Stated Goal: feel better OT Goal Formulation: With patient Time For Goal Achievement: 05/31/22 Potential to Achieve Goals: Fair   OT Frequency: Min 2X/week    Co-evaluation PT/OT/SLP Co-Evaluation/Treatment: Yes Reason for Co-Treatment: For patient/therapist safety;To address functional/ADL transfers PT goals addressed during session: Mobility/safety with mobility OT goals addressed during session: ADL's and self-care      AM-PAC OT "6 Clicks" Daily Activity     Outcome Measure Help from another person eating meals?: A Little Help from another person taking care of personal grooming?: A Little Help from another person toileting, which includes using toliet, bedpan, or urinal?: Total Help from another person bathing (including washing, rinsing, drying)?: A Lot Help from another person to put on and taking off regular upper body clothing?: A Lot Help from another person to put on and taking off regular lower body clothing?: A Lot 6 Click Score: 13   End of Session Nurse Communication: Mobility status  Activity Tolerance: Other (comment) (dizziness, nausea) Patient left: in bed;with call bell/phone within reach;with bed alarm set  OT Visit Diagnosis: Other abnormalities of gait and mobility (R26.89);Muscle weakness (generalized) (M62.81);Dizziness and  giddiness (R42);Other symptoms and signs involving the nervous system [id number])                Time: [id number] OT Time Calculation (min): 16 min Charges:  OT General Charges $OT Visit: 1 Visit OT Evaluation $OT Eval Low Complexity: 1 Low  [facility] MS, OTR/L ascom [phone]  05/17/22, 12:52 PM

## 2022-05-17 NOTE — Evaluation (Signed)
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 She presents with worsening dizziness and headache. Found to have complicated UTI.   Clinical Impression  Patient agreeable to PT evaluation with encouragement. She complains of continued dizziness that is exacerbated with any movement. She required physical assistance with bed mobility and to maintain dynamic sitting balance.  She was unable to attempt standing due to increased nausea/dizziness but required maximal assistance for lateral scoot transfer in bed. She declined getting up to the chair at this time. Recommend to continue PT to maximize independence and decrease caregiver burden. She is not at her baseline level of functional mobility and anticipate the need for frequent or constant supervision/assistance at discharge.      Recommendations for follow up therapy are one component of a multi-disciplinary discharge planning process, led by the attending physician.  Recommendations may be updated based on patient status, additional functional criteria and insurance authorization.  Follow Up Recommendations Skilled nursing-short term rehab (<3 hours/day) Can patient physically be transported by private vehicle: No    Assistance Recommended at Discharge Frequent or constant Supervision/Assistance  Patient can return home with the following  A lot of help with walking and/or transfers;A lot of help with bathing/dressing/bathroom;Assist for transportation;Help with stairs or ramp for entrance;Assistance with cooking/housework    Equipment Recommendations  (to be determined at next level of care)  Recommendations for Other Services        Functional Status Assessment Patient has had a recent decline in their functional status and demonstrates the ability to make significant improvements in function in a reasonable and predictable amount of time.     Precautions / Restrictions Precautions Precautions: Fall Restrictions Weight Bearing Restrictions: No      Mobility  Bed Mobility Overal bed mobility: Needs Assistance Bed Mobility: Supine to Sit, Sit to Supine     Supine to sit: Mod assist Sit to supine: Max assist   General bed mobility comments: verbal cues for technique. assistance for trunk and BLE support. increased support required for returning to bed    Transfers                  Lateral/Scoot Transfers: Max assist General transfer comment: one scooting bout to the left with maximal assistance and cues for technique. unable to progress to standing due to increased nausea and dizziness with position changes. cues for anterior weight shifting and weight acceptance on BLE    Ambulation/Gait                  Stairs            Wheelchair Mobility    Modified Rankin (Stroke Patients Only)       Balance Overall balance assessment: Needs assistance Sitting-balance support: Feet supported Sitting balance-Leahy Scale:  (poor to fair) Sitting balance - Comments: Min guard for static sitting. Min A for dynamic activity, anterior weight shifting                                     Pertinent Vitals/Pain Pain Assessment Pain Assessment: No/denies pain    Home Living Family/patient expects to be discharged to:: Private  residence Living Arrangements: Spouse/significant other Available Help at Discharge: Family Type of Home: House Home Access: Level entry       [place]: [place]: Conservation officer, nature (2 wheels);Shower seat;Grab bars - tub/shower Additional Comments: information  is gathered from recent hospital admission    Prior Function Prior Level  of Function : Needs assist;History of Falls (last six months)             Mobility Comments: uses RW for mobility, recent fall       Hand Dominance        Extremity/Trunk Assessment   Upper Extremity Assessment Upper Extremity Assessment: Defer to OT evaluation;LUE deficits/detail LUE Coordination: decreased fine motor;decreased gross motor    Lower Extremity Assessment Lower Extremity Assessment: LLE deficits/detail LLE Deficits / Details: patient able to complete partial SLR, dorsiflexion 4/5, plantarflexion 4/5 LLE Sensation: decreased light touch       Communication   Communication: No difficulties  Cognition Arousal/Alertness: Awake/alert Behavior During Therapy: Flat affect Overall Cognitive Status: No family/caregiver present to determine baseline cognitive functioning                         Following Commands: Follows one step commands with increased time Safety/Judgement: Decreased awareness of safety, Decreased awareness of deficits   Problem Solving: Slow processing, Decreased initiation, Difficulty sequencing, Requires verbal cues, Requires tactile cues          General Comments General comments (skin integrity, edema, etc.): patient has increased dizziness and nausea with any movement    Exercises     Assessment/Plan    PT Assessment Patient needs continued PT services  PT Problem List Decreased strength;Decreased range of motion;Decreased activity tolerance;Decreased balance;Decreased mobility;Decreased safety awareness       PT Treatment Interventions DME instruction;Gait training;Stair training;Functional mobility training;Therapeutic activities;Therapeutic exercise;Balance training;Neuromuscular re-education;Cognitive remediation;Patient/family education    PT Goals (Current goals can be found in the Care Plan section)  Acute Rehab PT Goals Patient Stated Goal: to feel better PT Goal Formulation: With patient Time For Goal  Achievement: 05/31/22 Potential to Achieve Goals: Fair    Frequency Min 2X/week     Co-evaluation PT/OT/SLP Co-Evaluation/Treatment: Yes Reason for Co-Treatment: For patient/therapist safety;To address functional/ADL transfers PT goals addressed during session: Mobility/safety with mobility         AM-PAC PT "6 Clicks" Mobility  Outcome Measure Help needed turning from your back to your side while in a flat bed without using bedrails?: A Little Help needed moving from lying on your back to sitting on the side of a flat bed without using bedrails?: A Lot Help needed moving to and from a bed to a chair (including a wheelchair)?: A Lot Help needed standing up from a chair using your arms (e.g., wheelchair or bedside chair)?: A Lot Help needed to walk in hospital room?: A Lot Help needed climbing 3-5 steps with a railing? : A Lot 6 Click Score: 13    End of Session   Activity Tolerance: Patient limited by fatigue (dizziness, nausea) Patient left: in bed;with call bell/phone within reach;with bed alarm set Nurse Communication: Mobility status PT Visit Diagnosis: Muscle weakness (generalized) (M62.81);Difficulty in walking, not elsewhere classified (R26.2)    Time: [id number] PT Time Calculation (min) (ACUTE ONLY): 16 min   Charges:   PT Evaluation $PT Eval Low Complexity: 1 Low          Minna Merritts, PT, MPT   Percell Locus 05/17/2022,  11:02 AM

## 2022-05-17 NOTE — TOC Progression Note (Signed)
Transition of Care [facility]) - Progression Note    Patient Details  Name: Sherry [name] MRN: [id number] Date of Birth: 08/03/1930  [facility] CM/SW Contact  Beverly Sessions, RN Phone Number: 05/17/2022, 3:31 PM  Clinical Narrative:     MD has confirmed with patient and her spouse that patient does want to return to [facility] left with Crystal at [facility] to confirm patient can return and to ask if patient will have to have another 3 night stay       Expected Discharge Plan and Services                                               Social Determinants of Health (SDOH) Interventions SDOH Screenings   Food Insecurity: No Food Insecurity (05/15/2022)  Housing: [place]  (05/15/2022)  Transportation Needs: No Transportation Needs (05/15/2022)  Utilities: Not At Risk (05/15/2022)  Tobacco Use: Low Risk  (05/16/2022)    Readmission Risk Interventions    01/07/2022    2:50 PM  Readmission Risk Prevention Plan  Post Dischage Appt Complete  Medication Screening Complete  Transportation Screening Complete

## 2022-05-18 ENCOUNTER — Inpatient Hospital Stay: Payer: Medicare Other

## 2022-05-18 DIAGNOSIS — I639 Cerebral infarction, unspecified: Secondary | ICD-10-CM | POA: Diagnosis not present

## 2022-05-18 DIAGNOSIS — I10 Essential (primary) hypertension: Secondary | ICD-10-CM | POA: Diagnosis not present

## 2022-05-18 DIAGNOSIS — N390 Urinary tract infection, site not specified: Secondary | ICD-10-CM | POA: Diagnosis not present

## 2022-05-18 NOTE — Progress Notes (Addendum)
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 She presents with worsening dizziness and headache. Found to have complicated UTI.    PT Comments    Patient was agreeable to PT with encouragement. She continues to have motion-provoked dizziness that worsens with sitting upright. She required physical assistance for bed mobility with decreased gross motor coordination noted with movement of left leg. She required intermittent minimal assistance to maintain sitting balance with left lean. She described the dizziness as "my head is spinning" with no change in dizziness reported after 5 minutes of sitting upright. She prefers to keep her eyes closed, however encouraged patient to keep eyes opened. Unable to initiate seated vestibular exercises with oculomotor or gaze stabilization techniques (could possibly help with dizziness) due to poor activity tolerance. No reported dizziness with smooth pursuit exercise from bed level prior to mobilizing and no nystagmus noted at any point. PT will continue to follow to maximize independence and decrease caregiver burden.    Recommendations for follow up therapy are one component of a multi-disciplinary discharge planning process, led by the attending physician.  Recommendations may be updated based on patient status, additional functional criteria and insurance authorization.  Follow Up Recommendations  Skilled nursing-short term rehab (<3 hours/day) Can patient physically be transported by private vehicle: No   Assistance Recommended at Discharge Frequent or constant Supervision/Assistance  Patient can return home with the following A lot of help with walking  and/or transfers;A lot of help with bathing/dressing/bathroom;Assist for transportation;Help with stairs or ramp for entrance;Assistance with cooking/housework   Equipment Recommendations   (to be determined at next level of care)    Recommendations for Other Services       Precautions / Restrictions Precautions Precautions: Fall Precaution Comments: dizziness with positional changes Restrictions Weight Bearing Restrictions: No     Mobility  Bed Mobility Overal bed mobility: Needs Assistance Bed Mobility: Sit to Supine, Supine to Sit     Supine to sit: Mod assist Sit to supine: Mod assist   General bed mobility comments: verbal cues for sequencing and technique. noted decreased gross motor coordination with movement of left leg    Transfers                   General transfer comment: unable to progress to standing due to increased dizziness reported with sitting upright that did not subside after 5 minutes    Ambulation/Gait                   Stairs             Wheelchair Mobility    Modified Rankin (Stroke Patients Only)       Balance Overall balance assessment: Needs assistance Sitting-balance support: Feet supported Sitting balance-Leahy Scale: Poor Sitting balance - Comments: Min A required to maintain sitting balance with left lean Postural control: Left lateral lean                                  Cognition Arousal/Alertness: Awake/alert Behavior During Therapy: WFL for tasks assessed/performed Overall Cognitive Status: No family/caregiver present to determine baseline cognitive functioning Area of Impairment: Attention, Memory, Following commands,  Safety/judgement, Awareness, Problem solving                   Current Attention Level: Sustained Memory: Decreased recall of precautions, Decreased short-term memory Following Commands: Follows one step commands with increased time Safety/Judgement: Decreased  awareness of safety, Decreased awareness of deficits   Problem Solving: Slow processing, Decreased initiation, Difficulty sequencing, Requires verbal cues, Requires tactile cues          Exercises      General Comments General comments (skin integrity, edema, etc.): patient describes the dizziness as her "head is spinning". the dizziness does appear to be motion-provoked with increased dizziness reported with sitting upright. smooth pursuits initiated in the bed (before mobilizing) without increased dizziness reported. unable to initiate smooth pursuits or gaze stabilization exercises while sitting due to poor participation, limited activity tolerance. blood pressure supine 150/66 and sitting was 145/65.      Pertinent Vitals/Pain Pain Assessment Pain Assessment: No/denies pain    Home Living                          Prior Function            PT Goals (current goals can now be found in the care plan section) Acute Rehab PT Goals Patient Stated Goal: to feel better PT Goal Formulation: With patient Time For Goal Achievement: 05/31/22 Potential to Achieve Goals: Fair Progress towards PT goals: Progressing toward goals    Frequency    Min 2X/week      PT Plan Current plan remains appropriate    Co-evaluation              AM-PAC PT "6 Clicks" Mobility   Outcome Measure  Help needed turning from your back to your side while in a flat bed without using bedrails?: A Little Help needed moving from lying on your back to sitting on the side of a flat bed without using bedrails?: A Lot Help needed moving to and from a bed to a chair (including a wheelchair)?: A Lot Help needed standing up from a chair using your arms (e.g., wheelchair or bedside chair)?: A Lot Help needed to walk in hospital room?: A Lot Help needed climbing 3-5 steps with a railing? : A Lot 6 Click Score: 13    End of Session   Activity Tolerance:  (limited by dizziness) Patient left:  in bed;with call bell/phone within reach Nurse Communication: Mobility status PT Visit Diagnosis: Muscle weakness (generalized) (M62.81);Difficulty in walking, not elsewhere classified (R26.2)     Time: [id number] PT Time Calculation (min) (ACUTE ONLY): 18 min  Charges:  $Therapeutic Activity: 8-22 mins                     Minna Merritts, PT, MPT    Percell Locus 05/18/2022, 1:38 PM

## 2022-05-18 NOTE — Progress Notes (Signed)
Progress Note   Patient: Sherry [name] [id number] DOB: [birth year] DOA: 05/15/2022     2 DOS: the patient was seen and examined on 05/18/2022   Brief hospital course: Sherry [name] is a 89 y.o. 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   Sherry [name] states that she has only been at [facility] for approximately 24 hours before she was sent to the ED due to increased nausea with several episodes of vomiting.  In addition, her previously present dizziness and headache seem to have worsened in severity.  She is uncertain if her emesis was bright red or black as she did not look.  She denies any known fevers or chills, chest pain, shortness of breath or abdominal pain.  She endorses constipation.  Notes that she does not have urinary symptoms given her Foley catheter is still in.   ED course: On arrival to the ED, patient was hypertensive at 166/71 with heart rate of 79.  She was saturating at 100% on room air.  She was afebrile at 97.7.  Initial workup notable for WBC of 7.8, hemoglobin of 11.1, sodium of 131, glucose 120, BUN 18, creatinine 0.57 and GFR above 60.  COVID-19, influenza and RSV PCR negative.  Urinalysis was obtained that demonstrated large leukocytes, positive nitrites, and few bacteria in addition to over 50 WBCs/hpf.  CT of the head was obtained that did not show any acute abnormalities or changes.  CT of the abdomen was obtained with final reading still pending.  Chest x-ray was obtained with no acute cardiopulmonary disease.  TRH contacted for admission due to complicated UTI.  3/17 : Patient still symptomatic with nausea and dizziness which she attributes to her history of stroke no worsening  with ongoing UTI.  Foley was exchanged patient asymptomatic for any suprapubic tenderness.  Urine cultures growing E. coli sensitivities pending patient continued to ceftriaxone.  Assessment and Plan: * Complicated UTI (urinary tract infection)- urine cultures growing E. coli   Urinary retention Patient is presenting with worsening headache and dizziness, in addition to new nausea and vomiting with urinalysis concerning for UTI in the setting of Foley catheter that was exchanged on 05/09/2022.  Foley was initially placed in the setting of urinary retention secondary to CVA. CT of the abdomen demonstrates bladder wall thickening with mucosal and enhancement. Failed trial of void at recent hospitalization - Foley exchanged - switch abx to cefdinir to complete a course - outpt uro f/u, cont flomax  Nausea and vomiting Controlled currently. Nothing acute seen on ct of abdomen/pelvis. Suspect this is 2/2 infarct - Thorazine 25 mg twice daily  Vertigo following cerebrovascular accident Patient notes that her symptoms of vertigo including headache and dizziness have not improved since leaving the hospital.  Repeat MRI this morning shows new infarct to the medulla - vestibular PT ordered, will need referral for that as outpt   - Thorazine 25 mg twice daily  CVA Recent admit for left cerebellar infarct. Given ongoing vertigo repeat MRI today shows new infarct to left medulla - Cneurology to see - Continue home aspirin and statin (not on plavix 2/2 recent GI bleed) - pt/ot advising snf, can return to [facility] when medically stable  GI bleed, recent Peptic ulcer disease Recent endoscopy showed esophageal,  gastric, and duodenal ulcers - cont bid ppi, transition to daily dosing in 2 weeks  Essential hypertension - Restart amlodipine at decreased dose of 5 mg daily given orthostasis on prior admission  Constipation Significant stool burden seen on CT imaging. - daily Miralax and Senna       Subjective: intermittent vertigo persists   Physical Exam: Vitals:   05/17/22 1521 05/17/22 1954 05/18/22 0413 05/18/22 0811  BP: (!) 140/63 138/60 137/66 (!) 150/70  Pulse: 94 85 89 90  Resp: 20 18 18 18   Temp: 97.6 F (36.4 C) 99 F (37.2 C) 97.6 F (36.4 C) 97.9 F (36.6 C)  TempSrc:  Oral    SpO2: 97% 98% 98% 99%  Weight:      Height:       Physical Exam Eyes:     Extraocular Movements: Extraocular movements intact.     Pupils: Pupils are equal, round, and reactive to light.  Cardiovascular:     Rate and Rhythm: Normal rate and regular rhythm.     Heart sounds: No murmur heard. Pulmonary:     Effort: Pulmonary effort is normal.     Breath sounds: Normal breath sounds.  Abdominal:     Palpations: Abdomen is soft.  Genitourinary:    Comments: Foley in place Musculoskeletal:        General: Normal range of motion.     Cervical back: Normal range of motion and neck supple.  Skin:    General: Skin is warm.  Neurological:     Mental Status: She is alert.     Comments: No new focal deficits  Psychiatric:        Mood and Affect: Mood normal.        Behavior: Behavior normal.     Data Reviewed:  Results are pending, will review when available.  Family Communication: Husband updated telephonically 3/19  Disposition: Status is: inpt  Planned Discharge Destination: Skilled nursing facility    Time spent: 35  minutes  Author: Desma Maxim, MD 05/18/2022 2:15 PM  For on call review [email or web address].

## 2022-05-18 NOTE — TOC Progression Note (Signed)
Transition of Care [facility]) - Progression Note    Patient Details  Name: Sherry [name] MRN: [id number] Date of Birth: Feb 24, 1931  [facility] CM/SW Contact  Beverly Sessions, RN Phone Number: 05/18/2022, 9:58 AM  Clinical Narrative:    Donella Stade with [facility] confirm patient can return, and will not need another 3 night stay         Expected Discharge Plan and Services                                               Social Determinants of Health (SDOH) Interventions [place]: No Food Insecurity (05/15/2022)  Housing: Low Risk  (05/15/2022)  Transportation Needs: No Transportation Needs (05/15/2022)  Utilities: Not At Risk (05/15/2022)  Tobacco Use: Low Risk  (05/16/2022)    Readmission Risk Interventions    01/07/2022    2:50 PM  Readmission Risk Prevention Plan  Post Dischage Appt Complete  Medication Screening Complete  Transportation Screening Complete

## 2022-05-18 NOTE — Consult Note (Signed)
NEURO HOSPITALIST CONSULT NOTE   Requestig physician: Dr. Si Raider  Reason for Consult: Acute left lateral medullary ischemic infarction  History obtained from:   Patient and Chart     HPI:                                                                                                                                          Sherry [name] is an 89 y.o. 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 She re-presented on 3/16 to [facility] with complaints of intermittent vertigo and headache. MRI was obtained today, revealing an acute left lateral medullary ischemic infarction.   Past Medical History:  Diagnosis Date   Actinic keratosis    Breast cancer ([place])    C1 cervical fracture ([place])    Hypertension    Osteoarthritis     Past Surgical History:  Procedure Laterality Date   BACK SURGERY     BIOPSY  05/03/2022   Procedure: BIOPSY;  Surgeon: Yetta Flock, MD;  Location: [facility] ENDOSCOPY;  Service: Gastroenterology;;   ESOPHAGOGASTRODUODENOSCOPY (EGD) WITH PROPOFOL N/A 05/03/2022   Procedure: ESOPHAGOGASTRODUODENOSCOPY (EGD) WITH PROPOFOL;  Surgeon: Yetta Flock, MD;  Location: [place];  Service: Gastroenterology;  Laterality: N/A;   HEMOSTASIS CONTROL  05/03/2022   Procedure: HEMOSTASIS CONTROL;  Surgeon: Yetta Flock, MD;  Location: [facility] ENDOSCOPY;  Service: Gastroenterology;;   IR INJECT/THERA/INC NEEDLE/CATH/PLC EPI/LUMB/SAC [facility]  04/29/2021   MOHS SURGERY      Family History  Problem Relation Age of Onset   Hypertension Father             Social History:  reports that she has never smoked. She has never been exposed to tobacco smoke. She has never used  smokeless tobacco. She reports current alcohol use. She reports that she does not use drugs.  Allergies  Allergen Reactions   Morphine     Other reaction(s): Other (See Comments) Apnea with large dose Respiratory Distress    Buprenorphine Hcl     Other reaction(s): Other (See Comments), Other (See Comments) Respiratory Distress Respiratory Distress    Codeine    Epinephrine     Other reaction(s): Other (See Comments) Severe anxiety; "makes my heart race" - would like to avoid during surgery   Morphine And Related Other (See Comments)    Respiratory Distress    Percocet [Oxycodone-Acetaminophen]     MEDICATIONS:  Prior to Admission:  Medications Prior to Admission  Medication Sig Dispense Refill Last Dose   acetaminophen (TYLENOL) 325 MG tablet Take 2 tablets (650 mg total) by mouth every 6 (six) hours as needed for headache.   05/14/2022 at 1925   aspirin EC 81 MG tablet Take 1 tablet (81 mg total) by mouth daily. Swallow whole. 30 tablet 12 05/15/2022 at 0900   atorvastatin (LIPITOR) 40 MG tablet Take 1 tablet (40 mg total) by mouth daily. 90 tablet 3 05/15/2022 at 0900   cholecalciferol (VITAMIN D) 25 MCG (1000 UNIT) tablet Take 2,000 Units by mouth daily.   05/15/2022 at 0900   Cyanocobalamin (B-12) 1000 MCG CAPS Take 2,000 mcg by mouth daily.   05/14/2022 at 0900   hydrALAZINE (APRESOLINE) 50 MG tablet Take 50 mg by mouth every 8 (eight) hours as needed.   PRN at PRN   lactose free nutrition (BOOST) LIQD Take 237 mLs by mouth 3 (three) times daily between meals.   05/15/2022 at 1000   metoCLOPramide (REGLAN) 5 MG tablet Take 5 mg by mouth every 6 (six) hours as needed for nausea.   05/15/2022 at 0813   ondansetron (ZOFRAN) 4 MG tablet Take one tablet by mouth every 6 hours as needed. Give one tablet by mouth every 8 hours for 2 days. Give one tablet by mouth twice daily  for 2 days.   05/14/2022 at 0700   pantoprazole (PROTONIX) 40 MG tablet Take 1 tablet (40 mg total) by mouth 2 (two) times daily for 21 days, THEN 1 tablet (40 mg total) daily. 127.059 tablet 0 05/15/2022 at 0800   senna-docusate (SENOKOT-S) 8.6-50 MG tablet Take 1 tablet by mouth 2 (two) times daily.   05/15/2022 at 0800   tamsulosin (FLOMAX) 0.4 MG CAPS capsule Take 1 capsule (0.4 mg total) by mouth daily. 30 capsule 0 05/14/2022 at 1600   tuberculin 5 UNIT/0.1ML injection Inject 0.1 mLs into the skin once.   05/12/2022 at 2147   Scheduled:  amLODipine  5 mg Oral Daily   aspirin EC  81 mg Oral Daily   atorvastatin  40 mg Oral Q supper   cefdinir  300 mg Oral Q12H   Chlorhexidine Gluconate Cloth  6 each Topical Daily   chlorproMAZINE  25 mg Oral BID   feeding supplement  1 Container Oral TID BM   pantoprazole  40 mg Oral BID   polyethylene glycol  17 g Oral Daily   senna-docusate  1 tablet Oral BID   sodium chloride flush  3 mL Intravenous Q12H   tamsulosin  0.4 mg Oral Daily   Continuous:   ROS:                                                                                                                                       As per HPI.    Blood pressure (!) 150/70, pulse 90, temperature  97.9 F (36.6 C), resp. rate 18, height 5\' 2"  (1.575 m), weight 56.7 kg, SpO2 99 %.   General Examination:                                                                                                       Physical Exam  HEENT-  [place]/AT    Lungs- Respirations unlabored Extremities- No edema  Neurological Examination Mental Status: Awake, alert and oriented. Speech fluent without any naming or comprehension deficits.  Cranial Nerves: II: Temporal visual fields intact with no extinction to DSS. PERRL  III,IV, VI: EOMI without ptosis. No nystagmus seen on far left or right gaze. No double vision endorsed.  V: Subjectively with equal sensation to temp bilaterally VII: Smile symmetric VIII:  Hearing intact to voice IX,X: Vocal tremor is noted.  XI: Symmetric XII: Midline tongue extension Motor: RUE and RLE 5/5 LUE and LLE 4/5 Sensory: Decreased FT sensation to LUE and LLE. No extinction to DSS.  Deep Tendon Reflexes: 2+ and symmetric bilateral brachioradialis and biceps. 2+ left patellar, 4+ right patellar (crossed adductor response on the left.  Cerebellar: Normal FNF on the right. Severe ataxia with FNF on the left.  Gait: Deferred   Lab Results: Basic Metabolic Panel: Recent Labs  Lab 05/12/22 0554 05/15/22 1404 05/16/22 0418 05/17/22 0434  NA 137 131* 135 135  K 3.9 3.6 3.8 3.3*  CL 106 104 102 103  CO2 23 22 24 22   GLUCOSE 101* 120* 86 102*  BUN 11 18 15 18   CREATININE 0.63 0.57 0.57 0.58  CALCIUM 8.5* 8.9 9.2 8.9    CBC: Recent Labs  Lab 05/12/22 0554 05/15/22 1404 05/16/22 0418 05/17/22 0434  WBC 8.0 7.8 7.0 7.2  NEUTROABS  --  5.0  --   --   HGB 9.3* 11.1* 11.2* 11.0*  HCT 28.0* 34.5* 34.1* 32.9*  MCV 90.9 92.7 91.2 90.1  PLT 447* 537* 460* 451*    Cardiac Enzymes: No results for input(s): "CKTOTAL", "CKMB", "CKMBINDEX", "TROPONINI" in the last 168 hours.  Lipid Panel: No results for input(s): "CHOL", "TRIG", "HDL", "CHOLHDL", "VLDL", "LDLCALC" in the last 168 hours.  Imaging: MR BRAIN WO CONTRAST  Result Date: 05/18/2022 CLINICAL DATA:  Provided history: Recent stroke. Ongoing vertigo. History of breast cancer. 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 EXAM: MRI HEAD WITHOUT CONTRAST TECHNIQUE: Multiplanar, multiecho pulse sequences of the brain and surrounding structures were obtained without intravenous contrast. COMPARISON:  Prior head CT examinations 05/15/2022 and earlier. Brain MRI 04/25/2022. FINDINGS: Brain: Mild generalized parenchymal atrophy. New from the prior brain MRI of 04/25/2022, there is a 4 mm acute infarct within the left dorsolateral aspect of the medulla (series 5, image 6). Known  moderately large late subacute left PICA territory infarct within the left cerebellar hemisphere. Redemonstrated small focus of chronic encephalomalacia/gliosis within the anterior right frontal lobe, which may be posttraumatic in etiology or may reflect a chronic infarct. Redemonstrated small chronic cortical infarct within the right occipital lobe. Background moderate multifocal T2 FLAIR hyperintense signal abnormality within the cerebral white  matter, nonspecific but compatible with chronic small vessel ischemic disease. Redemonstrated chronic lacunar infarcts within the left basal ganglia/internal capsule. Punctate chronic microhemorrhage within the left frontoparietal white matter. No evidence of an intracranial mass. No extra-axial fluid collection. No midline shift. Vascular: Signal abnormality within the intracranial left vertebral artery compatible with known vessel occlusion. Flow voids preserved elsewhere within the proximal large arterial vessels. 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 Incompletely assessed cervical spondylosis. Sinuses/Orbits: No mass or acute finding within the imaged orbits. Prior bilateral ocular lens replacement. Mucous retention cyst within a posterior right ethmoid air cell. Small mucous retention cyst within the left sphenoid sinus. Other: Right mastoid effusion. Trace fluid also present within the left mastoid air cells. Impression #1 will be called to the ordering clinician or representative by the Radiologist Assistant, and communication documented in the PACS or [employer]. IMPRESSION: 1. 4 mm acute infarct within the left dorsolateral aspect of the medulla, new from the prior brain MRI of 04/25/2022. 2. Known moderately large late subacute left PICA territory infarct within the left cerebellar hemisphere. 3. Redemonstrated small focus of chronic encephalomalacia/gliosis within the anterior right frontal lobe, which  may be posttraumatic in etiology or may reflect a chronic infarct. 4. Redemonstrated small chronic cortically-based infarct within the right occipital lobe. 5. Background parenchymal atrophy and chronic small vessel ischemic disease with chronic small-vessel infarcts, as described. 6. Signal abnormality within the intracranial left vertebral artery compatible with known vessel occlusion. 7. Known chronic nonunited type 2 dens fracture. 8. Paranasal sinus disease, as outlined. 9. Right mastoid effusion. Electronically Signed   By: Kellie Simmering D.O.   On: 05/18/2022 12:46     Assessment:  89 y.o. 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 She re-presented on 3/16 to [facility] with complaints of intermittent vertigo and headache. MRI was obtained today, revealing an acute left lateral medullary ischemic infarction.  - Exam reveals findings referable to the recent large left cerebellar stroke. There is left sided sensory deficit which is unexpected, as it would be expected to be on the right in the case of lateral medullary infarction.  - Imaging: - MRI brain from today: 4 mm acute infarct within the left dorsolateral aspect of the medulla, new from the prior brain MRI of 04/25/2022. Known moderately large late subacute left PICA territory infarct within the left cerebellar hemisphere. Redemonstrated small focus of chronic encephalomalacia/gliosis within the anterior right frontal lobe, which may be posttraumatic in etiology or may reflect a chronic infarct. Redemonstrated small chronic cortically-based infarct within the right occipital lobe. Background parenchymal atrophy and chronic small vessel ischemic disease with  chronic small-vessel infarcts. Signal abnormality within the intracranial left vertebral artery compatible with known vessel occlusion. Known chronic nonunited type 2 dens fracture.  - MRI obtained last admission: Acute infarct in the medial aspect of the left cerebellar hemisphere. 2. Additional region of increased signal on diffusion-weighted imaging in the medial aspect of the right occipital lobe, favored to represent an additional site of an acute to subacute infarct. Absent flow void in the V4 segment of the left vertebral artery. - CTA head and neck obtained last admission: Occluded Left Vertebral Artery in the V2 segment,  appears new since November and with reconstitution only at the vertebrobasilar junction. This could be secondary to vertebral artery dissection or atherosclerosis occlusion (see #2). Little atherosclerosis in the neck, but evidence of Advanced Intracranial Atherosclerosis with widespread Moderate And Severe 2nd and 3rd COW branch stenoses. No discrete branch occlusion. - TTE obtained during recent admission: LVEF 70-75% - Recent LDL 122 and HgbA1c: 5.6 - She has failed ASA monotherapy.  - At risk for aspiration given the new lateral medullary infarction.   Recommendations: - Add Plavix to ASA. Continue DAPT indefinitely - Can continue atorvastatin, but given her advanced age, the benefits may be outweighed by the risks. If she develops a subclinical statin-induced myopathy, her rate of recovery from her strokes could be significantly slowed.  - Speech re-evaluation. NPO until then.  - IVF for hydration - PT/OT - OOB to chair qd for goal of 3 hours in the morning and 3 hours in the late afternoon to prevent rapid deconditioning in this frail elderly female with recent strokes.  - BP management. Out of the permissive HTN time window.  - Rehabilitation Medicine consult   Electronically signed: Dr. Kerney Elbe 05/18/2022, 1:59 PM

## 2022-05-19 DIAGNOSIS — Z7189 Other specified counseling: Secondary | ICD-10-CM

## 2022-05-19 DIAGNOSIS — R112 Nausea with vomiting, unspecified: Secondary | ICD-10-CM | POA: Diagnosis not present

## 2022-05-19 DIAGNOSIS — R338 Other retention of urine: Secondary | ICD-10-CM

## 2022-05-19 DIAGNOSIS — I10 Essential (primary) hypertension: Secondary | ICD-10-CM | POA: Diagnosis not present

## 2022-05-19 DIAGNOSIS — N390 Urinary tract infection, site not specified: Secondary | ICD-10-CM | POA: Diagnosis not present

## 2022-05-19 DIAGNOSIS — K5900 Constipation, unspecified: Secondary | ICD-10-CM | POA: Diagnosis not present

## 2022-05-19 LAB — CBC
HCT: 35.2 % — ABNORMAL LOW (ref 36.0–46.0)
Hemoglobin: 11.5 g/dL — ABNORMAL LOW (ref 12.0–15.0)
MCH: 29.8 pg (ref 26.0–34.0)
MCHC: 32.7 g/dL (ref 30.0–36.0)
MCV: 91.2 fL (ref 80.0–100.0)
Platelets: 415 10*3/uL — ABNORMAL HIGH (ref 150–400)
RBC: 3.86 MIL/uL — ABNORMAL LOW (ref 3.87–5.11)
RDW: 13.5 % (ref 11.5–15.5)
WBC: 8.2 10*3/uL (ref 4.0–10.5)
nRBC: 0 % (ref 0.0–0.2)

## 2022-05-19 LAB — BASIC METABOLIC PANEL
Anion gap: 12 (ref 5–15)
BUN: 25 mg/dL — ABNORMAL HIGH (ref 8–23)
CO2: 23 mmol/L (ref 22–32)
Calcium: 9.2 mg/dL (ref 8.9–10.3)
Chloride: 103 mmol/L (ref 98–111)
Creatinine, Ser: 0.49 mg/dL (ref 0.44–1.00)
GFR, Estimated: >60 mL/min (ref >60)
Glucose, Bld: 152 mg/dL — ABNORMAL HIGH (ref 70–99)
Potassium: 3.7 mmol/L (ref 3.5–5.1)
Sodium: 138 mmol/L (ref 135–145)

## 2022-05-19 MED ORDER — CLOPIDOGREL BISULFATE 75 MG PO TABS
75.0000 mg | ORAL_TABLET | Freq: Every day | ORAL | Status: DC
  Administered 2022-05-19 – 2022-05-21 (×3): 75 mg via ORAL
  Filled 2022-05-19 (×3): qty 1

## 2022-05-19 NOTE — NC FL2 (Signed)
[place] LEVEL OF CARE FORM     IDENTIFICATION  Patient Name: Sherry [name] Birthdate: 1930-05-09 Sex: female Admission Date (Current Location): 05/15/2022  [facility] and [place] Number:  Engineering geologist and Address:         Provider Number: [id number]  Attending Physician Name and Address:  Hosie Poisson, MD  Relative Name and Phone Number:       Current Level of Care: Hospital Recommended Level of Care: [place] Prior Approval Number:    Date Approved/Denied:   PASRR Number: [id number]  Discharge Plan: SNF    Current Diagnoses: Patient Active Problem List   Diagnosis Date Noted   Complicated UTI (urinary tract infection) 05/15/2022   Nausea and vomiting 05/15/2022   Constipation 05/15/2022   Antiplatelet or antithrombotic long-term use 05/04/2022   Peptic ulcer 05/03/2022   Upper GI bleed 05/02/2022   Anemia, posthemorrhagic, acute 05/02/2022   Acute urinary retention 05/02/2022   Vertigo following cerebrovascular accident 05/02/2022   Leukocytosis 05/02/2022   Lactic acidosis 05/02/2022   Hypertensive emergency 04/26/2022   Cerebellar stroke ([place]) 04/26/2022   Left-sided cerebrovascular accident (CVA) ([place]) 04/25/2022   CVA (cerebral vascular accident) ([place]) 04/25/2022   UTI (urinary tract infection) [id number]   Acute metabolic encephalopathy 0000000   Encephalopathy 01/04/2022   Hypertensive urgency 01/03/2022   Back pain 05/29/2021   Left hip pain 05/28/2021   Hypokalemia 05/28/2021   Orthostatic hypotension 05/25/2021   Left sided sciatica 05/24/2021   Duodenitis 05/18/2021   Anemia of chronic disease 05/16/2021   Abdominal pain 05/15/2021   Thrombocytosis 05/15/2021   Essential hypertension 04/30/2021   Spinal stenosis at L4-L5 level 04/27/2021   Intractable low back pain 04/27/2021   Low back pain 04/26/2021   Closed C1 fracture ([place]) 02/12/2020   Neck fracture ([place]) 02/11/2020   Chest pain with  moderate risk for cardiac etiology 05/05/2017   Shortness of breath 05/05/2017   Hyperlipidemia 09/25/2016   Neuropathy 09/25/2016   Statin intolerance 09/25/2016   Anxiety 09/25/2016   Palpitations 09/25/2016    Orientation RESPIRATION BLADDER Height & Weight     Self, Time, Situation, Place  Normal Indwelling catheter Weight: 56.7 kg Height:  5\' 2"  (157.5 cm)  BEHAVIORAL SYMPTOMS/MOOD NEUROLOGICAL BOWEL NUTRITION STATUS      Continent Diet (Heart Healthy)  AMBULATORY STATUS COMMUNICATION OF NEEDS Skin   Extensive Assist Verbally Normal                       Personal Care Assistance Level of Assistance              Functional Limitations Info             SPECIAL CARE FACTORS FREQUENCY  PT (By licensed PT), OT (By licensed OT)                    Contractures Contractures Info: Not present    Additional Factors Info  Code Status, Allergies Code Status Info: Full Allergies Info: Morphine, Buprenorphine Hcl, Codeine, Epinephrine, Morphine And Related, Percocet (Oxycodone-acetaminophen)           Current Medications (05/19/2022):  This is the current hospital active medication list Current Facility-Administered Medications  Medication Dose Route Frequency Provider Last Rate Last Admin   acetaminophen (TYLENOL) tablet 650 mg  650 mg Oral Q6H PRN Jose Persia, MD   650 mg at 05/18/22 0953   Or   acetaminophen (TYLENOL) suppository 650 mg  650 mg Rectal Q6H PRN Jose Persia, MD       amLODipine (NORVASC) tablet 5 mg  5 mg Oral Daily Jose Persia, MD   5 mg at 05/19/22 1054   aspirin EC tablet 81 mg  81 mg Oral Daily Jose Persia, MD   81 mg at 05/19/22 1054   atorvastatin (LIPITOR) tablet 40 mg  40 mg Oral Q supper Jose Persia, MD   40 mg at 05/18/22 1655   cefdinir (OMNICEF) capsule 300 mg  300 mg Oral Q12H Wouk, Ailene Rud, MD   300 mg at 05/19/22 1055   Chlorhexidine Gluconate Cloth 2 % PADS 6 each  6 each Topical Daily Oran Rein, MD   6 each at 05/19/22 1056   chlorproMAZINE (THORAZINE) tablet 25 mg  25 mg Oral BID Jose Persia, MD   25 mg at 05/19/22 1055   clopidogrel (PLAVIX) tablet 75 mg  75 mg Oral Daily Hosie Poisson, MD       feeding supplement (BOOST / RESOURCE BREEZE) liquid 1 Container  1 Container Oral TID BM Jose Persia, MD   1 Container at 05/19/22 1056   Oral care mouth rinse  15 mL Mouth Rinse PRN Oran Rein, MD       pantoprazole (PROTONIX) EC tablet 40 mg  40 mg Oral BID Jose Persia, MD   40 mg at 05/19/22 1054   polyethylene glycol (MIRALAX / GLYCOLAX) packet 17 g  17 g Oral Daily Jose Persia, MD   17 g at 05/19/22 1054   senna-docusate (Senokot-S) tablet 1 tablet  1 tablet Oral BID Jose Persia, MD   1 tablet at 05/18/22 2129   sodium chloride flush (NS) 0.9 % injection 3 mL  3 mL Intravenous Q12H Jose Persia, MD   3 mL at 05/19/22 1056   tamsulosin (FLOMAX) capsule 0.4 mg  0.4 mg Oral Daily Gwynne Edinger, MD   0.4 mg at 05/19/22 1054     Discharge Medications: Please see discharge summary for a list of discharge medications.  Relevant Imaging Results:  Relevant Lab Results:   Additional Information SS#: [id number]  Beverly Sessions, RN

## 2022-05-19 NOTE — Progress Notes (Signed)
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 She presents with worsening dizziness and headache. Found to have complicated UTI. MRI brain on 05/18/22: 4 mm acute infarct within the left dorsolateral aspect of the medulla, new from the prior brain MRI of 04/25/2022.   OT comments  Patient received sitting in recliner and agreeable to OT. OT instructed pt in gaze stabilization/smooth pursuits exercises this date (see details below). Pt endorsed 5/10 dizziness at rest and 10/10 dizziness at one point during visual exercises. Pt then engaged in seated grooming tasks. She deferred further ADL tasks/mobility at this time. Pt requesting to stay sitting up in recliner for lunch. Pt left with all needs in reach. Pt is making progress toward goal completion. D/C recommendation remains appropriate. OT will continue to follow acutely.    Recommendations for follow up therapy are one component of a multi-disciplinary discharge planning process, led by the attending physician.  Recommendations may be updated based on patient status, additional functional criteria and insurance authorization.    Follow Up Recommendations  Skilled nursing-short term rehab (<3 hours/day)     Assistance Recommended at Discharge Frequent or constant Supervision/Assistance  Patient can return home with the following  A lot of help with bathing/dressing/bathroom;Assistance with cooking/housework;Direct supervision/assist for medications management;Direct supervision/assist for financial management;Assist for transportation;Help with stairs or ramp for entrance;A lot of help with walking and/or transfers   Equipment  Recommendations  Other (comment) (defer to next venue of care)    Recommendations for Other Services      Precautions / Restrictions Precautions Precautions: Fall Precaution Comments: dizziness with positional changes Restrictions Weight Bearing Restrictions: No       Mobility Bed Mobility Overal bed mobility: Needs Assistance             General bed mobility comments: NT, pt received/left in recliner    Transfers Overall transfer level: Needs assistance                 General transfer comment: pt deferred getting back in bed stating "I want to sit up as long as I can...my doctor recommended it"     Balance Overall balance assessment: Needs assistance Sitting-balance support: Feet supported, Bilateral upper extremity supported Sitting balance-Leahy Scale: Fair         ADL either performed or assessed with clinical judgement   ADL Overall ADL's : Needs assistance/impaired Eating/Feeding: Set up;Sitting   Grooming: Set up;Sitting;Wash/dry face;Oral care;Minimal assistance Grooming Details (indicate cue type and reason): assist to open toothpaste & place on toothbrush, pt stating "I can't use this hand" re: decreased strength/FMC of L hand          Extremity/Trunk Assessment              Vision Patient Visual Report: Nausea/blurring vision with head movement Eye Alignment: Within Functional Limits Ocular Range of Motion: Within Functional Limits Alignment/Gaze Preference: Within Defined Limits Additional Comments: OT instructed pt in gaze stabilization exercises/smooth pursuits this date. Gaze stabilization: able to fixate on target (therapist's finger) x10 seconds 1st trial and x5 seconds 2nd trial. At first, pt unable to complete visual pursuits toward R lateral visual field, immediately experienced onset of dizziness and had to close eyes. Decreased ability to visually  track toward L inferior field. Able to track toward R lateral side of visual  field on 2nd attempt when OT moving stimulus very slowly.   Perception     Praxis      Cognition Arousal/Alertness: Awake/alert Behavior During Therapy: WFL for tasks assessed/performed Overall Cognitive Status: No family/caregiver present to determine baseline cognitive functioning                         Following Commands: Follows one step commands with increased time Safety/Judgement: Decreased awareness of safety, Decreased awareness of deficits   Problem Solving: Slow processing, Decreased initiation, Difficulty sequencing, Requires verbal cues, Requires tactile cues          Exercises Other Exercises Other Exercises: Education provided re: visual exercises, sitting up in recliner for meals, maintaining upright posture to build up tolerance and hopefully reduce dizziness with position changes    Shoulder Instructions       General Comments      Pertinent Vitals/ Pain       Pain Assessment Pain Assessment: No/denies pain  Home Living            Prior Functioning/Environment              Frequency  Min 2X/week        Progress Toward Goals  OT Goals(current goals can now be found in the care plan section)  Progress towards OT goals: Progressing toward goals  Acute Rehab OT Goals Patient Stated Goal: feel better OT Goal Formulation: With patient Time For Goal Achievement: 05/31/22 Potential to Achieve Goals: [place] Discharge plan remains appropriate;Frequency remains appropriate    Co-evaluation                 AM-PAC OT "6 Clicks" Daily Activity     Outcome Measure   Help from another person eating meals?: A Little Help from another person taking care of personal grooming?: A Little Help from another person toileting, which includes using toliet, bedpan, or urinal?: A Lot Help from another person bathing (including washing, rinsing, drying)?: A Lot Help from another person to put on and taking off regular upper body  clothing?: A Lot Help from another person to put on and taking off regular lower body clothing?: A Lot 6 Click Score: 14    End of Session    OT Visit Diagnosis: Other abnormalities of gait and mobility (R26.89);Muscle weakness (generalized) (M62.81);Dizziness and giddiness (R42);Other symptoms and signs involving the nervous system (R29.898)   Activity Tolerance Patient tolerated treatment well   Patient Left in chair;with call bell/phone within reach;with chair alarm set   Nurse Communication Mobility status        Time: 1115-1130 OT Time Calculation (min): 15 min  Charges: OT General Charges $OT Visit: 1 Visit OT Treatments $Self Care/Home Management : 8-22 mins  [facility] MS, OTR/L ascom [phone]  05/19/22, 2:31 PM

## 2022-05-19 NOTE — Progress Notes (Signed)
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 She presents with worsening dizziness and headache. Found to have complicated UTI.    PT Comments    Patient agreeable to PT. She reports 7/10 dizziness at rest, progressing slightly to 8/10 with position changes. Increased activity tolerance this session, however continued limitations by generalized weakness, fatigue, and dizziness.  Patient required moderate assistance for stand pivot transfer to the chair. Encouraged patient to sit up in chair as tolerated for upright conditioning. Recommend to continue PT to maximize independence and facilitate return to prior level of function.     Recommendations for follow up therapy are one component of a multi-disciplinary discharge planning process, led by the attending physician.  Recommendations may be updated based on patient status, additional functional criteria and insurance authorization.  Follow Up Recommendations  Skilled nursing-short term rehab (<3 hours/day) Can patient physically be transported by private vehicle: No   Assistance Recommended at Discharge Frequent or constant Supervision/Assistance  Patient can return home with the following A lot of help with walking and/or transfers;A lot of help with bathing/dressing/bathroom;Assist for transportation;Help with stairs or ramp for entrance;Assistance with cooking/housework   Equipment Recommendations   (to be determined at next level of care)    Recommendations for Other Services       Precautions / Restrictions Precautions Precautions: Fall Precaution Comments: dizziness with positional changes Restrictions Weight Bearing  Restrictions: No     Mobility  Bed Mobility Overal bed mobility: Needs Assistance Bed Mobility: Supine to Sit     Supine to sit: Mod assist     General bed mobility comments: assistance mostly for trunk support so sit upright. verbal cues for sequencing and task initiation. patient reports 7/10 dizziness prior to moving and 7-8/10 after sitting upright    Transfers Overall transfer level: Needs assistance   Transfers: Bed to chair/wheelchair/BSC   Stand pivot transfers: Mod assist         General transfer comment: verbal cues for anterior weight shifting and task initiation. lifting and lowering assistance provided.    Ambulation/Gait                   Stairs             Wheelchair Mobility    Modified Rankin (Stroke Patients Only)       Balance Overall balance assessment: Needs assistance Sitting-balance support: Feet supported Sitting balance-Leahy Scale:  (poor initiall progressing to fair) Sitting balance - Comments: initial steadying assistance required. improved sitting balance with increased sitting time, progressing to close stand by assistance                                    Cognition Arousal/Alertness: Awake/alert Behavior During Therapy: WFL for tasks assessed/performed Overall Cognitive Status: No family/caregiver present to determine baseline cognitive functioning                         Following Commands: Follows one step commands with increased time Safety/Judgement: Decreased awareness of safety, Decreased awareness of deficits   Problem Solving: Slow processing, Decreased initiation, Difficulty sequencing, Requires verbal cues, Requires tactile  cues          Exercises      General Comments General comments (skin integrity, edema, etc.): encouraged patient to keep her eyes opened and focus on object as patient prefers to keep her eyes closed      Pertinent Vitals/Pain Pain Assessment Pain  Assessment: No/denies pain    Home Living                          Prior Function            PT Goals (current goals can now be found in the care plan section) Acute Rehab PT Goals Patient Stated Goal: to feel better PT Goal Formulation: With patient Time For Goal Achievement: 05/31/22 Potential to Achieve Goals: Fair Progress towards PT goals: Progressing toward goals    Frequency    Min 2X/week      PT Plan Current plan remains appropriate    Co-evaluation              AM-PAC PT "6 Clicks" Mobility   Outcome Measure  Help needed turning from your back to your side while in a flat bed without using bedrails?: A Little Help needed moving from lying on your back to sitting on the side of a flat bed without using bedrails?: A Lot Help needed moving to and from a bed to a chair (including a wheelchair)?: A Lot Help needed standing up from a chair using your arms (e.g., wheelchair or bedside chair)?: A Lot Help needed to walk in hospital room?: A Lot Help needed climbing 3-5 steps with a railing? : A Lot 6 Click Score: 13    End of Session   Activity Tolerance: Patient limited by fatigue Patient left: in chair;with call bell/phone within reach;with family/visitor present;with nursing/sitter in room (chair alarm pad in place with no green box in room- nurse aware. nurse and spouse present at end of session) Nurse Communication: Mobility status PT Visit Diagnosis: Muscle weakness (generalized) (M62.81);Difficulty in walking, not elsewhere classified (R26.2)     Time: [id number] PT Time Calculation (min) (ACUTE ONLY): 25 min  Charges:  $Therapeutic Activity: 23-37 mins                    Minna Merritts, PT, MPT    Percell Locus 05/19/2022, 9:08 AM

## 2022-05-19 NOTE — Plan of Care (Signed)
  Problem: Nutrition: Goal: Risk of aspiration will decrease Outcome: Progressing   Problem: Activity: Goal: Risk for activity intolerance will decrease Outcome: Progressing   Problem: Elimination: Goal: Will not experience complications related to urinary retention Outcome: Progressing   Problem: Pain Managment: Goal: General experience of comfort will improve Outcome: Progressing

## 2022-05-19 NOTE — Progress Notes (Signed)
[facility]                                                                               Sherry [name], is a 89 y.o. female, DOB - 1930-04-09, [id number] Admit date - 05/15/2022    Outpatient Primary MD for the patient is Rusty Aus, MD  LOS - 3  days    Brief summary   Sherry [name] is a 89 y.o. 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   Sherry [name] states that she has only been at [facility] for approximately 24 hours before she was sent to the ED due to increased nausea with several episodes of vomiting.  In addition, her previously present dizziness and headache seem to have worsened in severity. CT of the head was obtained that did not show any acute abnormalities or changes. CT of the abdomen was obtained with final reading still pending. Chest x-ray was obtained with no acute cardiopulmonary disease. [facility] contacted for admission due to complicated UTI.  MRI Brain showed  4 mm acute infarct within the left dorsolateral aspect of the medulla, new from the prior brain MRI of 04/25/2022. Known moderately large late subacute left PICA territory infarct within the left cerebellar hemisphere. Re demonstrated small focus of chronic encephalomalacia/gliosis within the anterior right frontal lobe,. Neurology consulted and recommendations given.  Therapy eval recommending SNF.    Assessment & Plan    Assessment and Plan: * Complicated UTI (urinary tract infection) Patient is presenting with worsening headache and dizziness, in addition to new nausea and vomiting with urinalysis concerning for UTI in the setting of Foley catheter that was exchanged on 05/09/2022.  Foley was  initially placed in the setting of urinary retention secondary to CVA. CT of the abdomen demonstrates bladder wall thickening with mucosal and enhancement. Continue with oral cefdinir, foley was exchanged and continue with flomax.   Nausea and vomiting Patient endorses worsening nausea and vomiting over the past 24 hours.  I wonder if may be due to underlying infection, versus part of the vertigo she has been experiencing post CVA.  Given she has been on Thorazine on recent admission with improvement, will restart but at decreased frequency due to QT prolongation  Appears to have much improved.    Vertigo following cerebrovascular accident Patient notes that her symptoms of vertigo including headache and dizziness have worsened since leaving the hospital.  Symptomatic management with thorazine 25 mg BID Therapy eval recommending SNF.    Left-sided cerebrovascular accident (CVA) ([place]) MRI Brain showed  4 mm acute infarct within the left dorsolateral aspect of the medulla, new from the prior brain MRI of 04/25/2022. Neurology consulted and recommendations given.  Aspirin and plavix along with lipitor 40 mg daily.   Essential hypertension Well controlled.  Continue with norvasc   Constipation Significant stool burden seen on CT imaging. Continue with senna and miralax.   Hypokalemia:  Replaced.     Hyponatremia:  Resolved.    Mild normocytic anemia and thrombocytosis   GERD: Resume protonix 40 mg BID.    Estimated body mass index is 22.86 kg/m as calculated from the following:   Height as of this encounter: 5\' 2"  (1.575 m).   Weight as of this encounter: 56.7 kg.  Code Status: full code.  DVT Prophylaxis:  SCDs Start: 05/15/22 1657   Level of Care: Level of care: Med-Surg Family Communication: spouse at bedside.   Disposition Plan:     Remains inpatient appropriate:  SNF placement.   Procedures:  MRI brain.   Consultants:   Neurology.  Antimicrobials:    Anti-infectives (From admission, onward)    Start     Dose/Rate Route Frequency Ordered Stop   05/18/22 1000  cefdinir (OMNICEF) capsule 300 mg        300 mg Oral Every 12 hours 05/17/22 1416     05/17/22 2000  ceFEPIme (MAXIPIME) 2 g in sodium chloride 0.9 % 100 mL IVPB  Status:  Discontinued        2 g 200 mL/hr over 30 Minutes Intravenous Every 24 hours 05/16/22 0750 05/17/22 0933   05/17/22 1030  cefTRIAXone (ROCEPHIN) 1 g in sodium chloride 0.9 % 100 mL IVPB  Status:  Discontinued        1 g 200 mL/hr over 30 Minutes Intravenous Every 24 hours 05/17/22 0933 05/17/22 1416   05/15/22 1945  ceFEPIme (MAXIPIME) 2 g in sodium chloride 0.9 % 100 mL IVPB  Status:  Discontinued        2 g 200 mL/hr over 30 Minutes Intravenous Every 12 hours 05/15/22 1857 05/16/22 0750   05/15/22 1530  cefTRIAXone (ROCEPHIN) 1 g in sodium chloride 0.9 % 100 mL IVPB        1 g 200 mL/hr over 30 Minutes Intravenous  Once 05/15/22 1516 05/15/22 1600        Medications  Scheduled Meds:  amLODipine  5 mg Oral Daily   aspirin EC  81 mg Oral Daily   atorvastatin  40 mg Oral Q supper   cefdinir  300 mg Oral Q12H   Chlorhexidine Gluconate Cloth  6 each Topical Daily   chlorproMAZINE  25 mg Oral BID   feeding supplement  1 Container Oral TID BM   pantoprazole  40 mg Oral BID   polyethylene glycol  17 g Oral Daily   senna-docusate  1 tablet Oral BID   sodium chloride flush  3 mL Intravenous Q12H   tamsulosin  0.4 mg Oral Daily   Continuous Infusions: PRN Meds:.acetaminophen **OR** acetaminophen, mouth rinse    Subjective:   Sherry [name] was seen and examined today.  No new complaints.   Objective:   Vitals:   05/18/22 0811 05/18/22 1540 05/18/22 2006 05/19/22 0417  BP: (!) 150/70 (!) 148/68 (!) 144/63 137/88  Pulse: 90 93 89 90  Resp: 18 18 20 18   Temp: 97.9 F (36.6 C) 98.3 F (36.8 C) 98.4 F (36.9 C) 98.6 F (37 C)  TempSrc:   Oral   SpO2: 99% 98% 97% 98%  Weight:      Height:         Intake/Output Summary (Last 24 hours) at 05/19/2022 1132 Last data filed at 05/19/2022 1036 Gross per 24 hour  Intake 480 ml  Output 1650 ml  Net -1170 ml   Filed Weights   05/15/22 2211  Weight: 56.7 kg     Exam  General exam: Appears calm and comfortable  Respiratory system: Clear to auscultation. Respiratory effort normal. Cardiovascular system: S1 & S2 heard, RRR. No JVD,  Gastrointestinal system: Abdomen is nondistended, soft and nontender.  Central nervous system: Alert and oriented. Persistent dizziness.  Extremities: no pedal edema.  Skin: No rashes, lesions or ulcers Psychiatry: mood is appropriate.    Data Reviewed:  I have personally reviewed following labs and imaging studies   CBC Lab Results  Component Value Date   WBC 8.2 05/19/2022   RBC 3.86 (L) 05/19/2022   HGB 11.5 (L) 05/19/2022   HCT 35.2 (L) 05/19/2022   MCV 91.2 05/19/2022   MCH 29.8 05/19/2022   PLT 415 (H) 05/19/2022   MCHC 32.7 05/19/2022   RDW 13.5 05/19/2022   LYMPHSABS 2.1 05/15/2022   MONOABS 0.5 05/15/2022   EOSABS 0.1 05/15/2022   BASOSABS 0.1 0000000     Last metabolic panel Lab Results  Component Value Date   NA 138 05/19/2022   K 3.7 05/19/2022   CL 103 05/19/2022   CO2 23 05/19/2022   BUN 25 (H) 05/19/2022   CREATININE 0.49 05/19/2022   GLUCOSE 152 (H) 05/19/2022   GFRNONAA >60 05/19/2022   GFRAA >60 08/15/2017   CALCIUM 9.2 05/19/2022   PHOS 3.0 04/26/2022   PROT 6.5 05/15/2022   ALBUMIN 3.0 (L) 05/15/2022   BILITOT 0.5 05/15/2022   ALKPHOS 62 05/15/2022   AST 23 05/15/2022   ALT 15 05/15/2022   ANIONGAP 12 05/19/2022    CBG (last 3)  No results for input(s): "GLUCAP" in the last 72 hours.    Coagulation Profile: No results for input(s): "INR", "PROTIME" in the last 168 hours.   Radiology Studies: MR BRAIN WO CONTRAST  Result Date: 05/18/2022 CLINICAL DATA:  Provided history: Recent stroke. Ongoing vertigo. History of breast cancer. Recent  left cerebellar infarct secondary to vertebral artery occlusion. EXAM: MRI HEAD WITHOUT CONTRAST TECHNIQUE: Multiplanar, multiecho pulse sequences of the brain and surrounding structures were obtained without intravenous contrast. COMPARISON:  Prior head CT examinations 05/15/2022 and earlier. Brain MRI 04/25/2022. FINDINGS: Brain: Mild generalized parenchymal atrophy. New from the prior brain MRI of 04/25/2022, there is a 4 mm acute infarct within the left dorsolateral aspect of the medulla (series 5, image 6). Known moderately large late subacute left PICA territory infarct within the left cerebellar hemisphere. Redemonstrated small focus of chronic encephalomalacia/gliosis within the anterior right frontal lobe, which may be posttraumatic in etiology or may reflect a chronic infarct. Redemonstrated small chronic cortical infarct within the right occipital lobe. Background moderate multifocal T2 FLAIR hyperintense signal abnormality within the cerebral white matter, nonspecific but compatible with chronic small vessel ischemic disease. Redemonstrated chronic lacunar infarcts within the left basal ganglia/internal capsule. Punctate chronic microhemorrhage within the left frontoparietal white matter. No evidence of an intracranial mass. No extra-axial fluid collection. No midline shift. Vascular: Signal abnormality within the intracranial left vertebral artery compatible with known vessel occlusion. Flow voids preserved elsewhere within the proximal large arterial vessels. 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 Incompletely assessed cervical spondylosis. Sinuses/Orbits: No mass or acute finding within the imaged orbits. Prior bilateral ocular lens replacement. Mucous retention cyst within a posterior right ethmoid air cell. Small mucous retention cyst within the left sphenoid sinus. Other: Right mastoid effusion. Trace fluid also present within the left mastoid  air cells. Impression #1 will be called to the ordering clinician or representative by the Radiologist Assistant, and communication documented in the PACS or [facility]  Dashboard. IMPRESSION: 1. 4 mm acute infarct within the left dorsolateral aspect of the medulla, new from the prior brain MRI of 04/25/2022. 2. Known moderately large late subacute left PICA territory infarct within the left cerebellar hemisphere. 3. Redemonstrated small focus of chronic encephalomalacia/gliosis within the anterior right frontal lobe, which may be posttraumatic in etiology or may reflect a chronic infarct. 4. Redemonstrated small chronic cortically-based infarct within the right occipital lobe. 5. Background parenchymal atrophy and chronic small vessel ischemic disease with chronic small-vessel infarcts, as described. 6. Signal abnormality within the intracranial left vertebral artery compatible with known vessel occlusion. 7. Known chronic nonunited type 2 dens fracture. 8. Paranasal sinus disease, as outlined. 9. Right mastoid effusion. Electronically Signed   By: Kellie Simmering D.O.   On: 05/18/2022 12:46       Hosie Poisson M.D. [facility] 05/19/2022, 11:32 AM  Available via Epic secure chat 7am-7pm After 7 pm, please refer to night coverage provider listed on amion.

## 2022-05-19 NOTE — Consult Note (Addendum)
Consultation Note Date: 05/19/2022   Patient Name: Sherry [name]  DOB: 1930/12/08  MRN: [id number]  Age / Sex: 89 y.o., female  PCP: Sherry Aus, MD Referring Physician: Hosie Poisson, MD  Reason for Consultation: Establishing goals of care  HPI/Patient Profile: Sherry [name] is a 89 y.o. 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   [name] states that she has only been at [facility] for approximately 24 hours before she was sent to the ED due to increased nausea with several episodes of vomiting.  In addition, her previously present dizziness and headache seem to have worsened in severity.  She is uncertain if her emesis was bright red or black as she did not look.  She denies any known fevers or chills, chest pain, shortness of breath or abdominal pain.  She endorses constipation.  Notes that she does not have urinary symptoms given her Foley catheter is still in.  Clinical Assessment and Goals of Care: Notes and labs reviewed.  In to see patient.  Patient is resting in bedside chair, with husband at bedside.  She complains of a headache.  Patient is able to tell me that she has been married to her husband for many years, husband states 57 years.  She is able to tell me that she has 1 child.  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  Since then she is enjoyed playing bridge and golf.   We discussed her diagnosis, prognosis, GOC, EOL wishes disposition and options.  Created space and opportunity for patient  to explore thoughts and feelings regarding current  medical information.   A detailed discussion was had today regarding advanced directives.  Concepts specific to code status, artifical feeding and hydration, IV antibiotics and rehospitalization were discussed.  The difference between an aggressive medical intervention path and a comfort care path was discussed.  Values and goals of care important to patient and family were attempted to be elicited.  Discussed limitations of medical interventions to prolong quality of life in some situations and discussed the concept of human mortality.  Patient's husband called their son Sherry [name] to join conversation on the phone.  They advised that advance directives have already been completed and they do not want "heroics".  Clarified that they would not want intubation or CPR.  They would not want a feeding tube placed.  They would like to continue to treat the treatable, but quality of life is very important to them.  I completed a MOST form today a with patient and husband after speaking with son, and the signed original was placed in the chart.  The form was signed by the patient's husband at her request as she is not able to sign.  The form was scanned and  sent to medical records for it to be uploaded under ACP tab in [place]. A photocopy was also placed in the chart to be scanned into EMR. The patient outlined their wishes for the following treatment decisions:  Cardiopulmonary Resuscitation: Do Not Attempt Resuscitation (DNR/No CPR)  Medical Interventions: Limited Additional Interventions: Use medical treatment, IV fluids and cardiac monitoring as indicated, DO NOT USE intubation or mechanical ventilation. May consider use of less invasive airway support such as BiPAP or CPAP. Also provide comfort measures. Transfer to the hospital if indicated. Avoid intensive care.   Antibiotics: Antibiotics if indicated  IV Fluids: IV fluids for a defined trial period  Feeding Tube: No feeding tube       SUMMARY OF  RECOMMENDATIONS   DNR/DNI.  No feeding tube.  Could consider Lyrica for patient's headaches secondary to CVA.  Family would like to speak with neurology about resuming Plavix.  Message sent to neurology.   Prognosis:  Unable to determine       Primary Diagnoses: Present on Admission:  Complicated UTI (urinary tract infection)  Essential hypertension  Left-sided cerebrovascular accident (CVA) ([place])  Acute urinary retention   I have reviewed the medical record, interviewed the patient and family, and examined the patient. The following aspects are pertinent.  Past Medical History:  Diagnosis Date   Actinic keratosis    Breast cancer ([place])    C1 cervical fracture ([facility])    Hypertension    Osteoarthritis    Social History   Socioeconomic History   Marital status: Married    Spouse name: Not on file   Number of children: Not on file   Years of education: Not on file   Highest education level: Not on file  Occupational History   Not on file  Tobacco Use   Smoking status: Never    Passive exposure: Never   Smokeless tobacco: Never  Vaping Use   Vaping Use: Never used  Substance and Sexual Activity   Alcohol use: Yes    Comment: Occassionally   Drug use: No   Sexual activity: Not on file  Other Topics Concern   Not on file  Social History Narrative   Not on file   Social Determinants of Health   Financial Resource Strain: Not on file  Food Insecurity: No Food Insecurity (05/15/2022)   Hunger Vital Sign    Worried About Running Out of Food in the Last Year: Never true    Ran Out of Food in the Last Year: Never true  Transportation Needs: No Transportation Needs (05/15/2022)   PRAPARE - Hydrologist (Medical): No    Lack of Transportation (Non-Medical): No  Physical Activity: Not on file  Stress: Not on file  Social Connections: Not on file   Family History  Problem Relation Age of Onset   Hypertension Father    Scheduled  Meds:  amLODipine  5 mg Oral Daily   aspirin EC  81 mg Oral Daily   atorvastatin  40 mg Oral Q supper   cefdinir  300 mg Oral Q12H   Chlorhexidine Gluconate Cloth  6 each Topical Daily   chlorproMAZINE  25 mg Oral BID   clopidogrel  75 mg Oral Daily   feeding supplement  1 Container Oral TID BM   pantoprazole  40 mg Oral BID   polyethylene glycol  17 g Oral Daily   senna-docusate  1 tablet Oral BID   sodium chloride flush  3 mL Intravenous  Q12H   tamsulosin  0.4 mg Oral Daily   Continuous Infusions: PRN Meds:.acetaminophen **OR** acetaminophen, mouth rinse Medications Prior to Admission:  Prior to Admission medications   Medication Sig Start Date End Date Taking? Authorizing Provider  acetaminophen (TYLENOL) 325 MG tablet Take 2 tablets (650 mg total) by mouth every 6 (six) hours as needed for headache. 05/12/22  Yes Annita Brod, MD  aspirin EC 81 MG tablet Take 1 tablet (81 mg total) by mouth daily. Swallow whole. 05/13/22  Yes Annita Brod, MD  atorvastatin (LIPITOR) 40 MG tablet Take 1 tablet (40 mg total) by mouth daily. 05/13/22  Yes Annita Brod, MD  cholecalciferol (VITAMIN D) 25 MCG (1000 UNIT) tablet Take 2,000 Units by mouth daily.   Yes [provider]  Cyanocobalamin (B-12) 1000 MCG CAPS Take 2,000 mcg by mouth daily.   Yes [provider]  hydrALAZINE (APRESOLINE) 50 MG tablet Take 50 mg by mouth every 8 (eight) hours as needed.   Yes [provider]  lactose free nutrition (BOOST) LIQD Take 237 mLs by mouth 3 (three) times daily between meals.   Yes [provider]  metoCLOPramide (REGLAN) 5 MG tablet Take 5 mg by mouth every 6 (six) hours as needed for nausea.   Yes [provider]  ondansetron (ZOFRAN) 4 MG tablet Take one tablet by mouth every 6 hours as needed. Give one tablet by mouth every 8 hours for 2 days. Give one tablet by mouth twice daily for 2 days.   Yes [provider]  pantoprazole  (PROTONIX) 40 MG tablet Take 1 tablet (40 mg total) by mouth 2 (two) times daily for 21 days, THEN 1 tablet (40 mg total) daily. 05/12/22 07/02/22 Yes Annita Brod, MD  senna-docusate (SENOKOT-S) 8.6-50 MG tablet Take 1 tablet by mouth 2 (two) times daily. 05/12/22  Yes Annita Brod, MD  tamsulosin (FLOMAX) 0.4 MG CAPS capsule Take 1 capsule (0.4 mg total) by mouth daily. 05/12/22  Yes Annita Brod, MD  tuberculin 5 UNIT/0.1ML injection Inject 0.1 mLs into the skin once.   Yes [provider]   Allergies  Allergen Reactions   Morphine     Other reaction(s): Other (See Comments) Apnea with large dose Respiratory Distress    Buprenorphine Hcl     Other reaction(s): Other (See Comments), Other (See Comments) Respiratory Distress Respiratory Distress    Codeine    Epinephrine     Other reaction(s): Other (See Comments) Severe anxiety; "makes my heart race" - would like to avoid during surgery   Morphine And Related Other (See Comments)    Respiratory Distress    Percocet [Oxycodone-Acetaminophen]    Review of Systems  Neurological:  Positive for headaches.    Physical Exam Pulmonary:     Effort: Pulmonary effort is normal.  Neurological:     Mental Status: She is alert.     Vital Signs: BP 137/88 (BP Location: Right Arm)   Pulse 90   Temp 98.6 F (37 C)   Resp 18   Ht 5\' 2"  (1.575 m)   Wt 56.7 kg   SpO2 98%   BMI 22.86 kg/m  Pain Scale: 0-10   Pain Score: 0-No pain   SpO2: SpO2: 98 % O2 Device:SpO2: 98 % O2 Flow Rate: .   IO: Intake/output summary:  Intake/Output Summary (Last 24 hours) at 05/19/2022 1339 Last data filed at 05/19/2022 1056 Gross per 24 hour  Intake 717 ml  Output 1650  ml  Net -933 ml    LBM: Last BM Date : 05/17/22 Baseline Weight: Weight: 56.7 kg Most recent weight: Weight: 56.7 kg       Signed by: Asencion Gowda, NP   Please contact Palliative Medicine Team phone at [phone] for questions and concerns.  For  individual provider: See Shea Evans

## 2022-05-20 DIAGNOSIS — K5900 Constipation, unspecified: Secondary | ICD-10-CM | POA: Diagnosis not present

## 2022-05-20 DIAGNOSIS — I10 Essential (primary) hypertension: Secondary | ICD-10-CM | POA: Diagnosis not present

## 2022-05-20 DIAGNOSIS — Z7189 Other specified counseling: Secondary | ICD-10-CM | POA: Diagnosis not present

## 2022-05-20 DIAGNOSIS — N390 Urinary tract infection, site not specified: Secondary | ICD-10-CM | POA: Diagnosis not present

## 2022-05-20 DIAGNOSIS — R112 Nausea with vomiting, unspecified: Secondary | ICD-10-CM | POA: Diagnosis not present

## 2022-05-20 NOTE — TOC Progression Note (Signed)
Transition of Care [facility]) - Progression Note    Patient Details  Name: Sherry [name] MRN: [id number] Date of Birth: 02/05/1931  Transition of Care [facility]) CM/SW Contact  Beverly Sessions, RN Phone Number: 05/20/2022, 2:32 PM  Clinical Narrative:    Plan for patient to return to [facility]  Seth Bake at [facility] notified         Expected Discharge Plan and Services                                               Social Determinants of Health ([place]) Interventions SDOH Screenings   Food Insecurity: No Food Insecurity (05/15/2022)  Housing: Low Risk  (05/15/2022)  Transportation Needs: No Transportation Needs (05/15/2022)  Utilities: Not At Risk (05/15/2022)  Tobacco Use: Low Risk  (05/16/2022)    Readmission Risk Interventions    01/07/2022    2:50 PM  Readmission Risk Prevention Plan  Post Dischage Appt Complete  Medication Screening Complete  Transportation Screening Complete

## 2022-05-20 NOTE — Progress Notes (Signed)
[facility]                                                                               Sherry [name], is a 89 y.o. female, DOB - 25-Dec-1930, [id number] Admit date - 05/15/2022    Outpatient Primary MD for the patient is Sherry Aus, MD  LOS - 4  days    Brief summary   Sherry [name] is a 89 y.o. 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   Sherry [name] states that she has only been at [facility] for approximately 24 hours before she was sent to the ED due to increased nausea with several episodes of vomiting.  In addition, her previously present dizziness and headache seem to have worsened in severity. CT of the head was obtained that did not show any acute abnormalities or changes. CT of the abdomen was obtained with final reading still pending. Chest x-ray was obtained with no acute cardiopulmonary disease. TRH contacted for admission due to complicated UTI.  MRI Brain showed  4 mm acute infarct within the left dorsolateral aspect of the medulla, new from the prior brain MRI of 04/25/2022. Known moderately large late subacute left PICA territory infarct within the left cerebellar hemisphere. Re demonstrated small focus of chronic encephalomalacia/gliosis within the anterior right frontal lobe,. Neurology consulted and recommendations given.  Therapy eval recommending SNF.    Assessment & Plan    Assessment and Plan: * Complicated UTI (urinary tract infection) Patient is presenting with worsening headache and dizziness, in addition to new nausea and vomiting with urinalysis concerning for UTI in the setting of Foley catheter that was exchanged on 05/09/2022.  Foley was  initially placed in the setting of urinary retention secondary to CVA. CT of the abdomen demonstrates bladder wall thickening with mucosal and enhancement. Continue with oral cefdinir, foley was exchanged and continue with flomax.  D/c antibiotics after tomorrow;s dose.   Nausea and vomiting Patient endorses worsening nausea and vomiting over the past 24 hours.  I wonder if may be due to underlying infection, versus part of the vertigo she has been experiencing post CVA.  Given she has been on Thorazine on recent admission with improvement, will restart but at decreased frequency due to QT prolongation  Appears to have much improved.    Vertigo following cerebrovascular accident Patient notes that her symptoms of vertigo including headache and dizziness have worsened since leaving the hospital.  Symptomatic management with thorazine 25 mg BID Therapy eval recommending SNF.    Left-sided cerebrovascular accident (CVA) ([place]) MRI Brain showed  4 mm acute infarct within the left dorsolateral aspect of the medulla, new from the prior brain MRI of 04/25/2022. Neurology consulted and recommendations given.  Aspirin and plavix along with lipitor 40 mg daily.  Therapy eval recommending SNF.  Currently waiting for SNF placement.   Essential hypertension Well controlled.  Continue with norvasc   Constipation Significant stool burden seen on  CT imaging. Continue with senna and miralax.   Hypokalemia:  Replaced.     Hyponatremia:  Resolved.    Mild normocytic anemia and thrombocytosis   GERD: Resume protonix 40 mg BID.    Estimated body mass index is 22.86 kg/m as calculated from the following:   Height as of this encounter: 5\' 2"  (1.575 m).   Weight as of this encounter: 56.7 kg.  Code Status: full code.  DVT Prophylaxis:  SCDs Start: 05/15/22 1657   Level of Care: Level of care: Med-Surg Family Communication: spouse at bedside.   Disposition Plan:     Remains inpatient  appropriate:  SNF placement.   Procedures:  MRI brain.   Consultants:   Neurology.  Antimicrobials:   Anti-infectives (From admission, onward)    Start     Dose/Rate Route Frequency Ordered Stop   05/18/22 1000  cefdinir (OMNICEF) capsule 300 mg        300 mg Oral Every 12 hours 05/17/22 1416     05/17/22 2000  ceFEPIme (MAXIPIME) 2 g in sodium chloride 0.9 % 100 mL IVPB  Status:  Discontinued        2 g 200 mL/hr over 30 Minutes Intravenous Every 24 hours 05/16/22 0750 05/17/22 0933   05/17/22 1030  cefTRIAXone (ROCEPHIN) 1 g in sodium chloride 0.9 % 100 mL IVPB  Status:  Discontinued        1 g 200 mL/hr over 30 Minutes Intravenous Every 24 hours 05/17/22 0933 05/17/22 1416   05/15/22 1945  ceFEPIme (MAXIPIME) 2 g in sodium chloride 0.9 % 100 mL IVPB  Status:  Discontinued        2 g 200 mL/hr over 30 Minutes Intravenous Every 12 hours 05/15/22 1857 05/16/22 0750   05/15/22 1530  cefTRIAXone (ROCEPHIN) 1 g in sodium chloride 0.9 % 100 mL IVPB        1 g 200 mL/hr over 30 Minutes Intravenous  Once 05/15/22 1516 05/15/22 1600        Medications  Scheduled Meds:  amLODipine  5 mg Oral Daily   aspirin EC  81 mg Oral Daily   atorvastatin  40 mg Oral Q supper   cefdinir  300 mg Oral Q12H   Chlorhexidine Gluconate Cloth  6 each Topical Daily   chlorproMAZINE  25 mg Oral BID   clopidogrel  75 mg Oral Daily   feeding supplement  1 Container Oral TID BM   pantoprazole  40 mg Oral BID   polyethylene glycol  17 g Oral Daily   senna-docusate  1 tablet Oral BID   sodium chloride flush  3 mL Intravenous Q12H   tamsulosin  0.4 mg Oral Daily   Continuous Infusions: PRN Meds:.acetaminophen **OR** acetaminophen, mouth rinse    Subjective:   Sherry [name] was seen and examined today.  No new complaints.   Objective:   Vitals:   05/19/22 1500 05/19/22 1936 05/20/22 0352 05/20/22 0808  BP: 133/79 138/63 (!) 154/72 (!) 181/78  Pulse: 85 88  88  Resp: 18 20 18 18   Temp:  98.4 F (36.9 C) 98.5 F (36.9 C) 98.6 F (37 C) 97.9 F (36.6 C)  TempSrc:  Oral Oral   SpO2: 99% 97% 96% 98%  Weight:      Height:        Intake/Output Summary (Last 24 hours) at 05/20/2022 1345 Last data filed at 05/20/2022 0900 Gross per 24 hour  Intake 600 ml  Output 900 ml  Net -300  ml    Filed Weights   05/15/22 2211  Weight: 56.7 kg     Exam General exam: Appears calm and comfortable  Respiratory system: Clear to auscultation. Respiratory effort normal. Cardiovascular system: S1 & S2 heard, RRR. No JVD,  No pedal edema. Gastrointestinal system: Abdomen is nondistended, soft and nontender.  Central nervous system: Alert and oriented. No focal neurological deficits. Extremities: Symmetric 5 x 5 power. Skin: No rashes, lesions or ulcers Psychiatry: Judgement and insight appear normal. Mood & affect appropriate.     Data Reviewed:  I have personally reviewed following labs and imaging studies   CBC Lab Results  Component Value Date   WBC 8.2 05/19/2022   RBC 3.86 (L) 05/19/2022   HGB 11.5 (L) 05/19/2022   HCT 35.2 (L) 05/19/2022   MCV 91.2 05/19/2022   MCH 29.8 05/19/2022   PLT 415 (H) 05/19/2022   MCHC 32.7 05/19/2022   RDW 13.5 05/19/2022   LYMPHSABS 2.1 05/15/2022   MONOABS 0.5 05/15/2022   EOSABS 0.1 05/15/2022   BASOSABS 0.1 0000000     Last metabolic panel Lab Results  Component Value Date   NA 138 05/19/2022   K 3.7 05/19/2022   CL 103 05/19/2022   CO2 23 05/19/2022   BUN 25 (H) 05/19/2022   CREATININE 0.49 05/19/2022   GLUCOSE 152 (H) 05/19/2022   GFRNONAA >60 05/19/2022   GFRAA >60 08/15/2017   CALCIUM 9.2 05/19/2022   PHOS 3.0 04/26/2022   PROT 6.5 05/15/2022   ALBUMIN 3.0 (L) 05/15/2022   BILITOT 0.5 05/15/2022   ALKPHOS 62 05/15/2022   AST 23 05/15/2022   ALT 15 05/15/2022   ANIONGAP 12 05/19/2022    CBG (last 3)  No results for input(s): "GLUCAP" in the last 72 hours.    Coagulation Profile: No results for  input(s): "INR", "PROTIME" in the last 168 hours.   Radiology Studies: No results found.     Hosie Poisson M.D. [facility] 05/20/2022, 1:45 PM  Available via Epic secure chat 7am-7pm After 7 pm, please refer to night coverage provider listed on amion.

## 2022-05-20 NOTE — Plan of Care (Signed)
  Problem: Coping: Goal: Will verbalize positive feelings about self Outcome: Progressing Goal: Will identify appropriate support needs Outcome: Progressing   Problem: Nutrition: Goal: Risk of aspiration will decrease Outcome: Progressing Goal: Dietary intake will improve Outcome: Progressing   Problem: Activity: Goal: Risk for activity intolerance will decrease Outcome: Progressing   Problem: Elimination: Goal: Will not experience complications related to urinary retention Outcome: Progressing   Problem: Pain Managment: Goal: General experience of comfort will improve Outcome: Progressing   Problem: Safety: Goal: Ability to remain free from injury will improve Outcome: Progressing   Problem: Skin Integrity: Goal: Risk for impaired skin integrity will decrease Outcome: Progressing

## 2022-05-20 NOTE — Care Management Important Message (Signed)
Important Message  Patient Details  Name: Sherry [name] MRN: [id number] Date of Birth: October 03, 1930   Medicare Important Message Given:  Yes     Dannette Barbara 05/20/2022, 12:26 PM

## 2022-05-20 NOTE — Progress Notes (Signed)
Daily Progress Note   Patient Name: Sherry [name]       Date: 05/20/2022 DOB: [birth year]  Age: 89 y.o. MRN#: [id number] Attending Physician: Hosie Poisson, MD Primary Care Physician: Rusty Aus, MD Admit Date: 05/15/2022  Reason for Consultation/Follow-up: Establishing goals of care  Subjective: Notes and labs reviewed.  In to see patient.  Patient is resting in bed with husband at bedside.  She states she continues to have a headache.  Husband states he believes questions regarding Plavix have been appropriately answered, and they have no other questions.  In regards to support, husband discusses that their son has a friend who is a physician, and that they have been speaking with him regarding his thoughts on care, including specific physician involvement.  Husband states he should not provide the name of the physician who is friends with his son, but does state he practices at [facility].  Length of Stay: 4  Current Medications: Scheduled Meds:   amLODipine  5 mg Oral Daily   aspirin EC  81 mg Oral Daily   atorvastatin  40 mg Oral Q supper   cefdinir  300 mg Oral Q12H   Chlorhexidine Gluconate Cloth  6 each Topical Daily   chlorproMAZINE  25 mg Oral BID   clopidogrel  75 mg Oral Daily   feeding supplement  1 Container Oral TID BM   pantoprazole  40 mg Oral BID   polyethylene glycol  17 g Oral Daily   senna-docusate  1 tablet Oral BID   sodium chloride flush  3 mL Intravenous Q12H   tamsulosin  0.4 mg Oral Daily    Continuous Infusions:   PRN Meds: acetaminophen **OR** acetaminophen, mouth rinse  Physical Exam Pulmonary:     Effort: Pulmonary effort is normal.  Neurological:     Mental Status: She is alert.             Vital Signs: BP (!) 181/78 (BP Location: Right  Arm)   Pulse 88   Temp 97.9 F (36.6 C)   Resp 18   Ht 5\' 2"  (1.575 m)   Wt 56.7 kg   SpO2 98%   BMI 22.86 kg/m  SpO2: SpO2: 98 % O2 Device: O2 Device: Room Air O2 Flow Rate:    Intake/output summary:  Intake/Output Summary (Last 24  hours) at 05/20/2022 1242 Last data filed at 05/20/2022 0900 Gross per 24 hour  Intake 600 ml  Output 900 ml  Net -300 ml   LBM: Last BM Date : 05/17/22 Baseline Weight: Weight: 56.7 kg Most recent weight: Weight: 56.7 kg   Patient Active Problem List   Diagnosis Date Noted   Complicated UTI (urinary tract infection) 05/15/2022   Nausea and vomiting 05/15/2022   Constipation 05/15/2022   Antiplatelet or antithrombotic long-term use 05/04/2022   Peptic ulcer 05/03/2022   Upper GI bleed 05/02/2022   Anemia, posthemorrhagic, acute 05/02/2022   Acute urinary retention 05/02/2022   Vertigo following cerebrovascular accident 05/02/2022   Leukocytosis 05/02/2022   Lactic acidosis 05/02/2022   Hypertensive emergency 04/26/2022   Cerebellar stroke ([place]) 04/26/2022   Left-sided cerebrovascular accident (CVA) ([place]) 04/25/2022   CVA (cerebral vascular accident) ([place]) 04/25/2022   UTI (urinary tract infection) [id number]   Acute metabolic encephalopathy 0000000   Encephalopathy 01/04/2022   Hypertensive urgency 01/03/2022   Back pain 05/29/2021   Left hip pain 05/28/2021   Hypokalemia 05/28/2021   Orthostatic hypotension 05/25/2021   Left sided sciatica 05/24/2021   Duodenitis 05/18/2021   Anemia of chronic disease 05/16/2021   Abdominal pain 05/15/2021   Thrombocytosis 05/15/2021   Essential hypertension 04/30/2021   Spinal stenosis at L4-L5 level 04/27/2021   Intractable low back pain 04/27/2021   Low back pain 04/26/2021   Closed C1 fracture ([place]) 02/12/2020   Neck fracture ([place]) 02/11/2020   Chest pain with moderate risk for cardiac etiology 05/05/2017   Shortness of breath 05/05/2017   Hyperlipidemia 09/25/2016   Neuropathy  09/25/2016   Statin intolerance 09/25/2016   Anxiety 09/25/2016   Palpitations 09/25/2016    Palliative Care Assessment & Plan    Recommendations/Plan: Recommend considering Lyrica for headaches secondary to CVA. Continue DNR/DNI status.  Code Status:    Code Status Orders  (From admission, onward)           Start     Ordered   05/19/22 1354  Do not attempt resuscitation (DNR)  Continuous       Question Answer Comment  If patient has no pulse and is not breathing Do Not Attempt Resuscitation   If patient has a pulse and/or is breathing: Medical Treatment Goals LIMITED ADDITIONAL INTERVENTIONS: Use medication/IV fluids and cardiac monitoring as indicated; Do not use intubation or mechanical ventilation (DNI), also provide comfort medications.  Transfer to Progressive/Stepdown as indicated, avoid Intensive Care.   Consent: Discussion documented in EHR or advanced directives reviewed      05/19/22 1353           Code Status History     Date Active Date Inactive Code Status Order ID Comments User Context   05/16/2022 1552 05/19/2022 1353 Full Code [id number]  Oran Rein, MD Inpatient   05/15/2022 1700 05/16/2022 1552 DNR [id number]  Jose Persia, MD ED   04/25/2022 1810 05/13/2022 0043 Full Code [id number]  Donita Brooks, NP Inpatient   04/25/2022 1432 04/25/2022 1454 Full Code [id number]  Cox, [place], DO ED   01/03/2022 2019 01/10/2022 1848 Full Code [id number]  Elwyn Reach, MD ED   05/28/2021 2110 06/02/2021 1730 Full Code [id number]  Shela Leff, MD Inpatient   05/15/2021 0044 05/26/2021 2236 Full Code [id number]  Para Skeans, MD ED   04/26/2021 1741 05/06/2021 1534 Full Code [id number]  Rise Patience, MD ED   02/11/2020 1655 02/17/2020 2016 Full Code [id number]  Shawna Clamp,  MD ED       Prognosis:  Unable to determine   Thank you for allowing the Palliative Medicine Team to assist in the care of this patient.    Asencion Gowda, NP  Please contact  Palliative Medicine Team phone at [phone] for questions and concerns.

## 2022-05-20 NOTE — Progress Notes (Signed)
Mobility Specialist - Progress Note   05/20/22 1100  Mobility  Activity Refused mobility     Pt sleeping on arrival, awakened by voice. Pt requests to return this afternoon for OOB activity. Will attempt another date/time.    Kathee Delton Mobility Specialist 05/20/22, 11:38 AM

## 2022-05-21 DIAGNOSIS — I10 Essential (primary) hypertension: Secondary | ICD-10-CM | POA: Diagnosis not present

## 2022-05-21 DIAGNOSIS — N390 Urinary tract infection, site not specified: Secondary | ICD-10-CM | POA: Diagnosis not present

## 2022-05-21 DIAGNOSIS — R112 Nausea with vomiting, unspecified: Secondary | ICD-10-CM | POA: Diagnosis not present

## 2022-05-21 DIAGNOSIS — I69398 Other sequelae of cerebral infarction: Secondary | ICD-10-CM | POA: Diagnosis not present

## 2022-05-21 MED ORDER — CLOPIDOGREL BISULFATE 75 MG PO TABS: 75.0000 mg | ORAL_TABLET | Freq: Every day | ORAL | 1 refills | Status: DC

## 2022-05-21 NOTE — Plan of Care (Signed)
EMS transported patient.

## 2022-05-21 NOTE — Progress Notes (Addendum)
Report called to nurse at [facility].  Patient to go to room 116.  Foley to stay in upon discharge to facility.  All questions answered, awaiting EMS transport.

## 2022-05-21 NOTE — Discharge Summary (Addendum)
Physician Discharge Summary   Patient: Sherry CLOOS MRN: [id number] DOB: [birth year]  Admit date:     05/15/2022  Discharge date: 05/21/22  Discharge Physician: Hosie Poisson   PCP: Rusty Aus, MD   Recommendations at discharge:  Please follow up with Neurology as recommended, in 2 to 4 weeks Please follow up with PCP in one week.  Please follow up with cbc and bmp in one week.   Recommend outpatient follow up with Urology for voiding trial.   Discharge Diagnoses: Principal Problem:   Complicated UTI (urinary tract infection) Active Problems:   Nausea and vomiting   Vertigo following cerebrovascular accident   Left-sided cerebrovascular accident (CVA) ([place])   Essential hypertension   Constipation   Acute urinary retention   Hospital Course: Sherry [name] is a 89 y.o. 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   [name] states that she has only been at [facility] for approximately 24 hours before she was sent to the ED due to increased nausea with several episodes of vomiting.  In addition, her previously present dizziness and headache seem to have worsened in severity. CT of the head was obtained that did not show any acute abnormalities or changes. CT of the abdomen was obtained with final reading still pending. Chest x-ray was obtained with no acute cardiopulmonary disease. [facility] contacted for admission due to complicated UTI.  MRI Brain showed  4 mm acute infarct within the left dorsolateral aspect of the medulla, new from the prior brain MRI of 04/25/2022. Known moderately large late subacute left PICA territory infarct within the left cerebellar hemisphere. Re demonstrated small  focus of chronic encephalomalacia/gliosis within the anterior right frontal lobe,. Neurology consulted and recommendations given.  Therapy eval recommending SNF.    Assessment and Plan:  Complicated UTI (urinary tract infection) Patient is presenting with worsening headache and dizziness, in addition to new nausea and vomiting with urinalysis concerning for UTI in the setting of Foley catheter that was exchanged on 05/09/2022.  Foley was initially placed in the setting of urinary retention secondary to CVA. CT of the abdomen demonstrates bladder wall thickening with mucosal and enhancement. Completed the course of antibiotics.  foley was exchanged and continue with flomax.     Nausea and vomiting Patient endorses worsening nausea and vomiting over the past 24 hours.  I wonder if may be due to underlying infection, versus part of the vertigo she has been experiencing post CVA.  Given she has been on Thorazine on recent admission with improvement, will restart but at decreased frequency due to QT prolongation  Appears to have much improved.      Vertigo following cerebrovascular accident Patient notes that her symptoms of vertigo including headache and dizziness have worsened since leaving the hospital.  Symptomatic management with thorazine 25 mg BID Therapy eval recommending SNF.      Left-sided cerebrovascular accident (CVA) ([place]) MRI Brain showed  4 mm acute infarct within the left dorsolateral aspect of the medulla, new from the prior brain MRI of 04/25/2022. Neurology consulted and recommendations given.  Aspirin and plavix along with lipitor 40 mg daily.  Therapy eval recommending SNF.  Currently waiting for SNF placement.    Essential hypertension Well controlled.  Continue with home meds.  Constipation Significant stool burden seen on CT imaging. Continue with senna and miralax.    Hypokalemia:  Replaced.        Hyponatremia:  Resolved.      Mild normocytic anemia  and thrombocytosis     GERD: Resume protonix 40 mg BID.      Estimated body mass index is 22.86 kg/m as calculated from the following:   Height as of this encounter: 5\' 2"  (1.575 m).   Weight as of this encounter: 56.7 kg.        Consultants: neurology.  Procedures performed: MRI brain.  Disposition: Skilled nursing facility Diet recommendation:  Discharge Diet Orders (From admission, onward)     Start     Ordered   05/21/22 0000  Diet - low sodium heart healthy        05/21/22 1302           Regular diet DISCHARGE MEDICATION: Allergies as of 05/21/2022       Reactions   Morphine    Other reaction(s): Other (See Comments) Apnea with large dose Respiratory Distress   Buprenorphine Hcl    Other reaction(s): Other (See Comments), Other (See Comments) Respiratory Distress Respiratory Distress   Codeine    Epinephrine    Other reaction(s): Other (See Comments) Severe anxiety; "makes my heart race" - would like to avoid during surgery   Morphine And Related Other (See Comments)   Respiratory Distress   Percocet [oxycodone-acetaminophen]         Medication List     STOP taking these medications    tuberculin 5 UNIT/0.1ML injection       TAKE these medications    acetaminophen 325 MG tablet Commonly known as: TYLENOL Take 2 tablets (650 mg total) by mouth every 6 (six) hours as needed for headache.   aspirin EC 81 MG tablet Take 1 tablet (81 mg total) by mouth daily. Swallow whole.   atorvastatin 40 MG tablet Commonly known as: LIPITOR Take 1 tablet (40 mg total) by mouth daily.   B-12 1000 MCG Caps Take 2,000 mcg by mouth daily.   cholecalciferol 25 MCG (1000 UT) tablet Generic drug: Cholecalciferol Take 2,000 Units by mouth daily.   clopidogrel 75 MG tablet Commonly known as: PLAVIX Take 1 tablet (75 mg total) by mouth daily. Start taking on: May 22, 2022   hydrALAZINE 50 MG tablet Commonly known as: APRESOLINE Take 50 mg by mouth  every 8 (eight) hours as needed.   lactose free nutrition Liqd Take 237 mLs by mouth 3 (three) times daily between meals.   metoCLOPramide 5 MG tablet Commonly known as: REGLAN Take 5 mg by mouth every 6 (six) hours as needed for nausea.   ondansetron 4 MG tablet Commonly known as: ZOFRAN Take one tablet by mouth every 6 hours as needed. Give one tablet by mouth every 8 hours for 2 days. Give one tablet by mouth twice daily for 2 days.   pantoprazole 40 MG tablet Commonly known as: PROTONIX Take 1 tablet (40 mg total) by mouth 2 (two) times daily for 21 days, THEN 1 tablet (40 mg total) daily. Start taking on: May 12, 2022   senna-docusate 8.6-50 MG tablet Commonly known as: Senokot-S Take 1 tablet by mouth 2 (two) times daily.   tamsulosin 0.4 MG Caps capsule Commonly known as: FLOMAX Take 1 capsule (0.4 mg total) by mouth daily.        Discharge Exam: Filed Weights   05/15/22 2211  Weight: 56.7  kg   General exam: Appears calm and comfortable  Respiratory system: Clear to auscultation. Respiratory effort normal. Cardiovascular system: S1 & S2 heard, RRR. No JVD,  Gastrointestinal system: Abdomen is nondistended, soft and nontender.  Central nervous system: Alert and oriented. No focal neurological deficits. Extremities: Symmetric 5 x 5 power. Skin: No rashes,  Psychiatry: Mood & affect appropriate.    Condition at discharge: fair  The results of significant diagnostics from this hospitalization (including imaging, microbiology, ancillary and laboratory) are listed below for reference.   Imaging Studies: MR BRAIN WO CONTRAST  Result Date: 05/18/2022 CLINICAL DATA:  Provided history: Recent stroke. Ongoing vertigo. History of breast cancer. 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 EXAM: MRI HEAD WITHOUT CONTRAST TECHNIQUE: Multiplanar, multiecho pulse sequences of the brain and surrounding structures were obtained without intravenous  contrast. COMPARISON:  Prior head CT examinations 05/15/2022 and earlier. Brain MRI 04/25/2022. FINDINGS: Brain: Mild generalized parenchymal atrophy. New from the prior brain MRI of 04/25/2022, there is a 4 mm acute infarct within the left dorsolateral aspect of the medulla (series 5, image 6). Known moderately large late subacute left PICA territory infarct within the left cerebellar hemisphere. Redemonstrated small focus of chronic encephalomalacia/gliosis within the anterior right frontal lobe, which may be posttraumatic in etiology or may reflect a chronic infarct. Redemonstrated small chronic cortical infarct within the right occipital lobe. Background moderate multifocal T2 FLAIR hyperintense signal abnormality within the cerebral white matter, nonspecific but compatible with chronic small vessel ischemic disease. Redemonstrated chronic lacunar infarcts within the left basal ganglia/internal capsule. Punctate chronic microhemorrhage within the left frontoparietal white matter. No evidence of an intracranial mass. No extra-axial fluid collection. No midline shift. Vascular: Signal abnormality within the intracranial left vertebral artery compatible with known vessel occlusion. Flow voids preserved elsewhere within the proximal large arterial vessels. 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 Incompletely assessed cervical spondylosis. Sinuses/Orbits: No mass or acute finding within the imaged orbits. Prior bilateral ocular lens replacement. Mucous retention cyst within a posterior right ethmoid air cell. Small mucous retention cyst within the left sphenoid sinus. Other: Right mastoid effusion. Trace fluid also present within the left mastoid air cells. Impression #1 will be called to the ordering clinician or representative by the Radiologist Assistant, and communication documented in the PACS or [employer]. IMPRESSION: 1. 4 mm acute infarct within the  left dorsolateral aspect of the medulla, new from the prior brain MRI of 04/25/2022. 2. Known moderately large late subacute left PICA territory infarct within the left cerebellar hemisphere. 3. Redemonstrated small focus of chronic encephalomalacia/gliosis within the anterior right frontal lobe, which may be posttraumatic in etiology or may reflect a chronic infarct. 4. Redemonstrated small chronic cortically-based infarct within the right occipital lobe. 5. Background parenchymal atrophy and chronic small vessel ischemic disease with chronic small-vessel infarcts, as described. 6. Signal abnormality within the intracranial left vertebral artery compatible with known vessel occlusion. 7. Known chronic nonunited type 2 dens fracture. 8. Paranasal sinus disease, as outlined. 9. Right mastoid effusion. Electronically Signed   By: Kellie Simmering D.O.   On: 05/18/2022 12:46   CT Abdomen Pelvis W Contrast  Result Date: 05/15/2022 CLINICAL DATA:  Abdomen pain EXAM: CT ABDOMEN AND PELVIS WITH CONTRAST TECHNIQUE: Multidetector CT imaging of the abdomen and pelvis was performed using the standard protocol following bolus administration of intravenous contrast. RADIATION DOSE REDUCTION: This exam was performed according to the departmental dose-optimization program which  includes automated exposure control, adjustment of the mA and/or kV according to patient size and/or use of iterative reconstruction technique. CONTRAST:  12mL OMNIPAQUE IOHEXOL 300 MG/ML  SOLN COMPARISON:  CT 05/02/2022, 05/17/2021 FINDINGS: Lower chest: Lung bases demonstrate small left greater than right pleural effusions which are new. Partial consolidations in the lower lobes favored to represent atelectasis. Mild scarring right middle lobe Hepatobiliary: Distended gallbladder with stones. No inflammatory change or biliary dilatation. Small cyst within the central liver. Pancreas: No inflammation. Stable 8 mm cyst at the uncinate process. Spleen:  Normal in size without focal abnormality. Adrenals/Urinary Tract: Adrenal glands are normal. Kidneys show no hydronephrosis. The bladder contains Foley catheter and appears slightly thick-walled with mild mucosal enhancement. Stomach/Bowel: Stomach is nonenlarged. Thickening of the pylorus with mucosal enhancement. No dilated small bowel. Large feces in the colon and rectum. Vascular/Lymphatic: Moderate aortic atherosclerosis. No aneurysm. No suspicious lymph nodes. Reproductive: Status post hysterectomy. No adnexal masses. Other: Negative for pelvic effusion.  No free air. Musculoskeletal: No acute or suspicious osseous abnormality. Multilevel degenerative change with grade 1 anterolisthesis L5 on S1. Stable 1.8 cm nodule in the right inferior breast. IMPRESSION: 1. Small left greater than right pleural effusions with partial consolidations in the lower lobes favored to represent atelectasis. 2. Distended gallbladder with stones but no inflammatory change or biliary dilatation. 3. Thickening of the pylorus with mucosal enhancement, suspicious for peptic ulcer disease. No evidence for perforation at this time. 4. Slightly thick-walled appearance of the urinary bladder with mild mucosal enhancement, correlate for cystitis. 5. Large volume of feces in the colon and rectum suggesting constipation. 6. Stable 1.8 cm right inferior breast nodule 7. Aortic atherosclerosis. Aortic Atherosclerosis (ICD10-I70.0). Electronically Signed   By: Donavan Foil M.D.   On: 05/15/2022 17:15   DG Chest Port 1 View  Result Date: 05/15/2022 CLINICAL DATA:  Weakness EXAM: PORTABLE CHEST 1 VIEW COMPARISON:  Radiograph 05/01/2022 FINDINGS: Unchanged cardiomediastinal silhouette. There is no new focal airspace consolidation. There is no pleural effusion or evidence of pneumothorax. Bilateral shoulder degenerative changes with unchanged osteochondral joint bodies. Thoracic spondylosis. IMPRESSION: No evidence of acute cardiopulmonary  disease. Electronically Signed   By: Maurine Simmering M.D.   On: 05/15/2022 15:20   CT Head Wo Contrast  Result Date: 05/15/2022 CLINICAL DATA:  Concern for acute stroke EXAM: CT HEAD WITHOUT CONTRAST TECHNIQUE: Contiguous axial images were obtained from the base of the skull through the vertex without intravenous contrast. RADIATION DOSE REDUCTION: This exam was performed according to the departmental dose-optimization program which includes automated exposure control, adjustment of the mA and/or kV according to patient size and/or use of iterative reconstruction technique. COMPARISON:  None Available. FINDINGS: Brain: No acute intracranial hemorrhage. No focal mass lesion. No CT evidence of acute infarction. No midline shift or mass effect. No hydrocephalus. Basilar cisterns are patent. There are periventricular and subcortical white matter hypodensities. Generalized cortical atrophy. Vascular: No hyperdense vessel or unexpected calcification. Skull: Normal. Negative for fracture or focal lesion. Sinuses/Orbits: Paranasal sinuses and mastoid air cells are clear. Orbits are clear. Other: None. IMPRESSION: 1. No acute intracranial findings. 2. Atrophy and white matter microvascular disease. Electronically Signed   By: Suzy Bouchard M.D.   On: 05/15/2022 15:16   DG Abd Portable 1V  Result Date: 05/05/2022 CLINICAL DATA:  Abdominal discomfort. EXAM: PORTABLE ABDOMEN - 1 VIEW COMPARISON:  05/22/2021 and CT 05/02/2022 FINDINGS: Calcifications in the right abdomen may be associated with the known gallstones. Lung bases are clear. Nonobstructive  bowel gas pattern. Surgical clip in the right hemipelvis. Limited evaluation for free air on the supine images. IMPRESSION: 1. Nonobstructive bowel gas pattern. 2. Cholelithiasis. Electronically Signed   By: Markus Daft M.D.   On: 05/05/2022 17:08   CT ABDOMEN PELVIS W CONTRAST  Result Date: 05/02/2022 CLINICAL DATA:  Abdominal pain, acute. Nonlocalized. History of duodenal  perforation. EXAM: CT ABDOMEN AND PELVIS WITH CONTRAST TECHNIQUE: Multidetector CT imaging of the abdomen and pelvis was performed using the standard protocol following bolus administration of intravenous contrast. RADIATION DOSE REDUCTION: This exam was performed according to the departmental dose-optimization program which includes automated exposure control, adjustment of the mA and/or kV according to patient size and/or use of iterative reconstruction technique. CONTRAST:  80mL OMNIPAQUE IOHEXOL 350 MG/ML SOLN COMPARISON:  05/17/2021 FINDINGS: Lower chest: No acute abnormality. Hepatobiliary: No suspicious liver abnormality. Small cyst within central left liver measures 0.8 cm and is unchanged from previous exam. Lobe of gallbladder appears distended. There are 2 no gallbladder wall thickening. No bile duct dilatation. Stones within the gallbladder measuring up to 2 cm. Pancreas: Cyst in uncinate process measures 8 mm and is unchanged from the previous exam. No main duct dilatation, inflammation or distension. Spleen: Normal in size without focal abnormality. Adrenals/Urinary Tract: Normal adrenal glands. No kidney mass, hydronephrosis, or nephrolithiasis. Urinary bladder is decompressed around a Foley catheter. Stomach/Bowel: There is wall thickening involving the pylorus and descending portion of the duodenum with surrounding soft tissue stranding. Transmural gas collection is identified within the anterior wall of the pylorus measuring 1.2 cm, image 34/3. On the previous exam this measures 1.1 cm. This may represent an area of transmural ulceration versus small duodenal diverticulum. No sign of perforation. The remaining small bowel loops are unremarkable. No pathologic dilatation of the large or small bowel loops. Sigmoid diverticulosis without acute diverticulitis. Moderate retained stool noted within the colon. Vascular/Lymphatic: Aortic atherosclerosis. No enlarged abdominal or pelvic lymph nodes.  Reproductive: Status post hysterectomy. No adnexal masses. Other: No free fluid or focal fluid collections. Musculoskeletal: Lumbar spondylosis and facet arthropathy. No acute or suspicious osseous findings. Postoperative changes from prior lumpectomy noted within the right breast. Soft tissue density nodule within the right lower breast measures 1.7 cm, image 18/3 previously this measured 2.0 cm. IMPRESSION: 1. There is wall thickening involving the pylorus and descending portion of the duodenum with surrounding soft tissue stranding. Transmural gas collection is identified within the anterior wall of the pylorus measuring 1.2 cm. On the previous exam this measures 1.1 cm. This may represent an area of transmural ulceration versus small duodenal diverticulum. No sign of perforation. 2. Gallstones with distended gallbladder. No gallbladder wall thickening. 3. Sigmoid diverticulosis without acute diverticulitis. 4. Moderate retained stool noted within the colon. Correlate for any clinical signs or symptoms of constipation. 5. Stable 8 mm cystic lesion in the uncinate process of the pancreas. Given the patient's age, no further workup or follow-up is recommended. 6. Soft tissue density nodule within the right lower breast measures 1.7 cm previously this measured 2.0 cm. 7.  Aortic Atherosclerosis (ICD10-I70.0). Electronically Signed   By: Kerby Moors M.D.   On: 05/02/2022 10:39   DG CHEST PORT 1 VIEW  Result Date: 05/01/2022 CLINICAL DATA:  Leukocytosis. EXAM: PORTABLE CHEST 1 VIEW COMPARISON:  Chest radiograph dated 04/25/2022. FINDINGS: No focal consolidation, pleural effusion, pneumothorax. Linear density along the lateral left lung most consistent with a skin fold. Repeat radiograph with repositioning of the patient may provide better evaluation.  The cardiac silhouette is within normal limits. No acute osseous pathology. IMPRESSION: 1. No acute cardiopulmonary process. 2. Probable skin fold along the lateral  left lung. Electronically Signed   By: Anner Crete M.D.   On: 05/01/2022 17:52   CT HEAD WO CONTRAST (5MM)  Result Date: 04/30/2022 CLINICAL DATA:  Stroke follow-up EXAM: CT HEAD WITHOUT CONTRAST TECHNIQUE: Contiguous axial images were obtained from the base of the skull through the vertex without intravenous contrast. RADIATION DOSE REDUCTION: This exam was performed according to the departmental dose-optimization program which includes automated exposure control, adjustment of the mA and/or kV according to patient size and/or use of iterative reconstruction technique. COMPARISON:  CT Head 04/26/22, MRI Head 04/25/22 FINDINGS: Brain: Evolving left cerebellar infarct. No evidence of hemorrhage. No CT evidence of a new infarct. Redemonstrated is a subacute right occipital lobe infarct and a chronic right frontal lobe infarct. There is also a chronic infarct in the left caudate head. Sequela of moderate chronic microvascular ischemic change. No hydrocephalus. No extra-axial fluid collection. Vascular: No hyperdense vessel or unexpected calcification. Skull: Normal. Negative for fracture or focal lesion. Sinuses/Orbits: No mastoid or middle ear effusion. Frothy secretions in the left sphenoid sinus in the posterior right ethmoid air cells, which can be seen in the setting of acute sinusitis. Bilateral lens replacement. Orbits are otherwise unremarkable. Other: None. IMPRESSION: Evolving left cerebellar infarct. No evidence of hemorrhage. No CT evidence of a new infarct. No hydrocephalus. Electronically Signed   By: Marin Roberts M.D.   On: 04/30/2022 12:38   ECHOCARDIOGRAM COMPLETE  Result Date: 04/26/2022    ECHOCARDIOGRAM REPORT   Patient Name:   AZEL KELCH Date of Exam: 04/26/2022 Medical Rec #:  [id number]         Height:       62.0 in Accession #:    [id number]        Weight:       123.9 lb Date of Birth:  08/09/1930        BSA:          1.560 m Patient Age:    54 years          BP:            169/63 mmHg Patient Gender: F                 HR:           85 bpm. Exam Location:  Inpatient Procedure: 2D Echo, 3D Echo, Cardiac Doppler and Color Doppler Indications:    Stroke  History:        Patient has no prior history of Echocardiogram examinations.                 Abnormal ECG, Signs/Symptoms:Chest Pain and Dyspnea; Risk                 Factors:Hypertension and Dyslipidemia.  Sonographer:    Roseanna Rainbow RDCS Referring Phys: [id number] [place]  1. There is a mid cavity gradient of 67mmHg related to hyperdynamic LVF and mid cavity obliteration during systole. Left ventricular ejection fraction, by estimation, is 70 to 75%. Left ventricular ejection fraction by 3D volume is 72 %. The left ventricle has hyperdynamic function. The left ventricle has no regional wall motion abnormalities. There is severe left ventricular hypertrophy of the basal segment. Left ventricular diastolic parameters are consistent with Grade I diastolic dysfunction (impaired relaxation).  2. Right ventricular systolic function is normal.  The right ventricular size is normal. There is mildly elevated pulmonary artery systolic pressure. The estimated right ventricular systolic pressure is [id number] mmHg.  3. The mitral valve is normal in structure. Mild mitral valve regurgitation. No evidence of mitral stenosis.  4. The aortic valve is tricuspid. Aortic valve regurgitation is trivial. Aortic valve sclerosis/calcification is present, without any evidence of aortic stenosis. Aortic valve area, by VTI measures 3.65 cm. Aortic valve mean gradient measures 7.0 mmHg.  Aortic valve Vmax measures 1.79 m/s.  5. The inferior vena cava is normal in size with greater than 50% respiratory variability, suggesting right atrial pressure of 3 mmHg. Conclusion(s)/Recommendation(s): No intracardiac source of embolism detected on this transthoracic study. Consider a transesophageal echocardiogram to exclude cardiac source of embolism if clinically  indicated. FINDINGS  Left Ventricle: There is a mid cavity gradient of 58mmHg related to hyperdynamic LVF and mid cavity obliteration during systole. Left ventricular ejection fraction, by estimation, is 70 to 75%. Left ventricular ejection fraction by 3D volume is 72 %. The left ventricle has hyperdynamic function. The left ventricle has no regional wall motion abnormalities. The left ventricular internal cavity size was normal in size. There is severe left ventricular hypertrophy of the basal segment. Left ventricular diastolic parameters are consistent with Grade I diastolic dysfunction (impaired relaxation). Normal left ventricular filling pressure. Right Ventricle: The right ventricular size is normal. No increase in right ventricular wall thickness. Right ventricular systolic function is normal. There is mildly elevated pulmonary artery systolic pressure. The tricuspid regurgitant velocity is 2.92  m/s, and with an assumed right atrial pressure of 3 mmHg, the estimated right ventricular systolic pressure is [id number] mmHg. Left Atrium: Left atrial size was normal in size. Right Atrium: Right atrial size was normal in size. Pericardium: There is no evidence of pericardial effusion. Mitral Valve: The mitral valve is normal in structure. Mild mitral valve regurgitation. No evidence of mitral valve stenosis. MV peak gradient, 7.6 mmHg. The mean mitral valve gradient is 3.0 mmHg. Tricuspid Valve: The tricuspid valve is normal in structure. Tricuspid valve regurgitation is mild . No evidence of tricuspid stenosis. Aortic Valve: The aortic valve is tricuspid. Aortic valve regurgitation is trivial. Aortic valve sclerosis/calcification is present, without any evidence of aortic stenosis. Aortic valve mean gradient measures 7.0 mmHg. Aortic valve peak gradient measures 12.8 mmHg. Aortic valve area, by VTI measures 3.65 cm. Pulmonic Valve: The pulmonic valve was normal in structure. Pulmonic valve regurgitation is not  visualized. No evidence of pulmonic stenosis. Aorta: The aortic root is normal in size and structure. Venous: The inferior vena cava is normal in size with greater than 50% respiratory variability, suggesting right atrial pressure of 3 mmHg. IAS/Shunts: The interatrial septum appears to be lipomatous. No atrial level shunt detected by color flow Doppler.  LEFT VENTRICLE PLAX 2D LVIDd:         2.90 cm         Diastology LVIDs:         1.70 cm         LV e' medial:    5.11 cm/s LV PW:         1.10 cm         LV E/e' medial:  17.2 LV IVS:        1.60 cm         LV e' lateral:   9.36 cm/s LVOT diam:     2.30 cm         LV E/e' lateral:  9.4 LV SV:         106 LV SV Index:   68 LVOT Area:     4.15 cm        3D Volume EF                                LV 3D EF:    Left                                             ventricul                                             ar                                             ejection                                             fraction                                             by 3D                                             volume is                                             72 %.                                 3D Volume EF:                                3D EF:        72 %                                LV EDV:       87 ml                                LV ESV:       24 ml                                LV SV:        62 ml RIGHT VENTRICLE  IVC RV S prime:     11.50 cm/s  IVC diam: 1.50 cm TAPSE (M-mode): 1.5 cm LEFT ATRIUM           Index        RIGHT ATRIUM          Index LA diam:      2.60 cm 1.67 cm/m   RA Area:     7.08 cm LA Vol (A2C): 41.7 ml 26.74 ml/m  RA Volume:   11.30 ml 7.25 ml/m LA Vol (A4C): 13.4 ml 8.59 ml/m  AORTIC VALVE AV Area (Vmax):    3.41 cm AV Area (Vmean):   3.39 cm AV Area (VTI):     3.65 cm AV Vmax:           179.00 cm/s AV Vmean:          117.000 cm/s AV VTI:            0.289 m AV Peak Grad:      12.8 mmHg AV Mean Grad:      7.0  mmHg LVOT Vmax:         147.00 cm/s LVOT Vmean:        95.600 cm/s LVOT VTI:          0.254 m LVOT/AV VTI ratio: 0.88  AORTA Ao Root diam: 2.90 cm Ao Asc diam:  3.00 cm MITRAL VALVE                TRICUSPID VALVE MV Area (PHT): 2.48 cm     TR Peak grad:   34.1 mmHg MV Area VTI:   3.05 cm     TR Vmax:        292.00 cm/s MV Peak grad:  7.6 mmHg MV Mean grad:  3.0 mmHg     SHUNTS MV Vmax:       1.38 m/s     Systemic VTI:  0.25 m MV Vmean:      82.1 cm/s    Systemic Diam: 2.30 cm MV Decel Time: 307 msec MV E velocity: 88.10 cm/s MV A velocity: 125.50 cm/s MV E/A ratio:  0.70 Fransico Him MD Electronically signed by Fransico Him MD Signature Date/Time: 04/26/2022/12:12:26 PM    Final    DG Chest Port 1 View  Result Date: 04/26/2022 CLINICAL DATA:  CVA. EXAM: PORTABLE CHEST 1 VIEW COMPARISON:  Portable yesterday 2:50 p.m. FINDINGS: The heart size and mediastinal contours are within normal limits. There is aortic atherosclerosis. Both lungs are clear. The visualized skeletal structures are intact. The is advanced degenerative change of the shoulders with chronic rotator cuff arthropathy. Right axillary surgical clips. IMPRESSION: No active disease. Aortic atherosclerosis. Electronically Signed   By: Telford Nab M.D.   On: 04/26/2022 06:36   CT HEAD WO CONTRAST (5MM)  Result Date: 04/26/2022 CLINICAL DATA:  89 year old female presenting with headache, vomiting, hypertension. 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 EXAM: CT HEAD WITHOUT CONTRAST TECHNIQUE: Contiguous axial images were obtained from the base of the skull through the vertex without intravenous contrast. RADIATION DOSE REDUCTION: This exam was performed according to the departmental dose-optimization program which includes automated exposure control, adjustment of the mA and/or kV according to patient size and/or use of iterative reconstruction technique. COMPARISON:  Brain MRI, head CT, CTA head and neck yesterday.  FINDINGS: Brain: Cytotoxic edema in the left cerebellum PICA territory is now evident by CT corresponding to the MRI appearance. No hemorrhagic transformation. Basilar cisterns  remain patent, no posterior fossa mass effect at this time. Supratentorial gray-white differentiation remains stable, no convincing cytotoxic edema elsewhere. Chronic small and medium-sized vessel ischemic changes in both hemispheres. No midline shift, ventriculomegaly, mass effect, evidence of mass lesion, intracranial hemorrhage. Vascular: Calcified atherosclerosis at the skull base. Skull: No acute osseous abnormality identified. Sinuses/Orbits: Stable mild paranasal sinus opacity, stables mild paranasal sinus bubbly opacity and mucosal thickening. Tympanic cavities and mastoids remain clear. Other: No acute orbit or scalp soft tissue finding. IMPRESSION: 1. Expected CT appearance of Left cerebellum PICA infarct. No hemorrhagic transformation or posterior fossa mass effect at this time. 2. No other acute intracranial abnormality by CT. 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 Electronically Signed   By: Genevie Ann M.D.   On: 04/26/2022 05:37   DG Chest Port 1 View  Result Date: 04/25/2022 CLINICAL DATA:  Systemic inflammatory response syndrome. EXAM: PORTABLE CHEST 1 VIEW COMPARISON:  Chest radiographs 01/03/2022 FINDINGS: Cardiac silhouette and mediastinal contours are within normal limits. The lungs are clear. No pleural effusion or pneumothorax. Right axillary surgical clips are again seen.) Right-greater-than-left axillary calcific foci as seen on prior CT and unchanged from 01/03/2022 radiographs, likely calcified lymph nodes as can be seen following prior remote granulomatous infection. IMPRESSION: No active disease. Electronically Signed   By: Yvonne Kendall M.D.   On: 04/25/2022 15:09   MR BRAIN WO CONTRAST  Result Date: 04/25/2022 CLINICAL DATA:  Stroke suspected EXAM: MRI HEAD WITHOUT CONTRAST TECHNIQUE:  Multiplanar, multiecho pulse sequences of the brain and surrounding structures were obtained without intravenous contrast. COMPARISON:  Same day CT Head and CTA head/neck FINDINGS: Brain: Acute infarcts in the medial aspect of the left cerebellar hemisphere. There is an additional subtle region of increased signal on diffusion-weighted imaging in the medial aspect of the right occipital lobe (series 5, image 27), favored to represent an additional site of an acute to subacute infarct. Sequela of moderate chronic microvascular ischemic change. No hemorrhage. No hydrocephalus. Vascular: See same day CTA head and neck angiogram for vascular findings. Redemonstrated absent flow void in the V4 segment of the left vertebral artery. Skull and upper cervical spine: Redemonstrated is a chronic appearing type 2 dens fracture. Sinuses/Orbits: Bilateral lens replacement. Trace right mastoid effusion. Trace mucosal thickening in the posterior ethmoid air cells on the right. Other: None. IMPRESSION: 1. 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 2. Additional region of increased signal on diffusion-weighted imaging in the medial aspect of the right occipital lobe, favored to represent an additional site of an acute to subacute infarct. 3. Absent flow void in the V4 segment of the left vertebral artery, in keeping with known occlusion seen on same day CTA head/neck Electronically Signed   By: Marin Roberts M.D.   On: 04/25/2022 13:59   CT Angio Chest Aorta W and/or Wo Contrast  Result Date: 04/25/2022 CLINICAL DATA:  89 year old female with new onset headache, hypertension, vomiting. EXAM: CT ANGIOGRAPHY CHEST WITH CONTRAST TECHNIQUE: Multidetector CT imaging of the chest was performed using the standard protocol during bolus administration of intravenous contrast. Multiplanar CT image reconstructions and MIPs were obtained to evaluate the vascular anatomy. RADIATION DOSE REDUCTION: This exam was  performed according to the departmental dose-optimization program which includes automated exposure control, adjustment of the mA and/or kV according to patient size and/or use of iterative reconstruction technique. CONTRAST:  130mL OMNIPAQUE IOHEXOL 350 MG/ML SOLN COMPARISON:  CTA head and neck today reported separately. Portable chest  01/03/2022. CT Abdomen and Pelvis 05/17/2021. FINDINGS: Cardiovascular: Adequate contrast bolus timing in the pulmonary arterial tree. No pulmonary artery filling defect. Calcified coronary artery atherosclerosis. Comparatively mild Calcified aortic atherosclerosis. Cardiac size at the upper limits of normal. No pericardial effusion. No thoracic aortic dissection or aneurysm. Mediastinum/Nodes: No mediastinal mass or lymphadenopathy. Lungs/Pleura: Major airways are patent. There is asymmetric anterior right lung subpleural scarring, such as can be seen with chest radiation. Mild bilateral lung base atelectasis. No pleural effusion. No suspicious pulmonary nodule or convincing pulmonary inflammation. Upper Abdomen: Negative visible liver, spleen, pancreas, adrenal glands, left kidney, and bowel in the upper abdomen. Musculoskeletal: Mild chronic appearing T1 superior endplate compression. Very severe degenerative osseous changes at both shoulders. No acute or suspicious osseous lesion identified. Other findings: Surgical clips in the right breast, and inferomedial to that there is an oval mixed density fairly circumscribed 19 mm nodule or mass on series 6, image 361. Review of the MIP images confirms the above findings. IMPRESSION: 1. No evidence of pulmonary embolus or other acute finding in the Chest. 2. A 19 mm mixed density nodule or mass in the right breast is inferio-medial to surgical clips. Recommend follow-up Outpatient Mammography correlation. 3. Calcified coronary artery, mild Aortic Atherosclerosis (ICD10-I70.0). Electronically Signed   By: Genevie Ann M.D.   On: 04/25/2022  11:51   CT Angio Head Neck W WO CM  Result Date: 04/25/2022 CLINICAL DATA:  89 year old female with new onset headache, hypertension, vomiting. EXAM: CT ANGIOGRAPHY HEAD AND NECK TECHNIQUE: Multidetector CT imaging of the head and neck was performed using the standard protocol during bolus administration of intravenous contrast. Multiplanar CT image reconstructions and MIPs were obtained to evaluate the vascular anatomy. Carotid stenosis measurements (when applicable) are obtained utilizing NASCET criteria, using the distal internal carotid diameter as the denominator. RADIATION DOSE REDUCTION: This exam was performed according to the departmental dose-optimization program which includes automated exposure control, adjustment of the mA and/or kV according to patient size and/or use of iterative reconstruction technique. CONTRAST:  139mL OMNIPAQUE IOHEXOL 350 MG/ML SOLN COMPARISON:  Head CT 0919 hours today. CTA chest today reported separately. Prior CT face 01/03/2022. previous brain MRI 01/04/2022. 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 Other advanced cervical spine degeneration, including facet arthropathy and developing facet ankylosis. No acute osseous abnormality identified. Upper chest: Negative lung apices and visible superior mediastinum. Other neck: No acute finding. Aortic arch: 3 vessel arch not entirely included. No significant arch atherosclerosis. Right carotid system: Visible brachiocephalic artery and right CCA origin are normal. Mild calcified plaque at the right carotid bifurcation. Mildly tortuous right ICA with no significant stenosis. Left carotid system: Normal left CCA origin. Tortuous cervical left carotid with minimal atherosclerosis and no stenosis. Vertebral arteries: Soft and calcified proximal right subclavian artery plaque with less than 50% stenosis. Normal right vertebral artery origin.  Dominant right vertebral artery is patent to the skull base with no plaque or stenosis. Normal left subclavian artery origin. Calcified proximal left subclavian artery plaque without significant stenosis. Left vertebral artery origin is normal. But the left vertebral artery enhancement fades completely in the V1 segment, with no V2 segment enhancement. No V3 segment enhancement. See intracranial findings below. CTA HEAD Posterior circulation: Nonenhancing proximal left vertebral V3 segment. The vertebrobasilar junction is enhancing, with faint retrograde contrast on the left. Patent right V4 segment with mild calcified plaque and no significant stenosis. Patent right  AICA. Both distal vertebral artery flow voids were maintained on the brain MRI last year. Patent basilar artery without stenosis. SCA and PCA origins are patent. Basilar tip appears to be patent. Posterior communicating arteries are diminutive or absent. But there is up to moderate bilateral P1 segment irregularity and stenosis greater on the left (series 8, image 22. And there is downstream bilateral P3 severe stenosis and branch 10 UA Shin (on the left series 10, image 31). Anterior circulation: Both ICA siphons are patent. There is minimal left siphon calcified plaque and no stenosis. No significant right siphon plaque or stenosis. Patent carotid termini, MCA and ACA origins. Anterior communicating artery and proximal ECA branches are patent. Mild to moderate bilateral A2 and up to severe bilateral 83 segment irregularity and stenosis (series 10, image 28). Left MCA M1 segment is patent with mild irregularity. Patent left MCA bifurcation without high-grade stenosis. However, severe posterior left M2 stenosis on series 10, image 37 with distal enhancement. Contralateral right MCA M1 is also patent and mildly irregular. Right MCA bifurcation is patent with moderate stenosis more affecting the anterior division, which remains irregular. And there is  widespread right M2, M3 moderate and severe stenoses (series 10, image 21). No discrete right MCA branch occlusion identified. Venous sinuses: Early contrast timing, not evaluated. Anatomic variants: None significant. Review of the MIP images confirms the above findings IMPRESSION: 1. 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 This could be secondary to vertebral artery dissection or atherosclerosis occlusion (see #2). 2. Little atherosclerosis in the neck, but evidence of Advanced Intracranial Atherosclerosis with widespread Moderate And Severe 2nd and 3rd COW branch stenoses. No discrete branch occlusion. Study discussed by telephone with Dr. Nathaniel Man on 04/25/2022 at 11:38. She advises an NIH stroke scale of zero, but unexplained vomiting. We discussed Neurology consultation and that follow-up noncontrast Brain MRI to evaluate for occult brain ischemia might be valuable for treatment planning. Electronically Signed   By: Genevie Ann M.D.   On: 04/25/2022 11:45   CT Head Wo Contrast  Result Date: 04/25/2022 CLINICAL DATA:  Headache, new onset, high blood pressure, vomiting. EXAM: CT HEAD WITHOUT CONTRAST TECHNIQUE: Contiguous axial images were obtained from the base of the skull through the vertex without intravenous contrast. RADIATION DOSE REDUCTION: This exam was performed according to the departmental dose-optimization program which includes automated exposure control, adjustment of the mA and/or kV according to patient size and/or use of iterative reconstruction technique. COMPARISON:  Head CT dated 01/03/2022 FINDINGS: Brain: Ventricles are stable in size and configuration. Chronic small vessel ischemic changes are seen within the bilateral periventricular and subcortical white matter regions. No mass, hemorrhage, edema or other evidence of acute parenchymal abnormality. No extra-axial hemorrhage. Vascular:  Chronic calcified atherosclerotic changes of the large vessels at the skull base. No unexpected hyperdense vessel. Skull: Normal. Negative for fracture or focal lesion. Sinuses/Orbits: No acute finding. Other: None. IMPRESSION: 1. No acute findings. No intracranial mass, hemorrhage or edema. 2. Chronic small vessel ischemic changes in the white matter. Electronically Signed   By: Franki Cabot M.D.   On: 04/25/2022 09:26    Microbiology: Results for orders placed or performed during the hospital encounter of 05/15/22  Resp panel by RT-PCR (RSV, Flu A&B, Covid) Urine, Catheterized     Status: None   Collection Time: 05/15/22  2:04 PM   Specimen: Urine, Catheterized; Nasal Swab  Result Value Ref Range  Status   SARS Coronavirus 2 by RT PCR NEGATIVE NEGATIVE Final    Comment: (NOTE) SARS-CoV-2 target nucleic acids are NOT DETECTED.  The SARS-CoV-2 RNA is generally detectable in upper respiratory specimens during the acute phase of infection. The lowest concentration of SARS-CoV-2 viral copies this assay can detect is 138 copies/mL. A negative result does not preclude SARS-Cov-2 infection and should not be used as the sole basis for treatment or other patient management decisions. A negative result may occur with  improper specimen collection/handling, submission of specimen other than nasopharyngeal swab, presence of viral mutation(s) within the areas targeted by this assay, and inadequate number of viral copies(<138 copies/mL). A negative result must be combined with clinical observations, patient history, and epidemiological information. The expected result is Negative.  Fact Sheet for Patients:  EntrepreneurPulse.com.au  Fact Sheet for Healthcare Providers:  IncredibleEmployment.be  This test is no t yet approved or cleared by the [place] FDA and  has been authorized for detection and/or diagnosis of SARS-CoV-2 by FDA under an Emergency Use  Authorization (EUA). This EUA will remain  in effect (meaning this test can be used) for the duration of the COVID-19 declaration under Section 564(b)(1) of the Act, 21 U.S.C.section 360bbb-3(b)(1), unless the authorization is terminated  or revoked sooner.       Influenza A by PCR NEGATIVE NEGATIVE Final   Influenza B by PCR NEGATIVE NEGATIVE Final    Comment: (NOTE) The Xpert Xpress SARS-CoV-2/FLU/RSV plus assay is intended as an aid in the diagnosis of influenza from Nasopharyngeal swab specimens and should not be used as a sole basis for treatment. Nasal washings and aspirates are unacceptable for Xpert Xpress SARS-CoV-2/FLU/RSV testing.  Fact Sheet for Patients: EntrepreneurPulse.com.au  Fact Sheet for Healthcare Providers: IncredibleEmployment.be  This test is not yet approved or cleared by the [place] FDA and has been authorized for detection and/or diagnosis of SARS-CoV-2 by FDA under an Emergency Use Authorization (EUA). This EUA will remain in effect (meaning this test can be used) for the duration of the COVID-19 declaration under Section 564(b)(1) of the Act, 21 U.S.C. section 360bbb-3(b)(1), unless the authorization is terminated or revoked.     Resp Syncytial Virus by PCR NEGATIVE NEGATIVE Final    Comment: (NOTE) Fact Sheet for Patients: EntrepreneurPulse.com.au  Fact Sheet for Healthcare Providers: IncredibleEmployment.be  This test is not yet approved or cleared by the [place] FDA and has been authorized for detection and/or diagnosis of SARS-CoV-2 by FDA under an Emergency Use Authorization (EUA). This EUA will remain in effect (meaning this test can be used) for the duration of the COVID-19 declaration under Section 564(b)(1) of the Act, 21 U.S.C. section 360bbb-3(b)(1), unless the authorization is terminated or revoked.  Performed at [facility], [address], [place], [place] [address]   Urine Culture     Status: Abnormal   Collection Time: 05/15/22  2:04 PM   Specimen: Urine, Random  Result Value Ref Range Status   Specimen Description   Final    URINE, RANDOM Performed at [facility], [address] [place], [place] [address]    Special Requests   Final    URINE, CLEAN CATCH Performed at [facility], [place] [address], [place], [place] [address]    Culture >=100,000 COLONIES/mL ESCHERICHIA COLI (A)  Final   Report Status 05/17/2022 FINAL  Final   Organism ID, Bacteria ESCHERICHIA COLI (A)  Final      Susceptibility   Escherichia  coli - MIC*    AMPICILLIN >=32 RESISTANT Resistant     CEFAZOLIN <=4 SENSITIVE Sensitive     CEFEPIME 0.5 SENSITIVE Sensitive     CEFTRIAXONE <=0.25 SENSITIVE Sensitive     CIPROFLOXACIN <=0.25 SENSITIVE Sensitive     GENTAMICIN <=1 SENSITIVE Sensitive     IMIPENEM <=0.25 SENSITIVE Sensitive     NITROFURANTOIN 32 SENSITIVE Sensitive     TRIMETH/SULFA <=20 SENSITIVE Sensitive     AMPICILLIN/SULBACTAM >=32 RESISTANT Resistant     PIP/TAZO 64 INTERMEDIATE Intermediate     * >=100,000 COLONIES/mL ESCHERICHIA COLI    Labs: CBC: Recent Labs  Lab 05/15/22 1404 05/16/22 0418 05/17/22 0434 05/19/22 1028  WBC 7.8 7.0 7.2 8.2  NEUTROABS 5.0  --   --   --   HGB 11.1* 11.2* 11.0* 11.5*  HCT 34.5* 34.1* 32.9* 35.2*  MCV 92.7 91.2 90.1 91.2  PLT 537* 460* 451* [id number]*   Basic Metabolic Panel: Recent Labs  Lab 05/15/22 1404 05/16/22 0418 05/17/22 0434 05/19/22 1028  NA 131* 135 135 138  K 3.6 3.8 3.3* 3.7  CL 104 102 103 103  CO2 22 24 22 23   GLUCOSE 120* 86 102* 152*  BUN 18 15 18  25*  CREATININE 0.57 0.57 0.58 0.49  CALCIUM 8.9 9.2 8.9 9.2   Liver Function Tests: Recent Labs  Lab 05/15/22 1404  AST 23  ALT 15  ALKPHOS 62  BILITOT 0.5  PROT 6.5  ALBUMIN 3.0*   CBG: No results for input(s): "GLUCAP" in the last 168 hours.  Discharge time spent: 37 minutes.    Signed: Hosie Poisson, MD [facility] 05/21/2022

## 2022-05-21 NOTE — Progress Notes (Signed)
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 She presents with worsening dizziness and headache. Found to have complicated UTI. MRI brain on 05/18/22: 4 mm acute infarct within the left dorsolateral aspect of the medulla, new from the prior brain MRI of 04/25/2022.    PT Comments    Pt seen for PT tx with pt agreeable & pleasant throughout session. Pt reports need to have BM & is able to move BLE towards EOB but requires assistance to upright trunk. Pt completes bed<>BSC via stand pivot with mod<>max assist with cuing for hand placement & sequencing & assistance for turning. Pt with continent BM but reports feeling hot & as if she's going to pass out. Nurse called to room for safety during pivot back to recliner. Pt continues to c/o feeling dizzy & as if she were going to pass out, BP 157/70 mmHg MAP 92, HR 92 bpm.    Recommendations for follow up therapy are one component of a multi-disciplinary discharge planning process, led by the attending physician.  Recommendations may be updated based on patient status, additional functional criteria and insurance authorization.  Follow Up Recommendations  Skilled nursing-short term rehab (<3 hours/day) Can patient physically be transported by private vehicle: No   Assistance Recommended at Discharge Frequent or constant Supervision/Assistance  Patient can return home with the following A lot of help with walking and/or transfers;A lot of help with bathing/dressing/bathroom;Assist for transportation;Help with stairs or ramp for entrance;Assistance with cooking/housework   Equipment Recommendations  None recommended by PT (TBD in next venue)     Recommendations for Other Services Rehab consult     Precautions / Restrictions Precautions Precautions: Fall Precaution Comments: dizziness with positional changes Restrictions Weight Bearing Restrictions: No     Mobility  Bed Mobility Overal bed mobility: Needs Assistance Bed Mobility: Supine to Sit, Sit to Supine     Supine to sit: Mod assist, HOB elevated Sit to supine: Mod assist   General bed mobility comments: Pt is able to move BLE towards EOB during supine>sit but requires assistance to upright trunk. Sit>supine with mod assist assist.    Transfers Overall transfer level: Needs assistance Equipment used: None Transfers: Bed to chair/wheelchair/BSC Sit to Stand: Mod assist, Max assist           General transfer comment: Pt performs stand pivot to R with mod assist, max assist for pivot to L. PT provides cuing re: hand placement, sequencing & technique. Pt requires assistance for pivoting.    Ambulation/Gait                   Stairs             Wheelchair Mobility    Modified Rankin (Stroke Patients Only)       Balance Overall balance assessment: Needs assistance Sitting-balance support: Feet supported, Bilateral upper extremity supported Sitting balance-Leahy Scale: Fair                                      Cognition Arousal/Alertness: Awake/alert Behavior During Therapy: WFL for tasks assessed/performed Overall Cognitive Status: No family/caregiver present to determine baseline cognitive functioning  General Comments: Pt pleasant & agreeable to tx, appreciative of session, follows simple commands with slightly extra time during session.        Exercises      General Comments        Pertinent Vitals/Pain Pain Assessment Pain Assessment: Faces Faces Pain Scale: No hurt    Home Living                          Prior Function            PT  Goals (current goals can now be found in the care plan section) Acute Rehab PT Goals Patient Stated Goal: to feel better PT Goal Formulation: With patient Time For Goal Achievement: 05/31/22 Potential to Achieve Goals: Fair Progress towards PT goals: Progressing toward goals    Frequency    Min 2X/week      PT Plan Current plan remains appropriate    Co-evaluation              AM-PAC PT "6 Clicks" Mobility   Outcome Measure  Help needed turning from your back to your side while in a flat bed without using bedrails?: A Little Help needed moving from lying on your back to sitting on the side of a flat bed without using bedrails?: A Lot Help needed moving to and from a bed to a chair (including a wheelchair)?: A Lot Help needed standing up from a chair using your arms (e.g., wheelchair or bedside chair)?: A Lot Help needed to walk in hospital room?: Total Help needed climbing 3-5 steps with a railing? : Total 6 Click Score: 11    End of Session   Activity Tolerance:  (limited by dizziness) Patient left: in bed;with call bell/phone within reach;with bed alarm set   PT Visit Diagnosis: Muscle weakness (generalized) (M62.81);Difficulty in walking, not elsewhere classified (R26.2)     Time: 1342-1400 PT Time Calculation (min) (ACUTE ONLY): 18 min  Charges:  $Therapeutic Activity: 8-22 mins                     Lavone Nian, PT, DPT 05/21/22, 2:10 PM   Waunita Schooner 05/21/2022, 2:08 PM

## 2022-05-21 NOTE — TOC Transition Note (Signed)
Transition of Care [facility]) - CM/SW Discharge Note   Patient Details  Name: KALYA KOEPP MRN: [id number] Date of Birth: 07/21/1930  Transition of Care [facility]) CM/SW Contact:  Beverly Sessions, RN Phone Number: 05/21/2022, 4:19 PM   Clinical Narrative:     Patient will DC to: [facility] Anticipated DC date:05/21/22\ Family notified: Husband is aware  Transport [id number]  Per MD patient ready for DC to . RN, patient, patient's family, and facility notified of DC. Discharge Summary sent to facility. RN given number for report. DC packet on chart. Ambulance transport requested for patient bed unit director   [place] signing off.  Isaias Cowman [facility] [phone]         Patient Goals and CMS Choice      Discharge Placement                         Discharge Plan and Services Additional resources added to the After Visit Summary for                                       Social Determinants of Health (SDOH) Interventions SDOH Screenings   Food Insecurity: No Food Insecurity (05/15/2022)  Housing: [place]  (05/15/2022)  Transportation Needs: No Transportation Needs (05/15/2022)  Utilities: Not At Risk (05/15/2022)  Tobacco Use: Low Risk  (05/16/2022)     Readmission Risk Interventions    01/07/2022    2:50 PM  Readmission Risk Prevention Plan  Post Dischage Appt Complete  Medication Screening Complete  Transportation Screening Complete

## 2022-05-24 ENCOUNTER — Non-Acute Institutional Stay (SKILLED_NURSING_FACILITY): Payer: Medicare Other | Admitting: Student

## 2022-05-24 ENCOUNTER — Encounter: Payer: Self-pay | Admitting: Student

## 2022-05-24 DIAGNOSIS — K922 Gastrointestinal hemorrhage, unspecified: Secondary | ICD-10-CM | POA: Diagnosis not present

## 2022-05-24 DIAGNOSIS — I10 Essential (primary) hypertension: Secondary | ICD-10-CM

## 2022-05-24 DIAGNOSIS — Z8673 Personal history of transient ischemic attack (TIA), and cerebral infarction without residual deficits: Secondary | ICD-10-CM | POA: Diagnosis not present

## 2022-05-24 DIAGNOSIS — R112 Nausea with vomiting, unspecified: Secondary | ICD-10-CM | POA: Diagnosis not present

## 2022-05-24 DIAGNOSIS — N390 Urinary tract infection, site not specified: Secondary | ICD-10-CM

## 2022-05-24 DIAGNOSIS — I69398 Other sequelae of cerebral infarction: Secondary | ICD-10-CM

## 2022-05-24 DIAGNOSIS — R42 Dizziness and giddiness: Secondary | ICD-10-CM

## 2022-05-24 NOTE — Progress Notes (Unsigned)
Provider:  Dr. Dewayne Shorter Location:  Other [place].  Nursing Home Room Number: [facility] 116A Place of Service:  SNF (31)  PCP: Rusty Aus, MD Patient Care Team: Rusty Aus, MD as PCP - General (Internal Medicine) [name] November, MD as Consulting Physician (Cardiology)  Extended Emergency Contact Information Primary Emergency Contact: Scarfo,james Sr. Home Phone: [phone] Mobile Phone: [phone] Relation: Spouse Secondary Emergency Contact: [place] Phone: [phone] Mobile Phone: [phone] Relation: Son  Code Status: DNR (MOST Form scanned in [place]) Goals of Care: Advanced Directive information    05/24/2022    8:29 AM  Advanced Directives  Does Patient Have a Medical Advance Directive? Yes  Type of Advance Directive Out of facility DNR (pink MOST or yellow form)  Does patient want to make changes to medical advance directive? No - Patient declined      Chief Complaint  Patient presents with   New Admit To SNF    Admission.     HPI: Patient is a 89 y.o. female seen today for admission to [facility] after readmission to the hospital for nausea and dizziness, found to have PICA stroke and complicated UTI with foley catheter.   She doesn't remember whether she ahd breakfast. She knows we are in [place] and it is march 2024. She hesitantly answers questions and continues to say, "well I just got here yesterday."  She lives at home with her husband Sherry [name]. She has a walker at home but she doesn't use it often. She walks fine without it.   She enjoys movies, walking, golf. She plays a lot of golf when the weather is nice.  Past Medical History:  Diagnosis Date   Actinic keratosis    Breast cancer ([place])    C1 cervical fracture ([place])    Hypertension    Osteoarthritis    Past Surgical History:  Procedure Laterality Date   BACK SURGERY     BIOPSY  05/03/2022   Procedure: BIOPSY;  Surgeon: Yetta Flock, MD;   Location: [facility] ENDOSCOPY;  Service: Gastroenterology;;   ESOPHAGOGASTRODUODENOSCOPY (EGD) WITH PROPOFOL N/A 05/03/2022   Procedure: ESOPHAGOGASTRODUODENOSCOPY (EGD) WITH PROPOFOL;  Surgeon: Yetta Flock, MD;  Location: [facility];  Service: Gastroenterology;  Laterality: N/A;   HEMOSTASIS CONTROL  05/03/2022   Procedure: HEMOSTASIS CONTROL;  Surgeon: Yetta Flock, MD;  Location: [facility] ENDOSCOPY;  Service: Gastroenterology;;   IR INJECT/THERA/INC NEEDLE/CATH/PLC EPI/LUMB/SAC [facility]  04/29/2021   MOHS SURGERY      reports that she has never smoked. She has never been exposed to tobacco smoke. She has never used smokeless tobacco. She reports current alcohol use. She reports that she does not use drugs. Social History   Socioeconomic History   Marital status: Married    Spouse name: Not on file   Number of children: Not on file   Years of education: Not on file   Highest education level: Not on file  Occupational History   Not on file  Tobacco Use   Smoking status: Never    Passive exposure: Never   Smokeless tobacco: Never  Vaping Use   Vaping Use: Never used  Substance and Sexual Activity   Alcohol use: Yes    Comment: Occassionally   Drug use: No   Sexual activity: Not on file  Other Topics Concern   Not on file  Social History Narrative   Not on file   Social Determinants of Health   Financial Resource Strain: Not on file  Food Insecurity: No Food Insecurity (05/15/2022)   Hunger Vital Sign    Worried About Running Out of Food in the Last Year: Never true    Ran Out of Food in the Last Year: Never true  Transportation Needs: No Transportation Needs (05/15/2022)   PRAPARE - Hydrologist (Medical): No    Lack of Transportation (Non-Medical): No  Physical Activity: Not on file  Stress: Not on file  Social Connections: Not on file  Intimate Partner Violence: Not At Risk (05/15/2022)   Humiliation, Afraid, Rape, and Kick questionnaire     Fear of Current or Ex-Partner: No    Emotionally Abused: No    Physically Abused: No    Sexually Abused: No    Functional Status Survey:    Family History  Problem Relation Age of Onset   Hypertension Father     Health Maintenance  Topic Date Due   Medicare Annual Wellness (AWV)  Never done   DEXA SCAN  Never done   Pneumonia Vaccine 76+ Years old (2 of 2 - PPSV23 or PCV20) 01/05/2018   COVID-19 Vaccine (4 - 2023-24 season) 10/30/2021   DTaP/Tdap/Td (3 - Td or Tdap) 07/15/2029   INFLUENZA VACCINE  Completed   Zoster Vaccines- Shingrix  Completed   HPV VACCINES  Aged Out    Allergies  Allergen Reactions   Morphine     Other reaction(s): Other (See Comments) Apnea with large dose Respiratory Distress    Buprenorphine Hcl     Other reaction(s): Other (See Comments), Other (See Comments) Respiratory Distress Respiratory Distress    Codeine    Epinephrine     Other reaction(s): Other (See Comments) Severe anxiety; "makes my heart race" - would like to avoid during surgery   Morphine And Related Other (See Comments)    Respiratory Distress    Percocet [Oxycodone-Acetaminophen]     Outpatient Encounter Medications as of 05/24/2022  Medication Sig   acetaminophen (TYLENOL) 325 MG tablet Take 2 tablets (650 mg total) by mouth every 6 (six) hours as needed for headache.   aspirin EC 81 MG tablet Take 1 tablet (81 mg total) by mouth daily. Swallow whole.   atorvastatin (LIPITOR) 40 MG tablet Take 1 tablet (40 mg total) by mouth daily.   cholecalciferol (VITAMIN D) 25 MCG (1000 UNIT) tablet Take 2,000 Units by mouth daily.   clopidogrel (PLAVIX) 75 MG tablet Take 1 tablet (75 mg total) by mouth daily.   Cyanocobalamin (B-12) 1000 MCG CAPS Take 2,000 mcg by mouth daily.   hydrALAZINE (APRESOLINE) 50 MG tablet Take 50 mg by mouth every 8 (eight) hours as needed.   lactose free nutrition (BOOST) LIQD Take 237 mLs by mouth 3 (three) times daily between meals.   metoCLOPramide  (REGLAN) 5 MG tablet Take 5 mg by mouth every 6 (six) hours as needed for nausea.   ondansetron (ZOFRAN) 4 MG tablet Take one tablet by mouth every 6 hours as needed. Give one tablet by mouth every 8 hours for 2 days. Give one tablet by mouth twice daily for 2 days.   pantoprazole (PROTONIX) 40 MG tablet Take 1 tablet (40 mg total) by mouth 2 (two) times daily for 21 days, THEN 1 tablet (40 mg total) daily.   prochlorperazine (COMPAZINE) 5 MG tablet Take 5 mg by mouth every 6 (six) hours as needed for nausea or vomiting.   senna-docusate (SENOKOT-S) 8.6-50 MG tablet Take 1 tablet by mouth 2 (two) times daily.  tamsulosin (FLOMAX) 0.4 MG CAPS capsule Take 1 capsule (0.4 mg total) by mouth daily.   No facility-administered encounter medications on file as of 05/24/2022.    Review of Systems  Vitals:   05/24/22 0811  BP: 128/76  Pulse: 93  Resp: 20  Temp: (!) 97.1 F (36.2 C)  SpO2: 95%  Weight: 119 lb 1.6 oz (54 kg)  Height: 5\' 2"  (1.575 m)   Body mass index is 21.78 kg/m. Physical Exam Constitutional:      Comments: Chronically ill-appearing.  HENT:     Head: Normocephalic and atraumatic.     Nose: Nose normal.     Mouth/Throat:     Mouth: Mucous membranes are moist.  Eyes:     Pupils: Pupils are equal, round, and reactive to light.  Cardiovascular:     Rate and Rhythm: Bradycardia present.     Pulses: Normal pulses.  Pulmonary:     Effort: Pulmonary effort is normal.     Breath sounds: Normal breath sounds.  Abdominal:     General: Bowel sounds are normal.     Palpations: Abdomen is soft.  Skin:    General: Skin is warm and dry.  Neurological:     Mental Status: She is alert.     Comments: 4/5 Bilateral grip strength. Slightly weaker in left than right. Finger to nose intact on the right, severely impaired on the left. Bilateral legs 4/5 strength. Face symmetric. Smiling. Oriented to self, needs help and clarification on situation     Labs reviewed: Basic  Metabolic Panel: Recent Labs    04/26/22 0320 05/01/22 1251 05/06/22 0842 05/07/22 1052 05/08/22 0425 05/10/22 0657 05/16/22 0418 05/17/22 0434 05/19/22 1028  NA 133*   < > 134* 134* 135   < > 135 135 138  K 3.5   < > 3.8 3.5 3.8   < > 3.8 3.3* 3.7  CL 97*   < > 101 100 103   < > 102 103 103  CO2 22   < > 24 23 23    < > 24 22 23   GLUCOSE 131*   < > 105* 142* 100*   < > 86 102* 152*  BUN 19   < > 7* 13 10   < > 15 18 25*  CREATININE 0.64   < > 0.60 0.72 0.54   < > 0.57 0.58 0.49  CALCIUM 8.9   < > 8.4* 8.5* 8.5*   < > 9.2 8.9 9.2  MG 1.4*  --  1.3* 1.9 1.8  --   --   --   --   PHOS 3.0  --   --   --   --   --   --   --   --    < > = values in this interval not displayed.   Liver Function Tests: Recent Labs    01/08/22 0327 04/25/22 0948 05/15/22 1404  AST 15 26 23   ALT 14 23 15   ALKPHOS 52 43 62  BILITOT 0.4 0.9 0.5  PROT 5.2* 7.0 6.5  ALBUMIN 2.7* 4.1 3.0*   Recent Labs    04/25/22 0948  LIPASE 43   No results for input(s): "AMMONIA" in the last 8760 hours. CBC: Recent Labs    05/01/22 1251 05/02/22 0858 05/04/22 0803 05/05/22 0732 05/15/22 1404 05/16/22 0418 05/17/22 0434 05/19/22 1028  WBC 20.4*  21.0*   < > 13.4*   < > 7.8 7.0 7.2 8.2  NEUTROABS 13.6*  --  8.9*  --  5.0  --   --   --   HGB 7.8*  8.1*   < > 9.9*   < > 11.1* 11.2* 11.0* 11.5*  HCT 24.2*  23.3*   < > 28.5*   < > 34.5* 34.1* 32.9* 35.2*  MCV 93.4  90.0   < > 90.2   < > 92.7 91.2 90.1 91.2  PLT 344  331   < > 306   < > 537* 460* 451* 415*   < > = values in this interval not displayed.   Cardiac Enzymes: No results for input(s): "CKTOTAL", "CKMB", "CKMBINDEX", "TROPONINI" in the last 8760 hours. BNP: Invalid input(s): "POCBNP" Lab Results  Component Value Date   HGBA1C 5.6 04/26/2022   No results found for: "TSH" No results found for: "VITAMINB12" No results found for: "FOLATE" Lab Results  Component Value Date   IRON 46 05/16/2021   TIBC 319 05/16/2021   FERRITIN 168  05/16/2021    Imaging and Procedures obtained prior to SNF admission: CT Abdomen Pelvis W Contrast  Result Date: 05/15/2022 CLINICAL DATA:  Abdomen pain EXAM: CT ABDOMEN AND PELVIS WITH CONTRAST TECHNIQUE: Multidetector CT imaging of the abdomen and pelvis was performed using the standard protocol following bolus administration of intravenous contrast. RADIATION DOSE REDUCTION: This exam was performed according to the departmental dose-optimization program which includes automated exposure control, adjustment of the mA and/or kV according to patient size and/or use of iterative reconstruction technique. CONTRAST:  166mL OMNIPAQUE IOHEXOL 300 MG/ML  SOLN COMPARISON:  CT 05/02/2022, 05/17/2021 FINDINGS: Lower chest: Lung bases demonstrate small left greater than right pleural effusions which are new. Partial consolidations in the lower lobes favored to represent atelectasis. Mild scarring right middle lobe Hepatobiliary: Distended gallbladder with stones. No inflammatory change or biliary dilatation. Small cyst within the central liver. Pancreas: No inflammation. Stable 8 mm cyst at the uncinate process. Spleen: Normal in size without focal abnormality. Adrenals/Urinary Tract: Adrenal glands are normal. Kidneys show no hydronephrosis. The bladder contains Foley catheter and appears slightly thick-walled with mild mucosal enhancement. Stomach/Bowel: Stomach is nonenlarged. Thickening of the pylorus with mucosal enhancement. No dilated small bowel. Large feces in the colon and rectum. Vascular/Lymphatic: Moderate aortic atherosclerosis. No aneurysm. No suspicious lymph nodes. Reproductive: Status post hysterectomy. No adnexal masses. Other: Negative for pelvic effusion.  No free air. Musculoskeletal: No acute or suspicious osseous abnormality. Multilevel degenerative change with grade 1 anterolisthesis L5 on S1. Stable 1.8 cm nodule in the right inferior breast. IMPRESSION: 1. Small left greater than right pleural  effusions with partial consolidations in the lower lobes favored to represent atelectasis. 2. Distended gallbladder with stones but no inflammatory change or biliary dilatation. 3. Thickening of the pylorus with mucosal enhancement, suspicious for peptic ulcer disease. No evidence for perforation at this time. 4. Slightly thick-walled appearance of the urinary bladder with mild mucosal enhancement, correlate for cystitis. 5. Large volume of feces in the colon and rectum suggesting constipation. 6. Stable 1.8 cm right inferior breast nodule 7. Aortic atherosclerosis. Aortic Atherosclerosis (ICD10-I70.0). Electronically Signed   By: Donavan Foil M.D.   On: 05/15/2022 17:15   DG Chest Port 1 View  Result Date: 05/15/2022 CLINICAL DATA:  Weakness EXAM: PORTABLE CHEST 1 VIEW COMPARISON:  Radiograph 05/01/2022 FINDINGS: Unchanged cardiomediastinal silhouette. There is no new focal airspace consolidation. There is no pleural effusion or evidence of pneumothorax. Bilateral shoulder degenerative changes with unchanged osteochondral joint bodies. Thoracic spondylosis. IMPRESSION: No evidence of acute  cardiopulmonary disease. Electronically Signed   By: Maurine Simmering M.D.   On: 05/15/2022 15:20   CT Head Wo Contrast  Result Date: 05/15/2022 CLINICAL DATA:  Concern for acute stroke EXAM: CT HEAD WITHOUT CONTRAST TECHNIQUE: Contiguous axial images were obtained from the base of the skull through the vertex without intravenous contrast. RADIATION DOSE REDUCTION: This exam was performed according to the departmental dose-optimization program which includes automated exposure control, adjustment of the mA and/or kV according to patient size and/or use of iterative reconstruction technique. COMPARISON:  None Available. FINDINGS: Brain: No acute intracranial hemorrhage. No focal mass lesion. No CT evidence of acute infarction. No midline shift or mass effect. No hydrocephalus. Basilar cisterns are patent. There are  periventricular and subcortical white matter hypodensities. Generalized cortical atrophy. Vascular: No hyperdense vessel or unexpected calcification. Skull: Normal. Negative for fracture or focal lesion. Sinuses/Orbits: Paranasal sinuses and mastoid air cells are clear. Orbits are clear. Other: None. IMPRESSION: 1. No acute intracranial findings. 2. Atrophy and white matter microvascular disease. Electronically Signed   By: Suzy Bouchard M.D.   On: 05/15/2022 15:16    Assessment/Plan 1. H/O ischemic vertebrobasilar artery cerebellar stroke Patient with readmission to nursing facility after patient was found to have a second stroke on MRI. Residual left-sided dis coordination as a result of CVA. Previously deferred antiplatelet therapy due to acute GI bleed.   2. Vertigo following cerebrovascular accident Patient states symptoms have improved at this time. Continue to monitor for changes. Compazine BID PRN  3. Essential hypertension BP well-controlled at this time.   4. Upper GI bleed Noted during first hospitalization. Stable hemoglobin. Recheck In 1 week.   5. Nausea and vomiting, unspecified vomiting type Symptoms improved at this time. Likely secondary to stroke. Zofran and reglan for nausea, compazine as well for third line option of nausea.   6. Complicated UTI (urinary tract infection) Patient found to have UTI while inpatient. S/p antibiotics. Continue flomax for urinary retention. Follow up with urology outpatient.     Family/ staff Communication: Nursing  Labs/tests ordered: CBC, BMP

## 2022-05-25 ENCOUNTER — Non-Acute Institutional Stay (SKILLED_NURSING_FACILITY): Payer: Medicare Other | Admitting: Nurse Practitioner

## 2022-05-25 ENCOUNTER — Encounter: Payer: Self-pay | Admitting: Nurse Practitioner

## 2022-05-25 DIAGNOSIS — R519 Headache, unspecified: Secondary | ICD-10-CM

## 2022-05-25 DIAGNOSIS — Z8673 Personal history of transient ischemic attack (TIA), and cerebral infarction without residual deficits: Secondary | ICD-10-CM | POA: Insufficient documentation

## 2022-05-25 DIAGNOSIS — I10 Essential (primary) hypertension: Secondary | ICD-10-CM

## 2022-05-25 DIAGNOSIS — R112 Nausea with vomiting, unspecified: Secondary | ICD-10-CM

## 2022-05-25 MED ORDER — PROPRANOLOL HCL 40 MG PO TABS: 40.0000 mg | ORAL_TABLET | Freq: Two times a day (BID) | ORAL | Status: DC

## 2022-05-25 NOTE — Progress Notes (Signed)
Location:  Other [facility]) Nursing Home Room Number: [place] of Service:  SNF ([phone])  Rusty Aus, MD  Patient Care Team: Rusty Aus, MD as PCP - General (Internal Medicine) Minna Merritts, MD as Consulting Physician (Cardiology)  Extended Emergency Contact Information Primary Emergency Contact: Hickox,james Sr. Home Phone: [phone] Mobile Phone: [phone] Relation: Spouse Secondary Emergency Contact: [place] Phone: [phone] Mobile Phone: [phone] Relation: Son  Goals of care: Advanced Directive information    05/25/2022    1:25 PM  Advanced Directives  Does Patient Have a Medical Advance Directive? Yes  Type of Advance Directive Out of facility DNR (pink MOST or yellow form)  Does patient want to make changes to medical advance directive? No - Patient declined  Pre-existing out of facility DNR order (yellow form or pink MOST form) Pink MOST form placed in chart (order not valid for inpatient use)     Chief Complaint  Patient presents with   Acute Visit    Headache     HPI:  Pt is a 89 y.o. female seen today for headache and nausea.  She recently was discharged from the hospital due to UTI and has acute CVA. No deficits from stroke. Continues to have a headache, took tylenol with some relief. No blurred vision, slurred speech.no dizziness, facial droop or weakness.   She reports she has had a hx of htn but never on any medication.  Denies chest pains, shortness of breaths or palpitation.   Nausea has improved from this morning and she has been able to eat lunch.  No vomiting.  No diarrhea or constipation.    Past Medical History:  Diagnosis Date   Actinic keratosis    Breast cancer ([place])    C1 cervical fracture ([place])    Hypertension    Osteoarthritis    Past Surgical History:  Procedure Laterality Date   BACK SURGERY     BIOPSY  05/03/2022   Procedure: BIOPSY;  Surgeon: Yetta Flock, MD;  Location: [facility]  ENDOSCOPY;  Service: Gastroenterology;;   ESOPHAGOGASTRODUODENOSCOPY (EGD) WITH PROPOFOL N/A 05/03/2022   Procedure: ESOPHAGOGASTRODUODENOSCOPY (EGD) WITH PROPOFOL;  Surgeon: Yetta Flock, MD;  Location: [place];  Service: Gastroenterology;  Laterality: N/A;   HEMOSTASIS CONTROL  05/03/2022   Procedure: HEMOSTASIS CONTROL;  Surgeon: Yetta Flock, MD;  Location: [place];  Service: Gastroenterology;;   IR INJECT/THERA/INC NEEDLE/CATH/PLC EPI/LUMB/SAC [facility]  04/29/2021   MOHS SURGERY      Allergies  Allergen Reactions   Morphine     Other reaction(s): Other (See Comments) Apnea with large dose Respiratory Distress    Buprenorphine Hcl     Other reaction(s): Other (See Comments), Other (See Comments) Respiratory Distress Respiratory Distress    Codeine    Epinephrine     Other reaction(s): Other (See Comments) Severe anxiety; "makes my heart race" - would like to avoid during surgery   Morphine And Related Other (See Comments)    Respiratory Distress    Percocet [Oxycodone-Acetaminophen]     Outpatient Encounter Medications as of 05/25/2022  Medication Sig   acetaminophen (TYLENOL) 325 MG tablet Take 2 tablets (650 mg total) by mouth every 6 (six) hours as needed for headache.   aspirin EC 81 MG tablet Take 1 tablet (81 mg total) by mouth daily. Swallow whole.   atorvastatin (LIPITOR) 40 MG tablet Take 1 tablet (40 mg total) by mouth daily.   cholecalciferol (VITAMIN D) 25 MCG (1000 UNIT) tablet Take 2,000  Units by mouth daily.   clopidogrel (PLAVIX) 75 MG tablet Take 1 tablet (75 mg total) by mouth daily.   Cyanocobalamin (B-12) 1000 MCG CAPS Take 2,000 mcg by mouth daily.   hydrALAZINE (APRESOLINE) 50 MG tablet Take 50 mg by mouth every 8 (eight) hours as needed.   lactose free nutrition (BOOST) LIQD Take 237 mLs by mouth 3 (three) times daily between meals.   metoCLOPramide (REGLAN) 5 MG tablet Take 5 mg by mouth every 6 (six) hours as needed for nausea.    ondansetron (ZOFRAN) 4 MG tablet Take one tablet by mouth every 6 hours as needed. Give one tablet by mouth every 8 hours for 2 days. Give one tablet by mouth twice daily for 2 days.   pantoprazole (PROTONIX) 40 MG tablet Take 1 tablet (40 mg total) by mouth 2 (two) times daily for 21 days, THEN 1 tablet (40 mg total) daily.   prochlorperazine (COMPAZINE) 5 MG tablet Take 5 mg by mouth every 6 (six) hours as needed for nausea or vomiting.   propranolol (INDERAL) 40 MG tablet Take 1 tablet (40 mg total) by mouth 2 (two) times daily.   senna-docusate (SENOKOT-S) 8.6-50 MG tablet Take 1 tablet by mouth 2 (two) times daily.   tamsulosin (FLOMAX) 0.4 MG CAPS capsule Take 1 capsule (0.4 mg total) by mouth daily.   No facility-administered encounter medications on file as of 05/25/2022.    Review of Systems  Constitutional:  Positive for fatigue. Negative for activity change, appetite change and unexpected weight change.  HENT:  Negative for congestion and hearing loss.   Eyes: Negative.   Respiratory:  Negative for cough and shortness of breath.   Cardiovascular:  Negative for chest pain, palpitations and leg swelling.  Gastrointestinal:  Positive for nausea. Negative for abdominal pain, constipation and diarrhea.  Genitourinary:  Negative for difficulty urinating and dysuria.  Musculoskeletal:  Negative for arthralgias and myalgias.  Skin:  Negative for color change and wound.  Neurological:  Positive for headaches. Negative for dizziness and weakness.  Psychiatric/Behavioral:  Positive for confusion. Negative for agitation and behavioral problems.      Immunization History  Administered Date(s) Administered   Influenza Inj Mdck Quad Pf 11/30/2021   Influenza, High Dose Seasonal PF 12/16/2020   Influenza-Unspecified 12/06/2011, 11/27/2012, 01/19/2014, 12/11/2014, 12/03/2015, 12/10/2016, 12/18/2019   PFIZER Comirnaty(Gray Top)Covid-19 Tri-Sucrose Vaccine 03/13/2019, 04/03/2019, 12/05/2019    Pneumococcal Conjugate-13 12/31/2016, 01/05/2017   Td (Adult),5 Lf Tetanus Toxid, Preservative Free 07/16/2019   Tdap 06/18/2011   Zoster Recombinat (Shingrix) 06/30/2016, 10/26/2017, 01/06/2018   Pertinent  Health Maintenance Due  Topic Date Due   DEXA SCAN  Never done   INFLUENZA VACCINE  Completed      01/08/2022    9:00 AM 01/08/2022    7:58 PM 01/09/2022    9:00 AM 01/09/2022    8:25 PM 01/10/2022    8:50 AM  Fall Risk  (RETIRED) Patient Fall Risk Level High fall risk Moderate fall risk High fall risk High fall risk High fall risk   Functional Status Survey:    Vitals:   05/25/22 1319 05/25/22 1325  BP: (!) 151/69 (!) 180/75  Pulse: 94   Resp: 18   Temp: (!) 97.5 F (36.4 C)   SpO2: 99%   Weight: 119 lb (54 kg)   Height: 5\' 2"  (1.575 m)    Body mass index is 21.77 kg/m. Physical Exam Constitutional:      General: She is not in acute distress.  Appearance: She is well-developed. She is not diaphoretic.  HENT:     Head: Normocephalic and atraumatic.     Mouth/Throat:     Pharynx: No oropharyngeal exudate.  Eyes:     Conjunctiva/sclera: Conjunctivae normal.     Pupils: Pupils are equal, round, and reactive to light.  Cardiovascular:     Rate and Rhythm: Normal rate and regular rhythm.     Heart sounds: Normal heart sounds.  Pulmonary:     Effort: Pulmonary effort is normal.     Breath sounds: Normal breath sounds.  Abdominal:     General: Bowel sounds are normal.     Palpations: Abdomen is soft.  Musculoskeletal:     Cervical back: Normal range of motion and neck supple.     Right lower leg: No edema.     Left lower leg: No edema.  Skin:    General: Skin is warm and dry.  Neurological:     Mental Status: She is alert.     Comments: Oriented to person, place and time. Noted to have some issues with memory and short term memory loss.   Psychiatric:        Mood and Affect: Mood normal.     Labs reviewed: Recent Labs    04/26/22 0320  05/01/22 1251 05/06/22 0842 05/07/22 1052 05/08/22 0425 05/10/22 0657 05/16/22 0418 05/17/22 0434 05/19/22 1028  NA 133*   < > 134* 134* 135   < > 135 135 138  K 3.5   < > 3.8 3.5 3.8   < > 3.8 3.3* 3.7  CL 97*   < > 101 100 103   < > 102 103 103  CO2 22   < > 24 23 23    < > 24 22 23   GLUCOSE 131*   < > 105* 142* 100*   < > 86 102* 152*  BUN 19   < > 7* 13 10   < > 15 18 25*  CREATININE 0.64   < > 0.60 0.72 0.54   < > 0.57 0.58 0.49  CALCIUM 8.9   < > 8.4* 8.5* 8.5*   < > 9.2 8.9 9.2  MG 1.4*  --  1.3* 1.9 1.8  --   --   --   --   PHOS 3.0  --   --   --   --   --   --   --   --    < > = values in this interval not displayed.   Recent Labs    01/08/22 0327 04/25/22 0948 05/15/22 1404  AST 15 26 23   ALT 14 23 15   ALKPHOS 52 43 62  BILITOT 0.4 0.9 0.5  PROT 5.2* 7.0 6.5  ALBUMIN 2.7* 4.1 3.0*   Recent Labs    05/01/22 1251 05/02/22 0858 05/04/22 0803 05/05/22 0732 05/15/22 1404 05/16/22 0418 05/17/22 0434 05/19/22 1028  WBC 20.4*  21.0*   < > 13.4*   < > 7.8 7.0 7.2 8.2  NEUTROABS 13.6*  --  8.9*  --  5.0  --   --   --   HGB 7.8*  8.1*   < > 9.9*   < > 11.1* 11.2* 11.0* 11.5*  HCT 24.2*  23.3*   < > 28.5*   < > 34.5* 34.1* 32.9* 35.2*  MCV 93.4  90.0   < > 90.2   < > 92.7 91.2 90.1 91.2  PLT 344  331   < >  306   < > 537* 460* 451* 415*   < > = values in this interval not displayed.   No results found for: "TSH" Lab Results  Component Value Date   HGBA1C 5.6 04/26/2022   Lab Results  Component Value Date   CHOL 233 (H) 04/26/2022   HDL 53 04/26/2022   LDLCALC 122 (H) 04/26/2022   LDLDIRECT 114 (H) 04/26/2022   TRIG 292 (H) 04/26/2022   CHOLHDL 4.4 04/26/2022    Significant Diagnostic Results in last 30 days:  MR BRAIN WO CONTRAST  Result Date: 05/18/2022 CLINICAL DATA:  Provided history: Recent stroke. Ongoing vertigo. History of breast cancer. 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 EXAM: MRI HEAD WITHOUT CONTRAST  TECHNIQUE: Multiplanar, multiecho pulse sequences of the brain and surrounding structures were obtained without intravenous contrast. COMPARISON:  Prior head CT examinations 05/15/2022 and earlier. Brain MRI 04/25/2022. FINDINGS: Brain: Mild generalized parenchymal atrophy. New from the prior brain MRI of 04/25/2022, there is a 4 mm acute infarct within the left dorsolateral aspect of the medulla (series 5, image 6). Known moderately large late subacute left PICA territory infarct within the left cerebellar hemisphere. Redemonstrated small focus of chronic encephalomalacia/gliosis within the anterior right frontal lobe, which may be posttraumatic in etiology or may reflect a chronic infarct. Redemonstrated small chronic cortical infarct within the right occipital lobe. Background moderate multifocal T2 FLAIR hyperintense signal abnormality within the cerebral white matter, nonspecific but compatible with chronic small vessel ischemic disease. Redemonstrated chronic lacunar infarcts within the left basal ganglia/internal capsule. Punctate chronic microhemorrhage within the left frontoparietal white matter. No evidence of an intracranial mass. No extra-axial fluid collection. No midline shift. Vascular: Signal abnormality within the intracranial left vertebral artery compatible with known vessel occlusion. Flow voids preserved elsewhere within the proximal large arterial vessels. 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 Incompletely assessed cervical spondylosis. Sinuses/Orbits: No mass or acute finding within the imaged orbits. Prior bilateral ocular lens replacement. Mucous retention cyst within a posterior right ethmoid air cell. Small mucous retention cyst within the left sphenoid sinus. Other: Right mastoid effusion. Trace fluid also present within the left mastoid air cells. Impression #1 will be called to the ordering clinician or representative by the  Radiologist Assistant, and communication documented in the PACS or [employer]. IMPRESSION: 1. 4 mm acute infarct within the left dorsolateral aspect of the medulla, new from the prior brain MRI of 04/25/2022. 2. Known moderately large late subacute left PICA territory infarct within the left cerebellar hemisphere. 3. Redemonstrated small focus of chronic encephalomalacia/gliosis within the anterior right frontal lobe, which may be posttraumatic in etiology or may reflect a chronic infarct. 4. Redemonstrated small chronic cortically-based infarct within the right occipital lobe. 5. Background parenchymal atrophy and chronic small vessel ischemic disease with chronic small-vessel infarcts, as described. 6. Signal abnormality within the intracranial left vertebral artery compatible with known vessel occlusion. 7. Known chronic nonunited type 2 dens fracture. 8. Paranasal sinus disease, as outlined. 9. Right mastoid effusion. Electronically Signed   By: Kellie Simmering D.O.   On: 05/18/2022 12:46   CT Abdomen Pelvis W Contrast  Result Date: 05/15/2022 CLINICAL DATA:  Abdomen pain EXAM: CT ABDOMEN AND PELVIS WITH CONTRAST TECHNIQUE: Multidetector CT imaging of the abdomen and pelvis was performed using the standard protocol following bolus administration of intravenous contrast. RADIATION DOSE REDUCTION: This exam was performed according to the departmental dose-optimization program which includes automated  exposure control, adjustment of the mA and/or kV according to patient size and/or use of iterative reconstruction technique. CONTRAST:  1106mL OMNIPAQUE IOHEXOL 300 MG/ML  SOLN COMPARISON:  CT 05/02/2022, 05/17/2021 FINDINGS: Lower chest: Lung bases demonstrate small left greater than right pleural effusions which are new. Partial consolidations in the lower lobes favored to represent atelectasis. Mild scarring right middle lobe Hepatobiliary: Distended gallbladder with stones. No inflammatory change or biliary  dilatation. Small cyst within the central liver. Pancreas: No inflammation. Stable 8 mm cyst at the uncinate process. Spleen: Normal in size without focal abnormality. Adrenals/Urinary Tract: Adrenal glands are normal. Kidneys show no hydronephrosis. The bladder contains Foley catheter and appears slightly thick-walled with mild mucosal enhancement. Stomach/Bowel: Stomach is nonenlarged. Thickening of the pylorus with mucosal enhancement. No dilated small bowel. Large feces in the colon and rectum. Vascular/Lymphatic: Moderate aortic atherosclerosis. No aneurysm. No suspicious lymph nodes. Reproductive: Status post hysterectomy. No adnexal masses. Other: Negative for pelvic effusion.  No free air. Musculoskeletal: No acute or suspicious osseous abnormality. Multilevel degenerative change with grade 1 anterolisthesis L5 on S1. Stable 1.8 cm nodule in the right inferior breast. IMPRESSION: 1. Small left greater than right pleural effusions with partial consolidations in the lower lobes favored to represent atelectasis. 2. Distended gallbladder with stones but no inflammatory change or biliary dilatation. 3. Thickening of the pylorus with mucosal enhancement, suspicious for peptic ulcer disease. No evidence for perforation at this time. 4. Slightly thick-walled appearance of the urinary bladder with mild mucosal enhancement, correlate for cystitis. 5. Large volume of feces in the colon and rectum suggesting constipation. 6. Stable 1.8 cm right inferior breast nodule 7. Aortic atherosclerosis. Aortic Atherosclerosis (ICD10-I70.0). Electronically Signed   By: Donavan Foil M.D.   On: 05/15/2022 17:15   DG Chest Port 1 View  Result Date: 05/15/2022 CLINICAL DATA:  Weakness EXAM: PORTABLE CHEST 1 VIEW COMPARISON:  Radiograph 05/01/2022 FINDINGS: Unchanged cardiomediastinal silhouette. There is no new focal airspace consolidation. There is no pleural effusion or evidence of pneumothorax. Bilateral shoulder degenerative  changes with unchanged osteochondral joint bodies. Thoracic spondylosis. IMPRESSION: No evidence of acute cardiopulmonary disease. Electronically Signed   By: Maurine Simmering M.D.   On: 05/15/2022 15:20   CT Head Wo Contrast  Result Date: 05/15/2022 CLINICAL DATA:  Concern for acute stroke EXAM: CT HEAD WITHOUT CONTRAST TECHNIQUE: Contiguous axial images were obtained from the base of the skull through the vertex without intravenous contrast. RADIATION DOSE REDUCTION: This exam was performed according to the departmental dose-optimization program which includes automated exposure control, adjustment of the mA and/or kV according to patient size and/or use of iterative reconstruction technique. COMPARISON:  None Available. FINDINGS: Brain: No acute intracranial hemorrhage. No focal mass lesion. No CT evidence of acute infarction. No midline shift or mass effect. No hydrocephalus. Basilar cisterns are patent. There are periventricular and subcortical white matter hypodensities. Generalized cortical atrophy. Vascular: No hyperdense vessel or unexpected calcification. Skull: Normal. Negative for fracture or focal lesion. Sinuses/Orbits: Paranasal sinuses and mastoid air cells are clear. Orbits are clear. Other: None. IMPRESSION: 1. No acute intracranial findings. 2. Atrophy and white matter microvascular disease. Electronically Signed   By: Suzy Bouchard M.D.   On: 05/15/2022 15:16   DG Abd Portable 1V  Result Date: 05/05/2022 CLINICAL DATA:  Abdominal discomfort. EXAM: PORTABLE ABDOMEN - 1 VIEW COMPARISON:  05/22/2021 and CT 05/02/2022 FINDINGS: Calcifications in the right abdomen may be associated with the known gallstones. Lung bases are clear. Nonobstructive bowel gas  pattern. Surgical clip in the right hemipelvis. Limited evaluation for free air on the supine images. IMPRESSION: 1. Nonobstructive bowel gas pattern. 2. Cholelithiasis. Electronically Signed   By: Markus Daft M.D.   On: 05/05/2022 17:08   CT  ABDOMEN PELVIS W CONTRAST  Result Date: 05/02/2022 CLINICAL DATA:  Abdominal pain, acute. Nonlocalized. History of duodenal perforation. EXAM: CT ABDOMEN AND PELVIS WITH CONTRAST TECHNIQUE: Multidetector CT imaging of the abdomen and pelvis was performed using the standard protocol following bolus administration of intravenous contrast. RADIATION DOSE REDUCTION: This exam was performed according to the departmental dose-optimization program which includes automated exposure control, adjustment of the mA and/or kV according to patient size and/or use of iterative reconstruction technique. CONTRAST:  55mL OMNIPAQUE IOHEXOL 350 MG/ML SOLN COMPARISON:  05/17/2021 FINDINGS: Lower chest: No acute abnormality. Hepatobiliary: No suspicious liver abnormality. Small cyst within central left liver measures 0.8 cm and is unchanged from previous exam. Lobe of gallbladder appears distended. There are 2 no gallbladder wall thickening. No bile duct dilatation. Stones within the gallbladder measuring up to 2 cm. Pancreas: Cyst in uncinate process measures 8 mm and is unchanged from the previous exam. No main duct dilatation, inflammation or distension. Spleen: Normal in size without focal abnormality. Adrenals/Urinary Tract: Normal adrenal glands. No kidney mass, hydronephrosis, or nephrolithiasis. Urinary bladder is decompressed around a Foley catheter. Stomach/Bowel: There is wall thickening involving the pylorus and descending portion of the duodenum with surrounding soft tissue stranding. Transmural gas collection is identified within the anterior wall of the pylorus measuring 1.2 cm, image 34/3. On the previous exam this measures 1.1 cm. This may represent an area of transmural ulceration versus small duodenal diverticulum. No sign of perforation. The remaining small bowel loops are unremarkable. No pathologic dilatation of the large or small bowel loops. Sigmoid diverticulosis without acute diverticulitis. Moderate retained  stool noted within the colon. Vascular/Lymphatic: Aortic atherosclerosis. No enlarged abdominal or pelvic lymph nodes. Reproductive: Status post hysterectomy. No adnexal masses. Other: No free fluid or focal fluid collections. Musculoskeletal: Lumbar spondylosis and facet arthropathy. No acute or suspicious osseous findings. Postoperative changes from prior lumpectomy noted within the right breast. Soft tissue density nodule within the right lower breast measures 1.7 cm, image 18/3 previously this measured 2.0 cm. IMPRESSION: 1. There is wall thickening involving the pylorus and descending portion of the duodenum with surrounding soft tissue stranding. Transmural gas collection is identified within the anterior wall of the pylorus measuring 1.2 cm. On the previous exam this measures 1.1 cm. This may represent an area of transmural ulceration versus small duodenal diverticulum. No sign of perforation. 2. Gallstones with distended gallbladder. No gallbladder wall thickening. 3. Sigmoid diverticulosis without acute diverticulitis. 4. Moderate retained stool noted within the colon. Correlate for any clinical signs or symptoms of constipation. 5. Stable 8 mm cystic lesion in the uncinate process of the pancreas. Given the patient's age, no further workup or follow-up is recommended. 6. Soft tissue density nodule within the right lower breast measures 1.7 cm previously this measured 2.0 cm. 7.  Aortic Atherosclerosis (ICD10-I70.0). Electronically Signed   By: Kerby Moors M.D.   On: 05/02/2022 10:39   DG CHEST PORT 1 VIEW  Result Date: 05/01/2022 CLINICAL DATA:  Leukocytosis. EXAM: PORTABLE CHEST 1 VIEW COMPARISON:  Chest radiograph dated 04/25/2022. FINDINGS: No focal consolidation, pleural effusion, pneumothorax. Linear density along the lateral left lung most consistent with a skin fold. Repeat radiograph with repositioning of the patient may provide better evaluation. The cardiac  silhouette is within normal  limits. No acute osseous pathology. IMPRESSION: 1. No acute cardiopulmonary process. 2. Probable skin fold along the lateral left lung. Electronically Signed   By: Anner Crete M.D.   On: 05/01/2022 17:52   CT HEAD WO CONTRAST (5MM)  Result Date: 04/30/2022 CLINICAL DATA:  Stroke follow-up EXAM: CT HEAD WITHOUT CONTRAST TECHNIQUE: Contiguous axial images were obtained from the base of the skull through the vertex without intravenous contrast. RADIATION DOSE REDUCTION: This exam was performed according to the departmental dose-optimization program which includes automated exposure control, adjustment of the mA and/or kV according to patient size and/or use of iterative reconstruction technique. COMPARISON:  CT Head 04/26/22, MRI Head 04/25/22 FINDINGS: Brain: Evolving left cerebellar infarct. No evidence of hemorrhage. No CT evidence of a new infarct. Redemonstrated is a subacute right occipital lobe infarct and a chronic right frontal lobe infarct. There is also a chronic infarct in the left caudate head. Sequela of moderate chronic microvascular ischemic change. No hydrocephalus. No extra-axial fluid collection. Vascular: No hyperdense vessel or unexpected calcification. Skull: Normal. Negative for fracture or focal lesion. Sinuses/Orbits: No mastoid or middle ear effusion. Frothy secretions in the left sphenoid sinus in the posterior right ethmoid air cells, which can be seen in the setting of acute sinusitis. Bilateral lens replacement. Orbits are otherwise unremarkable. Other: None. IMPRESSION: Evolving left cerebellar infarct. No evidence of hemorrhage. No CT evidence of a new infarct. No hydrocephalus. Electronically Signed   By: Marin Roberts M.D.   On: 04/30/2022 12:38   ECHOCARDIOGRAM COMPLETE  Result Date: 04/26/2022    ECHOCARDIOGRAM REPORT   Patient Name:   SHAWNESE HURSTON Date of Exam: 04/26/2022 Medical Rec #:  [id number]         Height:       62.0 in Accession #:    [id number]        Weight:        123.9 lb Date of Birth:  [birth year]        BSA:          1.560 m Patient Age:    28 years          BP:           169/63 mmHg Patient Gender: F                 HR:           85 bpm. Exam Location:  Inpatient Procedure: 2D Echo, 3D Echo, Cardiac Doppler and Color Doppler Indications:    Stroke  History:        Patient has no prior history of Echocardiogram examinations.                 Abnormal ECG, Signs/Symptoms:Chest Pain and Dyspnea; Risk                 Factors:Hypertension and Dyslipidemia.  Sonographer:    Roseanna Rainbow RDCS Referring Phys: [id number] [place]  1. There is a mid cavity gradient of 61mmHg related to hyperdynamic LVF and mid cavity obliteration during systole. Left ventricular ejection fraction, by estimation, is 70 to 75%. Left ventricular ejection fraction by 3D volume is 72 %. The left ventricle has hyperdynamic function. The left ventricle has no regional wall motion abnormalities. There is severe left ventricular hypertrophy of the basal segment. Left ventricular diastolic parameters are consistent with Grade I diastolic dysfunction (impaired relaxation).  2. Right ventricular systolic function is normal. The right  ventricular size is normal. There is mildly elevated pulmonary artery systolic pressure. The estimated right ventricular systolic pressure is [id number] mmHg.  3. The mitral valve is normal in structure. Mild mitral valve regurgitation. No evidence of mitral stenosis.  4. The aortic valve is tricuspid. Aortic valve regurgitation is trivial. Aortic valve sclerosis/calcification is present, without any evidence of aortic stenosis. Aortic valve area, by VTI measures 3.65 cm. Aortic valve mean gradient measures 7.0 mmHg.  Aortic valve Vmax measures 1.79 m/s.  5. The inferior vena cava is normal in size with greater than 50% respiratory variability, suggesting right atrial pressure of 3 mmHg. Conclusion(s)/Recommendation(s): No intracardiac source of embolism detected on  this transthoracic study. Consider a transesophageal echocardiogram to exclude cardiac source of embolism if clinically indicated. FINDINGS  Left Ventricle: There is a mid cavity gradient of 51mmHg related to hyperdynamic LVF and mid cavity obliteration during systole. Left ventricular ejection fraction, by estimation, is 70 to 75%. Left ventricular ejection fraction by 3D volume is 72 %. The left ventricle has hyperdynamic function. The left ventricle has no regional wall motion abnormalities. The left ventricular internal cavity size was normal in size. There is severe left ventricular hypertrophy of the basal segment. Left ventricular diastolic parameters are consistent with Grade I diastolic dysfunction (impaired relaxation). Normal left ventricular filling pressure. Right Ventricle: The right ventricular size is normal. No increase in right ventricular wall thickness. Right ventricular systolic function is normal. There is mildly elevated pulmonary artery systolic pressure. The tricuspid regurgitant velocity is 2.92  m/s, and with an assumed right atrial pressure of 3 mmHg, the estimated right ventricular systolic pressure is [id number] mmHg. Left Atrium: Left atrial size was normal in size. Right Atrium: Right atrial size was normal in size. Pericardium: There is no evidence of pericardial effusion. Mitral Valve: The mitral valve is normal in structure. Mild mitral valve regurgitation. No evidence of mitral valve stenosis. MV peak gradient, 7.6 mmHg. The mean mitral valve gradient is 3.0 mmHg. Tricuspid Valve: The tricuspid valve is normal in structure. Tricuspid valve regurgitation is mild . No evidence of tricuspid stenosis. Aortic Valve: The aortic valve is tricuspid. Aortic valve regurgitation is trivial. Aortic valve sclerosis/calcification is present, without any evidence of aortic stenosis. Aortic valve mean gradient measures 7.0 mmHg. Aortic valve peak gradient measures 12.8 mmHg. Aortic valve area, by VTI  measures 3.65 cm. Pulmonic Valve: The pulmonic valve was normal in structure. Pulmonic valve regurgitation is not visualized. No evidence of pulmonic stenosis. Aorta: The aortic root is normal in size and structure. Venous: The inferior vena cava is normal in size with greater than 50% respiratory variability, suggesting right atrial pressure of 3 mmHg. IAS/Shunts: The interatrial septum appears to be lipomatous. No atrial level shunt detected by color flow Doppler.  LEFT VENTRICLE PLAX 2D LVIDd:         2.90 cm         Diastology LVIDs:         1.70 cm         LV e' medial:    5.11 cm/s LV PW:         1.10 cm         LV E/e' medial:  17.2 LV IVS:        1.60 cm         LV e' lateral:   9.36 cm/s LVOT diam:     2.30 cm         LV E/e' lateral: 9.4 LV  SV:         106 LV SV Index:   68 LVOT Area:     4.15 cm        3D Volume EF                                LV 3D EF:    Left                                             ventricul                                             ar                                             ejection                                             fraction                                             by 3D                                             volume is                                             72 %.                                 3D Volume EF:                                3D EF:        72 %                                LV EDV:       87 ml                                LV ESV:       24 ml                                LV SV:        62 ml RIGHT VENTRICLE  IVC RV S prime:     11.50 cm/s  IVC diam: 1.50 cm TAPSE (M-mode): 1.5 cm LEFT ATRIUM           Index        RIGHT ATRIUM          Index LA diam:      2.60 cm 1.67 cm/m   RA Area:     7.08 cm LA Vol (A2C): 41.7 ml 26.74 ml/m  RA Volume:   11.30 ml 7.25 ml/m LA Vol (A4C): 13.4 ml 8.59 ml/m  AORTIC VALVE AV Area (Vmax):    3.41 cm AV Area (Vmean):   3.39 cm AV Area (VTI):     3.65 cm AV Vmax:           179.00 cm/s  AV Vmean:          117.000 cm/s AV VTI:            0.289 m AV Peak Grad:      12.8 mmHg AV Mean Grad:      7.0 mmHg LVOT Vmax:         147.00 cm/s LVOT Vmean:        95.600 cm/s LVOT VTI:          0.254 m LVOT/AV VTI ratio: 0.88  AORTA Ao Root diam: 2.90 cm Ao Asc diam:  3.00 cm MITRAL VALVE                TRICUSPID VALVE MV Area (PHT): 2.48 cm     TR Peak grad:   34.1 mmHg MV Area VTI:   3.05 cm     TR Vmax:        292.00 cm/s MV Peak grad:  7.6 mmHg MV Mean grad:  3.0 mmHg     SHUNTS MV Vmax:       1.38 m/s     Systemic VTI:  0.25 m MV Vmean:      82.1 cm/s    Systemic Diam: 2.30 cm MV Decel Time: 307 msec MV E velocity: 88.10 cm/s MV A velocity: 125.50 cm/s MV E/A ratio:  0.70 Fransico Him MD Electronically signed by Fransico Him MD Signature Date/Time: 04/26/2022/12:12:26 PM    Final    DG Chest Port 1 View  Result Date: 04/26/2022 CLINICAL DATA:  CVA. EXAM: PORTABLE CHEST 1 VIEW COMPARISON:  Portable yesterday 2:50 p.m. FINDINGS: The heart size and mediastinal contours are within normal limits. There is aortic atherosclerosis. Both lungs are clear. The visualized skeletal structures are intact. The is advanced degenerative change of the shoulders with chronic rotator cuff arthropathy. Right axillary surgical clips. IMPRESSION: No active disease. Aortic atherosclerosis. Electronically Signed   By: Telford Nab M.D.   On: 04/26/2022 06:36   CT HEAD WO CONTRAST (5MM)  Result Date: 04/26/2022 CLINICAL DATA:  89 year old female presenting with headache, vomiting, hypertension. 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 EXAM: CT HEAD WITHOUT CONTRAST TECHNIQUE: Contiguous axial images were obtained from the base of the skull through the vertex without intravenous contrast. RADIATION DOSE REDUCTION: This exam was performed according to the departmental dose-optimization program which includes automated exposure control, adjustment of the mA and/or kV according to patient size and/or  use of iterative reconstruction technique. COMPARISON:  Brain MRI, head CT, CTA head and neck yesterday. FINDINGS: Brain: Cytotoxic edema in the left cerebellum PICA territory is now evident by CT corresponding to the MRI appearance. No hemorrhagic transformation. Basilar cisterns  remain patent, no posterior fossa mass effect at this time. Supratentorial gray-white differentiation remains stable, no convincing cytotoxic edema elsewhere. Chronic small and medium-sized vessel ischemic changes in both hemispheres. No midline shift, ventriculomegaly, mass effect, evidence of mass lesion, intracranial hemorrhage. Vascular: Calcified atherosclerosis at the skull base. Skull: No acute osseous abnormality identified. Sinuses/Orbits: Stable mild paranasal sinus opacity, stables mild paranasal sinus bubbly opacity and mucosal thickening. Tympanic cavities and mastoids remain clear. Other: No acute orbit or scalp soft tissue finding. IMPRESSION: 1. Expected CT appearance of Left cerebellum PICA infarct. No hemorrhagic transformation or posterior fossa mass effect at this time. 2. No other acute intracranial abnormality by CT. 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 Electronically Signed   By: Genevie Ann M.D.   On: 04/26/2022 05:37    Assessment/Plan 1. Nausea and vomiting, unspecified vomiting type -ongoing nausea at this time. Continues PRN zofran. Bland diet, advance as tolerates  2. Essential hypertension -not controlled with headache- will start propranolol twice daily for bp control.  - propranolol (INDERAL) 40 MG tablet; Take 1 tablet (40 mg total) by mouth 2 (two) times daily.  3. History of CVA (cerebrovascular accident) -continues on statin, asa and plavix.  Discussed importance of proper bp control.  Has neurology follow up scheduled.   4. Headache.  Ongoing from hospitalization, ?due to elevated bp, recent CVa -will add propranolol to help control bp and hopefully will improve  symptoms of headache.   Carlos American. [place], [facility] [phone]

## 2022-05-26 ENCOUNTER — Encounter: Payer: Self-pay | Admitting: Student

## 2022-05-26 ENCOUNTER — Non-Acute Institutional Stay (SKILLED_NURSING_FACILITY): Payer: Medicare Other | Admitting: Student

## 2022-05-26 DIAGNOSIS — R42 Dizziness and giddiness: Secondary | ICD-10-CM

## 2022-05-26 DIAGNOSIS — Z66 Do not resuscitate: Secondary | ICD-10-CM

## 2022-05-26 DIAGNOSIS — R112 Nausea with vomiting, unspecified: Secondary | ICD-10-CM | POA: Diagnosis not present

## 2022-05-26 DIAGNOSIS — I69398 Other sequelae of cerebral infarction: Secondary | ICD-10-CM

## 2022-05-26 DIAGNOSIS — I10 Essential (primary) hypertension: Secondary | ICD-10-CM

## 2022-05-26 DIAGNOSIS — R519 Headache, unspecified: Secondary | ICD-10-CM

## 2022-05-26 DIAGNOSIS — Z8673 Personal history of transient ischemic attack (TIA), and cerebral infarction without residual deficits: Secondary | ICD-10-CM | POA: Diagnosis not present

## 2022-05-26 MED ORDER — PROCHLORPERAZINE MALEATE 10 MG PO TABS: 10.0000 mg | ORAL_TABLET | Freq: Four times a day (QID) | ORAL | 0 refills | Status: DC | PRN

## 2022-05-26 MED ORDER — BACLOFEN 5 MG PO TABS: 5.0000 mg | ORAL_TABLET | Freq: Two times a day (BID) | ORAL | Status: DC | PRN

## 2022-05-26 MED ORDER — LABETALOL HCL 100 MG PO TABS: 100.0000 mg | ORAL_TABLET | Freq: Two times a day (BID) | ORAL | Status: DC

## 2022-05-26 NOTE — Progress Notes (Signed)
Location:  Other [place].  Nursing Home Room Number: [facility], [facility], [facility] [address]A Place of Service:  SNF [phone]) Provider:  Dr. Dewayne [name]  Sherry [name], Sherry Grief, MD  Patient Care Team: Sherry Aus, MD as PCP - General (Internal Medicine) Sherry Situ Kathlene November, MD as Consulting Physician (Cardiology)  Extended Emergency Contact Information Primary Emergency Contact: Sherry Sr. Home Phone: [phone] Mobile Phone: [phone] Relation: Spouse Secondary Emergency Contact: Sherry [name] Phone: [phone] Mobile Phone: [phone] Relation: Son  Code Status:  DNR (Most Form scanned) (Full Code listed in [facility]) Goals of care: Advanced Directive information    05/26/2022   12:15 PM  Advanced Directives  Does Patient Have a Medical Advance Directive? Yes  Type of Advance Directive Out of facility DNR (pink MOST or yellow form)  Does patient want to make changes to medical advance directive? No - Patient declined     Chief Complaint  Patient presents with   Acute Visit    Nausea and Vomiting.     HPI:  Pt is a 89 y.o. female seen today for an acute visit for H/A N/V.   She has been nauaseated all day. Vomited once. Headaches have been persistent. She has had confusion about why she is there.   Nursing states that they have had to repeat multiple times why she is here. Son also states everyone has had to repeat her reason for being at the facility on multiple occasions.   Spoke with her son who has the following concerns:  She isn't eating and she is vomiting and whether or not dehydration is a factor.  Stroke - do we think there is another one? This Probation officer explained that at this time her symptoms are no different than when she returned and evaluated on Monday. It seems her vomiting is a persistent from her most recent stroke since it is located in the vomiting centers of the brain. Memory changes - her short term memory has gotten worse. Her short term memory has  been extremely impacted  by the stroke.  He has discussed with social worker this morning that the goal is for her to stay as a long term patient.  She has been in the bed for ~ 1 month at this time. She has not been able to do any physical therapy at all because of ongoing nausea an dheadaches, etc. Son states. Before going to the hospital she was walker-dependent, but she would forget to use her walker at times. She was living a fairly "normal" life. They would go out to eat 3x per week. She had the stroke there and she was released to [facility] and at that point took her to [place]. They would not be interested in major surgeries. Not interested in any heroic measures. They are interested in if there is a reasonable chance for recovery then do what needs.   Spoke withoutpatient neurologist - Sherry [name] who will see her in the outpatient setting. Discussed she has persistent post-stroke nausea/vomiting. It has been cyclic - some last evening with signfiicant nausea, then this morning no sypmtoms while talking with son, then in the afternoon vomited x1 with more symptoms. Sherry [name] stated  "Stop the reglan in the post stroke period would not be recommended based on location of her strokes.. Sometimes post stroke headache - something like ubrelvy 100 mg for nausea. Nurtec - could be an option for intractable headaches. Toradol - for an acute scenario, but BP might be a problem and patient  had GIB inpatient. Low dose baclofen has helped sometimes  - she has hiccups - headaches. 5 mg BID prn. No more than 10 BID."  Past Medical History:  Diagnosis Date   Actinic keratosis    Breast cancer ([place])    C1 cervical fracture ([place])    Hypertension    Osteoarthritis    Past Surgical History:  Procedure Laterality Date   BACK SURGERY     BIOPSY  05/03/2022   Procedure: BIOPSY;  Surgeon: Sherry Flock, MD;  Location: [facility];  Service: Gastroenterology;;   ESOPHAGOGASTRODUODENOSCOPY (EGD) WITH  PROPOFOL N/A 05/03/2022   Procedure: ESOPHAGOGASTRODUODENOSCOPY (EGD) WITH PROPOFOL;  Surgeon: Sherry Flock, MD;  Location: [place] [facility] [facility];  Service: Gastroenterology;  Laterality: N/A;   HEMOSTASIS CONTROL  05/03/2022   Procedure: HEMOSTASIS CONTROL;  Surgeon: Sherry Flock, MD;  Location: [place];  Service: Gastroenterology;;   IR INJECT/THERA/INC NEEDLE/CATH/PLC EPI/LUMB/SAC [facility]  04/29/2021   MOHS SURGERY      Allergies  Allergen Reactions   Morphine     Other reaction(s): Other (See Comments) Apnea with large dose Respiratory Distress    Buprenorphine Hcl     Other reaction(s): Other (See Comments), Other (See Comments) Respiratory Distress Respiratory Distress    Codeine    Epinephrine     Other reaction(s): Other (See Comments) Severe anxiety; "makes my heart race" - would like to avoid during surgery   Morphine And Related Other (See Comments)    Respiratory Distress    Percocet [Oxycodone-Acetaminophen]     Outpatient Encounter Medications as of 05/26/2022  Medication Sig   acetaminophen (TYLENOL) 325 MG tablet Take 2 tablets (650 mg total) by mouth every 6 (six) hours as needed for headache.   aspirin EC 81 MG tablet Take 1 tablet (81 mg total) by mouth daily. Swallow whole.   atorvastatin (LIPITOR) 40 MG tablet Take 1 tablet (40 mg total) by mouth daily.   cholecalciferol (VITAMIN D) 25 MCG (1000 UNIT) tablet Take 2,000 Units by mouth daily.   clopidogrel (PLAVIX) 75 MG tablet Take 1 tablet (75 mg total) by mouth daily.   Cyanocobalamin (B-12) 1000 MCG CAPS Take 2,000 mcg by mouth daily.   hydrALAZINE (APRESOLINE) 50 MG tablet Take 50 mg by mouth every 8 (eight) hours as needed.   lactose free nutrition (BOOST) LIQD Take 237 mLs by mouth 3 (three) times daily between meals.   metoCLOPramide (REGLAN) 5 MG tablet Take 5 mg by mouth every 6 (six) hours as needed for nausea.   ondansetron (ZOFRAN) 4 MG tablet Take one tablet by mouth every 6 hours as  needed. Give one tablet by mouth every 8 hours for 2 days. Give one tablet by mouth twice daily for 2 days.   pantoprazole (PROTONIX) 40 MG tablet Take 1 tablet (40 mg total) by mouth 2 (two) times daily for 21 days, THEN 1 tablet (40 mg total) daily.   prochlorperazine (COMPAZINE) 5 MG tablet Take 5 mg by mouth every 6 (six) hours as needed for nausea or vomiting.   propranolol (INDERAL) 40 MG tablet Take 1 tablet (40 mg total) by mouth 2 (two) times daily.   senna-docusate (SENOKOT-S) 8.6-50 MG tablet Take 1 tablet by mouth 2 (two) times daily.   tamsulosin (FLOMAX) 0.4 MG CAPS capsule Take 1 capsule (0.4 mg total) by mouth daily.   No facility-administered encounter medications on file as of 05/26/2022.    Review of Systems  Immunization History  Administered Date(s) Administered   Influenza  Inj Mdck Quad Pf 11/30/2021   Influenza, High Dose Seasonal PF 12/16/2020   Influenza-Unspecified 12/06/2011, 11/27/2012, 01/19/2014, 12/11/2014, 12/03/2015, 12/10/2016, 12/18/2019   PFIZER Comirnaty(Gray Top)Covid-19 Tri-Sucrose Vaccine 03/13/2019, 04/03/2019, 12/05/2019   Pneumococcal Conjugate-13 12/31/2016, 01/05/2017   Td (Adult),5 Lf Tetanus Toxid, Preservative Free 07/16/2019   Tdap 06/18/2011   Zoster Recombinat (Shingrix) 06/30/2016, 10/26/2017, 01/06/2018   Pertinent  Health Maintenance Due  Topic Date Due   DEXA SCAN  Never done   INFLUENZA VACCINE  Completed      01/08/2022    9:00 AM 01/08/2022    7:58 PM 01/09/2022    9:00 AM 01/09/2022    8:25 PM 01/10/2022    8:50 AM  Fall Risk  (RETIRED) Patient Fall Risk Level High fall risk Moderate fall risk High fall risk High fall risk High fall risk   Functional Status Survey:    Vitals:   05/26/22 1207 05/26/22 1217  BP: (!) 146/73 (!) 154/83  Pulse: 74   Resp: 20   Temp: (!) 97.1 F (36.2 C)   SpO2: 97%   Weight: 119 lb 9.6 oz (54.3 kg)   Height: 5\' 2"  (1.575 m)    Body mass index is 21.88 kg/m. Physical Exam HENT:      Head: Normocephalic and atraumatic.     Nose: Nose normal.     Mouth/Throat:     Mouth: Mucous membranes are moist.  Eyes:     Conjunctiva/sclera: Conjunctivae normal.     Pupils: Pupils are equal, round, and reactive to light.  Cardiovascular:     Pulses: Normal pulses.  Pulmonary:     Effort: Pulmonary effort is normal.  Skin:    General: Skin is warm and dry.  Neurological:     Mental Status: She is alert.     Comments: 4/5 grip strength of bilateral hands. 4/5 bilateral lower extremity strength. Finger to nose intact right, severe deficit on the left. CN 3-12 grossly intact.   Patient is oriented to self and location, but unclear on reason for being here.      Labs reviewed: Recent Labs    04/26/22 0320 05/01/22 1251 05/06/22 0842 05/07/22 1052 05/08/22 0425 05/10/22 0657 05/16/22 0418 05/17/22 0434 05/19/22 1028  NA 133*   < > 134* 134* 135   < > 135 135 138  K 3.5   < > 3.8 3.5 3.8   < > 3.8 3.3* 3.7  CL 97*   < > 101 100 103   < > 102 103 103  CO2 22   < > 24 23 23    < > 24 22 23   GLUCOSE 131*   < > 105* 142* 100*   < > 86 102* 152*  BUN 19   < > 7* 13 10   < > 15 18 25*  CREATININE 0.64   < > 0.60 0.72 0.54   < > 0.57 0.58 0.49  CALCIUM 8.9   < > 8.4* 8.5* 8.5*   < > 9.2 8.9 9.2  MG 1.4*  --  1.3* 1.9 1.8  --   --   --   --   PHOS 3.0  --   --   --   --   --   --   --   --    < > = values in this interval not displayed.   Recent Labs    01/08/22 0327 04/25/22 0948 05/15/22 1404  AST 15 26 23   ALT 14 23 15  ALKPHOS 52 43 62  BILITOT 0.4 0.9 0.5  PROT 5.2* 7.0 6.5  ALBUMIN 2.7* 4.1 3.0*   Recent Labs    05/01/22 1251 05/02/22 0858 05/04/22 0803 05/05/22 0732 05/15/22 1404 05/16/22 0418 05/17/22 0434 05/19/22 1028  WBC 20.4*  21.0*   < > 13.4*   < > 7.8 7.0 7.2 8.2  NEUTROABS 13.6*  --  8.9*  --  5.0  --   --   --   HGB 7.8*  8.1*   < > 9.9*   < > 11.1* 11.2* 11.0* 11.5*  HCT 24.2*  23.3*   < > 28.5*   < > 34.5* 34.1* 32.9* 35.2*   MCV 93.4  90.0   < > 90.2   < > 92.7 91.2 90.1 91.2  PLT 344  331   < > 306   < > 537* 460* 451* 415*   < > = values in this interval not displayed.   No results found for: "TSH" Lab Results  Component Value Date   HGBA1C 5.6 04/26/2022   Lab Results  Component Value Date   CHOL 233 (H) 04/26/2022   HDL 53 04/26/2022   LDLCALC 122 (H) 04/26/2022   LDLDIRECT 114 (H) 04/26/2022   TRIG 292 (H) 04/26/2022   CHOLHDL 4.4 04/26/2022    Significant Diagnostic Results in last 30 days:  MR BRAIN WO CONTRAST  Result Date: 05/18/2022 CLINICAL DATA:  Provided history: Recent stroke. Ongoing vertigo. History of breast cancer. 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 EXAM: MRI HEAD WITHOUT CONTRAST TECHNIQUE: Multiplanar, multiecho pulse sequences of the brain and surrounding structures were obtained without intravenous contrast. COMPARISON:  Prior head CT examinations 05/15/2022 and earlier. Brain MRI 04/25/2022. FINDINGS: Brain: Mild generalized parenchymal atrophy. New from the prior brain MRI of 04/25/2022, there is a 4 mm acute infarct within the left dorsolateral aspect of the medulla (series 5, image 6). Known moderately large late subacute left PICA territory infarct within the left cerebellar hemisphere. Redemonstrated small focus of chronic encephalomalacia/gliosis within the anterior right frontal lobe, which may be posttraumatic in etiology or may reflect a chronic infarct. Redemonstrated small chronic cortical infarct within the right occipital lobe. Background moderate multifocal T2 FLAIR hyperintense signal abnormality within the cerebral white matter, nonspecific but compatible with chronic small vessel ischemic disease. Redemonstrated chronic lacunar infarcts within the left basal ganglia/internal capsule. Punctate chronic microhemorrhage within the left frontoparietal white matter. No evidence of an intracranial mass. No extra-axial fluid collection. No  midline shift. Vascular: Signal abnormality within the intracranial left vertebral artery compatible with known vessel occlusion. Flow voids preserved elsewhere within the proximal large arterial vessels. 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 Incompletely assessed cervical spondylosis. Sinuses/Orbits: No mass or acute finding within the imaged orbits. Prior bilateral ocular lens replacement. Mucous retention cyst within a posterior right ethmoid air cell. Small mucous retention cyst within the left sphenoid sinus. Other: Right mastoid effusion. Trace fluid also present within the left mastoid air cells. Impression #1 will be called to the ordering clinician or representative by the Radiologist Assistant, and communication documented in the PACS or [employer]. IMPRESSION: 1. 4 mm acute infarct within the left dorsolateral aspect of the medulla, new from the prior brain MRI of 04/25/2022. 2. Known moderately large late subacute left PICA territory infarct within the left cerebellar hemisphere. 3. Redemonstrated small focus of chronic encephalomalacia/gliosis within the anterior right frontal lobe, which may  be posttraumatic in etiology or may reflect a chronic infarct. 4. Redemonstrated small chronic cortically-based infarct within the right occipital lobe. 5. Background parenchymal atrophy and chronic small vessel ischemic disease with chronic small-vessel infarcts, as described. 6. Signal abnormality within the intracranial left vertebral artery compatible with known vessel occlusion. 7. Known chronic nonunited type 2 dens fracture. 8. Paranasal sinus disease, as outlined. 9. Right mastoid effusion. Electronically Signed   By: Kellie Simmering D.O.   On: 05/18/2022 12:46   CT Abdomen Pelvis W Contrast  Result Date: 05/15/2022 CLINICAL DATA:  Abdomen pain EXAM: CT ABDOMEN AND PELVIS WITH CONTRAST TECHNIQUE: Multidetector CT imaging of the abdomen and pelvis  was performed using the standard protocol following bolus administration of intravenous contrast. RADIATION DOSE REDUCTION: This exam was performed according to the departmental dose-optimization program which includes automated exposure control, adjustment of the mA and/or kV according to patient size and/or use of iterative reconstruction technique. CONTRAST:  158mL OMNIPAQUE IOHEXOL 300 MG/ML  SOLN COMPARISON:  CT 05/02/2022, 05/17/2021 FINDINGS: Lower chest: Lung bases demonstrate small left greater than right pleural effusions which are new. Partial consolidations in the lower lobes favored to represent atelectasis. Mild scarring right middle lobe Hepatobiliary: Distended gallbladder with stones. No inflammatory change or biliary dilatation. Small cyst within the central liver. Pancreas: No inflammation. Stable 8 mm cyst at the uncinate process. Spleen: Normal in size without focal abnormality. Adrenals/Urinary Tract: Adrenal glands are normal. Kidneys show no hydronephrosis. The bladder contains Foley catheter and appears slightly thick-walled with mild mucosal enhancement. Stomach/Bowel: Stomach is nonenlarged. Thickening of the pylorus with mucosal enhancement. No dilated small bowel. Large feces in the colon and rectum. Vascular/Lymphatic: Moderate aortic atherosclerosis. No aneurysm. No suspicious lymph nodes. Reproductive: Status post hysterectomy. No adnexal masses. Other: Negative for pelvic effusion.  No free air. Musculoskeletal: No acute or suspicious osseous abnormality. Multilevel degenerative change with grade 1 anterolisthesis L5 on S1. Stable 1.8 cm nodule in the right inferior breast. IMPRESSION: 1. Small left greater than right pleural effusions with partial consolidations in the lower lobes favored to represent atelectasis. 2. Distended gallbladder with stones but no inflammatory change or biliary dilatation. 3. Thickening of the pylorus with mucosal enhancement, suspicious for peptic ulcer  disease. No evidence for perforation at this time. 4. Slightly thick-walled appearance of the urinary bladder with mild mucosal enhancement, correlate for cystitis. 5. Large volume of feces in the colon and rectum suggesting constipation. 6. Stable 1.8 cm right inferior breast nodule 7. Aortic atherosclerosis. Aortic Atherosclerosis (ICD10-I70.0). Electronically Signed   By: Donavan Foil M.D.   On: 05/15/2022 17:15   DG Chest Port 1 View  Result Date: 05/15/2022 CLINICAL DATA:  Weakness EXAM: PORTABLE CHEST 1 VIEW COMPARISON:  Radiograph 05/01/2022 FINDINGS: Unchanged cardiomediastinal silhouette. There is no new focal airspace consolidation. There is no pleural effusion or evidence of pneumothorax. Bilateral shoulder degenerative changes with unchanged osteochondral joint bodies. Thoracic spondylosis. IMPRESSION: No evidence of acute cardiopulmonary disease. Electronically Signed   By: Maurine Simmering M.D.   On: 05/15/2022 15:20   CT Head Wo Contrast  Result Date: 05/15/2022 CLINICAL DATA:  Concern for acute stroke EXAM: CT HEAD WITHOUT CONTRAST TECHNIQUE: Contiguous axial images were obtained from the base of the skull through the vertex without intravenous contrast. RADIATION DOSE REDUCTION: This exam was performed according to the departmental dose-optimization program which includes automated exposure control, adjustment of the mA and/or kV according to patient size and/or use of iterative reconstruction technique. COMPARISON:  None  Available. FINDINGS: Brain: No acute intracranial hemorrhage. No focal mass lesion. No CT evidence of acute infarction. No midline shift or mass effect. No hydrocephalus. Basilar cisterns are patent. There are periventricular and subcortical white matter hypodensities. Generalized cortical atrophy. Vascular: No hyperdense vessel or unexpected calcification. Skull: Normal. Negative for fracture or focal lesion. Sinuses/Orbits: Paranasal sinuses and mastoid air cells are clear.  Orbits are clear. Other: None. IMPRESSION: 1. No acute intracranial findings. 2. Atrophy and white matter microvascular disease. Electronically Signed   By: Suzy Bouchard M.D.   On: 05/15/2022 15:16   DG Abd Portable 1V  Result Date: 05/05/2022 CLINICAL DATA:  Abdominal discomfort. EXAM: PORTABLE ABDOMEN - 1 VIEW COMPARISON:  05/22/2021 and CT 05/02/2022 FINDINGS: Calcifications in the right abdomen may be associated with the known gallstones. Lung bases are clear. Nonobstructive bowel gas pattern. Surgical clip in the right hemipelvis. Limited evaluation for free air on the supine images. IMPRESSION: 1. Nonobstructive bowel gas pattern. 2. Cholelithiasis. Electronically Signed   By: Markus Daft M.D.   On: 05/05/2022 17:08   CT ABDOMEN PELVIS W CONTRAST  Result Date: 05/02/2022 CLINICAL DATA:  Abdominal pain, acute. Nonlocalized. History of duodenal perforation. EXAM: CT ABDOMEN AND PELVIS WITH CONTRAST TECHNIQUE: Multidetector CT imaging of the abdomen and pelvis was performed using the standard protocol following bolus administration of intravenous contrast. RADIATION DOSE REDUCTION: This exam was performed according to the departmental dose-optimization program which includes automated exposure control, adjustment of the mA and/or kV according to patient size and/or use of iterative reconstruction technique. CONTRAST:  38mL OMNIPAQUE IOHEXOL 350 MG/ML SOLN COMPARISON:  05/17/2021 FINDINGS: Lower chest: No acute abnormality. Hepatobiliary: No suspicious liver abnormality. Small cyst within central left liver measures 0.8 cm and is unchanged from previous exam. Lobe of gallbladder appears distended. There are 2 no gallbladder wall thickening. No bile duct dilatation. Stones within the gallbladder measuring up to 2 cm. Pancreas: Cyst in uncinate process measures 8 mm and is unchanged from the previous exam. No main duct dilatation, inflammation or distension. Spleen: Normal in size without focal abnormality.  Adrenals/Urinary Tract: Normal adrenal glands. No kidney mass, hydronephrosis, or nephrolithiasis. Urinary bladder is decompressed around a Foley catheter. Stomach/Bowel: There is wall thickening involving the pylorus and descending portion of the duodenum with surrounding soft tissue stranding. Transmural gas collection is identified within the anterior wall of the pylorus measuring 1.2 cm, image 34/3. On the previous exam this measures 1.1 cm. This may represent an area of transmural ulceration versus small duodenal diverticulum. No sign of perforation. The remaining small bowel loops are unremarkable. No pathologic dilatation of the large or small bowel loops. Sigmoid diverticulosis without acute diverticulitis. Moderate retained stool noted within the colon. Vascular/Lymphatic: Aortic atherosclerosis. No enlarged abdominal or pelvic lymph nodes. Reproductive: Status post hysterectomy. No adnexal masses. Other: No free fluid or focal fluid collections. Musculoskeletal: Lumbar spondylosis and facet arthropathy. No acute or suspicious osseous findings. Postoperative changes from prior lumpectomy noted within the right breast. Soft tissue density nodule within the right lower breast measures 1.7 cm, image 18/3 previously this measured 2.0 cm. IMPRESSION: 1. There is wall thickening involving the pylorus and descending portion of the duodenum with surrounding soft tissue stranding. Transmural gas collection is identified within the anterior wall of the pylorus measuring 1.2 cm. On the previous exam this measures 1.1 cm. This may represent an area of transmural ulceration versus small duodenal diverticulum. No sign of perforation. 2. Gallstones with distended gallbladder. No gallbladder wall thickening.  3. Sigmoid diverticulosis without acute diverticulitis. 4. Moderate retained stool noted within the colon. Correlate for any clinical signs or symptoms of constipation. 5. Stable 8 mm cystic lesion in the uncinate  process of the pancreas. Given the patient's age, no further workup or follow-up is recommended. 6. Soft tissue density nodule within the right lower breast measures 1.7 cm previously this measured 2.0 cm. 7.  Aortic Atherosclerosis (ICD10-I70.0). Electronically Signed   By: Kerby Moors M.D.   On: 05/02/2022 10:39   DG CHEST PORT 1 VIEW  Result Date: 05/01/2022 CLINICAL DATA:  Leukocytosis. EXAM: PORTABLE CHEST 1 VIEW COMPARISON:  Chest radiograph dated 04/25/2022. FINDINGS: No focal consolidation, pleural effusion, pneumothorax. Linear density along the lateral left lung most consistent with a skin fold. Repeat radiograph with repositioning of the patient may provide better evaluation. The cardiac silhouette is within normal limits. No acute osseous pathology. IMPRESSION: 1. No acute cardiopulmonary process. 2. Probable skin fold along the lateral left lung. Electronically Signed   By: Anner Crete M.D.   On: 05/01/2022 17:52   CT HEAD WO CONTRAST (5MM)  Result Date: 04/30/2022 CLINICAL DATA:  Stroke follow-up EXAM: CT HEAD WITHOUT CONTRAST TECHNIQUE: Contiguous axial images were obtained from the base of the skull through the vertex without intravenous contrast. RADIATION DOSE REDUCTION: This exam was performed according to the departmental dose-optimization program which includes automated exposure control, adjustment of the mA and/or kV according to patient size and/or use of iterative reconstruction technique. COMPARISON:  CT Head 04/26/22, MRI Head 04/25/22 FINDINGS: Brain: Evolving left cerebellar infarct. No evidence of hemorrhage. No CT evidence of a new infarct. Redemonstrated is a subacute right occipital lobe infarct and a chronic right frontal lobe infarct. There is also a chronic infarct in the left caudate head. Sequela of moderate chronic microvascular ischemic change. No hydrocephalus. No extra-axial fluid collection. Vascular: No hyperdense vessel or unexpected calcification. Skull:  Normal. Negative for fracture or focal lesion. Sinuses/Orbits: No mastoid or middle ear effusion. Frothy secretions in the left sphenoid sinus in the posterior right ethmoid air cells, which can be seen in the setting of acute sinusitis. Bilateral lens replacement. Orbits are otherwise unremarkable. Other: None. IMPRESSION: Evolving left cerebellar infarct. No evidence of hemorrhage. No CT evidence of a new infarct. No hydrocephalus. Electronically Signed   By: Marin Roberts M.D.   On: 04/30/2022 12:38    Assessment/Plan Nausea and vomiting, unspecified vomiting type  Vertigo following cerebrovascular accident  H/O ischemic vertebrobasilar artery cerebellar stroke Patient has had persistent nausea/vomiting/headache after recent stroke. Difficulty with symptom control. During hospitalization was managed with thorazine with adequate control. Since return patient has had waxing and waning symptoms. Significant anxiety with memory changes and disorientation as well. Plan to discontinue hydralazine as that can contribute to headaches and discontinue propranolol at this time. Start labetalol 100 mg BID for BP control. Discontinue reglan per recommendations of neurologist. Continue compazine at higher dose 10 mg q6hrs prn for nausea and continue scheduled zofran taper. Will consider phenergan vs ativan vs haldol to aid with nausea as that is the primary concern at this time. Based conversation with the HCPOA that was available to converse at this time - son Kayly, [place]. Patient maintains status of DNR. They are open to continued hospitalization if indicated, but only if there is reasonable recovery expected. Discussed labs for tomorrow include CBC and BMP and that an AKI would be a potential indication for admission if we are unable to adequately hydrate patient.  Discussed concern for poor prognosis for patient given numerous strokes in the last few weeks. She is on goal directed therapy with Statin, ASA, and  plavix. Continue to monitor for other signs or symptoms concerning for stroke.   Family/ staff Communication: nursing, HCPOA  Labs/tests ordered:  CBC, BMP  I spent >1 hour for the care of this patient in face to face time, chart review, communication with specialists, and I spent an additional 22 minutes discussing goals of care with the family.

## 2022-05-27 LAB — CBC AND DIFFERENTIAL
HCT: 34 — AB (ref 36–46)
Hemoglobin: 11.1 — AB (ref 12.0–16.0)
Neutrophils Absolute: 4755
Platelets: 426 10*3/uL — AB (ref 150–400)
WBC: 7.5

## 2022-05-27 LAB — CBC: RBC: 3.84 — AB (ref 3.87–5.11)

## 2022-05-27 LAB — COMPREHENSIVE METABOLIC PANEL
Calcium: 9.5 (ref 8.7–10.7)
eGFR: 87

## 2022-05-27 LAB — BASIC METABOLIC PANEL
BUN: 19 (ref 4–21)
CO2: 25 — AB (ref 13–22)
Chloride: 103 (ref 99–108)
Creatinine: 0.6 (ref 0.5–1.1)
Glucose: 97
Potassium: 4 mEq/L (ref 3.5–5.1)
Sodium: 137 (ref 137–147)

## 2022-05-28 ENCOUNTER — Telehealth (SKILLED_NURSING_FACILITY): Payer: Medicare Other | Admitting: Student

## 2022-05-28 DIAGNOSIS — R197 Diarrhea, unspecified: Secondary | ICD-10-CM | POA: Diagnosis not present

## 2022-05-28 DIAGNOSIS — R112 Nausea with vomiting, unspecified: Secondary | ICD-10-CM

## 2022-05-28 MED ORDER — HALOPERIDOL 1 MG PO TABS: 1.0000 mg | ORAL_TABLET | Freq: Four times a day (QID) | ORAL | 0 refills | Status: DC | PRN

## 2022-05-28 MED ORDER — LOPERAMIDE HCL 2 MG PO TABS: 2.0000 mg | ORAL_TABLET | Freq: Four times a day (QID) | ORAL | 0 refills | Status: DC | PRN

## 2022-05-28 MED ORDER — PROMETHAZINE HCL 12.5 MG PO TABS: 12.5000 mg | ORAL_TABLET | Freq: Three times a day (TID) | ORAL | 0 refills | Status: DC | PRN

## 2022-05-28 NOTE — Telephone Encounter (Addendum)
Location of patient: [place] Location of provider: office Time of call: 15 min  Received call from nursing staff that patient has continued having nausea and vomiting and had an episode of diarrhea. Plan to discontinue senna and any other laxitives. Immodium 2 mg q4prn for >5 loose stools in a day.   Nausea and Vomiting likely due to underlying stroke. Minimal improvement with scheduled zofran and prn compazine. Plan to discontinue compazine and trial phenergan 12.8 mg q8 hours. Also use haldol 1mg  q6 prn for nausea as third line agent. If this is insufficient, worth trial of ativan 1 mg q8 prn for last line. 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 Previous studies have shown scheduled zofran and reglan which has been insufficient. Patient's labs without evidence of AKI  Cr 0.58 and hemoglobin stable at ~11. Given history of GIB in previous hospitalization, plan to collect CBC on Monday. Patient has follow up with GI scheduled in May for PUD. Plan to follow up with patient on Monday.

## 2022-05-31 ENCOUNTER — Encounter: Payer: Self-pay | Admitting: Student

## 2022-05-31 ENCOUNTER — Non-Acute Institutional Stay (SKILLED_NURSING_FACILITY): Payer: Medicare Other | Admitting: Student

## 2022-05-31 DIAGNOSIS — R112 Nausea with vomiting, unspecified: Secondary | ICD-10-CM | POA: Diagnosis not present

## 2022-05-31 DIAGNOSIS — R519 Headache, unspecified: Secondary | ICD-10-CM | POA: Diagnosis not present

## 2022-05-31 DIAGNOSIS — I10 Essential (primary) hypertension: Secondary | ICD-10-CM | POA: Diagnosis not present

## 2022-05-31 LAB — BASIC METABOLIC PANEL
BUN: 16 (ref 4–21)
CO2: 24 — AB (ref 13–22)
Chloride: 107 (ref 99–108)
Creatinine: 0.5 (ref 0.5–1.1)
Glucose: 86
Potassium: 3.9 mEq/L (ref 3.5–5.1)
Sodium: 140 (ref 137–147)

## 2022-05-31 LAB — CBC AND DIFFERENTIAL
HCT: 32 — AB (ref 36–46)
Hemoglobin: 10.2 — AB (ref 12.0–16.0)
Neutrophils Absolute: 3374
Platelets: 386 10*3/uL (ref 150–400)
WBC: 6.9

## 2022-05-31 LAB — CBC: RBC: 3.57 — AB (ref 3.87–5.11)

## 2022-05-31 LAB — COMPREHENSIVE METABOLIC PANEL
Calcium: 8.9 (ref 8.7–10.7)
eGFR: 89

## 2022-05-31 NOTE — Progress Notes (Unsigned)
Location:  Other [facility].  Nursing Home Room Number: [facility] [address] Place of Service:  SNF [phone]) Provider:  Dr. Dewayne Shorter  PCP: Rusty Aus, MD  Patient Care Team: Rusty Aus, MD as PCP - General (Internal Medicine) [name] November, MD as Consulting Physician (Cardiology)  Extended Emergency Contact Information Primary Emergency Contact: Abdo,james Sr. Home Phone: [phone] Mobile Phone: [phone] Relation: Spouse Secondary Emergency Contact: [place] Phone: [phone] Mobile Phone: [phone] Relation: Son  Code Status:  DNR Goals of care: Advanced Directive information    05/31/2022   10:44 AM  Advanced Directives  Does Patient Have a Medical Advance Directive? Yes  Type of Advance Directive Out of facility DNR (pink MOST or yellow form)  Does patient want to make changes to medical advance directive? No - Patient declined    Chief Complaint  Patient presents with   Acute Visit    Nausea and Vomiting.    HPI:  Pt is a 89 y.o. female seen today for an acute visit for follow up of symptoms with changes to medication regimen. Patient denies nausea. She says, "I just got here yesterday." She has not been able to retain memories well.  Nursing states her symptoms worsen when she is more confused.   BP has been elevated.    Past Medical History:  Diagnosis Date   Actinic keratosis    Breast cancer    C1 cervical fracture    Hypertension    Osteoarthritis    Past Surgical History:  Procedure Laterality Date   BACK SURGERY     BIOPSY  05/03/2022   Procedure: BIOPSY;  Surgeon: Yetta Flock, MD;  Location: [facility];  Service: Gastroenterology;;   ESOPHAGOGASTRODUODENOSCOPY (EGD) WITH PROPOFOL N/A 05/03/2022   Procedure: ESOPHAGOGASTRODUODENOSCOPY (EGD) WITH PROPOFOL;  Surgeon: Yetta Flock, MD;  Location: [place];  Service: Gastroenterology;  Laterality: N/A;   HEMOSTASIS CONTROL  05/03/2022    Procedure: HEMOSTASIS CONTROL;  Surgeon: Yetta Flock, MD;  Location: [place];  Service: Gastroenterology;;   IR INJECT/THERA/INC NEEDLE/CATH/PLC EPI/LUMB/SAC [facility]  04/29/2021   MOHS SURGERY      Allergies  Allergen Reactions   Morphine     Other reaction(s): Other (See Comments) Apnea with large dose Respiratory Distress    Buprenorphine Hcl     Other reaction(s): Other (See Comments), Other (See Comments) Respiratory Distress Respiratory Distress    Codeine    Epinephrine     Other reaction(s): Other (See Comments) Severe anxiety; "makes my heart race" - would like to avoid during surgery   Morphine And Related Other (See Comments)    Respiratory Distress    Percocet [Oxycodone-Acetaminophen]     Outpatient Encounter Medications as of 05/31/2022  Medication Sig   acetaminophen (TYLENOL) 325 MG tablet Take 2 tablets (650 mg total) by mouth every 6 (six) hours as needed for headache.   aspirin EC 81 MG tablet Take 1 tablet (81 mg total) by mouth daily. Swallow whole.   atorvastatin (LIPITOR) 40 MG tablet Take 1 tablet (40 mg total) by mouth daily.   Baclofen 5 MG TABS Take 1 tablet (5 mg total) by mouth 2 (two) times daily as needed (HEADACHES AND HICCUPS).   cholecalciferol (VITAMIN D) 25 MCG (1000 UNIT) tablet Take 2,000 Units by mouth daily.   clopidogrel (PLAVIX) 75 MG tablet Take 1 tablet (75 mg total) by mouth daily.   Cyanocobalamin (B-12) 1000 MCG CAPS Take 2,000 mcg by mouth daily.  haloperidol (HALDOL) 1 MG tablet Take 1 tablet (1 mg total) by mouth every 6 (six) hours as needed (nausea vomiting.).   labetalol (NORMODYNE) 100 MG tablet Take 100 mg by mouth daily.   lactose free nutrition (BOOST) LIQD Take 237 mLs by mouth 3 (three) times daily between meals.   loperamide (IMODIUM A-D) 2 MG tablet Take 1 tablet (2 mg total) by mouth 4 (four) times daily as needed for diarrhea or loose stools.   ondansetron (ZOFRAN) 4 MG tablet Take one tablet by mouth every  6 hours as needed.   pantoprazole (PROTONIX) 40 MG tablet Take 1 tablet (40 mg total) by mouth 2 (two) times daily for 21 days, THEN 1 tablet (40 mg total) daily.   promethazine (PHENERGAN) 12.5 MG tablet Take 1 tablet (12.5 mg total) by mouth every 8 (eight) hours as needed for nausea or vomiting.   tamsulosin (FLOMAX) 0.4 MG CAPS capsule Take 1 capsule (0.4 mg total) by mouth daily.   [DISCONTINUED] labetalol (NORMODYNE) 100 MG tablet Take 1 tablet (100 mg total) by mouth 2 (two) times daily. (Patient taking differently: Take 100 mg by mouth daily.)   [DISCONTINUED] senna-docusate (SENOKOT-S) 8.6-50 MG tablet Take 1 tablet by mouth 2 (two) times daily.   No facility-administered encounter medications on file as of 05/31/2022.    Review of Systems  Immunization History  Administered Date(s) Administered   Influenza Inj Mdck Quad Pf 11/30/2021   Influenza, High Dose Seasonal PF 12/16/2020   Influenza-Unspecified 12/06/2011, 11/27/2012, 01/19/2014, 12/11/2014, 12/03/2015, 12/10/2016, 12/18/2019   PFIZER Comirnaty(Gray Top)Covid-19 Tri-Sucrose Vaccine 03/13/2019, 04/03/2019, 12/05/2019   Pneumococcal Conjugate-13 12/31/2016, 01/05/2017   Td (Adult),5 Lf Tetanus Toxid, Preservative Free 07/16/2019   Tdap 06/18/2011   Zoster Recombinat (Shingrix) 06/30/2016, 10/26/2017, 01/06/2018   Pertinent  Health Maintenance Due  Topic Date Due   DEXA SCAN  Never done   INFLUENZA VACCINE  09/30/2022      01/08/2022    9:00 AM 01/08/2022    7:58 PM 01/09/2022    9:00 AM 01/09/2022    8:25 PM 01/10/2022    8:50 AM  Fall Risk  (RETIRED) Patient Fall Risk Level High fall risk Moderate fall risk High fall risk High fall risk High fall risk   Functional Status Survey:    Vitals:   05/31/22 1037 05/31/22 1048  BP: (!) 179/72 (!) 160/74  Pulse: 64   Resp: 12   Temp: 97.7 F (36.5 C)   SpO2: 95%   Weight: 119 lb 9.6 oz (54.3 kg)   Height: 5\' 2"  (1.575 m)    Body mass index is 21.88  kg/m. Physical Exam  Labs reviewed: Recent Labs    04/26/22 0320 05/01/22 1251 05/06/22 [id number] 05/07/22 1052 05/08/22 0425 05/10/22 0657 05/16/22 0418 05/17/22 0434 05/19/22 1028 05/27/22 0000  NA 133*   < > 134* 134* 135   < > 135 135 138 137  K 3.5   < > 3.8 3.5 3.8   < > 3.8 3.3* 3.7 4.0  CL 97*   < > 101 100 103   < > 102 103 103 103  CO2 22   < > 24 23 23    < > 24 22 23  25*  GLUCOSE 131*   < > 105* 142* 100*   < > 86 102* 152*  --   BUN 19   < > 7* 13 10   < > 15 18 25* 19  CREATININE 0.64   < > 0.60 0.72 0.54   < >  0.57 0.58 0.49 0.6  CALCIUM 8.9   < > 8.4* 8.5* 8.5*   < > 9.2 8.9 9.2 9.5  MG 1.4*  --  1.3* 1.9 1.8  --   --   --   --   --   PHOS 3.0  --   --   --   --   --   --   --   --   --    < > = values in this interval not displayed.   Recent Labs    01/08/22 0327 04/25/22 0948 05/15/22 1404  AST 15 26 23   ALT 14 23 15   ALKPHOS 52 43 62  BILITOT 0.4 0.9 0.5  PROT 5.2* 7.0 6.5  ALBUMIN 2.7* 4.1 3.0*   Recent Labs    05/04/22 0803 05/05/22 0732 05/15/22 1404 05/16/22 0418 05/17/22 0434 05/19/22 1028 05/27/22 0000  WBC 13.4*   < > 7.8 7.0 7.2 8.2 7.5  NEUTROABS 8.9*  --  5.0  --   --   --  4,755.00  HGB 9.9*   < > 11.1* 11.2* 11.0* 11.5* 11.1*  HCT 28.5*   < > 34.5* 34.1* 32.9* 35.2* 34*  MCV 90.2   < > 92.7 91.2 90.1 91.2  --   PLT 306   < > 537* 460* 451* 415* 426*   < > = values in this interval not displayed.   No results found for: "TSH" Lab Results  Component Value Date   HGBA1C 5.6 04/26/2022   Lab Results  Component Value Date   CHOL 233 (H) 04/26/2022   HDL 53 04/26/2022   LDLCALC 122 (H) 04/26/2022   LDLDIRECT 114 (H) 04/26/2022   TRIG 292 (H) 04/26/2022   CHOLHDL 4.4 04/26/2022    Significant Diagnostic Results in last 30 days:  MR BRAIN WO CONTRAST  Result Date: 05/18/2022 CLINICAL DATA:  Provided history: Recent stroke. Ongoing vertigo. History of breast cancer. 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 EXAM: MRI HEAD WITHOUT CONTRAST TECHNIQUE: Multiplanar, multiecho pulse sequences of the brain and surrounding structures were obtained without intravenous contrast. COMPARISON:  Prior head CT examinations 05/15/2022 and earlier. Brain MRI 04/25/2022. FINDINGS: Brain: Mild generalized parenchymal atrophy. New from the prior brain MRI of 04/25/2022, there is a 4 mm acute infarct within the left dorsolateral aspect of the medulla (series 5, image 6). Known moderately large late subacute left PICA territory infarct within the left cerebellar hemisphere. Redemonstrated small focus of chronic encephalomalacia/gliosis within the anterior right frontal lobe, which may be posttraumatic in etiology or may reflect a chronic infarct. Redemonstrated small chronic cortical infarct within the right occipital lobe. Background moderate multifocal T2 FLAIR hyperintense signal abnormality within the cerebral white matter, nonspecific but compatible with chronic small vessel ischemic disease. Redemonstrated chronic lacunar infarcts within the left basal ganglia/internal capsule. Punctate chronic microhemorrhage within the left frontoparietal white matter. No evidence of an intracranial mass. No extra-axial fluid collection. No midline shift. Vascular: Signal abnormality within the intracranial left vertebral artery compatible with known vessel occlusion. Flow voids preserved elsewhere within the proximal large arterial vessels. 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 Incompletely assessed cervical spondylosis. Sinuses/Orbits: No mass or acute finding within the imaged orbits. Prior bilateral ocular lens replacement. Mucous retention cyst within a posterior right ethmoid air cell. Small mucous retention cyst within the left sphenoid sinus. Other: Right mastoid effusion. Trace fluid also present within the left mastoid air  cells. Impression #1 will be called to the ordering  clinician or representative by the Radiologist Assistant, and communication documented in the PACS or [employer]. IMPRESSION: 1. 4 mm acute infarct within the left dorsolateral aspect of the medulla, new from the prior brain MRI of 04/25/2022. 2. Known moderately large late subacute left PICA territory infarct within the left cerebellar hemisphere. 3. Redemonstrated small focus of chronic encephalomalacia/gliosis within the anterior right frontal lobe, which may be posttraumatic in etiology or may reflect a chronic infarct. 4. Redemonstrated small chronic cortically-based infarct within the right occipital lobe. 5. Background parenchymal atrophy and chronic small vessel ischemic disease with chronic small-vessel infarcts, as described. 6. Signal abnormality within the intracranial left vertebral artery compatible with known vessel occlusion. 7. Known chronic nonunited type 2 dens fracture. 8. Paranasal sinus disease, as outlined. 9. Right mastoid effusion. Electronically Signed   By: Kellie Simmering D.O.   On: 05/18/2022 12:46   CT Abdomen Pelvis W Contrast  Result Date: 05/15/2022 CLINICAL DATA:  Abdomen pain EXAM: CT ABDOMEN AND PELVIS WITH CONTRAST TECHNIQUE: Multidetector CT imaging of the abdomen and pelvis was performed using the standard protocol following bolus administration of intravenous contrast. RADIATION DOSE REDUCTION: This exam was performed according to the departmental dose-optimization program which includes automated exposure control, adjustment of the mA and/or kV according to patient size and/or use of iterative reconstruction technique. CONTRAST:  164mL OMNIPAQUE IOHEXOL 300 MG/ML  SOLN COMPARISON:  CT 05/02/2022, 05/17/2021 FINDINGS: Lower chest: Lung bases demonstrate small left greater than right pleural effusions which are new. Partial consolidations in the lower lobes favored to represent atelectasis. Mild scarring right middle lobe Hepatobiliary: Distended gallbladder with stones.  No inflammatory change or biliary dilatation. Small cyst within the central liver. Pancreas: No inflammation. Stable 8 mm cyst at the uncinate process. Spleen: Normal in size without focal abnormality. Adrenals/Urinary Tract: Adrenal glands are normal. Kidneys show no hydronephrosis. The bladder contains Foley catheter and appears slightly thick-walled with mild mucosal enhancement. Stomach/Bowel: Stomach is nonenlarged. Thickening of the pylorus with mucosal enhancement. No dilated small bowel. Large feces in the colon and rectum. Vascular/Lymphatic: Moderate aortic atherosclerosis. No aneurysm. No suspicious lymph nodes. Reproductive: Status post hysterectomy. No adnexal masses. Other: Negative for pelvic effusion.  No free air. Musculoskeletal: No acute or suspicious osseous abnormality. Multilevel degenerative change with grade 1 anterolisthesis L5 on S1. Stable 1.8 cm nodule in the right inferior breast. IMPRESSION: 1. Small left greater than right pleural effusions with partial consolidations in the lower lobes favored to represent atelectasis. 2. Distended gallbladder with stones but no inflammatory change or biliary dilatation. 3. Thickening of the pylorus with mucosal enhancement, suspicious for peptic ulcer disease. No evidence for perforation at this time. 4. Slightly thick-walled appearance of the urinary bladder with mild mucosal enhancement, correlate for cystitis. 5. Large volume of feces in the colon and rectum suggesting constipation. 6. Stable 1.8 cm right inferior breast nodule 7. Aortic atherosclerosis. Aortic Atherosclerosis (ICD10-I70.0). Electronically Signed   By: Donavan Foil M.D.   On: 05/15/2022 17:15   DG Chest Port 1 View  Result Date: 05/15/2022 CLINICAL DATA:  Weakness EXAM: PORTABLE CHEST 1 VIEW COMPARISON:  Radiograph 05/01/2022 FINDINGS: Unchanged cardiomediastinal silhouette. There is no new focal airspace consolidation. There is no pleural effusion or evidence of  pneumothorax. Bilateral shoulder degenerative changes with unchanged osteochondral joint bodies. Thoracic spondylosis. IMPRESSION: No evidence of acute cardiopulmonary disease. Electronically Signed   By: Maurine Simmering M.D.   On: 05/15/2022  15:20   CT Head Wo Contrast  Result Date: 05/15/2022 CLINICAL DATA:  Concern for acute stroke EXAM: CT HEAD WITHOUT CONTRAST TECHNIQUE: Contiguous axial images were obtained from the base of the skull through the vertex without intravenous contrast. RADIATION DOSE REDUCTION: This exam was performed according to the departmental dose-optimization program which includes automated exposure control, adjustment of the mA and/or kV according to patient size and/or use of iterative reconstruction technique. COMPARISON:  None Available. FINDINGS: Brain: No acute intracranial hemorrhage. No focal mass lesion. No CT evidence of acute infarction. No midline shift or mass effect. No hydrocephalus. Basilar cisterns are patent. There are periventricular and subcortical white matter hypodensities. Generalized cortical atrophy. Vascular: No hyperdense vessel or unexpected calcification. Skull: Normal. Negative for fracture or focal lesion. Sinuses/Orbits: Paranasal sinuses and mastoid air cells are clear. Orbits are clear. Other: None. IMPRESSION: 1. No acute intracranial findings. 2. Atrophy and white matter microvascular disease. Electronically Signed   By: Suzy Bouchard M.D.   On: 05/15/2022 15:16   DG Abd Portable 1V  Result Date: 05/05/2022 CLINICAL DATA:  Abdominal discomfort. EXAM: PORTABLE ABDOMEN - 1 VIEW COMPARISON:  05/22/2021 and CT 05/02/2022 FINDINGS: Calcifications in the right abdomen may be associated with the known gallstones. Lung bases are clear. Nonobstructive bowel gas pattern. Surgical clip in the right hemipelvis. Limited evaluation for free air on the supine images. IMPRESSION: 1. Nonobstructive bowel gas pattern. 2. Cholelithiasis. Electronically Signed   By:  Markus Daft M.D.   On: 05/05/2022 17:08   CT ABDOMEN PELVIS W CONTRAST  Result Date: 05/02/2022 CLINICAL DATA:  Abdominal pain, acute. Nonlocalized. History of duodenal perforation. EXAM: CT ABDOMEN AND PELVIS WITH CONTRAST TECHNIQUE: Multidetector CT imaging of the abdomen and pelvis was performed using the standard protocol following bolus administration of intravenous contrast. RADIATION DOSE REDUCTION: This exam was performed according to the departmental dose-optimization program which includes automated exposure control, adjustment of the mA and/or kV according to patient size and/or use of iterative reconstruction technique. CONTRAST:  33mL OMNIPAQUE IOHEXOL 350 MG/ML SOLN COMPARISON:  05/17/2021 FINDINGS: Lower chest: No acute abnormality. Hepatobiliary: No suspicious liver abnormality. Small cyst within central left liver measures 0.8 cm and is unchanged from previous exam. Lobe of gallbladder appears distended. There are 2 no gallbladder wall thickening. No bile duct dilatation. Stones within the gallbladder measuring up to 2 cm. Pancreas: Cyst in uncinate process measures 8 mm and is unchanged from the previous exam. No main duct dilatation, inflammation or distension. Spleen: Normal in size without focal abnormality. Adrenals/Urinary Tract: Normal adrenal glands. No kidney mass, hydronephrosis, or nephrolithiasis. Urinary bladder is decompressed around a Foley catheter. Stomach/Bowel: There is wall thickening involving the pylorus and descending portion of the duodenum with surrounding soft tissue stranding. Transmural gas collection is identified within the anterior wall of the pylorus measuring 1.2 cm, image 34/3. On the previous exam this measures 1.1 cm. This may represent an area of transmural ulceration versus small duodenal diverticulum. No sign of perforation. The remaining small bowel loops are unremarkable. No pathologic dilatation of the large or small bowel loops. Sigmoid diverticulosis  without acute diverticulitis. Moderate retained stool noted within the colon. Vascular/Lymphatic: Aortic atherosclerosis. No enlarged abdominal or pelvic lymph nodes. Reproductive: Status post hysterectomy. No adnexal masses. Other: No free fluid or focal fluid collections. Musculoskeletal: Lumbar spondylosis and facet arthropathy. No acute or suspicious osseous findings. Postoperative changes from prior lumpectomy noted within the right breast. Soft tissue density nodule within the right lower breast  measures 1.7 cm, image 18/3 previously this measured 2.0 cm. IMPRESSION: 1. There is wall thickening involving the pylorus and descending portion of the duodenum with surrounding soft tissue stranding. Transmural gas collection is identified within the anterior wall of the pylorus measuring 1.2 cm. On the previous exam this measures 1.1 cm. This may represent an area of transmural ulceration versus small duodenal diverticulum. No sign of perforation. 2. Gallstones with distended gallbladder. No gallbladder wall thickening. 3. Sigmoid diverticulosis without acute diverticulitis. 4. Moderate retained stool noted within the colon. Correlate for any clinical signs or symptoms of constipation. 5. Stable 8 mm cystic lesion in the uncinate process of the pancreas. Given the patient's age, no further workup or follow-up is recommended. 6. Soft tissue density nodule within the right lower breast measures 1.7 cm previously this measured 2.0 cm. 7.  Aortic Atherosclerosis (ICD10-I70.0). Electronically Signed   By: Kerby Moors M.D.   On: 05/02/2022 10:39   DG CHEST PORT 1 VIEW  Result Date: 05/01/2022 CLINICAL DATA:  Leukocytosis. EXAM: PORTABLE CHEST 1 VIEW COMPARISON:  Chest radiograph dated 04/25/2022. FINDINGS: No focal consolidation, pleural effusion, pneumothorax. Linear density along the lateral left lung most consistent with a skin fold. Repeat radiograph with repositioning of the patient may provide better  evaluation. The cardiac silhouette is within normal limits. No acute osseous pathology. IMPRESSION: 1. No acute cardiopulmonary process. 2. Probable skin fold along the lateral left lung. Electronically Signed   By: Anner Crete M.D.   On: 05/01/2022 17:52    Assessment/Plan Nausea and vomiting, unspecified vomiting type  Nonintractable headache, unspecified chronicity pattern, unspecified headache type  Hypertension, unspecified type - Plan: losartan (COZAAR) 50 MG tablet  Essential hypertension Patient has had improved nausea/vomiting/and headaches on current regimen. PRN baclofen with significant improvement. Patient's BP elevated. Of note, BP meds previously included amlodipine 10 mg and Losartan 50 mg. Will increase labetalol and add on losartan. Renal function stable. Recheck in 2 weeks.   Family/ staff Communication: nursing  Labs/tests ordered:  BMP in 2 weeks.

## 2022-06-02 MED ORDER — LOSARTAN POTASSIUM 50 MG PO TABS: 50.0000 mg | ORAL_TABLET | Freq: Every day | ORAL | 4 refills | Status: DC

## 2022-06-08 ENCOUNTER — Observation Stay: Payer: Medicare Other

## 2022-06-08 ENCOUNTER — Inpatient Hospital Stay
Admission: EM | Admit: 2022-06-08 | Discharge: 2022-06-11 | DRG: 699 | Disposition: A | Discharge status: Skilled Nursing Facility | Payer: Medicare Other | Source: Skilled Nursing Facility | Attending: Internal Medicine | Admitting: Internal Medicine

## 2022-06-08 ENCOUNTER — Other Ambulatory Visit: Payer: Self-pay

## 2022-06-08 ENCOUNTER — Emergency Department: Payer: Medicare Other

## 2022-06-08 DIAGNOSIS — R54 Age-related physical debility: Secondary | ICD-10-CM | POA: Diagnosis present

## 2022-06-08 DIAGNOSIS — Z886 Allergy status to analgesic agent status: Secondary | ICD-10-CM

## 2022-06-08 DIAGNOSIS — Z978 Presence of other specified devices: Secondary | ICD-10-CM

## 2022-06-08 DIAGNOSIS — Z853 Personal history of malignant neoplasm of breast: Secondary | ICD-10-CM

## 2022-06-08 DIAGNOSIS — I452 Bifascicular block: Secondary | ICD-10-CM | POA: Diagnosis present

## 2022-06-08 DIAGNOSIS — L89151 Pressure ulcer of sacral region, stage 1: Secondary | ICD-10-CM | POA: Diagnosis present

## 2022-06-08 DIAGNOSIS — Z9181 History of falling: Secondary | ICD-10-CM

## 2022-06-08 DIAGNOSIS — M1990 Unspecified osteoarthritis, unspecified site: Secondary | ICD-10-CM | POA: Diagnosis present

## 2022-06-08 DIAGNOSIS — I639 Cerebral infarction, unspecified: Secondary | ICD-10-CM | POA: Diagnosis present

## 2022-06-08 DIAGNOSIS — R296 Repeated falls: Secondary | ICD-10-CM | POA: Diagnosis present

## 2022-06-08 DIAGNOSIS — S0990XA Unspecified injury of head, initial encounter: Secondary | ICD-10-CM

## 2022-06-08 DIAGNOSIS — I69354 Hemiplegia and hemiparesis following cerebral infarction affecting left non-dominant side: Secondary | ICD-10-CM

## 2022-06-08 DIAGNOSIS — Y92129 Unspecified place in nursing home as the place of occurrence of the external cause: Secondary | ICD-10-CM

## 2022-06-08 DIAGNOSIS — E785 Hyperlipidemia, unspecified: Secondary | ICD-10-CM | POA: Diagnosis present

## 2022-06-08 DIAGNOSIS — Y846 Urinary catheterization as the cause of abnormal reaction of the patient, or of later complication, without mention of misadventure at the time of the procedure: Secondary | ICD-10-CM | POA: Diagnosis present

## 2022-06-08 DIAGNOSIS — R410 Disorientation, unspecified: Secondary | ICD-10-CM | POA: Diagnosis present

## 2022-06-08 DIAGNOSIS — Z79899 Other long term (current) drug therapy: Secondary | ICD-10-CM

## 2022-06-08 DIAGNOSIS — K279 Peptic ulcer, site unspecified, unspecified as acute or chronic, without hemorrhage or perforation: Secondary | ICD-10-CM | POA: Diagnosis present

## 2022-06-08 DIAGNOSIS — I69393 Ataxia following cerebral infarction: Secondary | ICD-10-CM

## 2022-06-08 DIAGNOSIS — I110 Hypertensive heart disease with heart failure: Secondary | ICD-10-CM | POA: Diagnosis present

## 2022-06-08 DIAGNOSIS — R29704 NIHSS score 4: Secondary | ICD-10-CM | POA: Diagnosis present

## 2022-06-08 DIAGNOSIS — S0083XA Contusion of other part of head, initial encounter: Secondary | ICD-10-CM | POA: Diagnosis present

## 2022-06-08 DIAGNOSIS — M79672 Pain in left foot: Secondary | ICD-10-CM | POA: Diagnosis present

## 2022-06-08 DIAGNOSIS — H5789 Other specified disorders of eye and adnexa: Secondary | ICD-10-CM | POA: Diagnosis present

## 2022-06-08 DIAGNOSIS — I5032 Chronic diastolic (congestive) heart failure: Secondary | ICD-10-CM | POA: Diagnosis not present

## 2022-06-08 DIAGNOSIS — Z7982 Long term (current) use of aspirin: Secondary | ICD-10-CM

## 2022-06-08 DIAGNOSIS — E440 Moderate protein-calorie malnutrition: Secondary | ICD-10-CM | POA: Diagnosis present

## 2022-06-08 DIAGNOSIS — Z888 Allergy status to other drugs, medicaments and biological substances status: Secondary | ICD-10-CM

## 2022-06-08 DIAGNOSIS — G959 Disease of spinal cord, unspecified: Secondary | ICD-10-CM | POA: Diagnosis present

## 2022-06-08 DIAGNOSIS — Z66 Do not resuscitate: Secondary | ICD-10-CM | POA: Diagnosis present

## 2022-06-08 DIAGNOSIS — Z885 Allergy status to narcotic agent status: Secondary | ICD-10-CM

## 2022-06-08 DIAGNOSIS — N390 Urinary tract infection, site not specified: Secondary | ICD-10-CM | POA: Diagnosis not present

## 2022-06-08 DIAGNOSIS — W19XXXA Unspecified fall, initial encounter: Secondary | ICD-10-CM | POA: Diagnosis not present

## 2022-06-08 DIAGNOSIS — I951 Orthostatic hypotension: Secondary | ICD-10-CM | POA: Diagnosis present

## 2022-06-08 DIAGNOSIS — T83511A Infection and inflammatory reaction due to indwelling urethral catheter, initial encounter: Principal | ICD-10-CM | POA: Diagnosis present

## 2022-06-08 DIAGNOSIS — N319 Neuromuscular dysfunction of bladder, unspecified: Secondary | ICD-10-CM | POA: Diagnosis present

## 2022-06-08 DIAGNOSIS — I160 Hypertensive urgency: Secondary | ICD-10-CM | POA: Diagnosis present

## 2022-06-08 DIAGNOSIS — B9620 Unspecified Escherichia coli [E. coli] as the cause of diseases classified elsewhere: Secondary | ICD-10-CM | POA: Diagnosis present

## 2022-06-08 DIAGNOSIS — I63219 Cerebral infarction due to unspecified occlusion or stenosis of unspecified vertebral arteries: Secondary | ICD-10-CM

## 2022-06-08 DIAGNOSIS — A4150 Gram-negative sepsis, unspecified: Secondary | ICD-10-CM | POA: Insufficient documentation

## 2022-06-08 DIAGNOSIS — Z8249 Family history of ischemic heart disease and other diseases of the circulatory system: Secondary | ICD-10-CM

## 2022-06-08 DIAGNOSIS — H9190 Unspecified hearing loss, unspecified ear: Secondary | ICD-10-CM | POA: Diagnosis present

## 2022-06-08 LAB — URINALYSIS, ROUTINE W REFLEX MICROSCOPIC
Bacteria, UA: NONE SEEN
Bilirubin Urine: NEGATIVE
Glucose, UA: NEGATIVE mg/dL
Ketones, ur: NEGATIVE mg/dL
Nitrite: NEGATIVE
Protein, ur: 100 mg/dL — AB
RBC / HPF: >50 RBC/hpf (ref 0–5)
Specific Gravity, Urine: 1.023 (ref 1.005–1.030)
WBC, UA: >50 WBC/hpf (ref 0–5)
pH: 5 (ref 5.0–8.0)

## 2022-06-08 LAB — CBC WITH DIFFERENTIAL/PLATELET
Abs Immature Granulocytes: 0.1 10*3/uL — ABNORMAL HIGH (ref 0.00–0.07)
Basophils Absolute: 0.1 10*3/uL (ref 0.0–0.1)
Basophils Relative: 1 %
Eosinophils Absolute: 0.3 10*3/uL (ref 0.0–0.5)
Eosinophils Relative: 2 %
HCT: 38 % (ref 36.0–46.0)
Hemoglobin: 12 g/dL (ref 12.0–15.0)
Immature Granulocytes: 1 %
Lymphocytes Relative: 21 %
Lymphs Abs: 2.5 10*3/uL (ref 0.7–4.0)
MCH: 28.2 pg (ref 26.0–34.0)
MCHC: 31.6 g/dL (ref 30.0–36.0)
MCV: 89.2 fL (ref 80.0–100.0)
Monocytes Absolute: 0.8 10*3/uL (ref 0.1–1.0)
Monocytes Relative: 7 %
Neutro Abs: 8.2 10*3/uL — ABNORMAL HIGH (ref 1.7–7.7)
Neutrophils Relative %: 68 %
Platelets: 382 10*3/uL (ref 150–400)
RBC: 4.26 MIL/uL (ref 3.87–5.11)
RDW: 13.7 % (ref 11.5–15.5)
WBC: 12 10*3/uL — ABNORMAL HIGH (ref 4.0–10.5)
nRBC: 0 % (ref 0.0–0.2)

## 2022-06-08 LAB — BASIC METABOLIC PANEL
Anion gap: 11 (ref 5–15)
BUN: 18 mg/dL (ref 8–23)
CO2: 23 mmol/L (ref 22–32)
Calcium: 9.5 mg/dL (ref 8.9–10.3)
Chloride: 103 mmol/L (ref 98–111)
Creatinine, Ser: 0.62 mg/dL (ref 0.44–1.00)
GFR, Estimated: >60 mL/min (ref >60)
Glucose, Bld: 135 mg/dL — ABNORMAL HIGH (ref 70–99)
Potassium: 4 mmol/L (ref 3.5–5.1)
Sodium: 137 mmol/L (ref 135–145)

## 2022-06-08 LAB — PROCALCITONIN: Procalcitonin: <0.1 ng/mL

## 2022-06-08 LAB — LACTIC ACID, PLASMA: Lactic Acid, Venous: 3.2 mmol/L (ref 0.5–1.9)

## 2022-06-08 LAB — BRAIN NATRIURETIC PEPTIDE: B Natriuretic Peptide: 14.1 pg/mL (ref 0.0–100.0)

## 2022-06-08 MED ORDER — LACTATED RINGERS IV BOLUS
1000.0000 mL | Freq: Once | INTRAVENOUS | Status: AC
  Administered 2022-06-08: 1000 mL via INTRAVENOUS

## 2022-06-08 MED ORDER — CYANOCOBALAMIN 500 MCG PO TABS
1000.0000 ug | ORAL_TABLET | Freq: Every day | ORAL | Status: DC
  Administered 2022-06-09 – 2022-06-11 (×3): 1000 ug via ORAL
  Filled 2022-06-08 (×3): qty 2

## 2022-06-08 MED ORDER — ACETAMINOPHEN 325 MG PO TABS
650.0000 mg | ORAL_TABLET | Freq: Four times a day (QID) | ORAL | Status: DC | PRN
  Administered 2022-06-08 – 2022-06-10 (×3): 650 mg via ORAL
  Filled 2022-06-08 (×3): qty 2

## 2022-06-08 MED ORDER — HYDRALAZINE HCL 20 MG/ML IJ SOLN
5.0000 mg | INTRAMUSCULAR | Status: DC | PRN
  Administered 2022-06-09 – 2022-06-10 (×2): 5 mg via INTRAVENOUS
  Filled 2022-06-08 (×2): qty 1

## 2022-06-08 MED ORDER — POLYETHYLENE GLYCOL 3350 17 G PO PACK: 17.0000 g | PACK | Freq: Two times a day (BID) | ORAL | Status: DC | PRN

## 2022-06-08 MED ORDER — BOOST PO LIQD
237.0000 mL | Freq: Three times a day (TID) | ORAL | Status: DC
  Administered 2022-06-08 – 2022-06-11 (×9): 237 mL via ORAL
  Filled 2022-06-08: qty 237

## 2022-06-08 MED ORDER — ACETAMINOPHEN 500 MG PO TABS
1000.0000 mg | ORAL_TABLET | Freq: Once | ORAL | Status: AC
  Administered 2022-06-08: 1000 mg via ORAL
  Filled 2022-06-08: qty 2

## 2022-06-08 MED ORDER — TAMSULOSIN HCL 0.4 MG PO CAPS
0.4000 mg | ORAL_CAPSULE | Freq: Every day | ORAL | Status: DC
  Administered 2022-06-08 – 2022-06-11 (×4): 0.4 mg via ORAL
  Filled 2022-06-08 (×4): qty 1

## 2022-06-08 MED ORDER — BACLOFEN 10 MG PO TABS
5.0000 mg | ORAL_TABLET | Freq: Two times a day (BID) | ORAL | Status: DC | PRN
  Administered 2022-06-10: 5 mg via ORAL
  Filled 2022-06-08: qty 1

## 2022-06-08 MED ORDER — CLONIDINE HCL 0.1 MG PO TABS
0.1000 mg | ORAL_TABLET | Freq: Once | ORAL | Status: AC
  Administered 2022-06-08: 0.1 mg via ORAL
  Filled 2022-06-08: qty 1

## 2022-06-08 MED ORDER — ATORVASTATIN CALCIUM 20 MG PO TABS
40.0000 mg | ORAL_TABLET | Freq: Every day | ORAL | Status: DC
  Administered 2022-06-08 – 2022-06-11 (×4): 40 mg via ORAL
  Filled 2022-06-08 (×4): qty 2

## 2022-06-08 MED ORDER — ASPIRIN 81 MG PO TBEC
81.0000 mg | DELAYED_RELEASE_TABLET | Freq: Every day | ORAL | Status: DC
  Administered 2022-06-08 – 2022-06-09 (×2): 81 mg via ORAL
  Filled 2022-06-08 (×2): qty 1

## 2022-06-08 MED ORDER — LOPERAMIDE HCL 2 MG PO CAPS: 2.0000 mg | ORAL_CAPSULE | Freq: Four times a day (QID) | ORAL | Status: DC | PRN

## 2022-06-08 MED ORDER — LABETALOL HCL 100 MG PO TABS
100.0000 mg | ORAL_TABLET | Freq: Every day | ORAL | Status: DC
  Administered 2022-06-08 – 2022-06-11 (×4): 100 mg via ORAL
  Filled 2022-06-08 (×5): qty 1

## 2022-06-08 MED ORDER — ONDANSETRON HCL 4 MG/2ML IJ SOLN
4.0000 mg | Freq: Three times a day (TID) | INTRAMUSCULAR | Status: DC | PRN
  Administered 2022-06-09: 4 mg via INTRAVENOUS
  Filled 2022-06-08: qty 2

## 2022-06-08 MED ORDER — SODIUM CHLORIDE 0.9 % IV SOLN
1.0000 g | Freq: Once | INTRAVENOUS | Status: AC
  Administered 2022-06-08: 1 g via INTRAVENOUS
  Filled 2022-06-08: qty 10

## 2022-06-08 MED ORDER — VITAMIN D 25 MCG (1000 UNIT) PO TABS
1000.0000 [IU] | ORAL_TABLET | Freq: Every day | ORAL | Status: DC
  Administered 2022-06-08 – 2022-06-11 (×4): 1000 [IU] via ORAL
  Filled 2022-06-08 (×4): qty 1

## 2022-06-08 MED ORDER — SODIUM CHLORIDE 0.9 % IV SOLN
1.0000 g | INTRAVENOUS | Status: AC
  Administered 2022-06-09 – 2022-06-10 (×2): 1 g via INTRAVENOUS
  Filled 2022-06-08: qty 10
  Filled 2022-06-08: qty 1

## 2022-06-08 MED ORDER — CLOPIDOGREL BISULFATE 75 MG PO TABS
75.0000 mg | ORAL_TABLET | Freq: Every day | ORAL | Status: DC
  Administered 2022-06-09: 75 mg via ORAL
  Filled 2022-06-08: qty 1

## 2022-06-08 MED ORDER — SODIUM CHLORIDE 0.9 % IV SOLN: INTRAVENOUS | Status: DC

## 2022-06-08 MED ORDER — PANTOPRAZOLE SODIUM 40 MG PO TBEC
40.0000 mg | DELAYED_RELEASE_TABLET | Freq: Every day | ORAL | Status: DC
  Administered 2022-06-08 – 2022-06-11 (×4): 40 mg via ORAL
  Filled 2022-06-08 (×4): qty 1

## 2022-06-08 NOTE — H&P (Signed)
History and Physical    AMIA RYNDERS ZOX:[id number] DOB: 04-01-1930 DOA: 06/08/2022  Referring MD/NP/PA:   PCP: Danella Penton, MD   Patient coming from:  The patient is coming from SNF  Chief Complaint: fall  HPI: Sherry [name] is a 89 y.o. female with medical history significant of indwelling Foley cath (initially placed in the setting of urinary retention secondary to CVA), hypertension, hyperlipidemia, diastolic CHF, stroke, anxiety, duodenitis, peptic ulcer disease, breast cancer, C1 cervical fracture, who presents with fall.  Patient was recently hospitalized from 3/16 - 3/22 due to complicated UTI.  Urine culture was positive for E. coli which was sensitive for Rocephin. Per report, pt had an unwitnessed fall in SNF. Pt states she fell forward, and hit the front of her head. She developed hematoma in the right forehead.  He has pain in the left foot and left ankle, which seem to be moderate. Patient does not have chest pain, cough, shortness breath.  No fever or chills.  Patient has nausea and vomited once, currently no nausea vomiting, diarrhea or abdominal pain.  Patient denies symptoms of UTI. She has an indwelling Foley catheter draining cloudy urine. Foley cath was replaced and urinalysis is done from the fresh Foley catheter in ED.  Data reviewed independently and ED Course: pt was found to have WBC 12.0, positive urinalysis (turbid appearance, large amount of leukocyte, negative bacteria, WBC > 50), lactic acid 3.2, GFR> 60.  Temperature normal, blood pressure 2 1/100 which improved to 151/88 after giving 0.1 mg of clonidine in ED. CT head is negative for acute intracranial abnormalities, but showed large right forehead hematoma.  CT of C-spine is negative for acute injury, but showed old C1 fracture.  Patient is placed on TeleMed for observation.   EKG: I have personally reviewed.  Sinus rhythm, QTc 476, bifascicular block, poor R wave progression   Review of Systems:    General: no fevers, chills, no body weight gain, has fatigue HEENT: no blurry vision, hearing changes or sore throat Respiratory: no dyspnea, coughing, wheezing CV: no chest pain, no palpitations GI: no nausea, vomiting, abdominal pain, diarrhea, constipation GU: no dysuria, burning on urination, increased urinary frequency, hematuria  Ext: no leg edema Neuro: no unilateral weakness, numbness, or tingling, no vision change or hearing loss. Has fall. Skin: has small skin tear and hematoma in right forehead MSK: No muscle spasm, no deformity, no limitation of range of movement in spin. Has pain in left foot and ankle. Heme: No easy bruising.  Travel history: No recent long distant travel.   Allergy:  Allergies  Allergen Reactions   Morphine     Other reaction(s): Other (See Comments) Apnea with large dose Respiratory Distress    Buprenorphine Hcl     Other reaction(s): Other (See Comments), Other (See Comments) Respiratory Distress Respiratory Distress    Codeine    Epinephrine     Other reaction(s): Other (See Comments) Severe anxiety; "makes my heart race" - would like to avoid during surgery   Morphine And Related Other (See Comments)    Respiratory Distress    Percocet [Oxycodone-Acetaminophen]     Past Medical History:  Diagnosis Date   Actinic keratosis    Breast cancer    C1 cervical fracture    Hypertension    Osteoarthritis     Past Surgical History:  Procedure Laterality Date   BACK SURGERY     BIOPSY  05/03/2022   Procedure: BIOPSY;  Surgeon: Benancio Deeds,  MD;  Location: [facility] ENDOSCOPY;  Service: Gastroenterology;;   ESOPHAGOGASTRODUODENOSCOPY (EGD) WITH PROPOFOL N/A 05/03/2022   Procedure: ESOPHAGOGASTRODUODENOSCOPY (EGD) WITH PROPOFOL;  Surgeon: Benancio Deeds, MD;  Location: [facility] ENDOSCOPY;  Service: Gastroenterology;  Laterality: N/A;   HEMOSTASIS CONTROL  05/03/2022   Procedure: HEMOSTASIS CONTROL;  Surgeon: Benancio Deeds, MD;   Location: [facility] ENDOSCOPY;  Service: Gastroenterology;;   IR INJECT/THERA/INC NEEDLE/CATH/PLC EPI/LUMB/SAC [facility]  04/29/2021   MOHS SURGERY      Social History:  reports that she has never smoked. She has never been exposed to tobacco smoke. She has never used smokeless tobacco. She reports current alcohol use. She reports that she does not use drugs.  Family History:  Family History  Problem Relation Age of Onset   Hypertension Father      Prior to Admission medications   Medication Sig Start Date End Date Taking? Authorizing Provider  acetaminophen (TYLENOL) 325 MG tablet Take 2 tablets (650 mg total) by mouth every 6 (six) hours as needed for headache. 05/12/22   Hollice Espy, MD  aspirin EC 81 MG tablet Take 1 tablet (81 mg total) by mouth daily. Swallow whole. 05/13/22   Hollice Espy, MD  atorvastatin (LIPITOR) 40 MG tablet Take 1 tablet (40 mg total) by mouth daily. 05/13/22   Hollice Espy, MD  Baclofen 5 MG TABS Take 1 tablet (5 mg total) by mouth 2 (two) times daily as needed (HEADACHES AND HICCUPS). 05/26/22   Earnestine Mealing, MD  cholecalciferol (VITAMIN D) 25 MCG (1000 UNIT) tablet Take 2,000 Units by mouth daily.    [provider]  clopidogrel (PLAVIX) 75 MG tablet Take 1 tablet (75 mg total) by mouth daily. 05/22/22   Kathlen Mody, MD  Cyanocobalamin (B-12) 1000 MCG CAPS Take 2,000 mcg by mouth daily.    [provider]  haloperidol (HALDOL) 1 MG tablet Take 1 tablet (1 mg total) by mouth every 6 (six) hours as needed (nausea vomiting.). 05/28/22   Earnestine Mealing, MD  labetalol (NORMODYNE) 100 MG tablet Take 200 mg by mouth 2 (two) times daily.    [provider]  lactose free nutrition (BOOST) LIQD Take 237 mLs by mouth 3 (three) times daily between meals.    [provider]  loperamide (IMODIUM A-D) 2 MG tablet Take 1 tablet (2 mg total) by mouth 4 (four) times daily as needed for diarrhea or loose stools. 05/28/22   Earnestine Mealing, MD  losartan (COZAAR) 50 MG tablet Take 1 tablet (50 mg total) by mouth daily. 06/02/22   Earnestine Mealing, MD  ondansetron (ZOFRAN) 4 MG tablet Take one tablet by mouth every 6 hours as needed.    [provider]  pantoprazole (PROTONIX) 40 MG tablet Take 1 tablet (40 mg total) by mouth 2 (two) times daily for 21 days, THEN 1 tablet (40 mg total) daily. 05/12/22 07/02/22  Hollice Espy, MD  promethazine (PHENERGAN) 12.5 MG tablet Take 1 tablet (12.5 mg total) by mouth every 8 (eight) hours as needed for nausea or vomiting. 05/28/22   Earnestine Mealing, MD  tamsulosin (FLOMAX) 0.4 MG CAPS capsule Take 1 capsule (0.4 mg total) by mouth daily. 05/12/22   Hollice Espy, MD    Physical Exam: Vitals:   06/08/22 1400 06/08/22 1433 06/08/22 1503 06/08/22 1507  BP: (!) 166/103  (!) 173/77 (!) 145/80  Pulse: 81   81  Resp: (!) 25 20  16   Temp:    98.3 F (36.8 C)  TempSrc:    Oral  SpO2: 99%   99%   General: Not in acute distress HEENT:       Eyes: PERRL, EOMI, no scleral icterus.       ENT: No discharge from the ears and nose, no pharynx injection, no tonsillar enlargement.        Neck: No JVD, no bruit, no mass felt. Heme: No neck lymph node enlargement. Cardiac: S1/S2, RRR, No murmurs, No gallops or rubs. Respiratory: No rales, wheezing, rhonchi or rubs. GI: Soft, nondistended, nontender, no rebound pain, no organomegaly, BS present. GU: No hematuria Ext: No pitting leg edema bilaterally. 1+DP/PT pulse bilaterally. Musculoskeletal: No joint deformities, No joint redness or warmth, no limitation of ROM in spin. Has tenderness in left foot and ankle. Skin: has a small skin tear and hematoma in right forehead Neuro: Alert, oriented to place and person, she is not sure aboutyear 2024.  Cranial nerves II-XII grossly intact, moves all extremities.  Psych: Patient is not psychotic, no suicidal or hemocidal ideation.  Labs on Admission: I have personally reviewed following  labs and imaging studies  CBC: Recent Labs  Lab 06/08/22 0140  WBC 12.0*  NEUTROABS 8.2*  HGB 12.0  HCT 38.0  MCV 89.2  PLT 382   Basic Metabolic Panel: Recent Labs  Lab 06/08/22 0140  NA 137  K 4.0  CL 103  CO2 23  GLUCOSE 135*  BUN 18  CREATININE 0.62  CALCIUM 9.5   GFR: Estimated Creatinine Clearance: 37.7 mL/min (by C-G formula based on SCr of 0.62 mg/dL). Liver Function Tests: No results for input(s): "AST", "ALT", "ALKPHOS", "BILITOT", "PROT", "ALBUMIN" in the last 168 hours. No results for input(s): "LIPASE", "AMYLASE" in the last 168 hours. No results for input(s): "AMMONIA" in the last 168 hours. Coagulation Profile: No results for input(s): "INR", "PROTIME" in the last 168 hours. Cardiac Enzymes: No results for input(s): "CKTOTAL", "CKMB", "CKMBINDEX", "TROPONINI" in the last 168 hours. BNP (last 3 results) No results for input(s): "PROBNP" in the last 8760 hours. HbA1C: No results for input(s): "HGBA1C" in the last 72 hours. CBG: No results for input(s): "GLUCAP" in the last 168 hours. Lipid Profile: No results for input(s): "CHOL", "HDL", "LDLCALC", "TRIG", "CHOLHDL", "LDLDIRECT" in the last 72 hours. Thyroid Function Tests: No results for input(s): "TSH", "T4TOTAL", "FREET4", "T3FREE", "THYROIDAB" in the last 72 hours. Anemia Panel: No results for input(s): "VITAMINB12", "FOLATE", "FERRITIN", "TIBC", "IRON", "RETICCTPCT" in the last 72 hours. Urine analysis:    Component Value Date/Time   COLORURINE YELLOW (A) 06/08/2022 0457   APPEARANCEUR TURBID (A) 06/08/2022 0457   LABSPEC 1.023 06/08/2022 0457   PHURINE 5.0 06/08/2022 0457   GLUCOSEU NEGATIVE 06/08/2022 0457   HGBUR MODERATE (A) 06/08/2022 0457   BILIRUBINUR NEGATIVE 06/08/2022 0457   KETONESUR NEGATIVE 06/08/2022 0457   PROTEINUR 100 (A) 06/08/2022 0457   NITRITE NEGATIVE 06/08/2022 0457   LEUKOCYTESUR LARGE (A) 06/08/2022 0457   Sepsis  Labs: @LABRCNTIP (procalcitonin:4,lacticidven:4) )No results found for this or any previous visit (from the past 240 hour(s)).   Radiological Exams on Admission: DG Foot 2 Views Left  Result Date: 06/08/2022 CLINICAL DATA:  Fall with abrasion to the left lateral foot and ankle associated with swelling EXAM: LEFT FOOT - 2 VIEW; LEFT ANKLE - 2 VIEW COMPARISON:  Left ankle radiograph dated 04/26/2021 FINDINGS: There are no findings of fracture or dislocation. No joint effusion. Degenerative changes of the foot. Ankle mortise is intact. Soft tissues are unremarkable. IMPRESSION: No acute fracture  or dislocation. Electronically Signed   By: Agustin Cree M.D.   On: 06/08/2022 10:34   DG Ankle 2 Views Left  Result Date: 06/08/2022 CLINICAL DATA:  Fall with abrasion to the left lateral foot and ankle associated with swelling EXAM: LEFT FOOT - 2 VIEW; LEFT ANKLE - 2 VIEW COMPARISON:  Left ankle radiograph dated 04/26/2021 FINDINGS: There are no findings of fracture or dislocation. No joint effusion. Degenerative changes of the foot. Ankle mortise is intact. Soft tissues are unremarkable. IMPRESSION: No acute fracture or dislocation. Electronically Signed   By: Agustin Cree M.D.   On: 06/08/2022 10:34   CT HEAD WO CONTRAST ( )  Result Date: 06/08/2022 CLINICAL DATA:  Unwitnessed fall. Large hematoma above the right eyebrow. Vomiting in triage. EXAM: CT HEAD WITHOUT CONTRAST CT CERVICAL SPINE WITHOUT CONTRAST TECHNIQUE: Multidetector CT imaging of the head and cervical spine was performed following the standard protocol without intravenous contrast. Multiplanar CT image reconstructions of the cervical spine were also generated. RADIATION DOSE REDUCTION: This exam was performed according to the departmental dose-optimization program which includes automated exposure control, adjustment of the mA and/or kV according to patient size and/or use of iterative reconstruction technique. COMPARISON:  MRI head 05/18/2022 and MRI  cervical spine 12/30/2021; CT head and cervical spine 12/30/2021 FINDINGS: CT HEAD FINDINGS Brain: No intracranial hemorrhage, mass effect, or evidence of acute infarct. No hydrocephalus. No extra-axial fluid collection. Generalized cerebral atrophy. Ill-defined hypoattenuation within the cerebral white matter is nonspecific but consistent with chronic small vessel ischemic disease. Chronic left cerebellar and right frontal lobe infarcts. Chronic lacunar infarct left basal ganglia. Vascular: No hyperdense vessel. Intracranial arterial calcification. Skull: Large hematoma about the right forehead. No underlying calvarial fracture. Sinuses/Orbits: No acute finding. Mild mucosal thickening left sphenoid sinus. Opacification of a few right mastoid air cells. Other: None. CT CERVICAL SPINE FINDINGS Alignment: No evidence of traumatic malalignment. Unchanged multilevel grade 1 anterolisthesis of C2, C3, C4 and C7. Skull base and vertebrae: No acute fracture. Redemonstrated type 2 dens fracture and mildly displaced bilateral posterior arch fractures of C1. The dens fracture appears slightly more distracted and angulated posteriorly. No evidence of spinal canal narrowing. No acute fracture. Soft tissues and spinal canal: No prevertebral fluid or swelling. No visible canal hematoma. Disc levels: Advanced multilevel spondylosis, disc space height loss, degenerative endplate changes greatest at C5-C6 and C6-C7. Multilevel advanced facet arthropathy. Posterior disc osteophyte complexes C5-C6 and C6-C7 cause mild-moderate effacement of the ventral thecal sac. Uncovertebral spurring and facet arthropathy cause advanced multilevel neural foraminal narrowing. Upper chest: No acute abnormality. Other: None. IMPRESSION: 1. No acute intracranial abnormality. 2. Large scalp hematoma about the right forehead. No calvarial fracture. 3. No acute fracture in the cervical spine. Redemonstrated type 2 dens fracture and bilateral posterior  arch of C1 fractures. Electronically Signed   By: Minerva Fester M.D.   On: 06/08/2022 02:06   CT Cervical Spine Wo Contrast  Result Date: 06/08/2022 CLINICAL DATA:  Unwitnessed fall. Large hematoma above the right eyebrow. Vomiting in triage. EXAM: CT HEAD WITHOUT CONTRAST CT CERVICAL SPINE WITHOUT CONTRAST TECHNIQUE: Multidetector CT imaging of the head and cervical spine was performed following the standard protocol without intravenous contrast. Multiplanar CT image reconstructions of the cervical spine were also generated. RADIATION DOSE REDUCTION: This exam was performed according to the departmental dose-optimization program which includes automated exposure control, adjustment of the mA and/or kV according to patient size and/or use of iterative reconstruction technique. COMPARISON:  MRI head 05/18/2022  and MRI cervical spine 12/30/2021; CT head and cervical spine 12/30/2021 FINDINGS: CT HEAD FINDINGS Brain: No intracranial hemorrhage, mass effect, or evidence of acute infarct. No hydrocephalus. No extra-axial fluid collection. Generalized cerebral atrophy. Ill-defined hypoattenuation within the cerebral white matter is nonspecific but consistent with chronic small vessel ischemic disease. Chronic left cerebellar and right frontal lobe infarcts. Chronic lacunar infarct left basal ganglia. Vascular: No hyperdense vessel. Intracranial arterial calcification. Skull: Large hematoma about the right forehead. No underlying calvarial fracture. Sinuses/Orbits: No acute finding. Mild mucosal thickening left sphenoid sinus. Opacification of a few right mastoid air cells. Other: None. CT CERVICAL SPINE FINDINGS Alignment: No evidence of traumatic malalignment. Unchanged multilevel grade 1 anterolisthesis of C2, C3, C4 and C7. Skull base and vertebrae: No acute fracture. Redemonstrated type 2 dens fracture and mildly displaced bilateral posterior arch fractures of C1. The dens fracture appears slightly more  distracted and angulated posteriorly. No evidence of spinal canal narrowing. No acute fracture. Soft tissues and spinal canal: No prevertebral fluid or swelling. No visible canal hematoma. Disc levels: Advanced multilevel spondylosis, disc space height loss, degenerative endplate changes greatest at C5-C6 and C6-C7. Multilevel advanced facet arthropathy. Posterior disc osteophyte complexes C5-C6 and C6-C7 cause mild-moderate effacement of the ventral thecal sac. Uncovertebral spurring and facet arthropathy cause advanced multilevel neural foraminal narrowing. Upper chest: No acute abnormality. Other: None. IMPRESSION: 1. No acute intracranial abnormality. 2. Large scalp hematoma about the right forehead. No calvarial fracture. 3. No acute fracture in the cervical spine. Redemonstrated type 2 dens fracture and bilateral posterior arch of C1 fractures. Electronically Signed   By: Minerva Fester M.D.   On: 06/08/2022 02:06      Assessment/Plan Principal Problem:   Complicated UTI (urinary tract infection) Active Problems:   Fall   Hyperlipidemia   Hypertensive urgency   Chronic diastolic CHF (congestive heart failure)   CVA (cerebral vascular accident)   Peptic ulcer   Protein-calorie malnutrition, moderate   Assessment and Plan:   Complicated UTI (urinary tract infection): Indwelling Foley catheter is replaced.  Patient has elevated lactic acid 3.2, but no fever, WBC is 12.0, clinically does not seem to have sepsis.  -Placed on TeleMed follow-up patient -IV Rocephin -Follow-up of blood culture and urine culture -IV fluid: 1 L LR, then 75 cc/h of normal saline -Lactic acid level  Fall -Fall precaution -PT/OT  Hyperlipidemia -Lipitor  Hypertensive urgency: Blood pressure 201/100 --> 151/88 -IV hydralazine as needed -Labetalol  Chronic diastolic CHF (congestive heart failure): 2D echo on 04/26/22 showed EF of 70-75% with grade 1 diastolic dysfunction.  Patient does not have leg  edema JVD.  CHF is compensated.  BNP 14.0 -Watch volume status closely  CVA (cerebral vascular accident) -Aspirin, Plavix, Lipitor  Peptic ulcer -Protonix  Protein-calorie malnutrition, moderate: Body weight 54.3 kg -Nutrition boost -Nutrition consult   DVT ppx: SCD  Code Status: DNR (pt has MOST form in file)  Family Communication: I have tried to call her husband who did not pick up the phone.  I left a message to him  Disposition Plan:  Anticipate discharge back to previous environment, SNF  Consults called:  none  Admission status and Level of care: Telemetry Cardiac:   for obs    Dispo: The patient is from: SNF              Anticipated d/c is to: SNF              Anticipated d/c date is: 1 day  Patient currently is not medically stable to d/c.    Severity of Illness:  The appropriate patient status for this patient is OBSERVATION. Observation status is judged to be reasonable and necessary in order to provide the required intensity of service to ensure the patient's safety. The patient's presenting symptoms, physical exam findings, and initial radiographic and laboratory data in the context of their medical condition is felt to place them at decreased risk for further clinical deterioration. Furthermore, it is anticipated that the patient will be medically stable for discharge from the hospital within 2 midnights of admission.        Date of Service 06/08/2022    Lorretta HarpXilin Nainika Newlun [facility]   If 7PM-7AM, please contact night-coverage [email or web address] 06/08/2022, 7:25 PM

## 2022-06-08 NOTE — ED Notes (Signed)
Dr Katrinka Blazing at bedside and pt transported to CT

## 2022-06-08 NOTE — ED Notes (Signed)
XR at bedside

## 2022-06-08 NOTE — ED Notes (Signed)
This nurse went to patient room to introduce self and complete shift assessment. Pt sleeping comfortably in no distress. Pt with R periorbital ecchymosis and hematoma above R eyebrow.

## 2022-06-08 NOTE — ED Provider Notes (Signed)
[facility] Provider Note    Event Date/Time   First MD Initiated Contact with Patient 06/08/22 0122     (approximate)   History   Fall and Head Injury   HPI  Sherry [name] is a 89 y.o. female who presents to the ED for evaluation of Fall and Head Injury   I reviewed medical DC summary from 3/22.  History of HTN and stroke on DAPT without anticoagulation.  She presents to the ED from a local SNF for evaluation of a fall and head injury.  She presents with a Foley catheter in place that has been present since this hospital admission at least 2 or 3 weeks ago.  She reports feeling weak and having a headache but history is limited from her due to her disorientation.   Physical Exam   Triage Vital Signs: ED Triage Vitals  Enc Vitals Group     BP 06/08/22 0111 (!) 201/100     Pulse Rate 06/08/22 0111 92     Resp 06/08/22 0111 (!) 24     Temp 06/08/22 0111 98 F (36.7 C)     Temp Source 06/08/22 0111 Oral     SpO2 06/08/22 0111 100 %     Weight --      Height --      Head Circumference --      Peak Flow --      Pain Score 06/08/22 0113 8     Pain Loc --      Pain Edu? --      Excl. in GC? --     Most recent vital signs: Vitals:   06/08/22 0500 06/08/22 0601  BP: (!) 183/78 (!) 146/91  Pulse: 81 73  Resp: 10 14  Temp:  98.2 F (36.8 C)  SpO2: 99% 98%    General: Awake, no distress.  CV:  Good peripheral perfusion.  Resp:  Normal effort.  Abd:  No distention.  Suprapubic tenderness is present.  Indwelling Foley catheter from her facility is in place draining cloudy yellow urine MSK:  No deformity noted.  Nontender extremities and back without overt signs of trauma Neuro:  No focal deficits appreciated. Other:  Large right-sided frontal hematoma without signs of EOM entrapment or open globe.   ED Results / Procedures / Treatments   Labs (all labs ordered are listed, but only abnormal results are displayed) Labs Reviewed  CBC  WITH DIFFERENTIAL/PLATELET - Abnormal; Notable for the following components:      Result Value   WBC 12.0 (*)    Neutro Abs 8.2 (*)    Abs Immature Granulocytes 0.10 (*)    All other components within normal limits  BASIC METABOLIC PANEL - Abnormal; Notable for the following components:   Glucose, Bld 135 (*)    All other components within normal limits  URINALYSIS, ROUTINE W REFLEX MICROSCOPIC - Abnormal; Notable for the following components:   Color, Urine YELLOW (*)    APPearance TURBID (*)    Hgb urine dipstick MODERATE (*)    Protein, ur 100 (*)    Leukocytes,Ua LARGE (*)    Non Squamous Epithelial PRESENT (*)    All other components within normal limits  URINE CULTURE  CULTURE, BLOOD (ROUTINE X 2)  CULTURE, BLOOD (ROUTINE X 2)  LACTIC ACID, PLASMA  LACTIC ACID, PLASMA  PROCALCITONIN    EKG Sinus rhythm with a rate of 88 bpm.  Normal axis.  Incomplete right bundle.  No STEMI.  RADIOLOGY CT head interpreted by me without evidence of acute intracranial pathology CT cervical spine interpreted by me without evidence of fracture or dislocation  Official radiology report(s): CT HEAD WO CONTRAST (5MM)  Result Date: 06/08/2022 CLINICAL DATA:  Unwitnessed fall. Large hematoma above the right eyebrow. Vomiting in triage. EXAM: CT HEAD WITHOUT CONTRAST CT CERVICAL SPINE WITHOUT CONTRAST TECHNIQUE: Multidetector CT imaging of the head and cervical spine was performed following the standard protocol without intravenous contrast. Multiplanar CT image reconstructions of the cervical spine were also generated. RADIATION DOSE REDUCTION: This exam was performed according to the departmental dose-optimization program which includes automated exposure control, adjustment of the mA and/or kV according to patient size and/or use of iterative reconstruction technique. COMPARISON:  MRI head 05/18/2022 and MRI cervical spine 12/30/2021; CT head and cervical spine 12/30/2021 FINDINGS: CT HEAD FINDINGS  Brain: No intracranial hemorrhage, mass effect, or evidence of acute infarct. No hydrocephalus. No extra-axial fluid collection. Generalized cerebral atrophy. Ill-defined hypoattenuation within the cerebral white matter is nonspecific but consistent with chronic small vessel ischemic disease. Chronic left cerebellar and right frontal lobe infarcts. Chronic lacunar infarct left basal ganglia. Vascular: No hyperdense vessel. Intracranial arterial calcification. Skull: Large hematoma about the right forehead. No underlying calvarial fracture. Sinuses/Orbits: No acute finding. Mild mucosal thickening left sphenoid sinus. Opacification of a few right mastoid air cells. Other: None. CT CERVICAL SPINE FINDINGS Alignment: No evidence of traumatic malalignment. Unchanged multilevel grade 1 anterolisthesis of C2, C3, C4 and C7. Skull base and vertebrae: No acute fracture. Redemonstrated type 2 dens fracture and mildly displaced bilateral posterior arch fractures of C1. The dens fracture appears slightly more distracted and angulated posteriorly. No evidence of spinal canal narrowing. No acute fracture. Soft tissues and spinal canal: No prevertebral fluid or swelling. No visible canal hematoma. Disc levels: Advanced multilevel spondylosis, disc space height loss, degenerative endplate changes greatest at C5-C6 and C6-C7. Multilevel advanced facet arthropathy. Posterior disc osteophyte complexes C5-C6 and C6-C7 cause mild-moderate effacement of the ventral thecal sac. Uncovertebral spurring and facet arthropathy cause advanced multilevel neural foraminal narrowing. Upper chest: No acute abnormality. Other: None. IMPRESSION: 1. No acute intracranial abnormality. 2. Large scalp hematoma about the right forehead. No calvarial fracture. 3. No acute fracture in the cervical spine. Redemonstrated type 2 dens fracture and bilateral posterior arch of C1 fractures. Electronically Signed   By: Minerva Festeryler  Stutzman M.D.   On: 06/08/2022 02:06    CT Cervical Spine Wo Contrast  Result Date: 06/08/2022 CLINICAL DATA:  Unwitnessed fall. Large hematoma above the right eyebrow. Vomiting in triage. EXAM: CT HEAD WITHOUT CONTRAST CT CERVICAL SPINE WITHOUT CONTRAST TECHNIQUE: Multidetector CT imaging of the head and cervical spine was performed following the standard protocol without intravenous contrast. Multiplanar CT image reconstructions of the cervical spine were also generated. RADIATION DOSE REDUCTION: This exam was performed according to the departmental dose-optimization program which includes automated exposure control, adjustment of the mA and/or kV according to patient size and/or use of iterative reconstruction technique. COMPARISON:  MRI head 05/18/2022 and MRI cervical spine 12/30/2021; CT head and cervical spine 12/30/2021 FINDINGS: CT HEAD FINDINGS Brain: No intracranial hemorrhage, mass effect, or evidence of acute infarct. No hydrocephalus. No extra-axial fluid collection. Generalized cerebral atrophy. Ill-defined hypoattenuation within the cerebral white matter is nonspecific but consistent with chronic small vessel ischemic disease. Chronic left cerebellar and right frontal lobe infarcts. Chronic lacunar infarct left basal ganglia. Vascular: No hyperdense vessel. Intracranial arterial calcification. Skull: Large hematoma about the right forehead.  No underlying calvarial fracture. Sinuses/Orbits: No acute finding. Mild mucosal thickening left sphenoid sinus. Opacification of a few right mastoid air cells. Other: None. CT CERVICAL SPINE FINDINGS Alignment: No evidence of traumatic malalignment. Unchanged multilevel grade 1 anterolisthesis of C2, C3, C4 and C7. Skull base and vertebrae: No acute fracture. Redemonstrated type 2 dens fracture and mildly displaced bilateral posterior arch fractures of C1. The dens fracture appears slightly more distracted and angulated posteriorly. No evidence of spinal canal narrowing. No acute fracture. Soft  tissues and spinal canal: No prevertebral fluid or swelling. No visible canal hematoma. Disc levels: Advanced multilevel spondylosis, disc space height loss, degenerative endplate changes greatest at C5-C6 and C6-C7. Multilevel advanced facet arthropathy. Posterior disc osteophyte complexes C5-C6 and C6-C7 cause mild-moderate effacement of the ventral thecal sac. Uncovertebral spurring and facet arthropathy cause advanced multilevel neural foraminal narrowing. Upper chest: No acute abnormality. Other: None. IMPRESSION: 1. No acute intracranial abnormality. 2. Large scalp hematoma about the right forehead. No calvarial fracture. 3. No acute fracture in the cervical spine. Redemonstrated type 2 dens fracture and bilateral posterior arch of C1 fractures. Electronically Signed   By: Minerva Fester M.D.   On: 06/08/2022 02:06    PROCEDURES and INTERVENTIONS:  .1-3 Lead EKG Interpretation  Performed by: Delton Prairie, MD Authorized by: Delton Prairie, MD     Interpretation: normal     ECG rate:  80   ECG rate assessment: normal     Rhythm: sinus rhythm     Ectopy: none     Conduction: normal   .Critical Care  Performed by: Delton Prairie, MD Authorized by: Delton Prairie, MD   Critical care provider statement:    Critical care time (minutes):  30   Critical care time was exclusive of:  Separately billable procedures and treating other patients   Critical care was necessary to treat or prevent imminent or life-threatening deterioration of the following conditions:  Sepsis   Critical care was time spent personally by me on the following activities:  Development of treatment plan with patient or surrogate, discussions with consultants, evaluation of patient's response to treatment, examination of patient, ordering and review of laboratory studies, ordering and review of radiographic studies, ordering and performing treatments and interventions, pulse oximetry, re-evaluation of patient's condition and review  of old charts   Medications  cefTRIAXone (ROCEPHIN) 1 g in sodium chloride 0.9 % 100 mL IVPB (1 g Intravenous New Bag/Given 06/08/22 0601)  acetaminophen (TYLENOL) tablet 1,000 mg (1,000 mg Oral Given 06/08/22 0315)  cloNIDine (CATAPRES) tablet 0.1 mg (0.1 mg Oral Given 06/08/22 0349)  lactated ringers bolus 1,000 mL (0 mLs Intravenous Stopped 06/08/22 0456)     IMPRESSION / MDM / ASSESSMENT AND PLAN / ED COURSE  I reviewed the triage vital signs and the nursing notes.  Differential diagnosis includes, but is not limited to, ICH, skull fracture, sepsis  {Patient presents with symptoms of an acute illness or injury that is potentially life-threatening.  Patient presents after a fall with evidence of sepsis from a UTI alongside a large frontal hematoma.  No signs of neurologic deficits and no other signs of injury beyond the large forehead hematoma.    She does have an indwelling Foley catheter draining cloudy urine so we replaced this and urinalysis is from the fresh Foley catheter.  This shows signs of complicated UTI and she is meeting sepsis criteria with her tachypnea and leukocytosis.  Will draw cultures and start her on ceftriaxone.  Will consult medicine  for admission  Clinical Course as of 06/08/22 0614  Tue Jun 08, 2022  [id number] Reassessed.  Husband is now at the bedside.  We discussed workup so far, reassuring imaging and plan of care.  Discussed possible etiologies of her fall. [DS]  0557 Reassessed.  Husband no longer at the bedside.  I tried to call him but go straight to voicemail.  She remains clinically similar and pleasantly disoriented [DS]    Clinical Course User Index [DS] Delton Prairie, MD     FINAL CLINICAL IMPRESSION(S) / ED DIAGNOSES   Final diagnoses:  Complicated UTI (urinary tract infection)  Injury of head, initial encounter     Rx / DC Orders   ED Discharge Orders     None        Note:  This document was prepared using Dragon voice recognition  software and may include unintentional dictation errors.   Delton Prairie, MD 06/08/22 [phone]

## 2022-06-08 NOTE — ED Triage Notes (Signed)
Pt presents to ER via ems from [facility] after unwitnessed fall.  Pt states she fel;l forward, and hit the front of her head.  Pt is A&Ox3 on arrival.  No blood thinners per paperwork at facility.  Pt began vomiting after arrival to triage.  Large hematoma noted above right eyebrow.

## 2022-06-08 NOTE — ED Notes (Signed)
Dried blood cleaned from face with saline moistened gauze. Abrasion over R eyebrow covered with xeroform dressing. Pt tolerated procedure well. Dr. Clyde Lundborg to bedside at this time for assessment. Pt with repetitive questioning.

## 2022-06-08 NOTE — Evaluation (Signed)
Physical Therapy Evaluation Patient Details Name: Sherry [name] MRN: [id number] DOB: [birth year] Today's Date: 06/08/2022  History of Present Illness  Sherry [name] is a 89 y.o. female with medical history significant of indwelling Foley cath (initially placed in the setting of urinary retention secondary to CVA), hypertension, hyperlipidemia, diastolic CHF, stroke, anxiety, duodenitis, peptic ulcer disease, breast cancer, C1 cervical fracture, who presents with fall.   Clinical Impression  Pt admitted with above diagnosis. Pt currently with functional limitations due to the deficits listed below (see PT Problem List). Pt received supine in bed, agreeable to some participation with PT eval. Unsure of pt's baseline cognition but pt presenting with short term memory deficits unaware that she fell, unaware of her current level of function, or where she was living PTA. Pt denies light or noise sensitivity. She is alert and oriented to self and location. Reliant on EMR for PLOF and current living environment. Per EMR pt has been at [facility] for rehab.   To date pt relies on minA for supine to sit at torso. She is able to adequately scoot her hips anteriorly to allow feet to reach floor. Weakness noticed against gravity on LLE > RLE as pt has reduced AROM with hip flexion and knee extension against gravity. Pt does have recent history of L sided cerebellar CVA. Pt stands minA HHA+1 and able to take R lateral side steps to [facility]. Pt with limited hip/knee extension in standing with labored side stepping primarily with RLE due to limited stance time on her LLE due to weakness. Pt very anxious with increased WOB and anxiety noted needing encouragement for safe completion and minA at torso from PT throughout due to posterior weight shifting on heels throughout. Pt denies dizziness during transfers and standing presenting with normal saccades and smoother pursuits bilaterally. Will need to f/u with  spouse/family on baseline cognition for further screening on related head trauma such as possible concussion. Pt returns to supine with minA at LE's and maxA to scoot upright in bed. Unsure of pt's recent PLOF PTA, however due to significant history of falls and difficulty with standing and side steps, anticipate pt would greatly benefit from f/u PT services to address strength, balance, and gait deficits to reduce risk of falls and optimize independence with ADL's.      Recommendations for follow up therapy are one component of a multi-disciplinary discharge planning process, led by the attending physician.  Recommendations may be updated based on patient status, additional functional criteria and insurance authorization.  Follow Up Recommendations Can patient physically be transported by private vehicle: No     Assistance Recommended at Discharge Frequent or constant Supervision/Assistance  Patient can return home with the following  A lot of help with walking and/or transfers;A lot of help with bathing/dressing/bathroom;Assist for transportation;Help with stairs or ramp for entrance;Assistance with cooking/housework    Equipment Recommendations None recommended by PT  Recommendations for Other Services       Functional Status Assessment Patient has had a recent decline in their functional status and demonstrates the ability to make significant improvements in function in a reasonable and predictable amount of time.     Precautions / Restrictions Precautions Precautions: Fall Restrictions Weight Bearing Restrictions: No      Mobility  Bed Mobility Overal bed mobility: Needs Assistance Bed Mobility: Supine to Sit, Sit to Supine     Supine to sit: Min assist, HOB elevated Sit to supine: Min assist     Patient  Response: Anxious, Cooperative  Transfers Overall transfer level: Needs assistance Equipment used: 1 person hand held assist Transfers: Sit to/from Stand Sit to  Stand: Min assist                Ambulation/Gait Ambulation/Gait assistance: Min assist Gait Distance (Feet): 2 Feet Assistive device: 1 person hand held assist Gait Pattern/deviations: Step-to pattern       General Gait Details: R lateral side steps at EOB towards [facility]  Stairs            Wheelchair Mobility    Modified Rankin (Stroke Patients Only)       Balance Overall balance assessment: Needs assistance Sitting-balance support: Feet supported, Bilateral upper extremity supported Sitting balance-Leahy Scale: Fair       Standing balance-Leahy Scale: Poor Standing balance comment: LImited hip/knee extension in standing with posterior weight shift onto B heels.                             Pertinent Vitals/Pain Pain Assessment Pain Assessment: Faces Faces Pain Scale: Hurts a little bit Pain Location: headache Pain Descriptors / Indicators: Aching, Discomfort Pain Intervention(s): Limited activity within patient's tolerance, Monitored during session, Repositioned    Home Living Family/patient expects to be discharged to:: Skilled nursing facility Living Arrangements: Spouse/significant other   Type of Home: House Home Access: Level entry       Home Layout: One level Home Equipment: Agricultural consultant (2 wheels);Shower seat;Grab bars - tub/shower Additional Comments: Reliant on previous admissions for home lay out above    Prior Function Prior Level of Function : History of Falls (last six months)             Mobility Comments: As of February and MArch pt appears to be mod-I with RW at home, but has a history of many falls.       Hand Dominance        Extremity/Trunk Assessment   Upper Extremity Assessment Upper Extremity Assessment: Defer to OT evaluation    Lower Extremity Assessment Lower Extremity Assessment: Generalized weakness       Communication   Communication: No difficulties  Cognition Arousal/Alertness:  Awake/alert Behavior During Therapy: WFL for tasks assessed/performed Overall Cognitive Status: No family/caregiver present to determine baseline cognitive functioning                                 General Comments: Unsure of pt's baseline cognition. She is alert and oriented to self and location. Unaware of fall leading up to admission. Has short term memory loss as she did not recall also she was at [facility] prior to admission. Unsure of her level of function prior to fall. She is able to accurately report spouse's name.        General Comments      Exercises Other Exercises Other Exercises: Role of PT in acute setting. Saccades and smooth pursuits normal.   Assessment/Plan    PT Assessment Patient needs continued PT services  PT Problem List Decreased strength;Decreased activity tolerance;Decreased balance;Decreased mobility;Decreased safety awareness;Decreased cognition       PT Treatment Interventions DME instruction;Therapeutic exercise;Gait training;Balance training;Neuromuscular re-education;Functional mobility training;Therapeutic activities;Patient/family education    PT Goals (Current goals can be found in the Care Plan section)  Acute Rehab PT Goals Patient Stated Goal: Improve headache PT Goal Formulation: With patient Time For Goal Achievement: 06/22/22 Potential  to Achieve Goals: Good    Frequency Min 2X/week     Co-evaluation               AM-PAC PT "6 Clicks" Mobility  Outcome Measure Help needed turning from your back to your side while in a flat bed without using bedrails?: A Little Help needed moving from lying on your back to sitting on the side of a flat bed without using bedrails?: A Little Help needed moving to and from a bed to a chair (including a wheelchair)?: A Lot Help needed standing up from a chair using your arms (e.g., wheelchair or bedside chair)?: A Lot Help needed to walk in hospital room?: Total Help needed climbing  3-5 steps with a railing? : Total 6 Click Score: 12    End of Session Equipment Utilized During Treatment: Gait belt Activity Tolerance: Other (comment) (fear of falling) Patient left: in bed;with call bell/phone within reach;with bed alarm set Nurse Communication: Mobility status PT Visit Diagnosis: Muscle weakness (generalized) (M62.81);Difficulty in walking, not elsewhere classified (R26.2)    Time: [id number] PT Time Calculation (min) (ACUTE ONLY): 15 min   Charges:   PT Evaluation $PT Eval Low Complexity: 1 Low          Kenadee Gates M. Fairly IV, PT, DPT Physical Therapist- [facility]  06/08/2022, 3:55 PM

## 2022-06-08 NOTE — ED Notes (Signed)
Pt sitting up in bed eating meal tray

## 2022-06-08 NOTE — TOC Initial Note (Signed)
Transition of Care [facility]) - Initial/Assessment Note    Patient Details  Name: Sherry [name] MRN: [id number] Date of Birth: [birth year]  Transition of Care [facility]) CM/SW Contact:    Chapman Fitch, RN Phone Number: 06/08/2022, 2:57 PM  Clinical Narrative:                    New admit from ED Patient admitted from [facility] Confirmed with Sue Lush at [facility] that patient is still there for STR Sue Lush to check with business office to confirm patient will not need 3 night stay.  Patient has traditional medicare and currently in obs    PT eval pending   Patient Goals and CMS Choice            Expected Discharge Plan and Services                                              Prior Living Arrangements/Services                       Activities of Daily Living      Permission Sought/Granted                  Emotional Assessment              Admission diagnosis:  Acute lower UTI [N39.0] Complicated UTI (urinary tract infection) [N39.0] Injury of head, initial encounter [S09.90XA] Sepsis due to gram-negative UTI [A41.50, N39.0] Patient Active Problem List   Diagnosis Date Noted   Acute lower UTI 06/08/2022   Sepsis due to gram-negative UTI 06/08/2022   Fall 06/08/2022   Protein-calorie malnutrition, moderate 06/08/2022   Chronic diastolic CHF (congestive heart failure) 06/08/2022   H/O ischemic vertebrobasilar artery cerebellar stroke 05/25/2022   Complicated UTI (urinary tract infection) 05/15/2022   Nausea and vomiting 05/15/2022   Constipation 05/15/2022   Antiplatelet or antithrombotic long-term use 05/04/2022   Peptic ulcer 05/03/2022   Upper GI bleed 05/02/2022   Anemia, posthemorrhagic, acute 05/02/2022   Acute urinary retention 05/02/2022   Vertigo following cerebrovascular accident 05/02/2022   Leukocytosis 05/02/2022   Lactic acidosis 05/02/2022   Hypertensive emergency 04/26/2022   Cerebellar stroke 04/26/2022    Left-sided cerebrovascular accident (CVA) 04/25/2022   CVA (cerebral vascular accident) 04/25/2022   UTI (urinary tract infection) 01/05/2022   Acute metabolic encephalopathy 01/04/2022   Encephalopathy 01/04/2022   Hypertensive urgency 01/03/2022   Back pain 05/29/2021   Left hip pain 05/28/2021   Hypokalemia 05/28/2021   Orthostatic hypotension 05/25/2021   Left sided sciatica 05/24/2021   Duodenitis 05/18/2021   Anemia of chronic disease 05/16/2021   Abdominal pain 05/15/2021   Thrombocytosis 05/15/2021   Essential hypertension 04/30/2021   Spinal stenosis at L4-L5 level 04/27/2021   Intractable low back pain 04/27/2021   Low back pain 04/26/2021   Closed C1 fracture 02/12/2020   Neck fracture 02/11/2020   Chest pain with moderate risk for cardiac etiology 05/05/2017   Shortness of breath 05/05/2017   Hyperlipidemia 09/25/2016   Neuropathy 09/25/2016   Statin intolerance 09/25/2016   Anxiety 09/25/2016   Palpitations 09/25/2016   PCP:  Danella Penton, MD Pharmacy:   [facility] - [place], [place] - [address] [place] [address] Phone: [phone] Fax: [phone]  [facility] -  [place], [place] - [address] [place] [place] [address] Phone: [phone] Fax: [phone]  [facility] [address] [place] [address] Phone: [phone] Fax: [phone]  [id number] [place], [place] - [address] [place] [address] Phone: [phone] Fax: [phone]  [facility] [place], [place] - [address] [place] [address] Phone: [phone] Fax: [phone]     Social Determinants of Health (SDOH) Social History: SDOH Screenings   Food Insecurity: No Food Insecurity (05/15/2022)  Housing: Low Risk  (05/15/2022)  Transportation Needs: No Transportation  Needs (05/15/2022)  Utilities: Not At Risk (05/15/2022)  Tobacco Use: Low Risk  (06/08/2022)   SDOH Interventions:     Readmission Risk Interventions    01/07/2022    2:50 PM  Readmission Risk Prevention Plan  Post Dischage Appt Complete  Medication Screening Complete  Transportation Screening Complete

## 2022-06-08 NOTE — Evaluation (Signed)
Occupational Therapy Evaluation Patient Details Name: Sherry [name] MRN: [id number] DOB: 07-Nov-1930 Today's Date: 06/08/2022   History of Present Illness Sherry [name] is a 89 y.o. female with medical history significant of indwelling Foley cath (initially placed in the setting of urinary retention secondary to CVA), hypertension, hyperlipidemia, diastolic CHF, stroke, anxiety, duodenitis, peptic ulcer disease, breast cancer, C1 cervical fracture, who presents with fall.   Clinical Impression   Patient presenting with decreased Ind in self care, balance, functional mobility/transfers, endurance, and safety awareness.  Patient is very confused and oriented to self only during assessment. Pt reports living at home with spouse independently at baseline. She has been at [facility] since march from recent CVA . Pt needing min A for bed mobility but with significant dizziness. BP supine is 173/77 and once seated 145/80. Pt declined attempts to stand secondary to significant BP drop and feeling unwell. RN remained in room with pt.  Patient will benefit from acute OT to increase overall independence in the areas of ADLs, functional mobility, and safety awareness in order to safely discharge.     Recommendations for follow up therapy are one component of a multi-disciplinary discharge planning process, led by the attending physician.  Recommendations may be updated based on patient status, additional functional criteria and insurance authorization.   Assistance Recommended at Discharge Frequent or constant Supervision/Assistance  Patient can return home with the following A lot of help with bathing/dressing/bathroom;Assistance with cooking/housework;Direct supervision/assist for medications management;Direct supervision/assist for financial management;Assist for transportation;Help with stairs or ramp for entrance;A lot of help with walking and/or transfers    Functional Status Assessment   Patient has had a recent decline in their functional status and demonstrates the ability to make significant improvements in function in a reasonable and predictable amount of time.  Equipment Recommendations  Other (comment) (defer)       Precautions / Restrictions Precautions Precautions: Fall Precaution Comments: dizziness with positional changes Restrictions Weight Bearing Restrictions: No      Mobility Bed Mobility Overal bed mobility: Needs Assistance Bed Mobility: Supine to Sit, Sit to Supine     Supine to sit: Min assist Sit to supine: Min assist        Transfers                   General transfer comment: Deferred secondary to orthostatic      Balance Overall balance assessment: Needs assistance Sitting-balance support: Feet supported, Bilateral upper extremity supported Sitting balance-Leahy Scale: Fair                                     ADL either performed or assessed with clinical judgement      Vision Baseline Vision/History: 0 No visual deficits              Pertinent Vitals/Pain Pain Assessment Pain Assessment: Faces Faces Pain Scale: Hurts a little bit Pain Location: headache Pain Descriptors / Indicators: Aching, Discomfort Pain Intervention(s): Monitored during session, Premedicated before session, Repositioned     Hand Dominance Right   Extremity/Trunk Assessment Upper Extremity Assessment Upper Extremity Assessment: Generalized weakness   Lower Extremity Assessment Lower Extremity Assessment: Generalized weakness       Communication Communication Communication: No difficulties   Cognition Arousal/Alertness: Awake/alert Behavior During Therapy: WFL for tasks assessed/performed Overall Cognitive Status: No family/caregiver present to determine baseline cognitive functioning  General Comments: Unsure of pt's baseline cogntion. She is oriented to self only.  She asked multiple times where she was and did I fall?                Home Living Family/patient expects to be discharged to:: Skilled nursing facility Living Arrangements: Spouse/significant other Available Help at Discharge: Family;Available PRN/intermittently Type of Home: House Home Access: Level entry     Home Layout: One level     Bathroom Shower/Tub: Producer, television/film/video: Handicapped height Bathroom Accessibility: Yes   Home Equipment: Agricultural consultant (2 wheels);Shower seat;Grab bars - tub/shower   Additional Comments: Reliant on previous admissions for home lay out above  Lives With: Spouse    Prior Functioning/Environment Prior Level of Function : History of Falls (last six months)             Mobility Comments: As of February and MArch pt appears to be mod-I with RW at home, but has a history of many falls. ADLs Comments: Independent with ADLs/IADLs, still driving        OT Problem List: Decreased strength;Impaired balance (sitting and/or standing);Decreased activity tolerance;Decreased safety awareness;Decreased knowledge of use of DME or AE;Pain;Decreased coordination;Decreased range of motion      OT Treatment/Interventions: Self-care/ADL training;Therapeutic activities;Balance training;Patient/family education;Energy conservation;Cognitive remediation/compensation;Therapeutic exercise    OT Goals(Current goals can be found in the care plan section) Acute Rehab OT Goals Patient Stated Goal: to return to PLOF OT Goal Formulation: With patient Time For Goal Achievement: 06/22/22 Potential to Achieve Goals: Fair ADL Goals Pt Will Perform Grooming: with supervision;standing Pt Will Perform Lower Body Dressing: with supervision;sit to/from stand Pt Will Transfer to Toilet: with supervision;ambulating Pt Will Perform Toileting - Clothing Manipulation and hygiene: with supervision;sit to/from stand  OT Frequency: Min 1X/week       AM-PAC  OT "6 Clicks" Daily Activity     Outcome Measure Help from another person eating meals?: A Little Help from another person taking care of personal grooming?: A Little Help from another person toileting, which includes using toliet, bedpan, or urinal?: A Lot Help from another person bathing (including washing, rinsing, drying)?: A Lot Help from another person to put on and taking off regular upper body clothing?: A Little Help from another person to put on and taking off regular lower body clothing?: A Lot 6 Click Score: 15   End of Session Nurse Communication: Mobility status  Activity Tolerance: Other (comment) (limited secondary to orthostatics) Patient left: in chair;with call bell/phone within reach;with chair alarm set  OT Visit Diagnosis: Other abnormalities of gait and mobility (R26.89);Muscle weakness (generalized) (M62.81);Dizziness and giddiness (R42);Other symptoms and signs involving the nervous system (R29.898)                Time: 8299-3716 OT Time Calculation (min): 13 min Charges:  OT General Charges $OT Visit: 1 Visit OT Evaluation $OT Eval Moderate Complexity: 1 [address] [place], OTR/L , CBIS ascom [phone]  06/08/22, 4:52 PM

## 2022-06-09 DIAGNOSIS — W19XXXA Unspecified fall, initial encounter: Secondary | ICD-10-CM | POA: Diagnosis present

## 2022-06-09 DIAGNOSIS — I5032 Chronic diastolic (congestive) heart failure: Secondary | ICD-10-CM | POA: Diagnosis present

## 2022-06-09 DIAGNOSIS — H5789 Other specified disorders of eye and adnexa: Secondary | ICD-10-CM | POA: Diagnosis present

## 2022-06-09 DIAGNOSIS — Z853 Personal history of malignant neoplasm of breast: Secondary | ICD-10-CM | POA: Diagnosis not present

## 2022-06-09 DIAGNOSIS — I6502 Occlusion and stenosis of left vertebral artery: Secondary | ICD-10-CM | POA: Diagnosis not present

## 2022-06-09 DIAGNOSIS — I69354 Hemiplegia and hemiparesis following cerebral infarction affecting left non-dominant side: Secondary | ICD-10-CM | POA: Diagnosis not present

## 2022-06-09 DIAGNOSIS — I452 Bifascicular block: Secondary | ICD-10-CM | POA: Diagnosis present

## 2022-06-09 DIAGNOSIS — B9620 Unspecified Escherichia coli [E. coli] as the cause of diseases classified elsewhere: Secondary | ICD-10-CM | POA: Diagnosis present

## 2022-06-09 DIAGNOSIS — I69393 Ataxia following cerebral infarction: Secondary | ICD-10-CM | POA: Diagnosis not present

## 2022-06-09 DIAGNOSIS — T83511A Infection and inflammatory reaction due to indwelling urethral catheter, initial encounter: Secondary | ICD-10-CM | POA: Diagnosis present

## 2022-06-09 DIAGNOSIS — Y92129 Unspecified place in nursing home as the place of occurrence of the external cause: Secondary | ICD-10-CM | POA: Diagnosis not present

## 2022-06-09 DIAGNOSIS — Z79899 Other long term (current) drug therapy: Secondary | ICD-10-CM | POA: Diagnosis not present

## 2022-06-09 DIAGNOSIS — E785 Hyperlipidemia, unspecified: Secondary | ICD-10-CM | POA: Diagnosis present

## 2022-06-09 DIAGNOSIS — E440 Moderate protein-calorie malnutrition: Secondary | ICD-10-CM | POA: Diagnosis present

## 2022-06-09 DIAGNOSIS — Z8249 Family history of ischemic heart disease and other diseases of the circulatory system: Secondary | ICD-10-CM | POA: Diagnosis not present

## 2022-06-09 DIAGNOSIS — R296 Repeated falls: Secondary | ICD-10-CM | POA: Diagnosis present

## 2022-06-09 DIAGNOSIS — N319 Neuromuscular dysfunction of bladder, unspecified: Secondary | ICD-10-CM | POA: Diagnosis present

## 2022-06-09 DIAGNOSIS — Y846 Urinary catheterization as the cause of abnormal reaction of the patient, or of later complication, without mention of misadventure at the time of the procedure: Secondary | ICD-10-CM | POA: Diagnosis present

## 2022-06-09 DIAGNOSIS — I110 Hypertensive heart disease with heart failure: Secondary | ICD-10-CM | POA: Diagnosis present

## 2022-06-09 DIAGNOSIS — Z7982 Long term (current) use of aspirin: Secondary | ICD-10-CM | POA: Diagnosis not present

## 2022-06-09 DIAGNOSIS — Z9181 History of falling: Secondary | ICD-10-CM | POA: Diagnosis not present

## 2022-06-09 DIAGNOSIS — N390 Urinary tract infection, site not specified: Secondary | ICD-10-CM | POA: Diagnosis not present

## 2022-06-09 DIAGNOSIS — I160 Hypertensive urgency: Secondary | ICD-10-CM | POA: Diagnosis present

## 2022-06-09 DIAGNOSIS — G959 Disease of spinal cord, unspecified: Secondary | ICD-10-CM | POA: Diagnosis present

## 2022-06-09 DIAGNOSIS — K279 Peptic ulcer, site unspecified, unspecified as acute or chronic, without hemorrhage or perforation: Secondary | ICD-10-CM | POA: Diagnosis present

## 2022-06-09 DIAGNOSIS — S0083XA Contusion of other part of head, initial encounter: Secondary | ICD-10-CM | POA: Diagnosis present

## 2022-06-09 DIAGNOSIS — Z66 Do not resuscitate: Secondary | ICD-10-CM | POA: Diagnosis present

## 2022-06-09 DIAGNOSIS — L89151 Pressure ulcer of sacral region, stage 1: Secondary | ICD-10-CM | POA: Diagnosis present

## 2022-06-09 LAB — URINE CULTURE

## 2022-06-09 LAB — BLOOD CULTURE ID PANEL (REFLEXED) - BCID2

## 2022-06-09 LAB — CULTURE, BLOOD (ROUTINE X 2): Culture: NO GROWTH

## 2022-06-09 LAB — LACTIC ACID, PLASMA: Lactic Acid, Venous: 1.8 mmol/L (ref 0.5–1.9)

## 2022-06-09 LAB — CBC
HCT: 29.8 % — ABNORMAL LOW (ref 36.0–46.0)
Hemoglobin: 9.5 g/dL — ABNORMAL LOW (ref 12.0–15.0)
MCH: 28.8 pg (ref 26.0–34.0)
MCHC: 31.9 g/dL (ref 30.0–36.0)
MCV: 90.3 fL (ref 80.0–100.0)
Platelets: 314 10*3/uL (ref 150–400)
RBC: 3.3 MIL/uL — ABNORMAL LOW (ref 3.87–5.11)
RDW: 14 % (ref 11.5–15.5)
WBC: 7.1 10*3/uL (ref 4.0–10.5)
nRBC: 0 % (ref 0.0–0.2)

## 2022-06-09 MED ORDER — CHLORHEXIDINE GLUCONATE CLOTH 2 % EX PADS
6.0000 | MEDICATED_PAD | Freq: Every day | CUTANEOUS | Status: DC
  Administered 2022-06-09 – 2022-06-11 (×3): 6 via TOPICAL

## 2022-06-09 MED ORDER — LOSARTAN POTASSIUM 50 MG PO TABS
50.0000 mg | ORAL_TABLET | Freq: Every day | ORAL | Status: DC
  Administered 2022-06-09 – 2022-06-11 (×3): 50 mg via ORAL
  Filled 2022-06-09 (×3): qty 1

## 2022-06-09 NOTE — Progress Notes (Signed)
PHARMACY - PHYSICIAN COMMUNICATION CRITICAL VALUE ALERT - BLOOD CULTURE IDENTIFICATION (BCID)  Sherry [name] is an 89 y.o. female who presented to [facility] on 06/08/2022 with a chief complaint of UIT  Assessment:  1 of 4 bottles (aerobic) Staph epi, MecA/C DETECTED   Name of physician (or Provider) Contacted: Dr. Joylene Igo  Current antibiotics: Ceftriaxone  Changes to prescribed antibiotics recommended:  Recommended adding vancomycin if clinical assessment favored against contaminated sample.  Dr Joylene Igo feels it likely a contaminant.  Results for orders placed or performed during the hospital encounter of 06/08/22  Blood Culture ID Panel (Reflexed) (Collected: 06/08/2022  6:54 AM)  Result Value Ref Range   Enterococcus faecalis NOT DETECTED NOT DETECTED   Enterococcus Faecium NOT DETECTED NOT DETECTED   Listeria monocytogenes NOT DETECTED NOT DETECTED   Staphylococcus species DETECTED (A) NOT DETECTED   Staphylococcus aureus (BCID) NOT DETECTED NOT DETECTED   Staphylococcus epidermidis DETECTED (A) NOT DETECTED   Staphylococcus lugdunensis NOT DETECTED NOT DETECTED   Streptococcus species NOT DETECTED NOT DETECTED   Streptococcus agalactiae NOT DETECTED NOT DETECTED   Streptococcus pneumoniae NOT DETECTED NOT DETECTED   Streptococcus pyogenes NOT DETECTED NOT DETECTED   A.calcoaceticus-baumannii NOT DETECTED NOT DETECTED   Bacteroides fragilis NOT DETECTED NOT DETECTED   Enterobacterales NOT DETECTED NOT DETECTED   Enterobacter cloacae complex NOT DETECTED NOT DETECTED   Escherichia coli NOT DETECTED NOT DETECTED   Klebsiella aerogenes NOT DETECTED NOT DETECTED   Klebsiella oxytoca NOT DETECTED NOT DETECTED   Klebsiella pneumoniae NOT DETECTED NOT DETECTED   Proteus species NOT DETECTED NOT DETECTED   Salmonella species NOT DETECTED NOT DETECTED   Serratia marcescens NOT DETECTED NOT DETECTED   Haemophilus influenzae NOT DETECTED NOT DETECTED   Neisseria meningitidis NOT  DETECTED NOT DETECTED   Pseudomonas aeruginosa NOT DETECTED NOT DETECTED   Stenotrophomonas maltophilia NOT DETECTED NOT DETECTED   Candida albicans NOT DETECTED NOT DETECTED   Candida auris NOT DETECTED NOT DETECTED   Candida glabrata NOT DETECTED NOT DETECTED   Candida krusei NOT DETECTED NOT DETECTED   Candida parapsilosis NOT DETECTED NOT DETECTED   Candida tropicalis NOT DETECTED NOT DETECTED   Cryptococcus neoformans/gattii NOT DETECTED NOT DETECTED   Methicillin resistance mecA/C DETECTED (A) NOT DETECTED    Manfred Shirts 06/09/2022  4:48 PM

## 2022-06-09 NOTE — TOC Initial Note (Signed)
Transition of Care [facility]) - Initial/Assessment Note    Patient Details  Name: Sherry [name] MRN: [id number] Date of Birth: August 16, 1930  Transition [facility]) CM/SW Contact:    Chapman Fitch, RN Phone Number: 06/09/2022, 2:04 PM  Clinical Narrative:                  Patient confirms she wishes to return to [facility] [employer] Confirmed with Sue Lush at [facility] that patient does not require a 3 night stay  Fl2 sent for signature         Patient Goals and CMS Choice            Expected Discharge Plan and Services                                              Prior Living Arrangements/Services                       Activities of Daily Living   ADL Screening (condition at time of admission) Patient's cognitive ability adequate to safely complete daily activities?: No Is the patient deaf or have difficulty hearing?: Yes Does the patient have difficulty seeing, even when wearing glasses/contacts?: No Does the patient have difficulty concentrating, remembering, or making decisions?: Yes Patient able to express need for assistance with ADLs?: Yes Does the patient have difficulty dressing or bathing?: Yes Independently performs ADLs?: No Does the patient have difficulty walking or climbing stairs?: Yes Weakness of Legs: Both Weakness of Arms/Hands: None  Permission Sought/Granted                  Emotional Assessment              Admission diagnosis:  Acute lower UTI [N39.0] Complicated UTI (urinary tract infection) [N39.0] Injury of head, initial encounter [S09.90XA] Sepsis due to gram-negative UTI [A41.50, N39.0] Patient Active Problem List   Diagnosis Date Noted   Acute lower UTI 06/08/2022   Sepsis due to gram-negative UTI 06/08/2022   Fall 06/08/2022   Protein-calorie malnutrition, moderate 06/08/2022   Chronic diastolic CHF (congestive heart failure) 06/08/2022   H/O ischemic vertebrobasilar artery cerebellar stroke  05/25/2022   Complicated UTI (urinary tract infection) 05/15/2022   Nausea and vomiting 05/15/2022   Constipation 05/15/2022   Antiplatelet or antithrombotic long-term use 05/04/2022   Peptic ulcer 05/03/2022   Upper GI bleed 05/02/2022   Anemia, posthemorrhagic, acute 05/02/2022   Acute urinary retention 05/02/2022   Vertigo following cerebrovascular accident 05/02/2022   Leukocytosis 05/02/2022   Lactic acidosis 05/02/2022   Hypertensive emergency 04/26/2022   Cerebellar stroke 04/26/2022   Left-sided cerebrovascular accident (CVA) 04/25/2022   CVA (cerebral vascular accident) 04/25/2022   UTI (urinary tract infection) 01/05/2022   Acute metabolic encephalopathy 01/04/2022   Encephalopathy 01/04/2022   Hypertensive urgency 01/03/2022   Back pain 05/29/2021   Left hip pain 05/28/2021   Hypokalemia 05/28/2021   Orthostatic hypotension 05/25/2021   Left sided sciatica 05/24/2021   Duodenitis 05/18/2021   Anemia of chronic disease 05/16/2021   Abdominal pain 05/15/2021   Thrombocytosis 05/15/2021   Essential hypertension 04/30/2021   Spinal stenosis at L4-L5 level 04/27/2021   Intractable low back pain 04/27/2021   Low back pain 04/26/2021   Closed C1 fracture 02/12/2020   Neck fracture 02/11/2020   Chest pain with moderate  risk for cardiac etiology 05/05/2017   Shortness of breath 05/05/2017   Hyperlipidemia 09/25/2016   Neuropathy 09/25/2016   Statin intolerance 09/25/2016   Anxiety 09/25/2016   Palpitations 09/25/2016   PCP:  Danella Penton, MD Pharmacy:   [facility] [place] - [place], [place] - [address] [place] [address] Phone: [phone] Fax: [phone]  [facility] - [place], [place] - [address] [place] [place] [address] Phone: [phone] Fax: [phone]  [facility] [address] [place] [address] Phone: [phone] Fax:  [phone]  [id number] [place], [place] - [address] [place] [address] Phone: [phone] Fax: [phone]  [facility] - [place], [place] - [address] [place] [address] Phone: [phone] Fax: [phone]     Social Determinants of Health (SDOH) Social History: SDOH Screenings   Food Insecurity: No Food Insecurity (05/15/2022)  Housing: Low Risk  (05/15/2022)  Transportation Needs: No Transportation Needs (05/15/2022)  Utilities: Not At Risk (05/15/2022)  Tobacco Use: Low Risk  (06/08/2022)   SDOH Interventions:     Readmission Risk Interventions    01/07/2022    2:50 PM  Readmission Risk Prevention Plan  Post Dischage Appt Complete  Medication Screening Complete  Transportation Screening Complete

## 2022-06-09 NOTE — Progress Notes (Signed)
Progress Note   Patient: Sherry [name] CBJ:[id number] DOB: 1930/10/07 DOA: 06/08/2022     1 DOS: the patient was seen and examined on 06/09/2022   Brief hospital course:  Sherry [name] is a 89 y.o. female with medical history significant of indwelling Foley cath (initially placed in the setting of urinary retention secondary to CVA), hypertension, hyperlipidemia, diastolic CHF, stroke, anxiety, duodenitis, peptic ulcer disease, breast cancer, C1 cervical fracture, who presents with fall.   Patient was recently hospitalized from 3/16 - 3/22 due to complicated UTI.  Urine culture was positive for E. coli which was sensitive for Rocephin. Per report, pt had an unwitnessed fall in SNF. Pt states she fell forward, and hit the front of her head. She developed hematoma in the right forehead.  She has pain in the left foot and left ankle, which seem to be moderate. Patient does not have chest pain, cough, shortness breath.  No fever or chills.  Patient has nausea and vomited once, currently no nausea vomiting, diarrhea or abdominal pain.  Patient denies symptoms of UTI. She has an indwelling Foley catheter draining cloudy urine. Foley cath was replaced and urinalysis is done from the fresh Foley catheter in ED.   Assessment and Plan:  Complicated UTI (urinary tract infection):  Indwelling Foley catheter has been replaced.   Patient had elevated lactic acid 3.2, but no fever, WBC is 12.0, clinically does not seem to have sepsis.  Lactate has normalized Repeat urine culture yields 40,000 CFU of E. Coli Continue IV Rocephin to complete a 3 to 5-day course of therapy    Fall with large hematoma over the right eye Continue fall precautions Patient noted to have a drop in her H&H (12 >> 9.5) which may be hemodilutional Hold aspirin and Plavix for now -Fall precaution -PT/OT      Hypertensive urgency:  Blood pressure 201/100 --> 151/88 -IV hydralazine as needed -Resume Cozaar 50 mg daily     Chronic diastolic CHF (congestive heart failure):  2D echo on 04/26/22 showed EF of 70-75% with grade 1 diastolic dysfunction.   Continue Cozaar    CVA (cerebral vascular accident) Hold aspirin and Plavix due to hematoma with drop in H&H Continue Lipitor   Peptic ulcer -Protonix   Protein-calorie malnutrition, moderate: Body weight 54.3 kg -Nutrition boost -Nutrition consult           Subjective: Patient is seen and examined at the bedside.  She is oriented to person not to place or time.  Physical Exam: Vitals:   06/08/22 1937 06/09/22 0000 06/09/22 0406 06/09/22 0755  BP: (!) 143/80  (!) 147/68 (!) 180/81  Pulse: 80  72 78  Resp: 16 17 20 16   Temp: 98.4 F (36.9 C)  98 F (36.7 C) 97.8 F (36.6 C)  TempSrc: Oral  Oral Axillary  SpO2: 100%  99% 98%   General: Not in acute distress HEENT:       Eyes: PERRL, EOMI, no scleral icterus, ecchymosis around the right eye       ENT: No discharge from the ears and nose, no pharynx injection, no tonsillar enlargement.        Neck: No JVD, no bruit, no mass felt. Heme: No neck lymph node enlargement. Cardiac: S1/S2, RRR, No murmurs, No gallops or rubs. Respiratory: No rales, wheezing, rhonchi or rubs. GI: Soft, nondistended, nontender, no rebound pain, no organomegaly, BS present. GU: No hematuria Ext: No pitting leg edema bilaterally. 1+DP/PT pulse bilaterally. Musculoskeletal: No joint  deformities, No joint redness or warmth, no limitation of ROM in spin. Has tenderness in left foot and ankle. Skin: has a small skin tear and hematoma in right forehead Neuro: Alert, oriented to  person and place and place but not to time Cranial nerves II-XII grossly intact, moves all extremities.   Psych: Patient is not psychotic, no suicidal or hemocidal ideation.  Data Reviewed: Labs reviewed.  Drop in H&H noted from 12 >> 9.5 There are no new results to review at this time.  Family Communication: No family at the  bedside  Disposition: Status is: Inpatient Remains inpatient appropriate because: Will monitor H&H overnight since patient has a hematoma with associated drop in H&H  Planned Discharge Destination: Skilled nursing facility    Time spent: 33 minutes  Author: Lucile Shutters, MD 06/09/2022 12:22 PM  For on call review [email or web address].

## 2022-06-09 NOTE — [place] FL2 (Signed)
[place] MEDICAID FL2 LEVEL OF CARE FORM     IDENTIFICATION  Patient Name: Sherry [name] Birthdate: 1931-02-07 Sex: female Admission Date (Current Location): 06/08/2022  County and [place] Number:  Chiropodist and Address:         Provider Number: [id number]  Attending Physician Name and Address:  Lucile Shutters, MD  Relative Name and Phone Number:       Current Level of Care: Hospital Recommended Level of Care: Skilled Nursing Facility Prior Approval Number:    Date Approved/Denied:   PASRR Number: [id number]  Discharge Plan: SNF    Current Diagnoses: Patient Active Problem List   Diagnosis Date Noted   Acute lower UTI 06/08/2022   Sepsis due to gram-negative UTI 06/08/2022   Fall 06/08/2022   Protein-calorie malnutrition, moderate 06/08/2022   Chronic diastolic CHF (congestive heart failure) 06/08/2022   H/O ischemic vertebrobasilar artery cerebellar stroke 05/25/2022   Complicated UTI (urinary tract infection) 05/15/2022   Nausea and vomiting 05/15/2022   Constipation 05/15/2022   Antiplatelet or antithrombotic long-term use 05/04/2022   Peptic ulcer 05/03/2022   Upper GI bleed 05/02/2022   Anemia, posthemorrhagic, acute 05/02/2022   Acute urinary retention 05/02/2022   Vertigo following cerebrovascular accident 05/02/2022   Leukocytosis 05/02/2022   Lactic acidosis 05/02/2022   Hypertensive emergency 04/26/2022   Cerebellar stroke 04/26/2022   Left-sided cerebrovascular accident (CVA) 04/25/2022   CVA (cerebral vascular accident) 04/25/2022   UTI (urinary tract infection) 01/05/2022   Acute metabolic encephalopathy 01/04/2022   Encephalopathy 01/04/2022   Hypertensive urgency 01/03/2022   Back pain 05/29/2021   Left hip pain 05/28/2021   Hypokalemia 05/28/2021   Orthostatic hypotension 05/25/2021   Left sided sciatica 05/24/2021   Duodenitis 05/18/2021   Anemia of chronic disease 05/16/2021   Abdominal pain 05/15/2021    Thrombocytosis 05/15/2021   Essential hypertension 04/30/2021   Spinal stenosis at L4-L5 level 04/27/2021   Intractable low back pain 04/27/2021   Low back pain 04/26/2021   Closed C1 fracture 02/12/2020   Neck fracture 02/11/2020   Chest pain with moderate risk for cardiac etiology 05/05/2017   Shortness of breath 05/05/2017   Hyperlipidemia 09/25/2016   Neuropathy 09/25/2016   Statin intolerance 09/25/2016   Anxiety 09/25/2016   Palpitations 09/25/2016    Orientation RESPIRATION BLADDER Height & Weight     Self, Situation, Place  Normal Indwelling catheter Weight:   Height:     BEHAVIORAL SYMPTOMS/MOOD NEUROLOGICAL BOWEL NUTRITION STATUS      Incontinent Diet (Heart Healthy)  AMBULATORY STATUS COMMUNICATION OF NEEDS Skin   Limited Assist Verbally Skin abrasions, Bruising, PU Stage and Appropriate Care                       Personal Care Assistance Level of Assistance              Functional Limitations Info             SPECIAL CARE FACTORS FREQUENCY  PT (By licensed PT), OT (By licensed OT)                    Contractures Contractures Info: Not present    Additional Factors Info  Code Status, Allergies Code Status Info: DNR Allergies Info: Morphine, Buprenorphine Hcl, Codeine, Epinephrine, Morphine And Related, Percocet (Oxycodone-acetaminophen)           Current Medications (06/09/2022):  This is the current hospital active medication list Current Facility-Administered Medications  Medication  Dose Route Frequency Provider Last Rate Last Admin   acetaminophen (TYLENOL) tablet 650 mg  650 mg Oral Q6H PRN Lorretta Harp, MD   650 mg at 06/09/22 1046   atorvastatin (LIPITOR) tablet 40 mg  40 mg Oral Daily Lorretta Harp, MD   40 mg at 06/09/22 0820   baclofen (LIORESAL) tablet 5 mg  5 mg Oral BID PRN Lorretta Harp, MD       cefTRIAXone (ROCEPHIN) 1 g in sodium chloride 0.9 % 100 mL IVPB  1 g Intravenous Q24H Lorretta Harp, MD 200 mL/hr at 06/09/22 0513 1 g at  06/09/22 0513   Chlorhexidine Gluconate Cloth 2 % PADS 6 each  6 each Topical Daily Mansy, Jan A, MD   6 each at 06/09/22 6283   cholecalciferol (VITAMIN D3) 25 MCG (1000 UNIT) tablet 1,000 Units  1,000 Units Oral Daily Lorretta Harp, MD   1,000 Units at 06/09/22 1517   cyanocobalamin (VITAMIN B12) tablet 1,000 mcg  1,000 mcg Oral Daily Lorretta Harp, MD   1,000 mcg at 06/09/22 1046   hydrALAZINE (APRESOLINE) injection 5 mg  5 mg Intravenous Q2H PRN Lorretta Harp, MD       labetalol (NORMODYNE) tablet 100 mg  100 mg Oral Daily Lorretta Harp, MD   100 mg at 06/09/22 0820   lactose free nutrition (Boost) liquid 237 mL  237 mL Oral TID BM Lorretta Harp, MD   237 mL at 06/09/22 1359   loperamide (IMODIUM) capsule 2 mg  2 mg Oral QID PRN Lorretta Harp, MD       losartan (COZAAR) tablet 50 mg  50 mg Oral Daily Agbata, Tochukwu, MD   50 mg at 06/09/22 1358   ondansetron (ZOFRAN) injection 4 mg  4 mg Intravenous Q8H PRN Lorretta Harp, MD       pantoprazole (PROTONIX) EC tablet 40 mg  40 mg Oral Daily Lorretta Harp, MD   40 mg at 06/09/22 6160   polyethylene glycol (MIRALAX / GLYCOLAX) packet 17 g  17 g Oral Q12H PRN Lorretta Harp, MD       tamsulosin [facility]) capsule 0.4 mg  0.4 mg Oral Daily Lorretta Harp, MD   0.4 mg at 06/09/22 0820     Discharge Medications: Please see discharge summary for a list of discharge medications.  Relevant Imaging Results:  Relevant Lab Results:   Additional Information SS#: [id number]  Chapman Fitch, RN

## 2022-06-09 NOTE — Progress Notes (Signed)
Mobility Specialist - Progress Note   06/09/22 1600  Mobility  Activity Transferred to/from [facility]  Level of Assistance Minimal assist, patient does 75% or more  Assistive Device Other (Comment) (HHA)  Distance Ambulated (ft) 2 ft  Activity Response Tolerated well  $Mobility charge 1 Mobility     Pt lying in bed upon arrival, utilizing RA. Requesting assistance to [facility] for BM. Pt completed bed mobility with minA. Dizzy upon sitting. Transferred to/from [facility] with HHA. Unsuccessful BM. Pt voices headache and nausea. Returned supine with alarm set, needs in reach. RN notified.    Filiberto Pinks Mobility Specialist 06/09/22, 4:10 PM

## 2022-06-09 NOTE — Plan of Care (Signed)
  Problem: Education: Goal: Knowledge of General Education information will improve Description: Including pain rating scale, medication(s)/side effects and non-pharmacologic comfort measures Outcome: Progressing   Problem: Clinical Measurements: Goal: Respiratory complications will improve Outcome: Progressing Goal: Cardiovascular complication will be avoided Outcome: Progressing   Problem: Activity: Goal: Risk for activity intolerance will decrease Outcome: Progressing   Problem: Nutrition: Goal: Adequate nutrition will be maintained Outcome: Progressing   Problem: Coping: Goal: Level of anxiety will decrease Outcome: Progressing   Problem: Elimination: Goal: Will not experience complications related to urinary retention Outcome: Progressing   Problem: Pain Managment: Goal: General experience of comfort will improve Outcome: Progressing   Problem: Safety: Goal: Ability to remain free from injury will improve Outcome: Progressing   

## 2022-06-10 ENCOUNTER — Inpatient Hospital Stay: Payer: Medicare Other

## 2022-06-10 DIAGNOSIS — R296 Repeated falls: Secondary | ICD-10-CM | POA: Diagnosis not present

## 2022-06-10 DIAGNOSIS — I6502 Occlusion and stenosis of left vertebral artery: Secondary | ICD-10-CM | POA: Diagnosis not present

## 2022-06-10 DIAGNOSIS — N390 Urinary tract infection, site not specified: Secondary | ICD-10-CM | POA: Diagnosis not present

## 2022-06-10 LAB — HEMOGLOBIN AND HEMATOCRIT, BLOOD
HCT: 30.9 % — ABNORMAL LOW (ref 36.0–46.0)
Hemoglobin: 9.9 g/dL — ABNORMAL LOW (ref 12.0–15.0)

## 2022-06-10 LAB — CULTURE, BLOOD (ROUTINE X 2): Special Requests: ADEQUATE

## 2022-06-10 LAB — URINE CULTURE: Culture: 40000 — AB

## 2022-06-10 MED ORDER — AMLODIPINE BESYLATE 5 MG PO TABS
5.0000 mg | ORAL_TABLET | Freq: Every day | ORAL | Status: DC
  Administered 2022-06-10 – 2022-06-11 (×2): 5 mg via ORAL
  Filled 2022-06-10 (×2): qty 1

## 2022-06-10 MED ORDER — IOHEXOL 350 MG/ML SOLN
100.0000 mL | Freq: Once | INTRAVENOUS | Status: AC | PRN
  Administered 2022-06-10: 75 mL via INTRAVENOUS

## 2022-06-10 MED ORDER — CEFADROXIL 500 MG PO CAPS
1000.0000 mg | ORAL_CAPSULE | Freq: Two times a day (BID) | ORAL | Status: DC
  Administered 2022-06-11: 1000 mg via ORAL
  Filled 2022-06-10: qty 2

## 2022-06-10 NOTE — Consult Note (Signed)
Neurology Consultation Reason for Consult: Potential discontinuation of DAPT due to hemoglobin drop Requesting Physician: Tochukwu Agbata  CC: Fall   History is obtained from: Patient, chart review   HPI: Sherry [name] is a 89 y.o. 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  She was noted to have had a fall on 2/22 after which time her neck pain had been significantly worse.  She was started on dual antiplatelet therapy however developed a GI bleed with hemoglobin drop from 14.6 to 6.7.  EGD revealed multiple ulcers but only 1 with concern for recent bleeding which could not be clipped and therefore hemostatic agent was applied.  She was narrowed to aspirin monotherapy.  Course was additionally complicated by urinary retention and she has required Foley catheter since then.  Residual deficits include left sided weakness and ataxia.    She has also continued to have intermittent headache.  At this time she reports her headache is 6/10, improved with laying down, exacerbated by light, bifrontal in location.  She denies double vision, blood in her urine or stool (although she notes she is not sure about the blood in her stool), recent fevers chills etc.  She has also had intermittent confusion/disorientation with her hospital stays and cannot provide much history to me about the details of her recent  medical history  Subsequently she re- presented on 3/16 with vertigo and headache and was found to have new left dorsal medullary infarct as well as complicated UTI.  At this time dual antiplatelet therapy was restarted.  Subsequently she presented 4/09 with an unwitnessed fall as well as some emesis and was found to have a right frontal hematoma.  Unfortunately during the course of this hospitalization her hemoglobin has dropped from 12 to 9.5, source unclear at this time  LKW: 4/8 Thrombolytic given?: No, recent stroke IA performed?: No, exam not consistent with LVO Premorbid modified rankin scale: 3-4     3 - Moderate disability. Requires some help, but able to walk unassisted.     4 - Moderately severe disability. Unable to attend to own bodily needs without assistance, and unable to walk unassisted.  ROS: Unreliable due to altered mental status.   Past Medical History:  Diagnosis Date   Actinic keratosis    Breast cancer    C1 cervical fracture    Hypertension    Osteoarthritis    Past Surgical History:  Procedure Laterality Date   BACK SURGERY     BIOPSY  05/03/2022   Procedure: BIOPSY;  Surgeon: Benancio Deeds, MD;  Location: [facility] ENDOSCOPY;  Service: Gastroenterology;;   ESOPHAGOGASTRODUODENOSCOPY (EGD) WITH PROPOFOL N/A 05/03/2022   Procedure: ESOPHAGOGASTRODUODENOSCOPY (EGD) WITH PROPOFOL;  Surgeon: Benancio Deeds, MD;  Location: [facility] ENDOSCOPY;  Service: Gastroenterology;  Laterality: N/A;   HEMOSTASIS CONTROL  05/03/2022   Procedure: HEMOSTASIS CONTROL;  Surgeon: Benancio Deeds, MD;  Location: [facility] ENDOSCOPY;  Service: Gastroenterology;;   IR INJECT/THERA/INC NEEDLE/CATH/PLC EPI/LUMB/SAC [facility]  04/29/2021   MOHS SURGERY  Family History  Problem Relation Age of Onset   Hypertension Father     Social History:  reports that she has never smoked. She has never been exposed to tobacco smoke. She has never used smokeless tobacco. She reports current alcohol  use. She reports that she does not use drugs.   Exam: Current vital signs: BP (!) 182/76 (BP Location: Right Arm)   Pulse 91   Temp 97.6 F (36.4 C)   Resp 18   SpO2 99%  Vital signs in last 24 hours: Temp:  [97.6 F (36.4 C)-98.3 F (36.8 C)] 97.6 F (36.4 C) (04/11 0742) Pulse Rate:  [88-105] 91 (04/11 0742) Resp:  [18] 18 (04/11 0742) BP: (146-183)/(71-97) 182/76 (04/11 0742) SpO2:  [96 %-99 %] 99 % (04/11 0742)   Physical Exam  Constitutional: Appears frail Psych: Affect appropriate to situation, pleasant and cooperative Eyes: No scleral injection HENT: No oropharyngeal obstruction.  Right supraorbital hematoma with dressing in place, moderate in size MSK: no joint deformities.  Cardiovascular: Perfusing extremities well Respiratory: Effort normal, non-labored breathing GI: Soft.  No distension. There is no tenderness.  Skin: Warm dry and intact visible skin  Neuro: Mental Status: Patient is awake, alert, oriented to person, place, month, year, but cannot provide much details of her situation.  Slightly disoriented to time stating that it is Wednesday instead of Thursday Patient is able to give limited history Very mild aphasia versus confusion, reporting that the band of my watch is "wrist", otherwise able to name and repeat At times slight difficulty with complex commands such as confrontational strength testing (query motor apraxia) Cranial Nerves: II: Visual Fields are full. Pupils are equal, round, and reactive to light.  III,IV, VI: EOMI without ptosis or diploplia.  No significant nystagmus V: Facial sensation is symmetric to light touch, possibly diminished in the V1 distribution on the right VII: Facial movement is symmetric, possible very subtle left facial droop.  VIII: hearing is intact to voice, slightly hard of hearing at baseline but can hear with loud volume speech X: Uvula elevates symmetrically XI: Shoulder shrug is symmetric. XII: tongue is  midline without atrophy or fasciculations.  Motor: Tone is normal. Bulk is normal. 5/5 strength, except for pain limited right deltoid (4+/5), mild left hand weakness (4/5 and approximately 4+/5 in the left upper extremity), mild 4+/5 weakness in the left lower extremity in an upper motor neuron pattern Sensory: Sensation is symmetric to light touch and temperature in the arms and legs, except diminished in the left lower leg per patient Deep Tendon Reflexes: 2+ and symmetric in the brachioradialis and biceps, brisk, right patellar is brisker than the left patellar, 3+ versus 2+ Cerebellar: Finger-nose and heel-to-shin intact on the right, dysmetric on the left Gait:  Deferred in acute setting   NIHSS total 4 Score breakdown: 1 point for left upper extremity drift, 2 points for left upper and lower extremity ataxia, 1 point for left lower leg sensory loss   I have reviewed labs in epic and the results pertinent to this consultation are:   Basic Metabolic Panel: Recent Labs  Lab 06/08/22 0140  NA 137  K 4.0  CL 103  CO2 23  GLUCOSE 135*  BUN 18  CREATININE 0.62  CALCIUM 9.5    CBC: Recent Labs  Lab 06/08/22 0140 06/09/22 0443 06/10/22 0440  WBC 12.0* 7.1  --   NEUTROABS 8.2*  --   --   HGB 12.0 9.5* 9.9*  HCT 38.0 29.8* 30.9*  MCV 89.2 90.3  --   PLT 382 314  --     Coagulation Studies: No results for input(s): "LABPROT", "INR" in the last 72 hours.   Lab Results  Component Value Date   CHOL 233 (H) 04/26/2022   HDL 53 04/26/2022   LDLCALC 122 (H) 04/26/2022   LDLDIRECT 114 (H) 04/26/2022   TRIG 292 (H) 04/26/2022   CHOLHDL 4.4 04/26/2022   Lab Results  Component Value Date   HGBA1C 5.6 04/26/2022     I have reviewed the images obtained:  MRI brain 3/19 with left dorsal medullary infarct, personally reviewed and agree with radiology   Impression: 89 year old woman with multiple vascular risk factors as well as multifactorial gait disorder likely  secondary to myelopathy in combination with ataxia from recent stroke and previously noted orthostatic hypotension.  She has also had recent GI bleed in the setting of dual antiplatelet therapy but recurrent stroke from possible left vert dissection versus atheroembolic disease in the setting of aspirin monotherapy, all within the last 1.5 months.  To gauge risk/benefit of stopping antiplatelet agents I would like to repeat CTA head and neck as well as MRI brain.  Would also like to obtain MRI cervical spine to confirm there there is no worsening cervical pathology contributing to her urinary retention and frequent falls  Recommendations: -MRI brain without contrast -CTA head and neck -MRI cervical spine -Additional recommendations to follow this imaging -Discussed with Dr. Joylene IgoAgbata in person   Brooke DareSrishti Ayrianna Mcginniss MD-PhD [facility] [phone] [facility] coverage for [facility] is from 8 AM to 4 AM in-house and 4 PM to 8 PM by telephone/video. 8 PM to 8 AM emergent questions or overnight urgent questions should be addressed to Teleneurology On-call or [facility]; contact information can be found on AMION

## 2022-06-10 NOTE — Progress Notes (Signed)
PT Cancellation Note  Patient Details Name: DEVANN TROXLER MRN: [id number] DOB: Sep 23, 1930   Cancelled Treatment:    Reason Eval/Treat Not Completed: Fatigue/lethargy limiting ability to participate. Pt received supine politely declining PT due to fatigue. Requesting morning efforts for mobility. PT to re-attempt as able.    Delphia Grates. Fairly IV, PT, DPT Physical Therapist- [place]  [facility]  06/10/2022, 3:48 PM

## 2022-06-10 NOTE — Progress Notes (Signed)
Progress Note   Patient: Sherry [name] NPY:[id number] DOB: 1930-06-09 DOA: 06/08/2022     2 DOS: the patient was seen and examined on 06/10/2022   Brief hospital course: Sherry [name] is a 89 y.o. female with medical history significant of indwelling Foley cath (initially placed in the setting of urinary retention secondary to CVA), hypertension, hyperlipidemia, diastolic CHF, stroke, anxiety, duodenitis, peptic ulcer disease, breast cancer, C1 cervical fracture, who presents with fall.   Patient was recently hospitalized from 3/16 - 3/22 due to complicated UTI.  Urine culture was positive for E. coli which was sensitive for Rocephin. Per report, pt had an unwitnessed fall in SNF. Pt states she fell forward, and hit the front of her head. She developed hematoma in the right forehead.  She has pain in the left foot and left ankle, which seem to be moderate. Patient does not have chest pain, cough, shortness breath.  No fever or chills.  Patient has nausea and vomited once, currently no nausea vomiting, diarrhea or abdominal pain.  Patient denies symptoms of UTI. She has an indwelling Foley catheter draining cloudy urine. Foley cath was replaced and urinalysis is done from the fresh Foley catheter in ED.    Assessment and Plan:  Complicated UTI (urinary tract infection) secondary to indwelling Foley catheter Indwelling Foley catheter has been replaced.   Patient had elevated lactic acid 3.2, but no fever, WBC is 12.0, clinically does not seem to have sepsis.  Lactate has normalized Repeat urine culture yields 40,000 CFU of E. Coli Continue IV Rocephin and on discharge will place patient on oral cefadroxil 1 g twice daily to complete a 7-day course of therapy     Fall with large hematoma over the right eye Continue fall precautions Patient noted to have a drop in her H&H (12 >> 9.5) which may be hemodilutional H&H is stable CT scan of the head without contrast showed right frontal  periorbital soft tissue hematoma. Evidence of underlying calvarial fracture. Degenerative changes of the right TMJ. Neurology consult to decide continued need for dual antiplatelet therapy -Fall precaution -PT/OT       Hypertensive urgency:  Blood pressure 201/100 --> 151/88 -IV hydralazine as needed - Continue Cozaar 50 mg daily and add amlodipine 5 mg daily     Chronic diastolic CHF (congestive heart failure):  2D echo on 04/26/22 showed EF of 70-75% with grade 1 diastolic dysfunction.   Continue Cozaar       CVA (cerebral vascular accident) Hold aspirin and Plavix due to hematoma with drop in H&H Continue Lipitor   Peptic ulcer -Protonix   Protein-calorie malnutrition, moderate: Body weight 54.3 kg -Nutrition boost -Nutrition consult               Subjective: Patient is seen and examined at the bedside.  Husband is at the bedside.  Physical Exam: Vitals:   06/10/22 0447 06/10/22 0538 06/10/22 0742 06/10/22 1321  BP: (!) 170/75 (!) 148/71 (!) 182/76 (!) 161/76  Pulse: 88 (!) 105 91 87  Resp: 18  18   Temp: 97.9 F (36.6 C)  97.6 F (36.4 C)   TempSrc: Oral     SpO2: 96%  99%   General: Not in acute distress HEENT:       Eyes: PERRL, EOMI, no scleral icterus, ecchymosis around the right eye       ENT: No discharge from the ears and nose, no pharynx injection, no tonsillar enlargement.  Neck: No JVD, no bruit, no mass felt. Heme: No neck lymph node enlargement. Cardiac: S1/S2, RRR, No murmurs, No gallops or rubs. Respiratory: No rales, wheezing, rhonchi or rubs. GI: Soft, nondistended, nontender, no rebound pain, no organomegaly, BS present. GU: No hematuria Ext: No pitting leg edema bilaterally. 1+DP/PT pulse bilaterally. Musculoskeletal: No joint deformities, No joint redness or warmth, no limitation of ROM in spin. Has tenderness in left foot and ankle. Skin: has a small skin tear and hematoma in right forehead Neuro: Alert, oriented to  person  and place and place but not to time Cranial nerves II-XII grossly intact, moves all extremities.   Psych: Patient is not psychotic, no suicidal or hemocidal ideation.    Data Reviewed: Labs reviewed.  Hemoglobin is stable at 9.9 There are no new results to review at this time.  Family Communication: Discussed patient's condition and plan of care with her husband at the bedside.  Disposition: Status is: Inpatient Remains inpatient appropriate because: On IV antibiotics  Planned Discharge Destination: Skilled nursing facility    Time spent: 35 minutes  Author: Lucile Shutters, MD 06/10/2022 3:04 PM  For on call review [email or web address].

## 2022-06-10 NOTE — Care Management Important Message (Signed)
Important Message  Patient Details  Name: Sherry [name] MRN: [id number] Date of Birth: [birth year]   Medicare Important Message Given:  N/A - LOS <3 / Initial given by admissions     [name] 06/10/2022, 2:26 PM

## 2022-06-11 DIAGNOSIS — N390 Urinary tract infection, site not specified: Secondary | ICD-10-CM | POA: Diagnosis not present

## 2022-06-11 DIAGNOSIS — Z978 Presence of other specified devices: Secondary | ICD-10-CM

## 2022-06-11 DIAGNOSIS — L89151 Pressure ulcer of sacral region, stage 1: Secondary | ICD-10-CM | POA: Diagnosis present

## 2022-06-11 LAB — CBC
HCT: 32.3 % — ABNORMAL LOW (ref 36.0–46.0)
Hemoglobin: 10.4 g/dL — ABNORMAL LOW (ref 12.0–15.0)
MCH: 28.3 pg (ref 26.0–34.0)
MCHC: 32.2 g/dL (ref 30.0–36.0)
MCV: 87.8 fL (ref 80.0–100.0)
Platelets: 322 10*3/uL (ref 150–400)
RBC: 3.68 MIL/uL — ABNORMAL LOW (ref 3.87–5.11)
RDW: 14.2 % (ref 11.5–15.5)
WBC: 8.1 10*3/uL (ref 4.0–10.5)
nRBC: 0 % (ref 0.0–0.2)

## 2022-06-11 LAB — BASIC METABOLIC PANEL
Anion gap: 11 (ref 5–15)
BUN: 17 mg/dL (ref 8–23)
CO2: 21 mmol/L — ABNORMAL LOW (ref 22–32)
Calcium: 9.2 mg/dL (ref 8.9–10.3)
Chloride: 105 mmol/L (ref 98–111)
Creatinine, Ser: 0.52 mg/dL (ref 0.44–1.00)
GFR, Estimated: >60 mL/min (ref >60)
Glucose, Bld: 141 mg/dL — ABNORMAL HIGH (ref 70–99)
Potassium: 3.3 mmol/L — ABNORMAL LOW (ref 3.5–5.1)
Sodium: 137 mmol/L (ref 135–145)

## 2022-06-11 LAB — CULTURE, BLOOD (ROUTINE X 2)

## 2022-06-11 LAB — TYPE AND SCREEN
ABO/RH(D): A POS
Antibody Screen: NEGATIVE

## 2022-06-11 LAB — MAGNESIUM: Magnesium: 1.2 mg/dL — ABNORMAL LOW (ref 1.7–2.4)

## 2022-06-11 MED ORDER — POTASSIUM CHLORIDE CRYS ER 20 MEQ PO TBCR
40.0000 meq | EXTENDED_RELEASE_TABLET | Freq: Once | ORAL | Status: AC
  Administered 2022-06-11: 40 meq via ORAL
  Filled 2022-06-11: qty 2

## 2022-06-11 MED ORDER — MAGNESIUM SULFATE 2 GM/50ML IV SOLN
2.0000 g | Freq: Once | INTRAVENOUS | Status: AC
  Administered 2022-06-11: 2 g via INTRAVENOUS
  Filled 2022-06-11: qty 50

## 2022-06-11 MED ORDER — CEFADROXIL 500 MG PO CAPS: 1000.0000 mg | ORAL_CAPSULE | Freq: Two times a day (BID) | ORAL | 0 refills | Status: AC

## 2022-06-11 MED ORDER — AMLODIPINE BESYLATE 5 MG PO TABS: 5.0000 mg | ORAL_TABLET | Freq: Every day | ORAL | 0 refills | Status: AC

## 2022-06-11 NOTE — Progress Notes (Signed)
Physical Therapy Treatment Patient Details Name: Sherry [name] MRN: [id number] DOB: [birth year] Today's Date: 06/11/2022   History of Present Illness Sherry [name] is a 89 y.o. female with medical history significant of indwelling Foley cath (initially placed in the setting of urinary retention secondary to CVA), hypertension, hyperlipidemia, diastolic CHF, stroke, anxiety, duodenitis, peptic ulcer disease, breast cancer, C1 cervical fracture, who presents with fall.    PT Comments    Pt received upright in bed agreeable to PT. Spouse initially present but then exits room. Pt improving in her mobility but remains mostly limited in progression due to fear/anxiety with falling. Focus of session on optimizing safe transfers using RW with frequent hand over hand cues and repetition in hopes to optimize carryover. Pt does not require physical assist today exiting bed, STS efforts, or SPT. Pt does quickly become anxious from sitting up until sitting in recliner requiring encouragement with good carryover in reduced anxiety levels. Pt overall completing x4 STS efforts with ability to maintain safe hand placements from chair <> RW then RW<> chair. Pt situated in recliner with all needs in reach. Re-oriented to call bell and ask for assist with OOB needs. PT able to recall direction and button to hit for NSG assist. Continue to recommend additional PT services. Attending MD aware.    Recommendations for follow up therapy are one component of a multi-disciplinary discharge planning process, led by the attending physician.  Recommendations may be updated based on patient status, additional functional criteria and insurance authorization.  Follow Up Recommendations  Can patient physically be transported by private vehicle: No    Assistance Recommended at Discharge Frequent or constant Supervision/Assistance  Patient can return home with the following A lot of help with walking and/or transfers;A lot  of help with bathing/dressing/bathroom;Assist for transportation;Help with stairs or ramp for entrance;Assistance with cooking/housework   Equipment Recommendations  None recommended by PT    Recommendations for Other Services       Precautions / Restrictions Precautions Precautions: Fall Precaution Comments: dizziness with positional changes Restrictions Weight Bearing Restrictions: No     Mobility  Bed Mobility Overal bed mobility: Needs Assistance Bed Mobility: Supine to Sit     Supine to sit: Supervision, HOB elevated     General bed mobility comments: relies on bed features. Patient Response: Cooperative, Anxious  Transfers Overall transfer level: Needs assistance Equipment used: Rolling walker (2 wheels) Transfers: Sit to/from Stand, Bed to chair/wheelchair/BSC Sit to Stand: Min guard Stand pivot transfers: Min guard              Ambulation/Gait       Gait Pattern/deviations: Step-to pattern           Stairs             Wheelchair Mobility    Modified Rankin (Stroke Patients Only)       Balance Overall balance assessment: Needs assistance Sitting-balance support: Feet supported, Bilateral upper extremity supported Sitting balance-Leahy Scale: Fair       Standing balance-Leahy Scale: Fair Standing balance comment: relies on RW                            Cognition Arousal/Alertness: Awake/alert Behavior During Therapy: WFL for tasks assessed/performed, Anxious Overall Cognitive Status: History of cognitive impairments - at baseline  Exercises Other Exercises Other Exercises: x4 STS with focus on safety with transfers and LE strengthening    General Comments        Pertinent Vitals/Pain Pain Assessment Pain Assessment: Faces Faces Pain Scale: No hurt    Home Living                          Prior Function            PT Goals (current  goals can now be found in the care plan section) Acute Rehab PT Goals Patient Stated Goal: Improve headache PT Goal Formulation: With patient Time For Goal Achievement: 06/22/22 Potential to Achieve Goals: Good Progress towards PT goals: Progressing toward goals    Frequency    Min 2X/week      PT Plan Current plan remains appropriate    Co-evaluation              AM-PAC PT "6 Clicks" Mobility   Outcome Measure  Help needed turning from your back to your side while in a flat bed without using bedrails?: A Little Help needed moving from lying on your back to sitting on the side of a flat bed without using bedrails?: A Little Help needed moving to and from a bed to a chair (including a wheelchair)?: A Little Help needed standing up from a chair using your arms (e.g., wheelchair or bedside chair)?: A Little Help needed to walk in hospital room?: Total Help needed climbing 3-5 steps with a railing? : Total 6 Click Score: 14    End of Session Equipment Utilized During Treatment: Gait belt Activity Tolerance: Patient tolerated treatment well Patient left: in chair;with call bell/phone within reach;with chair alarm set Nurse Communication: Mobility status PT Visit Diagnosis: Muscle weakness (generalized) (M62.81);Difficulty in walking, not elsewhere classified (R26.2)     Time: 8144-8185 PT Time Calculation (min) (ACUTE ONLY): 26 min  Charges:  $Therapeutic Exercise: 8-22 mins $Therapeutic Activity: 8-22 mins                     Sabriah Hobbins M. Fairly IV, PT, DPT Physical Therapist- [place]  [facility]  06/11/2022, 11:02 AM

## 2022-06-11 NOTE — Discharge Summary (Signed)
Physician Discharge Summary   Patient: Sherry [name] MRN: [id number] DOB: 1930/04/09  Admit date:     06/08/2022  Discharge date: 06/11/22  Discharge Physician: Alinna Siple   PCP: Danella Penton, MD   Recommendations at discharge:   Fall precautions Complete antibiotic therapy as recommended Keep scheduled follow-up appointment with primary care provider  Discharge Diagnoses: Principal Problem:   Complicated UTI (urinary tract infection) Active Problems:   Fall   Hyperlipidemia   Hypertensive urgency   Chronic diastolic CHF (congestive heart failure)   CVA (cerebral vascular accident)   Peptic ulcer   Protein-calorie malnutrition, moderate   Decubitus ulcer of coccygeal region, stage 1   Presence of indwelling Foley catheter  Resolved Problems:   * No resolved hospital problems. *  Hospital Course: Sherry [name] is a 89 y.o. female with medical history significant for indwelling Foley cath (initially placed in the setting of urinary retention secondary to CVA), hypertension, hyperlipidemia, diastolic CHF, stroke, anxiety, duodenitis, peptic ulcer disease, breast cancer, C1 cervical fracture, who presents with fall.   Patient was recently hospitalized from 3/16 - 3/22 due to complicated UTI.  Urine culture was positive for E. coli which was sensitive for Rocephin. Per report, pt had an unwitnessed fall in SNF. Pt states she fell forward, and hit the front of her head. She developed hematoma in the right forehead.  He has pain in the left foot and left ankle, which seem to be moderate. Patient does not have chest pain, cough, shortness breath.  No fever or chills.  Patient has nausea and vomited once, currently no nausea vomiting, diarrhea or abdominal pain.  Patient denies symptoms of UTI. She has an indwelling Foley catheter draining cloudy urine. Foley cath was replaced and urinalysis is done from the fresh Foley catheter in ED.   Data reviewed independently and ED  Course: pt was found to have WBC 12.0, positive urinalysis (turbid appearance, large amount of leukocyte, negative bacteria, WBC > 50), lactic acid 3.2, GFR> 60.  Temperature normal, blood pressure 2 1/100 which improved to 151/88 after giving 0.1 mg of clonidine in ED. CT head is negative for acute intracranial abnormalities, but showed large right forehead hematoma.  CT of C-spine is negative for acute injury, but showed old C1 fracture.  Patient is placed on TeleMed for observation.   Assessment and Plan:  Complicated UTI (urinary tract infection) secondary to indwelling Foley catheter Indwelling Foley catheter has been replaced.   Patient had elevated lactic acid 3.2, but no fever, WBC was 12.0, clinically does not seem to have sepsis.  Lactate has normalized Repeat urine culture yields 40,000 CFU of E. Coli Patient was treated with IV Rocephin and will be discharged home on  oral cefadroxil 1 g twice daily to complete a 7-day course of therapy     Fall with large hematoma over the right eye Continue fall precautions Patient noted to have a drop in her H&H (12 >> 9.5) which may be hemodilutional Repeat H&H is stable CT scan of the head without contrast showed right frontal periorbital soft tissue hematoma. Evidence of underlying calvarial fracture. Degenerative changes of the right TMJ. Neurology consulted and recommends monotherapy with aspirin 81 mg due to patient's history of GI bleed and frequent falls.  She has completed a 21-day course of Plavix. -Fall precautions -Continue PT/OT       Hypertensive urgency:  Blood pressure 201/100 --> 151/88 - Continue labetalol 100 mg daily and add amlodipine 5 mg  daily     Chronic diastolic CHF (congestive heart failure):  2D echo on 04/26/22 showed EF of 70-75% with grade 1 diastolic dysfunction.   Continue amlodipine and labetalol       CVA (cerebral vascular accident) Continue aspirin 81 mg daily Patient has completed a 21-day  course of Plavix 75 mg and this has been discontinued due to increased risk for GI bleed Continue Lipitor   Peptic ulcer -Continue Protonix   Protein-calorie malnutrition, moderate: Body weight 54.3 kg -Nutrition boost              Consultants: Neurology, PT Procedures performed: MRI of the brain, CT angiogram Disposition: Skilled nursing facility Diet recommendation:  Discharge Diet Orders (From admission, onward)     Start     Ordered   06/11/22 0000  Diet - low sodium heart healthy        06/11/22 1057           Cardiac diet DISCHARGE MEDICATION: Allergies as of 06/11/2022       Reactions   Morphine    Other reaction(s): Other (See Comments) Apnea with large dose Respiratory Distress   Buprenorphine Hcl    Other reaction(s): Other (See Comments), Other (See Comments) Respiratory Distress Respiratory Distress   Codeine    Epinephrine    Other reaction(s): Other (See Comments) Severe anxiety; "makes my heart race" - would like to avoid during surgery   Morphine And Related Other (See Comments)   Respiratory Distress   Percocet [oxycodone-acetaminophen]         Medication List     STOP taking these medications    clopidogrel 75 MG tablet Commonly known as: PLAVIX   losartan 50 MG tablet Commonly known as: COZAAR       TAKE these medications    acetaminophen 325 MG tablet Commonly known as: TYLENOL Take 2 tablets (650 mg total) by mouth every 6 (six) hours as needed for headache.   amLODipine 5 MG tablet Commonly known as: NORVASC Take 1 tablet (5 mg total) by mouth daily. Start taking on: June 12, 2022   aspirin EC 81 MG tablet Take 1 tablet (81 mg total) by mouth daily. Swallow whole.   atorvastatin 40 MG tablet Commonly known as: LIPITOR Take 1 tablet (40 mg total) by mouth daily.   B-12 1000 MCG Caps Take 2,000 mcg by mouth daily.   Baclofen 5 MG Tabs Take 1 tablet (5 mg total) by mouth 2 (two) times daily as needed  (HEADACHES AND HICCUPS).   cefadroxil 500 MG capsule Commonly known as: DURICEF Take 2 capsules (1,000 mg total) by mouth 2 (two) times daily for 3 days.   cholecalciferol 25 MCG (1000 UT) tablet Generic drug: Cholecalciferol Take 2,000 Units by mouth daily.   haloperidol 1 MG tablet Commonly known as: HALDOL Take 1 tablet (1 mg total) by mouth every 6 (six) hours as needed (nausea vomiting.).   labetalol 100 MG tablet Commonly known as: NORMODYNE Take 100 mg by mouth daily.   lactose free nutrition Liqd Take 237 mLs by mouth 3 (three) times daily between meals.   loperamide 2 MG tablet Commonly known as: Imodium A-D Take 1 tablet (2 mg total) by mouth 4 (four) times daily as needed for diarrhea or loose stools.   ondansetron 4 MG tablet Commonly known as: ZOFRAN Take one tablet by mouth every 6 hours as needed.   pantoprazole 40 MG tablet Commonly known as: PROTONIX Take 1 tablet (40 mg total)  by mouth 2 (two) times daily for 21 days, THEN 1 tablet (40 mg total) daily. Start taking on: May 12, 2022   polyethylene glycol powder 17 GM/SCOOP powder Commonly known as: GLYCOLAX/MIRALAX Take 17 g by mouth every 12 (twelve) hours as needed for mild constipation.   promethazine 12.5 MG tablet Commonly known as: PHENERGAN Take 1 tablet (12.5 mg total) by mouth every 8 (eight) hours as needed for nausea or vomiting.   tamsulosin 0.4 MG Caps capsule Commonly known as: FLOMAX Take 1 capsule (0.4 mg total) by mouth daily.        Contact information for after-discharge care     Destination     [facility] .   Service: Skilled Nursing Contact information: [address] [place] [address] [phone]                    Discharge Exam: General: Not in acute distress HEENT:       Eyes: PERRL, EOMI, no scleral icterus, ecchymosis around the right eye       ENT: No discharge from the ears and nose, no pharynx injection,  no tonsillar enlargement.        Neck: No JVD, no bruit, no mass felt. Heme: No neck lymph node enlargement. Cardiac: S1/S2, RRR, No murmurs, No gallops or rubs. Respiratory: No rales, wheezing, rhonchi or rubs. GI: Soft, nondistended, nontender, no rebound pain, no organomegaly, BS present. GU: No hematuria Ext: No pitting leg edema bilaterally. 1+DP/PT pulse bilaterally. Musculoskeletal: No joint deformities, No joint redness or warmth, no limitation of ROM in spin. Has tenderness in left foot and ankle. Skin: has a small skin tear and hematoma in right forehead Neuro: Alert, oriented to  person and place and place but not to time Cranial nerves II-XII grossly intact, moves all extremities.   Psych: Patient is not psychotic, no suicidal or hemocidal ideation.    Condition at discharge: stable  The results of significant diagnostics from this hospitalization (including imaging, microbiology, ancillary and laboratory) are listed below for reference.   Imaging Studies: CT ANGIO HEAD NECK W WO CM  Result Date: 06/10/2022 CLINICAL DATA:  Stroke workup EXAM: CT ANGIOGRAPHY HEAD AND NECK WITH AND WITHOUT CONTRAST TECHNIQUE: Multidetector CT imaging of the head and neck was performed using the standard protocol during bolus administration of intravenous contrast. Multiplanar CT image reconstructions and MIPs were obtained to evaluate the vascular anatomy. Carotid stenosis measurements (when applicable) are obtained utilizing NASCET criteria, using the distal internal carotid diameter as the denominator. RADIATION DOSE REDUCTION: This exam was performed according to the departmental dose-optimization program which includes automated exposure control, adjustment of the mA and/or kV according to patient size and/or use of iterative reconstruction technique. CONTRAST:  52mL OMNIPAQUE IOHEXOL 350 MG/ML SOLN COMPARISON:  CTA head neck 04/25/22, same day brain MRI. FINDINGS: CT HEAD FINDINGS Brain: No  evidence of acute infarction, hemorrhage, hydrocephalus, extra-axial collection or mass lesion/mass effect. There is an evolving left cerebellar infarct. Sequela of mild chronic microvascular ischemic change. Vascular: See below Skull: Redemonstrated right frontal periorbital soft tissue hematoma. Evidence of underlying calvarial fracture. Degenerative changes of the right TMJ. Sinuses/Orbits: Trace right mastoid effusion. No middle ear effusion. Frothy secretions in the left sphenoid sinus could be seen in the setting of acute sinusitis. Visualized orbits are unremarkable. Other: None. Review of the MIP images confirms the above findings CTA NECK FINDINGS Aortic arch: Standard branching. Imaged portion shows no evidence of  aneurysm or dissection. No significant stenosis of the major arch vessel origins. Right carotid system: No evidence of dissection, stenosis (50% or greater), or occlusion. Left carotid system: No evidence of dissection, stenosis (50% or greater), or occlusion. Vertebral arteries: Left vertebral artery is occluded at the V4 segment. There is reconstitution in the V3 and V4 segments, possibly secondary to retrograde flow. Right vertebral artery is normal in caliber. Skeleton: Redemonstrated is a chronic type 2 dens fracture. Multilevel degenerative changes in the cervical spine with moderate to severe disc space loss lower cervical spine. Other neck: Negative. Upper chest: Negative. Review of the MIP images confirms the above findings CTA HEAD FINDINGS Anterior circulation: Mild narrowing of the supraclinoid ICA on the left. Unchanged 2 mm posteriorly projecting outpouching at the proximal supraclinoid ICA on the right (series 10, image 129). Multifocal sites of moderate narrowing in the bilateral distal MCA territories (distal M2/M3). Moderate to severe stenoses in the bilateral distal A2 segments. Posterior circulation: No significant stenosis, proximal occlusion, aneurysm, or vascular  malformation. Left PICA is intermittently contrast opacify. The distal portions are incompletely assessed. Venous sinuses: As permitted by contrast timing, patent. Diminutive left transverse sinus. Anatomic variants: None Review of the MIP images confirms the above findings IMPRESSION: 1. Evolving left cerebellar infarct. No acute intracranial hemorrhage or CT evidence of new infarct. 2. 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 3. Multifocal sites of moderate narrowing in the bilateral distal MCA and ACA territories territories. 4. Unchanged 2 mm posteriorly projecting outpouching at the proximal supraclinoid ICA on the right, which could represent a small aneurysm. 5. Frothy secretions in the left sphenoid sinus could be seen in the setting of acute sinusitis. Electronically Signed   By: Lorenza Cambridge M.D.   On: 06/10/2022 13:27   MR CERVICAL SPINE WO CONTRAST  Result Date: 06/10/2022 CLINICAL DATA:  Fall forward. Trauma to head. Chronic dens fracture. Myelopathy, acute, cervical spine. EXAM: MRI CERVICAL SPINE WITHOUT CONTRAST TECHNIQUE: Multiplanar, multisequence MR imaging of the cervical spine was performed. No intravenous contrast was administered. COMPARISON:  MRI cervical spine 05/30/2021. CT of the cervical spine 06/08/2022 FINDINGS: Alignment: Slight anterolisthesis at C4-5 is stable. Slight anterolisthesis at T1-2 is stable. Straightening of the normal cervical lordosis is stable. Vertebrae: Chronic type 2 Modic changes are present at C5-6 and C6-7. Marrow signal and vertebral body heights are otherwise normal. No acute fractures are present. The chronic type 2 dens fracture is stable. Cord: Stable slight distortion of the spinal cord at the C5-6 level. Cord morphology is otherwise normal. No abnormal cord signal is present. Posterior Fossa, vertebral arteries, paraspinal tissues: The craniocervical junction is normal. Abnormal signal in the left V3 segment  is consistent with chronic left vertebral artery occlusion. Disc levels: C2-3: Bilateral facet hypertrophy is present without focal stenosis. C3-4: Moderate facet hypertrophy is again noted bilaterally. A broad-based disc osteophyte complex partially effaces the ventral CSF. Moderate left and mild right foraminal stenosis is similar the prior study. C4-5: A leftward disc osteophyte complex is present. Asymmetric left-sided facet hypertrophy is similar the prior studies moderate left and mild right foraminal stenosis is present. C5-6: A broad-based disc osteophyte complex effaces the ventral CSF. Severe left and moderate right foraminal stenosis is present. Asymmetric left-sided facet hypertrophy is present. C6-7: A broad-based disc osteophyte complex effaces the ventral CSF. Uncovertebral and facet disease results in moderate foraminal stenosis bilaterally, similar the prior study. C7-T1: Moderate facet hypertrophy is present bilaterally.  No significant stenosis is present. T1-2: The uncovered disc effaces the ventral CSF. The foramina are patent. IMPRESSION: 1. Stable multilevel spondylosis of the cervical spine. 2. No evidence for acute trauma to the cervical spinal cord. 3. Chronic type 2 dens fracture. 4. Moderate left and mild right foraminal stenosis at C3-4 and C4-5. 5. Severe left and moderate right foraminal stenosis at C5-6. 6. Moderate foraminal stenosis bilaterally at C6-7. 7. Chronic left vertebral artery occlusion. Electronically Signed   By: Marin Roberts M.D.   On: 06/10/2022 12:49   MR BRAIN WO CONTRAST  Result Date: 06/10/2022 CLINICAL DATA:  Neuro deficit, acute, stroke suspected. Trauma with hematoma. EXAM: MRI HEAD WITHOUT CONTRAST TECHNIQUE: Multiplanar, multiecho pulse sequences of the brain and surrounding structures were obtained without intravenous contrast. COMPARISON:  CT head without contrast 06/08/2022. MR head without contrast 05/18/2022. FINDINGS: Brain: The  diffusion-weighted images demonstrate no acute or subacute infarct. Expected evolution noted in the posteromedial infarct of the left medulla. Encephalomalacia involving the medial and inferior left cerebellum is slightly more prominent than the prior study, also consistent with expected evolution. No acute diffusion abnormality is associated. Remote lacunar infarcts of the left caudate head and lentiform nucleus are stable. Remote lacunar infarcts of the left thalamus are stable. Moderate generalized white matter changes are similar the prior exam. The ventricles are of normal size. No significant extraaxial fluid collection is present. The brainstem and cerebellum are within normal limits. Midline structures are within normal limits. Vascular: Abnormal flow signal is present in the left vertebral artery, consistent with chronic occlusion. This corresponds to the areas of recent infarct. Flow is present in the right vertebral artery and basilar artery. Flow is present in the internal carotid arteries bilaterally. Skull and upper cervical spine: The chronic dens fracture is again noted. The craniocervical junction is within normal limits proper cervical spine is otherwise normal. Sinuses/Orbits: Right mastoid effusion is present. The paranasal sinuses and mastoid air cells are otherwise clear. Bilateral lens replacements are noted. Globes and orbits are otherwise unremarkable. IMPRESSION: 1. No acute intracranial abnormality or significant interval change. 2. Expected evolution of the posteromedial left medulla infarct. 3. Encephalomalacia involving the medial and inferior left cerebellum is slightly more prominent than the prior study, also consistent with expected evolution. 4. Remote lacunar infarcts of the left caudate head and lentiform nucleus and left thalamus are stable. 5. Moderate generalized white matter disease is stable. This likely reflects the sequela of chronic microvascular ischemia. 6. Chronic  occlusion of the left vertebral artery. 7. Chronic dens fracture. Electronically Signed   By: Marin Roberts M.D.   On: 06/10/2022 12:42   DG Foot 2 Views Left  Result Date: 06/08/2022 CLINICAL DATA:  Fall with abrasion to the left lateral foot and ankle associated with swelling EXAM: LEFT FOOT - 2 VIEW; LEFT ANKLE - 2 VIEW COMPARISON:  Left ankle radiograph dated 04/26/2021 FINDINGS: There are no findings of fracture or dislocation. No joint effusion. Degenerative changes of the foot. Ankle mortise is intact. Soft tissues are unremarkable. IMPRESSION: No acute fracture or dislocation. Electronically Signed   By: Agustin Cree M.D.   On: 06/08/2022 10:34   DG Ankle 2 Views Left  Result Date: 06/08/2022 CLINICAL DATA:  Fall with abrasion to the left lateral foot and ankle associated with swelling EXAM: LEFT FOOT - 2 VIEW; LEFT ANKLE - 2 VIEW COMPARISON:  Left ankle radiograph dated 04/26/2021 FINDINGS: There are no findings of fracture or dislocation. No joint effusion. Degenerative  changes of the foot. Ankle mortise is intact. Soft tissues are unremarkable. IMPRESSION: No acute fracture or dislocation. Electronically Signed   By: Agustin Cree M.D.   On: 06/08/2022 10:34   CT HEAD WO CONTRAST ( )  Result Date: 06/08/2022 CLINICAL DATA:  Unwitnessed fall. Large hematoma above the right eyebrow. Vomiting in triage. EXAM: CT HEAD WITHOUT CONTRAST CT CERVICAL SPINE WITHOUT CONTRAST TECHNIQUE: Multidetector CT imaging of the head and cervical spine was performed following the standard protocol without intravenous contrast. Multiplanar CT image reconstructions of the cervical spine were also generated. RADIATION DOSE REDUCTION: This exam was performed according to the departmental dose-optimization program which includes automated exposure control, adjustment of the mA and/or kV according to patient size and/or use of iterative reconstruction technique. COMPARISON:  MRI head 05/18/2022 and MRI cervical spine  12/30/2021; CT head and cervical spine 12/30/2021 FINDINGS: CT HEAD FINDINGS Brain: No intracranial hemorrhage, mass effect, or evidence of acute infarct. No hydrocephalus. No extra-axial fluid collection. Generalized cerebral atrophy. Ill-defined hypoattenuation within the cerebral white matter is nonspecific but consistent with chronic small vessel ischemic disease. Chronic left cerebellar and right frontal lobe infarcts. Chronic lacunar infarct left basal ganglia. Vascular: No hyperdense vessel. Intracranial arterial calcification. Skull: Large hematoma about the right forehead. No underlying calvarial fracture. Sinuses/Orbits: No acute finding. Mild mucosal thickening left sphenoid sinus. Opacification of a few right mastoid air cells. Other: None. CT CERVICAL SPINE FINDINGS Alignment: No evidence of traumatic malalignment. Unchanged multilevel grade 1 anterolisthesis of C2, C3, C4 and C7. Skull base and vertebrae: No acute fracture. Redemonstrated type 2 dens fracture and mildly displaced bilateral posterior arch fractures of C1. The dens fracture appears slightly more distracted and angulated posteriorly. No evidence of spinal canal narrowing. No acute fracture. Soft tissues and spinal canal: No prevertebral fluid or swelling. No visible canal hematoma. Disc levels: Advanced multilevel spondylosis, disc space height loss, degenerative endplate changes greatest at C5-C6 and C6-C7. Multilevel advanced facet arthropathy. Posterior disc osteophyte complexes C5-C6 and C6-C7 cause mild-moderate effacement of the ventral thecal sac. Uncovertebral spurring and facet arthropathy cause advanced multilevel neural foraminal narrowing. Upper chest: No acute abnormality. Other: None. IMPRESSION: 1. No acute intracranial abnormality. 2. Large scalp hematoma about the right forehead. No calvarial fracture. 3. No acute fracture in the cervical spine. Redemonstrated type 2 dens fracture and bilateral posterior arch of C1  fractures. Electronically Signed   By: Minerva Fester M.D.   On: 06/08/2022 02:06   CT Cervical Spine Wo Contrast  Result Date: 06/08/2022 CLINICAL DATA:  Unwitnessed fall. Large hematoma above the right eyebrow. Vomiting in triage. EXAM: CT HEAD WITHOUT CONTRAST CT CERVICAL SPINE WITHOUT CONTRAST TECHNIQUE: Multidetector CT imaging of the head and cervical spine was performed following the standard protocol without intravenous contrast. Multiplanar CT image reconstructions of the cervical spine were also generated. RADIATION DOSE REDUCTION: This exam was performed according to the departmental dose-optimization program which includes automated exposure control, adjustment of the mA and/or kV according to patient size and/or use of iterative reconstruction technique. COMPARISON:  MRI head 05/18/2022 and MRI cervical spine 12/30/2021; CT head and cervical spine 12/30/2021 FINDINGS: CT HEAD FINDINGS Brain: No intracranial hemorrhage, mass effect, or evidence of acute infarct. No hydrocephalus. No extra-axial fluid collection. Generalized cerebral atrophy. Ill-defined hypoattenuation within the cerebral white matter is nonspecific but consistent with chronic small vessel ischemic disease. Chronic left cerebellar and right frontal lobe infarcts. Chronic lacunar infarct left basal ganglia. Vascular: No hyperdense vessel. Intracranial arterial calcification. Skull:  Large hematoma about the right forehead. No underlying calvarial fracture. Sinuses/Orbits: No acute finding. Mild mucosal thickening left sphenoid sinus. Opacification of a few right mastoid air cells. Other: None. CT CERVICAL SPINE FINDINGS Alignment: No evidence of traumatic malalignment. Unchanged multilevel grade 1 anterolisthesis of C2, C3, C4 and C7. Skull base and vertebrae: No acute fracture. Redemonstrated type 2 dens fracture and mildly displaced bilateral posterior arch fractures of C1. The dens fracture appears slightly more distracted and  angulated posteriorly. No evidence of spinal canal narrowing. No acute fracture. Soft tissues and spinal canal: No prevertebral fluid or swelling. No visible canal hematoma. Disc levels: Advanced multilevel spondylosis, disc space height loss, degenerative endplate changes greatest at C5-C6 and C6-C7. Multilevel advanced facet arthropathy. Posterior disc osteophyte complexes C5-C6 and C6-C7 cause mild-moderate effacement of the ventral thecal sac. Uncovertebral spurring and facet arthropathy cause advanced multilevel neural foraminal narrowing. Upper chest: No acute abnormality. Other: None. IMPRESSION: 1. No acute intracranial abnormality. 2. Large scalp hematoma about the right forehead. No calvarial fracture. 3. No acute fracture in the cervical spine. Redemonstrated type 2 dens fracture and bilateral posterior arch of C1 fractures. Electronically Signed   By: Minerva Fester M.D.   On: 06/08/2022 02:06   MR BRAIN WO CONTRAST  Result Date: 05/18/2022 CLINICAL DATA:  Provided history: Recent stroke. Ongoing vertigo. History of breast cancer. 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 EXAM: MRI HEAD WITHOUT CONTRAST TECHNIQUE: Multiplanar, multiecho pulse sequences of the brain and surrounding structures were obtained without intravenous contrast. COMPARISON:  Prior head CT examinations 05/15/2022 and earlier. Brain MRI 04/25/2022. FINDINGS: Brain: Mild generalized parenchymal atrophy. New from the prior brain MRI of 04/25/2022, there is a 4 mm acute infarct within the left dorsolateral aspect of the medulla (series 5, image 6). Known moderately large late subacute left PICA territory infarct within the left cerebellar hemisphere. Redemonstrated small focus of chronic encephalomalacia/gliosis within the anterior right frontal lobe, which may be posttraumatic in etiology or may reflect a chronic infarct. Redemonstrated small chronic cortical infarct within the right occipital lobe.  Background moderate multifocal T2 FLAIR hyperintense signal abnormality within the cerebral white matter, nonspecific but compatible with chronic small vessel ischemic disease. Redemonstrated chronic lacunar infarcts within the left basal ganglia/internal capsule. Punctate chronic microhemorrhage within the left frontoparietal white matter. No evidence of an intracranial mass. No extra-axial fluid collection. No midline shift. Vascular: Signal abnormality within the intracranial left vertebral artery compatible with known vessel occlusion. Flow voids preserved elsewhere within the proximal large arterial vessels. 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 Incompletely assessed cervical spondylosis. Sinuses/Orbits: No mass or acute finding within the imaged orbits. Prior bilateral ocular lens replacement. Mucous retention cyst within a posterior right ethmoid air cell. Small mucous retention cyst within the left sphenoid sinus. Other: Right mastoid effusion. Trace fluid also present within the left mastoid air cells. Impression #1 will be called to the ordering clinician or representative by the Radiologist Assistant, and communication documented in the PACS or [employer]. IMPRESSION: 1. 4 mm acute infarct within the left dorsolateral aspect of the medulla, new from the prior brain MRI of 04/25/2022. 2. Known moderately large late subacute left PICA territory infarct within the left cerebellar hemisphere. 3. Redemonstrated small focus of chronic encephalomalacia/gliosis within the anterior right frontal lobe, which may be posttraumatic in etiology or may reflect a chronic infarct. 4. Redemonstrated small chronic cortically-based infarct within the right occipital lobe. 5.  Background parenchymal atrophy and chronic small vessel ischemic disease with chronic small-vessel infarcts, as described. 6. Signal abnormality within the intracranial left vertebral artery  compatible with known vessel occlusion. 7. Known chronic nonunited type 2 dens fracture. 8. Paranasal sinus disease, as outlined. 9. Right mastoid effusion. Electronically Signed   By: Jackey Loge D.O.   On: 05/18/2022 12:46   CT Abdomen Pelvis W Contrast  Result Date: 05/15/2022 CLINICAL DATA:  Abdomen pain EXAM: CT ABDOMEN AND PELVIS WITH CONTRAST TECHNIQUE: Multidetector CT imaging of the abdomen and pelvis was performed using the standard protocol following bolus administration of intravenous contrast. RADIATION DOSE REDUCTION: This exam was performed according to the departmental dose-optimization program which includes automated exposure control, adjustment of the mA and/or kV according to patient size and/or use of iterative reconstruction technique. CONTRAST:  OMNIPAQUE IOHEXOL 300 MG/ML  SOLN COMPARISON:  CT 05/02/2022, 05/17/2021 FINDINGS: Lower chest: Lung bases demonstrate small left greater than right pleural effusions which are new. Partial consolidations in the lower lobes favored to represent atelectasis. Mild scarring right middle lobe Hepatobiliary: Distended gallbladder with stones. No inflammatory change or biliary dilatation. Small cyst within the central liver. Pancreas: No inflammation. Stable 8 mm cyst at the uncinate process. Spleen: Normal in size without focal abnormality. Adrenals/Urinary Tract: Adrenal glands are normal. Kidneys show no hydronephrosis. The bladder contains Foley catheter and appears slightly thick-walled with mild mucosal enhancement. Stomach/Bowel: Stomach is nonenlarged. Thickening of the pylorus with mucosal enhancement. No dilated small bowel. Large feces in the colon and rectum. Vascular/Lymphatic: Moderate aortic atherosclerosis. No aneurysm. No suspicious lymph nodes. Reproductive: Status post hysterectomy. No adnexal masses. Other: Negative for pelvic effusion.  No free air. Musculoskeletal: No acute or suspicious osseous abnormality. Multilevel  degenerative change with grade 1 anterolisthesis L5 on S1. Stable 1.8 cm nodule in the right inferior breast. IMPRESSION: 1. Small left greater than right pleural effusions with partial consolidations in the lower lobes favored to represent atelectasis. 2. Distended gallbladder with stones but no inflammatory change or biliary dilatation. 3. Thickening of the pylorus with mucosal enhancement, suspicious for peptic ulcer disease. No evidence for perforation at this time. 4. Slightly thick-walled appearance of the urinary bladder with mild mucosal enhancement, correlate for cystitis. 5. Large volume of feces in the colon and rectum suggesting constipation. 6. Stable 1.8 cm right inferior breast nodule 7. Aortic atherosclerosis. Aortic Atherosclerosis (ICD10-I70.0). Electronically Signed   By: Jasmine Pang M.D.   On: 05/15/2022 17:15   DG Chest Port 1 View  Result Date: 05/15/2022 CLINICAL DATA:  Weakness EXAM: PORTABLE CHEST 1 VIEW COMPARISON:  Radiograph 05/01/2022 FINDINGS: Unchanged cardiomediastinal silhouette. There is no new focal airspace consolidation. There is no pleural effusion or evidence of pneumothorax. Bilateral shoulder degenerative changes with unchanged osteochondral joint bodies. Thoracic spondylosis. IMPRESSION: No evidence of acute cardiopulmonary disease. Electronically Signed   By: Caprice Renshaw M.D.   On: 05/15/2022 15:20   CT Head Wo Contrast  Result Date: 05/15/2022 CLINICAL DATA:  Concern for acute stroke EXAM: CT HEAD WITHOUT CONTRAST TECHNIQUE: Contiguous axial images were obtained from the base of the skull through the vertex without intravenous contrast. RADIATION DOSE REDUCTION: This exam was performed according to the departmental dose-optimization program which includes automated exposure control, adjustment of the mA and/or kV according to patient size and/or use of iterative reconstruction technique. COMPARISON:  None Available. FINDINGS: Brain: No acute intracranial  hemorrhage. No focal mass lesion. No CT evidence of acute infarction. No midline shift or  mass effect. No hydrocephalus. Basilar cisterns are patent. There are periventricular and subcortical white matter hypodensities. Generalized cortical atrophy. Vascular: No hyperdense vessel or unexpected calcification. Skull: Normal. Negative for fracture or focal lesion. Sinuses/Orbits: Paranasal sinuses and mastoid air cells are clear. Orbits are clear. Other: None. IMPRESSION: 1. No acute intracranial findings. 2. Atrophy and white matter microvascular disease. Electronically Signed   By: Genevive Bi M.D.   On: 05/15/2022 15:16    Microbiology: Results for orders placed or performed during the hospital encounter of 06/08/22  Urine Culture     Status: Abnormal   Collection Time: 06/08/22  4:57 AM   Specimen: Urine, Catheterized  Result Value Ref Range Status   Specimen Description   Final    URINE, CATHETERIZED Performed at [facility], [address] [place], [place] [address]    Special Requests   Final    NONE Performed at [facility], [address] [place], [place] [address]    Culture 40,000 COLONIES/mL ESCHERICHIA COLI (A)  Final   Report Status 06/10/2022 FINAL  Final   Organism ID, Bacteria ESCHERICHIA COLI (A)  Final      Susceptibility   Escherichia coli - MIC*    AMPICILLIN >=32 RESISTANT Resistant     CEFAZOLIN <=4 SENSITIVE Sensitive     CEFEPIME 1 SENSITIVE Sensitive     CEFTRIAXONE <=0.25 SENSITIVE Sensitive     CIPROFLOXACIN 0.5 INTERMEDIATE Intermediate     GENTAMICIN <=1 SENSITIVE Sensitive     IMIPENEM <=0.25 SENSITIVE Sensitive     NITROFURANTOIN <=16 SENSITIVE Sensitive     TRIMETH/SULFA <=20 SENSITIVE Sensitive     AMPICILLIN/SULBACTAM >=32 RESISTANT Resistant     PIP/TAZO >=128 RESISTANT Resistant     * 40,000 COLONIES/mL ESCHERICHIA COLI  Blood culture (routine x 2)     Status: None (Preliminary result)   Collection Time: 06/08/22  6:54  AM   Specimen: BLOOD RIGHT ARM  Result Value Ref Range Status   Specimen Description BLOOD RIGHT ARM  Final   Special Requests   Final    BOTTLES DRAWN AEROBIC AND ANAEROBIC Blood Culture adequate volume   Culture   Final    NO GROWTH 3 DAYS Performed at [facility], [address] [place], [place] [address]    Report Status PENDING  Incomplete  Blood culture (routine x 2)     Status: Abnormal   Collection Time: 06/08/22  6:54 AM   Specimen: BLOOD LEFT HAND  Result Value Ref Range Status   Specimen Description   Final    BLOOD LEFT HAND Performed at [facility], [address] [place], [place] [address]    Special Requests   Final    BOTTLES DRAWN AEROBIC AND ANAEROBIC Blood Culture results may not be optimal due to an inadequate volume of blood received in culture bottles Performed at [facility], [address] [place], [place] [address]    Culture  Setup Time   Final    GRAM POSITIVE COCCI IN BOTH AEROBIC AND ANAEROBIC BOTTLES CRITICAL RESULT CALLED TO, READ BACK BY AND VERIFIED WITH: JUSTIN MILLER AT 1614 ON 06/09/22 BY SS Performed at [facility], [address] [place], [place] [address]    Culture (A)  Final    STAPHYLOCOCCUS EPIDERMIDIS THE SIGNIFICANCE OF ISOLATING THIS ORGANISM FROM A SINGLE SET OF BLOOD CULTURES WHEN MULTIPLE SETS ARE DRAWN IS UNCERTAIN. PLEASE NOTIFY THE MICROBIOLOGY DEPARTMENT WITHIN ONE WEEK IF SPECIATION AND SENSITIVITIES ARE  REQUIRED. Performed at [facility], [address], [place], [place] [address]    Report Status 06/11/2022 FINAL  Final  Blood Culture ID Panel (Reflexed)     Status: Abnormal   Collection Time: 06/08/22  6:54 AM  Result Value Ref Range Status   Enterococcus faecalis NOT DETECTED NOT DETECTED Final   Enterococcus Faecium NOT DETECTED NOT DETECTED Final   Listeria monocytogenes NOT DETECTED NOT DETECTED Final   Staphylococcus species DETECTED (A) NOT DETECTED Final     Comment: CRITICAL RESULT CALLED TO, READ BACK BY AND VERIFIED WITH: JUSTIN MILLER AT 1614 ON 06/09/22 BY SS    Staphylococcus aureus (BCID) NOT DETECTED NOT DETECTED Final   Staphylococcus epidermidis DETECTED (A) NOT DETECTED Final    Comment: Methicillin (oxacillin) resistant coagulase negative staphylococcus. Possible blood culture contaminant (unless isolated from more than one blood culture draw or clinical case suggests pathogenicity). No antibiotic treatment is indicated for blood  culture contaminants. CRITICAL RESULT CALLED TO, READ BACK BY AND VERIFIED WITH: JUSTIN MILLER AT 1614 ON 06/09/22 BY SS    Staphylococcus lugdunensis NOT DETECTED NOT DETECTED Final   Streptococcus species NOT DETECTED NOT DETECTED Final   Streptococcus agalactiae NOT DETECTED NOT DETECTED Final   Streptococcus pneumoniae NOT DETECTED NOT DETECTED Final   Streptococcus pyogenes NOT DETECTED NOT DETECTED Final   A.calcoaceticus-baumannii NOT DETECTED NOT DETECTED Final   Bacteroides fragilis NOT DETECTED NOT DETECTED Final   Enterobacterales NOT DETECTED NOT DETECTED Final   Enterobacter cloacae complex NOT DETECTED NOT DETECTED Final   Escherichia coli NOT DETECTED NOT DETECTED Final   Klebsiella aerogenes NOT DETECTED NOT DETECTED Final   Klebsiella oxytoca NOT DETECTED NOT DETECTED Final   Klebsiella pneumoniae NOT DETECTED NOT DETECTED Final   Proteus species NOT DETECTED NOT DETECTED Final   Salmonella species NOT DETECTED NOT DETECTED Final   Serratia marcescens NOT DETECTED NOT DETECTED Final   Haemophilus influenzae NOT DETECTED NOT DETECTED Final   Neisseria meningitidis NOT DETECTED NOT DETECTED Final   Pseudomonas aeruginosa NOT DETECTED NOT DETECTED Final   Stenotrophomonas maltophilia NOT DETECTED NOT DETECTED Final   Candida albicans NOT DETECTED NOT DETECTED Final   Candida auris NOT DETECTED NOT DETECTED Final   Candida glabrata NOT DETECTED NOT DETECTED Final   Candida krusei NOT  DETECTED NOT DETECTED Final   Candida parapsilosis NOT DETECTED NOT DETECTED Final   Candida tropicalis NOT DETECTED NOT DETECTED Final   Cryptococcus neoformans/gattii NOT DETECTED NOT DETECTED Final   Methicillin resistance mecA/C DETECTED (A) NOT DETECTED Final    Comment: CRITICAL RESULT CALLED TO, READ BACK BY AND VERIFIED WITH: JUSTIN MILLER AT 1614 ON 06/09/22 BY SS Performed at [facility], [address] [place], [place] [address]     Labs: CBC: Recent Labs  Lab 06/08/22 0140 06/09/22 0443 06/10/22 0440 06/11/22 0949  WBC 12.0* 7.1  --  8.1  NEUTROABS 8.2*  --   --   --   HGB 12.0 9.5* 9.9* 10.4*  HCT 38.0 29.8* 30.9* 32.3*  MCV 89.2 90.3  --  87.8  PLT 382 314  --  322   Basic Metabolic Panel: Recent Labs  Lab 06/08/22 0140 06/11/22 0949  NA 137 137  K 4.0 3.3*  CL 103 105  CO2 23 21*  GLUCOSE 135* 141*  BUN 18 17  CREATININE 0.62 0.52  CALCIUM 9.5 9.2  MG  --  1.2*   Liver Function Tests: No results for input(s): "AST", "ALT", "  ALKPHOS", "BILITOT", "PROT", "ALBUMIN" in the last 168 hours. CBG: No results for input(s): "GLUCAP" in the last 168 hours.  Discharge time spent: greater than 30 minutes.  Signed: Lucile Shutters, MD [facility] 06/11/2022

## 2022-06-11 NOTE — Progress Notes (Signed)
Transport provided with report and discharge instructions and questions and concerns addressed at this time. Iv removed with no complications. Belongings gathered with patient and patient escorted off unit by members of transport team

## 2022-06-11 NOTE — Progress Notes (Signed)
Report called to Merdis Delay RN at [facility] regarding patient updates and transfer. All questions addressed at this time

## 2022-06-11 NOTE — Progress Notes (Signed)
Neurology Progress Note  Subjective: - No acute complaints, feels good this morning  Exam: Vitals:   06/11/22 0431 06/11/22 0708  BP: (!) 157/65 (!) 160/85  Pulse: 73 79  Resp: 20 18  Temp: 97.8 F (36.6 C) 98.3 F (36.8 C)  SpO2: 98% 98%   Gen: In bed, comfortable  Resp: non-labored breathing, no grossly audible wheezing Cardiac: Perfusing extremities well  Abd: soft, nt  Neuro: MS: Awake alert, cannot state month/year and states she is at her hometown hospital instead of [place]. Able to name days of the week backwards  Motor: Slight LUE pronator drift Coordination: Impaired LUE and LLE coordination  Sensory: Today reports equal sensation throughout the left side   Pertinent Labs:  Basic Metabolic Panel: Recent Labs  Lab 06/08/22 0140  NA 137  K 4.0  CL 103  CO2 23  GLUCOSE 135*  BUN 18  CREATININE 0.62  CALCIUM 9.5    CBC: Recent Labs  Lab 06/08/22 0140 06/09/22 0443 06/10/22 0440 06/11/22 0949  WBC 12.0* 7.1  --  8.1  NEUTROABS 8.2*  --   --   --   HGB 12.0 9.5* 9.9* 10.4*  HCT 38.0 29.8* 30.9* 32.3*  MCV 89.2 90.3  --  87.8  PLT 382 314  --  322    Coagulation Studies: No results for input(s): "LABPROT", "INR" in the last 72 hours.    CTA head and neck personally reviewed, agree with radiology:   1. Evolving left cerebellar infarct. No acute intracranial hemorrhage or CT evidence of new infarct. 2. 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 3. Multifocal sites of moderate narrowing in the bilateral distal MCA and ACA territories territories. 4. Unchanged 2 mm posteriorly projecting outpouching at the proximal supraclinoid ICA on the right, which could represent a small aneurysm. 5. Frothy secretions in the left sphenoid sinus could be seen in the setting of acute sinusitis.  MRI C-spine personally reviewed, agree with radiology:   1. Stable multilevel spondylosis of the cervical spine. 2. No  evidence for acute trauma to the cervical spinal cord. 3. Chronic type 2 dens fracture. 4. Moderate left and mild right foraminal stenosis at C3-4 and C4-5. 5. Severe left and moderate right foraminal stenosis at C5-6. 6. Moderate foraminal stenosis bilaterally at C6-7. 7. Chronic left vertebral artery occlusion.  MRI brain w/o contrast personally reviewed, agree with radiology:   1. No acute intracranial abnormality or significant interval change. 2. Expected evolution of the posteromedial left medulla infarct. 3. Encephalomalacia involving the medial and inferior left cerebellum is slightly more prominent than the prior study, also consistent with expected evolution. 4. Remote lacunar infarcts of the left caudate head and lentiform nucleus and left thalamus are stable. 5. Moderate generalized white matter disease is stable. This likely reflects the sequela of chronic microvascular ischemia. 6. Chronic occlusion of the left vertebral artery. 7. Chronic dens fracture.  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  Her initial stroke (predominantly left cerebellum) was 2/25, after which she was started on dual antiplatelet therapy for left vertebral atherosclerotic disease versus dissection, narrowed to aspirin after GI bleed treated with hemostasis by GI, with recurrent stroke 3/16 after which dual antiplatelet therapy was restarted.  This hospitalization she has had hemoglobin dropped again (presented with fall again), but fortunately imaging shows no  new intracranial process with stable left vertebral occlusion.  She has completed more than 21 days of dual antiplatelet therapy since her last stroke. Discussed risk/benefit with husband at bedside, who agreed with plan.  Recommendations: -Repeat CBC -Given  stability recommend aspirin monotherapy (81 mg daily) if Dr. Joylene Igo feels she will tolerate this from recent GI bleed perspective, okay to stop Plavix at this time -Inpatient neurology will be available as needed, please reach out if any additional questions/concerns arise, recs conveyed via secure chat  [name] MD-PhD [facility] [phone]  [facility] coverage for [facility] is from 8 AM to 4 AM in-house and 4 PM to 8 PM by telephone/video. 8 PM to 8 AM emergent questions or overnight urgent questions should be addressed to Teleneurology On-call or [facility] neurohospitalist; contact information can be found on [employer]ION

## 2022-06-11 NOTE — TOC Transition Note (Signed)
Transition of Care [facility]) - CM/SW Discharge Note   Patient Details  Name: Sherry [name] MRN: [id number] Date of Birth: 11/18/1930  [facility] CM/SW Contact:  Margarito Liner, LCSW Phone Number: 06/11/2022, 12:52 PM   Clinical Narrative:  Patient has orders to discharge back to [facility] today. RN will call report to [phone] (Room 116). EMS transport has been arranged. No further concerns. CSW signing off.  Final next level of care: Skilled Nursing Facility Barriers to Discharge: Barriers Resolved   Patient Goals and CMS Choice   Choice offered to / list presented to : Spouse  Discharge Placement     Existing PASRR number confirmed : 06/09/22          Patient chooses bed at: [facility] Patient to be transferred to facility by: EMS Name of family member notified: Monasia Spedale Patient and family notified of of transfer: 06/11/22  Discharge Plan and Services Additional resources added to the After Visit Summary for                                       Social Determinants of Health (SDOH) Interventions SDOH Screenings   Food Insecurity: No Food Insecurity (05/15/2022)  Housing: Low Risk  (05/15/2022)  Transportation Needs: No Transportation Needs (05/15/2022)  Utilities: Not At Risk (05/15/2022)  Tobacco Use: Low Risk  (06/08/2022)     Readmission Risk Interventions    06/09/2022    2:06 PM 01/07/2022    2:50 PM  Readmission Risk Prevention Plan  Post Dischage Appt  Complete  Medication Screening  Complete  Transportation Screening Complete Complete  HRI or Home Care Consult Complete   Palliative Care Screening Not Applicable   Medication Review (RN Care Manager) Complete

## 2022-06-12 LAB — CULTURE, BLOOD (ROUTINE X 2)

## 2022-06-13 LAB — CULTURE, BLOOD (ROUTINE X 2)

## 2022-06-16 ENCOUNTER — Encounter: Payer: Self-pay | Admitting: Student

## 2022-06-16 ENCOUNTER — Non-Acute Institutional Stay (SKILLED_NURSING_FACILITY): Payer: Medicare Other | Admitting: Student

## 2022-06-16 DIAGNOSIS — F419 Anxiety disorder, unspecified: Secondary | ICD-10-CM

## 2022-06-16 DIAGNOSIS — Z978 Presence of other specified devices: Secondary | ICD-10-CM

## 2022-06-16 DIAGNOSIS — R112 Nausea with vomiting, unspecified: Secondary | ICD-10-CM | POA: Diagnosis not present

## 2022-06-16 DIAGNOSIS — D638 Anemia in other chronic diseases classified elsewhere: Secondary | ICD-10-CM

## 2022-06-16 DIAGNOSIS — N390 Urinary tract infection, site not specified: Secondary | ICD-10-CM

## 2022-06-16 DIAGNOSIS — I10 Essential (primary) hypertension: Secondary | ICD-10-CM

## 2022-06-16 DIAGNOSIS — K279 Peptic ulcer, site unspecified, unspecified as acute or chronic, without hemorrhage or perforation: Secondary | ICD-10-CM | POA: Diagnosis not present

## 2022-06-16 DIAGNOSIS — E440 Moderate protein-calorie malnutrition: Secondary | ICD-10-CM

## 2022-06-16 DIAGNOSIS — I69319 Unspecified symptoms and signs involving cognitive functions following cerebral infarction: Secondary | ICD-10-CM

## 2022-06-16 NOTE — Progress Notes (Unsigned)
Provider:  Dr. Earnestine Mealing Location:  Other [facility].  Nursing Home Room Number: [facility] 116A Place of Service:  SNF (31)  PCP: Danella Penton, MD Patient Care Team: Danella Penton, MD as PCP - General (Internal Medicine) Mariah Milling Tollie Pizza, MD as Consulting Physician (Cardiology)  Extended Emergency Contact Information Primary Emergency Contact: Egnew,james Sr. Home Phone: [phone] Mobile Phone: [phone] Relation: Spouse Secondary Emergency Contact: Asa Lente Home Phone: [phone] Mobile Phone: [phone] Relation: Son  Code Status: DNR Goals of Care: Advanced Directive information    06/16/2022    8:59 AM  Advanced Directives  Does Patient Have a Medical Advance Directive? Yes  Type of Advance Directive Out of facility DNR (pink MOST or yellow form)  Does patient want to make changes to medical advance directive? No - Patient declined      Chief Complaint  Patient presents with   New Admit To SNF    Admission.     HPI: Patient is a 89 y.o. female seen today for admission to  Spoke with son - patient's son -- vanmaanen Short term memory is very poor. Physical therapy she hasn't progressed at this time. She is in a flat position during therapy and when she moves to 90 degrees-- she has dizziness immediately  and she doesn't want to do anything. She feels nauseated as well  To his understadning the fall happened late at night. Disoriented, thought she could do it herself.   When family is around her spirits are good  She continues to have anxiety when no one is around. Wondering if something could hlep with her anxiety.   Before the stroke she had multiple falls before the stroke. Unclear what the causes of the falls were she fall without bracing onto cement. She was in the hospital in 2021 -- that was the beginning of her decline from his prospecitve. 4  Past Medical History:  Diagnosis Date   Actinic keratosis    Breast cancer     C1 cervical fracture    Hypertension    Osteoarthritis    Past Surgical History:  Procedure Laterality Date   BACK SURGERY     BIOPSY  05/03/2022   Procedure: BIOPSY;  Surgeon: Benancio Deeds, MD;  Location: [facility] ENDOSCOPY;  Service: Gastroenterology;;   ESOPHAGOGASTRODUODENOSCOPY (EGD) WITH PROPOFOL N/A 05/03/2022   Procedure: ESOPHAGOGASTRODUODENOSCOPY (EGD) WITH PROPOFOL;  Surgeon: Benancio Deeds, MD;  Location: [facility] ENDOSCOPY;  Service: Gastroenterology;  Laterality: N/A;   HEMOSTASIS CONTROL  05/03/2022   Procedure: HEMOSTASIS CONTROL;  Surgeon: Benancio Deeds, MD;  Location: [facility] ENDOSCOPY;  Service: Gastroenterology;;   IR INJECT/THERA/INC NEEDLE/CATH/PLC EPI/LUMB/SAC [facility]  04/29/2021   MOHS SURGERY      reports that she has never smoked. She has never been exposed to tobacco smoke. She has never used smokeless tobacco. She reports current alcohol use. She reports that she does not use drugs. Social History   Socioeconomic History   Marital status: Married    Spouse name: Not on file   Number of children: Not on file   Years of education: Not on file   Highest education level: Not on file  Occupational History   Not on file  Tobacco Use   Smoking status: Never    Passive exposure: Never   Smokeless tobacco: Never  Vaping Use   Vaping Use: Never used  Substance and Sexual Activity   Alcohol use: Yes    Comment: Occassionally   Drug use: No  Sexual activity: Not on file  Other Topics Concern   Not on file  Social History Narrative   Not on file   Social Determinants of Health   Financial Resource Strain: Not on file  Food Insecurity: No Food Insecurity (05/15/2022)   Hunger Vital Sign    Worried About Running Out of Food in the Last Year: Never true    Ran Out of Food in the Last Year: Never true  Transportation Needs: No Transportation Needs (05/15/2022)   PRAPARE - Administrator, Civil Service (Medical): No    Lack of Transportation  (Non-Medical): No  Physical Activity: Not on file  Stress: Not on file  Social Connections: Not on file  Intimate Partner Violence: Not At Risk (05/15/2022)   Humiliation, Afraid, Rape, and Kick questionnaire    Fear of Current or Ex-Partner: No    Emotionally Abused: No    Physically Abused: No    Sexually Abused: No    Functional Status Survey:    Family History  Problem Relation Age of Onset   Hypertension Father     Health Maintenance  Topic Date Due   Medicare Annual Wellness (AWV)  Never done   DEXA SCAN  Never done   Pneumonia Vaccine 27+ Years old (2 of 2 - PPSV23 or PCV20) 01/05/2018   COVID-19 Vaccine (4 - 2023-24 season) 10/30/2021   INFLUENZA VACCINE  09/30/2022   DTaP/Tdap/Td (3 - Td or Tdap) 07/15/2029   Zoster Vaccines- Shingrix  Completed   HPV VACCINES  Aged Out    Allergies  Allergen Reactions   Morphine     Other reaction(s): Other (See Comments) Apnea with large dose Respiratory Distress    Buprenorphine Hcl     Other reaction(s): Other (See Comments), Other (See Comments) Respiratory Distress Respiratory Distress    Codeine    Epinephrine     Other reaction(s): Other (See Comments) Severe anxiety; "makes my heart race" - would like to avoid during surgery   Morphine And Related Other (See Comments)    Respiratory Distress    Percocet [Oxycodone-Acetaminophen]     Outpatient Encounter Medications as of 06/16/2022  Medication Sig   acetaminophen (TYLENOL) 325 MG tablet Take 2 tablets (650 mg total) by mouth every 6 (six) hours as needed for headache.   amLODipine (NORVASC) 5 MG tablet Take 1 tablet (5 mg total) by mouth daily.   aspirin EC 81 MG tablet Take 1 tablet (81 mg total) by mouth daily. Swallow whole.   atorvastatin (LIPITOR) 40 MG tablet Take 1 tablet (40 mg total) by mouth daily.   Baclofen 5 MG TABS Take 1 tablet (5 mg total) by mouth 2 (two) times daily as needed (HEADACHES AND HICCUPS).   cholecalciferol (VITAMIN D) 25 MCG  (1000 UNIT) tablet Take 2,000 Units by mouth daily.   Cyanocobalamin (B-12) 1000 MCG CAPS Take 2,000 mcg by mouth daily.   haloperidol (HALDOL) 1 MG tablet Take 1 tablet (1 mg total) by mouth every 6 (six) hours as needed (nausea vomiting.).   labetalol (NORMODYNE) 100 MG tablet Take 100 mg by mouth daily.   lactose free nutrition (BOOST) LIQD Take 237 mLs by mouth 3 (three) times daily between meals.   loperamide (IMODIUM A-D) 2 MG tablet Take 1 tablet (2 mg total) by mouth 4 (four) times daily as needed for diarrhea or loose stools.   ondansetron (ZOFRAN) 4 MG tablet Take one tablet by mouth every 6 hours as needed.   pantoprazole (  PROTONIX) 40 MG tablet Take 1 tablet (40 mg total) by mouth 2 (two) times daily for 21 days, THEN 1 tablet (40 mg total) daily.   polyethylene glycol powder (GLYCOLAX/MIRALAX) 17 GM/SCOOP powder Take 17 g by mouth every 12 (twelve) hours as needed for mild constipation.   promethazine (PHENERGAN) 12.5 MG tablet Take 1 tablet (12.5 mg total) by mouth every 8 (eight) hours as needed for nausea or vomiting.   tamsulosin (FLOMAX) 0.4 MG CAPS capsule Take 1 capsule (0.4 mg total) by mouth daily.   No facility-administered encounter medications on file as of 06/16/2022.    Review of Systems  Vitals:   06/16/22 0853 06/16/22 0903  BP: (!) 187/77 (!) 112/59  Pulse: 75   Resp: 18   Temp: 97.8 F (36.6 C)   SpO2: 99%   Weight: 118 lb (53.5 kg)   Height:  (1.575 m)    Body mass index is 21.58 kg/m. Physical Exam  Labs reviewed: Basic Metabolic Panel: Recent Labs    04/26/22 0320 05/01/22 1251 05/07/22 1052 05/08/22 0425 05/10/22 0657 05/19/22 1028 05/27/22 0000 05/31/22 0000 06/08/22 0140 06/11/22 0949  NA 133*   < > 134* 135   < > 138   < > 140 137 137  K 3.5   < > 3.5 3.8   < > 3.7   < > 3.9 4.0 3.3*  CL 97*   < > 100 103   < > 103   < > 107 103 105  CO2 22   < > 23 23   < > 23   < > 24* 23 21*  GLUCOSE 131*   < > 142* 100*   < > 152*  --    --  135* 141*  BUN 19   < > 13 10   < > 25*   < > CREATININE 0.64   < > 0.72 0.54   < > 0.49   < > 0.5 0.62 0.52  CALCIUM 8.9   < > 8.5* 8.5*   < > 9.2   < > 8.9 9.5 9.2  MG 1.4*   < > 1.9 1.8  --   --   --   --   --  1.2*  PHOS 3.0  --   --   --   --   --   --   --   --   --    < > = values in this interval not displayed.   Liver Function Tests: Recent Labs    01/08/22 0327 04/25/22 0948 05/15/22 1404  AST ALT ALKPHOS 52 43 62  BILITOT 0.4 0.9 0.5  PROT 5.2* 7.0 6.5  ALBUMIN 2.7* 4.1 3.0*   Recent Labs    04/25/22 0948  LIPASE 43   No results for input(s): "AMMONIA" in the last 8760 hours. CBC: Recent Labs    05/27/22 0000 05/31/22 0000 06/08/22 0140 06/09/22 0443 06/10/22 0440 06/11/22 0949  WBC 7.5 6.9 12.0* 7.1  --  8.1  NEUTROABS 4,755.00 3,374.00 8.2*  --   --   --   HGB 11.1* 10.2* 12.0 9.5* 9.9* 10.4*  HCT 34* 32* 38.0 29.8* 30.9* 32.3*  MCV  --   --  89.2 90.3  --  87.8  PLT 426* 386 382 314  --  322   Cardiac Enzymes: No results for input(s): "CKTOTAL", "CKMB", "CKMBINDEX", "TROPONINI" in the last 8760  hours. BNP: Invalid input(s): "POCBNP" Lab Results  Component Value Date   HGBA1C 5.6 04/26/2022   No results found for: "TSH" No results found for: "VITAMINB12" No results found for: "FOLATE" Lab Results  Component Value Date   IRON 46 05/16/2021   TIBC 319 05/16/2021   FERRITIN 168 05/16/2021    Imaging and Procedures obtained prior to SNF admission: DG Foot 2 Views Left  Result Date: 06/08/2022 CLINICAL DATA:  Fall with abrasion to the left lateral foot and ankle associated with swelling EXAM: LEFT FOOT - 2 VIEW; LEFT ANKLE - 2 VIEW COMPARISON:  Left ankle radiograph dated 04/26/2021 FINDINGS: There are no findings of fracture or dislocation. No joint effusion. Degenerative changes of the foot. Ankle mortise is intact. Soft tissues are unremarkable. IMPRESSION: No acute fracture or dislocation. Electronically  Signed   By: Agustin Cree M.D.   On: 06/08/2022 10:34   DG Ankle 2 Views Left  Result Date: 06/08/2022 CLINICAL DATA:  Fall with abrasion to the left lateral foot and ankle associated with swelling EXAM: LEFT FOOT - 2 VIEW; LEFT ANKLE - 2 VIEW COMPARISON:  Left ankle radiograph dated 04/26/2021 FINDINGS: There are no findings of fracture or dislocation. No joint effusion. Degenerative changes of the foot. Ankle mortise is intact. Soft tissues are unremarkable. IMPRESSION: No acute fracture or dislocation. Electronically Signed   By: Agustin Cree M.D.   On: 06/08/2022 10:34   CT HEAD WO CONTRAST ( )  Result Date: 06/08/2022 CLINICAL DATA:  Unwitnessed fall. Large hematoma above the right eyebrow. Vomiting in triage. EXAM: CT HEAD WITHOUT CONTRAST CT CERVICAL SPINE WITHOUT CONTRAST TECHNIQUE: Multidetector CT imaging of the head and cervical spine was performed following the standard protocol without intravenous contrast. Multiplanar CT image reconstructions of the cervical spine were also generated. RADIATION DOSE REDUCTION: This exam was performed according to the departmental dose-optimization program which includes automated exposure control, adjustment of the mA and/or kV according to patient size and/or use of iterative reconstruction technique. COMPARISON:  MRI head 05/18/2022 and MRI cervical spine 12/30/2021; CT head and cervical spine 12/30/2021 FINDINGS: CT HEAD FINDINGS Brain: No intracranial hemorrhage, mass effect, or evidence of acute infarct. No hydrocephalus. No extra-axial fluid collection. Generalized cerebral atrophy. Ill-defined hypoattenuation within the cerebral white matter is nonspecific but consistent with chronic small vessel ischemic disease. Chronic left cerebellar and right frontal lobe infarcts. Chronic lacunar infarct left basal ganglia. Vascular: No hyperdense vessel. Intracranial arterial calcification. Skull: Large hematoma about the right forehead. No underlying calvarial  fracture. Sinuses/Orbits: No acute finding. Mild mucosal thickening left sphenoid sinus. Opacification of a few right mastoid air cells. Other: None. CT CERVICAL SPINE FINDINGS Alignment: No evidence of traumatic malalignment. Unchanged multilevel grade 1 anterolisthesis of C2, C3, C4 and C7. Skull base and vertebrae: No acute fracture. Redemonstrated type 2 dens fracture and mildly displaced bilateral posterior arch fractures of C1. The dens fracture appears slightly more distracted and angulated posteriorly. No evidence of spinal canal narrowing. No acute fracture. Soft tissues and spinal canal: No prevertebral fluid or swelling. No visible canal hematoma. Disc levels: Advanced multilevel spondylosis, disc space height loss, degenerative endplate changes greatest at C5-C6 and C6-C7. Multilevel advanced facet arthropathy. Posterior disc osteophyte complexes C5-C6 and C6-C7 cause mild-moderate effacement of the ventral thecal sac. Uncovertebral spurring and facet arthropathy cause advanced multilevel neural foraminal narrowing. Upper chest: No acute abnormality. Other: None. IMPRESSION: 1. No acute intracranial abnormality. 2. Large scalp hematoma about the right forehead. No calvarial fracture. 3.  No acute fracture in the cervical spine. Redemonstrated type 2 dens fracture and bilateral posterior arch of C1 fractures. Electronically Signed   By: Minerva Fester M.D.   On: 06/08/2022 02:06   CT Cervical Spine Wo Contrast  Result Date: 06/08/2022 CLINICAL DATA:  Unwitnessed fall. Large hematoma above the right eyebrow. Vomiting in triage. EXAM: CT HEAD WITHOUT CONTRAST CT CERVICAL SPINE WITHOUT CONTRAST TECHNIQUE: Multidetector CT imaging of the head and cervical spine was performed following the standard protocol without intravenous contrast. Multiplanar CT image reconstructions of the cervical spine were also generated. RADIATION DOSE REDUCTION: This exam was performed according to the departmental  dose-optimization program which includes automated exposure control, adjustment of the mA and/or kV according to patient size and/or use of iterative reconstruction technique. COMPARISON:  MRI head 05/18/2022 and MRI cervical spine 12/30/2021; CT head and cervical spine 12/30/2021 FINDINGS: CT HEAD FINDINGS Brain: No intracranial hemorrhage, mass effect, or evidence of acute infarct. No hydrocephalus. No extra-axial fluid collection. Generalized cerebral atrophy. Ill-defined hypoattenuation within the cerebral white matter is nonspecific but consistent with chronic small vessel ischemic disease. Chronic left cerebellar and right frontal lobe infarcts. Chronic lacunar infarct left basal ganglia. Vascular: No hyperdense vessel. Intracranial arterial calcification. Skull: Large hematoma about the right forehead. No underlying calvarial fracture. Sinuses/Orbits: No acute finding. Mild mucosal thickening left sphenoid sinus. Opacification of a few right mastoid air cells. Other: None. CT CERVICAL SPINE FINDINGS Alignment: No evidence of traumatic malalignment. Unchanged multilevel grade 1 anterolisthesis of C2, C3, C4 and C7. Skull base and vertebrae: No acute fracture. Redemonstrated type 2 dens fracture and mildly displaced bilateral posterior arch fractures of C1. The dens fracture appears slightly more distracted and angulated posteriorly. No evidence of spinal canal narrowing. No acute fracture. Soft tissues and spinal canal: No prevertebral fluid or swelling. No visible canal hematoma. Disc levels: Advanced multilevel spondylosis, disc space height loss, degenerative endplate changes greatest at C5-C6 and C6-C7. Multilevel advanced facet arthropathy. Posterior disc osteophyte complexes C5-C6 and C6-C7 cause mild-moderate effacement of the ventral thecal sac. Uncovertebral spurring and facet arthropathy cause advanced multilevel neural foraminal narrowing. Upper chest: No acute abnormality. Other: None. IMPRESSION:  1. No acute intracranial abnormality. 2. Large scalp hematoma about the right forehead. No calvarial fracture. 3. No acute fracture in the cervical spine. Redemonstrated type 2 dens fracture and bilateral posterior arch of C1 fractures. Electronically Signed   By: Minerva Fester M.D.   On: 06/08/2022 02:06    Assessment/Plan There are no diagnoses linked to this encounter.   Family/ staff Communication:   Labs/tests ordered:

## 2022-06-17 DIAGNOSIS — I69319 Unspecified symptoms and signs involving cognitive functions following cerebral infarction: Secondary | ICD-10-CM | POA: Insufficient documentation

## 2022-06-19 ENCOUNTER — Other Ambulatory Visit: Payer: Self-pay

## 2022-06-19 ENCOUNTER — Emergency Department: Payer: Medicare Other

## 2022-06-19 ENCOUNTER — Emergency Department
Admission: EM | Admit: 2022-06-19 | Discharge: 2022-06-19 | Disposition: A | Discharge status: Skilled Nursing Facility | Payer: Medicare Other | Attending: Emergency Medicine | Admitting: Emergency Medicine

## 2022-06-19 DIAGNOSIS — S0990XA Unspecified injury of head, initial encounter: Secondary | ICD-10-CM | POA: Diagnosis present

## 2022-06-19 DIAGNOSIS — Y92121 Bathroom in nursing home as the place of occurrence of the external cause: Secondary | ICD-10-CM | POA: Insufficient documentation

## 2022-06-19 DIAGNOSIS — S0181XA Laceration without foreign body of other part of head, initial encounter: Secondary | ICD-10-CM | POA: Insufficient documentation

## 2022-06-19 DIAGNOSIS — W01198A Fall on same level from slipping, tripping and stumbling with subsequent striking against other object, initial encounter: Secondary | ICD-10-CM | POA: Insufficient documentation

## 2022-06-19 DIAGNOSIS — W19XXXA Unspecified fall, initial encounter: Secondary | ICD-10-CM

## 2022-06-19 NOTE — ED Provider Notes (Signed)
[facility] Provider Note    Event Date/Time   First MD Initiated Contact with Patient 06/19/22 [phone]     (approximate)   History   Fall   HPI  Sherry [name] is a 89 y.o. female who presents to the ED for evaluation of Fall   I reviewed DC summary from 4/12.  Admitted after a fall with a UTI associated with a Foley catheter.  Right forehead hematoma from this fall on previous admission.  No anticoagulation.  Patient returns to the ED via EMS from her SNF for evaluation of a fall.  Unwitnessed fall in her bathroom and struck the other side of her forehead, the left side,.  No reported seizure activity.  Unknown syncope.  Here in the ED, she is pleasantly disoriented and has no complaints.  Denies any pain.   Physical Exam   Triage Vital Signs: ED Triage Vitals  Enc Vitals Group     BP      Pulse      Resp      Temp      Temp src      SpO2      Weight      Height      Head Circumference      Peak Flow      Pain Score      Pain Loc      Pain Edu?      Excl. in GC?     Most recent vital signs: Vitals:   06/19/22 0529 06/19/22 0600  BP: (!) 180/70 (!) 169/66  Pulse: 73 66  Resp: 16 18  Temp: 97.8 F (36.6 C)   SpO2: 100% 98%    General: Awake, no distress.  CV:  Good peripheral perfusion.  Resp:  Normal effort.  Abd:  No distention.  MSK:  No deformity noted.  Neuro:  No focal deficits appreciated. Other:  Old right forehead hematoma.  Left-sided forehead with a small, 1.5 cm laceration above the brow that is well-approximated, dried blood around it, no active bleeding.   ED Results / Procedures / Treatments   Labs (all labs ordered are listed, but only abnormal results are displayed) Labs Reviewed - No data to display  EKG   RADIOLOGY CT head interpreted by me without evidence of acute intracranial pathology CT cervical spine interpreted by me without evidence of fracture or dislocation  Official radiology  report(s): CT Cervical Spine Wo Contrast  Result Date: 06/19/2022 CLINICAL DATA:  89 year old female status post fall. Chronic ununited type 2 odontoid fracture. EXAM: CT CERVICAL SPINE WITHOUT CONTRAST TECHNIQUE: Multidetector CT imaging of the cervical spine was performed without intravenous contrast. Multiplanar CT image reconstructions were also generated. RADIATION DOSE REDUCTION: This exam was performed according to the departmental dose-optimization program which includes automated exposure control, adjustment of the mA and/or kV according to patient size and/or use of iterative reconstruction technique. COMPARISON:  Head CT today. Recent brain MRI, CTA head and neck. Cervical spine CT 06/08/2022. FINDINGS: Alignment: Stable. Multilevel degenerative appearing mild spondylolisthesis including C2-C3, C4-C5, C7-T1, also T1-T2. Bilateral posterior element alignment is within normal limits. Skull base and vertebrae: No acute osseous abnormality identified. Chronic unhealed bilateral lateral C1 ring fractures, type 2 odontoid fracture. Stable craniocervical alignment. Underlying widespread chronic cervical facet ankylosis. Soft tissues and spinal canal: No prevertebral fluid or swelling. No visible canal hematoma. Stable noncontrast visible neck soft tissues. Disc levels:  Stable. Upper chest: Chronic T1 mild anterior wedge  compression fracture appears stable. Noncontrast visible upper chest is stable. IMPRESSION: 1. No acute traumatic injury identified in the cervical spine. 2. Chronic unhealed C1 ring and type 2 odontoid fractures. Stable craniocervical and cervical vertebral alignment. Chronic cervical facet ankylosis. 3. Chronic T1 mild anterior wedge compression fracture. Electronically Signed   By: Odessa Fleming M.D.   On: 06/19/2022 06:19   CT HEAD WO CONTRAST ( )  Result Date: 06/19/2022 CLINICAL DATA:  89 year old female status post fall. EXAM: CT HEAD WITHOUT CONTRAST TECHNIQUE: Contiguous axial images  were obtained from the base of the skull through the vertex without intravenous contrast. RADIATION DOSE REDUCTION: This exam was performed according to the departmental dose-optimization program which includes automated exposure control, adjustment of the mA and/or kV according to patient size and/or use of iterative reconstruction technique. COMPARISON:  Recent Brain MRI 06/10/2022. CTA head and neck 06/10/2022. And earlier. FINDINGS: Brain: Multifocal chronic encephalomalacia, cerebral white matter disease described recently by MRI is stable. No midline shift, ventriculomegaly, mass effect, evidence of mass lesion, intracranial hemorrhage or evidence of cortically based acute infarction. Vascular: Calcified atherosclerosis at the skull base. No suspicious intracranial vascular hyperdensity. Skull: Stable.  No acute osseous abnormality identified. Sinuses/Orbits: Unchanged left sphenoid sinus mucosal thickening, right mastoid effusion. Other paranasal sinuses remain well aerated. Other: Unresolved broad-based right forehead, supraorbital scalp hematoma. Smaller new contralateral left anterior convexity scalp hematoma on series 3, image 48. No underlying calvarium fracture. IMPRESSION: 1. Small new left convexity scalp hematoma. Unresolved larger right forehead, supraorbital scalp hematoma. No underlying calvarium fracture. 2. Stable non contrast CT appearance of the brain. No acute intracranial abnormality. Electronically Signed   By: Odessa Fleming M.D.   On: 06/19/2022 06:16    PROCEDURES and INTERVENTIONS:  .Marland KitchenLaceration Repair  Date/Time: 06/19/2022 6:25 AM  Performed by: Delton Prairie, MD Authorized by: Delton Prairie, MD   Consent:    Consent obtained:  Verbal   Consent given by:  Patient   Risks, benefits, and alternatives were discussed: yes   Laceration details:    Length (cm):  2 Exploration:    Limited defect created (wound extended): no     Hemostasis achieved with:  Direct pressure   Imaging  obtained comment:  CT   Imaging outcome: foreign body not noted     Contaminated: no   Treatment:    Area cleansed with:  Povidone-iodine   Amount of cleaning:  Standard   Irrigation solution:  Sterile saline Skin repair:    Repair method:  Tissue adhesive Approximation:    Approximation:  Close Repair type:    Repair type:  Simple Post-procedure details:    Dressing:  Open (no dressing)   Procedure completion:  Tolerated well, no immediate complications   Medications - No data to display   IMPRESSION / MDM / ASSESSMENT AND PLAN / ED COURSE  I reviewed the triage vital signs and the nursing notes.  Differential diagnosis includes, but is not limited to, open globe, retrobulbar hematoma, ICH, skull fracture, seizure  {Patient presents with symptoms of an acute illness or injury that is potentially life-threatening.  Pleasantly disoriented patient presents after another fall with a small forehead laceration suitable for Dermabond repair and return to her facility.  Reassuring exam without signs of acute deficits.  Small lack of the left brow repaired, as above.  Imaging is reassuring of the head and neck.  She has no complaints and reassuring exam.  Will return her to her facility  Clinical Course as  of 06/19/22 1610  Sat Jun 19, 2022  0623 Laceration cleaned and tissue adhesive applied by me, well-tolerated [DS]    Clinical Course User Index [DS] Delton Prairie, MD     FINAL CLINICAL IMPRESSION(S) / ED DIAGNOSES   Final diagnoses:  Fall, initial encounter  Injury of head, initial encounter  Forehead laceration, initial encounter     Rx / DC Orders   ED Discharge Orders     None        Note:  This document was prepared using Dragon voice recognition software and may include unintentional dictation errors.   Delton Prairie, MD 06/19/22 [phone]

## 2022-06-19 NOTE — ED Notes (Signed)
ED Provider at bedside. 

## 2022-06-19 NOTE — Discharge Instructions (Signed)
Dermabond/tissue adhesive applied to the small laceration over her left brow.  Imaging of the head and neck were reassuring.

## 2022-06-19 NOTE — ED Notes (Signed)
[facility] called for transport to [facility]  

## 2022-06-19 NOTE — ED Notes (Signed)
Patient transported to CT 

## 2022-06-19 NOTE — ED Triage Notes (Signed)
Per EMS pt fell and it was unwitnessed. Pt has a dressing in place on her R  forehead area from 06/08/22 fall. Pt has new bruising to the L side of her forehead. MD at bedside. No blood thinners noted. Pt is aaox3 at this time.

## 2022-06-21 LAB — COMPREHENSIVE METABOLIC PANEL
Albumin: 3.4 — AB (ref 3.5–5.0)
Calcium: 9.7 (ref 8.7–10.7)
Globulin: 2.1

## 2022-06-21 LAB — CBC AND DIFFERENTIAL
HCT: 32 — AB (ref 36–46)
Hemoglobin: 10.4 — AB (ref 12.0–16.0)
Neutrophils Absolute: 6348
Platelets: 357 10*3/uL (ref 150–400)
WBC: 9.2

## 2022-06-21 LAB — BASIC METABOLIC PANEL
BUN: 21 (ref 4–21)
CO2: 26 — AB (ref 13–22)
Chloride: 100 (ref 99–108)
Creatinine: 0.7 (ref 0.5–1.1)
Glucose: 168
Potassium: 4 mEq/L (ref 3.5–5.1)
Sodium: 137 (ref 137–147)

## 2022-06-21 LAB — HEPATIC FUNCTION PANEL
ALT: 12 U/L (ref 7–35)
AST: 13 (ref 13–35)
Alkaline Phosphatase: 63 (ref 25–125)
Bilirubin, Total: 0.4

## 2022-06-21 LAB — CBC: RBC: 3.72 — AB (ref 3.87–5.11)

## 2022-06-25 ENCOUNTER — Encounter: Payer: Self-pay | Admitting: Student

## 2022-06-25 ENCOUNTER — Non-Acute Institutional Stay (SKILLED_NURSING_FACILITY): Payer: Medicare Other | Admitting: Student

## 2022-06-25 DIAGNOSIS — E440 Moderate protein-calorie malnutrition: Secondary | ICD-10-CM | POA: Diagnosis not present

## 2022-06-25 DIAGNOSIS — I69319 Unspecified symptoms and signs involving cognitive functions following cerebral infarction: Secondary | ICD-10-CM

## 2022-06-25 DIAGNOSIS — R296 Repeated falls: Secondary | ICD-10-CM | POA: Diagnosis not present

## 2022-06-25 DIAGNOSIS — Z66 Do not resuscitate: Secondary | ICD-10-CM | POA: Diagnosis not present

## 2022-06-25 DIAGNOSIS — R34 Anuria and oliguria: Secondary | ICD-10-CM

## 2022-06-25 DIAGNOSIS — R5381 Other malaise: Secondary | ICD-10-CM

## 2022-06-25 DIAGNOSIS — I10 Essential (primary) hypertension: Secondary | ICD-10-CM

## 2022-06-25 DIAGNOSIS — I69398 Other sequelae of cerebral infarction: Secondary | ICD-10-CM

## 2022-06-25 DIAGNOSIS — R42 Dizziness and giddiness: Secondary | ICD-10-CM

## 2022-06-25 NOTE — Progress Notes (Signed)
Location:  Other [facility]) Nursing Home Room Number: 116-A Place of Service:  SNF [phone]) Provider:  Coralyn Helling, MD  Patient Care Team: Danella Penton, MD as PCP - General (Internal Medicine) Antonieta Iba, MD as Consulting Physician (Cardiology)  Extended Emergency Contact Information Primary Emergency Contact: Venuto,james Sr. Home Phone: [phone] Mobile Phone: [phone] Relation: Spouse Secondary Emergency Contact: Asa Lente Home Phone: [phone] Mobile Phone: [phone] Relation: Son  Code Status:  DNR Goals of care: Advanced Directive information    06/25/2022   10:22 AM  Advanced Directives  Does Patient Have a Medical Advance Directive? Yes  Type of Advance Directive Out of facility DNR (pink MOST or yellow form)  Does patient want to make changes to medical advance directive? No - Patient declined  Pre-existing out of facility DNR order (yellow form or pink MOST form) Pink MOST form placed in chart (order not valid for inpatient use)     Chief Complaint  Patient presents with   Acute Visit    ED follow-up     HPI:  Pt is a 89 y.o. female seen today for an acute visit for follow up after emergency room visit for a fall. Patient was sitting on the bathroom, fell and hit her head. She doesn't remember the fall. She states she is in a rehab center in [place], [place]. Her husband states that was her home town. She gives her birth date and the year accurately.   Her husband is at bedside and he is also concerned about how she is doing and worries what can be done for her.   She says she would like to get better. Discussed concern that she has desire, however, her body has had trouble maintaining weight as well.   Nursing states patient pulled her foley catheter during a moment of forgetfulness. She had poor urine output for 16 hours, and nursing performed and I&O catheterizaiton for 250 mL. She has urinated  since that time.    Attempted to call son, LVM. Plans to be here on Monday.    Past Medical History:  Diagnosis Date   Actinic keratosis    Breast cancer (HCC)    C1 cervical fracture (HCC)    Hypertension    Osteoarthritis    Past Surgical History:  Procedure Laterality Date   BACK SURGERY     BIOPSY  05/03/2022   Procedure: BIOPSY;  Surgeon: Benancio Deeds, MD;  Location: [facility] ENDOSCOPY;  Service: Gastroenterology;;   ESOPHAGOGASTRODUODENOSCOPY (EGD) WITH PROPOFOL N/A 05/03/2022   Procedure: ESOPHAGOGASTRODUODENOSCOPY (EGD) WITH PROPOFOL;  Surgeon: Benancio Deeds, MD;  Location: [facility] ENDOSCOPY;  Service: Gastroenterology;  Laterality: N/A;   HEMOSTASIS CONTROL  05/03/2022   Procedure: HEMOSTASIS CONTROL;  Surgeon: Benancio Deeds, MD;  Location: [facility] ENDOSCOPY;  Service: Gastroenterology;;   IR INJECT/THERA/INC NEEDLE/CATH/PLC EPI/LUMB/SAC [facility]  04/29/2021   MOHS SURGERY      Allergies  Allergen Reactions   Morphine     Other reaction(s): Other (See Comments) Apnea with large dose Respiratory Distress    Buprenorphine Hcl     Other reaction(s): Other (See Comments), Other (See Comments) Respiratory Distress Respiratory Distress    Codeine    Epinephrine     Other reaction(s): Other (See Comments) Severe anxiety; "makes my heart race" - would like to avoid during surgery   Morphine And Related Other (See Comments)    Respiratory Distress    Percocet [Oxycodone-Acetaminophen]     Outpatient Encounter Medications as of 06/25/2022  Medication Sig   acetaminophen (TYLENOL) 325 MG tablet Take 2 tablets (650 mg total) by mouth every 6 (six) hours as needed for headache.   amLODipine (NORVASC) 5 MG tablet Take 1 tablet (5 mg total) by mouth daily.   aspirin EC 81 MG tablet Take 1 tablet (81 mg total) by mouth daily. Swallow whole.   atorvastatin (LIPITOR) 40 MG tablet Take 1 tablet (40 mg total) by mouth daily.   Baclofen 5 MG TABS Take 1 tablet (5 mg total) by mouth 2 (two) times  daily as needed (HEADACHES AND HICCUPS).   cholecalciferol (VITAMIN D) 25 MCG (1000 UNIT) tablet Take 2,000 Units by mouth daily.   Cyanocobalamin (B-12) 1000 MCG CAPS Take 2,000 mcg by mouth daily.   labetalol (NORMODYNE) 100 MG tablet Take 100 mg by mouth daily.   lactose free nutrition (BOOST) LIQD Take 237 mLs by mouth 3 (three) times daily between meals.   loperamide (IMODIUM A-D) 2 MG tablet Take 1 tablet (2 mg total) by mouth 4 (four) times daily as needed for diarrhea or loose stools.   ondansetron (ZOFRAN) 4 MG tablet Take one tablet by mouth every 6 hours as needed.   pantoprazole (PROTONIX) 40 MG tablet Take 1 tablet (40 mg total) by mouth 2 (two) times daily for 21 days, THEN 1 tablet (40 mg total) daily.   polyethylene glycol powder (GLYCOLAX/MIRALAX) 17 GM/SCOOP powder Take 17 g by mouth every 12 (twelve) hours as needed for mild constipation. And once daily in the morning scheduled   promethazine (PHENERGAN) 12.5 MG tablet Take 1 tablet (12.5 mg total) by mouth every 8 (eight) hours as needed for nausea or vomiting.   senna (SENOKOT) 8.6 MG TABS tablet Take 2 tablets by mouth at bedtime.   sertraline (ZOLOFT) 50 MG tablet Take 50 mg by mouth daily.   [DISCONTINUED] tamsulosin (FLOMAX) 0.4 MG CAPS capsule Take 1 capsule (0.4 mg total) by mouth daily.   [DISCONTINUED] haloperidol (HALDOL) 1 MG tablet Take 1 tablet (1 mg total) by mouth every 6 (six) hours as needed (nausea vomiting.).   No facility-administered encounter medications on file as of 06/25/2022.    Review of Systems  Immunization History  Administered Date(s) Administered   Influenza Inj Mdck Quad Pf 11/30/2021   Influenza, High Dose Seasonal PF 12/16/2020   Influenza-Unspecified 12/06/2011, 11/27/2012, 01/19/2014, 12/11/2014, 12/03/2015, 12/10/2016, 12/18/2019   PFIZER Comirnaty(Gray Top)Covid-19 Tri-Sucrose Vaccine 03/13/2019, 04/03/2019, 12/05/2019   Pneumococcal Conjugate-13 12/31/2016, 01/05/2017   Td  (Adult),5 Lf Tetanus Toxid, Preservative Free 07/16/2019   Tdap 06/18/2011   Zoster Recombinat (Shingrix) 06/30/2016, 10/26/2017, 01/06/2018   Pertinent  Health Maintenance Due  Topic Date Due   DEXA SCAN  Never done   INFLUENZA VACCINE  09/30/2022      01/08/2022    9:00 AM 01/08/2022    7:58 PM 01/09/2022    9:00 AM 01/09/2022    8:25 PM 01/10/2022    8:50 AM  Fall Risk  (RETIRED) Patient Fall Risk Level High fall risk Moderate fall risk High fall risk High fall risk High fall risk   Functional Status Survey:    Vitals:   06/25/22 1020  BP: (!) 146/63  Pulse: 74  Temp: 97.9 F (36.6 C)  SpO2: 96%  Weight: 117 lb 12.8 oz (53.4 kg)  Height: 5\' 2"  (1.575 m)   Body mass index is 21.55 kg/m. Physical Exam Vitals reviewed.  Cardiovascular:     Rate and Rhythm: Normal rate.     Pulses:  Normal pulses.     Heart sounds: Normal heart sounds.  Pulmonary:     Effort: Pulmonary effort is normal.  Skin:    Comments: Large, healing hematoma of the right forehead. Left forehead bruise yellow edges, reflective of healing.   Neurological:     Mental Status: She is alert.     Comments: Oriented x2     Labs reviewed: Recent Labs    04/26/22 0320 05/01/22 1251 05/07/22 1052 05/08/22 0425 05/10/22 0657 05/19/22 1028 05/27/22 0000 06/08/22 0140 06/11/22 0949 06/21/22 0000  NA 133*   < > 134* 135   < > 138   < > 137 137 137  K 3.5   < > 3.5 3.8   < > 3.7   < > 4.0 3.3* 4.0  CL 97*   < > 100 103   < > 103   < > 103 105 100  CO2 22   < > 23 23   < > 23   < > 23 21* 26*  GLUCOSE 131*   < > 142* 100*   < > 152*  --  135* 141*  --   BUN 19   < > 13 10   < > 25*   < > 18 17 21   CREATININE 0.64   < > 0.72 0.54   < > 0.49   < > 0.62 0.52 0.7  CALCIUM 8.9   < > 8.5* 8.5*   < > 9.2   < > 9.5 9.2 9.7  MG 1.4*   < > 1.9 1.8  --   --   --   --  1.2*  --   PHOS 3.0  --   --   --   --   --   --   --   --   --    < > = values in this interval not displayed.   Recent Labs     01/08/22 0327 04/25/22 0948 05/15/22 1404 06/21/22 0000  AST 15 26 23 13   ALT 14 23 15 12   ALKPHOS 52 43 62 63  BILITOT 0.4 0.9 0.5  --   PROT 5.2* 7.0 6.5  --   ALBUMIN 2.7* 4.1 3.0* 3.4*   Recent Labs    05/31/22 0000 06/08/22 0140 06/09/22 0443 06/10/22 0440 06/11/22 0949 06/21/22 0000  WBC 6.9 12.0* 7.1  --  8.1 9.2  NEUTROABS 3,374.00 8.2*  --   --   --  6,348.00  HGB 10.2* 12.0 9.5* 9.9* 10.4* 10.4*  HCT 32* 38.0 29.8* 30.9* 32.3* 32*  MCV  --  89.2 90.3  --  87.8  --   PLT 386 382 314  --  322 357   No results found for: "TSH" Lab Results  Component Value Date   HGBA1C 5.6 04/26/2022   Lab Results  Component Value Date   CHOL 233 (H) 04/26/2022   HDL 53 04/26/2022   LDLCALC 122 (H) 04/26/2022   LDLDIRECT 114 (H) 04/26/2022   TRIG 292 (H) 04/26/2022   CHOLHDL 4.4 04/26/2022    Significant Diagnostic Results in last 30 days:  CT Cervical Spine Wo Contrast  Result Date: 06/19/2022 CLINICAL DATA:  89 year old female status post fall. Chronic ununited type 2 odontoid fracture. EXAM: CT CERVICAL SPINE WITHOUT CONTRAST TECHNIQUE: Multidetector CT imaging of the cervical spine was performed without intravenous contrast. Multiplanar CT image reconstructions were also generated. RADIATION DOSE REDUCTION: This exam was performed according to the departmental dose-optimization program  which includes automated exposure control, adjustment of the mA and/or kV according to patient size and/or use of iterative reconstruction technique. COMPARISON:  Head CT today. Recent brain MRI, CTA head and neck. Cervical spine CT 06/08/2022. FINDINGS: Alignment: Stable. Multilevel degenerative appearing mild spondylolisthesis including C2-C3, C4-C5, C7-T1, also T1-T2. Bilateral posterior element alignment is within normal limits. Skull base and vertebrae: No acute osseous abnormality identified. Chronic unhealed bilateral lateral C1 ring fractures, type 2 odontoid fracture. Stable  craniocervical alignment. Underlying widespread chronic cervical facet ankylosis. Soft tissues and spinal canal: No prevertebral fluid or swelling. No visible canal hematoma. Stable noncontrast visible neck soft tissues. Disc levels:  Stable. Upper chest: Chronic T1 mild anterior wedge compression fracture appears stable. Noncontrast visible upper chest is stable. IMPRESSION: 1. No acute traumatic injury identified in the cervical spine. 2. Chronic unhealed C1 ring and type 2 odontoid fractures. Stable craniocervical and cervical vertebral alignment. Chronic cervical facet ankylosis. 3. Chronic T1 mild anterior wedge compression fracture. Electronically Signed   By: Odessa Fleming M.D.   On: 06/19/2022 06:19   CT HEAD WO CONTRAST ( )  Result Date: 06/19/2022 CLINICAL DATA:  89 year old female status post fall. EXAM: CT HEAD WITHOUT CONTRAST TECHNIQUE: Contiguous axial images were obtained from the base of the skull through the vertex without intravenous contrast. RADIATION DOSE REDUCTION: This exam was performed according to the departmental dose-optimization program which includes automated exposure control, adjustment of the mA and/or kV according to patient size and/or use of iterative reconstruction technique. COMPARISON:  Recent Brain MRI 06/10/2022. CTA head and neck 06/10/2022. And earlier. FINDINGS: Brain: Multifocal chronic encephalomalacia, cerebral white matter disease described recently by MRI is stable. No midline shift, ventriculomegaly, mass effect, evidence of mass lesion, intracranial hemorrhage or evidence of cortically based acute infarction. Vascular: Calcified atherosclerosis at the skull base. No suspicious intracranial vascular hyperdensity. Skull: Stable.  No acute osseous abnormality identified. Sinuses/Orbits: Unchanged left sphenoid sinus mucosal thickening, right mastoid effusion. Other paranasal sinuses remain well aerated. Other: Unresolved broad-based right forehead, supraorbital scalp  hematoma. Smaller new contralateral left anterior convexity scalp hematoma on series 3, image 48. No underlying calvarium fracture. IMPRESSION: 1. Small new left convexity scalp hematoma. Unresolved larger right forehead, supraorbital scalp hematoma. No underlying calvarium fracture. 2. Stable non contrast CT appearance of the brain. No acute intracranial abnormality. Electronically Signed   By: Odessa Fleming M.D.   On: 06/19/2022 06:16   CT ANGIO HEAD NECK W WO CM  Result Date: 06/10/2022 CLINICAL DATA:  Stroke workup EXAM: CT ANGIOGRAPHY HEAD AND NECK WITH AND WITHOUT CONTRAST TECHNIQUE: Multidetector CT imaging of the head and neck was performed using the standard protocol during bolus administration of intravenous contrast. Multiplanar CT image reconstructions and MIPs were obtained to evaluate the vascular anatomy. Carotid stenosis measurements (when applicable) are obtained utilizing NASCET criteria, using the distal internal carotid diameter as the denominator. RADIATION DOSE REDUCTION: This exam was performed according to the departmental dose-optimization program which includes automated exposure control, adjustment of the mA and/or kV according to patient size and/or use of iterative reconstruction technique. CONTRAST:  75mL OMNIPAQUE IOHEXOL 350 MG/ML SOLN COMPARISON:  CTA head neck 04/25/22, same day brain MRI. FINDINGS: CT HEAD FINDINGS Brain: No evidence of acute infarction, hemorrhage, hydrocephalus, extra-axial collection or mass lesion/mass effect. There is an evolving left cerebellar infarct. Sequela of mild chronic microvascular ischemic change. Vascular: See below Skull: Redemonstrated right frontal periorbital soft tissue hematoma. Evidence of underlying calvarial fracture. Degenerative changes of the right  TMJ. Sinuses/Orbits: Trace right mastoid effusion. No middle ear effusion. Frothy secretions in the left sphenoid sinus could be seen in the setting of acute sinusitis. Visualized orbits are  unremarkable. Other: None. Review of the MIP images confirms the above findings CTA NECK FINDINGS Aortic arch: Standard branching. Imaged portion shows no evidence of aneurysm or dissection. No significant stenosis of the major arch vessel origins. Right carotid system: No evidence of dissection, stenosis (50% or greater), or occlusion. Left carotid system: No evidence of dissection, stenosis (50% or greater), or occlusion. Vertebral arteries: Left vertebral artery is occluded at the V4 segment. There is reconstitution in the V3 and V4 segments, possibly secondary to retrograde flow. Right vertebral artery is normal in caliber. Skeleton: Redemonstrated is a chronic type 2 dens fracture. Multilevel degenerative changes in the cervical spine with moderate to severe disc space loss lower cervical spine. Other neck: Negative. Upper chest: Negative. Review of the MIP images confirms the above findings CTA HEAD FINDINGS Anterior circulation: Mild narrowing of the supraclinoid ICA on the left. Unchanged 2 mm posteriorly projecting outpouching at the proximal supraclinoid ICA on the right (series 10, image 129). Multifocal sites of moderate narrowing in the bilateral distal MCA territories (distal M2/M3). Moderate to severe stenoses in the bilateral distal A2 segments. Posterior circulation: No significant stenosis, proximal occlusion, aneurysm, or vascular malformation. Left PICA is intermittently contrast opacify. The distal portions are incompletely assessed. Venous sinuses: As permitted by contrast timing, patent. Diminutive left transverse sinus. Anatomic variants: None Review of the MIP images confirms the above findings IMPRESSION: 1. Evolving left cerebellar infarct. No acute intracranial hemorrhage or CT evidence of new infarct. 2. 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 3. Multifocal sites of moderate narrowing in the bilateral distal MCA and ACA territories  territories. 4. Unchanged 2 mm posteriorly projecting outpouching at the proximal supraclinoid ICA on the right, which could represent a small aneurysm. 5. Frothy secretions in the left sphenoid sinus could be seen in the setting of acute sinusitis. Electronically Signed   By: Lorenza Cambridge M.D.   On: 06/10/2022 13:27   MR CERVICAL SPINE WO CONTRAST  Result Date: 06/10/2022 CLINICAL DATA:  Fall forward. Trauma to head. Chronic dens fracture. Myelopathy, acute, cervical spine. EXAM: MRI CERVICAL SPINE WITHOUT CONTRAST TECHNIQUE: Multiplanar, multisequence MR imaging of the cervical spine was performed. No intravenous contrast was administered. COMPARISON:  MRI cervical spine 05/30/2021. CT of the cervical spine 06/08/2022 FINDINGS: Alignment: Slight anterolisthesis at C4-5 is stable. Slight anterolisthesis at T1-2 is stable. Straightening of the normal cervical lordosis is stable. Vertebrae: Chronic type 2 Modic changes are present at C5-6 and C6-7. Marrow signal and vertebral body heights are otherwise normal. No acute fractures are present. The chronic type 2 dens fracture is stable. Cord: Stable slight distortion of the spinal cord at the C5-6 level. Cord morphology is otherwise normal. No abnormal cord signal is present. Posterior Fossa, vertebral arteries, paraspinal tissues: The craniocervical junction is normal. Abnormal signal in the left V3 segment is consistent with chronic left vertebral artery occlusion. Disc levels: C2-3: Bilateral facet hypertrophy is present without focal stenosis. C3-4: Moderate facet hypertrophy is again noted bilaterally. A broad-based disc osteophyte complex partially effaces the ventral CSF. Moderate left and mild right foraminal stenosis is similar the prior study. C4-5: A leftward disc osteophyte complex is present. Asymmetric left-sided facet hypertrophy is similar the prior studies moderate left and mild right foraminal stenosis is present. C5-6: A  broad-based disc  osteophyte complex effaces the ventral CSF. Severe left and moderate right foraminal stenosis is present. Asymmetric left-sided facet hypertrophy is present. C6-7: A broad-based disc osteophyte complex effaces the ventral CSF. Uncovertebral and facet disease results in moderate foraminal stenosis bilaterally, similar the prior study. C7-T1: Moderate facet hypertrophy is present bilaterally. No significant stenosis is present. T1-2: The uncovered disc effaces the ventral CSF. The foramina are patent. IMPRESSION: 1. Stable multilevel spondylosis of the cervical spine. 2. No evidence for acute trauma to the cervical spinal cord. 3. Chronic type 2 dens fracture. 4. Moderate left and mild right foraminal stenosis at C3-4 and C4-5. 5. Severe left and moderate right foraminal stenosis at C5-6. 6. Moderate foraminal stenosis bilaterally at C6-7. 7. Chronic left vertebral artery occlusion. Electronically Signed   By: Marin Roberts M.D.   On: 06/10/2022 12:49   MR BRAIN WO CONTRAST  Result Date: 06/10/2022 CLINICAL DATA:  Neuro deficit, acute, stroke suspected. Trauma with hematoma. EXAM: MRI HEAD WITHOUT CONTRAST TECHNIQUE: Multiplanar, multiecho pulse sequences of the brain and surrounding structures were obtained without intravenous contrast. COMPARISON:  CT head without contrast 06/08/2022. MR head without contrast 05/18/2022. FINDINGS: Brain: The diffusion-weighted images demonstrate no acute or subacute infarct. Expected evolution noted in the posteromedial infarct of the left medulla. Encephalomalacia involving the medial and inferior left cerebellum is slightly more prominent than the prior study, also consistent with expected evolution. No acute diffusion abnormality is associated. Remote lacunar infarcts of the left caudate head and lentiform nucleus are stable. Remote lacunar infarcts of the left thalamus are stable. Moderate generalized white matter changes are similar the prior exam. The ventricles are  of normal size. No significant extraaxial fluid collection is present. The brainstem and cerebellum are within normal limits. Midline structures are within normal limits. Vascular: Abnormal flow signal is present in the left vertebral artery, consistent with chronic occlusion. This corresponds to the areas of recent infarct. Flow is present in the right vertebral artery and basilar artery. Flow is present in the internal carotid arteries bilaterally. Skull and upper cervical spine: The chronic dens fracture is again noted. The craniocervical junction is within normal limits proper cervical spine is otherwise normal. Sinuses/Orbits: Right mastoid effusion is present. The paranasal sinuses and mastoid air cells are otherwise clear. Bilateral lens replacements are noted. Globes and orbits are otherwise unremarkable. IMPRESSION: 1. No acute intracranial abnormality or significant interval change. 2. Expected evolution of the posteromedial left medulla infarct. 3. Encephalomalacia involving the medial and inferior left cerebellum is slightly more prominent than the prior study, also consistent with expected evolution. 4. Remote lacunar infarcts of the left caudate head and lentiform nucleus and left thalamus are stable. 5. Moderate generalized white matter disease is stable. This likely reflects the sequela of chronic microvascular ischemia. 6. Chronic occlusion of the left vertebral artery. 7. Chronic dens fracture. Electronically Signed   By: Marin Roberts M.D.   On: 06/10/2022 12:42   DG Foot 2 Views Left  Result Date: 06/08/2022 CLINICAL DATA:  Fall with abrasion to the left lateral foot and ankle associated with swelling EXAM: LEFT FOOT - 2 VIEW; LEFT ANKLE - 2 VIEW COMPARISON:  Left ankle radiograph dated 04/26/2021 FINDINGS: There are no findings of fracture or dislocation. No joint effusion. Degenerative changes of the foot. Ankle mortise is intact. Soft tissues are unremarkable. IMPRESSION: No acute  fracture or dislocation. Electronically Signed   By: Agustin Cree M.D.   On: 06/08/2022 10:34   DG  Ankle 2 Views Left  Result Date: 06/08/2022 CLINICAL DATA:  Fall with abrasion to the left lateral foot and ankle associated with swelling EXAM: LEFT FOOT - 2 VIEW; LEFT ANKLE - 2 VIEW COMPARISON:  Left ankle radiograph dated 04/26/2021 FINDINGS: There are no findings of fracture or dislocation. No joint effusion. Degenerative changes of the foot. Ankle mortise is intact. Soft tissues are unremarkable. IMPRESSION: No acute fracture or dislocation. Electronically Signed   By: Agustin Cree M.D.   On: 06/08/2022 10:34   CT HEAD WO CONTRAST ( )  Result Date: 06/08/2022 CLINICAL DATA:  Unwitnessed fall. Large hematoma above the right eyebrow. Vomiting in triage. EXAM: CT HEAD WITHOUT CONTRAST CT CERVICAL SPINE WITHOUT CONTRAST TECHNIQUE: Multidetector CT imaging of the head and cervical spine was performed following the standard protocol without intravenous contrast. Multiplanar CT image reconstructions of the cervical spine were also generated. RADIATION DOSE REDUCTION: This exam was performed according to the departmental dose-optimization program which includes automated exposure control, adjustment of the mA and/or kV according to patient size and/or use of iterative reconstruction technique. COMPARISON:  MRI head 05/18/2022 and MRI cervical spine 12/30/2021; CT head and cervical spine 12/30/2021 FINDINGS: CT HEAD FINDINGS Brain: No intracranial hemorrhage, mass effect, or evidence of acute infarct. No hydrocephalus. No extra-axial fluid collection. Generalized cerebral atrophy. Ill-defined hypoattenuation within the cerebral white matter is nonspecific but consistent with chronic small vessel ischemic disease. Chronic left cerebellar and right frontal lobe infarcts. Chronic lacunar infarct left basal ganglia. Vascular: No hyperdense vessel. Intracranial arterial calcification. Skull: Large hematoma about the right  forehead. No underlying calvarial fracture. Sinuses/Orbits: No acute finding. Mild mucosal thickening left sphenoid sinus. Opacification of a few right mastoid air cells. Other: None. CT CERVICAL SPINE FINDINGS Alignment: No evidence of traumatic malalignment. Unchanged multilevel grade 1 anterolisthesis of C2, C3, C4 and C7. Skull base and vertebrae: No acute fracture. Redemonstrated type 2 dens fracture and mildly displaced bilateral posterior arch fractures of C1. The dens fracture appears slightly more distracted and angulated posteriorly. No evidence of spinal canal narrowing. No acute fracture. Soft tissues and spinal canal: No prevertebral fluid or swelling. No visible canal hematoma. Disc levels: Advanced multilevel spondylosis, disc space height loss, degenerative endplate changes greatest at C5-C6 and C6-C7. Multilevel advanced facet arthropathy. Posterior disc osteophyte complexes C5-C6 and C6-C7 cause mild-moderate effacement of the ventral thecal sac. Uncovertebral spurring and facet arthropathy cause advanced multilevel neural foraminal narrowing. Upper chest: No acute abnormality. Other: None. IMPRESSION: 1. No acute intracranial abnormality. 2. Large scalp hematoma about the right forehead. No calvarial fracture. 3. No acute fracture in the cervical spine. Redemonstrated type 2 dens fracture and bilateral posterior arch of C1 fractures. Electronically Signed   By: Minerva Fester M.D.   On: 06/08/2022 02:06   CT Cervical Spine Wo Contrast  Result Date: 06/08/2022 CLINICAL DATA:  Unwitnessed fall. Large hematoma above the right eyebrow. Vomiting in triage. EXAM: CT HEAD WITHOUT CONTRAST CT CERVICAL SPINE WITHOUT CONTRAST TECHNIQUE: Multidetector CT imaging of the head and cervical spine was performed following the standard protocol without intravenous contrast. Multiplanar CT image reconstructions of the cervical spine were also generated. RADIATION DOSE REDUCTION: This exam was performed according  to the departmental dose-optimization program which includes automated exposure control, adjustment of the mA and/or kV according to patient size and/or use of iterative reconstruction technique. COMPARISON:  MRI head 05/18/2022 and MRI cervical spine 12/30/2021; CT head and cervical spine 12/30/2021 FINDINGS: CT HEAD FINDINGS Brain: No intracranial hemorrhage,  mass effect, or evidence of acute infarct. No hydrocephalus. No extra-axial fluid collection. Generalized cerebral atrophy. Ill-defined hypoattenuation within the cerebral white matter is nonspecific but consistent with chronic small vessel ischemic disease. Chronic left cerebellar and right frontal lobe infarcts. Chronic lacunar infarct left basal ganglia. Vascular: No hyperdense vessel. Intracranial arterial calcification. Skull: Large hematoma about the right forehead. No underlying calvarial fracture. Sinuses/Orbits: No acute finding. Mild mucosal thickening left sphenoid sinus. Opacification of a few right mastoid air cells. Other: None. CT CERVICAL SPINE FINDINGS Alignment: No evidence of traumatic malalignment. Unchanged multilevel grade 1 anterolisthesis of C2, C3, C4 and C7. Skull base and vertebrae: No acute fracture. Redemonstrated type 2 dens fracture and mildly displaced bilateral posterior arch fractures of C1. The dens fracture appears slightly more distracted and angulated posteriorly. No evidence of spinal canal narrowing. No acute fracture. Soft tissues and spinal canal: No prevertebral fluid or swelling. No visible canal hematoma. Disc levels: Advanced multilevel spondylosis, disc space height loss, degenerative endplate changes greatest at C5-C6 and C6-C7. Multilevel advanced facet arthropathy. Posterior disc osteophyte complexes C5-C6 and C6-C7 cause mild-moderate effacement of the ventral thecal sac. Uncovertebral spurring and facet arthropathy cause advanced multilevel neural foraminal narrowing. Upper chest: No acute abnormality.  Other: None. IMPRESSION: 1. No acute intracranial abnormality. 2. Large scalp hematoma about the right forehead. No calvarial fracture. 3. No acute fracture in the cervical spine. Redemonstrated type 2 dens fracture and bilateral posterior arch of C1 fractures. Electronically Signed   By: Minerva Fester M.D.   On: 06/08/2022 02:06    Assessment/Plan Recurrent falls  Protein-calorie malnutrition, moderate (HCC)  Do not resuscitate - Plan: Do not attempt resuscitation (DNR)  Hypertension, unspecified type  Vertigo following cerebrovascular accident  CVA, old, cognitive deficits  Physical debility  Decreased urine output Patient has had multiple falls in the setting of poor reconditioning following her recent cerebellar stroke lingering symptoms of dizziness and headaches after a stroke. Patient has poor short term memory. She has not participated with physical therapy and they have released her from [facility]. Discussed with spouse and patient concern regarding weight loss ~10 lbs in the last month of her stay in the facility. Patient has been readmitted and seen in the emergency department on multiple occasions secondary to poor mobility and poor PO intake. Patient is at high risk for readmission and continued decline. Previous trials of meclizine, reglan, phenergan, zofran, etc. Patient has had multiple head injuries including 2016, 2023, and now additional intracranial changes after CVA. Discussed concern for benzodiazepines given potential for increased confusion, falls, and dependence. BP moderately controlled on current regimen. Patient recently pulled her foley catheter. She had low urine output but it was not formally measured to show that she had oliguria. Discussed this concern with patient and spouse as well. Plan to test urine for potential infection. It is more likely that patient has decreased urine output due to poor PO intake.    Family/ staff Communication: nursing,  spouse  Labs/tests ordered:  BMP, UCX, CBC

## 2022-06-26 ENCOUNTER — Other Ambulatory Visit: Payer: Self-pay | Admitting: Orthopedic Surgery

## 2022-06-26 DIAGNOSIS — N3000 Acute cystitis without hematuria: Secondary | ICD-10-CM

## 2022-06-26 MED ORDER — CIPROFLOXACIN HCL 500 MG PO TABS
500.0000 mg | ORAL_TABLET | Freq: Two times a day (BID) | ORAL | 0 refills | Status: AC
Start: 2022-06-26 — End: 2022-07-01

## 2022-06-26 NOTE — Progress Notes (Signed)
[facility] reports positive UTI, > 100,00 cfu/ML Haemophilus. Cipro 500 mg po BID x 5 days started. Advised to encourage hydration with water.

## 2022-06-28 ENCOUNTER — Encounter: Payer: Self-pay | Admitting: Student

## 2022-06-28 ENCOUNTER — Non-Acute Institutional Stay (SKILLED_NURSING_FACILITY): Payer: Medicare Other | Admitting: Student

## 2022-06-28 DIAGNOSIS — N390 Urinary tract infection, site not specified: Secondary | ICD-10-CM | POA: Diagnosis not present

## 2022-06-28 DIAGNOSIS — Z7189 Other specified counseling: Secondary | ICD-10-CM

## 2022-06-28 DIAGNOSIS — Z8673 Personal history of transient ischemic attack (TIA), and cerebral infarction without residual deficits: Secondary | ICD-10-CM

## 2022-06-28 DIAGNOSIS — E440 Moderate protein-calorie malnutrition: Secondary | ICD-10-CM | POA: Diagnosis not present

## 2022-06-28 DIAGNOSIS — G3184 Mild cognitive impairment, so stated: Secondary | ICD-10-CM

## 2022-06-28 DIAGNOSIS — R296 Repeated falls: Secondary | ICD-10-CM | POA: Diagnosis not present

## 2022-06-28 DIAGNOSIS — Z66 Do not resuscitate: Secondary | ICD-10-CM

## 2022-06-28 LAB — BASIC METABOLIC PANEL
BUN: 21 (ref 4–21)
CO2: 27 — AB (ref 13–22)
Chloride: 102 (ref 99–108)
Creatinine: 0.6 (ref 0.5–1.1)
Glucose: 89
Potassium: 4.2 mEq/L (ref 3.5–5.1)
Sodium: 139 (ref 137–147)

## 2022-06-28 LAB — CBC: RBC: 3.72 — AB (ref 3.87–5.11)

## 2022-06-28 LAB — CBC AND DIFFERENTIAL
HCT: 32 — AB (ref 36–46)
Hemoglobin: 10.1 — AB (ref 12.0–16.0)
Neutrophils Absolute: 4897
Platelets: 405 10*3/uL — AB (ref 150–400)
WBC: 8.4

## 2022-06-28 LAB — COMPREHENSIVE METABOLIC PANEL
Calcium: 9.5 (ref 8.7–10.7)
eGFR: 90

## 2022-06-28 NOTE — Progress Notes (Unsigned)
Location:  Other [place].  Nursing Home Room Number: [facility] 116A Place of Service:  SNF [phone]) Provider:  Dr. Earnestine Mealing  PCP: Danella Penton, MD  Patient Care Team: Danella Penton, MD as PCP - General (Internal Medicine) Mariah Milling Tollie Pizza, MD as Consulting Physician (Cardiology)  Extended Emergency Contact Information Primary Emergency Contact: Cardoza,james Sr. Home Phone: [phone] Mobile Phone: [phone] Relation: Spouse Secondary Emergency Contact: Asa Lente Home Phone: [phone] Mobile Phone: [phone] Relation: Son  Code Status:  DNR Goals of care: Advanced Directive information    06/28/2022   12:09 PM  Advanced Directives  Does Patient Have a Medical Advance Directive? Yes  Type of Advance Directive Out of facility DNR (pink MOST or yellow form)  Does patient want to make changes to medical advance directive? No - Patient declined     Chief Complaint  Patient presents with   Acute Visit    Goals of Care.     HPI:  Pt is a 89 y.o. female seen today for an acute visit for Goals of Care. Patient is alert and sitting up in recliner chair. She is at her baseline of disorientation stating we are in [place]. She is oriented to 2024.Her husband and son are at bedside. Husband has deferred decision making to his son, Freida Busman.   Advance Care Planning  Patient's son and HCPOA Freida Busman is present for the conversation. His synopsis of her current status she has poor short term memory. She is starting to sit up, however, physical therapy has signed off which doesn't bode well for her continued improvement. He has noticed that she continues to have worsening confusion with each hospitalization, more confusion. Would prefer to continue care at the facility for future work up. DO NOT HOSPITALIZE and DNR status confirmed during encounter. Patient has lost 10 lbs in the last month discussed if she does not gain or maintain in the upcoming weeks  will transition to hospice. Will consider 2nd mood medication with mirtazapine for a trial understanding this may not impact outcomes. Son understands eventually she will run out of antibiotics for UTIs-- she is currently being treated for her third UTI in the last month.        Past Medical History:  Diagnosis Date   Actinic keratosis    Breast cancer (HCC)    C1 cervical fracture (HCC)    Hypertension    Osteoarthritis    Past Surgical History:  Procedure Laterality Date   BACK SURGERY     BIOPSY  05/03/2022   Procedure: BIOPSY;  Surgeon: Benancio Deeds, MD;  Location: [facility] ENDOSCOPY;  Service: Gastroenterology;;   ESOPHAGOGASTRODUODENOSCOPY (EGD) WITH PROPOFOL N/A 05/03/2022   Procedure: ESOPHAGOGASTRODUODENOSCOPY (EGD) WITH PROPOFOL;  Surgeon: Benancio Deeds, MD;  Location: [facility] ENDOSCOPY;  Service: Gastroenterology;  Laterality: N/A;   HEMOSTASIS CONTROL  05/03/2022   Procedure: HEMOSTASIS CONTROL;  Surgeon: Benancio Deeds, MD;  Location: [facility] ENDOSCOPY;  Service: Gastroenterology;;   IR INJECT/THERA/INC NEEDLE/CATH/PLC EPI/LUMB/SAC [facility]  04/29/2021   MOHS SURGERY      Allergies  Allergen Reactions   Morphine     Other reaction(s): Other (See Comments) Apnea with large dose Respiratory Distress    Buprenorphine Hcl     Other reaction(s): Other (See Comments), Other (See Comments) Respiratory Distress Respiratory Distress    Codeine    Epinephrine     Other reaction(s): Other (See Comments) Severe anxiety; "makes my heart race" - would like to avoid during surgery  Morphine And Related Other (See Comments)    Respiratory Distress    Percocet [Oxycodone-Acetaminophen]     Outpatient Encounter Medications as of 06/28/2022  Medication Sig   acetaminophen (TYLENOL) 325 MG tablet Take 2 tablets (650 mg total) by mouth every 6 (six) hours as needed for headache.   amLODipine (NORVASC) 5 MG tablet Take 1 tablet (5 mg total) by mouth daily.   aspirin EC 81 MG  tablet Take 1 tablet (81 mg total) by mouth daily. Swallow whole.   atorvastatin (LIPITOR) 40 MG tablet Take 1 tablet (40 mg total) by mouth daily.   Baclofen 5 MG TABS Take 1 tablet (5 mg total) by mouth 2 (two) times daily as needed (HEADACHES AND HICCUPS).   cholecalciferol (VITAMIN D) 25 MCG (1000 UNIT) tablet Take 2,000 Units by mouth daily.   ciprofloxacin (CIPRO) 500 MG tablet Take 1 tablet (500 mg total) by mouth 2 (two) times daily for 5 days.   Cyanocobalamin (B-12) 1000 MCG CAPS Take 2,000 mcg by mouth daily.   labetalol (NORMODYNE) 100 MG tablet Take 100 mg by mouth daily.   lactose free nutrition (BOOST) LIQD Take 237 mLs by mouth 3 (three) times daily between meals.   loperamide (IMODIUM A-D) 2 MG tablet Take 1 tablet (2 mg total) by mouth 4 (four) times daily as needed for diarrhea or loose stools.   ondansetron (ZOFRAN) 4 MG tablet Take one tablet by mouth every 6 hours as needed.   pantoprazole (PROTONIX) 40 MG tablet Take 1 tablet (40 mg total) by mouth 2 (two) times daily for 21 days, THEN 1 tablet (40 mg total) daily.   polyethylene glycol powder (GLYCOLAX/MIRALAX) 17 GM/SCOOP powder Take 17 g by mouth every 12 (twelve) hours as needed for mild constipation. And once daily in the morning scheduled   promethazine (PHENERGAN) 12.5 MG tablet Take 1 tablet (12.5 mg total) by mouth every 8 (eight) hours as needed for nausea or vomiting.   senna (SENOKOT) 8.6 MG TABS tablet Take 2 tablets by mouth at bedtime.   sertraline (ZOLOFT) 50 MG tablet Take 50 mg by mouth daily.   tamsulosin (FLOMAX) 0.4 MG CAPS capsule Take 0.4 mg by mouth daily after supper.   No facility-administered encounter medications on file as of 06/28/2022.    Review of Systems  Immunization History  Administered Date(s) Administered   Influenza Inj Mdck Quad Pf 11/30/2021   Influenza, High Dose Seasonal PF 12/16/2020   Influenza-Unspecified 12/06/2011, 11/27/2012, 01/19/2014, 12/11/2014, 12/03/2015,  12/10/2016, 12/18/2019   PFIZER Comirnaty(Gray Top)Covid-19 Tri-Sucrose Vaccine 03/13/2019, 04/03/2019, 12/05/2019   Pneumococcal Conjugate-13 12/31/2016, 01/05/2017   Td (Adult),5 Lf Tetanus Toxid, Preservative Free 07/16/2019   Tdap 06/18/2011   Zoster Recombinat (Shingrix) 06/30/2016, 10/26/2017, 01/06/2018   Pertinent  Health Maintenance Due  Topic Date Due   DEXA SCAN  Never done   INFLUENZA VACCINE  09/30/2022      01/08/2022    9:00 AM 01/08/2022    7:58 PM 01/09/2022    9:00 AM 01/09/2022    8:25 PM 01/10/2022    8:50 AM  Fall Risk  (RETIRED) Patient Fall Risk Level High fall risk Moderate fall risk High fall risk High fall risk High fall risk   Functional Status Survey:    Vitals:   06/28/22 1200 06/28/22 1211  BP: (!) 160/54 (!) 151/87  Pulse: 74   Resp: 18   Temp: 97.9 F (36.6 C)   SpO2: 91%   Weight: 115 lb 9.6 oz (52.4 kg)  Height: 5\' 2"  (1.575 m)    Body mass index is 21.14 kg/m. Physical Exam Cardiovascular:     Rate and Rhythm: Normal rate.     Pulses: Normal pulses.  Pulmonary:     Effort: Pulmonary effort is normal.  Skin:    General: Skin is warm and dry.  Neurological:     Mental Status: She is alert. Mental status is at baseline. She is disoriented.     Labs reviewed: Recent Labs    04/26/22 0320 05/01/22 1251 05/07/22 1052 05/08/22 0425 05/10/22 0657 05/19/22 1028 05/27/22 0000 06/08/22 0140 06/11/22 0949 06/21/22 0000  NA 133*   < > 134* 135   < > 138   < > 137 137 137  K 3.5   < > 3.5 3.8   < > 3.7   < > 4.0 3.3* 4.0  CL 97*   < > 100 103   < > 103   < > 103 105 100  CO2 22   < > 23 23   < > 23   < > 23 21* 26*  GLUCOSE 131*   < > 142* 100*   < > 152*  --  135* 141*  --   BUN 19   < > 13 10   < > 25*   < > 18 17 21   CREATININE 0.64   < > 0.72 0.54   < > 0.49   < > 0.62 0.52 0.7  CALCIUM 8.9   < > 8.5* 8.5*   < > 9.2   < > 9.5 9.2 9.7  MG 1.4*   < > 1.9 1.8  --   --   --   --  1.2*  --   PHOS 3.0  --   --   --   --   --    --   --   --   --    < > = values in this interval not displayed.   Recent Labs    01/08/22 0327 04/25/22 0948 05/15/22 1404 06/21/22 0000  AST 15 26 23 13   ALT 14 23 15 12   ALKPHOS 52 43 62 63  BILITOT 0.4 0.9 0.5  --   PROT 5.2* 7.0 6.5  --   ALBUMIN 2.7* 4.1 3.0* 3.4*   Recent Labs    05/31/22 0000 06/08/22 0140 06/09/22 0443 06/10/22 0440 06/11/22 0949 06/21/22 0000  WBC 6.9 12.0* 7.1  --  8.1 9.2  NEUTROABS 3,374.00 8.2*  --   --   --  6,348.00  HGB 10.2* 12.0 9.5* 9.9* 10.4* 10.4*  HCT 32* 38.0 29.8* 30.9* 32.3* 32*  MCV  --  89.2 90.3  --  87.8  --   PLT 386 382 314  --  322 357   No results found for: "TSH" Lab Results  Component Value Date   HGBA1C 5.6 04/26/2022   Lab Results  Component Value Date   CHOL 233 (H) 04/26/2022   HDL 53 04/26/2022   LDLCALC 122 (H) 04/26/2022   LDLDIRECT 114 (H) 04/26/2022   TRIG 292 (H) 04/26/2022   CHOLHDL 4.4 04/26/2022    Significant Diagnostic Results in last 30 days:  CT Cervical Spine Wo Contrast  Result Date: 06/19/2022 CLINICAL DATA:  89 year old female status post fall. Chronic ununited type 2 odontoid fracture. EXAM: CT CERVICAL SPINE WITHOUT CONTRAST TECHNIQUE: Multidetector CT imaging of the cervical spine was performed without intravenous contrast. Multiplanar CT image reconstructions were also generated. RADIATION  DOSE REDUCTION: This exam was performed according to the departmental dose-optimization program which includes automated exposure control, adjustment of the mA and/or kV according to patient size and/or use of iterative reconstruction technique. COMPARISON:  Head CT today. Recent brain MRI, CTA head and neck. Cervical spine CT 06/08/2022. FINDINGS: Alignment: Stable. Multilevel degenerative appearing mild spondylolisthesis including C2-C3, C4-C5, C7-T1, also T1-T2. Bilateral posterior element alignment is within normal limits. Skull base and vertebrae: No acute osseous abnormality identified. Chronic  unhealed bilateral lateral C1 ring fractures, type 2 odontoid fracture. Stable craniocervical alignment. Underlying widespread chronic cervical facet ankylosis. Soft tissues and spinal canal: No prevertebral fluid or swelling. No visible canal hematoma. Stable noncontrast visible neck soft tissues. Disc levels:  Stable. Upper chest: Chronic T1 mild anterior wedge compression fracture appears stable. Noncontrast visible upper chest is stable. IMPRESSION: 1. No acute traumatic injury identified in the cervical spine. 2. Chronic unhealed C1 ring and type 2 odontoid fractures. Stable craniocervical and cervical vertebral alignment. Chronic cervical facet ankylosis. 3. Chronic T1 mild anterior wedge compression fracture. Electronically Signed   By: Odessa Fleming M.D.   On: 06/19/2022 06:19   CT HEAD WO CONTRAST ( )  Result Date: 06/19/2022 CLINICAL DATA:  89 year old female status post fall. EXAM: CT HEAD WITHOUT CONTRAST TECHNIQUE: Contiguous axial images were obtained from the base of the skull through the vertex without intravenous contrast. RADIATION DOSE REDUCTION: This exam was performed according to the departmental dose-optimization program which includes automated exposure control, adjustment of the mA and/or kV according to patient size and/or use of iterative reconstruction technique. COMPARISON:  Recent Brain MRI 06/10/2022. CTA head and neck 06/10/2022. And earlier. FINDINGS: Brain: Multifocal chronic encephalomalacia, cerebral white matter disease described recently by MRI is stable. No midline shift, ventriculomegaly, mass effect, evidence of mass lesion, intracranial hemorrhage or evidence of cortically based acute infarction. Vascular: Calcified atherosclerosis at the skull base. No suspicious intracranial vascular hyperdensity. Skull: Stable.  No acute osseous abnormality identified. Sinuses/Orbits: Unchanged left sphenoid sinus mucosal thickening, right mastoid effusion. Other paranasal sinuses remain  well aerated. Other: Unresolved broad-based right forehead, supraorbital scalp hematoma. Smaller new contralateral left anterior convexity scalp hematoma on series 3, image 48. No underlying calvarium fracture. IMPRESSION: 1. Small new left convexity scalp hematoma. Unresolved larger right forehead, supraorbital scalp hematoma. No underlying calvarium fracture. 2. Stable non contrast CT appearance of the brain. No acute intracranial abnormality. Electronically Signed   By: Odessa Fleming M.D.   On: 06/19/2022 06:16   CT ANGIO HEAD NECK W WO CM  Result Date: 06/10/2022 CLINICAL DATA:  Stroke workup EXAM: CT ANGIOGRAPHY HEAD AND NECK WITH AND WITHOUT CONTRAST TECHNIQUE: Multidetector CT imaging of the head and neck was performed using the standard protocol during bolus administration of intravenous contrast. Multiplanar CT image reconstructions and MIPs were obtained to evaluate the vascular anatomy. Carotid stenosis measurements (when applicable) are obtained utilizing NASCET criteria, using the distal internal carotid diameter as the denominator. RADIATION DOSE REDUCTION: This exam was performed according to the departmental dose-optimization program which includes automated exposure control, adjustment of the mA and/or kV according to patient size and/or use of iterative reconstruction technique. CONTRAST:  75mL OMNIPAQUE IOHEXOL 350 MG/ML SOLN COMPARISON:  CTA head neck 04/25/22, same day brain MRI. FINDINGS: CT HEAD FINDINGS Brain: No evidence of acute infarction, hemorrhage, hydrocephalus, extra-axial collection or mass lesion/mass effect. There is an evolving left cerebellar infarct. Sequela of mild chronic microvascular ischemic change. Vascular: See below Skull: Redemonstrated right frontal periorbital soft  tissue hematoma. Evidence of underlying calvarial fracture. Degenerative changes of the right TMJ. Sinuses/Orbits: Trace right mastoid effusion. No middle ear effusion. Frothy secretions in the left sphenoid  sinus could be seen in the setting of acute sinusitis. Visualized orbits are unremarkable. Other: None. Review of the MIP images confirms the above findings CTA NECK FINDINGS Aortic arch: Standard branching. Imaged portion shows no evidence of aneurysm or dissection. No significant stenosis of the major arch vessel origins. Right carotid system: No evidence of dissection, stenosis (50% or greater), or occlusion. Left carotid system: No evidence of dissection, stenosis (50% or greater), or occlusion. Vertebral arteries: Left vertebral artery is occluded at the V4 segment. There is reconstitution in the V3 and V4 segments, possibly secondary to retrograde flow. Right vertebral artery is normal in caliber. Skeleton: Redemonstrated is a chronic type 2 dens fracture. Multilevel degenerative changes in the cervical spine with moderate to severe disc space loss lower cervical spine. Other neck: Negative. Upper chest: Negative. Review of the MIP images confirms the above findings CTA HEAD FINDINGS Anterior circulation: Mild narrowing of the supraclinoid ICA on the left. Unchanged 2 mm posteriorly projecting outpouching at the proximal supraclinoid ICA on the right (series 10, image 129). Multifocal sites of moderate narrowing in the bilateral distal MCA territories (distal M2/M3). Moderate to severe stenoses in the bilateral distal A2 segments. Posterior circulation: No significant stenosis, proximal occlusion, aneurysm, or vascular malformation. Left PICA is intermittently contrast opacify. The distal portions are incompletely assessed. Venous sinuses: As permitted by contrast timing, patent. Diminutive left transverse sinus. Anatomic variants: None Review of the MIP images confirms the above findings IMPRESSION: 1. Evolving left cerebellar infarct. No acute intracranial hemorrhage or CT evidence of new infarct. 2. 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 3. Multifocal sites of  moderate narrowing in the bilateral distal MCA and ACA territories territories. 4. Unchanged 2 mm posteriorly projecting outpouching at the proximal supraclinoid ICA on the right, which could represent a small aneurysm. 5. Frothy secretions in the left sphenoid sinus could be seen in the setting of acute sinusitis. Electronically Signed   By: Lorenza Cambridge M.D.   On: 06/10/2022 13:27   MR CERVICAL SPINE WO CONTRAST  Result Date: 06/10/2022 CLINICAL DATA:  Fall forward. Trauma to head. Chronic dens fracture. Myelopathy, acute, cervical spine. EXAM: MRI CERVICAL SPINE WITHOUT CONTRAST TECHNIQUE: Multiplanar, multisequence MR imaging of the cervical spine was performed. No intravenous contrast was administered. COMPARISON:  MRI cervical spine 05/30/2021. CT of the cervical spine 06/08/2022 FINDINGS: Alignment: Slight anterolisthesis at C4-5 is stable. Slight anterolisthesis at T1-2 is stable. Straightening of the normal cervical lordosis is stable. Vertebrae: Chronic type 2 Modic changes are present at C5-6 and C6-7. Marrow signal and vertebral body heights are otherwise normal. No acute fractures are present. The chronic type 2 dens fracture is stable. Cord: Stable slight distortion of the spinal cord at the C5-6 level. Cord morphology is otherwise normal. No abnormal cord signal is present. Posterior Fossa, vertebral arteries, paraspinal tissues: The craniocervical junction is normal. Abnormal signal in the left V3 segment is consistent with chronic left vertebral artery occlusion. Disc levels: C2-3: Bilateral facet hypertrophy is present without focal stenosis. C3-4: Moderate facet hypertrophy is again noted bilaterally. A broad-based disc osteophyte complex partially effaces the ventral CSF. Moderate left and mild right foraminal stenosis is similar the prior study. C4-5: A leftward disc osteophyte complex is present. Asymmetric left-sided facet hypertrophy is similar the prior  studies moderate left and mild  right foraminal stenosis is present. C5-6: A broad-based disc osteophyte complex effaces the ventral CSF. Severe left and moderate right foraminal stenosis is present. Asymmetric left-sided facet hypertrophy is present. C6-7: A broad-based disc osteophyte complex effaces the ventral CSF. Uncovertebral and facet disease results in moderate foraminal stenosis bilaterally, similar the prior study. C7-T1: Moderate facet hypertrophy is present bilaterally. No significant stenosis is present. T1-2: The uncovered disc effaces the ventral CSF. The foramina are patent. IMPRESSION: 1. Stable multilevel spondylosis of the cervical spine. 2. No evidence for acute trauma to the cervical spinal cord. 3. Chronic type 2 dens fracture. 4. Moderate left and mild right foraminal stenosis at C3-4 and C4-5. 5. Severe left and moderate right foraminal stenosis at C5-6. 6. Moderate foraminal stenosis bilaterally at C6-7. 7. Chronic left vertebral artery occlusion. Electronically Signed   By: Marin Roberts M.D.   On: 06/10/2022 12:49   MR BRAIN WO CONTRAST  Result Date: 06/10/2022 CLINICAL DATA:  Neuro deficit, acute, stroke suspected. Trauma with hematoma. EXAM: MRI HEAD WITHOUT CONTRAST TECHNIQUE: Multiplanar, multiecho pulse sequences of the brain and surrounding structures were obtained without intravenous contrast. COMPARISON:  CT head without contrast 06/08/2022. MR head without contrast 05/18/2022. FINDINGS: Brain: The diffusion-weighted images demonstrate no acute or subacute infarct. Expected evolution noted in the posteromedial infarct of the left medulla. Encephalomalacia involving the medial and inferior left cerebellum is slightly more prominent than the prior study, also consistent with expected evolution. No acute diffusion abnormality is associated. Remote lacunar infarcts of the left caudate head and lentiform nucleus are stable. Remote lacunar infarcts of the left thalamus are stable. Moderate generalized white  matter changes are similar the prior exam. The ventricles are of normal size. No significant extraaxial fluid collection is present. The brainstem and cerebellum are within normal limits. Midline structures are within normal limits. Vascular: Abnormal flow signal is present in the left vertebral artery, consistent with chronic occlusion. This corresponds to the areas of recent infarct. Flow is present in the right vertebral artery and basilar artery. Flow is present in the internal carotid arteries bilaterally. Skull and upper cervical spine: The chronic dens fracture is again noted. The craniocervical junction is within normal limits proper cervical spine is otherwise normal. Sinuses/Orbits: Right mastoid effusion is present. The paranasal sinuses and mastoid air cells are otherwise clear. Bilateral lens replacements are noted. Globes and orbits are otherwise unremarkable. IMPRESSION: 1. No acute intracranial abnormality or significant interval change. 2. Expected evolution of the posteromedial left medulla infarct. 3. Encephalomalacia involving the medial and inferior left cerebellum is slightly more prominent than the prior study, also consistent with expected evolution. 4. Remote lacunar infarcts of the left caudate head and lentiform nucleus and left thalamus are stable. 5. Moderate generalized white matter disease is stable. This likely reflects the sequela of chronic microvascular ischemia. 6. Chronic occlusion of the left vertebral artery. 7. Chronic dens fracture. Electronically Signed   By: Marin Roberts M.D.   On: 06/10/2022 12:42   DG Foot 2 Views Left  Result Date: 06/08/2022 CLINICAL DATA:  Fall with abrasion to the left lateral foot and ankle associated with swelling EXAM: LEFT FOOT - 2 VIEW; LEFT ANKLE - 2 VIEW COMPARISON:  Left ankle radiograph dated 04/26/2021 FINDINGS: There are no findings of fracture or dislocation. No joint effusion. Degenerative changes of the foot. Ankle mortise is  intact. Soft tissues are unremarkable. IMPRESSION: No acute fracture or dislocation. Electronically Signed   By:  Agustin Cree M.D.   On: 06/08/2022 10:34   DG Ankle 2 Views Left  Result Date: 06/08/2022 CLINICAL DATA:  Fall with abrasion to the left lateral foot and ankle associated with swelling EXAM: LEFT FOOT - 2 VIEW; LEFT ANKLE - 2 VIEW COMPARISON:  Left ankle radiograph dated 04/26/2021 FINDINGS: There are no findings of fracture or dislocation. No joint effusion. Degenerative changes of the foot. Ankle mortise is intact. Soft tissues are unremarkable. IMPRESSION: No acute fracture or dislocation. Electronically Signed   By: Agustin Cree M.D.   On: 06/08/2022 10:34   CT HEAD WO CONTRAST ( )  Result Date: 06/08/2022 CLINICAL DATA:  Unwitnessed fall. Large hematoma above the right eyebrow. Vomiting in triage. EXAM: CT HEAD WITHOUT CONTRAST CT CERVICAL SPINE WITHOUT CONTRAST TECHNIQUE: Multidetector CT imaging of the head and cervical spine was performed following the standard protocol without intravenous contrast. Multiplanar CT image reconstructions of the cervical spine were also generated. RADIATION DOSE REDUCTION: This exam was performed according to the departmental dose-optimization program which includes automated exposure control, adjustment of the mA and/or kV according to patient size and/or use of iterative reconstruction technique. COMPARISON:  MRI head 05/18/2022 and MRI cervical spine 12/30/2021; CT head and cervical spine 12/30/2021 FINDINGS: CT HEAD FINDINGS Brain: No intracranial hemorrhage, mass effect, or evidence of acute infarct. No hydrocephalus. No extra-axial fluid collection. Generalized cerebral atrophy. Ill-defined hypoattenuation within the cerebral white matter is nonspecific but consistent with chronic small vessel ischemic disease. Chronic left cerebellar and right frontal lobe infarcts. Chronic lacunar infarct left basal ganglia. Vascular: No hyperdense vessel. Intracranial  arterial calcification. Skull: Large hematoma about the right forehead. No underlying calvarial fracture. Sinuses/Orbits: No acute finding. Mild mucosal thickening left sphenoid sinus. Opacification of a few right mastoid air cells. Other: None. CT CERVICAL SPINE FINDINGS Alignment: No evidence of traumatic malalignment. Unchanged multilevel grade 1 anterolisthesis of C2, C3, C4 and C7. Skull base and vertebrae: No acute fracture. Redemonstrated type 2 dens fracture and mildly displaced bilateral posterior arch fractures of C1. The dens fracture appears slightly more distracted and angulated posteriorly. No evidence of spinal canal narrowing. No acute fracture. Soft tissues and spinal canal: No prevertebral fluid or swelling. No visible canal hematoma. Disc levels: Advanced multilevel spondylosis, disc space height loss, degenerative endplate changes greatest at C5-C6 and C6-C7. Multilevel advanced facet arthropathy. Posterior disc osteophyte complexes C5-C6 and C6-C7 cause mild-moderate effacement of the ventral thecal sac. Uncovertebral spurring and facet arthropathy cause advanced multilevel neural foraminal narrowing. Upper chest: No acute abnormality. Other: None. IMPRESSION: 1. No acute intracranial abnormality. 2. Large scalp hematoma about the right forehead. No calvarial fracture. 3. No acute fracture in the cervical spine. Redemonstrated type 2 dens fracture and bilateral posterior arch of C1 fractures. Electronically Signed   By: Minerva Fester M.D.   On: 06/08/2022 02:06   CT Cervical Spine Wo Contrast  Result Date: 06/08/2022 CLINICAL DATA:  Unwitnessed fall. Large hematoma above the right eyebrow. Vomiting in triage. EXAM: CT HEAD WITHOUT CONTRAST CT CERVICAL SPINE WITHOUT CONTRAST TECHNIQUE: Multidetector CT imaging of the head and cervical spine was performed following the standard protocol without intravenous contrast. Multiplanar CT image reconstructions of the cervical spine were also  generated. RADIATION DOSE REDUCTION: This exam was performed according to the departmental dose-optimization program which includes automated exposure control, adjustment of the mA and/or kV according to patient size and/or use of iterative reconstruction technique. COMPARISON:  MRI head 05/18/2022 and MRI cervical spine 12/30/2021; CT head  and cervical spine 12/30/2021 FINDINGS: CT HEAD FINDINGS Brain: No intracranial hemorrhage, mass effect, or evidence of acute infarct. No hydrocephalus. No extra-axial fluid collection. Generalized cerebral atrophy. Ill-defined hypoattenuation within the cerebral white matter is nonspecific but consistent with chronic small vessel ischemic disease. Chronic left cerebellar and right frontal lobe infarcts. Chronic lacunar infarct left basal ganglia. Vascular: No hyperdense vessel. Intracranial arterial calcification. Skull: Large hematoma about the right forehead. No underlying calvarial fracture. Sinuses/Orbits: No acute finding. Mild mucosal thickening left sphenoid sinus. Opacification of a few right mastoid air cells. Other: None. CT CERVICAL SPINE FINDINGS Alignment: No evidence of traumatic malalignment. Unchanged multilevel grade 1 anterolisthesis of C2, C3, C4 and C7. Skull base and vertebrae: No acute fracture. Redemonstrated type 2 dens fracture and mildly displaced bilateral posterior arch fractures of C1. The dens fracture appears slightly more distracted and angulated posteriorly. No evidence of spinal canal narrowing. No acute fracture. Soft tissues and spinal canal: No prevertebral fluid or swelling. No visible canal hematoma. Disc levels: Advanced multilevel spondylosis, disc space height loss, degenerative endplate changes greatest at C5-C6 and C6-C7. Multilevel advanced facet arthropathy. Posterior disc osteophyte complexes C5-C6 and C6-C7 cause mild-moderate effacement of the ventral thecal sac. Uncovertebral spurring and facet arthropathy cause advanced  multilevel neural foraminal narrowing. Upper chest: No acute abnormality. Other: None. IMPRESSION: 1. No acute intracranial abnormality. 2. Large scalp hematoma about the right forehead. No calvarial fracture. 3. No acute fracture in the cervical spine. Redemonstrated type 2 dens fracture and bilateral posterior arch of C1 fractures. Electronically Signed   By: Minerva Fester M.D.   On: 06/08/2022 02:06    Assessment/Plan Goals of care, counseling/discussion  Recurrent UTI  Recurrent falls  Protein-calorie malnutrition, moderate (HCC)  DNR (do not resuscitate)  History of CVA (cerebrovascular accident)  Amnestic MCI (mild cognitive impairment with memory loss) Patient with complex health changes in the last month leading to severe memory deficits. She has had changes in her memory, decline in appetite, weight loss (10 lbs in 1 month), recurrent UTI (3 in the last month). Based on conversation outlined above, plan to care for patient in the facility. Discussed the importance of maintaining comfort and seeking palliative measures rather than curative measures.    Family/ staff Communication: nursing, Merleen Picazo  Labs/tests ordered:  none  I spent 37 min for the care of this patient with >16 minutes discusssing goals of care and the remainder in face to face time, care coordination, and chart review.

## 2022-07-07 ENCOUNTER — Ambulatory Visit: Payer: Medicare Other | Admitting: Gastroenterology

## 2022-07-13 ENCOUNTER — Non-Acute Institutional Stay (SKILLED_NURSING_FACILITY): Payer: Medicare Other | Admitting: Nurse Practitioner

## 2022-07-13 ENCOUNTER — Encounter: Payer: Self-pay | Admitting: Nurse Practitioner

## 2022-07-13 DIAGNOSIS — R296 Repeated falls: Secondary | ICD-10-CM

## 2022-07-13 DIAGNOSIS — I10 Essential (primary) hypertension: Secondary | ICD-10-CM | POA: Diagnosis not present

## 2022-07-13 DIAGNOSIS — R519 Headache, unspecified: Secondary | ICD-10-CM

## 2022-07-13 DIAGNOSIS — E440 Moderate protein-calorie malnutrition: Secondary | ICD-10-CM

## 2022-07-13 DIAGNOSIS — G3184 Mild cognitive impairment, so stated: Secondary | ICD-10-CM

## 2022-07-13 DIAGNOSIS — Z8673 Personal history of transient ischemic attack (TIA), and cerebral infarction without residual deficits: Secondary | ICD-10-CM

## 2022-07-13 DIAGNOSIS — E785 Hyperlipidemia, unspecified: Secondary | ICD-10-CM

## 2022-07-13 NOTE — Progress Notes (Signed)
Location:  Other [facility].  Nursing Home Room Number: [facility] [address] Place of Service:  SNF ([phone]) Abbey Chatters, NP  PCP: Danella Penton, MD  Patient Care Team: Danella Penton, MD as PCP - General (Internal Medicine) Mariah Milling Tollie Pizza, MD as Consulting Physician (Cardiology)  Extended Emergency Contact Information Primary Emergency Contact: Montellano,james Sr. Home Phone: [phone] Mobile Phone: [phone] Relation: Spouse Secondary Emergency Contact: Asa Lente Home Phone: [phone] Mobile Phone: [phone] Relation: Son  Goals of care: Advanced Directive information    07/13/2022   12:33 PM  Advanced Directives  Does Patient Have a Medical Advance Directive? Yes  Type of Advance Directive Out of facility DNR (pink MOST or yellow form)  Does patient want to make changes to medical advance directive? No - Patient declined     Chief Complaint  Patient presents with   Medical Management of Chronic Issues    Medical Management of Chronic Issues.     HPI:  Pt is a 89 y.o. female seen today for medical management of chronic disease.  Pt at [facility] after CVA.  Reports she is able to feed herself. No Nausea or vomiting.  Moving bowels well.   Continues on lipitor and ASA   Blood pressure well controlled on norvasc and labetolol   GERD- controlled on protonix.   No anxiety or depression on zoloft.   Using flomax due to urinary retention, denies urinary issues at this time.   She has had weight loss- husband tries to encourage her and brings her food that she likes.  She is on supplement.   Headaches has improved, tylenol helps if she has one.   She continues to have frequent falls because she thinks she can walk but unable due to generalized weakness.   Past Medical History:  Diagnosis Date   Actinic keratosis    Breast cancer (HCC)    C1 cervical fracture (HCC)    Hypertension    Osteoarthritis    Past Surgical History:  Procedure  Laterality Date   BACK SURGERY     BIOPSY  05/03/2022   Procedure: BIOPSY;  Surgeon: Benancio Deeds, MD;  Location: [facility] ENDOSCOPY;  Service: Gastroenterology;;   ESOPHAGOGASTRODUODENOSCOPY (EGD) WITH PROPOFOL N/A 05/03/2022   Procedure: ESOPHAGOGASTRODUODENOSCOPY (EGD) WITH PROPOFOL;  Surgeon: Benancio Deeds, MD;  Location: [facility] ENDOSCOPY;  Service: Gastroenterology;  Laterality: N/A;   HEMOSTASIS CONTROL  05/03/2022   Procedure: HEMOSTASIS CONTROL;  Surgeon: Benancio Deeds, MD;  Location: [facility] ENDOSCOPY;  Service: Gastroenterology;;   IR INJECT/THERA/INC NEEDLE/CATH/PLC EPI/LUMB/SAC [facility]  04/29/2021   MOHS SURGERY      Allergies  Allergen Reactions   Morphine     Other reaction(s): Other (See Comments) Apnea with large dose Respiratory Distress    Buprenorphine Hcl     Other reaction(s): Other (See Comments), Other (See Comments) Respiratory Distress Respiratory Distress    Codeine    Epinephrine     Other reaction(s): Other (See Comments) Severe anxiety; "makes my heart race" - would like to avoid during surgery   Morphine And Related Other (See Comments)    Respiratory Distress    Percocet [Oxycodone-Acetaminophen]     Outpatient Encounter Medications as of 07/13/2022  Medication Sig   acetaminophen (TYLENOL) 325 MG tablet Take 2 tablets (650 mg total) by mouth every 6 (six) hours as needed for headache.   amLODipine (NORVASC) 5 MG tablet Take 1 tablet (5 mg total) by mouth daily.   aspirin EC 81 MG tablet  Take 1 tablet (81 mg total) by mouth daily. Swallow whole.   atorvastatin (LIPITOR) 40 MG tablet Take 1 tablet (40 mg total) by mouth daily.   Baclofen 5 MG TABS Take 1 tablet (5 mg total) by mouth 2 (two) times daily as needed (HEADACHES AND HICCUPS).   cholecalciferol (VITAMIN D) 25 MCG (1000 UNIT) tablet Take 2,000 Units by mouth daily.   Cyanocobalamin (B-12) 1000 MCG CAPS Take 2,000 mcg by mouth daily.   labetalol (NORMODYNE) 200 MG tablet Take 200 mg by  mouth 2 (two) times daily.   lactose free nutrition (BOOST) LIQD Take 237 mLs by mouth 3 (three) times daily between meals.   loperamide (IMODIUM A-D) 2 MG tablet Take 1 tablet (2 mg total) by mouth 4 (four) times daily as needed for diarrhea or loose stools.   ondansetron (ZOFRAN) 4 MG tablet Take one tablet by mouth every 6 hours as needed.   pantoprazole (PROTONIX) 40 MG tablet Take 1 tablet (40 mg total) by mouth 2 (two) times daily for 21 days, THEN 1 tablet (40 mg total) daily.   polyethylene glycol powder (GLYCOLAX/MIRALAX) 17 GM/SCOOP powder Take 17 g by mouth every 12 (twelve) hours as needed for mild constipation. And once daily in the morning scheduled   promethazine (PHENERGAN) 12.5 MG tablet Take 1 tablet (12.5 mg total) by mouth every 8 (eight) hours as needed for nausea or vomiting.   senna (SENOKOT) 8.6 MG TABS tablet Take 2 tablets by mouth at bedtime.   sertraline (ZOLOFT) 50 MG tablet Take 50 mg by mouth daily.   tamsulosin (FLOMAX) 0.4 MG CAPS capsule Take 0.4 mg by mouth daily after supper.   No facility-administered encounter medications on file as of 07/13/2022.    Review of Systems  Constitutional:  Negative for activity change, appetite change, fatigue and unexpected weight change.  HENT:  Negative for congestion and hearing loss.   Eyes: Negative.   Respiratory:  Negative for cough and shortness of breath.   Cardiovascular:  Negative for chest pain, palpitations and leg swelling.  Gastrointestinal:  Negative for abdominal pain, constipation and diarrhea.  Genitourinary:  Negative for difficulty urinating and dysuria.  Musculoskeletal:  Negative for arthralgias and myalgias.  Skin:  Negative for color change and wound.  Neurological:  Negative for dizziness and weakness.  Psychiatric/Behavioral:  Positive for confusion. Negative for agitation and behavioral problems.      Immunization History  Administered Date(s) Administered   Influenza Inj Mdck Quad Pf  11/30/2021   Influenza, High Dose Seasonal PF 12/16/2020   Influenza-Unspecified 12/06/2011, 11/27/2012, 01/19/2014, 12/11/2014, 12/03/2015, 12/10/2016, 12/18/2019   PFIZER Comirnaty(Gray Top)Covid-19 Tri-Sucrose Vaccine 03/13/2019, 04/03/2019, 12/05/2019   Pneumococcal Conjugate-13 12/31/2016, 01/05/2017   Td (Adult),5 Lf Tetanus Toxid, Preservative Free 07/16/2019   Tdap 06/18/2011   Zoster Recombinat (Shingrix) 06/30/2016, 10/26/2017, 01/06/2018   Pertinent  Health Maintenance Due  Topic Date Due   DEXA SCAN  Never done   INFLUENZA VACCINE  09/30/2022      01/08/2022    9:00 AM 01/08/2022    7:58 PM 01/09/2022    9:00 AM 01/09/2022    8:25 PM 01/10/2022    8:50 AM  Fall Risk  (RETIRED) Patient Fall Risk Level High fall risk Moderate fall risk High fall risk High fall risk High fall risk   Functional Status Survey:    Vitals:   07/13/22 1225  BP: 111/73  Pulse: 67  Resp: 16  Temp: (!) 97.4 F (36.3 C)  SpO2: 95%  Weight: 115 lb 6.4 oz (52.3 kg)  Height: 5\' 2"  (1.575 m)   Body mass index is 21.11 kg/m. Physical Exam Constitutional:      General: She is not in acute distress.    Appearance: She is well-developed. She is not diaphoretic.  HENT:     Head: Normocephalic and atraumatic.     Mouth/Throat:     Pharynx: No oropharyngeal exudate.  Eyes:     Conjunctiva/sclera: Conjunctivae normal.     Pupils: Pupils are equal, round, and reactive to light.  Cardiovascular:     Rate and Rhythm: Normal rate and regular rhythm.     Heart sounds: Normal heart sounds.  Pulmonary:     Effort: Pulmonary effort is normal.     Breath sounds: Normal breath sounds.  Abdominal:     General: Bowel sounds are normal.     Palpations: Abdomen is soft.  Musculoskeletal:     Cervical back: Normal range of motion and neck supple.     Right lower leg: No edema.     Left lower leg: No edema.  Skin:    General: Skin is warm and dry.  Neurological:     Mental Status: She is alert.      Motor: Weakness present.     Gait: Gait abnormal.  Psychiatric:        Mood and Affect: Mood normal.     Labs reviewed: Recent Labs    04/26/22 0320 05/01/22 1251 05/07/22 1052 05/08/22 0425 05/10/22 0657 05/19/22 1028 05/27/22 0000 06/08/22 0140 06/11/22 0949 06/21/22 0000 06/28/22 0000  NA 133*   < > 134* 135   < > 138   < > 137 137 137 139  K 3.5   < > 3.5 3.8   < > 3.7   < > 4.0 3.3* 4.0 4.2  CL 97*   < > 100 103   < > 103   < > 103 105 100 102  CO2 22   < > 23 23   < > 23   < > 23 21* 26* 27*  GLUCOSE 131*   < > 142* 100*   < > 152*  --  135* 141*  --   --   BUN 19   < > 13 10   < > 25*   < > 18 17 21 21   CREATININE 0.64   < > 0.72 0.54   < > 0.49   < > 0.62 0.52 0.7 0.6  CALCIUM 8.9   < > 8.5* 8.5*   < > 9.2   < > 9.5 9.2 9.7 9.5  MG 1.4*   < > 1.9 1.8  --   --   --   --  1.2*  --   --   PHOS 3.0  --   --   --   --   --   --   --   --   --   --    < > = values in this interval not displayed.   Recent Labs    01/08/22 0327 04/25/22 0948 05/15/22 1404 06/21/22 0000  AST 15 26 23 13   ALT 14 23 15 12   ALKPHOS 52 43 62 63  BILITOT 0.4 0.9 0.5  --   PROT 5.2* 7.0 6.5  --   ALBUMIN 2.7* 4.1 3.0* 3.4*   Recent Labs    06/08/22 0140 06/09/22 0443 06/10/22 0440 06/11/22 0949 06/21/22 0000 06/28/22 0000  WBC 12.0* 7.1  --  8.1 9.2 8.4  NEUTROABS 8.2*  --   --   --  6,348.00 4,897.00  HGB 12.0 9.5*   < > 10.4* 10.4* 10.1*  HCT 38.0 29.8*   < > 32.3* 32* 32*  MCV 89.2 90.3  --  87.8  --   --   PLT 382 314  --  322 357 405*   < > = values in this interval not displayed.   No results found for: "TSH" Lab Results  Component Value Date   HGBA1C 5.6 04/26/2022   Lab Results  Component Value Date   CHOL 233 (H) 04/26/2022   HDL 53 04/26/2022   LDLCALC 122 (H) 04/26/2022   LDLDIRECT 114 (H) 04/26/2022   TRIG 292 (H) 04/26/2022   CHOLHDL 4.4 04/26/2022    Significant Diagnostic Results in last 30 days:  CT Cervical Spine Wo Contrast  Result Date:  06/19/2022 CLINICAL DATA:  89 year old female status post fall. Chronic ununited type 2 odontoid fracture. EXAM: CT CERVICAL SPINE WITHOUT CONTRAST TECHNIQUE: Multidetector CT imaging of the cervical spine was performed without intravenous contrast. Multiplanar CT image reconstructions were also generated. RADIATION DOSE REDUCTION: This exam was performed according to the departmental dose-optimization program which includes automated exposure control, adjustment of the mA and/or kV according to patient size and/or use of iterative reconstruction technique. COMPARISON:  Head CT today. Recent brain MRI, CTA head and neck. Cervical spine CT 06/08/2022. FINDINGS: Alignment: Stable. Multilevel degenerative appearing mild spondylolisthesis including C2-C3, C4-C5, C7-T1, also T1-T2. Bilateral posterior element alignment is within normal limits. Skull base and vertebrae: No acute osseous abnormality identified. Chronic unhealed bilateral lateral C1 ring fractures, type 2 odontoid fracture. Stable craniocervical alignment. Underlying widespread chronic cervical facet ankylosis. Soft tissues and spinal canal: No prevertebral fluid or swelling. No visible canal hematoma. Stable noncontrast visible neck soft tissues. Disc levels:  Stable. Upper chest: Chronic T1 mild anterior wedge compression fracture appears stable. Noncontrast visible upper chest is stable. IMPRESSION: 1. No acute traumatic injury identified in the cervical spine. 2. Chronic unhealed C1 ring and type 2 odontoid fractures. Stable craniocervical and cervical vertebral alignment. Chronic cervical facet ankylosis. 3. Chronic T1 mild anterior wedge compression fracture. Electronically Signed   By: Odessa Fleming M.D.   On: 06/19/2022 06:19   CT HEAD WO CONTRAST ( )  Result Date: 06/19/2022 CLINICAL DATA:  89 year old female status post fall. EXAM: CT HEAD WITHOUT CONTRAST TECHNIQUE: Contiguous axial images were obtained from the base of the skull through the  vertex without intravenous contrast. RADIATION DOSE REDUCTION: This exam was performed according to the departmental dose-optimization program which includes automated exposure control, adjustment of the mA and/or kV according to patient size and/or use of iterative reconstruction technique. COMPARISON:  Recent Brain MRI 06/10/2022. CTA head and neck 06/10/2022. And earlier. FINDINGS: Brain: Multifocal chronic encephalomalacia, cerebral white matter disease described recently by MRI is stable. No midline shift, ventriculomegaly, mass effect, evidence of mass lesion, intracranial hemorrhage or evidence of cortically based acute infarction. Vascular: Calcified atherosclerosis at the skull base. No suspicious intracranial vascular hyperdensity. Skull: Stable.  No acute osseous abnormality identified. Sinuses/Orbits: Unchanged left sphenoid sinus mucosal thickening, right mastoid effusion. Other paranasal sinuses remain well aerated. Other: Unresolved broad-based right forehead, supraorbital scalp hematoma. Smaller new contralateral left anterior convexity scalp hematoma on series 3, image 48. No underlying calvarium fracture. IMPRESSION: 1. Small new left convexity scalp hematoma. Unresolved larger right forehead, supraorbital scalp hematoma. No underlying calvarium fracture. 2. Stable non contrast CT appearance  of the brain. No acute intracranial abnormality. Electronically Signed   By: Odessa Fleming M.D.   On: 06/19/2022 06:16    Assessment/Plan 1. Protein-calorie malnutrition, moderate (HCC) -ongoing, encouraged to liberalize diet. To have protein supplement in addition to smallest meal of the day.  2. History of CVA (cerebrovascular accident) -stable, continues on statin and ASA  3. Amnestic MCI (mild cognitive impairment with memory loss) -ongoing decline in memory, continues with supportive care of family and staff  4. Hypertension, unspecified type -Blood pressure well controlled, goal bp  <140/90 Continue current medications and dietary modifications follow metabolic panel  5. Nonintractable headache, unspecified chronicity pattern, unspecified headache type Improved at this time.   6. Hyperlipidemia, unspecified hyperlipidemia type Continues on statin   7. Recurrent falls Generalized weakness, has declined working with pt on multiple occasions.  Continue fall precautions per staff.   Janene Harvey. Biagio Borg [facility] [phone]

## 2022-08-16 ENCOUNTER — Non-Acute Institutional Stay (SKILLED_NURSING_FACILITY): Payer: Medicare Other | Admitting: Student

## 2022-08-16 ENCOUNTER — Encounter: Payer: Self-pay | Admitting: Student

## 2022-08-16 DIAGNOSIS — S065XAA Traumatic subdural hemorrhage with loss of consciousness status unknown, initial encounter: Secondary | ICD-10-CM | POA: Diagnosis not present

## 2022-08-16 DIAGNOSIS — Z66 Do not resuscitate: Secondary | ICD-10-CM

## 2022-08-16 DIAGNOSIS — I69319 Unspecified symptoms and signs involving cognitive functions following cerebral infarction: Secondary | ICD-10-CM

## 2022-08-16 DIAGNOSIS — S3210XA Unspecified fracture of sacrum, initial encounter for closed fracture: Secondary | ICD-10-CM

## 2022-08-16 DIAGNOSIS — R296 Repeated falls: Secondary | ICD-10-CM

## 2022-08-16 DIAGNOSIS — I10 Essential (primary) hypertension: Secondary | ICD-10-CM

## 2022-08-16 DIAGNOSIS — E440 Moderate protein-calorie malnutrition: Secondary | ICD-10-CM

## 2022-08-16 NOTE — Progress Notes (Unsigned)
Location:  [name] Room Number: [place]: NO/116/A Place of Service:  SNF (31) Provider: Earnestine Mealing, MD  Patient Care Team: Danella Penton, MD as PCP - General (Internal Medicine) Mariah Milling Tollie Pizza, MD as Consulting Physician (Cardiology)  Extended Emergency Contact Information Primary Emergency Contact: Brancato,james Sr. Home Phone: [phone] Mobile Phone: [phone] Relation: Spouse Secondary Emergency Contact: Asa Lente Home Phone: [phone] Mobile Phone: [phone] Relation: Son  Code Status:  DNR Goals of care: Advanced Directive information    08/16/2022   10:31 AM  Advanced Directives  Does Patient Have a Medical Advance Directive? Yes  Type of Advance Directive Out of facility DNR (pink MOST or yellow form)  Does patient want to make changes to medical advance directive? No - Patient declined     Chief Complaint  Patient presents with   Medical Management of Chronic Issues    Patient is here for a follow up for chronic conditions Elevated BP need 2nd reading for care gap metric    Quality Metric Gaps     Patient is due for AWV as well as updated covid booster, pneumonia vaccine and dexa scan     HPI:  Pt is a 89 y.o. female seen today for an acute visit for    Past Medical History:  Diagnosis Date   Actinic keratosis    Breast cancer (HCC)    C1 cervical fracture (HCC)    Hypertension    Osteoarthritis    Past Surgical History:  Procedure Laterality Date   BACK SURGERY     BIOPSY  05/03/2022   Procedure: BIOPSY;  Surgeon: Benancio Deeds, MD;  Location: [facility] ENDOSCOPY;  Service: Gastroenterology;;   ESOPHAGOGASTRODUODENOSCOPY (EGD) WITH PROPOFOL N/A 05/03/2022   Procedure: ESOPHAGOGASTRODUODENOSCOPY (EGD) WITH PROPOFOL;  Surgeon: Benancio Deeds, MD;  Location: [facility] ENDOSCOPY;  Service: Gastroenterology;  Laterality: N/A;   HEMOSTASIS CONTROL  05/03/2022   Procedure: HEMOSTASIS CONTROL;  Surgeon:  Benancio Deeds, MD;  Location: [facility] ENDOSCOPY;  Service: Gastroenterology;;   IR INJECT/THERA/INC NEEDLE/CATH/PLC EPI/LUMB/SAC [facility]  04/29/2021   MOHS SURGERY      Allergies  Allergen Reactions   Morphine     Other reaction(s): Other (See Comments) Apnea with large dose Respiratory Distress    Buprenorphine Hcl     Other reaction(s): Other (See Comments), Other (See Comments) Respiratory Distress Respiratory Distress    Codeine    Epinephrine     Other reaction(s): Other (See Comments) Severe anxiety; "makes my heart race" - would like to avoid during surgery   Morphine And Codeine Other (See Comments)    Respiratory Distress    Percocet [Oxycodone-Acetaminophen]     Outpatient Encounter Medications as of 08/16/2022  Medication Sig   acetaminophen (TYLENOL) 325 MG tablet Take 2 tablets (650 mg total) by mouth every 6 (six) hours as needed for headache.   amLODipine (NORVASC) 5 MG tablet Take 1 tablet (5 mg total) by mouth daily.   aspirin EC 81 MG tablet Take 1 tablet (81 mg total) by mouth daily. Swallow whole.   atorvastatin (LIPITOR) 40 MG tablet Take 1 tablet (40 mg total) by mouth daily.   Baclofen 5 MG TABS Take 1 tablet (5 mg total) by mouth 2 (two) times daily as needed (HEADACHES AND HICCUPS).   cholecalciferol (VITAMIN D) 25 MCG (1000 UNIT) tablet Take 2,000 Units by mouth daily.   Cyanocobalamin (B-12) 1000 MCG CAPS Take 2,000 mcg by mouth daily.   labetalol (NORMODYNE) 200 MG tablet Take  200 mg by mouth 2 (two) times daily.   lactose free nutrition (BOOST) LIQD Take 237 mLs by mouth 3 (three) times daily between meals.   loperamide (IMODIUM A-D) 2 MG tablet Take 1 tablet (2 mg total) by mouth 4 (four) times daily as needed for diarrhea or loose stools.   ondansetron (ZOFRAN) 4 MG tablet Take one tablet by mouth every 6 hours as needed.   pantoprazole (PROTONIX) 40 MG tablet Take 1 tablet (40 mg total) by mouth 2 (two) times daily for 21 days, THEN 1 tablet (40 mg  total) daily.   polyethylene glycol powder (GLYCOLAX/MIRALAX) 17 GM/SCOOP powder Take 17 g by mouth every 12 (twelve) hours as needed for mild constipation. And once daily in the morning scheduled   promethazine (PHENERGAN) 12.5 MG tablet Take 1 tablet (12.5 mg total) by mouth every 8 (eight) hours as needed for nausea or vomiting.   senna (SENOKOT) 8.6 MG TABS tablet Take 2 tablets by mouth at bedtime.   sertraline (ZOLOFT) 50 MG tablet Take 50 mg by mouth daily.   tamsulosin (FLOMAX) 0.4 MG CAPS capsule Take 0.4 mg by mouth daily after supper.   No facility-administered encounter medications on file as of 08/16/2022.    Review of Systems  Immunization History  Administered Date(s) Administered   Influenza Inj Mdck Quad Pf 11/30/2021   Influenza, High Dose Seasonal PF 12/16/2020   Influenza-Unspecified 12/06/2011, 11/27/2012, 01/19/2014, 12/11/2014, 12/03/2015, 12/10/2016, 12/18/2019   PFIZER Comirnaty(Gray Top)Covid-19 Tri-Sucrose Vaccine 03/13/2019, 04/03/2019, 12/05/2019   Pneumococcal Conjugate-13 12/31/2016, 01/05/2017   Td (Adult),5 Lf Tetanus Toxid, Preservative Free 07/16/2019   Tdap 06/18/2011   Zoster Recombinat (Shingrix) 06/30/2016, 10/26/2017, 01/06/2018   Pertinent  Health Maintenance Due  Topic Date Due   DEXA SCAN  Never done   INFLUENZA VACCINE  09/30/2022      01/08/2022    7:58 PM 01/09/2022    9:00 AM 01/09/2022    8:25 PM 01/10/2022    8:50 AM 08/16/2022   10:39 AM  Fall Risk  Falls in the past year?     1  Was there an injury with Fall?     1  Fall Risk Category Calculator     3  (RETIRED) Patient Fall Risk Level Moderate fall risk High fall risk High fall risk High fall risk   Patient at Risk for Falls Due to     History of fall(s);Impaired balance/gait;Impaired mobility  Fall risk Follow up     Falls evaluation completed   Functional Status Survey:    Vitals:   08/16/22 1050  BP: (!) 158/68  Pulse: 67  Resp: 16  Temp: (!) 97.5 F (36.4 C)   SpO2: 94%  Weight: 124 lb 4.8 oz (56.4 kg)  Height: 5\' 2"  (1.575 m)   Body mass index is 22.73 kg/m. Physical Exam  Labs reviewed: Recent Labs    04/26/22 0320 05/01/22 1251 05/07/22 1052 05/08/22 0425 05/10/22 0657 05/19/22 1028 05/27/22 0000 06/08/22 0140 06/11/22 0949 06/21/22 0000 06/28/22 0000  NA 133*   < > 134* 135   < > 138   < > 137 137 137 139  K 3.5   < > 3.5 3.8   < > 3.7   < > 4.0 3.3* 4.0 4.2  CL 97*   < > 100 103   < > 103   < > 103 105 100 102  CO2 22   < > 23 23   < > 23   < >  23 21* 26* 27*  GLUCOSE 131*   < > 142* 100*   < > 152*  --  135* 141*  --   --   BUN 19   < > 13 10   < > 25*   < > 18 17 21 21   CREATININE 0.64   < > 0.72 0.54   < > 0.49   < > 0.62 0.52 0.7 0.6  CALCIUM 8.9   < > 8.5* 8.5*   < > 9.2   < > 9.5 9.2 9.7 9.5  MG 1.4*   < > 1.9 1.8  --   --   --   --  1.2*  --   --   PHOS 3.0  --   --   --   --   --   --   --   --   --   --    < > = values in this interval not displayed.   Recent Labs    01/08/22 0327 04/25/22 0948 05/15/22 1404 06/21/22 0000  AST 15 26 23 13   ALT 14 23 15 12   ALKPHOS 52 43 62 63  BILITOT 0.4 0.9 0.5  --   PROT 5.2* 7.0 6.5  --   ALBUMIN 2.7* 4.1 3.0* 3.4*   Recent Labs    06/08/22 0140 06/09/22 0443 06/10/22 0440 06/11/22 0949 06/21/22 0000 06/28/22 0000  WBC 12.0* 7.1  --  8.1 9.2 8.4  NEUTROABS 8.2*  --   --   --  6,348.00 4,897.00  HGB 12.0 9.5*   < > 10.4* 10.4* 10.1*  HCT 38.0 29.8*   < > 32.3* 32* 32*  MCV 89.2 90.3  --  87.8  --   --   PLT 382 314  --  322 357 405*   < > = values in this interval not displayed.   No results found for: "TSH" Lab Results  Component Value Date   HGBA1C 5.6 04/26/2022   Lab Results  Component Value Date   CHOL 233 (H) 04/26/2022   HDL 53 04/26/2022   LDLCALC 122 (H) 04/26/2022   LDLDIRECT 114 (H) 04/26/2022   TRIG 292 (H) 04/26/2022   CHOLHDL 4.4 04/26/2022    Significant Diagnostic Results in last 30 days:  No results  found.  Assessment/Plan There are no diagnoses linked to this encounter.   Family/ staff Communication: ***  Labs/tests ordered:  ***

## 2022-08-17 ENCOUNTER — Encounter: Payer: Self-pay | Admitting: Student

## 2022-08-17 DIAGNOSIS — S065XAA Traumatic subdural hemorrhage with loss of consciousness status unknown, initial encounter: Secondary | ICD-10-CM | POA: Insufficient documentation

## 2022-08-26 ENCOUNTER — Encounter: Payer: Self-pay | Admitting: Nurse Practitioner

## 2022-08-26 NOTE — Progress Notes (Signed)
error 

## 2022-08-26 NOTE — Progress Notes (Signed)
er  This encounter was created in error - please disregard.

## 2022-08-26 NOTE — Progress Notes (Deleted)
Location:   [facility] Room Number: 318 A Place of Service:  SNF [phone]) Provider:  Abbey Chatters, NP  Earnestine Mealing, MD  Patient Care Team: Earnestine Mealing, MD as PCP - General (Family Medicine) Antonieta Iba, MD as Consulting Physician (Cardiology)  Extended Emergency Contact Information Primary Emergency Contact: Cuen,james Sr. Home Phone: [phone] Mobile Phone: [phone] Relation: Spouse Secondary Emergency Contact: Asa Lente Home Phone: [phone] Mobile Phone: [phone] Relation: Son  Code Status:  DNR Goals of care: Advanced Directive information    08/26/2022    3:00 PM  Advanced Directives  Does Patient Have a Medical Advance Directive? Yes  Type of Advance Directive Out of facility DNR (pink MOST or yellow form)  Does patient want to make changes to medical advance directive? No - Patient declined     Chief Complaint  Patient presents with   Medical Management of Chronic Issues    Routine follow up   Immunizations    Current COVID booster due    HPI:  Pt is a 89 y.o. female seen today for medical management of chronic diseases.     Past Medical History:  Diagnosis Date   Actinic keratosis    Breast cancer (HCC)    C1 cervical fracture (HCC)    Hypertension    Osteoarthritis    Past Surgical History:  Procedure Laterality Date   BACK SURGERY     BIOPSY  05/03/2022   Procedure: BIOPSY;  Surgeon: Benancio Deeds, MD;  Location: [facility] ENDOSCOPY;  Service: Gastroenterology;;   ESOPHAGOGASTRODUODENOSCOPY (EGD) WITH PROPOFOL N/A 05/03/2022   Procedure: ESOPHAGOGASTRODUODENOSCOPY (EGD) WITH PROPOFOL;  Surgeon: Benancio Deeds, MD;  Location: [facility] ENDOSCOPY;  Service: Gastroenterology;  Laterality: N/A;   HEMOSTASIS CONTROL  05/03/2022   Procedure: HEMOSTASIS CONTROL;  Surgeon: Benancio Deeds, MD;  Location: [facility] ENDOSCOPY;  Service: Gastroenterology;;   IR INJECT/THERA/INC  NEEDLE/CATH/PLC EPI/LUMB/SAC [facility]  04/29/2021   MOHS SURGERY      Allergies  Allergen Reactions   Morphine     Other reaction(s): Other (See Comments) Apnea with large dose Respiratory Distress    Buprenorphine Hcl     Other reaction(s): Other (See Comments), Other (See Comments) Respiratory Distress Respiratory Distress    Codeine    Epinephrine     Other reaction(s): Other (See Comments) Severe anxiety; "makes my heart race" - would like to avoid during surgery   Morphine And Codeine Other (See Comments)    Respiratory Distress    Percocet [Oxycodone-Acetaminophen]     Allergies as of 08/26/2022       Reactions   Morphine    Other reaction(s): Other (See Comments) Apnea with large dose Respiratory Distress   Buprenorphine Hcl    Other reaction(s): Other (See Comments), Other (See Comments) Respiratory Distress Respiratory Distress   Codeine    Epinephrine    Other reaction(s): Other (See Comments) Severe anxiety; "makes my heart race" - would like to avoid during surgery   Morphine And Codeine Other (See Comments)   Respiratory Distress   Percocet [oxycodone-acetaminophen]         Medication List        Accurate as of August 26, 2022  3:08 PM. If you have any questions, ask your nurse or doctor.          acetaminophen 325 MG tablet Commonly known as: TYLENOL Take 2 tablets (650 mg total) by mouth every 6 (six) hours as needed for headache.   amLODipine 5  MG tablet Commonly known as: NORVASC Take 1 tablet (5 mg total) by mouth daily.   aspirin EC 81 MG tablet Take 1 tablet (81 mg total) by mouth daily. Swallow whole.   atorvastatin 40 MG tablet Commonly known as: LIPITOR Take 1 tablet (40 mg total) by mouth daily.   B-12 1000 MCG Caps Take 2,000 mcg by mouth daily.   Baclofen 5 MG Tabs Take 1 tablet (5 mg total) by mouth 2 (two) times daily as needed (HEADACHES AND HICCUPS).   cholecalciferol 25 MCG (1000 UNIT) tablet Commonly known as:  VITAMIN D3 Take 2,000 Units by mouth daily.   labetalol 200 MG tablet Commonly known as: NORMODYNE Take 200 mg by mouth 2 (two) times daily.   lactose free nutrition Liqd Take 237 mLs by mouth 3 (three) times daily between meals.   loperamide 2 MG tablet Commonly known as: Imodium A-D Take 1 tablet (2 mg total) by mouth 4 (four) times daily as needed for diarrhea or loose stools. What changed: when to take this   ondansetron 4 MG tablet Commonly known as: ZOFRAN Take one tablet by mouth every 6 hours as needed.   pantoprazole 40 MG tablet Commonly known as: PROTONIX Take 1 tablet (40 mg total) by mouth 2 (two) times daily for 21 days, THEN 1 tablet (40 mg total) daily. Start taking on: May 12, 2022   polyethylene glycol powder 17 GM/SCOOP powder Commonly known as: GLYCOLAX/MIRALAX Take 17 g by mouth every 12 (twelve) hours as needed for mild constipation. And once daily in the morning scheduled   promethazine 12.5 MG tablet Commonly known as: PHENERGAN Take 1 tablet (12.5 mg total) by mouth every 8 (eight) hours as needed for nausea or vomiting.   senna 8.6 MG Tabs tablet Commonly known as: SENOKOT Take 2 tablets by mouth at bedtime.   sertraline 50 MG tablet Commonly known as: ZOLOFT Take 50 mg by mouth daily.   tamsulosin 0.4 MG Caps capsule Commonly known as: FLOMAX Take 0.4 mg by mouth every evening.        Review of Systems  Immunization History  Administered Date(s) Administered   Influenza Inj Mdck Quad Pf 11/30/2021   Influenza, High Dose Seasonal PF 12/16/2020   Influenza-Unspecified 12/06/2011, 11/27/2012, 01/19/2014, 12/11/2014, 12/03/2015, 12/10/2016, 12/18/2019   PFIZER Comirnaty(Gray Top)Covid-19 Tri-Sucrose Vaccine 03/13/2019, 04/03/2019, 12/05/2019   Pneumococcal Conjugate-13 12/31/2016, 01/05/2017   Pneumococcal Polysaccharide-23 01/05/2018   Td (Adult),5 Lf Tetanus Toxid, Preservative Free 07/16/2019   Tdap 06/18/2011   Zoster Recombinat  (Shingrix) 06/30/2016, 10/26/2017, 01/06/2018   Pertinent  Health Maintenance Due  Topic Date Due   INFLUENZA VACCINE  09/30/2022   DEXA SCAN  Completed      01/08/2022    7:58 PM 01/09/2022    9:00 AM 01/09/2022    8:25 PM 01/10/2022    8:50 AM 08/16/2022   10:39 AM  Fall Risk  Falls in the past year?     1  Was there an injury with Fall?     1  Fall Risk Category Calculator     3  (RETIRED) Patient Fall Risk Level Moderate fall risk High fall risk High fall risk High fall risk   Patient at Risk for Falls Due to     History of fall(s);Impaired balance/gait;Impaired mobility  Fall risk Follow up     Falls evaluation completed   Functional Status Survey:    Vitals:   08/26/22 1458  BP: (!) 148/64  Pulse: 63  Resp:  20  Temp: (!) 97.5 F (36.4 C)  SpO2: 94%  Weight: 127 lb 6.4 oz (57.8 kg)  Height: 5\' 2"  (1.575 m)   Body mass index is 23.3 kg/m. Physical Exam  Labs reviewed: Recent Labs    04/26/22 0320 05/01/22 1251 05/07/22 1052 05/08/22 0425 05/10/22 0657 05/19/22 1028 05/27/22 0000 06/08/22 0140 06/11/22 0949 06/21/22 0000 06/28/22 0000  NA 133*   < > 134* 135   < > 138   < > 137 137 137 139  K 3.5   < > 3.5 3.8   < > 3.7   < > 4.0 3.3* 4.0 4.2  CL 97*   < > 100 103   < > 103   < > 103 105 100 102  CO2 22   < > 23 23   < > 23   < > 23 21* 26* 27*  GLUCOSE 131*   < > 142* 100*   < > 152*  --  135* 141*  --   --   BUN 19   < > 13 10   < > 25*   < > 18 17 21 21   CREATININE 0.64   < > 0.72 0.54   < > 0.49   < > 0.62 0.52 0.7 0.6  CALCIUM 8.9   < > 8.5* 8.5*   < > 9.2   < > 9.5 9.2 9.7 9.5  MG 1.4*   < > 1.9 1.8  --   --   --   --  1.2*  --   --   PHOS 3.0  --   --   --   --   --   --   --   --   --   --    < > = values in this interval not displayed.   Recent Labs    01/08/22 0327 04/25/22 0948 05/15/22 1404 06/21/22 0000  AST 15 26 23 13   ALT 14 23 15 12   ALKPHOS 52 43 62 63  BILITOT 0.4 0.9 0.5  --   PROT 5.2* 7.0 6.5  --   ALBUMIN 2.7* 4.1  3.0* 3.4*   Recent Labs    06/08/22 0140 06/09/22 0443 06/10/22 0440 06/11/22 0949 06/21/22 0000 06/28/22 0000  WBC 12.0* 7.1  --  8.1 9.2 8.4  NEUTROABS 8.2*  --   --   --  6,348.00 4,897.00  HGB 12.0 9.5*   < > 10.4* 10.4* 10.1*  HCT 38.0 29.8*   < > 32.3* 32* 32*  MCV 89.2 90.3  --  87.8  --   --   PLT 382 314  --  322 357 405*   < > = values in this interval not displayed.   No results found for: "TSH" Lab Results  Component Value Date   HGBA1C 5.6 04/26/2022   Lab Results  Component Value Date   CHOL 233 (H) 04/26/2022   HDL 53 04/26/2022   LDLCALC 122 (H) 04/26/2022   LDLDIRECT 114 (H) 04/26/2022   TRIG 292 (H) 04/26/2022   CHOLHDL 4.4 04/26/2022    Significant Diagnostic Results in last 30 days:  No results found.  Assessment/Plan There are no diagnoses linked to this encounter.   Family/ staff Communication:   Labs/tests ordered:

## 2022-09-07 ENCOUNTER — Non-Acute Institutional Stay: Payer: Self-pay | Admitting: Nurse Practitioner

## 2022-09-07 DIAGNOSIS — F331 Major depressive disorder, recurrent, moderate: Secondary | ICD-10-CM

## 2022-09-07 MED ORDER — SERTRALINE HCL 100 MG PO TABS: 100.0000 mg | ORAL_TABLET | Freq: Every day | ORAL | Status: DC

## 2022-09-07 NOTE — Progress Notes (Signed)
Location:  Other Nursing Home Room Number: 318 Place of Service:     Earnestine Mealing, MD  Patient Care Team: Earnestine Mealing, MD as PCP - General (Family Medicine) Mariah Milling Tollie Pizza, MD as Consulting Physician (Cardiology)  Extended Emergency Contact Information Primary Emergency Contact: Lown,james Sr. Home Phone: [phone] Mobile Phone: [phone] Relation: Spouse Secondary Emergency Contact: Asa Lente Home Phone: [phone] Mobile Phone: [phone] Relation: Son  Goals of care: Advanced Directive information    08/26/2022    3:00 PM  Advanced Directives  Does Patient Have a Medical Advance Directive? Yes  Type of Advance Directive Out of facility DNR (pink MOST or yellow form)  Does patient want to make changes to medical advance directive? No - Patient declined     Chief Complaint  Patient presents with   Acute Visit    Depression    HPI:  Pt is a 89 y.o. female seen today for an acute visit for increase in depressive symptoms.  Over the last few days crying more. Not eating much. Not wanting to get out of bed.  More withdrawn per nursing.  Pt with hx of dementia and is a poor historian but aware she is not leaving SNF.     Past Medical History:  Diagnosis Date   Actinic keratosis    Breast cancer (HCC)    C1 cervical fracture (HCC)    Hypertension    Osteoarthritis    Past Surgical History:  Procedure Laterality Date   BACK SURGERY     BIOPSY  05/03/2022   Procedure: BIOPSY;  Surgeon: Benancio Deeds, MD;  Location: [facility] ENDOSCOPY;  Service: Gastroenterology;;   ESOPHAGOGASTRODUODENOSCOPY (EGD) WITH PROPOFOL N/A 05/03/2022   Procedure: ESOPHAGOGASTRODUODENOSCOPY (EGD) WITH PROPOFOL;  Surgeon: Benancio Deeds, MD;  Location: [facility] ENDOSCOPY;  Service: Gastroenterology;  Laterality: N/A;   HEMOSTASIS CONTROL  05/03/2022   Procedure: HEMOSTASIS CONTROL;  Surgeon: Benancio Deeds, MD;  Location: [facility] ENDOSCOPY;  Service:  Gastroenterology;;   IR INJECT/THERA/INC NEEDLE/CATH/PLC EPI/LUMB/SAC [facility]  04/29/2021   MOHS SURGERY      Allergies  Allergen Reactions   Morphine     Other reaction(s): Other (See Comments) Apnea with large dose Respiratory Distress    Buprenorphine Hcl     Other reaction(s): Other (See Comments), Other (See Comments) Respiratory Distress Respiratory Distress    Codeine    Epinephrine     Other reaction(s): Other (See Comments) Severe anxiety; "makes my heart race" - would like to avoid during surgery   Morphine And Codeine Other (See Comments)    Respiratory Distress    Percocet [Oxycodone-Acetaminophen]     Outpatient Encounter Medications as of 09/07/2022  Medication Sig   acetaminophen (TYLENOL) 325 MG tablet Take 2 tablets (650 mg total) by mouth every 6 (six) hours as needed for headache.   amLODipine (NORVASC) 5 MG tablet Take 1 tablet (5 mg total) by mouth daily.   aspirin EC 81 MG tablet Take 1 tablet (81 mg total) by mouth daily. Swallow whole.   atorvastatin (LIPITOR) 40 MG tablet Take 1 tablet (40 mg total) by mouth daily.   Baclofen 5 MG TABS Take 1 tablet (5 mg total) by mouth 2 (two) times daily as needed (HEADACHES AND HICCUPS).   cholecalciferol (VITAMIN D) 25 MCG (1000 UNIT) tablet Take 2,000 Units by mouth daily.   Cyanocobalamin (B-12) 1000 MCG CAPS Take 2,000 mcg by mouth daily.   labetalol (NORMODYNE) 200 MG tablet Take 200 mg by mouth 2 (two) times daily.  lactose free nutrition (BOOST) LIQD Take 237 mLs by mouth 3 (three) times daily between meals.   loperamide (IMODIUM A-D) 2 MG tablet Take 1 tablet (2 mg total) by mouth 4 (four) times daily as needed for diarrhea or loose stools. (Patient taking differently: Take 2 mg by mouth every 6 (six) hours as needed for diarrhea or loose stools.)   ondansetron (ZOFRAN) 4 MG tablet Take one tablet by mouth every 6 hours as needed.   pantoprazole (PROTONIX) 40 MG tablet Take 1 tablet (40 mg total) by mouth 2  (two) times daily for 21 days, THEN 1 tablet (40 mg total) daily.   polyethylene glycol powder (GLYCOLAX/MIRALAX) 17 GM/SCOOP powder Take 17 g by mouth every 12 (twelve) hours as needed for mild constipation. And once daily in the morning scheduled   promethazine (PHENERGAN) 12.5 MG tablet Take 1 tablet (12.5 mg total) by mouth every 8 (eight) hours as needed for nausea or vomiting.   senna (SENOKOT) 8.6 MG TABS tablet Take 2 tablets by mouth at bedtime.   sertraline (ZOLOFT) 100 MG tablet Take 1 tablet (100 mg total) by mouth daily.   tamsulosin (FLOMAX) 0.4 MG CAPS capsule Take 0.4 mg by mouth every evening.   [DISCONTINUED] sertraline (ZOLOFT) 50 MG tablet Take 50 mg by mouth daily.   No facility-administered encounter medications on file as of 09/07/2022.    Review of Systems  Unable to perform ROS: Dementia    Immunization History  Administered Date(s) Administered   Influenza Inj Mdck Quad Pf 11/30/2021   Influenza, High Dose Seasonal PF 12/16/2020   Influenza-Unspecified 12/06/2011, 11/27/2012, 01/19/2014, 12/11/2014, 12/03/2015, 12/10/2016, 12/18/2019   PFIZER Comirnaty(Gray Top)Covid-19 Tri-Sucrose Vaccine 03/13/2019, 04/03/2019, 12/05/2019   Pneumococcal Conjugate-13 12/31/2016, 01/05/2017   Pneumococcal Polysaccharide-23 01/05/2018   Td (Adult),5 Lf Tetanus Toxid, Preservative Free 07/16/2019   Tdap 06/18/2011   Zoster Recombinant(Shingrix) 06/30/2016, 10/26/2017, 01/06/2018   Pertinent  Health Maintenance Due  Topic Date Due   INFLUENZA VACCINE  09/30/2022   DEXA SCAN  Completed      01/08/2022    7:58 PM 01/09/2022    9:00 AM 01/09/2022    8:25 PM 01/10/2022    8:50 AM 08/16/2022   10:39 AM  Fall Risk  Falls in the past year?     1  Was there an injury with Fall?     1  Fall Risk Category Calculator     3  (RETIRED) Patient Fall Risk Level Moderate fall risk High fall risk High fall risk High fall risk   Patient at Risk for Falls Due to     History of  fall(s);Impaired balance/gait;Impaired mobility  Fall risk Follow up     Falls evaluation completed   Functional Status Survey:    Vitals:   09/07/22 1434  BP: 128/68  Pulse: 70  Resp: (!) 22  Temp: 98.4 F (36.9 C)  Weight: 126 lb 9.6 oz (57.4 kg)   Body mass index is 23.16 kg/m. Physical Exam Constitutional:      Appearance: Normal appearance.  Pulmonary:     Effort: Pulmonary effort is normal.  Neurological:     Mental Status: She is alert. Mental status is at baseline.  Psychiatric:        Mood and Affect: Mood normal.     Labs reviewed: Recent Labs    04/26/22 0320 05/01/22 1251 05/07/22 1052 05/08/22 0425 05/10/22 0657 05/19/22 1028 05/27/22 0000 06/08/22 0140 06/11/22 0949 06/21/22 0000 06/28/22 0000  NA 133*   < >  134* 135   < > 138   < > 137 137 137 139  K 3.5   < > 3.5 3.8   < > 3.7   < > 4.0 3.3* 4.0 4.2  CL 97*   < > 100 103   < > 103   < > 103 105 100 102  CO2 22   < > 23 23   < > 23   < > 23 21* 26* 27*  GLUCOSE 131*   < > 142* 100*   < > 152*  --  135* 141*  --   --   BUN 19   < > 13 10   < > 25*   < > 18 17 21 21   CREATININE 0.64   < > 0.72 0.54   < > 0.49   < > 0.62 0.52 0.7 0.6  CALCIUM 8.9   < > 8.5* 8.5*   < > 9.2   < > 9.5 9.2 9.7 9.5  MG 1.4*   < > 1.9 1.8  --   --   --   --  1.2*  --   --   PHOS 3.0  --   --   --   --   --   --   --   --   --   --    < > = values in this interval not displayed.   Recent Labs    01/08/22 0327 04/25/22 0948 05/15/22 1404 06/21/22 0000  AST 15 26 23 13   ALT 14 23 15 12   ALKPHOS 52 43 62 63  BILITOT 0.4 0.9 0.5  --   PROT 5.2* 7.0 6.5  --   ALBUMIN 2.7* 4.1 3.0* 3.4*   Recent Labs    06/08/22 0140 06/09/22 0443 06/10/22 0440 06/11/22 0949 06/21/22 0000 06/28/22 0000  WBC 12.0* 7.1  --  8.1 9.2 8.4  NEUTROABS 8.2*  --   --   --  6,348.00 4,897.00  HGB 12.0 9.5*   < > 10.4* 10.4* 10.1*  HCT 38.0 29.8*   < > 32.3* 32* 32*  MCV 89.2 90.3  --  87.8  --   --   PLT 382 314  --  322 357 405*    < > = values in this interval not displayed.   No results found for: "TSH" Lab Results  Component Value Date   HGBA1C 5.6 04/26/2022   Lab Results  Component Value Date   CHOL 233 (H) 04/26/2022   HDL 53 04/26/2022   LDLCALC 122 (H) 04/26/2022   LDLDIRECT 114 (H) 04/26/2022   TRIG 292 (H) 04/26/2022   CHOLHDL 4.4 04/26/2022    Significant Diagnostic Results in last 30 days:  No results found.  Assessment/Plan 1. Moderate episode of recurrent major depressive disorder (HCC) Will increase zoloft to 100 mg daily -have staff get her more involved in activities - sertraline (ZOLOFT) 100 MG tablet; Take 1 tablet (100 mg total) by mouth daily.  Janene Harvey. Biagio Borg [facility] [phone]

## 2022-09-17 ENCOUNTER — Telehealth: Payer: Medicare Other | Admitting: Student

## 2022-09-17 NOTE — Telephone Encounter (Signed)
Patient had a witnessed fall. Has a hematoma of the forehead and 2 skin tears. Will hold ASA. Neuro checks are stable.

## 2022-09-24 ENCOUNTER — Encounter: Payer: Self-pay | Admitting: Student

## 2022-09-24 ENCOUNTER — Non-Acute Institutional Stay (SKILLED_NURSING_FACILITY): Payer: Medicare Other | Admitting: Student

## 2022-09-24 DIAGNOSIS — Z8673 Personal history of transient ischemic attack (TIA), and cerebral infarction without residual deficits: Secondary | ICD-10-CM

## 2022-09-24 DIAGNOSIS — E440 Moderate protein-calorie malnutrition: Secondary | ICD-10-CM | POA: Diagnosis not present

## 2022-09-24 DIAGNOSIS — I10 Essential (primary) hypertension: Secondary | ICD-10-CM

## 2022-09-24 DIAGNOSIS — K279 Peptic ulcer, site unspecified, unspecified as acute or chronic, without hemorrhage or perforation: Secondary | ICD-10-CM

## 2022-09-24 DIAGNOSIS — F331 Major depressive disorder, recurrent, moderate: Secondary | ICD-10-CM

## 2022-09-24 DIAGNOSIS — I5032 Chronic diastolic (congestive) heart failure: Secondary | ICD-10-CM

## 2022-09-24 DIAGNOSIS — W19XXXA Unspecified fall, initial encounter: Secondary | ICD-10-CM

## 2022-09-24 NOTE — Progress Notes (Unsigned)
Location:  Other [place].  Nursing Home Room Number: [facility] 318A Place of Service:  SNF [phone]) Provider:  Earnestine Mealing, MD  Patient Care Team: Earnestine Mealing, MD as PCP - General (Family Medicine) Antonieta Iba, MD as Consulting Physician (Cardiology)  Extended Emergency Contact Information Primary Emergency Contact: Callander,james Sr. Home Phone: [phone] Mobile Phone: [phone] Relation: Spouse Secondary Emergency Contact: Asa Lente Home Phone: [phone] Mobile Phone: [phone] Relation: Son  Code Status:  DNR Goals of care: Advanced Directive information    09/24/2022    1:40 PM  Advanced Directives  Does Patient Have a Medical Advance Directive? Yes  Type of Advance Directive Out of facility DNR (pink MOST or yellow form)  Does patient want to make changes to medical advance directive? No - Patient declined     Chief Complaint  Patient presents with   Medical Management of Chronic Issues    Medical Management of Chronic Issues.     HPI:  Pt is a 89 y.o. female seen today for medical management of chronic diseases.    Patient is sitting beside her husband. She says she is feeling fine and continues to eat well. She asks when she can start therapy. Her husband who is beside her. He states he is happy with her current status.   Nursing state patient has intermittent moments of clarity. There are days she doesn't want to get out of bed or eat, then others where she gets around on her. She had a fall last week which led to a bruise on the forehead, but no major injuries.    Past Medical History:  Diagnosis Date   Actinic keratosis    Breast cancer (HCC)    C1 cervical fracture (HCC)    Hypertension    Osteoarthritis    Past Surgical History:  Procedure Laterality Date   BACK SURGERY     BIOPSY  05/03/2022   Procedure: BIOPSY;  Surgeon: Benancio Deeds, MD;  Location: [facility] ENDOSCOPY;  Service: Gastroenterology;;    ESOPHAGOGASTRODUODENOSCOPY (EGD) WITH PROPOFOL N/A 05/03/2022   Procedure: ESOPHAGOGASTRODUODENOSCOPY (EGD) WITH PROPOFOL;  Surgeon: Benancio Deeds, MD;  Location: [facility] ENDOSCOPY;  Service: Gastroenterology;  Laterality: N/A;   HEMOSTASIS CONTROL  05/03/2022   Procedure: HEMOSTASIS CONTROL;  Surgeon: Benancio Deeds, MD;  Location: [facility] ENDOSCOPY;  Service: Gastroenterology;;   IR INJECT/THERA/INC NEEDLE/CATH/PLC EPI/LUMB/SAC [facility]  04/29/2021   MOHS SURGERY      Allergies  Allergen Reactions   Morphine     Other reaction(s): Other (See Comments) Apnea with large dose Respiratory Distress    Buprenorphine Hcl     Other reaction(s): Other (See Comments), Other (See Comments) Respiratory Distress Respiratory Distress    Codeine    Epinephrine     Other reaction(s): Other (See Comments) Severe anxiety; "makes my heart race" - would like to avoid during surgery   Morphine And Codeine Other (See Comments)    Respiratory Distress    Percocet [Oxycodone-Acetaminophen]     Outpatient Encounter Medications as of 09/24/2022  Medication Sig   acetaminophen (TYLENOL) 500 MG tablet Take 1,000 mg by mouth every 8 (eight) hours as needed.   amLODipine (NORVASC) 5 MG tablet Take 1 tablet (5 mg total) by mouth daily.   aspirin EC 81 MG tablet Take 1 tablet (81 mg total) by mouth daily. Swallow whole.   atorvastatin (LIPITOR) 40 MG tablet Take 1 tablet (40 mg total) by mouth daily.   Baclofen 5 MG TABS Take 1 tablet (  5 mg total) by mouth 2 (two) times daily as needed (HEADACHES AND HICCUPS).   cholecalciferol (VITAMIN D) 25 MCG (1000 UNIT) tablet Take 2,000 Units by mouth daily.   Cyanocobalamin (B-12) 1000 MCG CAPS Take 2,000 mcg by mouth daily.   labetalol (NORMODYNE) 200 MG tablet Take 200 mg by mouth 2 (two) times daily.   lactose free nutrition (BOOST) LIQD Take 237 mLs by mouth 3 (three) times daily between meals.   loperamide (IMODIUM A-D) 2 MG tablet Take 1 tablet (2 mg total) by mouth  4 (four) times daily as needed for diarrhea or loose stools.   ondansetron (ZOFRAN) 4 MG tablet Take one tablet by mouth every 6 hours as needed.   pantoprazole (PROTONIX) 40 MG tablet Take 40 mg by mouth daily.   polyethylene glycol powder (GLYCOLAX/MIRALAX) 17 GM/SCOOP powder Take 17 g by mouth every 12 (twelve) hours as needed for mild constipation. And once daily in the morning scheduled   promethazine (PHENERGAN) 12.5 MG tablet Take 1 tablet (12.5 mg total) by mouth every 8 (eight) hours as needed for nausea or vomiting.   senna (SENOKOT) 8.6 MG TABS tablet Take 2 tablets by mouth at bedtime.   sertraline (ZOLOFT) 100 MG tablet Take 1 tablet (100 mg total) by mouth daily.   [DISCONTINUED] acetaminophen (TYLENOL) 325 MG tablet Take 2 tablets (650 mg total) by mouth every 6 (six) hours as needed for headache. (Patient taking differently: Take 650 mg by mouth every 8 (eight) hours as needed for headache.)   [DISCONTINUED] pantoprazole (PROTONIX) 40 MG tablet Take 1 tablet (40 mg total) by mouth 2 (two) times daily for 21 days, THEN 1 tablet (40 mg total) daily. (Patient taking differently: 1 tablet (40 mg total) daily.)   [DISCONTINUED] tamsulosin (FLOMAX) 0.4 MG CAPS capsule Take 0.4 mg by mouth every evening.   No facility-administered encounter medications on file as of 09/24/2022.    Review of Systems  Immunization History  Administered Date(s) Administered   Influenza Inj Mdck Quad Pf 11/30/2021   Influenza, High Dose Seasonal PF 12/16/2020   Influenza-Unspecified 12/06/2011, 11/27/2012, 01/19/2014, 12/11/2014, 12/03/2015, 12/10/2016, 12/18/2019   PFIZER Comirnaty(Gray Top)Covid-19 Tri-Sucrose Vaccine 03/13/2019, 04/03/2019, 12/05/2019   Pneumococcal Conjugate-13 12/31/2016, 01/05/2017   Pneumococcal Polysaccharide-23 01/05/2018   Td (Adult),5 Lf Tetanus Toxid, Preservative Free 07/16/2019   Tdap 06/18/2011   Zoster Recombinant(Shingrix) 06/30/2016, 10/26/2017, 01/06/2018    Pertinent  Health Maintenance Due  Topic Date Due   INFLUENZA VACCINE  09/30/2022   DEXA SCAN  Completed      01/08/2022    7:58 PM 01/09/2022    9:00 AM 01/09/2022    8:25 PM 01/10/2022    8:50 AM 08/16/2022   10:39 AM  Fall Risk  Falls in the past year?     1  Was there an injury with Fall?     1  Fall Risk Category Calculator     3  (RETIRED) Patient Fall Risk Level Moderate fall risk High fall risk High fall risk High fall risk   Patient at Risk for Falls Due to     History of fall(s);Impaired balance/gait;Impaired mobility  Fall risk Follow up     Falls evaluation completed   Functional Status Survey:    Vitals:   09/24/22 1333  BP: 120/67  Pulse: 76  Resp: 20  Temp: 98.4 F (36.9 C)  SpO2: 94%  Weight: 129 lb 3.2 oz (58.6 kg)  Height: 5\' 2"  (1.575 m)   Body mass index  is 23.63 kg/m. Physical Exam Constitutional:      Appearance: Normal appearance.  Cardiovascular:     Rate and Rhythm: Normal rate and regular rhythm.     Pulses: Normal pulses.     Heart sounds: Normal heart sounds.  Pulmonary:     Effort: Pulmonary effort is normal.     Breath sounds: Normal breath sounds.  Abdominal:     General: Abdomen is flat.     Palpations: Abdomen is soft.  Neurological:     General: No focal deficit present.     Mental Status: She is alert. Mental status is at baseline.     Labs reviewed: Recent Labs    04/26/22 0320 05/01/22 1251 05/07/22 1052 05/08/22 0425 05/10/22 0657 05/19/22 1028 05/27/22 0000 06/08/22 0140 06/11/22 0949 06/21/22 0000 06/28/22 0000  NA 133*   < > 134* 135   < > 138   < > 137 137 137 139  K 3.5   < > 3.5 3.8   < > 3.7   < > 4.0 3.3* 4.0 4.2  CL 97*   < > 100 103   < > 103   < > 103 105 100 102  CO2 22   < > 23 23   < > 23   < > 23 21* 26* 27*  GLUCOSE 131*   < > 142* 100*   < > 152*  --  135* 141*  --   --   BUN 19   < > 13 10   < > 25*   < > 18 17 21 21   CREATININE 0.64   < > 0.72 0.54   < > 0.49   < > 0.62 0.52 0.7 0.6   CALCIUM 8.9   < > 8.5* 8.5*   < > 9.2   < > 9.5 9.2 9.7 9.5  MG 1.4*   < > 1.9 1.8  --   --   --   --  1.2*  --   --   PHOS 3.0  --   --   --   --   --   --   --   --   --   --    < > = values in this interval not displayed.   Recent Labs    01/08/22 0327 04/25/22 0948 05/15/22 1404 06/21/22 0000  AST 15 26 23 13   ALT 14 23 15 12   ALKPHOS 52 43 62 63  BILITOT 0.4 0.9 0.5  --   PROT 5.2* 7.0 6.5  --   ALBUMIN 2.7* 4.1 3.0* 3.4*   Recent Labs    06/08/22 0140 06/09/22 0443 06/10/22 0440 06/11/22 0949 06/21/22 0000 06/28/22 0000  WBC 12.0* 7.1  --  8.1 9.2 8.4  NEUTROABS 8.2*  --   --   --  6,348.00 4,897.00  HGB 12.0 9.5*   < > 10.4* 10.4* 10.1*  HCT 38.0 29.8*   < > 32.3* 32* 32*  MCV 89.2 90.3  --  87.8  --   --   PLT 382 314  --  322 357 405*   < > = values in this interval not displayed.   No results found for: "TSH" Lab Results  Component Value Date   HGBA1C 5.6 04/26/2022   Lab Results  Component Value Date   CHOL 233 (H) 04/26/2022   HDL 53 04/26/2022   LDLCALC 122 (H) 04/26/2022   LDLDIRECT 114 (H) 04/26/2022   TRIG 292 (  H) 04/26/2022   CHOLHDL 4.4 04/26/2022    Significant Diagnostic Results in last 30 days:  No results found.  Assessment/Plan =Moderate episode of recurrent major depressive disorder (HCC)  Protein-calorie malnutrition, moderate (HCC)  Hypertension, unspecified type  Chronic diastolic CHF (congestive heart failure) (HCC)  Peptic ulcer  Fall, initial encounter  H/O ischemic vertebrobasilar artery cerebellar stroke Patient's mood is still intermittently low, however, improved. She now asks to have therapy, however, patient has failed numerous attempts in the last couple of months. Weight now stable. Continue sertraline and mirtazapine. BP well-controlled at this tim. Appears euvolemic on exam. Peptic ulcer, but no signs of bleeding in stool at this time. Continue protonix 40 daily. Continue Zoloft 100 mg daily. Continue aspirin  and atorvastatin 40 mg nightly. Continue norvasc 5 and labetalol for BP control.    Family/ staff Communication: nursing, spouse  Labs/tests ordered:  none

## 2022-09-26 ENCOUNTER — Encounter: Payer: Self-pay | Admitting: Student

## 2022-09-26 DIAGNOSIS — F331 Major depressive disorder, recurrent, moderate: Secondary | ICD-10-CM | POA: Insufficient documentation

## 2022-11-02 ENCOUNTER — Non-Acute Institutional Stay (SKILLED_NURSING_FACILITY): Payer: Medicare Other | Admitting: Nurse Practitioner

## 2022-11-02 ENCOUNTER — Encounter: Payer: Self-pay | Admitting: Nurse Practitioner

## 2022-11-02 DIAGNOSIS — E440 Moderate protein-calorie malnutrition: Secondary | ICD-10-CM | POA: Diagnosis not present

## 2022-11-02 DIAGNOSIS — I10 Essential (primary) hypertension: Secondary | ICD-10-CM | POA: Diagnosis not present

## 2022-11-02 DIAGNOSIS — F331 Major depressive disorder, recurrent, moderate: Secondary | ICD-10-CM

## 2022-11-02 DIAGNOSIS — D649 Anemia, unspecified: Secondary | ICD-10-CM

## 2022-11-02 DIAGNOSIS — I5032 Chronic diastolic (congestive) heart failure: Secondary | ICD-10-CM

## 2022-11-02 DIAGNOSIS — R296 Repeated falls: Secondary | ICD-10-CM

## 2022-11-02 DIAGNOSIS — I69319 Unspecified symptoms and signs involving cognitive functions following cerebral infarction: Secondary | ICD-10-CM

## 2022-11-02 NOTE — Progress Notes (Signed)
Location:  Other [place].  Nursing Home Room Number: [facility] [address] Place of Service:  SNF [phone]) Abbey Chatters, NP  PCP: Earnestine Mealing, MD  Patient Care Team: Earnestine Mealing, MD as PCP - General (Family Medicine) Mariah Milling Tollie Pizza, MD as Consulting Physician (Cardiology)  Extended Emergency Contact Information Primary Emergency Contact: Bartram,james Sr. Home Phone: [phone] Mobile Phone: [phone] Relation: Spouse Secondary Emergency Contact: Asa Lente Home Phone: [phone] Mobile Phone: [phone] Relation: Son  Goals of care: Advanced Directive information    11/02/2022    2:36 PM  Advanced Directives  Does Patient Have a Medical Advance Directive? Yes  Type of Advance Directive Out of facility DNR (pink MOST or yellow form)  Does patient want to make changes to medical advance directive? No - Patient declined     Chief Complaint  Patient presents with   Medical Management of Chronic Issues    Medical Management of Chronic Issues.     HPI:  Pt is a 89 y.o. female seen today for medical management of chronic disease.  Pt with hx of CVA, recurrent falls, weight loss, GERD, hyperlipidemia.  Staff has no acute concerns at this time.  Pt reports she is doing well and ready to go home- she is long term at [facility].    Past Medical History:  Diagnosis Date   Actinic keratosis    Breast cancer (HCC)    C1 cervical fracture (HCC)    Hypertension    Osteoarthritis    Past Surgical History:  Procedure Laterality Date   BACK SURGERY     BIOPSY  05/03/2022   Procedure: BIOPSY;  Surgeon: Benancio Deeds, MD;  Location: [facility] ENDOSCOPY;  Service: Gastroenterology;;   ESOPHAGOGASTRODUODENOSCOPY (EGD) WITH PROPOFOL N/A 05/03/2022   Procedure: ESOPHAGOGASTRODUODENOSCOPY (EGD) WITH PROPOFOL;  Surgeon: Benancio Deeds, MD;  Location: [facility] ENDOSCOPY;  Service: Gastroenterology;  Laterality: N/A;   HEMOSTASIS CONTROL  05/03/2022   Procedure:  HEMOSTASIS CONTROL;  Surgeon: Benancio Deeds, MD;  Location: [facility] ENDOSCOPY;  Service: Gastroenterology;;   IR INJECT/THERA/INC NEEDLE/CATH/PLC EPI/LUMB/SAC [facility]  04/29/2021   MOHS SURGERY      Allergies  Allergen Reactions   Morphine     Other reaction(s): Other (See Comments) Apnea with large dose Respiratory Distress    Buprenorphine Hcl     Other reaction(s): Other (See Comments), Other (See Comments) Respiratory Distress Respiratory Distress    Codeine    Epinephrine     Other reaction(s): Other (See Comments) Severe anxiety; "makes my heart race" - would like to avoid during surgery   Morphine And Codeine Other (See Comments)    Respiratory Distress    Percocet [Oxycodone-Acetaminophen]     Outpatient Encounter Medications as of 11/02/2022  Medication Sig   acetaminophen (TYLENOL) 500 MG tablet Take 1,000 mg by mouth every 8 (eight) hours as needed.   amLODipine (NORVASC) 5 MG tablet Take 1 tablet (5 mg total) by mouth daily.   aspirin EC 81 MG tablet Take 1 tablet (81 mg total) by mouth daily. Swallow whole.   atorvastatin (LIPITOR) 40 MG tablet Take 1 tablet (40 mg total) by mouth daily.   Baclofen 5 MG TABS Take 1 tablet (5 mg total) by mouth 2 (two) times daily as needed (HEADACHES AND HICCUPS).   cholecalciferol (VITAMIN D) 25 MCG (1000 UNIT) tablet Take 2,000 Units by mouth daily.   Cyanocobalamin (B-12) 1000 MCG CAPS Take 2,000 mcg by mouth daily.   labetalol (NORMODYNE) 200 MG tablet Take 200  mg by mouth 2 (two) times daily.   lactose free nutrition (BOOST) LIQD Take 237 mLs by mouth 3 (three) times daily between meals.   loperamide (IMODIUM A-D) 2 MG tablet Take 1 tablet (2 mg total) by mouth 4 (four) times daily as needed for diarrhea or loose stools.   ondansetron (ZOFRAN) 4 MG tablet Take one tablet by mouth every 6 hours as needed.   pantoprazole (PROTONIX) 40 MG tablet Take 40 mg by mouth daily.   polyethylene glycol powder (GLYCOLAX/MIRALAX) 17 GM/SCOOP  powder Take 17 g by mouth every 12 (twelve) hours as needed for mild constipation. And once daily in the morning scheduled   promethazine (PHENERGAN) 12.5 MG tablet Take 1 tablet (12.5 mg total) by mouth every 8 (eight) hours as needed for nausea or vomiting.   senna (SENOKOT) 8.6 MG TABS tablet Take 2 tablets by mouth at bedtime.   sertraline (ZOLOFT) 100 MG tablet Take 1 tablet (100 mg total) by mouth daily.   tamsulosin (FLOMAX) 0.4 MG CAPS capsule Take 0.4 mg by mouth daily after supper.   Zinc Oxide (TRIPLE PASTE) 12.8 % ointment Apply 1 Application topically as needed for irritation.   No facility-administered encounter medications on file as of 11/02/2022.    Review of Systems  Unable to perform ROS: Dementia     Immunization History  Administered Date(s) Administered   Influenza Inj Mdck Quad Pf 11/30/2021   Influenza, High Dose Seasonal PF 12/16/2020   Influenza-Unspecified 12/06/2011, 11/27/2012, 01/19/2014, 12/11/2014, 12/03/2015, 12/10/2016, 12/18/2019   PFIZER Comirnaty(Gray Top)Covid-19 Tri-Sucrose Vaccine 03/13/2019, 04/03/2019, 12/05/2019   Pneumococcal Conjugate-13 12/31/2016, 01/05/2017   Pneumococcal Polysaccharide-23 01/05/2018   Td (Adult),5 Lf Tetanus Toxid, Preservative Free 07/16/2019   Tdap 06/18/2011   Unspecified SARS-COV-2 Vaccination 03/12/2019, 04/02/2019, 12/05/2019   Zoster Recombinant(Shingrix) 06/30/2016, 10/26/2017, 01/06/2018   Pertinent  Health Maintenance Due  Topic Date Due   INFLUENZA VACCINE  09/30/2022   DEXA SCAN  Completed      01/08/2022    7:58 PM 01/09/2022    9:00 AM 01/09/2022    8:25 PM 01/10/2022    8:50 AM 08/16/2022   10:39 AM  Fall Risk  Falls in the past year?     1  Was there an injury with Fall?     1  Fall Risk Category Calculator     3  (RETIRED) Patient Fall Risk Level Moderate fall risk High fall risk High fall risk High fall risk   Patient at Risk for Falls Due to     History of fall(s);Impaired  balance/gait;Impaired mobility  Fall risk Follow up     Falls evaluation completed   Functional Status Survey:    Vitals:   11/02/22 1419  BP: 130/70  Pulse: 72  Resp: 20  Temp: (!) 96.5 F (35.8 C)  SpO2: 97%  Weight: 128 lb 12.8 oz (58.4 kg)  Height: 5\' 2"  (1.575 m)   Body mass index is 23.56 kg/m. Wt Readings from Last 3 Encounters:  11/02/22 128 lb 12.8 oz (58.4 kg)  09/24/22 129 lb 3.2 oz (58.6 kg)  09/07/22 126 lb 9.6 oz (57.4 kg)    Physical Exam Constitutional:      General: She is not in acute distress.    Appearance: She is well-developed. She is not diaphoretic.  HENT:     Head: Normocephalic and atraumatic.     Mouth/Throat:     Pharynx: No oropharyngeal exudate.  Eyes:     Conjunctiva/sclera: Conjunctivae normal.  Pupils: Pupils are equal, round, and reactive to light.  Cardiovascular:     Rate and Rhythm: Normal rate and regular rhythm.     Heart sounds: Normal heart sounds.  Pulmonary:     Effort: Pulmonary effort is normal.     Breath sounds: Normal breath sounds.  Abdominal:     General: Bowel sounds are normal.     Palpations: Abdomen is soft.  Musculoskeletal:     Cervical back: Normal range of motion and neck supple.     Right lower leg: No edema.     Left lower leg: No edema.  Skin:    General: Skin is warm and dry.  Neurological:     Mental Status: She is alert. She is disoriented.     Motor: Weakness present.     Gait: Gait abnormal.  Psychiatric:        Mood and Affect: Mood normal.     Labs reviewed: Recent Labs    04/26/22 0320 05/01/22 1251 05/07/22 1052 05/08/22 0425 05/10/22 0657 05/19/22 1028 05/27/22 0000 06/08/22 0140 06/11/22 0949 06/21/22 0000 06/28/22 0000  NA 133*   < > 134* 135   < > 138   < > 137 137 137 139  K 3.5   < > 3.5 3.8   < > 3.7   < > 4.0 3.3* 4.0 4.2  CL 97*   < > 100 103   < > 103   < > 103 105 100 102  CO2 22   < > 23 23   < > 23   < > 23 21* 26* 27*  GLUCOSE 131*   < > 142* 100*   < >  152*  --  135* 141*  --   --   BUN 19   < > 13 10   < > 25*   < > 18 17 21 21   CREATININE 0.64   < > 0.72 0.54   < > 0.49   < > 0.62 0.52 0.7 0.6  CALCIUM 8.9   < > 8.5* 8.5*   < > 9.2   < > 9.5 9.2 9.7 9.5  MG 1.4*   < > 1.9 1.8  --   --   --   --  1.2*  --   --   PHOS 3.0  --   --   --   --   --   --   --   --   --   --    < > = values in this interval not displayed.   Recent Labs    01/08/22 0327 04/25/22 0948 05/15/22 1404 06/21/22 0000  AST 15 26 23 13   ALT 14 23 15 12   ALKPHOS 52 43 62 63  BILITOT 0.4 0.9 0.5  --   PROT 5.2* 7.0 6.5  --   ALBUMIN 2.7* 4.1 3.0* 3.4*   Recent Labs    06/08/22 0140 06/09/22 0443 06/10/22 0440 06/11/22 0949 06/21/22 0000 06/28/22 0000  WBC 12.0* 7.1  --  8.1 9.2 8.4  NEUTROABS 8.2*  --   --   --  6,348.00 4,897.00  HGB 12.0 9.5*   < > 10.4* 10.4* 10.1*  HCT 38.0 29.8*   < > 32.3* 32* 32*  MCV 89.2 90.3  --  87.8  --   --   PLT 382 314  --  322 357 405*   < > = values in this interval not displayed.   No  results found for: "TSH" Lab Results  Component Value Date   HGBA1C 5.6 04/26/2022   Lab Results  Component Value Date   CHOL 233 (H) 04/26/2022   HDL 53 04/26/2022   LDLCALC 122 (H) 04/26/2022   LDLDIRECT 114 (H) 04/26/2022   TRIG 292 (H) 04/26/2022   CHOLHDL 4.4 04/26/2022    Significant Diagnostic Results in last 30 days:  No results found.  Assessment/Plan 1. Protein-calorie malnutrition, moderate (HCC) encouraged to liberalize diet. To have protein supplement in addition to smallest meal of the day.  2. Hypertension, unspecified type -Blood pressure well controlled, goal bp <140/90 Continue current medications and dietary modifications follow metabolic panel  3. Chronic diastolic CHF (congestive heart failure) (HCC) Euvolemic, continue current regimen  4. Moderate episode of recurrent major depressive disorder (HCC) Mood has been stable, continues on zoloft   5. Continues on asa , old, cognitive  deficits -continues on asa with lipitor -no acute changes in cognitive or functional status, continue supportive care.   6. Recurrent falls Continue fall precautions  7. Anemia, unspecified type No signs of blood loss, will follow up cbc       Lucianne Smestad K. Biagio Borg [facility] [phone]

## 2022-11-03 ENCOUNTER — Encounter: Payer: Self-pay | Admitting: Student

## 2022-11-03 ENCOUNTER — Non-Acute Institutional Stay (SKILLED_NURSING_FACILITY): Payer: Medicare Other | Admitting: Student

## 2022-11-03 DIAGNOSIS — R296 Repeated falls: Secondary | ICD-10-CM | POA: Diagnosis not present

## 2022-11-03 DIAGNOSIS — M25512 Pain in left shoulder: Secondary | ICD-10-CM

## 2022-11-03 NOTE — Progress Notes (Signed)
Location:  Other [place].  Nursing Home Room Number: [facility] [address] Place of Service:  SNF [phone]) Provider:  Earnestine Mealing, MD  Patient Care Team: Earnestine Mealing, MD as PCP - General (Family Medicine) Antonieta Iba, MD as Consulting Physician (Cardiology)  Extended Emergency Contact Information Primary Emergency Contact: Blouch,james Sr. Home Phone: [phone] Mobile Phone: [phone] Relation: Spouse Secondary Emergency Contact: Asa Lente Home Phone: [phone] Mobile Phone: [phone] Relation: Son  Code Status:  DNR Goals of care: Advanced Directive information    11/02/2022    2:36 PM  Advanced Directives  Does Patient Have a Medical Advance Directive? Yes  Type of Advance Directive Out of facility DNR (pink MOST or yellow form)  Does patient want to make changes to medical advance directive? No - Patient declined     Chief Complaint  Patient presents with   Acute Visit    Fall    HPI:  Pt is a 89 y.o. female seen today for an acute visit for Fall  Patient does not remember falling. States she has some pain in her left arm and doesn't know why it is so hard to lift her arm.   IDT meeting today discussing recurrent falls. Plan to keep patient in common area to redirect throughout the day.    Past Medical History:  Diagnosis Date   Actinic keratosis    Breast cancer (HCC)    C1 cervical fracture (HCC)    Hypertension    Osteoarthritis    Past Surgical History:  Procedure Laterality Date   BACK SURGERY     BIOPSY  05/03/2022   Procedure: BIOPSY;  Surgeon: Benancio Deeds, MD;  Location: [facility] ENDOSCOPY;  Service: Gastroenterology;;   ESOPHAGOGASTRODUODENOSCOPY (EGD) WITH PROPOFOL N/A 05/03/2022   Procedure: ESOPHAGOGASTRODUODENOSCOPY (EGD) WITH PROPOFOL;  Surgeon: Benancio Deeds, MD;  Location: [facility] ENDOSCOPY;  Service: Gastroenterology;  Laterality: N/A;   HEMOSTASIS CONTROL  05/03/2022   Procedure: HEMOSTASIS CONTROL;   Surgeon: Benancio Deeds, MD;  Location: [facility] ENDOSCOPY;  Service: Gastroenterology;;   IR INJECT/THERA/INC NEEDLE/CATH/PLC EPI/LUMB/SAC [place]  04/29/2021   MOHS SURGERY      Allergies  Allergen Reactions   Morphine     Other reaction(s): Other (See Comments) Apnea with large dose Respiratory Distress    Buprenorphine Hcl     Other reaction(s): Other (See Comments), Other (See Comments) Respiratory Distress Respiratory Distress    Codeine    Epinephrine     Other reaction(s): Other (See Comments) Severe anxiety; "makes my heart race" - would like to avoid during surgery   Morphine And Codeine Other (See Comments)    Respiratory Distress    Percocet [Oxycodone-Acetaminophen]     Outpatient Encounter Medications as of 11/03/2022  Medication Sig   acetaminophen (TYLENOL) 500 MG tablet Take 1,000 mg by mouth every 8 (eight) hours as needed.   amLODipine (NORVASC) 5 MG tablet Take 1 tablet (5 mg total) by mouth daily.   aspirin EC 81 MG tablet Take 1 tablet (81 mg total) by mouth daily. Swallow whole.   atorvastatin (LIPITOR) 40 MG tablet Take 1 tablet (40 mg total) by mouth daily.   Baclofen 5 MG TABS Take 1 tablet (5 mg total) by mouth 2 (two) times daily as needed (HEADACHES AND HICCUPS).   cholecalciferol (VITAMIN D) 25 MCG (1000 UNIT) tablet Take 2,000 Units by mouth daily.   Cyanocobalamin (B-12) 1000 MCG CAPS Take 2,000 mcg by mouth daily.   labetalol (NORMODYNE) 200 MG tablet Take 200  mg by mouth 2 (two) times daily.   lactose free nutrition (BOOST) LIQD Take 237 mLs by mouth 3 (three) times daily between meals.   loperamide (IMODIUM A-D) 2 MG tablet Take 1 tablet (2 mg total) by mouth 4 (four) times daily as needed for diarrhea or loose stools.   ondansetron (ZOFRAN) 4 MG tablet Take one tablet by mouth every 6 hours as needed.   pantoprazole (PROTONIX) 40 MG tablet Take 40 mg by mouth daily.   polyethylene glycol powder (GLYCOLAX/MIRALAX) 17 GM/SCOOP powder Take 17 g by  mouth every 12 (twelve) hours as needed for mild constipation. And once daily in the morning scheduled   promethazine (PHENERGAN) 12.5 MG tablet Take 1 tablet (12.5 mg total) by mouth every 8 (eight) hours as needed for nausea or vomiting.   senna (SENOKOT) 8.6 MG TABS tablet Take 2 tablets by mouth at bedtime.   sertraline (ZOLOFT) 100 MG tablet Take 1 tablet (100 mg total) by mouth daily.   tamsulosin (FLOMAX) 0.4 MG CAPS capsule Take 0.4 mg by mouth daily after supper.   Zinc Oxide (TRIPLE PASTE) 12.8 % ointment Apply 1 Application topically as needed for irritation.   No facility-administered encounter medications on file as of 11/03/2022.    Review of Systems  Immunization History  Administered Date(s) Administered   Influenza Inj Mdck Quad Pf 11/30/2021   Influenza, High Dose Seasonal PF 12/16/2020   Influenza-Unspecified 12/06/2011, 11/27/2012, 01/19/2014, 12/11/2014, 12/03/2015, 12/10/2016, 12/18/2019   PFIZER Comirnaty(Gray Top)Covid-19 Tri-Sucrose Vaccine 03/13/2019, 04/03/2019, 12/05/2019   Pneumococcal Conjugate-13 12/31/2016, 01/05/2017   Pneumococcal Polysaccharide-23 01/05/2018   Td (Adult),5 Lf Tetanus Toxid, Preservative Free 07/16/2019   Tdap 06/18/2011   Unspecified SARS-COV-2 Vaccination 03/12/2019, 04/02/2019, 12/05/2019   Zoster Recombinant(Shingrix) 06/30/2016, 10/26/2017, 01/06/2018   Pertinent  Health Maintenance Due  Topic Date Due   INFLUENZA VACCINE  09/30/2022   DEXA SCAN  Completed      01/08/2022    7:58 PM 01/09/2022    9:00 AM 01/09/2022    8:25 PM 01/10/2022    8:50 AM 08/16/2022   10:39 AM  Fall Risk  Falls in the past year?     1  Was there an injury with Fall?     1  Fall Risk Category Calculator     3  (RETIRED) Patient Fall Risk Level Moderate fall risk High fall risk High fall risk High fall risk   Patient at Risk for Falls Due to     History of fall(s);Impaired balance/gait;Impaired mobility  Fall risk Follow up     Falls evaluation  completed   Functional Status Survey:    Vitals:   11/03/22 1250  BP: 128/72  Pulse: 76  Resp: 16  Temp: 97.7 F (36.5 C)  SpO2: 96%  Weight: 128 lb 12.8 oz (58.4 kg)  Height: 5\' 2"  (1.575 m)   Body mass index is 23.56 kg/m. Physical Exam Constitutional:      Appearance: Normal appearance.  HENT:     Head: Normocephalic and atraumatic.  Cardiovascular:     Rate and Rhythm: Normal rate.  Pulmonary:     Effort: Pulmonary effort is normal.     Breath sounds: Normal breath sounds.  Musculoskeletal:     Comments: 4/5 strength bilateral deltoids, bicep, tricep.   Neurological:     Mental Status: She is alert.     Labs reviewed: Recent Labs    04/26/22 0320 05/01/22 1251 05/07/22 1052 05/08/22 0425 05/10/22 0657 05/19/22 1028 05/27/22 0000 06/08/22  0140 06/11/22 0949 06/21/22 0000 06/28/22 0000  NA 133*   < > 134* 135   < > 138   < > 137 137 137 139  K 3.5   < > 3.5 3.8   < > 3.7   < > 4.0 3.3* 4.0 4.2  CL 97*   < > 100 103   < > 103   < > 103 105 100 102  CO2 22   < > 23 23   < > 23   < > 23 21* 26* 27*  GLUCOSE 131*   < > 142* 100*   < > 152*  --  135* 141*  --   --   BUN 19   < > 13 10   < > 25*   < > 18 17 21 21   CREATININE 0.64   < > 0.72 0.54   < > 0.49   < > 0.62 0.52 0.7 0.6  CALCIUM 8.9   < > 8.5* 8.5*   < > 9.2   < > 9.5 9.2 9.7 9.5  MG 1.4*   < > 1.9 1.8  --   --   --   --  1.2*  --   --   PHOS 3.0  --   --   --   --   --   --   --   --   --   --    < > = values in this interval not displayed.   Recent Labs    01/08/22 0327 04/25/22 0948 05/15/22 1404 06/21/22 0000  AST 15 26 23 13   ALT 14 23 15 12   ALKPHOS 52 43 62 63  BILITOT 0.4 0.9 0.5  --   PROT 5.2* 7.0 6.5  --   ALBUMIN 2.7* 4.1 3.0* 3.4*   Recent Labs    06/08/22 0140 06/09/22 0443 06/10/22 0440 06/11/22 0949 06/21/22 0000 06/28/22 0000  WBC 12.0* 7.1  --  8.1 9.2 8.4  NEUTROABS 8.2*  --   --   --  6,348.00 4,897.00  HGB 12.0 9.5*   < > 10.4* 10.4* 10.1*  HCT 38.0 29.8*    < > 32.3* 32* 32*  MCV 89.2 90.3  --  87.8  --   --   PLT 382 314  --  322 357 405*   < > = values in this interval not displayed.   No results found for: "TSH" Lab Results  Component Value Date   HGBA1C 5.6 04/26/2022   Lab Results  Component Value Date   CHOL 233 (H) 04/26/2022   HDL 53 04/26/2022   LDLCALC 122 (H) 04/26/2022   LDLDIRECT 114 (H) 04/26/2022   TRIG 292 (H) 04/26/2022   CHOLHDL 4.4 04/26/2022    Significant Diagnostic Results in last 30 days:  No results found.  Assessment/Plan Recurrent falls  Acute pain of left shoulder X-ray left shoulder to evaluate for fracture or dislocation. No pain. Medications appropriate at this time. Severely poor short term memory. Continue supportive care.   Family/ staff Communication: nursing  Labs/tests ordered:  x-ray left shoulder

## 2023-01-19 ENCOUNTER — Encounter: Payer: Self-pay | Admitting: Student

## 2023-01-19 ENCOUNTER — Non-Acute Institutional Stay (SKILLED_NURSING_FACILITY): Payer: Medicare Other | Admitting: Student

## 2023-01-19 DIAGNOSIS — I69319 Unspecified symptoms and signs involving cognitive functions following cerebral infarction: Secondary | ICD-10-CM

## 2023-01-19 DIAGNOSIS — I10 Essential (primary) hypertension: Secondary | ICD-10-CM

## 2023-01-19 DIAGNOSIS — F331 Major depressive disorder, recurrent, moderate: Secondary | ICD-10-CM | POA: Diagnosis not present

## 2023-01-19 DIAGNOSIS — E440 Moderate protein-calorie malnutrition: Secondary | ICD-10-CM | POA: Diagnosis not present

## 2023-01-19 DIAGNOSIS — I5032 Chronic diastolic (congestive) heart failure: Secondary | ICD-10-CM

## 2023-01-19 DIAGNOSIS — S3210XA Unspecified fracture of sacrum, initial encounter for closed fracture: Secondary | ICD-10-CM

## 2023-01-19 DIAGNOSIS — S065XAA Traumatic subdural hemorrhage with loss of consciousness status unknown, initial encounter: Secondary | ICD-10-CM

## 2023-01-19 NOTE — Progress Notes (Signed)
Location:  Other [facility]) Nursing Home Room Number: 318 A Place of Service:  SNF [phone]) Provider:  Earnestine Mealing, MD  Patient Care Team: Earnestine Mealing, MD as PCP - General (Family Medicine) Antonieta Iba, MD as Consulting Physician (Cardiology)  Extended Emergency Contact Information Primary Emergency Contact: Wollen,james Sr. Home Phone: [phone] Mobile Phone: [phone] Relation: Spouse Secondary Emergency Contact: Asa Lente Home Phone: [phone] Mobile Phone: [phone] Relation: Son  Code Status:  DNR Goals of care: Advanced Directive information    01/19/2023    9:48 AM  Advanced Directives  Does Patient Have a Medical Advance Directive? Yes  Type of Advance Directive Out of facility DNR (pink MOST or yellow form)  Does patient want to make changes to medical advance directive? No - Patient declined  Pre-existing out of facility DNR order (yellow form or pink MOST form) Pink MOST form placed in chart (order not valid for inpatient use)     Chief Complaint  Patient presents with   Medical Management of Chronic Issues    Routine visit, discuss need for flu vaccine, covid booster and AWV.     HPI:  Pt is a 89 y.o. female seen today for medical management of chronic diseases.   Patient states she is doing well without any concerns at this time.  She is eating well.  Having normal bowel movements.  Nursing without acute concerns at this time  Past Medical History:  Diagnosis Date   Actinic keratosis    Breast cancer (HCC)    C1 cervical fracture (HCC)    Hypertension    Osteoarthritis    Past Surgical History:  Procedure Laterality Date   BACK SURGERY     BIOPSY  05/03/2022   Procedure: BIOPSY;  Surgeon: Benancio Deeds, MD;  Location: [facility] ENDOSCOPY;  Service: Gastroenterology;;   ESOPHAGOGASTRODUODENOSCOPY (EGD) WITH PROPOFOL N/A 05/03/2022   Procedure: ESOPHAGOGASTRODUODENOSCOPY (EGD) WITH PROPOFOL;  Surgeon: Benancio Deeds, MD;  Location: [facility] ENDOSCOPY;  Service: Gastroenterology;  Laterality: N/A;   HEMOSTASIS CONTROL  05/03/2022   Procedure: HEMOSTASIS CONTROL;  Surgeon: Benancio Deeds, MD;  Location: [facility] ENDOSCOPY;  Service: Gastroenterology;;   IR INJECT/THERA/INC NEEDLE/CATH/PLC EPI/LUMB/SAC [facility]  04/29/2021   MOHS SURGERY      Allergies  Allergen Reactions   Morphine     Other reaction(s): Other (See Comments) Apnea with large dose Respiratory Distress    Buprenorphine Hcl     Other reaction(s): Other (See Comments), Other (See Comments) Respiratory Distress Respiratory Distress    Codeine    Epinephrine     Other reaction(s): Other (See Comments) Severe anxiety; "makes my heart race" - would like to avoid during surgery   Morphine And Codeine Other (See Comments)    Respiratory Distress    Percocet [Oxycodone-Acetaminophen]     Outpatient Encounter Medications as of 01/19/2023  Medication Sig   acetaminophen (TYLENOL) 500 MG tablet Take 1,000 mg by mouth every 8 (eight) hours as needed.   amLODipine (NORVASC) 5 MG tablet Take 1 tablet (5 mg total) by mouth daily.   aspirin EC 81 MG tablet Take 1 tablet (81 mg total) by mouth daily. Swallow whole.   atorvastatin (LIPITOR) 40 MG tablet Take 1 tablet (40 mg total) by mouth daily.   Baclofen 5 MG TABS Take 1 tablet (5 mg total) by mouth 2 (two) times daily as needed (HEADACHES AND HICCUPS).   cholecalciferol (VITAMIN D) 25 MCG (1000 UNIT) tablet Take 2,000 Units by mouth daily.  Cyanocobalamin (B-12) 1000 MCG CAPS Take 2,000 mcg by mouth daily.   labetalol (NORMODYNE) 200 MG tablet Take 200 mg by mouth 2 (two) times daily.   lactose free nutrition (BOOST) LIQD Take 237 mLs by mouth 3 (three) times daily between meals.   loperamide (IMODIUM A-D) 2 MG tablet Take 2 mg by mouth every 6 (six) hours as needed for diarrhea or loose stools.   ondansetron (ZOFRAN) 4 MG tablet Take one tablet by mouth every 6 hours as needed.    pantoprazole (PROTONIX) 40 MG tablet Take 40 mg by mouth daily.   polyethylene glycol powder (GLYCOLAX/MIRALAX) 17 GM/SCOOP powder Take 17 g by mouth every 12 (twelve) hours as needed for mild constipation. And once daily in the morning scheduled   promethazine (PHENERGAN) 12.5 MG tablet Take 1 tablet (12.5 mg total) by mouth every 8 (eight) hours as needed for nausea or vomiting.   senna (SENOKOT) 8.6 MG TABS tablet Take 2 tablets by mouth at bedtime.   sertraline (ZOLOFT) 100 MG tablet Take 1 tablet (100 mg total) by mouth daily.   tamsulosin (FLOMAX) 0.4 MG CAPS capsule Take 0.4 mg by mouth daily after supper.   Zinc Oxide (TRIPLE PASTE) 12.8 % ointment Apply 1 Application topically as needed for irritation.   loperamide (IMODIUM A-D) 2 MG tablet Take 1 tablet (2 mg total) by mouth 4 (four) times daily as needed for diarrhea or loose stools. (Patient not taking: Reported on 01/19/2023)   No facility-administered encounter medications on file as of 01/19/2023.    Review of Systems  Immunization History  Administered Date(s) Administered   Influenza Inj Mdck Quad Pf 11/30/2021   Influenza, High Dose Seasonal PF 12/16/2020   Influenza-Unspecified 12/06/2011, 11/27/2012, 01/19/2014, 12/11/2014, 12/03/2015, 12/10/2016, 12/18/2019   PFIZER Comirnaty(Gray Top)Covid-19 Tri-Sucrose Vaccine 03/13/2019, 04/03/2019, 12/05/2019   Pneumococcal Conjugate-13 12/31/2016, 01/05/2017   Pneumococcal Polysaccharide-23 01/05/2018   Td (Adult),5 Lf Tetanus Toxid, Preservative Free 07/16/2019   Tdap 06/18/2011   Unspecified SARS-COV-2 Vaccination 03/12/2019, 04/02/2019, 12/05/2019   Zoster Recombinant(Shingrix) 06/30/2016, 10/26/2017, 01/06/2018   Pertinent  Health Maintenance Due  Topic Date Due   INFLUENZA VACCINE  09/30/2022   DEXA SCAN  Completed      01/08/2022    7:58 PM 01/09/2022    9:00 AM 01/09/2022    8:25 PM 01/10/2022    8:50 AM 08/16/2022   10:39 AM  Fall Risk  Falls in the past year?      1  Was there an injury with Fall?     1  Fall Risk Category Calculator     3  (RETIRED) Patient Fall Risk Level Moderate fall risk High fall risk High fall risk High fall risk   Patient at Risk for Falls Due to     History of fall(s);Impaired balance/gait;Impaired mobility  Fall risk Follow up     Falls evaluation completed   Functional Status Survey:    Vitals:   01/19/23 0940  BP: (!) 109/57  Pulse: 83  Weight: 126 lb 12.8 oz (57.5 kg)  Height: 5\' 2"  (1.575 m)   Body mass index is 23.19 kg/m. Physical Exam Cardiovascular:     Pulses: Normal pulses.     Heart sounds: Normal heart sounds.  Pulmonary:     Effort: Pulmonary effort is normal.     Breath sounds: Normal breath sounds.  Neurological:     Mental Status: She is alert. Mental status is at baseline. She is disoriented.     Labs reviewed:  Recent Labs    04/26/22 0320 05/01/22 1251 05/07/22 1052 05/08/22 0425 05/10/22 0657 05/19/22 1028 05/27/22 0000 06/08/22 0140 06/11/22 0949 06/21/22 0000 06/28/22 0000  NA 133*   < > 134* 135   < > 138   < > 137 137 137 139  K 3.5   < > 3.5 3.8   < > 3.7   < > 4.0 3.3* 4.0 4.2  CL 97*   < > 100 103   < > 103   < > 103 105 100 102  CO2 22   < > 23 23   < > 23   < > 23 21* 26* 27*  GLUCOSE 131*   < > 142* 100*   < > 152*  --  135* 141*  --   --   BUN 19   < > 13 10   < > 25*   < > 18 17 21 21   CREATININE 0.64   < > 0.72 0.54   < > 0.49   < > 0.62 0.52 0.7 0.6  CALCIUM 8.9   < > 8.5* 8.5*   < > 9.2   < > 9.5 9.2 9.7 9.5  MG 1.4*   < > 1.9 1.8  --   --   --   --  1.2*  --   --   PHOS 3.0  --   --   --   --   --   --   --   --   --   --    < > = values in this interval not displayed.   Recent Labs    04/25/22 0948 05/15/22 1404 06/21/22 0000  AST 26 23 13   ALT 23 15 12   ALKPHOS 43 62 63  BILITOT 0.9 0.5  --   PROT 7.0 6.5  --   ALBUMIN 4.1 3.0* 3.4*   Recent Labs    06/08/22 0140 06/09/22 0443 06/10/22 0440 06/11/22 0949 06/21/22 0000 06/28/22 0000   WBC 12.0* 7.1  --  8.1 9.2 8.4  NEUTROABS 8.2*  --   --   --  6,348.00 4,897.00  HGB 12.0 9.5*   < > 10.4* 10.4* 10.1*  HCT 38.0 29.8*   < > 32.3* 32* 32*  MCV 89.2 90.3  --  87.8  --   --   PLT 382 314  --  322 357 405*   < > = values in this interval not displayed.   No results found for: "TSH" Lab Results  Component Value Date   HGBA1C 5.6 04/26/2022   Lab Results  Component Value Date   CHOL 233 (H) 04/26/2022   HDL 53 04/26/2022   LDLCALC 122 (H) 04/26/2022   LDLDIRECT 114 (H) 04/26/2022   TRIG 292 (H) 04/26/2022   CHOLHDL 4.4 04/26/2022    Significant Diagnostic Results in last 30 days:  No results found.  Assessment/Plan Moderate episode of recurrent major depressive disorder (HCC)  Chronic diastolic CHF (congestive heart failure) (HCC)  Protein-calorie malnutrition, moderate (HCC)  Subdural hematoma (HCC)  Closed fracture of sacrum, unspecified portion of sacrum, initial encounter (HCC)  Hypertension, unspecified type  CVA, old, cognitive deficits Seen today for routine evaluation.  Patient initially admitted after a stroke March of this year and has had recurrent falls since the stroke and had a fall on 08/15/2022 resulting in a closed fracture of the sacrum.  No pain at this time.  Patient's weight is stable for the last  6 months.  Continue protein supplementation as indicated.  Blood pressure well-controlled at this time on current regimen.  Mood stable on current dose of Zoloft.  Patient without nausea or vomiting at this time.  No recent headaches.  Continue aspirin and Lipitor given history of strokes.  Continue Norvasc and labetalol for blood pressure.  Continue goals of care conversations with family.  Family/ staff Communication: nursing  Labs/tests ordered:  CBC, BMP

## 2023-01-23 ENCOUNTER — Encounter: Payer: Self-pay | Admitting: Student

## 2023-01-30 ENCOUNTER — Telehealth: Payer: Self-pay | Admitting: Adult Health

## 2023-01-30 NOTE — Telephone Encounter (Signed)
Nurse called on 01/29/23 to report Sherry [name] had a temp of 100.1 and a sore throat. She also feels weak. Other vitals are normal. No cough or SOB. Normal 02 sats. No dysuria or frequency. Covid swab negative. Ordered flu swab and to push fluids, continue to monitor patient and report back if worsening or not improving.

## 2023-01-31 ENCOUNTER — Non-Acute Institutional Stay (SKILLED_NURSING_FACILITY): Payer: Medicare Other | Admitting: Student

## 2023-01-31 ENCOUNTER — Encounter: Payer: Self-pay | Admitting: Student

## 2023-01-31 DIAGNOSIS — J029 Acute pharyngitis, unspecified: Secondary | ICD-10-CM | POA: Diagnosis not present

## 2023-01-31 NOTE — Progress Notes (Signed)
Location:  Other Nursing Home Room Number: [facility] [address] Place of Service:  SNF [phone]) Provider:  Ander Gaster, Benetta Spar, MD  Patient Care Team: Earnestine Mealing, MD as PCP - General (Family Medicine) Antonieta Iba, MD as Consulting Physician (Cardiology)  Extended Emergency Contact Information Primary Emergency Contact: Kirschenbaum,james Sr. Home Phone: [phone] Mobile Phone: [phone] Relation: Spouse Secondary Emergency Contact: Asa Lente Home Phone: [phone] Mobile Phone: [phone] Relation: Son  Code Status:  DNR  Goals of care: Advanced Directive information    01/19/2023    9:48 AM  Advanced Directives  Does Patient Have a Medical Advance Directive? Yes  Type of Advance Directive Out of facility DNR (pink MOST or yellow form)  Does patient want to make changes to medical advance directive? No - Patient declined  Pre-existing out of facility DNR order (yellow form or pink MOST form) Pink MOST form placed in chart (order not valid for inpatient use)     Chief Complaint  Patient presents with   Fever   Sore Throat    HPI:  Pt is a 89 y.o. [name] seen today for an acute visit for sore throat over the weekend. COVID negative. Afebrile. Denies throat pain at this time. Eating well.   Nurse called on 01/29/23 to report Ms. Lefevers had a temp of 100.1 and a sore throat. She also feels weak. Other vitals are normal. No cough or SOB. Normal 02 sats. No dysuria or frequency. Covid swab negative. Ordered flu swab and to push fluids, continue to monitor patient and report back if worsening or not improving.        Past Medical History:  Diagnosis Date   Actinic keratosis    Breast cancer (HCC)    C1 cervical fracture (HCC)    Hypertension    Osteoarthritis    Past Surgical History:  Procedure Laterality Date   BACK SURGERY     BIOPSY  05/03/2022   Procedure: BIOPSY;  Surgeon: Benancio Deeds, MD;  Location: [facility] ENDOSCOPY;  Service:  Gastroenterology;;   ESOPHAGOGASTRODUODENOSCOPY (EGD) WITH PROPOFOL N/A 05/03/2022   Procedure: ESOPHAGOGASTRODUODENOSCOPY (EGD) WITH PROPOFOL;  Surgeon: Benancio Deeds, MD;  Location: [facility] ENDOSCOPY;  Service: Gastroenterology;  Laterality: N/A;   HEMOSTASIS CONTROL  05/03/2022   Procedure: HEMOSTASIS CONTROL;  Surgeon: Benancio Deeds, MD;  Location: [facility] ENDOSCOPY;  Service: Gastroenterology;;   IR INJECT/THERA/INC NEEDLE/CATH/PLC EPI/LUMB/SAC [facility]  04/29/2021   MOHS SURGERY      Allergies  Allergen Reactions   Morphine     Other reaction(s): Other (See Comments) Apnea with large dose Respiratory Distress    Buprenorphine Hcl     Other reaction(s): Other (See Comments), Other (See Comments) Respiratory Distress Respiratory Distress    Codeine    Epinephrine     Other reaction(s): Other (See Comments) Severe anxiety; "makes my heart race" - would like to avoid during surgery   Morphine And Codeine Other (See Comments)    Respiratory Distress    Percocet [Oxycodone-Acetaminophen]     Outpatient Encounter Medications as of 01/31/2023  Medication Sig   acetaminophen (TYLENOL) 500 MG tablet Take 1,000 mg by mouth every 8 (eight) hours as needed.   amLODipine (NORVASC) 5 MG tablet Take 1 tablet (5 mg total) by mouth daily.   aspirin EC 81 MG tablet Take 1 tablet (81 mg total) by mouth daily. Swallow whole.   atorvastatin (LIPITOR) 40 MG tablet Take 1 tablet (40 mg total) by mouth daily.   Baclofen 5 MG  TABS Take 1 tablet (5 mg total) by mouth 2 (two) times daily as needed (HEADACHES AND HICCUPS).   cholecalciferol (VITAMIN D) 25 MCG (1000 UNIT) tablet Take 2,000 Units by mouth daily.   Cyanocobalamin (B-12) 1000 MCG CAPS Take 2,000 mcg by mouth daily.   labetalol (NORMODYNE) 200 MG tablet Take 200 mg by mouth 2 (two) times daily.   lactose free nutrition (BOOST) LIQD Take 237 mLs by mouth 3 (three) times daily between meals.   loperamide (IMODIUM A-D) 2 MG tablet Take 1 tablet  (2 mg total) by mouth 4 (four) times daily as needed for diarrhea or loose stools. (Patient not taking: Reported on 01/19/2023)   loperamide (IMODIUM A-D) 2 MG tablet Take 2 mg by mouth every 6 (six) hours as needed for diarrhea or loose stools.   ondansetron (ZOFRAN) 4 MG tablet Take one tablet by mouth every 6 hours as needed.   pantoprazole (PROTONIX) 40 MG tablet Take 40 mg by mouth daily.   polyethylene glycol powder (GLYCOLAX/MIRALAX) 17 GM/SCOOP powder Take 17 g by mouth every 12 (twelve) hours as needed for mild constipation. And once daily in the morning scheduled   promethazine (PHENERGAN) 12.5 MG tablet Take 1 tablet (12.5 mg total) by mouth every 8 (eight) hours as needed for nausea or vomiting.   senna (SENOKOT) 8.6 MG TABS tablet Take 2 tablets by mouth at bedtime.   sertraline (ZOLOFT) 100 MG tablet Take 1 tablet (100 mg total) by mouth daily.   tamsulosin (FLOMAX) 0.4 MG CAPS capsule Take 0.4 mg by mouth daily after supper.   Zinc Oxide (TRIPLE PASTE) 12.8 % ointment Apply 1 Application topically as needed for irritation.   No facility-administered encounter medications on file as of 01/31/2023.    Review of Systems  Immunization History  Administered Date(s) Administered   Influenza Inj Mdck Quad Pf 11/30/2021   Influenza, High Dose Seasonal PF 12/16/2020   Influenza-Unspecified 12/06/2011, 11/27/2012, 01/19/2014, 12/11/2014, 12/03/2015, 12/10/2016, 12/18/2019   PFIZER Comirnaty(Gray Top)Covid-19 Tri-Sucrose Vaccine 03/13/2019, 04/03/2019, 12/05/2019   Pneumococcal Conjugate-13 12/31/2016, 01/05/2017   Pneumococcal Polysaccharide-23 01/05/2018   Td (Adult),5 Lf Tetanus Toxid, Preservative Free 07/16/2019   Tdap 06/18/2011   Unspecified SARS-COV-2 Vaccination 03/12/2019, 04/02/2019, 12/05/2019   Zoster Recombinant(Shingrix) 06/30/2016, 10/26/2017, 01/06/2018   Pertinent  Health Maintenance Due  Topic Date Due   INFLUENZA VACCINE  09/30/2022   DEXA SCAN  Completed       01/08/2022    7:58 PM 01/09/2022    9:00 AM 01/09/2022    8:25 PM 01/10/2022    8:50 AM 08/16/2022   10:39 AM  Fall Risk  Falls in the past year?     1  Was there an injury with Fall?     1  Fall Risk Category Calculator     3  (RETIRED) Patient Fall Risk Level Moderate fall risk High fall risk High fall risk High fall risk   Patient at Risk for Falls Due to     History of fall(s);Impaired balance/gait;Impaired mobility  Fall risk Follow up     Falls evaluation completed   Functional Status Survey:    Vitals:   01/31/23 0825  BP: 138/67  Pulse: 79  Resp: 20  Temp: 98.9 F (37.2 C)  SpO2: 96%  Weight: 126 lb 12.8 oz (57.5 kg)  Height: 5\' 3"  (1.6 m)   Body mass index is 22.46 kg/m. Physical Exam Constitutional:      Comments: Thin, frail  HENT:  Mouth/Throat:     Mouth: Mucous membranes are moist. Mucous membranes are pale.     Pharynx: Oropharynx is clear. Uvula midline.  Neurological:     Mental Status: She is alert.     Labs reviewed: Recent Labs    04/26/22 0320 05/01/22 1251 05/07/22 1052 05/08/22 0425 05/10/22 0657 05/19/22 1028 05/27/22 0000 06/08/22 0140 06/11/22 0949 06/21/22 0000 06/28/22 0000  NA 133*   < > 134* 135   < > 138   < > 137 137 137 139  K 3.5   < > 3.5 3.8   < > 3.7   < > 4.0 3.3* 4.0 4.2  CL 97*   < > 100 103   < > 103   < > 103 105 100 102  CO2 22   < > 23 23   < > 23   < > 23 21* 26* 27*  GLUCOSE 131*   < > 142* 100*   < > 152*  --  135* 141*  --   --   BUN 19   < > 13 10   < > 25*   < > 18 17 21 21   CREATININE 0.64   < > 0.72 0.54   < > 0.49   < > 0.62 0.52 0.7 0.6  CALCIUM 8.9   < > 8.5* 8.5*   < > 9.2   < > 9.5 9.2 9.7 9.5  MG 1.4*   < > 1.9 1.8  --   --   --   --  1.2*  --   --   PHOS 3.0  --   --   --   --   --   --   --   --   --   --    < > = values in this interval not displayed.   Recent Labs    04/25/22 0948 05/15/22 1404 06/21/22 0000  AST 26 23 13   ALT 23 15 12   ALKPHOS 43 62 63  BILITOT 0.9 0.5  --    PROT 7.0 6.5  --   ALBUMIN 4.1 3.0* 3.4*   Recent Labs    06/08/22 0140 06/09/22 0443 06/10/22 0440 06/11/22 0949 06/21/22 0000 06/28/22 0000  WBC 12.0* 7.1  --  8.1 9.2 8.4  NEUTROABS 8.2*  --   --   --  6,348.00 4,897.00  HGB 12.0 9.5*   < > 10.4* 10.4* 10.1*  HCT 38.0 29.8*   < > 32.3* 32* 32*  MCV 89.2 90.3  --  87.8  --   --   PLT 382 314  --  322 357 405*   < > = values in this interval not displayed.   No results found for: "TSH" Lab Results  Component Value Date   HGBA1C 5.6 04/26/2022   Lab Results  Component Value Date   CHOL 233 (H) 04/26/2022   HDL 53 04/26/2022   LDLCALC 122 (H) 04/26/2022   LDLDIRECT 114 (H) 04/26/2022   TRIG 292 (H) 04/26/2022   CHOLHDL 4.4 04/26/2022    Significant Diagnostic Results in last 30 days:  No results found.  Assessment/Plan Sore throat Patient with sore throat, negative covid test. Throat lozenges PRN. Throat clear no LAD. Defer further testing at this time.   Family/ staff Communication: spouse  Labs/tests ordered:  none

## 2023-03-01 ENCOUNTER — Non-Acute Institutional Stay: Payer: Self-pay | Admitting: Nurse Practitioner

## 2023-03-01 ENCOUNTER — Encounter: Payer: Self-pay | Admitting: Nurse Practitioner

## 2023-03-01 DIAGNOSIS — Z0000 Encounter for general adult medical examination without abnormal findings: Secondary | ICD-10-CM | POA: Diagnosis not present

## 2023-03-01 NOTE — Patient Instructions (Signed)
  Sherry [name] , Thank you for taking time to come for your Medicare Wellness Visit. I appreciate your ongoing commitment to your health goals. Please review the following plan we discussed and let me know if I can assist you in the future.     This is a list of the screening recommended for you and due dates:  Health Maintenance  Topic Date Due   COVID-19 Vaccine (7 - 2024-25 season) 10/31/2022   Medicare Annual Wellness Visit  02/29/2024   DTaP/Tdap/Td vaccine (3 - Td or Tdap) 07/15/2029   Pneumonia Vaccine  Completed   Flu Shot  Completed   DEXA scan (bone density measurement)  Completed   Zoster (Shingles) Vaccine  Completed   HPV Vaccine  Aged Out

## 2023-03-01 NOTE — Progress Notes (Signed)
 Subjective:   Sherry [name] is a 89 y.o. female who presents for Medicare Annual (Subsequent) preventive examination.  Visit Complete: In person   Cardiac Risk Factors include: advanced age (>46men, >62 women);sedentary lifestyle     Objective:    Today's Vitals   03/01/23 0925  BP: (!) 105/59  Pulse: 79  Resp: 18  Temp: (!) 97.2 F (36.2 C)  SpO2: 95%  Weight: 126 lb 12.8 oz (57.5 kg)  Height: 5' 2 (1.575 m)   Body mass index is 23.19 kg/m.     03/01/2023    9:39 AM 01/19/2023    9:48 AM 11/02/2022    2:36 PM 09/24/2022    1:40 PM 08/26/2022    3:00 PM 08/16/2022   10:31 AM 07/13/2022   12:33 PM  Advanced Directives  Does Patient Have a Medical Advance Directive? Yes Yes Yes Yes Yes Yes Yes  Type of Advance Directive Out of facility DNR (pink MOST or yellow form) Out of facility DNR (pink MOST or yellow form) Out of facility DNR (pink MOST or yellow form) Out of facility DNR (pink MOST or yellow form) Out of facility DNR (pink MOST or yellow form) Out of facility DNR (pink MOST or yellow form) Out of facility DNR (pink MOST or yellow form)  Does patient want to make changes to medical advance directive? No - Patient declined No - Patient declined No - Patient declined No - Patient declined No - Patient declined No - Patient declined No - Patient declined  Pre-existing out of facility DNR order (yellow form or pink MOST form)  Pink MOST form placed in chart (order not valid for inpatient use)         Current Medications (verified) Outpatient Encounter Medications as of 03/01/2023  Medication Sig   acetaminophen  (TYLENOL ) 500 MG tablet Take 1,000 mg by mouth every 8 (eight) hours as needed.   amLODipine  (NORVASC ) 5 MG tablet Take 1 tablet (5 mg total) by mouth daily.   aspirin  EC 81 MG tablet Take 1 tablet (81 mg total) by mouth daily. Swallow whole.   atorvastatin  (LIPITOR) 40 MG tablet Take 1 tablet (40 mg total) by mouth daily.   cholecalciferol  (VITAMIN D ) 25 MCG  (1000 UNIT) tablet Take 2,000 Units by mouth daily.   Cyanocobalamin  (B-12) 1000 MCG CAPS Take 2,000 mcg by mouth daily.   fluticasone (FLONASE) 50 MCG/ACT nasal spray Place 2 sprays into both nostrils daily.   labetalol  (NORMODYNE ) 200 MG tablet Take 200 mg by mouth 2 (two) times daily.   lactose free nutrition (BOOST) LIQD Take 237 mLs by mouth 3 (three) times daily between meals.   pantoprazole  (PROTONIX ) 40 MG tablet Take 40 mg by mouth daily.   senna (SENOKOT) 8.6 MG TABS tablet Take 2 tablets by mouth at bedtime.   sertraline  (ZOLOFT ) 100 MG tablet Take 1 tablet (100 mg total) by mouth daily.   tamsulosin  (FLOMAX ) 0.4 MG CAPS capsule Take 0.4 mg by mouth daily after supper.   Zinc  Oxide (TRIPLE PASTE) 12.8 % ointment Apply 1 Application topically as needed for irritation.   [DISCONTINUED] Baclofen  5 MG TABS Take 1 tablet (5 mg total) by mouth 2 (two) times daily as needed (HEADACHES AND HICCUPS).   [DISCONTINUED] loperamide  (IMODIUM  A-D) 2 MG tablet Take 1 tablet (2 mg total) by mouth 4 (four) times daily as needed for diarrhea or loose stools. (Patient not taking: Reported on 01/19/2023)   [DISCONTINUED] loperamide  (IMODIUM  A-D) 2 MG tablet Take  2 mg by mouth every 6 (six) hours as needed for diarrhea or loose stools.   [DISCONTINUED] ondansetron  (ZOFRAN ) 4 MG tablet Take one tablet by mouth every 6 hours as needed.   [DISCONTINUED] polyethylene glycol powder (GLYCOLAX /MIRALAX ) 17 GM/SCOOP powder Take 17 g by mouth every 12 (twelve) hours as needed for mild constipation. And once daily in the morning scheduled   [DISCONTINUED] promethazine  (PHENERGAN ) 12.5 MG tablet Take 1 tablet (12.5 mg total) by mouth every 8 (eight) hours as needed for nausea or vomiting.   No facility-administered encounter medications on file as of 03/01/2023.    Allergies (verified) Morphine, Buprenorphine hcl, Codeine, Epinephrine , Morphine and codeine, and Percocet [oxycodone -acetaminophen ]   History: Past  Medical History:  Diagnosis Date   Actinic keratosis    Breast cancer (HCC)    C1 cervical fracture (HCC)    Hypertension    Osteoarthritis    Past Surgical History:  Procedure Laterality Date   BACK SURGERY     BIOPSY  05/03/2022   Procedure: BIOPSY;  Surgeon: Leigh Elspeth SQUIBB, MD;  Location: [facility];  Service: Gastroenterology;;   ESOPHAGOGASTRODUODENOSCOPY (EGD) WITH PROPOFOL  N/A 05/03/2022   Procedure: ESOPHAGOGASTRODUODENOSCOPY (EGD) WITH PROPOFOL ;  Surgeon: Leigh Elspeth SQUIBB, MD;  Location: [facility] ENDOSCOPY;  Service: Gastroenterology;  Laterality: N/A;   HEMOSTASIS CONTROL  05/03/2022   Procedure: HEMOSTASIS CONTROL;  Surgeon: Leigh Elspeth SQUIBB, MD;  Location: [facility] ENDOSCOPY;  Service: Gastroenterology;;   IR INJECT/THERA/INC NEEDLE/CATH/PLC EPI/LUMB/SAC [facility]  04/29/2021   MOHS SURGERY     Family History  Problem Relation Age of Onset   Hypertension Father    Social History   Socioeconomic History   Marital status: Married    Spouse name: Not on file   Number of children: Not on file   Years of education: Not on file   Highest education level: Not on file  Occupational History   Not on file  Tobacco Use   Smoking status: Never    Passive exposure: Never   Smokeless tobacco: Never  Vaping Use   Vaping status: Never Used  Substance and Sexual Activity   Alcohol use: Yes    Comment: Occassionally   Drug use: No   Sexual activity: Not on file  Other Topics Concern   Not on file  Social History Narrative   Not on file   Social Drivers of Health   Financial Resource Strain: Low Risk  (05/22/2019)   Received from [facility], [facility]   Overall Financial Resource Strain (CARDIA)    Difficulty of Paying Living Expenses: Not hard at all  Food Insecurity: No Food Insecurity (05/15/2022)   Hunger Vital Sign    Worried About Running Out of Food in the Last Year: Never true    Ran Out of Food in the Last Year: Never true   Transportation Needs: No Transportation Needs (05/15/2022)   PRAPARE - Administrator, Civil Service (Medical): No    Lack of Transportation (Non-Medical): No  Physical Activity: Unknown (05/22/2019)   Received from [facility], [facility]   Exercise Vital Sign    Days of Exercise per Week: 0 days    Minutes of Exercise per Session: Not on file  Stress: Stress Concern Present (05/22/2019)   Received from [facility], [facility]   [employer] of Occupational Health - Occupational Stress Questionnaire    Feeling of Stress : To some extent  Social  Connections: Moderately Integrated (05/22/2019)   Received from [facility], [facility]   Social Connection and Isolation Panel [NHANES]    Frequency of Communication with Friends and Family: More than three times a week    Frequency of Social Gatherings with Friends and Family: Once a week    Attends Religious Services: More than 4 times per year    Active Member of [employer] or Organizations: No    Attends Banker Meetings: Never    Marital Status: Married    Tobacco Counseling Counseling given: Not Answered   Clinical Intake:                        Activities of Daily Living    03/01/2023   11:45 AM 06/08/2022    4:00 PM  In your present state of health, do you have any difficulty performing the following activities:  Hearing? 1 1  Vision? 0 0  Difficulty concentrating or making decisions? 1 1  Walking or climbing stairs? 1 1  Dressing or bathing? 1 1  Doing errands, shopping? 1   Preparing Food and eating ? Y   Using the Toilet? Y   In the past six months, have you accidently leaked urine? Y   Do you have problems with loss of bowel control? Y   Managing your Medications? Y   Managing your Finances? Y   Housekeeping or managing your Housekeeping? Y     Patient Care  Team: Abdul Fine, MD as PCP - General (Family Medicine) [name], MD as Consulting Physician (Cardiology)  Indicate any recent Medical Services you may have received from other than [facility] providers in the past year (date may be approximate).     Assessment:   This is a routine wellness examination for Tonette.  Hearing/Vision screen No results found.   Goals Addressed   None    Depression Screen     No data to display          Fall Risk    03/01/2023   11:44 AM 08/16/2022   10:39 AM  Fall Risk   Falls in the past year? 1 1  Number falls in past yr: 1 1  Injury with Fall? 1 1  Risk for fall due to : History of fall(s);Impaired balance/gait;Impaired mobility History of fall(s);Impaired balance/gait;Impaired mobility  Follow up Education provided;Falls prevention discussed Falls evaluation completed    MEDICARE RISK AT HOME:    TIMED UP AND GO:  Was the test performed?  No    Cognitive Function:        Immunizations Immunization History  Administered Date(s) Administered   Influenza Inj Mdck Quad Pf 11/30/2021   Influenza, High Dose Seasonal PF 12/16/2020   Influenza-Unspecified 12/06/2011, 11/27/2012, 01/19/2014, 12/11/2014, 12/03/2015, 12/10/2016, 12/18/2019, 12/22/2022   PFIZER Comirnaty(Gray Top)Covid-19 Tri-Sucrose Vaccine 03/13/2019, 04/03/2019, 12/05/2019   Pneumococcal Conjugate-13 12/31/2016, 01/05/2017   Pneumococcal Polysaccharide-23 01/05/2018   Td (Adult),5 Lf Tetanus Toxid, Preservative Free 07/16/2019   Tdap 06/18/2011   Unspecified SARS-COV-2 Vaccination 03/12/2019, 04/02/2019, 12/05/2019   Zoster Recombinant(Shingrix) 06/30/2016, 10/26/2017, 01/06/2018    TDAP status: Up to date  Flu Vaccine status: Up to date  Pneumococcal vaccine status: Up to date  Covid-19 vaccine status: Information provided on how to obtain vaccines.   Qualifies for Shingles Vaccine? Yes   Zostavax completed No   Shingrix Completed?:  Yes  Screening Tests Health Maintenance  Topic Date Due   COVID-19  Vaccine (7 - 2024-25 season) 10/31/2022   Medicare Annual Wellness (AWV)  02/29/2024   DTaP/Tdap/Td (3 - Td or Tdap) 07/15/2029   Pneumonia Vaccine 72+ Years old  Completed   INFLUENZA VACCINE  Completed   DEXA SCAN  Completed   Zoster Vaccines- Shingrix  Completed   HPV VACCINES  Aged Out    Health Maintenance  Health Maintenance Due  Topic Date Due   COVID-19 Vaccine (7 - 2024-25 season) 10/31/2022    Colorectal cancer screening: No longer required.   Mammogram status: No longer required due to aged out.   Lung Cancer Screening: (Low Dose CT Chest recommended if Age 61-80 years, 20 pack-year currently smoking OR have quit w/in 15years.) does not qualify.   Lung Cancer Screening Referral: na  Additional Screening:  Hepatitis C Screening: does not qualify; Completed na  Vision Screening: Recommended annual ophthalmology exams for early detection of glaucoma and other disorders of the eye. Is the patient up to date with their annual eye exam?  No  Who is the provider or what is the name of the office in which the patient attends annual eye exams? If pt is not established with a provider, would they like to be referred to a provider to establish care? No .   Dental Screening: Recommended annual dental exams for proper oral hygiene    Community Resource Referral / Chronic Care Management: CRR required this visit?  No   CCM required this visit?  No     Plan:     I have personally reviewed and noted the following in the patient's chart:   Medical and social history Use of alcohol, tobacco or illicit drugs  Current medications and supplements including opioid prescriptions. Patient is not currently taking opioid prescriptions. Functional ability and status Nutritional status Physical activity Advanced directives List of other physicians Hospitalizations, surgeries, and ER visits in previous 12  months Vitals Screenings to include cognitive, depression, and falls Referrals and appointments  In addition, I have reviewed and discussed with patient certain preventive protocols, quality metrics, and best practice recommendations. A written personalized care plan for preventive services as well as general preventive health recommendations were provided to patient.     Harlene MARLA An, NP   03/01/2023

## 2023-03-17 ENCOUNTER — Encounter: Payer: Self-pay | Admitting: Nurse Practitioner

## 2023-03-17 ENCOUNTER — Non-Acute Institutional Stay (SKILLED_NURSING_FACILITY): Payer: Medicare Other | Admitting: Nurse Practitioner

## 2023-03-17 DIAGNOSIS — E440 Moderate protein-calorie malnutrition: Secondary | ICD-10-CM

## 2023-03-17 DIAGNOSIS — K219 Gastro-esophageal reflux disease without esophagitis: Secondary | ICD-10-CM

## 2023-03-17 DIAGNOSIS — I69319 Unspecified symptoms and signs involving cognitive functions following cerebral infarction: Secondary | ICD-10-CM | POA: Diagnosis not present

## 2023-03-17 DIAGNOSIS — K5900 Constipation, unspecified: Secondary | ICD-10-CM

## 2023-03-17 DIAGNOSIS — I5032 Chronic diastolic (congestive) heart failure: Secondary | ICD-10-CM | POA: Diagnosis not present

## 2023-03-17 DIAGNOSIS — E785 Hyperlipidemia, unspecified: Secondary | ICD-10-CM

## 2023-03-17 DIAGNOSIS — R296 Repeated falls: Secondary | ICD-10-CM

## 2023-03-17 DIAGNOSIS — I10 Essential (primary) hypertension: Secondary | ICD-10-CM

## 2023-03-17 DIAGNOSIS — F331 Major depressive disorder, recurrent, moderate: Secondary | ICD-10-CM

## 2023-03-17 DIAGNOSIS — D649 Anemia, unspecified: Secondary | ICD-10-CM

## 2023-03-17 NOTE — Progress Notes (Signed)
Location:  Other [place].  Nursing Home Room Number: [facility] [address] Place of Service:  SNF [phone]) Abbey Chatters, NP  PCP: Earnestine Mealing, MD  Patient Care Team: Earnestine Mealing, MD as PCP - General (Family Medicine) Mariah Milling Tollie Pizza, MD as Consulting Physician (Cardiology)  Extended Emergency Contact Information Primary Emergency Contact: Facemire,james Sr. Home Phone: [phone] Mobile Phone: [phone] Relation: Spouse Secondary Emergency Contact: Asa Lente Home Phone: [phone] Mobile Phone: [phone] Relation: Son  Goals of care: Advanced Directive information    03/17/2023    1:11 PM  Advanced Directives  Does Patient Have a Medical Advance Directive? Yes  Type of Advance Directive Out of facility DNR (pink MOST or yellow form)  Does patient want to make changes to medical advance directive? No - Patient declined     Chief Complaint  Patient presents with   Medical Management of Chronic Issues    Medical Management of Chronic Issues.     HPI:  Pt is a 89 y.o. female seen today for medical management of chronic disease.  Pt with hx of frequent falls, cva, dementia, protein cal malnutrition, depression, hyperlipidemia, htn, anemia and other.  She has had hx of frequent falls but stays the bed most of the time now and has not had a fall. Her weight has been stable.  She denies pains or discomfort.  Denies anxiety or depression. Nursing has no acute concerns at this time.    Past Medical History:  Diagnosis Date   Actinic keratosis    Breast cancer (HCC)    C1 cervical fracture (HCC)    Hypertension    Osteoarthritis    Past Surgical History:  Procedure Laterality Date   BACK SURGERY     BIOPSY  05/03/2022   Procedure: BIOPSY;  Surgeon: Benancio Deeds, MD;  Location: [facility] ENDOS[facility];  Service: Gastroenterology;;   ESOPHAGOGASTRODUODENOSCOPY (EGD) WITH PROPOFOL N/A 05/03/2022   Procedure: ESOPHAGOGASTRODUODENOSCOPY (EGD) WITH  PROPOFOL;  Surgeon: Benancio Deeds, MD;  Location: [facility] ENDOSCOPY;  Service: Gastroenterology;  Laterality: N/A;   HEMOSTASIS CONTROL  05/03/2022   Procedure: HEMOSTASIS CONTROL;  Surgeon: Benancio Deeds, MD;  Location: [facility] ENDOSCOPY;  Service: Gastroenterology;;   IR INJECT/THERA/INC NEEDLE/CATH/PLC EPI/LUMB/SAC [facility]  04/29/2021   MOHS SURGERY      Allergies  Allergen Reactions   Morphine     Other reaction(s): Other (See Comments) Apnea with large dose Respiratory Distress    Buprenorphine Hcl     Other reaction(s): Other (See Comments), Other (See Comments) Respiratory Distress Respiratory Distress    Codeine    Epinephrine     Other reaction(s): Other (See Comments) Severe anxiety; "makes my heart race" - would like to avoid during surgery   Morphine And Codeine Other (See Comments)    Respiratory Distress    Percocet [Oxycodone-Acetaminophen]     Outpatient Encounter Medications as of 03/17/2023  Medication Sig   acetaminophen (TYLENOL) 500 MG tablet Take 1,000 mg by mouth every 8 (eight) hours as needed.   amLODipine (NORVASC) 5 MG tablet Take 1 tablet (5 mg total) by mouth daily.   aspirin EC 81 MG tablet Take 1 tablet (81 mg total) by mouth daily. Swallow whole.   atorvastatin (LIPITOR) 40 MG tablet Take 1 tablet (40 mg total) by mouth daily.   cholecalciferol (VITAMIN D) 25 MCG (1000 UNIT) tablet Take 2,000 Units by mouth daily.   Cyanocobalamin (B-12) 1000 MCG CAPS Take 2,000 mcg by mouth daily.   fluticasone (FLONASE) 50 MCG/ACT nasal  spray Place 2 sprays into both nostrils daily.   labetalol (NORMODYNE) 200 MG tablet Take 200 mg by mouth 2 (two) times daily.   lactose free nutrition (BOOST) LIQD Take 237 mLs by mouth 3 (three) times daily between meals.   pantoprazole (PROTONIX) 40 MG tablet Take 40 mg by mouth daily.   senna (SENOKOT) 8.6 MG TABS tablet Take 2 tablets by mouth at bedtime.   sertraline (ZOLOFT) 100 MG tablet Take 1 tablet (100 mg total) by  mouth daily.   tamsulosin (FLOMAX) 0.4 MG CAPS capsule Take 0.4 mg by mouth daily after supper.   Zinc Oxide (TRIPLE PASTE) 12.8 % ointment Apply 1 Application topically as needed for irritation.   No facility-administered encounter medications on file as of 03/17/2023.    Review of Systems  Unable to perform ROS: Dementia     Immunization History  Administered Date(s) Administered   Influenza Inj Mdck Quad Pf 11/30/2021   Influenza, High Dose Seasonal PF 12/16/2020   Influenza-Unspecified 12/06/2011, 11/27/2012, 01/19/2014, 12/11/2014, 12/03/2015, 12/10/2016, 12/18/2019, 12/22/2022   PFIZER Comirnaty(Gray Top)Covid-19 Tri-Sucrose Vaccine 03/13/2019, 04/03/2019, 12/05/2019   Pneumococcal Conjugate-13 12/31/2016, 01/05/2017   Pneumococcal Polysaccharide-23 01/05/2018   Td (Adult),5 Lf Tetanus Toxid, Preservative Free 07/16/2019   Tdap 06/18/2011   Unspecified SARS-COV-2 Vaccination 03/12/2019, 04/02/2019, 12/05/2019   Zoster Recombinant(Shingrix) 06/30/2016, 10/26/2017, 01/06/2018   Pertinent  Health Maintenance Due  Topic Date Due   INFLUENZA VACCINE  Completed   DEXA SCAN  Completed      01/09/2022    9:00 AM 01/09/2022    8:25 PM 01/10/2022    8:50 AM 08/16/2022   10:39 AM 03/01/2023   11:44 AM  Fall Risk  Falls in the past year?    1 1  Was there an injury with Fall?    1 1  Fall Risk Category Calculator    3 3  (RETIRED) Patient Fall Risk Level High fall risk High fall risk High fall risk    Patient at Risk for Falls Due to    History of fall(s);Impaired balance/gait;Impaired mobility History of fall(s);Impaired balance/gait;Impaired mobility  Fall risk Follow up    Falls evaluation completed Education provided;Falls prevention discussed   Functional Status Survey:    Vitals:   03/17/23 1307  BP: 122/62  Pulse: 71  Resp: 14  Temp: 97.6 F (36.4 C)  SpO2: 91%  Weight: 129 lb 6.4 oz (58.7 kg)  Height: 5\' 2"  (1.575 m)   Body mass index is 23.67  kg/m. Physical Exam Constitutional:      General: She is not in acute distress.    Appearance: She is well-developed. She is not diaphoretic.  HENT:     Head: Normocephalic and atraumatic.     Mouth/Throat:     Pharynx: No oropharyngeal exudate.  Eyes:     Conjunctiva/sclera: Conjunctivae normal.     Pupils: Pupils are equal, round, and reactive to light.  Cardiovascular:     Rate and Rhythm: Normal rate and regular rhythm.     Heart sounds: Normal heart sounds.  Pulmonary:     Effort: Pulmonary effort is normal.     Breath sounds: Normal breath sounds.  Abdominal:     General: Bowel sounds are normal.     Palpations: Abdomen is soft.  Musculoskeletal:     Cervical back: Normal range of motion and neck supple.     Right lower leg: No edema.     Left lower leg: No edema.  Skin:  General: Skin is warm and dry.  Neurological:     Mental Status: She is alert. Mental status is at baseline.     Motor: Weakness present.     Gait: Gait abnormal.  Psychiatric:        Mood and Affect: Mood normal.     Labs reviewed: Recent Labs    04/26/22 0320 05/01/22 1251 05/07/22 1052 05/08/22 0425 05/10/22 0657 05/19/22 1028 05/27/22 0000 06/08/22 0140 06/11/22 0949 06/21/22 0000 06/28/22 0000  NA 133*   < > 134* 135   < > 138   < > 137 137 137 139  K 3.5   < > 3.5 3.8   < > 3.7   < > 4.0 3.3* 4.0 4.2  CL 97*   < > 100 103   < > 103   < > 103 105 100 102  CO2 22   < > 23 23   < > 23   < > 23 21* 26* 27*  GLUCOSE 131*   < > 142* 100*   < > 152*  --  135* 141*  --   --   BUN 19   < > 13 10   < > 25*   < > 18 17 21 21   CREATININE 0.64   < > 0.72 0.54   < > 0.49   < > 0.62 0.52 0.7 0.6  CALCIUM 8.9   < > 8.5* 8.5*   < > 9.2   < > 9.5 9.2 9.7 9.5  MG 1.4*   < > 1.9 1.8  --   --   --   --  1.2*  --   --   PHOS 3.0  --   --   --   --   --   --   --   --   --   --    < > = values in this interval not displayed.   Recent Labs    04/25/22 0948 05/15/22 1404 06/21/22 0000  AST 26  23 13   ALT 23 15 12   ALKPHOS 43 62 63  BILITOT 0.9 0.5  --   PROT 7.0 6.5  --   ALBUMIN 4.1 3.0* 3.4*   Recent Labs    06/08/22 0140 06/09/22 0443 06/10/22 0440 06/11/22 0949 06/21/22 0000 06/28/22 0000  WBC 12.0* 7.1  --  8.1 9.2 8.4  NEUTROABS 8.2*  --   --   --  6,348.00 4,897.00  HGB 12.0 9.5*   < > 10.4* 10.4* 10.1*  HCT 38.0 29.8*   < > 32.3* 32* 32*  MCV 89.2 90.3  --  87.8  --   --   PLT 382 314  --  322 357 405*   < > = values in this interval not displayed.   No results found for: "TSH" Lab Results  Component Value Date   HGBA1C 5.6 04/26/2022   Lab Results  Component Value Date   CHOL 233 (H) 04/26/2022   HDL 53 04/26/2022   LDLCALC 122 (H) 04/26/2022   LDLDIRECT 114 (H) 04/26/2022   TRIG 292 (H) 04/26/2022   CHOLHDL 4.4 04/26/2022    Significant Diagnostic Results in last 30 days:  No results found.  Assessment/Plan 1. Chronic diastolic CHF (congestive heart failure) (HCC) (Primary) -euvolemic will monitor for fluid overload.   2. Hypertension, unspecified type -Blood pressure well controlled, goal bp <140/90 Continue current medications and dietary modifications follow metabolic panel  3. Protein-calorie malnutrition, moderate (  HCC) -weight has been stable, continue on current regimen  4. CVA, old, cognitive deficits Stable at this time, continues on ASA   5. Moderate episode of recurrent major depressive disorder (HCC) -mood has been stable on zoloft. Will continue current regimen   6. Recurrent falls -stable, continues to be high fall risk but precautions in place  7. Anemia, unspecified type Follow up cbc  8. Gastroesophageal reflux disease, unspecified whether esophagitis present Stable on protonix, no complaints of worsening symptoms  9. Hyperlipidemia, unspecified hyperlipidemia type -follow up lipids, continues on lipitor 40 mg daily  10. Constipation, unspecified constipation type Controlled on senokot 2 tablet at bedtime.       Janene Harvey. [facility] [phone]

## 2023-03-21 LAB — BASIC METABOLIC PANEL
BUN: 19 (ref 4–21)
CO2: 31 — AB (ref 13–22)
Chloride: 103 (ref 99–108)
Creatinine: 0.7 (ref 0.5–1.1)
Glucose: 75
Potassium: 4.4 meq/L (ref 3.5–5.1)
Sodium: 142 (ref 137–147)

## 2023-03-21 LAB — LIPID PANEL
Cholesterol: 123 (ref 0–200)
HDL: 45 (ref 35–70)
LDL Cholesterol: 53
Triglycerides: 176 — AB (ref 40–160)

## 2023-03-21 LAB — CBC AND DIFFERENTIAL
HCT: 35 — AB (ref 36–46)
Hemoglobin: 11.1 — AB (ref 12.0–16.0)
Neutrophils Absolute: 3454
Platelets: 240 10*3/uL (ref 150–400)
WBC: 6.8

## 2023-03-21 LAB — HEPATIC FUNCTION PANEL
ALT: 12 U/L (ref 7–35)
AST: 16 (ref 13–35)
Alkaline Phosphatase: 53 (ref 25–125)
Bilirubin, Total: 0.4

## 2023-03-21 LAB — COMPREHENSIVE METABOLIC PANEL
Albumin: 3.7 (ref 3.5–5.0)
Calcium: 9.4 (ref 8.7–10.7)
Globulin: 1.8
eGFR: 82

## 2023-03-21 LAB — CBC: RBC: 4.12 (ref 3.87–5.11)

## 2023-04-10 ENCOUNTER — Emergency Department: Payer: Medicare Other

## 2023-04-10 ENCOUNTER — Other Ambulatory Visit: Payer: Self-pay

## 2023-04-10 ENCOUNTER — Emergency Department
Admission: EM | Admit: 2023-04-10 | Discharge: 2023-04-10 | Disposition: A | Discharge status: Home / Self Care | Payer: Medicare Other | Attending: Emergency Medicine | Admitting: Emergency Medicine

## 2023-04-10 DIAGNOSIS — I110 Hypertensive heart disease with heart failure: Secondary | ICD-10-CM | POA: Diagnosis not present

## 2023-04-10 DIAGNOSIS — S0101XA Laceration without foreign body of scalp, initial encounter: Secondary | ICD-10-CM | POA: Diagnosis not present

## 2023-04-10 DIAGNOSIS — F0390 Unspecified dementia without behavioral disturbance: Secondary | ICD-10-CM | POA: Insufficient documentation

## 2023-04-10 DIAGNOSIS — W19XXXA Unspecified fall, initial encounter: Secondary | ICD-10-CM | POA: Diagnosis not present

## 2023-04-10 DIAGNOSIS — Z8781 Personal history of (healed) traumatic fracture: Secondary | ICD-10-CM | POA: Diagnosis not present

## 2023-04-10 DIAGNOSIS — I5030 Unspecified diastolic (congestive) heart failure: Secondary | ICD-10-CM | POA: Insufficient documentation

## 2023-04-10 DIAGNOSIS — S0990XA Unspecified injury of head, initial encounter: Secondary | ICD-10-CM | POA: Diagnosis present

## 2023-04-10 DIAGNOSIS — M79642 Pain in left hand: Secondary | ICD-10-CM | POA: Diagnosis not present

## 2023-04-10 LAB — COMPREHENSIVE METABOLIC PANEL
ALT: 19 U/L (ref 0–44)
AST: 28 U/L (ref 15–41)
Albumin: 3.7 g/dL (ref 3.5–5.0)
Alkaline Phosphatase: 57 U/L (ref 38–126)
Anion gap: 13 (ref 5–15)
BUN: 28 mg/dL — ABNORMAL HIGH (ref 8–23)
CO2: 25 mmol/L (ref 22–32)
Calcium: 9.5 mg/dL (ref 8.9–10.3)
Chloride: 99 mmol/L (ref 98–111)
Creatinine, Ser: 0.7 mg/dL (ref 0.44–1.00)
GFR, Estimated: >60 mL/min (ref >60)
Glucose, Bld: 100 mg/dL — ABNORMAL HIGH (ref 70–99)
Potassium: 4 mmol/L (ref 3.5–5.1)
Sodium: 137 mmol/L (ref 135–145)
Total Bilirubin: 0.6 mg/dL (ref 0.0–1.2)
Total Protein: 6.5 g/dL (ref 6.5–8.1)

## 2023-04-10 LAB — CBC WITH DIFFERENTIAL/PLATELET
Abs Immature Granulocytes: 0.06 10*3/uL (ref 0.00–0.07)
Basophils Absolute: 0.1 10*3/uL (ref 0.0–0.1)
Basophils Relative: 1 %
Eosinophils Absolute: 0.2 10*3/uL (ref 0.0–0.5)
Eosinophils Relative: 2 %
HCT: 36.7 % (ref 36.0–46.0)
Hemoglobin: 11.9 g/dL — ABNORMAL LOW (ref 12.0–15.0)
Immature Granulocytes: 1 %
Lymphocytes Relative: 26 %
Lymphs Abs: 2.4 10*3/uL (ref 0.7–4.0)
MCH: 26.4 pg (ref 26.0–34.0)
MCHC: 32.4 g/dL (ref 30.0–36.0)
MCV: 81.6 fL (ref 80.0–100.0)
Monocytes Absolute: 0.8 10*3/uL (ref 0.1–1.0)
Monocytes Relative: 8 %
Neutro Abs: 5.6 10*3/uL (ref 1.7–7.7)
Neutrophils Relative %: 62 %
Platelets: 280 10*3/uL (ref 150–400)
RBC: 4.5 MIL/uL (ref 3.87–5.11)
RDW: 14.8 % (ref 11.5–15.5)
WBC: 9.1 10*3/uL (ref 4.0–10.5)
nRBC: 0 % (ref 0.0–0.2)

## 2023-04-10 MED ORDER — LIDOCAINE-EPINEPHRINE 2 %-1:100000 IJ SOLN
20.0000 mL | Freq: Once | INTRAMUSCULAR | Status: AC
  Administered 2023-04-10: 20 mL
  Filled 2023-04-10: qty 1

## 2023-04-10 MED ORDER — TETANUS-DIPHTH-ACELL PERTUSSIS 5-2.5-18.5 LF-MCG/0.5 IM SUSY: 0.5000 mL | PREFILLED_SYRINGE | Freq: Once | INTRAMUSCULAR | Status: DC

## 2023-04-10 NOTE — ED Provider Notes (Signed)
 [facility] Provider Note    Event Date/Time   First MD Initiated Contact with Patient 04/10/23 1750     (approximate)   History   Chief Complaint Fall   HPI  Sherry [name] is a 89 y.o. female with past medical history of hypertension, hyperlipidemia, diastolic CHF, stroke, and dementia who presents to the ED following fall.  Per EMS, patient had an unwitnessed fall at her nursing facility earlier this evening.  She was found to have a cut to her frontal scalp, has been at her baseline per EMS.  Patient reports that she tripped and fell over the doorway, denies any loss of consciousness.  Patient currently complains of pain in her neck as well as a headache, additionally complains of some pain to her left hand.  She denies any pain in her chest, abdomen, or lower extremities.  She does not take any blood thinners.     Physical Exam   Triage Vital Signs: ED Triage Vitals  Encounter Vitals Group     BP 04/10/23 1754 (!) 147/64     Systolic BP Percentile --      Diastolic BP Percentile --      Pulse Rate 04/10/23 1722 65     Resp 04/10/23 1722 18     Temp 04/10/23 1722 98.4 F (36.9 C)     Temp src --      SpO2 04/10/23 1722 97 %     Weight 04/10/23 1721 142 lb 1.6 oz (64.5 kg)     Height 04/10/23 1721 5' 2 (1.575 m)     Head Circumference --      Peak Flow --      Pain Score 04/10/23 1720 5     Pain Loc --      Pain Education --      Exclude from Growth Chart --     Most recent vital signs: Vitals:   04/10/23 1722 04/10/23 1754  BP:  (!) 147/64  Pulse: 65   Resp: 18   Temp: 98.4 F (36.9 C)   SpO2: 97%     Constitutional: Awake and alert. Eyes: Conjunctivae are normal. Head: Approximately 4 cm laceration to left frontal scalp.  No scalp hematomas or step-offs noted. Nose: No congestion/rhinnorhea. Mouth/Throat: Mucous membranes are moist.  Neck: Cervical collar in place, midline cervical spine tenderness to palpation  noted. Cardiovascular: Normal rate, regular rhythm. Grossly normal heart sounds.  2+ radial pulses bilaterally. Respiratory: Normal respiratory effort.  No retractions. Lungs CTAB.  No chest wall tenderness to palpation. Gastrointestinal: Soft and nontender. No distention. Musculoskeletal: No lower extremity tenderness nor edema.  Diffuse tenderness to palpation over the dorsum of left hand with no obvious deformity. Neurologic:  Normal speech and language. No gross focal neurologic deficits are appreciated.    ED Results / Procedures / Treatments   Labs (all labs ordered are listed, but only abnormal results are displayed) Labs Reviewed  CBC WITH DIFFERENTIAL/PLATELET - Abnormal; Notable for the following components:      Result Value   Hemoglobin 11.9 (*)    All other components within normal limits  COMPREHENSIVE METABOLIC PANEL - Abnormal; Notable for the following components:   Glucose, Bld 100 (*)    BUN 28 (*)    All other components within normal limits     EKG  ED ECG REPORT I, Carlin Palin, the attending physician, personally viewed and interpreted this ECG.   Date: 04/10/2023  EKG Time: 17:25  Rate: 64  Rhythm: normal sinus rhythm  Axis: LAD  Intervals:first-degree A-V block , right bundle branch block, and left anterior fascicular block  ST&T Change: None  RADIOLOGY CT head reviewed and interpreted by me with no hemorrhage or midline shift.  PROCEDURES:  Critical Care performed: No  .Laceration Repair  Date/Time: 04/10/2023 7:02 PM  Performed by: Willo Dunnings, MD Authorized by: Willo Dunnings, MD   Consent:    Consent obtained:  Verbal   Consent given by:  Spouse and patient Universal protocol:    Patient identity confirmed:  Verbally with patient and arm band Anesthesia:    Anesthesia method:  Local infiltration   Local anesthetic:  Lidocaine  2% WITH epi Laceration details:    Location:  Scalp   Scalp location:  Frontal   Length (cm):   3 Pre-procedure details:    Preparation:  Patient was prepped and draped in usual sterile fashion and imaging obtained to evaluate for foreign bodies Exploration:    Limited defect created (wound extended): no     Hemostasis achieved with:  Epinephrine  and direct pressure   Imaging obtained comment:  CT   Imaging outcome: foreign body not noted     Wound exploration: wound explored through full range of motion and entire depth of wound visualized     Wound extent: areolar tissue not violated, fascia not violated, no foreign body, no signs of injury, no nerve damage, no tendon damage, no underlying fracture and no vascular damage     Contaminated: no   Treatment:    Area cleansed with:  Saline   Amount of cleaning:  Standard   Irrigation solution:  Sterile saline   Irrigation method:  Pressure wash   Visualized foreign bodies/material removed: no     Debridement:  None   Undermining:  None   Scar revision: no   Skin repair:    Repair method:  Sutures   Suture size:  4-0   Suture material:  Nylon   Suture technique:  Simple interrupted   Number of sutures:  3 Approximation:    Approximation:  Close Repair type:    Repair type:  Simple Post-procedure details:    Dressing:  Open (no dressing)   Procedure completion:  Tolerated well, no immediate complications    MEDICATIONS ORDERED IN ED: Medications  lidocaine -EPINEPHrine  (XYLOCAINE  W/EPI) 2 %-1:100000 (with pres) injection 20 mL (has no administration in time range)     IMPRESSION / MDM / ASSESSMENT AND PLAN / ED COURSE  I reviewed the triage vital signs and the nursing notes.                              89 y.o. female with past medical history of hypertension, hyperlipidemia, stroke, diastolic CHF, and dementia who presents to the ED following unwitnessed fall at her nursing facility this evening.  Patient's presentation is most consistent with acute presentation with potential threat to life or bodily  function.  Differential diagnosis includes, but is not limited to, intracranial process, cervical spine injury, fracture, dislocation, laceration.  Patient nontoxic-appearing and in no acute distress, vital signs are unremarkable.  She is at her baseline mental status, reports that she tripped and fell over the doorway.  She does have a laceration to her frontal scalp, will further assess with CT imaging of her head and cervical spine.  We will also check x-ray of her left hand, otherwise no evidence of traumatic injury  to her trunk or lower extremities.  Labs are reassuring with no significant anemia, leukocytosis, electrolyte abnormality, or AKI.  LFTs are unremarkable.  Patient's tetanus was updated in 2021.  CT head is negative for acute process, CT cervical spine also negative for acute process although patient does have chronic unhealed fracture of C1 and C2.  Findings discussed with Dr. Deatrice of neurosurgery, who states that patient should wear neck brace as possible and follow-up as an outpatient.  Laceration to frontal scalp repaired and patient appropriate for discharge home with outpatient follow-up.  Findings reviewed with her husband over the phone, who is in agreement with plan.      FINAL CLINICAL IMPRESSION(S) / ED DIAGNOSES   Final diagnoses:  Fall, initial encounter  Laceration of scalp, initial encounter  History of cervical fracture     Rx / DC Orders   ED Discharge Orders     None        Note:  This document was prepared using Dragon voice recognition software and may include unintentional dictation errors.   Willo Dunnings, MD 04/10/23 [id number]

## 2023-04-10 NOTE — ED Notes (Signed)
 Called to [facility] for pt transport to [facility] @730p .

## 2023-04-10 NOTE — Progress Notes (Signed)
 Neurosurgery brief note Was contacted by the emergency room regarding this patient.  She apparently 89 year old woman with a known history of a C1 ring fracture as well as a type II dens fracture previous identified in a cervical spine CT scan several years ago and at that time had been recommended to have a hard cervical collar with follow-up with neurosurgery.  This follow-up never recurred and she is not wearing a cervical collar and presentation.  She presents again today by report after an unwitnessed fall at her nursing facility this evening.  Per report she is neurologically intact at her baseline with mild dementia.  IMPRESSION: CT head:   1. No evidence of acute abnormality intracranially. 2. Remote infarcts and chronic microvascular ischemic disease.   CT cervical spine:   1. No evidence of acute fracture or traumatic malalignment. 2. Redemonstrated type 2 dens fracture and bilateral posterior arch of C1 fractures. Unchanged alignment.     Electronically Signed   By: Gilmore GORMAN Molt M.D.   On: 04/10/2023 18:26  AP: Patient has stable but still present type II dens fracture bilateral posterior arch of C1 fractures.  The alignment has been unchanged.  I recommended hard cervical orthosis at all times with activity ad lib. with obvious care to prevent additional falls though I suspect that wearing this orthosis will not be functionally practical for the patient given her age and living conditions.  We will arrange outpatient neurosurgery follow-up  Belvie PARAS. Deatrice, MD Neurosurgery

## 2023-04-10 NOTE — ED Triage Notes (Addendum)
 Pt in via [facility] from [facility] for unwitnessed fall this afternoon. Pt has hx of dementia. No blood thinners. But does take aspirin . Pt c/o neck pain. Pain to the front and back of head. Pt states that she fell forwards when she tripped over the door frame.

## 2023-04-11 ENCOUNTER — Non-Acute Institutional Stay (SKILLED_NURSING_FACILITY): Payer: Medicare Other | Admitting: Student

## 2023-04-11 ENCOUNTER — Encounter: Payer: Self-pay | Admitting: Student

## 2023-04-11 DIAGNOSIS — R296 Repeated falls: Secondary | ICD-10-CM

## 2023-04-11 DIAGNOSIS — S12001D Unspecified nondisplaced fracture of first cervical vertebra, subsequent encounter for fracture with routine healing: Secondary | ICD-10-CM | POA: Diagnosis not present

## 2023-04-11 DIAGNOSIS — I69319 Unspecified symptoms and signs involving cognitive functions following cerebral infarction: Secondary | ICD-10-CM

## 2023-04-12 ENCOUNTER — Telehealth: Payer: Self-pay

## 2023-04-12 NOTE — Telephone Encounter (Signed)
The ER called Dr Madaline Brilliant about this patient when he was covering call for our department.

## 2023-04-12 NOTE — Progress Notes (Unsigned)
Location:  Other Nursing Home Room Number: [facility] [address] Place of Service:  SNF [phone]) Provider:  Ander Gaster, Benetta Spar, MD  Patient Care Team: Earnestine Mealing, MD as PCP - General (Family Medicine) Antonieta Iba, MD as Consulting Physician (Cardiology)  Extended Emergency Contact Information Primary Emergency Contact: Spagnoli,james Sr. Home Phone: [phone] Mobile Phone: [phone] Relation: Spouse Secondary Emergency Contact: Asa Lente Home Phone: [phone] Mobile Phone: [phone] Relation: Son  Code Status:  DNR Goals of care: Advanced Directive information    04/10/2023    5:23 PM  Advanced Directives  Does Patient Have a Medical Advance Directive? Yes  Type of Advance Directive Out of facility DNR (pink MOST or yellow form)     Chief Complaint  Patient presents with   Medical Management of chronic condition    HPI:  Pt is a 89 y.o. female seen today for medical management of chronic diseases.   History of Present Illness The patient presents with a recent fall resulting in a forehead laceration and cervical spine fracture.  She experienced a fall yesterday while attempting to go to the bathroom, resulting in a forehead laceration that required two or three stitches. She is unsure of the exact cause of the fall but mentions tripping over something.  She has a known cervical spine fracture that has been present for some time. An x-ray done a few weeks ago showed the fracture was stable but not healing. She currently does not experience neck pain.  She has some discomfort in her left hand, which is swollen and bruised, likely due to the fall. No other pain is reported.  She confirms she has been eating well, drinking plenty of fluids, and having regular bowel movements. No neck pain, sweating, or feeling hot. She is not wet.  Past Medical History - Cervical spine fracture  Physical Exam HEENT: Cut on forehead with stitches, 3 in  number. MUSCULOSKELETAL: Cervical spine fracture present. SKIN: Left hand swollen and bruised.  Results RADIOLOGY Cervical spine X-ray: Non-healing stable cervical spine fracture   Past Medical History:  Diagnosis Date   Actinic keratosis    Breast cancer (HCC)    C1 cervical fracture (HCC)    Hypertension    Osteoarthritis    Past Surgical History:  Procedure Laterality Date   BACK SURGERY     BIOPSY  05/03/2022   Procedure: BIOPSY;  Surgeon: Benancio Deeds, MD;  Location: [facility] ENDOSCOPY;  Service: Gastroenterology;;   ESOPHAGOGASTRODUODENOSCOPY (EGD) WITH PROPOFOL N/A 05/03/2022   Procedure: ESOPHAGOGASTRODUODENOSCOPY (EGD) WITH PROPOFOL;  Surgeon: Benancio Deeds, MD;  Location: [facility] ENDOSCOPY;  Service: Gastroenterology;  Laterality: N/A;   HEMOSTASIS CONTROL  05/03/2022   Procedure: HEMOSTASIS CONTROL;  Surgeon: Benancio Deeds, MD;  Location: [facility] ENDOSCOPY;  Service: Gastroenterology;;   IR INJECT/THERA/INC NEEDLE/CATH/PLC EPI/LUMB/SAC [facility]  04/29/2021   MOHS SURGERY      Allergies  Allergen Reactions   Morphine     Other reaction(s): Other (See Comments) Apnea with large dose Respiratory Distress    Buprenorphine Hcl     Other reaction(s): Other (See Comments), Other (See Comments) Respiratory Distress Respiratory Distress    Codeine    Epinephrine     Other reaction(s): Other (See Comments) Severe anxiety; "makes my heart race" - would like to avoid during surgery   Morphine And Codeine Other (See Comments)    Respiratory Distress    Percocet [Oxycodone-Acetaminophen]     Outpatient Encounter Medications as of 04/11/2023  Medication Sig  acetaminophen (TYLENOL) 500 MG tablet Take 1,000 mg by mouth every 8 (eight) hours as needed.   amLODipine (NORVASC) 5 MG tablet Take 1 tablet (5 mg total) by mouth daily.   aspirin EC 81 MG tablet Take 1 tablet (81 mg total) by mouth daily. Swallow whole.   atorvastatin (LIPITOR) 40 MG tablet Take 1 tablet (40 mg  total) by mouth daily.   cholecalciferol (VITAMIN D) 25 MCG (1000 UNIT) tablet Take 2,000 Units by mouth daily.   Cyanocobalamin (B-12) 1000 MCG CAPS Take 2,000 mcg by mouth daily.   fluticasone (FLONASE) 50 MCG/ACT nasal spray Place 2 sprays into both nostrils daily.   labetalol (NORMODYNE) 200 MG tablet Take 200 mg by mouth 2 (two) times daily.   lactose free nutrition (BOOST) LIQD Take 237 mLs by mouth 3 (three) times daily between meals.   pantoprazole (PROTONIX) 40 MG tablet Take 40 mg by mouth daily.   senna (SENOKOT) 8.6 MG TABS tablet Take 2 tablets by mouth at bedtime.   sertraline (ZOLOFT) 100 MG tablet Take 1 tablet (100 mg total) by mouth daily.   tamsulosin (FLOMAX) 0.4 MG CAPS capsule Take 0.4 mg by mouth daily after supper.   Zinc Oxide (TRIPLE PASTE) 12.8 % ointment Apply 1 Application topically as needed for irritation.   No facility-administered encounter medications on file as of 04/11/2023.    Review of Systems  Immunization History  Administered Date(s) Administered   Influenza Inj Mdck Quad Pf 11/30/2021   Influenza, High Dose Seasonal PF 12/16/2020   Influenza-Unspecified 12/06/2011, 11/27/2012, 01/19/2014, 12/11/2014, 12/03/2015, 12/10/2016, 12/18/2019, 12/22/2022   PFIZER Comirnaty(Gray Top)Covid-19 Tri-Sucrose Vaccine 03/13/2019, 04/03/2019, 12/05/2019   Pneumococcal Conjugate-13 12/31/2016, 01/05/2017   Pneumococcal Polysaccharide-23 01/05/2018   Td (Adult),5 Lf Tetanus Toxid, Preservative Free 07/16/2019   Tdap 06/18/2011   Unspecified SARS-COV-2 Vaccination 03/12/2019, 04/02/2019, 12/05/2019   Zoster Recombinant(Shingrix) 06/30/2016, 10/26/2017, 01/06/2018   Pertinent  Health Maintenance Due  Topic Date Due   INFLUENZA VACCINE  Completed   DEXA SCAN  Completed      01/09/2022    9:00 AM 01/09/2022    8:25 PM 01/10/2022    8:50 AM 08/16/2022   10:39 AM 03/01/2023   11:44 AM  Fall Risk  Falls in the past year?    1 1  Was there an injury with Fall?     1 1  Fall Risk Category Calculator    3 3  (RETIRED) Patient Fall Risk Level High fall risk High fall risk High fall risk    Patient at Risk for Falls Due to    History of fall(s);Impaired balance/gait;Impaired mobility History of fall(s);Impaired balance/gait;Impaired mobility  Fall risk Follow up    Falls evaluation completed Education provided;Falls prevention discussed   Functional Status Survey:    Vitals:   04/11/23 1124  BP: 110/63  Pulse: 96  Resp: 20  Temp: 97.9 F (36.6 C)  SpO2: 92%   There is no height or weight on file to calculate BMI. Physical Exam  Labs reviewed: Recent Labs    04/26/22 0320 05/01/22 1251 05/07/22 1052 05/08/22 0425 05/10/22 0657 06/08/22 0140 06/11/22 0949 06/21/22 0000 06/28/22 0000 04/10/23 1732  NA 133*   < > 134* 135   < > 137 137 137 139 137  K 3.5   < > 3.5 3.8   < > 4.0 3.3* 4.0 4.2 4.0  CL 97*   < > 100 103   < > 103 105 100 102 99  CO2 22   < >  23 23   < > 23 21* 26* 27* 25  GLUCOSE 131*   < > 142* 100*   < > 135* 141*  --   --  100*  BUN 19   < > 13 10   < > 18 17 21 21  28*  CREATININE 0.64   < > 0.72 0.54   < > 0.62 0.52 0.7 0.6 0.70  CALCIUM 8.9   < > 8.5* 8.5*   < > 9.5 9.2 9.7 9.5 9.5  MG 1.4*   < > 1.9 1.8  --   --  1.2*  --   --   --   PHOS 3.0  --   --   --   --   --   --   --   --   --    < > = values in this interval not displayed.   Recent Labs    04/25/22 0948 05/15/22 1404 06/21/22 0000 04/10/23 1732  AST 26 23 13 28   ALT 23 15 12 19   ALKPHOS 43 62 63 57  BILITOT 0.9 0.5  --  0.6  PROT 7.0 6.5  --  6.5  ALBUMIN 4.1 3.0* 3.4* 3.7   Recent Labs    06/09/22 0443 06/10/22 0440 06/11/22 0949 06/21/22 0000 06/28/22 0000 04/10/23 1732  WBC 7.1  --  8.1 9.2 8.4 9.1  NEUTROABS  --   --   --  6,348.00 4,897.00 5.6  HGB 9.5*   < > 10.4* 10.4* 10.1* 11.9*  HCT 29.8*   < > 32.3* 32* 32* 36.7  MCV 90.3  --  87.8  --   --  81.6  PLT 314  --  322 357 405* 280   < > = values in this interval not  displayed.   No results found for: "TSH" Lab Results  Component Value Date   HGBA1C 5.6 04/26/2022   Lab Results  Component Value Date   CHOL 233 (H) 04/26/2022   HDL 53 04/26/2022   LDLCALC 122 (H) 04/26/2022   LDLDIRECT 114 (H) 04/26/2022   TRIG 292 (H) 04/26/2022   CHOLHDL 4.4 04/26/2022    Significant Diagnostic Results in last 30 days:  DG Hand Complete Left Result Date: 04/10/2023 CLINICAL DATA:  Larey Seat, pain EXAM: LEFT HAND - COMPLETE 3+ VIEW COMPARISON:  None Available. FINDINGS: Frontal, oblique, and lateral views of the left hand are obtained. No acute displaced fracture. There is severe multifocal osteoarthritis, with joint space narrowing and osteophyte formation greatest at the first carpometacarpal joint and throughout the interphalangeal joints. Soft tissues are unremarkable. IMPRESSION: 1. No acute displaced fracture. 2. Severe multifocal osteoarthritis. Electronically Signed   By: Sharlet Salina M.D.   On: 04/10/2023 18:40   CT Head Wo Contrast Result Date: 04/10/2023 CLINICAL DATA:  Head trauma, minor (Age >= 65y) FAll; Neck trauma (Age >= 65y) EXAM: CT HEAD WITHOUT CONTRAST CT CERVICAL SPINE WITHOUT CONTRAST TECHNIQUE: Multidetector CT imaging of the head and cervical spine was performed following the standard protocol without intravenous contrast. Multiplanar CT image reconstructions of the cervical spine were also generated. RADIATION DOSE REDUCTION: This exam was performed according to the departmental dose-optimization program which includes automated exposure control, adjustment of the mA and/or kV according to patient size and/or use of iterative reconstruction technique. COMPARISON:  April 9, 24 CT head and CT cervical spine. FINDINGS: CT HEAD FINDINGS Brain: No evidence of acute infarction, hemorrhage, hydrocephalus, extra-axial collection or mass lesion/mass effect. Remote left cerebellar  infarct. Patchy white matter hypodensities, compatible chronic microvascular  ischemic disease. Remote left basal ganglia lacunar infarcts. Remote inferior right frontal lobe encephalomalacia. Vascular: Calcific atherosclerosis. Skull: No acute fracture. Sinuses/Orbits: Clear sinuses.  No acute orbital findings. Other: No mastoid effusions. CT CERVICAL SPINE FINDINGS Alignment: Similar alignment including approximately 3 mm of anterolisthesis of C4 on C5. Similar lesser grade 1 anterolisthesis of C2 on C3 and C3 on C4. Skull base and vertebrae: Redemonstrated type 2 dens fracture and bilateral posterior arch of C1 fractures. Soft tissues and spinal canal: No prevertebral fluid or swelling. No visible canal hematoma. Disc levels: Moderate to severe multilevel degenerative change, similar. This includes degenerative disc disease with endplate height loss, endplate sclerosis and endplate spurring. Facet and uncovertebral hypertrophy with varying degrees of neural foraminal stenosis. Upper chest: Visualized lung apices are clear. IMPRESSION: CT head: 1. No evidence of acute abnormality intracranially. 2. Remote infarcts and chronic microvascular ischemic disease. CT cervical spine: 1. No evidence of acute fracture or traumatic malalignment. 2. Redemonstrated type 2 dens fracture and bilateral posterior arch of C1 fractures. Unchanged alignment. Electronically Signed   By: Feliberto Harts M.D.   On: 04/10/2023 18:26   CT Cervical Spine Wo Contrast Result Date: 04/10/2023 CLINICAL DATA:  Head trauma, minor (Age >= 65y) FAll; Neck trauma (Age >= 65y) EXAM: CT HEAD WITHOUT CONTRAST CT CERVICAL SPINE WITHOUT CONTRAST TECHNIQUE: Multidetector CT imaging of the head and cervical spine was performed following the standard protocol without intravenous contrast. Multiplanar CT image reconstructions of the cervical spine were also generated. RADIATION DOSE REDUCTION: This exam was performed according to the departmental dose-optimization program which includes automated exposure control, adjustment of  the mA and/or kV according to patient size and/or use of iterative reconstruction technique. COMPARISON:  April 9, 24 CT head and CT cervical spine. FINDINGS: CT HEAD FINDINGS Brain: No evidence of acute infarction, hemorrhage, hydrocephalus, extra-axial collection or mass lesion/mass effect. Remote left cerebellar infarct. Patchy white matter hypodensities, compatible chronic microvascular ischemic disease. Remote left basal ganglia lacunar infarcts. Remote inferior right frontal lobe encephalomalacia. Vascular: Calcific atherosclerosis. Skull: No acute fracture. Sinuses/Orbits: Clear sinuses.  No acute orbital findings. Other: No mastoid effusions. CT CERVICAL SPINE FINDINGS Alignment: Similar alignment including approximately 3 mm of anterolisthesis of C4 on C5. Similar lesser grade 1 anterolisthesis of C2 on C3 and C3 on C4. Skull base and vertebrae: Redemonstrated type 2 dens fracture and bilateral posterior arch of C1 fractures. Soft tissues and spinal canal: No prevertebral fluid or swelling. No visible canal hematoma. Disc levels: Moderate to severe multilevel degenerative change, similar. This includes degenerative disc disease with endplate height loss, endplate sclerosis and endplate spurring. Facet and uncovertebral hypertrophy with varying degrees of neural foraminal stenosis. Upper chest: Visualized lung apices are clear. IMPRESSION: CT head: 1. No evidence of acute abnormality intracranially. 2. Remote infarcts and chronic microvascular ischemic disease. CT cervical spine: 1. No evidence of acute fracture or traumatic malalignment. 2. Redemonstrated type 2 dens fracture and bilateral posterior arch of C1 fractures. Unchanged alignment. Electronically Signed   By: Feliberto Harts M.D.   On: 04/10/2023 18:26    Assessment/Plan Fall with facial laceration and hand injury Sustained a fall while attempting to go to the bathroom, resulting in a facial laceration requiring three stitches and a  swollen, bruised left hand. The fall was due to lack of support and not using the call button for assistance. No other pain or discomfort reported. - Educate on using the call button  for assistance to prevent future falls - Monitor healing of the facial laceration and remove stitches in one week - Evaluate the left hand for further injury if pain persists  Dementia Due to underlying dementia, patient forgets to ask for help and is nonadherent to treatment plan.  - continue supportive care.   Cervical spine fracture Chronic cervical spine fracture, non-healing but currently asymptomatic. Surgeons recommend wearing a neck brace to prevent further injury and ensure stability. - Encourage use of a neck brace as recommended by surgeons - Monitor for new symptoms or changes in condition  General Health Maintenance Residing in the facility for two years with generally good care. Reports good bowel movements, adequate fluid intake, and no other pain or discomfort. - Ensure regular monitoring of overall health and well-being - Provide assistance with toenail clipping.   Family/ staff Communication: nursing  Labs/tests ordered: none

## 2023-04-13 ENCOUNTER — Encounter: Payer: Self-pay | Admitting: Student

## 2023-04-13 NOTE — Telephone Encounter (Signed)
Patient is at [facility]. The nurse confirmed appt for 2/27 with Drake Leach at 1pm and xrays at 12pm [facility].

## 2023-04-20 NOTE — Progress Notes (Deleted)
 Referring Physician:  No referring provider defined for this encounter.  Primary Physician:  Sherry Mealing, MD  History of Present Illness: 04/20/2023*** [name] has a history of HTN, CVA, CHF, upper GI bleed, peptic ulcer, neuropathy, hyperlipidemia.   Seen in ED On 04/10/23 after an unwitnessed fall at [facility]. CT of neck showed chronic unhealed fracture of C1 and C2 (seen on imaging from 2021). Dr. Madaline Brilliant was consulted and he recommended patient be placed in neck brace. Felt fracture was stable, but not healed.   She is here for follow up.     chronic unhealed fracture of C1 and C2 since 2021 Wearing cervical collar?       Review of Systems:  A 10 point review of systems is negative, except for the pertinent positives and negatives detailed in the HPI.  Past Medical History: Past Medical History:  Diagnosis Date   Actinic keratosis    Breast cancer (HCC)    C1 cervical fracture (HCC)    Hypertension    Osteoarthritis     Past Surgical History: Past Surgical History:  Procedure Laterality Date   BACK SURGERY     BIOPSY  05/03/2022   Procedure: BIOPSY;  Surgeon: Benancio Deeds, MD;  Location: [facility] ENDOSCOPY;  Service: Gastroenterology;;   ESOPHAGOGASTRODUODENOSCOPY (EGD) WITH PROPOFOL N/A 05/03/2022   Procedure: ESOPHAGOGASTRODUODENOSCOPY (EGD) WITH PROPOFOL;  Surgeon: Benancio Deeds, MD;  Location: [facility] ENDOSCOPY;  Service: Gastroenterology;  Laterality: N/A;   HEMOSTASIS CONTROL  05/03/2022   Procedure: HEMOSTASIS CONTROL;  Surgeon: Benancio Deeds, MD;  Location: [facility] ENDOSCOPY;  Service: Gastroenterology;;   IR INJECT/THERA/INC NEEDLE/CATH/PLC EPI/LUMB/SAC [facility]  04/29/2021   MOHS SURGERY      Allergies: Allergies as of 04/28/2023 - Review Complete 04/13/2023  Allergen Reaction Noted   Morphine  12/15/2014   Buprenorphine hcl  06/02/2015   Codeine  05/11/2019   Epinephrine  05/28/2019   Morphine and codeine Other (See Comments) 12/15/2014    Percocet [oxycodone-acetaminophen]  05/11/2019    Medications: Outpatient Encounter Medications as of 04/28/2023  Medication Sig   acetaminophen (TYLENOL) 500 MG tablet Take 1,000 mg by mouth every 8 (eight) hours as needed.   amLODipine (NORVASC) 5 MG tablet Take 1 tablet (5 mg total) by mouth daily.   aspirin EC 81 MG tablet Take 1 tablet (81 mg total) by mouth daily. Swallow whole.   atorvastatin (LIPITOR) 40 MG tablet Take 1 tablet (40 mg total) by mouth daily.   cholecalciferol (VITAMIN D) 25 MCG (1000 UNIT) tablet Take 2,000 Units by mouth daily.   Cyanocobalamin (B-12) 1000 MCG CAPS Take 2,000 mcg by mouth daily.   fluticasone (FLONASE) 50 MCG/ACT nasal spray Place 2 sprays into both nostrils daily.   labetalol (NORMODYNE) 200 MG tablet Take 200 mg by mouth 2 (two) times daily.   lactose free nutrition (BOOST) LIQD Take 237 mLs by mouth 3 (three) times daily between meals.   pantoprazole (PROTONIX) 40 MG tablet Take 40 mg by mouth daily.   senna (SENOKOT) 8.6 MG TABS tablet Take 2 tablets by mouth at bedtime.   sertraline (ZOLOFT) 100 MG tablet Take 1 tablet (100 mg total) by mouth daily.   tamsulosin (FLOMAX) 0.4 MG CAPS capsule Take 0.4 mg by mouth daily after supper.   Zinc Oxide (TRIPLE PASTE) 12.8 % ointment Apply 1 Application topically as needed for irritation.   No facility-administered encounter medications on file as of 04/28/2023.    Social History: Social History   Tobacco  Use   Smoking status: Never    Passive exposure: Never   Smokeless tobacco: Never  Vaping Use   Vaping status: Never Used  Substance Use Topics   Alcohol use: Yes    Comment: Occassionally   Drug use: No    Family Medical History: Family History  Problem Relation Age of Onset   Hypertension Father     Physical Examination: There were no vitals filed for this visit.    Awake, alert, oriented to person, place, and time.  Speech is clear and fluent. Fund of knowledge is appropriate.  ***  Cranial Nerves: Pupils equal round and reactive to light.  Facial tone is symmetric.    *** ROM of cervical spine *** pain *** posterior cervical tenderness. *** tenderness in bilateral trapezial region.   *** ROM of lumbar spine *** pain *** posterior lumbar tenderness.   No abnormal lesions on exposed skin.   Strength: Side Biceps Triceps Deltoid Interossei Grip Wrist Ext. Wrist Flex.  R 5 5 5 5 5 5 5   L 5 5 5 5 5 5 5    Side Iliopsoas Quads Hamstring PF DF EHL  R 5 5 5 5 5 5   L 5 5 5 5 5 5    Reflexes are ***2+ and symmetric at the biceps, brachioradialis, patella and achilles.   Hoffman's is absent.  Clonus is not present.   Bilateral upper and lower extremity sensation is intact to light touch.     Gait is normal.   ***No difficulty with tandem gait.    Medical Decision Making  Imaging: Cervical xrays dated ***:    Report not yet available for above xrays.    CT of cervical spine dated 04/10/23:  Alignment: Similar alignment including approximately 3 mm of anterolisthesis of C4 on C5. Similar lesser grade 1 anterolisthesis of C2 on C3 and C3 on C4.   Skull base and vertebrae: Redemonstrated type 2 dens fracture and bilateral posterior arch of C1 fractures.   Soft tissues and spinal canal: No prevertebral fluid or swelling. No visible canal hematoma.   Disc levels: Moderate to severe multilevel degenerative change, similar. This includes degenerative disc disease with endplate height loss, endplate sclerosis and endplate spurring. Facet and uncovertebral hypertrophy with varying degrees of neural foraminal stenosis.   Upper chest: Visualized lung apices are clear.   IMPRESSION: 1. No evidence of acute fracture or traumatic malalignment. 2. Redemonstrated type 2 dens fracture and bilateral posterior arch of C1 fractures. Unchanged alignment.     Electronically Signed   By: Feliberto Harts M.D.   On: 04/10/2023 18:26        I have personally  reviewed the images and agree with the above interpretation.  Assessment and Plan: Sherry [name] is a pleasant 89 y.o. female has ***  Treatment options discussed with patient and following plan made:   - Order for physical therapy for *** spine ***. Patient to call to schedule appointment. *** - Continue current medications including ***. Reviewed dosing and side effects.  - Prescription for ***. Reviewed dosing and side effects. Take with food.  - Prescription for *** to take prn muscle spasms. Reviewed dosing and side effects. Discussed this can cause drowsiness.  - MRI of *** to further evaluate *** radiculopathy. No improvement time or medications (***).  - Referral to PMR at [facility] to discuss possible *** injections.  - Will schedule phone visit to review MRI results once I get them back.   I spent a  total of *** minutes in face-to-face and non-face-to-face activities related to this patient's care today including review of outside records, review of imaging, review of symptoms, physical exam, discussion of differential diagnosis, discussion of treatment options, and documentation.   Thank you for involving me in the care of this patient.   Drake Leach PA-C Dept. of Neurosurgery

## 2023-04-23 ENCOUNTER — Other Ambulatory Visit: Payer: Self-pay | Admitting: Orthopedic Surgery

## 2023-04-23 DIAGNOSIS — S12001G Unspecified nondisplaced fracture of first cervical vertebra, subsequent encounter for fracture with delayed healing: Secondary | ICD-10-CM

## 2023-04-28 ENCOUNTER — Ambulatory Visit: Payer: Medicare Other | Admitting: Orthopedic Surgery

## 2023-05-06 ENCOUNTER — Encounter: Payer: Self-pay | Admitting: Student

## 2023-05-06 ENCOUNTER — Non-Acute Institutional Stay (SKILLED_NURSING_FACILITY): Payer: Self-pay | Admitting: Student

## 2023-05-06 DIAGNOSIS — F331 Major depressive disorder, recurrent, moderate: Secondary | ICD-10-CM | POA: Diagnosis not present

## 2023-05-06 DIAGNOSIS — K529 Noninfective gastroenteritis and colitis, unspecified: Secondary | ICD-10-CM

## 2023-05-06 DIAGNOSIS — S065XAA Traumatic subdural hemorrhage with loss of consciousness status unknown, initial encounter: Secondary | ICD-10-CM

## 2023-05-06 DIAGNOSIS — I10 Essential (primary) hypertension: Secondary | ICD-10-CM

## 2023-05-06 DIAGNOSIS — I69319 Unspecified symptoms and signs involving cognitive functions following cerebral infarction: Secondary | ICD-10-CM | POA: Diagnosis not present

## 2023-05-06 DIAGNOSIS — D638 Anemia in other chronic diseases classified elsewhere: Secondary | ICD-10-CM

## 2023-05-06 NOTE — Progress Notes (Unsigned)
 Location:  Other [place].  Nursing Home Room Number: [facility] [address] Place of Service:  SNF [id number]) Provider:  Earnestine Mealing, MD  Patient Care Team: Earnestine Mealing, MD as PCP - General (Family Medicine) Antonieta Iba, MD as Consulting Physician (Cardiology)  Extended Emergency Contact Information Primary Emergency Contact: Renk,james Sr. Home Phone: [phone] Mobile Phone: [phone] Relation: Spouse Secondary Emergency Contact: Asa Lente Home Phone: [phone] Mobile Phone: [phone] Relation: Son  Code Status:  DNR Goals of care: Advanced Directive information    05/06/2023   12:02 PM  Advanced Directives  Does Patient Have a Medical Advance Directive? Yes  Type of Advance Directive Out of facility DNR (pink MOST or yellow form)  Does patient want to make changes to medical advance directive? No - Patient declined     Chief Complaint  Patient presents with  . Medical Management of Chronic Issues    Medical Management of Chronic Issues.     HPI:  Pt is a 89 y.o. female seen today for medical management of chronic diseases.    Patient had 6 episodes of diarrhea this morning. She states she has been fine. She denies memory problems. States her husband doesn't have memory problems (even though nursing concerned he hasn't been able to m   Past Medical History:  Diagnosis Date  . Actinic keratosis   . Breast cancer (HCC)   . C1 cervical fracture (HCC)   . Hypertension   . Osteoarthritis    Past Surgical History:  Procedure Laterality Date  . BACK SURGERY    . BIOPSY  05/03/2022   Procedure: BIOPSY;  Surgeon: Benancio Deeds, MD;  Location: [facility] ENDOSCOPY;  Service: Gastroenterology;;  . ESOPHAGOGASTRODUODENOSCOPY (EGD) WITH PROPOFOL N/A 05/03/2022   Procedure: ESOPHAGOGASTRODUODENOSCOPY (EGD) WITH PROPOFOL;  Surgeon: Benancio Deeds, MD;  Location: [facility] ENDOSCOPY;  Service: Gastroenterology;  Laterality: N/A;  . HEMOSTASIS CONTROL   05/03/2022   Procedure: HEMOSTASIS CONTROL;  Surgeon: Benancio Deeds, MD;  Location: [facility] ENDOSCOPY;  Service: Gastroenterology;;  . IR INJECT/THERA/INC NEEDLE/CATH/PLC EPI/LUMB/SAC Glennon Mac  04/29/2021  . MOHS SURGERY      Allergies  Allergen Reactions  . Morphine     Other reaction(s): Other (See Comments) Apnea with large dose Respiratory Distress   . Buprenorphine Hcl     Other reaction(s): Other (See Comments), Other (See Comments) Respiratory Distress Respiratory Distress   . Codeine   . Epinephrine     Other reaction(s): Other (See Comments) Severe anxiety; "makes my heart race" - would like to avoid during surgery  . Morphine And Codeine Other (See Comments)    Respiratory Distress   . Percocet [Oxycodone-Acetaminophen]     Outpatient Encounter Medications as of 05/06/2023  Medication Sig  . acetaminophen (TYLENOL) 500 MG tablet Take 1,000 mg by mouth every 8 (eight) hours as needed.  Marland Kitchen amLODipine (NORVASC) 5 MG tablet Take 1 tablet (5 mg total) by mouth daily.  Marland Kitchen aspirin EC 81 MG tablet Take 1 tablet (81 mg total) by mouth daily. Swallow whole.  Marland Kitchen atorvastatin (LIPITOR) 40 MG tablet Take 1 tablet (40 mg total) by mouth daily.  . cholecalciferol (VITAMIN D) 25 MCG (1000 UNIT) tablet Take 2,000 Units by mouth daily.  . Cyanocobalamin (B-12) 1000 MCG CAPS Take 2,000 mcg by mouth daily.  . fluticasone (FLONASE) 50 MCG/ACT nasal spray Place 2 sprays into both nostrils daily.  Marland Kitchen labetalol (NORMODYNE) 200 MG tablet Take 200 mg by mouth 2 (two) times daily.  Marland Kitchen lactose free  nutrition (BOOST) LIQD Take 237 mLs by mouth 3 (three) times daily between meals.  . pantoprazole (PROTONIX) 40 MG tablet Take 40 mg by mouth daily.  Marland Kitchen senna (SENOKOT) 8.6 MG TABS tablet Take 2 tablets by mouth at bedtime.  . sertraline (ZOLOFT) 100 MG tablet Take 1 tablet (100 mg total) by mouth daily.  . tamsulosin (FLOMAX) 0.4 MG CAPS capsule Take 0.4 mg by mouth daily after supper.  . Zinc Oxide (TRIPLE  PASTE) 12.8 % ointment Apply 1 Application topically as needed for irritation.   No facility-administered encounter medications on file as of 05/06/2023.    Review of Systems  Immunization History  Administered Date(s) Administered  . Influenza Inj Mdck Quad Pf 11/30/2021  . Influenza, High Dose Seasonal PF 12/16/2020  . Influenza-Unspecified 12/06/2011, 11/27/2012, 01/19/2014, 12/11/2014, 12/03/2015, 12/10/2016, 12/18/2019, 12/22/2022  . PFIZER Comirnaty(Gray Top)Covid-19 Tri-Sucrose Vaccine 03/13/2019, 04/03/2019, 12/05/2019  . Pneumococcal Conjugate-13 12/31/2016, 01/05/2017  . Pneumococcal Polysaccharide-23 01/05/2018  . Td (Adult),5 Lf Tetanus Toxid, Preservative Free 07/16/2019  . Tdap 06/18/2011  . Unspecified SARS-COV-2 Vaccination 03/12/2019, 04/02/2019, 12/05/2019  . Zoster Recombinant(Shingrix) 06/30/2016, 10/26/2017, 01/06/2018   Pertinent  Health Maintenance Due  Topic Date Due  . INFLUENZA VACCINE  Completed  . DEXA SCAN  Completed      01/09/2022    9:00 AM 01/09/2022    8:25 PM 01/10/2022    8:50 AM 08/16/2022   10:39 AM 03/01/2023   11:44 AM  Fall Risk  Falls in the past year?    1 1  Was there an injury with Fall?    1 1  Fall Risk Category Calculator    3 3  (RETIRED) Patient Fall Risk Level High fall risk High fall risk High fall risk    Patient at Risk for Falls Due to    History of fall(s);Impaired balance/gait;Impaired mobility History of fall(s);Impaired balance/gait;Impaired mobility  Fall risk Follow up    Falls evaluation completed Education provided;Falls prevention discussed   Functional Status Survey:    Vitals:   05/06/23 1151 05/06/23 1203  BP: (!) 156/70 (!) 149/78  Pulse: 80   Resp: 20   Temp: 98 F (36.7 C)   SpO2: 97%   Weight: 128 lb 11.2 oz (58.4 kg)   Height: 5\' 2"  (1.575 m)    Body mass index is 23.54 kg/m. Physical Exam  Labs reviewed: Recent Labs    05/07/22 1052 05/08/22 0425 05/10/22 0657 06/08/22 0140  06/11/22 0949 06/21/22 0000 06/28/22 0000 03/21/23 0000 04/10/23 1732  NA 134* 135   < > 137 137   < > 139 142 137  K 3.5 3.8   < > 4.0 3.3*   < > 4.2 4.4 4.0  CL 100 103   < > 103 105   < > 102 103 99  CO2 23 23   < > 23 21*   < > 27* 31* 25  GLUCOSE 142* 100*   < > 135* 141*  --   --   --  100*  BUN 13 10   < > 18 17   < > 21 19 28*  CREATININE 0.72 0.54   < > 0.62 0.52   < > 0.6 0.7 0.70  CALCIUM 8.5* 8.5*   < > 9.5 9.2   < > 9.5 9.4 9.5  MG 1.9 1.8  --   --  1.2*  --   --   --   --    < > = values in  this interval not displayed.   Recent Labs    05/15/22 1404 06/21/22 0000 03/21/23 0000 04/10/23 1732  AST 23 13 16 28   ALT 15 12 12 19   ALKPHOS 62 63 53 57  BILITOT 0.5  --   --  0.6  PROT 6.5  --   --  6.5  ALBUMIN 3.0* 3.4* 3.7 3.7   Recent Labs    06/09/22 0443 06/10/22 0440 06/11/22 0949 06/21/22 0000 06/28/22 0000 03/21/23 0000 04/10/23 1732  WBC 7.1  --  8.1   < > 8.4 6.8 9.1  NEUTROABS  --   --   --    < > 4,897.00 3,454.00 5.6  HGB 9.5*   < > 10.4*   < > 10.1* 11.1* 11.9*  HCT 29.8*   < > 32.3*   < > 32* 35* 36.7  MCV 90.3  --  87.8  --   --   --  81.6  PLT 314  --  322   < > 405* 240 280   < > = values in this interval not displayed.   No results found for: "TSH" Lab Results  Component Value Date   HGBA1C 5.6 04/26/2022   Lab Results  Component Value Date   CHOL 123 03/21/2023   HDL 45 03/21/2023   LDLCALC 53 03/21/2023   LDLDIRECT 114 (H) 04/26/2022   TRIG 176 (A) 03/21/2023   CHOLHDL 4.4 04/26/2022    Significant Diagnostic Results in last 30 days:  DG Hand Complete Left Result Date: 04/10/2023 CLINICAL DATA:  Larey Seat, pain EXAM: LEFT HAND - COMPLETE 3+ VIEW COMPARISON:  None Available. FINDINGS: Frontal, oblique, and lateral views of the left hand are obtained. No acute displaced fracture. There is severe multifocal osteoarthritis, with joint space narrowing and osteophyte formation greatest at the first carpometacarpal joint and throughout the  interphalangeal joints. Soft tissues are unremarkable. IMPRESSION: 1. No acute displaced fracture. 2. Severe multifocal osteoarthritis. Electronically Signed   By: Sharlet Salina M.D.   On: 04/10/2023 18:40   CT Head Wo Contrast Result Date: 04/10/2023 CLINICAL DATA:  Head trauma, minor (Age >= 65y) FAll; Neck trauma (Age >= 65y) EXAM: CT HEAD WITHOUT CONTRAST CT CERVICAL SPINE WITHOUT CONTRAST TECHNIQUE: Multidetector CT imaging of the head and cervical spine was performed following the standard protocol without intravenous contrast. Multiplanar CT image reconstructions of the cervical spine were also generated. RADIATION DOSE REDUCTION: This exam was performed according to the departmental dose-optimization program which includes automated exposure control, adjustment of the mA and/or kV according to patient size and/or use of iterative reconstruction technique. COMPARISON:  April 9, 24 CT head and CT cervical spine. FINDINGS: CT HEAD FINDINGS Brain: No evidence of acute infarction, hemorrhage, hydrocephalus, extra-axial collection or mass lesion/mass effect. Remote left cerebellar infarct. Patchy white matter hypodensities, compatible chronic microvascular ischemic disease. Remote left basal ganglia lacunar infarcts. Remote inferior right frontal lobe encephalomalacia. Vascular: Calcific atherosclerosis. Skull: No acute fracture. Sinuses/Orbits: Clear sinuses.  No acute orbital findings. Other: No mastoid effusions. CT CERVICAL SPINE FINDINGS Alignment: Similar alignment including approximately 3 mm of anterolisthesis of C4 on C5. Similar lesser grade 1 anterolisthesis of C2 on C3 and C3 on C4. Skull base and vertebrae: Redemonstrated type 2 dens fracture and bilateral posterior arch of C1 fractures. Soft tissues and spinal canal: No prevertebral fluid or swelling. No visible canal hematoma. Disc levels: Moderate to severe multilevel degenerative change, similar. This includes degenerative disc disease with  endplate height loss, endplate sclerosis  and endplate spurring. Facet and uncovertebral hypertrophy with varying degrees of neural foraminal stenosis. Upper chest: Visualized lung apices are clear. IMPRESSION: CT head: 1. No evidence of acute abnormality intracranially. 2. Remote infarcts and chronic microvascular ischemic disease. CT cervical spine: 1. No evidence of acute fracture or traumatic malalignment. 2. Redemonstrated type 2 dens fracture and bilateral posterior arch of C1 fractures. Unchanged alignment. Electronically Signed   By: Feliberto Harts M.D.   On: 04/10/2023 18:26   CT Cervical Spine Wo Contrast Result Date: 04/10/2023 CLINICAL DATA:  Head trauma, minor (Age >= 65y) FAll; Neck trauma (Age >= 65y) EXAM: CT HEAD WITHOUT CONTRAST CT CERVICAL SPINE WITHOUT CONTRAST TECHNIQUE: Multidetector CT imaging of the head and cervical spine was performed following the standard protocol without intravenous contrast. Multiplanar CT image reconstructions of the cervical spine were also generated. RADIATION DOSE REDUCTION: This exam was performed according to the departmental dose-optimization program which includes automated exposure control, adjustment of the mA and/or kV according to patient size and/or use of iterative reconstruction technique. COMPARISON:  April 9, 24 CT head and CT cervical spine. FINDINGS: CT HEAD FINDINGS Brain: No evidence of acute infarction, hemorrhage, hydrocephalus, extra-axial collection or mass lesion/mass effect. Remote left cerebellar infarct. Patchy white matter hypodensities, compatible chronic microvascular ischemic disease. Remote left basal ganglia lacunar infarcts. Remote inferior right frontal lobe encephalomalacia. Vascular: Calcific atherosclerosis. Skull: No acute fracture. Sinuses/Orbits: Clear sinuses.  No acute orbital findings. Other: No mastoid effusions. CT CERVICAL SPINE FINDINGS Alignment: Similar alignment including approximately 3 mm of anterolisthesis of  C4 on C5. Similar lesser grade 1 anterolisthesis of C2 on C3 and C3 on C4. Skull base and vertebrae: Redemonstrated type 2 dens fracture and bilateral posterior arch of C1 fractures. Soft tissues and spinal canal: No prevertebral fluid or swelling. No visible canal hematoma. Disc levels: Moderate to severe multilevel degenerative change, similar. This includes degenerative disc disease with endplate height loss, endplate sclerosis and endplate spurring. Facet and uncovertebral hypertrophy with varying degrees of neural foraminal stenosis. Upper chest: Visualized lung apices are clear. IMPRESSION: CT head: 1. No evidence of acute abnormality intracranially. 2. Remote infarcts and chronic microvascular ischemic disease. CT cervical spine: 1. No evidence of acute fracture or traumatic malalignment. 2. Redemonstrated type 2 dens fracture and bilateral posterior arch of C1 fractures. Unchanged alignment. Electronically Signed   By: Feliberto Harts M.D.   On: 04/10/2023 18:26    Assessment/Plan There are no diagnoses linked to this encounter.   Family/ staff Communication: ***  Labs/tests ordered:  ***

## 2023-05-08 ENCOUNTER — Encounter: Payer: Self-pay | Admitting: Student

## 2023-05-24 ENCOUNTER — Ambulatory Visit: Payer: Self-pay

## 2023-05-24 NOTE — Telephone Encounter (Signed)
  Chief Complaint: near fall Symptoms: near fall, bruise on arm and forehead Frequency: today Pertinent Negatives: Patient denies all other symptoms Disposition: [] ED /[] Urgent Care (no appt availability in office) / [] Appointment(In office/virtual)/ []  [facility]/ [] Home Care/ [] Refused Recommended Disposition /[]  Mobile Bus/ [x]  Follow-up with PCP Additional Notes:  Judeth Cornfield from [facility] calling to report accident with injury. While walking to the bathroom Sherry [name] lost her balance and hit her side and head on the bathroom door, she did not fall to the floor, she regained her balance. Noted bruise on her forearm, and forehead. Initiated neuro checks due to bruising on head. No visit scheduled at this time.   Copied from CRM [id number]. Topic: Clinical - Red Word Triage >> May 24, 2023  5:50 PM Antony Haste wrote: Red Word that prompted transfer to Nurse Triage: Judeth Cornfield with [facility] states this patient injured herself, she fell and hit her head on the side of the door Reason for Disposition  Weakness is a chronic symptom (recurrent or ongoing AND present > 4 weeks)  Protocols used: Falls and [facility]

## 2023-05-25 NOTE — Telephone Encounter (Signed)
 Colletta Maryland, RN  [facility]13 hours ago (6:00 PM)    FYI-declined need for acute visit, reporting injury  Message routed to Earnestine Mealing, MD. See Triage Nurse Notes FYI

## 2023-06-09 LAB — HEPATIC FUNCTION PANEL
ALT: 14 U/L (ref 7–35)
AST: 18 (ref 13–35)
Alkaline Phosphatase: 61 (ref 25–125)
Bilirubin, Total: 0.4

## 2023-06-09 LAB — CBC AND DIFFERENTIAL
HCT: 37 (ref 36–46)
Hemoglobin: 11.8 — AB (ref 12.0–16.0)
Neutrophils Absolute: 5048
Platelets: 263 10*3/uL (ref 150–400)
WBC: 8

## 2023-06-09 LAB — COMPREHENSIVE METABOLIC PANEL WITH GFR
Albumin: 3.8 (ref 3.5–5.0)
Globulin: 1.9
eGFR: 81

## 2023-06-09 LAB — BASIC METABOLIC PANEL WITH GFR
BUN: 20 (ref 4–21)
CO2: 27 — AB (ref 13–22)
Chloride: 103 (ref 99–108)
Creatinine: 0.7 (ref 0.5–1.1)
Glucose: 112
Potassium: 4.6 meq/L (ref 3.5–5.1)
Sodium: 140 (ref 137–147)

## 2023-06-09 LAB — CBC: RBC: 4.45 (ref 3.87–5.11)

## 2023-07-14 ENCOUNTER — Non-Acute Institutional Stay (SKILLED_NURSING_FACILITY): Payer: Self-pay | Admitting: Nurse Practitioner

## 2023-07-14 ENCOUNTER — Encounter: Payer: Self-pay | Admitting: Nurse Practitioner

## 2023-07-14 DIAGNOSIS — I10 Essential (primary) hypertension: Secondary | ICD-10-CM | POA: Diagnosis not present

## 2023-07-14 DIAGNOSIS — I69319 Unspecified symptoms and signs involving cognitive functions following cerebral infarction: Secondary | ICD-10-CM | POA: Diagnosis not present

## 2023-07-14 DIAGNOSIS — F331 Major depressive disorder, recurrent, moderate: Secondary | ICD-10-CM | POA: Diagnosis not present

## 2023-07-14 DIAGNOSIS — D638 Anemia in other chronic diseases classified elsewhere: Secondary | ICD-10-CM

## 2023-07-14 DIAGNOSIS — E785 Hyperlipidemia, unspecified: Secondary | ICD-10-CM

## 2023-07-14 DIAGNOSIS — I5032 Chronic diastolic (congestive) heart failure: Secondary | ICD-10-CM

## 2023-07-14 DIAGNOSIS — K219 Gastro-esophageal reflux disease without esophagitis: Secondary | ICD-10-CM

## 2023-07-14 DIAGNOSIS — K5900 Constipation, unspecified: Secondary | ICD-10-CM

## 2023-07-14 NOTE — Assessment & Plan Note (Signed)
 Euvolemic, continue to monitor

## 2023-07-14 NOTE — Assessment & Plan Note (Signed)
 Weight has been stable, continue to monitor.

## 2023-07-14 NOTE — Assessment & Plan Note (Signed)
 Recent hgb stable, no signs of blood loss, continue to monitor

## 2023-07-14 NOTE — Assessment & Plan Note (Signed)
 Stable, continues on statin, ASA with proper bp control.

## 2023-07-14 NOTE — Assessment & Plan Note (Signed)
Stable on lipitor '40mg'$  daily.  ?

## 2023-07-14 NOTE — Assessment & Plan Note (Signed)
 Controlled on current regimen.

## 2023-07-14 NOTE — Assessment & Plan Note (Signed)
 Blood pressure well controlled, goal bp <140/90 Continue current medications and dietary modifications follow metabolic panel

## 2023-07-14 NOTE — Assessment & Plan Note (Signed)
 Stable, controlled on protonix  40 mg daily

## 2023-07-14 NOTE — Progress Notes (Signed)
 Location:  Other [facility].  Nursing Home Room Number: [facility] [address]A Place of Service:  SNF [phone]) Gilbert Lab, NP  PCP: Valrie Gehrig, MD  Patient Care Team: Valrie Gehrig, MD as PCP - General (Family Medicine) Jerelene Monday Deadra Everts, MD as Consulting Physician (Cardiology)  Extended Emergency Contact Information Primary Emergency Contact: Wetherell,james Sr. Home Phone: [phone] Mobile Phone: [phone] Relation: Spouse Secondary Emergency Contact: Suzanna Erp Home Phone: [phone] Mobile Phone: [phone] Relation: Son  Goals of care: Advanced Directive information    07/14/2023    1:32 PM  Advanced Directives  Does Patient Have a Medical Advance Directive? Yes  Type of Advance Directive Out of facility DNR (pink MOST or yellow form)  Does patient want to make changes to medical advance directive? No - Patient declined     Chief Complaint  Patient presents with   Medical Management of Chronic Issues    Medical Management of Chronic Issues.     HPI:  Pt is a 89 y.o. female seen today for medical management of chronic disease. She continues to be pleasantly confused.  Staff has no concerns. She has no complaints today and reports she is doing good.  She denies pain.  Weight has been stable.  No chest pains, shortness of breath, LE Edema.  Denies constipation     Past Medical History:  Diagnosis Date   Actinic keratosis    Breast cancer (HCC)    C1 cervical fracture (HCC)    Hypertension    Osteoarthritis    Past Surgical History:  Procedure Laterality Date   BACK SURGERY     BIOPSY  05/03/2022   Procedure: BIOPSY;  Surgeon: Ace Holder, MD;  Location: [facility] ENDOSCOPY;  Service: Gastroenterology;;   ESOPHAGOGASTRODUODENOSCOPY (EGD) WITH PROPOFOL  N/A 05/03/2022   Procedure: ESOPHAGOGASTRODUODENOSCOPY (EGD) WITH PROPOFOL ;  Surgeon: Ace Holder, MD;  Location: [facility] ENDOSCOPY;  Service: Gastroenterology;  Laterality: N/A;    HEMOSTASIS CONTROL  05/03/2022   Procedure: HEMOSTASIS CONTROL;  Surgeon: Ace Holder, MD;  Location: [facility] ENDOSCOPY;  Service: Gastroenterology;;   IR INJECT/THERA/INC NEEDLE/CATH/PLC EPI/LUMB/SAC [facility]  04/29/2021   MOHS SURGERY      Allergies  Allergen Reactions   Morphine     Other reaction(s): Other (See Comments) Apnea with large dose Respiratory Distress    Buprenorphine Hcl     Other reaction(s): Other (See Comments), Other (See Comments) Respiratory Distress Respiratory Distress    Codeine    Epinephrine      Other reaction(s): Other (See Comments) Severe anxiety; "makes my heart race" - would like to avoid during surgery   Morphine And Codeine Other (See Comments)    Respiratory Distress    Percocet [Oxycodone -Acetaminophen ]     Outpatient Encounter Medications as of 07/14/2023  Medication Sig   acetaminophen  (TYLENOL ) 500 MG tablet Take 1,000 mg by mouth every 8 (eight) hours as needed.   amLODipine  (NORVASC ) 5 MG tablet Take 1 tablet (5 mg total) by mouth daily.   aspirin  EC 81 MG tablet Take 1 tablet (81 mg total) by mouth daily. Swallow whole.   atorvastatin  (LIPITOR) 40 MG tablet Take 1 tablet (40 mg total) by mouth daily.   cholecalciferol  (VITAMIN D ) 25 MCG (1000 UNIT) tablet Take 2,000 Units by mouth daily.   Cyanocobalamin  (B-12) 1000 MCG CAPS Take 2,000 mcg by mouth daily.   fluticasone (FLONASE) 50 MCG/ACT nasal spray Place 2 sprays into both nostrils daily.   labetalol  (NORMODYNE ) 200 MG tablet Take 200 mg by mouth 2 (  two) times daily.   lactose free nutrition (BOOST) LIQD Take 237 mLs by mouth 3 (three) times daily between meals.   loperamide  (IMODIUM  A-D) 2 MG tablet Take 2 mg by mouth every 6 (six) hours as needed for diarrhea or loose stools.   ondansetron  (ZOFRAN ) 8 MG tablet Take 8 mg by mouth every 8 (eight) hours as needed for nausea or vomiting.   pantoprazole  (PROTONIX ) 40 MG tablet Take 40 mg by mouth daily.   senna (SENOKOT) 8.6 MG TABS  tablet Take 2 tablets by mouth at bedtime.   sertraline  (ZOLOFT ) 50 MG tablet Take 75 mg by mouth at bedtime.   tamsulosin  (FLOMAX ) 0.4 MG CAPS capsule Take 0.4 mg by mouth daily after supper.   Zinc  Oxide (TRIPLE PASTE) 12.8 % ointment Apply 1 Application topically as needed for irritation.   [DISCONTINUED] sertraline  (ZOLOFT ) 100 MG tablet Take 1 tablet (100 mg total) by mouth daily.   No facility-administered encounter medications on file as of 07/14/2023.    Review of Systems  Constitutional:  Negative for activity change, appetite change, fatigue and unexpected weight change.  HENT:  Negative for congestion and hearing loss.   Eyes: Negative.   Respiratory:  Negative for cough and shortness of breath.   Cardiovascular:  Negative for chest pain, palpitations and leg swelling.  Gastrointestinal:  Negative for abdominal pain, constipation and diarrhea.  Genitourinary:  Negative for difficulty urinating and dysuria.  Musculoskeletal:  Negative for arthralgias and myalgias.  Skin:  Negative for color change and wound.  Neurological:  Negative for dizziness and weakness.  Psychiatric/Behavioral:  Positive for confusion. Negative for agitation and behavioral problems.      Immunization History  Administered Date(s) Administered   Influenza Inj Mdck Quad Pf 11/30/2021   Influenza, High Dose Seasonal PF 12/16/2020   Influenza-Unspecified 12/06/2011, 11/27/2012, 01/19/2014, 12/11/2014, 12/03/2015, 12/10/2016, 12/18/2019, 12/22/2022   PFIZER Comirnaty(Gray Top)Covid-19 Tri-Sucrose Vaccine 03/13/2019, 04/03/2019, 12/05/2019   Pneumococcal Conjugate-13 12/31/2016, 01/05/2017   Pneumococcal Polysaccharide-23 01/05/2018   Td (Adult),5 Lf Tetanus Toxid, Preservative Free 07/16/2019   Tdap 06/18/2011   Unspecified SARS-COV-2 Vaccination 03/12/2019, 04/02/2019, 12/05/2019   Zoster Recombinant(Shingrix) 06/30/2016, 10/26/2017, 01/06/2018   Pertinent  Health Maintenance Due  Topic Date Due    INFLUENZA VACCINE  09/30/2023   DEXA SCAN  Completed      01/09/2022    9:00 AM 01/09/2022    8:25 PM 01/10/2022    8:50 AM 08/16/2022   10:39 AM 03/01/2023   11:44 AM  Fall Risk  Falls in the past year?    1 1  Was there an injury with Fall?    1 1  Fall Risk Category Calculator    3 3  (RETIRED) Patient Fall Risk Level High fall risk High fall risk High fall risk    Patient at Risk for Falls Due to    History of fall(s);Impaired balance/gait;Impaired mobility History of fall(s);Impaired balance/gait;Impaired mobility  Fall risk Follow up    Falls evaluation completed Education provided;Falls prevention discussed   Functional Status Survey:    Vitals:   07/14/23 1320  BP: 124/71  Pulse: 74  Resp: 17  Temp: 97.7 F (36.5 C)  SpO2: 95%  Weight: 134 lb 9.6 oz (61.1 kg)  Height: 5\' 2"  (1.575 m)   Body mass index is 24.62 kg/m. Physical Exam Constitutional:      General: She is not in acute distress.    Appearance: She is well-developed. She is not diaphoretic.  HENT:  Head: Normocephalic and atraumatic.     Mouth/Throat:     Pharynx: No oropharyngeal exudate.  Eyes:     Conjunctiva/sclera: Conjunctivae normal.     Pupils: Pupils are equal, round, and reactive to light.  Cardiovascular:     Rate and Rhythm: Normal rate and regular rhythm.     Heart sounds: Normal heart sounds.  Pulmonary:     Effort: Pulmonary effort is normal.     Breath sounds: Normal breath sounds.  Abdominal:     General: Bowel sounds are normal.     Palpations: Abdomen is soft.  Musculoskeletal:     Cervical back: Normal range of motion and neck supple.     Right lower leg: No edema.     Left lower leg: No edema.  Skin:    General: Skin is warm and dry.  Neurological:     Mental Status: She is alert.  Psychiatric:        Mood and Affect: Mood normal.     Labs reviewed: Recent Labs    03/21/23 0000 04/10/23 1732 06/09/23 0000  NA 142 137 140  K 4.4 4.0 4.6  CL 103 99 103   CO2 31* 25 27*  GLUCOSE  --  100*  --   BUN 19 28* 20  CREATININE 0.7 0.70 0.7  CALCIUM  9.4 9.5  --    Recent Labs    03/21/23 0000 04/10/23 1732 06/09/23 0000  AST 16 28 18   ALT 12 19 14   ALKPHOS 53 57 61  BILITOT  --  0.6  --   PROT  --  6.5  --   ALBUMIN 3.7 3.7 3.8   Recent Labs    03/21/23 0000 04/10/23 1732 06/09/23 0000  WBC 6.8 9.1 8.0  NEUTROABS 3,454.00 5.6 5,048.00  HGB 11.1* 11.9* 11.8*  HCT 35* 36.7 37  MCV  --  81.6  --   PLT 240 280 263   No results found for: "TSH" Lab Results  Component Value Date   HGBA1C 5.6 04/26/2022   Lab Results  Component Value Date   CHOL 123 03/21/2023   HDL 45 03/21/2023   LDLCALC 53 03/21/2023   LDLDIRECT 114 (H) 04/26/2022   TRIG 176 (A) 03/21/2023   CHOLHDL 4.4 04/26/2022    Significant Diagnostic Results in last 30 days:  No results found.  Assessment/Plan Anemia of chronic disease Recent hgb stable, no signs of blood loss, continue to monitor   Chronic diastolic CHF (congestive heart failure) (HCC) Euvolemic, continue to monitor.   CVA, old, cognitive deficits Stable, continues on statin, ASA with proper bp control.   Hypertension Blood pressure well controlled, goal bp <140/90 Continue current medications and dietary modifications follow metabolic panel   Moderate episode of recurrent major depressive disorder (HCC) Stable, continues on zoloft  without signs of depression at this time.   Protein-calorie malnutrition, moderate (HCC) Weight has been stable, continue to monitor.   GERD (gastroesophageal reflux disease) Stable, controlled on protonix  40 mg daily   Hyperlipidemia Stable on lipitor 40 mg daily   Constipation Controlled on current regimen      Tauriel Scronce K. Denney Fisher[facility] [phone]

## 2023-07-14 NOTE — Assessment & Plan Note (Signed)
 Stable, continues on zoloft  without signs of depression at this time.

## 2023-09-12 ENCOUNTER — Non-Acute Institutional Stay (SKILLED_NURSING_FACILITY): Payer: Self-pay | Admitting: Student

## 2023-09-12 ENCOUNTER — Encounter: Payer: Self-pay | Admitting: Student

## 2023-09-12 DIAGNOSIS — F419 Anxiety disorder, unspecified: Secondary | ICD-10-CM

## 2023-09-12 DIAGNOSIS — I159 Secondary hypertension, unspecified: Secondary | ICD-10-CM

## 2023-09-12 DIAGNOSIS — I639 Cerebral infarction, unspecified: Secondary | ICD-10-CM

## 2023-09-12 DIAGNOSIS — E440 Moderate protein-calorie malnutrition: Secondary | ICD-10-CM | POA: Diagnosis not present

## 2023-09-12 DIAGNOSIS — F331 Major depressive disorder, recurrent, moderate: Secondary | ICD-10-CM | POA: Diagnosis not present

## 2023-09-12 DIAGNOSIS — I5032 Chronic diastolic (congestive) heart failure: Secondary | ICD-10-CM | POA: Diagnosis not present

## 2023-09-12 NOTE — Progress Notes (Signed)
 Location:  Other [facility].  Nursing Home Room Number: [facility] [address]A Place of Service:  SNF [phone]) Provider:  Abdul Fine, MD  Patient Care Team: Abdul Fine, MD as PCP - General (Family Medicine) [name], MD as Consulting Physician (Cardiology)  Extended Emergency Contact Information Primary Emergency Contact: Gladd,james Sr. Home Phone: [phone] Mobile Phone: [phone] Relation: Spouse Secondary Emergency Contact: VICCI MICKEY AGENT Home Phone: [phone] Mobile Phone: [phone] Relation: Son  Code Status:  DNR Goals of care: Advanced Directive information    07/14/2023    1:32 PM  Advanced Directives  Does Patient Have a Medical Advance Directive? Yes  Type of Advance Directive Out of facility DNR (pink MOST or yellow form)  Does patient want to make changes to medical advance directive? No - Patient declined     Chief Complaint  Patient presents with   Medical Management of Chronic Issues    Medical Management of Chronic Issues.     HPI:  Pt is a 89 y.o. female seen today for medical management of chronic diseases.   History of Present Illness The patient presents for a geriatric evaluation and medication review.  She has been eating well and engaging in some exercise, although her activity level has decreased, as evidenced by sitting in a chair for extended periods.  She is currently taking sertraline  75 mg daily for mood, Imodium  and Zofran  as needed for diarrhea or nausea, Flonase as needed for allergies, labetalol  for blood pressure, vitamin D  due to a history of vitamin B12 deficiency, and atorvastatin  for a history of stroke. She feels she does not need much medication as she seems to be fine.  She recognizes familiar faces but struggles with recalling names. She is aware of her location in [place], [place] .  Past Medical History - Vitamin B12 deficiency - Stroke - Hypertension - Mood disorder - Allergic  rhinitis    Medications - Flomax  - Sertraline  75 mg daily - Imodium  as needed - Zofran  as needed - Flonase as needed - Labetalol  - Vitamin D  - Atorvastatin   Physical Exam CHEST: Lungs clear to auscultation bilaterally. CARDIOVASCULAR: Heart regular rhythm. EXTREMITIES: No edema in legs or feet. NEUROLOGICAL: Alert and oriented to self.  Assessment and Plan   Past Medical History:  Diagnosis Date   Actinic keratosis    Breast cancer (HCC)    C1 cervical fracture (HCC)    Hypertension    Osteoarthritis    Past Surgical History:  Procedure Laterality Date   BACK SURGERY     BIOPSY  05/03/2022   Procedure: BIOPSY;  Surgeon: Leigh Elspeth SQUIBB, MD;  Location: [facility] ENDOSCOPY;  Service: Gastroenterology;;   ESOPHAGOGASTRODUODENOSCOPY (EGD) WITH PROPOFOL  N/A 05/03/2022   Procedure: ESOPHAGOGASTRODUODENOSCOPY (EGD) WITH PROPOFOL ;  Surgeon: Leigh Elspeth SQUIBB, MD;  Location: [facility] ENDOSCOPY;  Service: Gastroenterology;  Laterality: N/A;   HEMOSTASIS CONTROL  05/03/2022   Procedure: HEMOSTASIS CONTROL;  Surgeon: Leigh Elspeth SQUIBB, MD;  Location: [facility] ENDOSCOPY;  Service: Gastroenterology;;   IR INJECT/THERA/INC NEEDLE/CATH/PLC EPI/LUMB/SAC [facility]  04/29/2021   MOHS SURGERY      Allergies  Allergen Reactions   Morphine     Other reaction(s): Other (See Comments) Apnea with large dose Respiratory Distress    Buprenorphine Hcl     Other reaction(s): Other (See Comments), Other (See Comments) Respiratory Distress Respiratory Distress    Codeine    Epinephrine      Other reaction(s): Other (See Comments) Severe anxiety; makes my heart race - would like to avoid  during surgery   Morphine And Codeine Other (See Comments)    Respiratory Distress    Percocet [Oxycodone -Acetaminophen ]     Outpatient Encounter Medications as of 09/12/2023  Medication Sig   acetaminophen  (TYLENOL ) 500 MG tablet Take 1,000 mg by mouth every 8 (eight) hours as needed.   amLODipine  (NORVASC ) 5 MG  tablet Take 1 tablet (5 mg total) by mouth daily.   aspirin  EC 81 MG tablet Take 1 tablet (81 mg total) by mouth daily. Swallow whole.   atorvastatin  (LIPITOR) 40 MG tablet Take 1 tablet (40 mg total) by mouth daily.   cholecalciferol  (VITAMIN D ) 25 MCG (1000 UNIT) tablet Take 2,000 Units by mouth daily.   Cyanocobalamin  (B-12) 1000 MCG CAPS Take 2,000 mcg by mouth daily.   fluticasone (FLONASE) 50 MCG/ACT nasal spray Place 2 sprays into both nostrils daily.   labetalol  (NORMODYNE ) 200 MG tablet Take 200 mg by mouth 2 (two) times daily.   lactose free nutrition (BOOST) LIQD Take 237 mLs by mouth 3 (three) times daily between meals.   loperamide  (IMODIUM  A-D) 2 MG tablet Take 2 mg by mouth every 6 (six) hours as needed for diarrhea or loose stools.   ondansetron  (ZOFRAN ) 8 MG tablet Take 8 mg by mouth every 8 (eight) hours as needed for nausea or vomiting.   pantoprazole  (PROTONIX ) 40 MG tablet Take 40 mg by mouth daily.   senna (SENOKOT) 8.6 MG TABS tablet Take 2 tablets by mouth at bedtime.   sertraline  (ZOLOFT ) 50 MG tablet Take 75 mg by mouth at bedtime.   tamsulosin  (FLOMAX ) 0.4 MG CAPS capsule Take 0.4 mg by mouth daily after supper.   Zinc  Oxide (TRIPLE PASTE) 12.8 % ointment Apply 1 Application topically as needed for irritation.   No facility-administered encounter medications on file as of 09/12/2023.    Review of Systems  Immunization History  Administered Date(s) Administered   Influenza Inj Mdck Quad Pf 11/30/2021   Influenza, High Dose Seasonal PF 12/16/2020   Influenza-Unspecified 12/06/2011, 11/27/2012, 01/19/2014, 12/11/2014, 12/03/2015, 12/10/2016, 12/18/2019, 12/22/2022   PFIZER Comirnaty(Gray Top)Covid-19 Tri-Sucrose Vaccine 03/13/2019, 04/03/2019, 12/05/2019   Pneumococcal Conjugate-13 12/31/2016, 01/05/2017   Pneumococcal Polysaccharide-23 01/05/2018   Td (Adult),5 Lf Tetanus Toxid, Preservative Free 07/16/2019   Tdap 06/18/2011   Unspecified SARS-COV-2 Vaccination  03/12/2019, 04/02/2019, 12/05/2019   Zoster Recombinant(Shingrix) 06/30/2016, 10/26/2017, 01/06/2018   Pertinent  Health Maintenance Due  Topic Date Due   INFLUENZA VACCINE  09/30/2023   DEXA SCAN  Completed      01/09/2022    9:00 AM 01/09/2022    8:25 PM 01/10/2022    8:50 AM 08/16/2022   10:39 AM 03/01/2023   11:44 AM  Fall Risk  Falls in the past year?    1 1  Was there an injury with Fall?    1 1  Fall Risk Category Calculator    3 3  (RETIRED) Patient Fall Risk Level High fall risk  High fall risk  High fall risk     Patient at Risk for Falls Due to    History of fall(s);Impaired balance/gait;Impaired mobility History of fall(s);Impaired balance/gait;Impaired mobility  Fall risk Follow up    Falls evaluation completed Education provided;Falls prevention discussed     Data saved with a previous flowsheet row definition   Functional Status Survey:    Vitals:   09/12/23 1244 09/12/23 1249  BP: (!) 144/71 121/63  Pulse: 69   Resp: 18   Temp: 98.4 F (36.9 C)   SpO2:  96%   Weight: 136 lb 9.6 oz (62 kg)   Height: 5' 2 (1.575 m)    Body mass index is 24.98 kg/m. Physical Exam  Labs reviewed: Recent Labs    03/21/23 0000 04/10/23 1732 06/09/23 0000  NA 142 137 140  K 4.4 4.0 4.6  CL 103 99 103  CO2 31* 25 27*  GLUCOSE  --  100*  --   BUN 19 28* 20  CREATININE 0.7 0.70 0.7  CALCIUM  9.4 9.5  --    Recent Labs    03/21/23 0000 04/10/23 1732 06/09/23 0000  AST 16 28 18   ALT 12 19 14   ALKPHOS 53 57 61  BILITOT  --  0.6  --   PROT  --  6.5  --   ALBUMIN 3.7 3.7 3.8   Recent Labs    03/21/23 0000 04/10/23 1732 06/09/23 0000  WBC 6.8 9.1 8.0  NEUTROABS 3,454.00 5.6 5,048.00  HGB 11.1* 11.9* 11.8*  HCT 35* 36.7 37  MCV  --  81.6  --   PLT 240 280 263   No results found for: TSH Lab Results  Component Value Date   HGBA1C 5.6 04/26/2022   Lab Results  Component Value Date   CHOL 123 03/21/2023   HDL 45 03/21/2023   LDLCALC 53 03/21/2023    LDLDIRECT 114 (H) 04/26/2022   TRIG 176 (A) 03/21/2023   CHOLHDL 4.4 04/26/2022    Significant Diagnostic Results in last 30 days:  No results found.  Assessment/Plan Cognitive impairment Disorientation to time and place, oriented to self only. Difficulty recalling names and current date.  CHF Weight is stable. Appears euvolemic on exam. No diuretics at this time.   Hx of Stroke Atorvastatin  prescribed due to history of stroke.  Anxiety Managed with sertraline  75 mg daily. - Advised to use Imodium  and Zofran  for diarrhea or nausea if needed.  Hypertension Labetalol  prescribed for hypertension.  Vitamin D  deficiency Vitamin D  supplementation prescribed due to deficiency.   Family/ staff Communication: nursing  Labs/tests ordered:  none

## 2023-10-26 ENCOUNTER — Non-Acute Institutional Stay (SKILLED_NURSING_FACILITY): Payer: Self-pay | Admitting: Student

## 2023-10-26 ENCOUNTER — Encounter: Payer: Self-pay | Admitting: Student

## 2023-10-26 DIAGNOSIS — F419 Anxiety disorder, unspecified: Secondary | ICD-10-CM

## 2023-10-26 DIAGNOSIS — I69319 Unspecified symptoms and signs involving cognitive functions following cerebral infarction: Secondary | ICD-10-CM

## 2023-10-26 DIAGNOSIS — F4320 Adjustment disorder, unspecified: Secondary | ICD-10-CM

## 2023-10-26 DIAGNOSIS — E440 Moderate protein-calorie malnutrition: Secondary | ICD-10-CM

## 2023-10-26 DIAGNOSIS — I5032 Chronic diastolic (congestive) heart failure: Secondary | ICD-10-CM | POA: Diagnosis not present

## 2023-10-26 NOTE — Progress Notes (Unsigned)
 Location:  Other [place].  Nursing Home Room Number: [facility] [address]A Place of Service:  SNF [phone]) Provider:  Abdul Fine, MD  Patient Care Team: Abdul Fine, MD as PCP - General (Family Medicine) [name], MD as Consulting Physician (Cardiology)  Extended Emergency Contact Information Primary Emergency Contact: Ackley,james Sr. Home Phone: [phone] Mobile Phone: [phone] Relation: Spouse Secondary Emergency Contact: VICCI MICKEY AGENT Home Phone: [phone] Mobile Phone: [phone] Relation: Son  Code Status:  DNR Goals of care: Advanced Directive information    10/26/2023    2:52 PM  Advanced Directives  Does Patient Have a Medical Advance Directive? Yes  Type of Advance Directive Out of facility DNR (pink MOST or yellow form)  Does patient want to make changes to medical advance directive? No - Patient declined     Chief Complaint  Patient presents with   Medical Management of Chronic Issues    Medical Management of Chronic issues.     HPI:  Pt is a 89 y.o. female seen today for medical management of chronic diseases.   History of Present Illness She presents with anxiety and sadness following the recent passing of her husband. She is accompanied by her son.  She has been crying frequently and has not taken any Ativan  today, which is prescribed at a dose of 0.5 mg every 4 hours as needed for anxiety.  Past Medical History - Anxiety    Medications - Ativan  (as needed for anxiety)  Family History - Husband: deceased due to heart problem   Assessment and Plan Grief due to death of family member She is experiencing significant grief following the recent death of her husband, which is a normal response after a long marriage of 49 years.  Anxiety symptoms Anxiety symptoms are likely exacerbated by her grief. - Administer Ativan  0.5 mg as needed every 4 hours for anxiety.  Past Medical History:  Diagnosis Date    Actinic keratosis    Breast cancer (HCC)    C1 cervical fracture (HCC)    Hypertension    Osteoarthritis    Past Surgical History:  Procedure Laterality Date   BACK SURGERY     BIOPSY  05/03/2022   Procedure: BIOPSY;  Surgeon: Leigh Elspeth SQUIBB, MD;  Location: [facility] ENDOSCOPY;  Service: Gastroenterology;;   ESOPHAGOGASTRODUODENOSCOPY (EGD) WITH PROPOFOL  N/A 05/03/2022   Procedure: ESOPHAGOGASTRODUODENOSCOPY (EGD) WITH PROPOFOL ;  Surgeon: Leigh Elspeth SQUIBB, MD;  Location: [facility] ENDOSCOPY;  Service: Gastroenterology;  Laterality: N/A;   HEMOSTASIS CONTROL  05/03/2022   Procedure: HEMOSTASIS CONTROL;  Surgeon: Leigh Elspeth SQUIBB, MD;  Location: [facility] ENDOSCOPY;  Service: Gastroenterology;;   IR INJECT/THERA/INC NEEDLE/CATH/PLC EPI/LUMB/SAC [facility]  04/29/2021   MOHS SURGERY      Allergies  Allergen Reactions   Morphine     Other reaction(s): Other (See Comments) Apnea with large dose Respiratory Distress    Buprenorphine Hcl     Other reaction(s): Other (See Comments), Other (See Comments) Respiratory Distress Respiratory Distress    Codeine    Epinephrine      Other reaction(s): Other (See Comments) Severe anxiety; makes my heart race - would like to avoid during surgery   Morphine And Codeine Other (See Comments)    Respiratory Distress    Percocet [Oxycodone -Acetaminophen ]     Outpatient Encounter Medications as of 10/26/2023  Medication Sig   acetaminophen  (TYLENOL ) 500 MG tablet Take 1,000 mg by mouth every 8 (eight) hours as needed.   amLODipine  (NORVASC ) 5 MG tablet Take 1 tablet (5 mg  total) by mouth daily.   aspirin  EC 81 MG tablet Take 1 tablet (81 mg total) by mouth daily. Swallow whole.   atorvastatin  (LIPITOR) 40 MG tablet Take 1 tablet (40 mg total) by mouth daily.   cholecalciferol  (VITAMIN D ) 25 MCG (1000 UNIT) tablet Take 2,000 Units by mouth daily.   Cyanocobalamin  (B-12) 1000 MCG CAPS Take 2,000 mcg by mouth daily.   fluticasone (FLONASE) 50 MCG/ACT nasal spray  Place 2 sprays into both nostrils daily.   labetalol  (NORMODYNE ) 200 MG tablet Take 200 mg by mouth 2 (two) times daily.   lactose free nutrition (BOOST) LIQD Take 237 mLs by mouth 3 (three) times daily between meals.   loperamide  (IMODIUM  A-D) 2 MG tablet Take 2 mg by mouth every 6 (six) hours as needed for diarrhea or loose stools.   LORazepam  (ATIVAN ) 0.5 MG tablet Take 0.5 mg by mouth every 4 (four) hours as needed for anxiety.   ondansetron  (ZOFRAN ) 8 MG tablet Take 8 mg by mouth every 8 (eight) hours as needed for nausea or vomiting.   pantoprazole  (PROTONIX ) 40 MG tablet Take 40 mg by mouth daily.   senna (SENOKOT) 8.6 MG TABS tablet Take 2 tablets by mouth at bedtime.   sertraline  (ZOLOFT ) 50 MG tablet Take 75 mg by mouth at bedtime.   tamsulosin  (FLOMAX ) 0.4 MG CAPS capsule Take 0.4 mg by mouth daily after supper.   Zinc  Oxide (TRIPLE PASTE) 12.8 % ointment Apply 1 Application topically as needed for irritation.   No facility-administered encounter medications on file as of 10/26/2023.    Review of Systems  Immunization History  Administered Date(s) Administered   INFLUENZA, HIGH DOSE SEASONAL PF 12/16/2020   Influenza Inj Mdck Quad Pf 11/30/2021   Influenza-Unspecified 12/06/2011, 11/27/2012, 01/19/2014, 12/11/2014, 12/03/2015, 12/10/2016, 12/18/2019, 12/22/2022   PFIZER Comirnaty(Gray Top)Covid-19 Tri-Sucrose Vaccine 03/13/2019, 04/03/2019, 12/05/2019   Pneumococcal Conjugate-13 12/31/2016, 01/05/2017   Pneumococcal Polysaccharide-23 01/05/2018   Td (Adult),5 Lf Tetanus Toxid, Preservative Free 07/16/2019   Tdap 06/18/2011   Unspecified SARS-COV-2 Vaccination 03/12/2019, 04/02/2019, 12/05/2019   Zoster Recombinant(Shingrix) 06/30/2016, 10/26/2017, 01/06/2018   Pertinent  Health Maintenance Due  Topic Date Due   INFLUENZA VACCINE  09/30/2023   DEXA SCAN  Completed      01/09/2022    9:00 AM 01/09/2022    8:25 PM 01/10/2022    8:50 AM 08/16/2022   10:39 AM 03/01/2023    11:44 AM  Fall Risk  Falls in the past year?    1 1  Was there an injury with Fall?    1 1  Fall Risk Category Calculator    3 3  (RETIRED) Patient Fall Risk Level High fall risk  High fall risk  High fall risk     Patient at Risk for Falls Due to    History of fall(s);Impaired balance/gait;Impaired mobility History of fall(s);Impaired balance/gait;Impaired mobility  Fall risk Follow up    Falls evaluation completed Education provided;Falls prevention discussed     Data saved with a previous flowsheet row definition   Functional Status Survey:    Vitals:   10/26/23 1447  BP: (!) 140/79  Pulse: 65  Resp: 16  Temp: 97.7 F (36.5 C)  SpO2: 95%  Weight: 139 lb 9.6 oz (63.3 kg)  Height: 5' 2 (1.575 m)   Body mass index is 25.53 kg/m. Physical Exam  Labs reviewed: Recent Labs    03/21/23 0000 04/10/23 1732 06/09/23 0000  NA 142 137 140  K 4.4 4.0 4.6  CL 103 99 103  CO2 31* 25 27*  GLUCOSE  --  100*  --   BUN 19 28* 20  CREATININE 0.7 0.70 0.7  CALCIUM  9.4 9.5  --    Recent Labs    03/21/23 0000 04/10/23 1732 06/09/23 0000  AST 16 28 18   ALT 12 19 14   ALKPHOS 53 57 61  BILITOT  --  0.6  --   PROT  --  6.5  --   ALBUMIN 3.7 3.7 3.8   Recent Labs    03/21/23 0000 04/10/23 1732 06/09/23 0000  WBC 6.8 9.1 8.0  NEUTROABS 3,454.00 5.6 5,048.00  HGB 11.1* 11.9* 11.8*  HCT 35* 36.7 37  MCV  --  81.6  --   PLT 240 280 263   No results found for: TSH Lab Results  Component Value Date   HGBA1C 5.6 04/26/2022   Lab Results  Component Value Date   CHOL 123 03/21/2023   HDL 45 03/21/2023   LDLCALC 53 03/21/2023   LDLDIRECT 114 (H) 04/26/2022   TRIG 176 (A) 03/21/2023   CHOLHDL 4.4 04/26/2022    Significant Diagnostic Results in last 30 days:  No results found.  Assessment/Plan There are no diagnoses linked to this encounter.   Family/ staff Communication: ***  Labs/tests ordered:  ***

## 2023-10-28 ENCOUNTER — Encounter: Payer: Self-pay | Admitting: Student

## 2023-10-31 IMAGING — DX DG ABD PORTABLE 1V
1 series · 1 of 1 positions shown · non-contrast
Comparison: CT 05/17/2021

CLINICAL DATA: 89-year-old female with duodenitis

EXAM:
PORTABLE ABDOMEN - 1 VIEW

[abdomen supine]
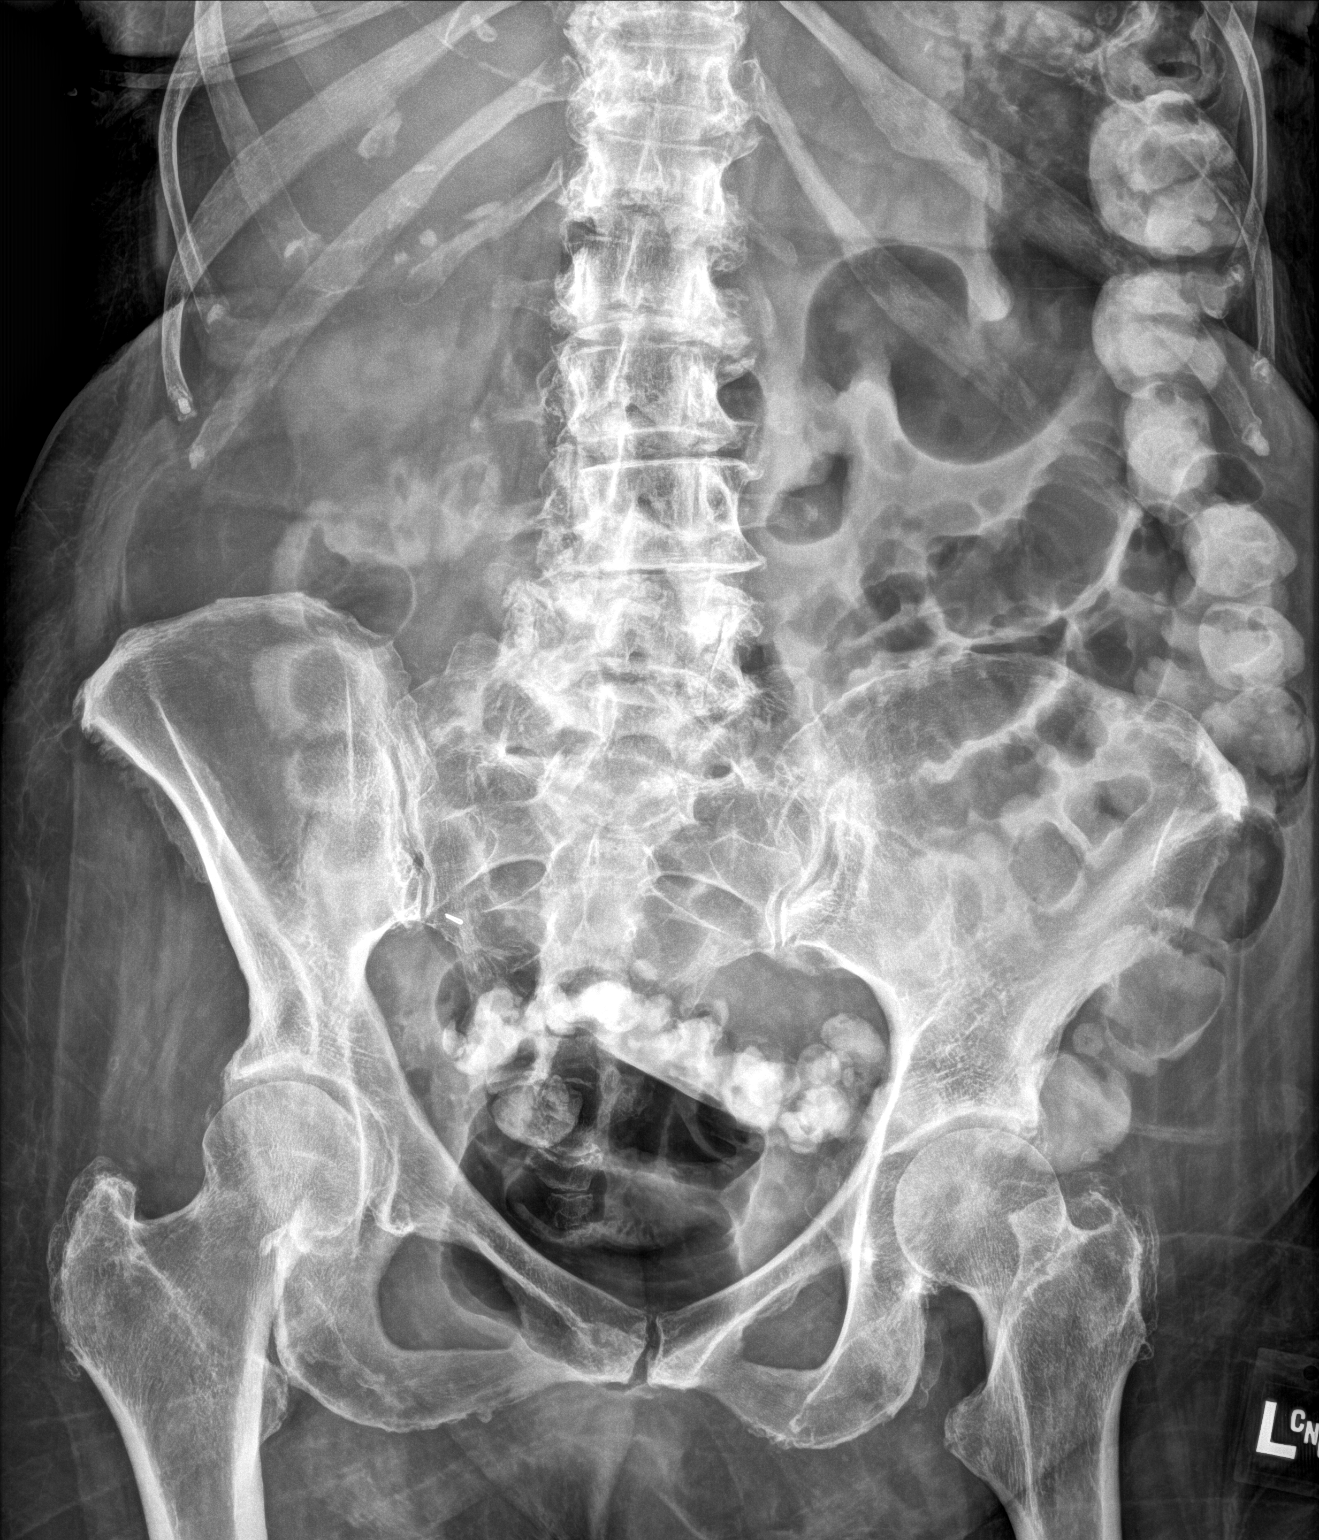

[1 of 1 positions shown; findings below may reference images not displayed]

FINDINGS: Retained enteric contrast within the left:, trace within the right
colon.

Gas within stomach and small bowel. No significant distention. No
air-fluid levels. Gas extends to the rectum.

Degenerative changes of the spine. No acute displaced fracture. No
radiopaque foreign body.
IMPRESSION: Nonobstructive bowel gas pattern.

## 2023-11-01 ENCOUNTER — Other Ambulatory Visit: Payer: Self-pay | Admitting: Nurse Practitioner

## 2023-11-01 DIAGNOSIS — F419 Anxiety disorder, unspecified: Secondary | ICD-10-CM

## 2023-11-01 MED ORDER — LORAZEPAM 0.5 MG PO TABS: 0.5000 mg | ORAL_TABLET | Freq: Three times a day (TID) | ORAL | 0 refills | Status: DC | PRN

## 2023-11-02 IMAGING — CT CT HIP*L* W/O CM
2 of 3 series · 17 of 46 positions shown, 19 images · non-contrast
Comparison: X-ray 05/05/2021, CT 05/17/2021

CLINICAL DATA: Chronic hip pain.  No known injury.

EXAM:
CT OF THE LEFT HIP WITHOUT CONTRAST
TECHNIQUE: Multidetector CT imaging of the left hip was performed according to
the standard protocol. Multiplanar CT image reconstructions were
also generated.
RADIATION DOSE REDUCTION: This exam was performed according to the
departmental dose-optimization program which includes automated
exposure control, adjustment of the mA and/or kV according to
patient size and/or use of iterative reconstruction technique.

[Series 4: soft tissue · axial · 0.46mm/px · z∈[-1306,-1108]mm · 14 of 115 slices shown, 16 images]
[im 8/115  soft-tissue]
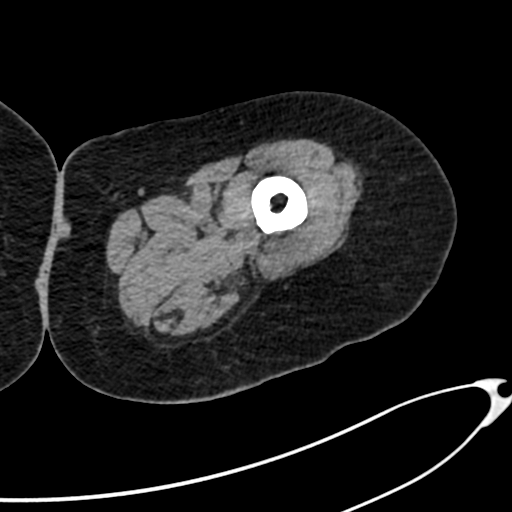
[im 8/115  bone]
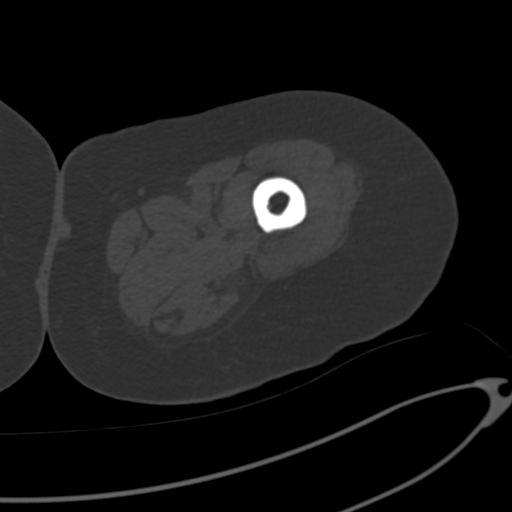
[im 15/115  soft-tissue]
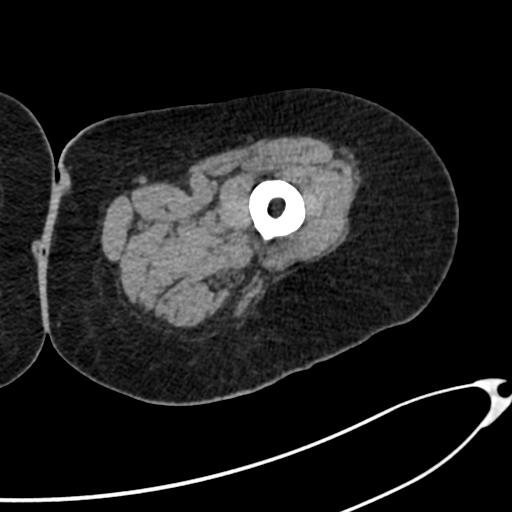
[im 23/115  soft-tissue]
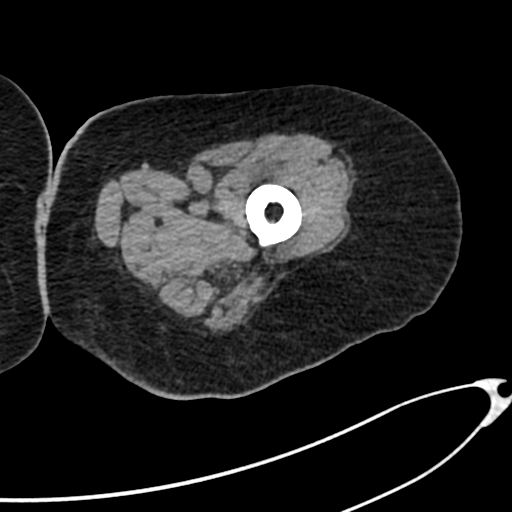
[im 30/115  soft-tissue]
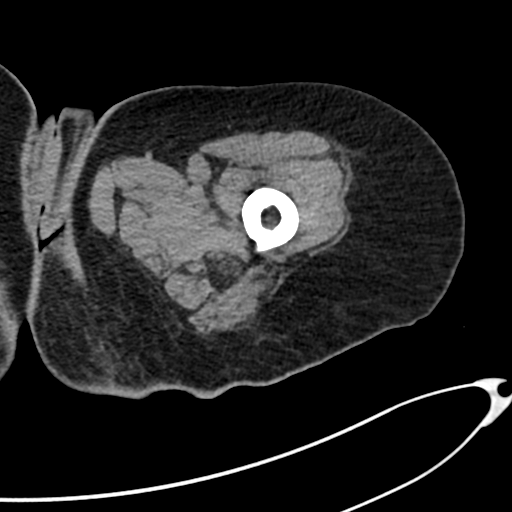
[im 37/115  soft-tissue]
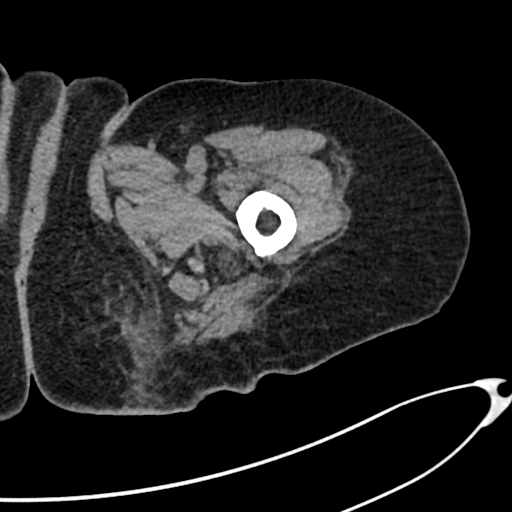
[im 45/115  soft-tissue]
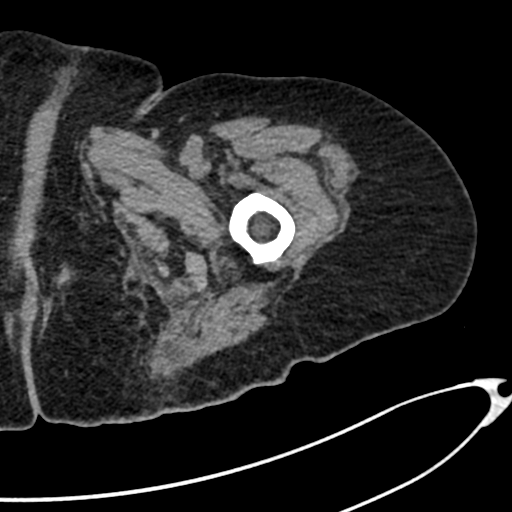
[im 52/115  soft-tissue]
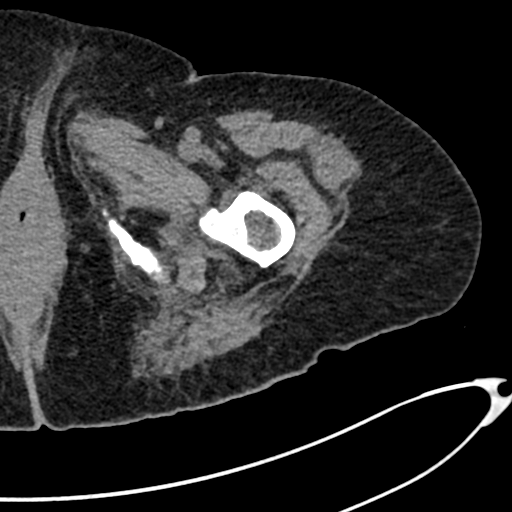
[im 63/115  soft-tissue]
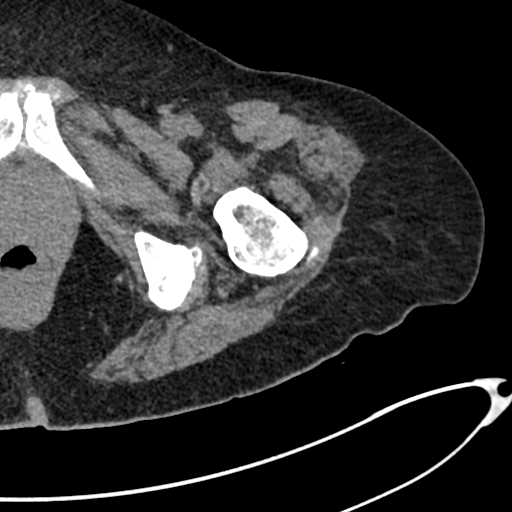
[im 70/115  soft-tissue]
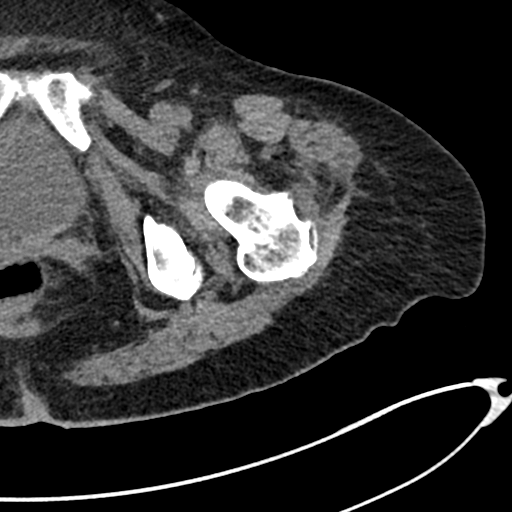
[im 70/115  bone]
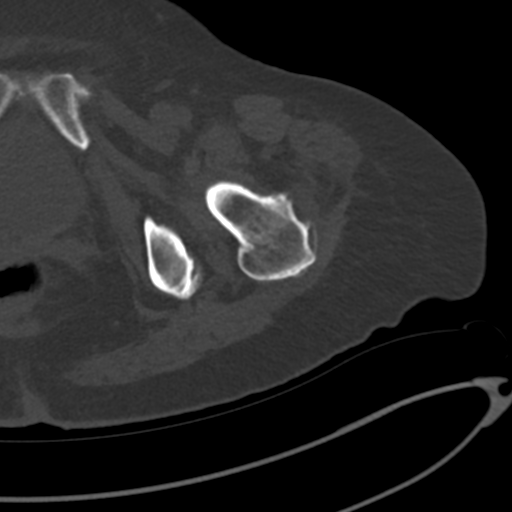
[im 78/115  soft-tissue]
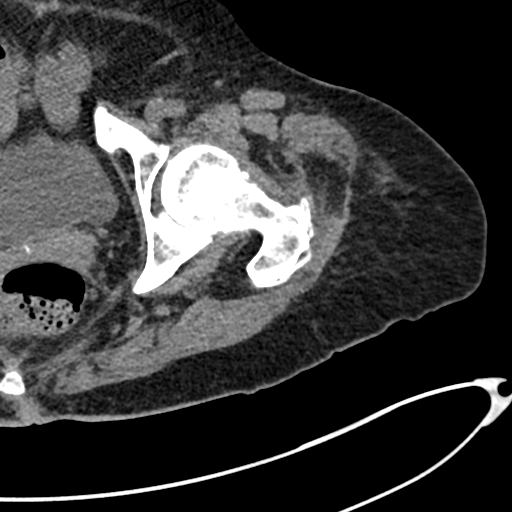
[im 85/115  soft-tissue]
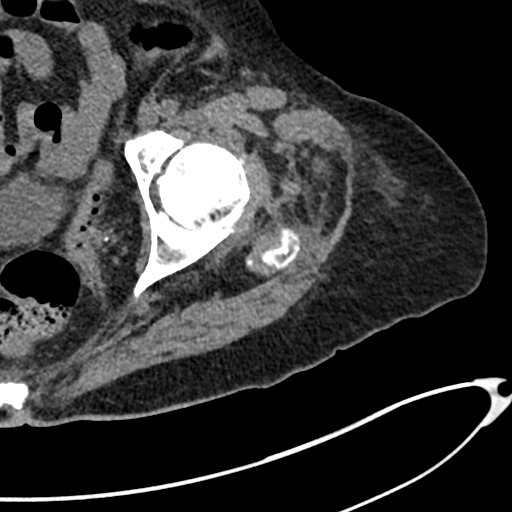
[im 92/115  soft-tissue]
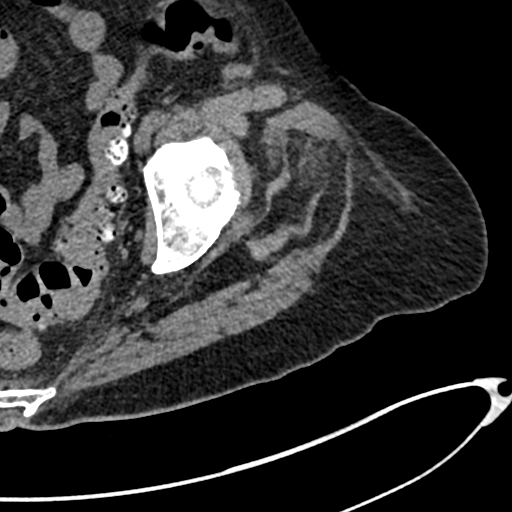
[im 100/115  soft-tissue]
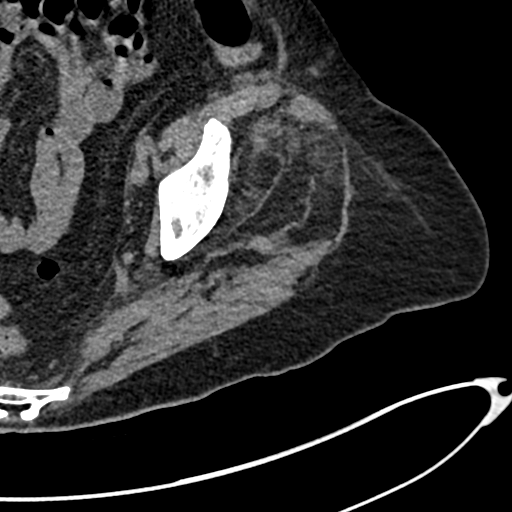
[im 107/115  soft-tissue]
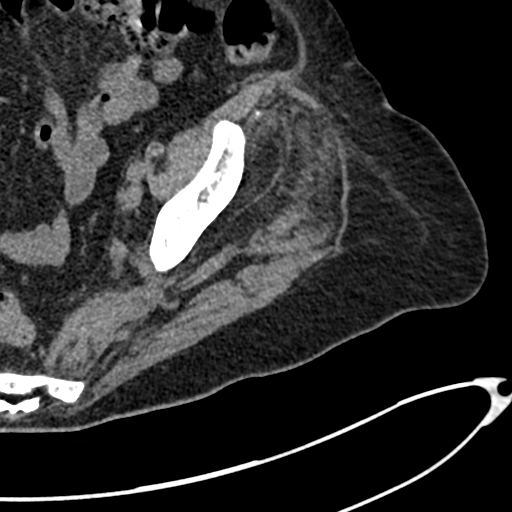

[Series 5: cor soft · coronal · 0.48mm/px · 3 of 128 slices shown]
[im 43/128  soft-tissue]
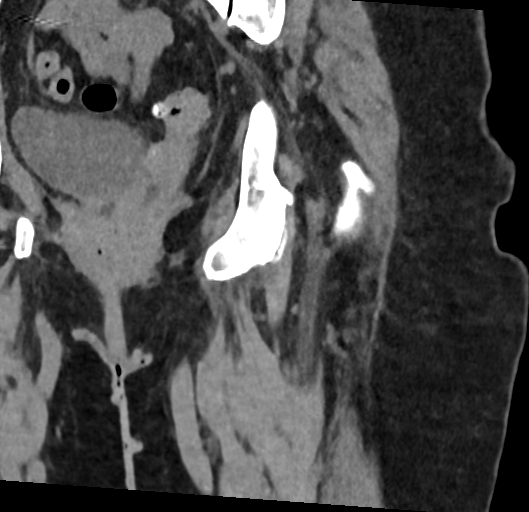
[im 57/128  soft-tissue]
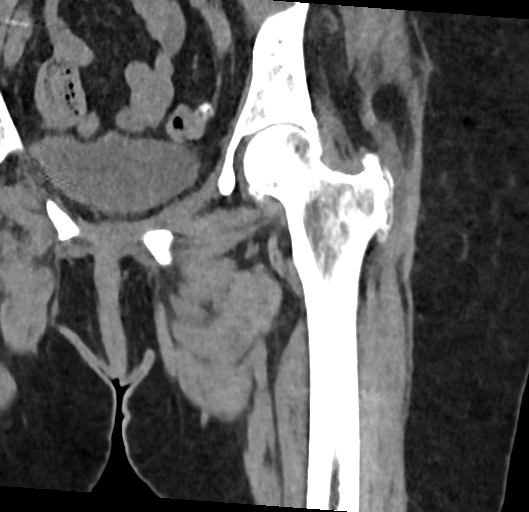
[im 71/128  soft-tissue]
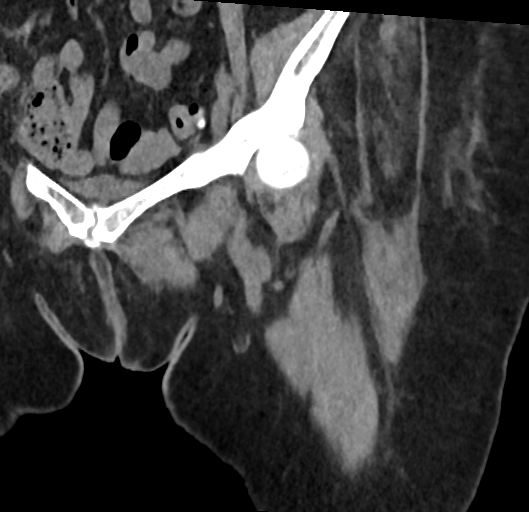

[17 of 46 positions shown; findings below may reference images not displayed]

FINDINGS: Bones/Joint/Cartilage

No acute fracture. No dislocation. Moderate osteoarthritis of the
left hip joint as manifested by joint space narrowing, subchondral
sclerosis/cystic change, and marginal osteophyte formation. Chronic
enthesopathic spurring of the greater trochanter. There is also
enthesopathy at the hamstring tendon origin. Mild-moderate
arthropathy of the pubic symphysis. No lytic or sclerotic bone
lesion.

Ligaments

Suboptimally assessed by CT.

Muscles and Tendons

Fatty atrophy of the gluteus medius and minimus muscles. No acute
musculotendinous abnormality by CT.

Soft tissues

No soft tissue edema or fluid collection. No left inguinal
lymphadenopathy. Diverticular changes of the visualized colon.
IMPRESSION: 1. No acute fracture or dislocation of the left hip.
2. Moderate osteoarthritis of the left hip.

## 2023-12-19 ENCOUNTER — Encounter: Payer: Self-pay | Admitting: Internal Medicine

## 2023-12-19 ENCOUNTER — Non-Acute Institutional Stay (SKILLED_NURSING_FACILITY): Payer: Self-pay | Admitting: Internal Medicine

## 2023-12-19 DIAGNOSIS — F419 Anxiety disorder, unspecified: Secondary | ICD-10-CM

## 2023-12-19 DIAGNOSIS — I10 Essential (primary) hypertension: Secondary | ICD-10-CM

## 2023-12-19 DIAGNOSIS — I5032 Chronic diastolic (congestive) heart failure: Secondary | ICD-10-CM | POA: Diagnosis not present

## 2023-12-19 DIAGNOSIS — Z66 Do not resuscitate: Secondary | ICD-10-CM

## 2023-12-19 DIAGNOSIS — F331 Major depressive disorder, recurrent, moderate: Secondary | ICD-10-CM | POA: Diagnosis not present

## 2023-12-19 DIAGNOSIS — D638 Anemia in other chronic diseases classified elsewhere: Secondary | ICD-10-CM

## 2023-12-19 DIAGNOSIS — I69319 Unspecified symptoms and signs involving cognitive functions following cerebral infarction: Secondary | ICD-10-CM | POA: Diagnosis not present

## 2023-12-19 DIAGNOSIS — K219 Gastro-esophageal reflux disease without esophagitis: Secondary | ICD-10-CM

## 2023-12-19 DIAGNOSIS — K5900 Constipation, unspecified: Secondary | ICD-10-CM

## 2023-12-19 DIAGNOSIS — E785 Hyperlipidemia, unspecified: Secondary | ICD-10-CM

## 2023-12-19 NOTE — Assessment & Plan Note (Addendum)
 Monitor BP. Currently on labetalol  200 mg bid. May need adjustment to meds on next routine visit if BP are consistently elevated beyond 140/90.

## 2023-12-19 NOTE — Assessment & Plan Note (Addendum)
Stable. On lipitor 40 mg daily.

## 2023-12-19 NOTE — Progress Notes (Signed)
 [facility] Routine Visit Progress Note    Location:  Other Nursing Home Room Number: [address] of Service:  SNF (31)   [name], DO   Patient Care Team: [name], DO as PCP - General (Internal Medicine) Perla, Evalene PARAS, MD as Consulting Physician (Cardiology)   Extended Emergency Contact Information Primary Emergency Contact: Dorrough,james Sr. Home Phone: [phone] Mobile Phone: [phone] Relation: Spouse Secondary Emergency Contact: VICCI MICKEY AGENT Home Phone: [phone] Mobile Phone: [phone] Relation: Son   Goals of care: Advanced Directive information    10/26/2023    2:52 PM  Advanced Directives  Does Patient Have a Medical Advance Directive? Yes  Type of Advance Directive Out of facility DNR (pink MOST or yellow form)  Does patient want to make changes to medical advance directive? No - Patient declined    CODE STATUS: Full Code Do Not Resuscitate (DNR)   Chief Complaint  Patient presents with   Medical Management of Chronic Issues    Routine SNF visit     HPI: Pt is a 89 y.o. female seen today for medical management of chronic disease.  Patient seen today for routine SNF visit.  She is a 89 year old female with a prior history of left-sided CVA, essential hypertension, reflux, constipation, likely vascular dementia, who has been at [facility] since March 2024.  Patient is not oriented to place or time.  She does recognize her name.  She thinks she is in [place].  She thinks it is around the year 2020.  She thinks that is August.  She has no specific complaints.  Nursing has no specific acute issues.     Past Medical History:  Diagnosis Date   Actinic keratosis    Breast cancer (HCC)    C1 cervical fracture (HCC)    Hypertension    Left-sided cerebrovascular accident (CVA) (HCC) 04/25/2022   Osteoarthritis    Peptic ulcer 05/03/2022   Thrombocytosis 05/15/2021   Past Surgical History:  Procedure Laterality Date    BACK SURGERY     BIOPSY  05/03/2022   Procedure: BIOPSY;  Surgeon: Leigh Elspeth SQUIBB, MD;  Location: [facility] ENDOSCOPY;  Service: Gastroenterology;;   ESOPHAGOGASTRODUODENOSCOPY (EGD) WITH PROPOFOL  N/A 05/03/2022   Procedure: ESOPHAGOGASTRODUODENOSCOPY (EGD) WITH PROPOFOL ;  Surgeon: Leigh Elspeth SQUIBB, MD;  Location: [facility] ENDOSCOPY;  Service: Gastroenterology;  Laterality: N/A;   HEMOSTASIS CONTROL  05/03/2022   Procedure: HEMOSTASIS CONTROL;  Surgeon: Leigh Elspeth SQUIBB, MD;  Location: [facility] ENDOSCOPY;  Service: Gastroenterology;;   IR INJECT/THERA/INC NEEDLE/CATH/PLC EPI/LUMB/SAC [facility]  04/29/2021   MOHS SURGERY       Allergies  Allergen Reactions   Morphine     Other reaction(s): Other (See Comments) Apnea with large dose Respiratory Distress    Buprenorphine Hcl     Other reaction(s): Other (See Comments), Other (See Comments) Respiratory Distress Respiratory Distress    Codeine    Epinephrine      Other reaction(s): Other (See Comments) Severe anxiety; makes my heart race - would like to avoid during surgery   Morphine And Codeine Other (See Comments)    Respiratory Distress    Percocet [Oxycodone -Acetaminophen ]      Outpatient Encounter Medications as of 12/19/2023  Medication Sig   acetaminophen  (TYLENOL ) 500 MG tablet Take 1,000 mg by mouth every 8 (eight) hours as needed.   amLODipine  (NORVASC ) 5 MG tablet Take 1 tablet (5 mg total) by mouth daily.   aspirin  EC 81 MG tablet Take 1 tablet (81 mg total) by  mouth daily. Swallow whole.   atorvastatin  (LIPITOR) 40 MG tablet Take 1 tablet (40 mg total) by mouth daily.   cholecalciferol  (VITAMIN D ) 25 MCG (1000 UNIT) tablet Take 2,000 Units by mouth daily.   Cyanocobalamin  (B-12) 1000 MCG CAPS Take 2,000 mcg by mouth daily.   fluticasone (FLONASE) 50 MCG/ACT nasal spray Place 2 sprays into both nostrils daily.   labetalol  (NORMODYNE ) 200 MG tablet Take 200 mg by mouth 2 (two) times daily.   lactose free nutrition (BOOST) LIQD Take  237 mLs by mouth 3 (three) times daily between meals.   loperamide  (IMODIUM  A-D) 2 MG tablet Take 2 mg by mouth every 6 (six) hours as needed for diarrhea or loose stools.   ondansetron  (ZOFRAN ) 8 MG tablet Take 8 mg by mouth every 8 (eight) hours as needed for nausea or vomiting.   pantoprazole  (PROTONIX ) 40 MG tablet Take 40 mg by mouth daily.   senna (SENOKOT) 8.6 MG TABS tablet Take 2 tablets by mouth at bedtime.   sertraline  (ZOLOFT ) 100 MG tablet Take 100 mg by mouth at bedtime.   tamsulosin  (FLOMAX ) 0.4 MG CAPS capsule Take 0.4 mg by mouth daily after supper.   Zinc  Oxide (TRIPLE PASTE) 12.8 % ointment Apply 1 Application topically as needed for irritation.   [DISCONTINUED] LORazepam  (ATIVAN ) 0.5 MG tablet Take 1 tablet (0.5 mg total) by mouth every 8 (eight) hours as needed for anxiety.   No facility-administered encounter medications on file as of 12/19/2023.     Review of Systems  Unable to perform ROS: Dementia      Immunization History  Administered Date(s) Administered   INFLUENZA, HIGH DOSE SEASONAL PF 12/16/2020   Influenza Inj Mdck Quad Pf 11/30/2021   Influenza-Unspecified 12/06/2011, 11/27/2012, 01/19/2014, 12/11/2014, 12/03/2015, 12/10/2016, 12/18/2019, 12/22/2022   PFIZER Comirnaty(Gray Top)Covid-19 Tri-Sucrose Vaccine 03/13/2019, 04/03/2019, 12/05/2019   Pneumococcal Conjugate-13 12/31/2016, 01/05/2017   Pneumococcal Polysaccharide-23 01/05/2018   Td (Adult),5 Lf Tetanus Toxid, Preservative Free 07/16/2019   Tdap 06/18/2011   Unspecified SARS-COV-2 Vaccination 03/12/2019, 04/02/2019, 12/05/2019   Zoster Recombinant(Shingrix) 06/30/2016, 10/26/2017, 01/06/2018   Pertinent  Health Maintenance Due  Topic Date Due   Mammogram  Never done   Influenza Vaccine  09/30/2023   DEXA SCAN  Completed      01/09/2022    9:00 AM 01/09/2022    8:25 PM 01/10/2022    8:50 AM 08/16/2022   10:39 AM 03/01/2023   11:44 AM  Fall Risk  Falls in the past year?    1 1  Was  there an injury with Fall?    1 1  Fall Risk Category Calculator    3 3  (RETIRED) Patient Fall Risk Level High fall risk  High fall risk  High fall risk     Patient at Risk for Falls Due to    History of fall(s);Impaired balance/gait;Impaired mobility History of fall(s);Impaired balance/gait;Impaired mobility  Fall risk Follow up    Falls evaluation completed Education provided;Falls prevention discussed     Data saved with a previous flowsheet row definition   Functional Status Survey:     Vitals:   12/14/23 1609  BP: (!) 161/78  Pulse: 62  Temp: 97.6 F (36.4 C)  SpO2: 94%   There is no height or weight on file to calculate BMI. Physical Exam Vitals and nursing note reviewed.  Constitutional:      Comments: Awake and alert but not oriented to place or time. She recognizes her name.  HENT:  Head: Normocephalic and atraumatic.     Nose: Nose normal.  Cardiovascular:     Rate and Rhythm: Normal rate and regular rhythm.     Pulses: Normal pulses.     Heart sounds: Normal heart sounds.  Pulmonary:     Effort: Pulmonary effort is normal. No respiratory distress.     Breath sounds: Normal breath sounds.  Abdominal:     General: Bowel sounds are normal. There is no distension.     Palpations: Abdomen is soft.  Musculoskeletal:     Right lower leg: No edema.     Left lower leg: No edema.  Skin:    General: Skin is warm and dry.     Capillary Refill: Capillary refill takes less than 2 seconds.  Neurological:     General: No focal deficit present.     Mental Status: She is disoriented.      Labs reviewed: Recent Labs    03/21/23 0000 04/10/23 1732 06/09/23 0000  NA 142 137 140  K 4.4 4.0 4.6  CL 103 99 103  CO2 31* 25 27*  GLUCOSE  --  100*  --   BUN 19 28* 20  CREATININE 0.7 0.70 0.7  CALCIUM  9.4 9.5  --    Recent Labs    03/21/23 0000 04/10/23 1732 06/09/23 0000  AST 16 28 18   ALT 12 19 14   ALKPHOS 53 57 61  BILITOT  --  0.6  --   PROT  --  6.5   --   ALBUMIN 3.7 3.7 3.8   Recent Labs    03/21/23 0000 04/10/23 1732 06/09/23 0000  WBC 6.8 9.1 8.0  NEUTROABS 3,454.00 5.6 5,048.00  HGB 11.1* 11.9* 11.8*  HCT 35* 36.7 37  MCV  --  81.6  --   PLT 240 280 263    Lab Results  Component Value Date   HGBA1C 5.6 04/26/2022   Lab Results  Component Value Date   CHOL 123 03/21/2023   HDL 45 03/21/2023   LDLCALC 53 03/21/2023   LDLDIRECT 114 (H) 04/26/2022   TRIG 176 (A) 03/21/2023   CHOLHDL 4.4 04/26/2022   Assessment & Plan Chronic diastolic CHF (congestive heart failure) (HCC) Euvolemic. No signs of acute CHF. Echo 04-2022 showed Left ventricular ejection fraction, by estimation, is 70 to 75%      CVA, old, cognitive deficits Chronic. Stable. On ASA 81 mg daily and lipitor 40 mg qday.     Gastroesophageal reflux disease without esophagitis Stable. Continue protonix  40 mg daily.     Moderate episode of recurrent major depressive disorder (HCC) Stable. Continue zoloft  100 mg daily. Now off ativan .     Anemia of chronic disease Last HgB 11.8 on 06-09-2023. Stable.     Constipation, unspecified constipation type Continue with senekot 2 tabs qhsn.      Anxiety Now off ativan  as recommended by hospitalist 12-2021.SABRA Continue with zoloft  100 mg qday     Essential hypertension Monitor BP. Currently on labetalol  200 mg bid. May need adjustment to meds on next routine visit if BP are consistently elevated beyond 140/90.     Hyperlipidemia, unspecified hyperlipidemia type Stable. On lipitor 40 mg daily.     DNR (do not resuscitate)         Camellia Door, DO [facility] [phone]

## 2023-12-19 NOTE — Assessment & Plan Note (Addendum)
 Euvolemic. No signs of acute CHF. Echo 04-2022 showed Left ventricular ejection fraction, by estimation, is 70 to 75%

## 2023-12-19 NOTE — Assessment & Plan Note (Addendum)
Stable. Continue protonix 40 mg daily 

## 2023-12-19 NOTE — Assessment & Plan Note (Addendum)
 Continue with senekot 2 tabs qhsn.

## 2023-12-19 NOTE — Assessment & Plan Note (Addendum)
 Now off ativan  as recommended by hospitalist 12-2021.SABRA Continue with zoloft  100 mg qday

## 2023-12-19 NOTE — Assessment & Plan Note (Addendum)
 SABRA

## 2023-12-19 NOTE — Assessment & Plan Note (Addendum)
 Last HgB 11.8 on 06-09-2023. Stable.

## 2023-12-19 NOTE — Assessment & Plan Note (Addendum)
 Chronic. Stable. On ASA 81 mg daily and lipitor 40 mg qday.

## 2023-12-19 NOTE — Assessment & Plan Note (Addendum)
 Stable. Continue zoloft  100 mg daily. Now off ativan .

## 2024-01-17 ENCOUNTER — Encounter: Payer: Self-pay | Admitting: Nurse Practitioner

## 2024-01-17 ENCOUNTER — Non-Acute Institutional Stay (SKILLED_NURSING_FACILITY): Payer: Self-pay | Admitting: Nurse Practitioner

## 2024-01-17 DIAGNOSIS — D638 Anemia in other chronic diseases classified elsewhere: Secondary | ICD-10-CM

## 2024-01-17 DIAGNOSIS — F419 Anxiety disorder, unspecified: Secondary | ICD-10-CM

## 2024-01-17 DIAGNOSIS — K5900 Constipation, unspecified: Secondary | ICD-10-CM

## 2024-01-17 DIAGNOSIS — K219 Gastro-esophageal reflux disease without esophagitis: Secondary | ICD-10-CM

## 2024-01-17 DIAGNOSIS — E785 Hyperlipidemia, unspecified: Secondary | ICD-10-CM

## 2024-01-17 DIAGNOSIS — I5032 Chronic diastolic (congestive) heart failure: Secondary | ICD-10-CM | POA: Diagnosis not present

## 2024-01-17 DIAGNOSIS — I69319 Unspecified symptoms and signs involving cognitive functions following cerebral infarction: Secondary | ICD-10-CM

## 2024-01-17 DIAGNOSIS — I10 Essential (primary) hypertension: Secondary | ICD-10-CM

## 2024-01-17 DIAGNOSIS — F331 Major depressive disorder, recurrent, moderate: Secondary | ICD-10-CM | POA: Diagnosis not present

## 2024-01-17 NOTE — Assessment & Plan Note (Signed)
 Euvolemic. No signs of acute CHF. Echo 04-2022 showed Left ventricular ejection fraction, by estimation, is 70 to 75%

## 2024-01-17 NOTE — Assessment & Plan Note (Signed)
Stable. On lipitor 40 mg daily.

## 2024-01-17 NOTE — Assessment & Plan Note (Signed)
 Controlled,  Continue protonix  40 mg daily.

## 2024-01-17 NOTE — Assessment & Plan Note (Signed)
Stable. Continue zoloft 100mg daily.  

## 2024-01-17 NOTE — Assessment & Plan Note (Signed)
 Chronic. Stable. On ASA 81 mg daily and lipitor 40 mg qday.

## 2024-01-17 NOTE — Assessment & Plan Note (Signed)
 Controlled on senokot 2 tablets at bedtime.

## 2024-01-17 NOTE — Progress Notes (Signed)
 Location:  Other [facility]) Nursing Home Room Number: [id number] Place of Service:  SNF (31)  Sherry [name] K. Caro, NP   Patient Care Team: [name], DO as PCP - General (Internal Medicine) [name], MD as Consulting Physician (Cardiology)  Extended Emergency Contact Information Primary Emergency Contact: Steinkamp,Sherry Sr. Home Phone: [phone] Mobile Phone: [phone] Relation: Spouse Secondary Emergency Contact: Sherry [name] Home Phone: [phone] Mobile Phone: [phone] Relation: Son  Goals of care: Advanced Directive information    10/26/2023    2:52 PM  Advanced Directives  Does Patient Have a Medical Advance Directive? Yes  Type of Advance Directive Out of facility DNR (pink MOST or yellow form)  Does patient want to make changes to medical advance directive? No - Patient declined     Chief Complaint  Patient presents with   Medical Management of Chronic Issues    Routine visit    HPI:  Pt is a 89 y.o. female seen today for medical management of chronic disease. Pt with hx of dementia, htn, CHF, CVA, hyperlipidemia, depression. She is a long term resident at [facility].  She has been doing well in the last month.  No signs of worsening anxiety or depression- she is on zoloft  100 mg daily.  She has hx of CVA and on lipitor 40 mg daily with asa She denies pain- continues on tylenol  PRN Staff reports she has been doing well.  Weight has remained stable  No wounds or skin issues     Past Medical History:  Diagnosis Date   Actinic keratosis    Breast cancer (HCC)    C1 cervical fracture (HCC)    Hypertension    Left-sided cerebrovascular accident (CVA) (HCC) 04/25/2022   Osteoarthritis    Peptic ulcer 05/03/2022   Thrombocytosis 05/15/2021   Past Surgical History:  Procedure Laterality Date   BACK SURGERY     BIOPSY  05/03/2022   Procedure: BIOPSY;  Surgeon: Leigh Elspeth SQUIBB, MD;  Location: [facility];   Service: Gastroenterology;;   ESOPHAGOGASTRODUODENOSCOPY (EGD) WITH PROPOFOL  N/A 05/03/2022   Procedure: ESOPHAGOGASTRODUODENOSCOPY (EGD) WITH PROPOFOL ;  Surgeon: Leigh Elspeth SQUIBB, MD;  Location: [facility] ENDOS[facility];  Service: Gastroenterology;  Laterality: N/A;   HEMOSTASIS CONTROL  05/03/2022   Procedure: HEMOSTASIS CONTROL;  Surgeon: Leigh Elspeth SQUIBB, MD;  Location: [facility] ENDOSCOPY;  Service: Gastroenterology;;   IR INJECT/THERA/INC NEEDLE/CATH/PLC EPI/LUMB/SAC [facility]  04/29/2021   MOHS SURGERY      Allergies  Allergen Reactions   Morphine     Other reaction(s): Other (See Comments) Apnea with large dose Respiratory Distress    Buprenorphine Hcl     Other reaction(s): Other (See Comments), Other (See Comments) Respiratory Distress Respiratory Distress    Codeine    Epinephrine      Other reaction(s): Other (See Comments) Severe anxiety; makes my heart race - would like to avoid during surgery   Morphine And Codeine Other (See Comments)    Respiratory Distress    Percocet [Oxycodone -Acetaminophen ]     Outpatient Encounter Medications as of 01/17/2024  Medication Sig   acetaminophen  (TYLENOL ) 500 MG tablet Take 1,000 mg by mouth every 8 (eight) hours as needed.   amLODipine  (NORVASC ) 5 MG tablet Take 1 tablet (5 mg total) by mouth daily.   aspirin  EC 81 MG tablet Take 1 tablet (81 mg total) by mouth daily. Swallow whole.   atorvastatin  (LIPITOR) 40 MG tablet Take 1 tablet (40 mg total) by mouth daily.   cholecalciferol  (VITAMIN D ) 25  MCG (1000 UNIT) tablet Take 2,000 Units by mouth daily.   Cyanocobalamin  (B-12) 1000 MCG CAPS Take 2,000 mcg by mouth daily.   fluticasone (FLONASE) 50 MCG/ACT nasal spray Place 2 sprays into both nostrils daily.   labetalol  (NORMODYNE ) 200 MG tablet Take 200 mg by mouth 2 (two) times daily.   lactose free nutrition (BOOST) LIQD Take 237 mLs by mouth 3 (three) times daily between meals.   loperamide  (IMODIUM  A-D) 2 MG tablet Take 2 mg by mouth every 6  (six) hours as needed for diarrhea or loose stools.   ondansetron  (ZOFRAN ) 8 MG tablet Take 8 mg by mouth every 8 (eight) hours as needed for nausea or vomiting.   pantoprazole  (PROTONIX ) 40 MG tablet Take 40 mg by mouth daily.   senna (SENOKOT) 8.6 MG TABS tablet Take 2 tablets by mouth at bedtime.   sertraline  (ZOLOFT ) 100 MG tablet Take 100 mg by mouth at bedtime.   tamsulosin  (FLOMAX ) 0.4 MG CAPS capsule Take 0.4 mg by mouth daily after supper.   Zinc  Oxide (TRIPLE PASTE) 12.8 % ointment Apply 1 Application topically as needed for irritation. (Patient not taking: Reported on 01/17/2024)   No facility-administered encounter medications on file as of 01/17/2024.    Review of Systems  Unable to perform ROS: Dementia     Immunization History  Administered Date(s) Administered   INFLUENZA, HIGH DOSE SEASONAL PF 12/16/2020, 12/13/2023   Influenza Inj Mdck Quad Pf 11/30/2021   Influenza-Unspecified 12/06/2011, 11/27/2012, 01/19/2014, 12/11/2014, 12/03/2015, 12/10/2016, 12/18/2019, 12/22/2022   PFIZER Comirnaty(Gray Top)Covid-19 Tri-Sucrose Vaccine 03/13/2019, 04/03/2019, 12/05/2019   Pneumococcal Conjugate-13 12/31/2016, 01/05/2017   Pneumococcal Polysaccharide-23 01/05/2018   Td (Adult),5 Lf Tetanus Toxid, Preservative Free 07/16/2019   Tdap 06/18/2011   Unspecified SARS-COV-2 Vaccination 03/12/2019, 04/02/2019, 12/05/2019   Zoster Recombinant(Shingrix) 06/30/2016, 10/26/2017, 01/06/2018   Pertinent  Health Maintenance Due  Topic Date Due   Mammogram  Never done   Influenza Vaccine  Completed   DEXA SCAN  Completed      01/09/2022    9:00 AM 01/09/2022    8:25 PM 01/10/2022    8:50 AM 08/16/2022   10:39 AM 03/01/2023   11:44 AM  Fall Risk  Falls in the past year?    1 1  Was there an injury with Fall?    1 1  Fall Risk Category Calculator    3 3  (RETIRED) Patient Fall Risk Level High fall risk  High fall risk  High fall risk     Patient at Risk for Falls Due to    History  of fall(s);Impaired balance/gait;Impaired mobility History of fall(s);Impaired balance/gait;Impaired mobility  Fall risk Follow up    Falls evaluation completed Education provided;Falls prevention discussed     Data saved with a previous flowsheet row definition   Functional Status Survey:    Vitals:   01/17/24 1401  BP: 123/70  Pulse: 70  Weight: 140 lb 12.8 oz (63.9 kg)  Height: 5' 2 (1.575 m)   Body mass index is 25.75 kg/m. Physical Exam Constitutional:      General: She is not in acute distress.    Appearance: She is well-developed. She is not diaphoretic.  HENT:     Head: Normocephalic and atraumatic.     Mouth/Throat:     Pharynx: No oropharyngeal exudate.  Eyes:     Conjunctiva/sclera: Conjunctivae normal.     Pupils: Pupils are equal, round, and reactive to light.  Cardiovascular:     Rate and Rhythm: Normal  rate and regular rhythm.     Heart sounds: Normal heart sounds.  Pulmonary:     Effort: Pulmonary effort is normal.     Breath sounds: Normal breath sounds.  Abdominal:     General: Bowel sounds are normal.     Palpations: Abdomen is soft.  Musculoskeletal:     Cervical back: Normal range of motion and neck supple.     Right lower leg: No edema.     Left lower leg: No edema.  Skin:    General: Skin is warm and dry.  Neurological:     Mental Status: She is alert.  Psychiatric:        Mood and Affect: Mood normal.     Labs reviewed: Recent Labs    03/21/23 0000 04/10/23 1732 06/09/23 0000  NA 142 137 140  K 4.4 4.0 4.6  CL 103 99 103  CO2 31* 25 27*  GLUCOSE  --  100*  --   BUN 19 28* 20  CREATININE 0.7 0.70 0.7  CALCIUM  9.4 9.5  --    Recent Labs    03/21/23 0000 04/10/23 1732 06/09/23 0000  AST 16 28 18   ALT 12 19 14   ALKPHOS 53 57 61  BILITOT  --  0.6  --   PROT  --  6.5  --   ALBUMIN 3.7 3.7 3.8   Recent Labs    03/21/23 0000 04/10/23 1732 06/09/23 0000  WBC 6.8 9.1 8.0  NEUTROABS 3,454.00 5.6 5,048.00  HGB 11.1*  11.9* 11.8*  HCT 35* 36.7 37  MCV  --  81.6  --   PLT 240 280 263   No results found for: TSH Lab Results  Component Value Date   HGBA1C 5.6 04/26/2022   Lab Results  Component Value Date   CHOL 123 03/21/2023   HDL 45 03/21/2023   LDLCALC 53 03/21/2023   LDLDIRECT 114 (H) 04/26/2022   TRIG 176 (A) 03/21/2023   CHOLHDL 4.4 04/26/2022    Significant Diagnostic Results in last 30 days:  No results found.  Assessment/Plan Moderate episode of recurrent major depressive disorder (HCC) Stable. Continue zoloft  100 mg daily.   Hyperlipidemia Stable. On lipitor 40 mg daily.  GERD (gastroesophageal reflux disease) Controlled,  Continue protonix  40 mg daily.   Essential hypertension Blood pressure has been well controlled on labetalol  and norvasc . Will continue current regimen and monitor   CVA, old, cognitive deficits Chronic. Stable. On ASA 81 mg daily and lipitor 40 mg qday.   Constipation Controlled on senokot 2 tablets at bedtime.   Chronic diastolic CHF (congestive heart failure) (HCC) Euvolemic.  No signs of acute CHF. Echo 04-2022 showed Left ventricular ejection fraction, by estimation, is 70 to 75%    Anemia of chronic disease Follow up cbc Stable on last lab     Shonn Farruggia K. Caro BODILY [facility] [phone]

## 2024-01-17 NOTE — Assessment & Plan Note (Signed)
 Follow up cbc Stable on last lab

## 2024-01-17 NOTE — Assessment & Plan Note (Addendum)
 Blood pressure has been well controlled on labetalol  and norvasc . Will continue current regimen and monitor

## 2024-01-19 LAB — COMPREHENSIVE METABOLIC PANEL WITH GFR
Albumin: 3.8 (ref 3.5–5.0)
Calcium: 9.3 (ref 8.7–10.7)
Globulin: 1.6
eGFR: 82

## 2024-01-19 LAB — HEPATIC FUNCTION PANEL
ALT: 14 U/L (ref 7–35)
AST: 19 (ref 13–35)
Alkaline Phosphatase: 60 (ref 25–125)
Bilirubin, Total: 0.4

## 2024-01-19 LAB — LIPID PANEL
Cholesterol: 110 (ref 0–200)
HDL: 43 (ref 35–70)
LDL Cholesterol: 44
Triglycerides: 145 (ref 40–160)

## 2024-01-19 LAB — BASIC METABOLIC PANEL WITH GFR
BUN: 17 (ref 4–21)
CO2: 27 — AB (ref 13–22)
Chloride: 103 (ref 99–108)
Creatinine: 0.7 (ref 0.5–1.1)
Glucose: 76
Potassium: 4.1 meq/L (ref 3.5–5.1)
Sodium: 141 (ref 137–147)

## 2024-01-19 LAB — CBC AND DIFFERENTIAL
HCT: 36 (ref 36–46)
Hemoglobin: 11.5 — AB (ref 12.0–16.0)
Platelets: 253 K/uL (ref 150–400)
WBC: 9.4

## 2024-01-19 LAB — CBC: RBC: 4.25 (ref 3.87–5.11)

## 2024-02-09 ENCOUNTER — Non-Acute Institutional Stay (SKILLED_NURSING_FACILITY): Admitting: Nurse Practitioner

## 2024-02-09 ENCOUNTER — Encounter: Payer: Self-pay | Admitting: Nurse Practitioner

## 2024-02-09 DIAGNOSIS — E785 Hyperlipidemia, unspecified: Secondary | ICD-10-CM

## 2024-02-09 DIAGNOSIS — I69319 Unspecified symptoms and signs involving cognitive functions following cerebral infarction: Secondary | ICD-10-CM | POA: Diagnosis not present

## 2024-02-09 DIAGNOSIS — K5900 Constipation, unspecified: Secondary | ICD-10-CM

## 2024-02-09 DIAGNOSIS — D638 Anemia in other chronic diseases classified elsewhere: Secondary | ICD-10-CM

## 2024-02-09 DIAGNOSIS — F331 Major depressive disorder, recurrent, moderate: Secondary | ICD-10-CM

## 2024-02-09 DIAGNOSIS — I5032 Chronic diastolic (congestive) heart failure: Secondary | ICD-10-CM | POA: Diagnosis not present

## 2024-02-09 DIAGNOSIS — K219 Gastro-esophageal reflux disease without esophagitis: Secondary | ICD-10-CM | POA: Diagnosis not present

## 2024-02-09 DIAGNOSIS — I10 Essential (primary) hypertension: Secondary | ICD-10-CM

## 2024-02-09 NOTE — Assessment & Plan Note (Signed)
 Doing very well at this time, will attempt GDR and decrease zoloft  to 75 mg daily

## 2024-02-09 NOTE — Assessment & Plan Note (Signed)
 LDL at goal. On lipitor 40 mg daily Lipids yearly

## 2024-02-09 NOTE — Assessment & Plan Note (Signed)
 Euvolemic.

## 2024-02-09 NOTE — Assessment & Plan Note (Signed)
 Controlled,  will attempt GDR to protonix  20 mg daily.

## 2024-02-09 NOTE — Progress Notes (Signed)
 Location:  Other [facility].  Nursing Home Room Number: [facility] [id number] Place of Service:  SNF [phone]) Harlene An, NP  PCP: [name], DO  Patient Care Team: [name], DO as PCP - General (Internal Medicine) [name], MD as Consulting Physician (Cardiology)  Extended Emergency Contact Information Primary Emergency Contact: Terpstra,james Sr. Home Phone: [phone] Mobile Phone: [phone] Relation: Spouse Secondary Emergency Contact: VICCI MICKEY AGENT Home Phone: [phone] Mobile Phone: [phone] Relation: Son  Goals of care: Advanced Directive information    02/09/2024    1:50 PM  Advanced Directives  Does Patient Have a Medical Advance Directive? Yes  Type of Advance Directive Out of facility DNR (pink MOST or yellow form)  Does patient want to make changes to medical advance directive? No - Patient declined     Chief Complaint  Patient presents with   Medical Management of Chronic Issues    Medical Management of Chronic Issues.     HPI:  Sherry [name] is a 89 y.o. female seen today for medical management of chronic disease.  Sherry [name] has been doing well over the last month. She has hx of dementia, anxiety, chf, protein calorie malnutrition, depression, htn, hyperlipidemia, CHF.  She is pleasantly confused during the visit.  States she has no pain.  Staff reports she is eating well- weight has trended up.  She has no skin issues reported.  Blood pressures well controlled.  No trouble taking mediation Overall very happy without signs of depression or anxiety.     Past Medical History:  Diagnosis Date   Actinic keratosis    Breast cancer (HCC)    C1 cervical fracture (HCC)    Hypertension    Left-sided cerebrovascular accident (CVA) (HCC) 04/25/2022   Osteoarthritis    Peptic ulcer 05/03/2022   Thrombocytosis 05/15/2021   Past Surgical History:  Procedure Laterality Date   BACK SURGERY     BIOPSY  05/03/2022   Procedure: BIOPSY;  Surgeon:  Leigh Elspeth SQUIBB, MD;  Location: [facility]OS[facility];  Service: Gastroenterology;;   ESOPHAGOGASTRODUODENOSCOPY (EGD) WITH PROPOFOL  N/A 05/03/2022   Procedure: ESOPHAGOGASTRODUODENOSCOPY (EGD) WITH PROPOFOL ;  Surgeon: Leigh Elspeth SQUIBB, MD;  Location: [facility] ENDOSCOPY;  Service: Gastroenterology;  Laterality: N/A;   HEMOSTASIS CONTROL  05/03/2022   Procedure: HEMOSTASIS CONTROL;  Surgeon: Leigh Elspeth SQUIBB, MD;  Location: [facility] ENDOSCOPY;  Service: Gastroenterology;;   IR INJECT/THERA/INC NEEDLE/CATH/PLC EPI/LUMB/SAC [facility]  04/29/2021   MOHS SURGERY      Allergies[1]  Outpatient Encounter Medications as of 02/09/2024  Medication Sig   acetaminophen  (TYLENOL ) 500 MG tablet Take 1,000 mg by mouth every 8 (eight) hours as needed.   amLODipine  (NORVASC ) 5 MG tablet Take 1 tablet (5 mg total) by mouth daily.   aspirin  EC 81 MG tablet Take 1 tablet (81 mg total) by mouth daily. Swallow whole.   atorvastatin  (LIPITOR) 40 MG tablet Take 1 tablet (40 mg total) by mouth daily.   cholecalciferol  (VITAMIN D ) 25 MCG (1000 UNIT) tablet Take 2,000 Units by mouth daily.   Cyanocobalamin  (B-12) 1000 MCG CAPS Take 1,000 mcg by mouth daily.   labetalol  (NORMODYNE ) 200 MG tablet Take 200 mg by mouth 2 (two) times daily.   lactose free nutrition (BOOST) LIQD Take 237 mLs by mouth 3 (three) times daily between meals.   pantoprazole  (PROTONIX ) 20 MG tablet Take 20 mg by mouth daily.   senna (SENOKOT) 8.6 MG TABS tablet Take 2 tablets by mouth at bedtime.   sertraline  (ZOLOFT ) 25 MG tablet Take 25 mg  by mouth daily.   sertraline  (ZOLOFT ) 50 MG tablet Take 50 mg by mouth daily.   Sodium Fluoride (PREVIDENT 5000 BOOSTER PLUS) 1.1 % PSTE Place 1 strip onto teeth at bedtime.   tamsulosin  (FLOMAX ) 0.4 MG CAPS capsule Take 0.4 mg by mouth daily after supper. (Patient not taking: Reported on 02/09/2024)   No facility-administered encounter medications on file as of 02/09/2024.    Review of Systems  Unable to perform ROS:  Dementia     Immunization History  Administered Date(s) Administered   INFLUENZA, HIGH DOSE SEASONAL PF 12/16/2020, 12/13/2023   Influenza Inj Mdck Quad Pf 11/30/2021   Influenza-Unspecified 12/06/2011, 11/27/2012, 01/19/2014, 12/11/2014, 12/03/2015, 12/10/2016, 12/18/2019, 12/22/2022   PFIZER Comirnaty(Gray Top)Covid-19 Tri-Sucrose Vaccine 03/13/2019, 04/03/2019, 12/05/2019   Pneumococcal Conjugate-13 12/31/2016, 01/05/2017   Pneumococcal Polysaccharide-23 01/05/2018   Td (Adult),5 Lf Tetanus Toxid, Preservative Free 07/16/2019   Tdap 06/18/2011   Unspecified SARS-COV-2 Vaccination 03/12/2019, 04/02/2019, 12/05/2019   Zoster Recombinant(Shingrix) 06/30/2016, 10/26/2017, 01/06/2018   Pertinent  Health Maintenance Due  Topic Date Due   Mammogram  Never done   Influenza Vaccine  Completed   Bone Density Scan  Completed      01/09/2022    9:00 AM 01/09/2022    8:25 PM 01/10/2022    8:50 AM 08/16/2022   10:39 AM 03/01/2023   11:44 AM  Fall Risk  Falls in the past year?    1 1  Was there an injury with Fall?    1  1   Fall Risk Category Calculator    3 3  (RETIRED) Patient Fall Risk Level High fall risk  High fall risk  High fall risk     Patient at Risk for Falls Due to    History of fall(s);Impaired balance/gait;Impaired mobility History of fall(s);Impaired balance/gait;Impaired mobility  Fall risk Follow up    Falls evaluation completed Education provided;Falls prevention discussed     Data saved with a previous flowsheet row definition   Functional Status Survey:    Vitals:   02/09/24 1341  BP: 128/73  Pulse: 62  Resp: 16  Temp: 98.9 F (37.2 C)  SpO2: 97%  Weight: 141 lb 6.4 oz (64.1 kg)  Height: 5' 2 (1.575 m)   Body mass index is 25.86 kg/m.  Wt Readings from Last 3 Encounters:  02/09/24 141 lb 6.4 oz (64.1 kg)  01/17/24 140 lb 12.8 oz (63.9 kg)  10/26/23 139 lb 9.6 oz (63.3 kg)    Physical Exam Constitutional:      General: She is not in acute  distress.    Appearance: She is well-developed. She is not diaphoretic.  HENT:     Head: Normocephalic and atraumatic.     Mouth/Throat:     Pharynx: No oropharyngeal exudate.  Eyes:     Conjunctiva/sclera: Conjunctivae normal.     Pupils: Pupils are equal, round, and reactive to light.  Cardiovascular:     Rate and Rhythm: Normal rate and regular rhythm.     Heart sounds: Normal heart sounds.  Pulmonary:     Effort: Pulmonary effort is normal.     Breath sounds: Normal breath sounds.  Abdominal:     General: Bowel sounds are normal.     Palpations: Abdomen is soft.  Musculoskeletal:     Cervical back: Normal range of motion and neck supple.     Right lower leg: No edema.     Left lower leg: No edema.  Skin:    General: Skin is warm  and dry.  Neurological:     Mental Status: She is alert. She is disoriented.     Motor: Weakness present.     Gait: Gait abnormal.  Psychiatric:        Mood and Affect: Mood normal.    Labs reviewed: Recent Labs    03/21/23 0000 04/10/23 1732 06/09/23 0000 01/19/24 0000  NA 142 137 140 141  K 4.4 4.0 4.6 4.1  CL 103 99 103 103  CO2 31* 25 27* 27*  GLUCOSE  --  100*  --   --   BUN 19 28* 20 17  CREATININE 0.7 0.70 0.7 0.7  CALCIUM  9.4 9.5  --  9.3   Recent Labs    04/10/23 1732 06/09/23 0000 01/19/24 0000  AST 28 18 19   ALT 19 14 14   ALKPHOS 57 61 60  BILITOT 0.6  --   --   PROT 6.5  --   --   ALBUMIN 3.7 3.8 3.8   Recent Labs    03/21/23 0000 04/10/23 1732 06/09/23 0000 01/19/24 0000  WBC 6.8 9.1 8.0 9.4  NEUTROABS 3,454.00 5.6 5,048.00  --   HGB 11.1* 11.9* 11.8* 11.5*  HCT 35* 36.7 37 36  MCV  --  81.6  --   --   PLT 240 280 263 253   No results found for: TSH Lab Results  Component Value Date   HGBA1C 5.6 04/26/2022   Lab Results  Component Value Date   CHOL 110 01/19/2024   HDL 43 01/19/2024   LDLCALC 44 01/19/2024   LDLDIRECT 114 (H) 04/26/2022   TRIG 145 01/19/2024   CHOLHDL 4.4 04/26/2022     Significant Diagnostic Results in last 30 days:  No results found.  Assessment/Plan Moderate episode of recurrent major depressive disorder (HCC) Doing very well at this time, will attempt GDR and decrease zoloft  to 75 mg daily   Hyperlipidemia LDL at goal. On lipitor 40 mg daily Lipids yearly   GERD (gastroesophageal reflux disease) Controlled,  will attempt GDR to protonix  20 mg daily.   Essential hypertension Blood pressure has been well controlled on labetalol  and norvasc . Will continue current regimen and monitor   CVA, old, cognitive deficits Chronic. Stable. On ASA 81 mg daily and lipitor 40 mg qday.   Constipation Some diarrhea noted, decrease to senna 1 tablet at bedtime.   Chronic diastolic CHF (congestive heart failure) (HCC) Euvolemic.     Anemia of chronic disease Hgb stable.      Odette Watanabe K. Caro BODILY [facility] [phone]      [1]  Allergies Allergen Reactions   Morphine     Other reaction(s): Other (See Comments) Apnea with large dose Respiratory Distress    Buprenorphine Hcl     Other reaction(s): Other (See Comments), Other (See Comments) Respiratory Distress Respiratory Distress    Codeine    Epinephrine      Other reaction(s): Other (See Comments) Severe anxiety; makes my heart race - would like to avoid during surgery   Morphine And Codeine Other (See Comments)    Respiratory Distress    Percocet [Oxycodone -Acetaminophen ]

## 2024-02-09 NOTE — Assessment & Plan Note (Signed)
 Some diarrhea noted, decrease to senna 1 tablet at bedtime.

## 2024-02-09 NOTE — Assessment & Plan Note (Signed)
 Chronic. Stable. On ASA 81 mg daily and lipitor 40 mg qday.

## 2024-02-09 NOTE — Assessment & Plan Note (Signed)
 Blood pressure has been well controlled on labetalol  and norvasc . Will continue current regimen and monitor

## 2024-02-09 NOTE — Assessment & Plan Note (Signed)
 Hgb stable.

## 2024-02-13 LAB — VITAMIN B12: Vitamin B-12: 1669

## 2024-03-02 ENCOUNTER — Non-Acute Institutional Stay (SKILLED_NURSING_FACILITY): Payer: Self-pay | Admitting: Orthopedic Surgery

## 2024-03-02 ENCOUNTER — Encounter: Payer: Self-pay | Admitting: Orthopedic Surgery

## 2024-03-02 DIAGNOSIS — E440 Moderate protein-calorie malnutrition: Secondary | ICD-10-CM | POA: Diagnosis not present

## 2024-03-02 DIAGNOSIS — R21 Rash and other nonspecific skin eruption: Secondary | ICD-10-CM | POA: Diagnosis not present

## 2024-03-02 DIAGNOSIS — I10 Essential (primary) hypertension: Secondary | ICD-10-CM

## 2024-03-02 DIAGNOSIS — I5032 Chronic diastolic (congestive) heart failure: Secondary | ICD-10-CM | POA: Diagnosis not present

## 2024-03-02 MED ORDER — TRIAMCINOLONE ACETONIDE 0.1 % EX CREA: 1.0000 | TOPICAL_CREAM | Freq: Two times a day (BID) | CUTANEOUS | Status: AC

## 2024-03-02 NOTE — Progress Notes (Signed)
 " Location:  Other [place].  Nursing Home Room Number: [facility] [id number] Place of Service:  SNF [phone]) Provider:  Greig Cluster, NP  PCP: [name], DO  Patient Care Team: [name], DO as PCP - General (Internal Medicine) [name], MD as Consulting Physician (Cardiology)  Extended Emergency Contact Information Primary Emergency Contact: Fackler,james Sr. Home Phone: [phone] Mobile Phone: [phone] Relation: Spouse Secondary Emergency Contact: VICCI MICKEY AGENT Home Phone: [phone] Mobile Phone: [phone] Relation: Son  Code Status:  DNR Goals of care: Advanced Directive information    02/09/2024    1:50 PM  Advanced Directives  Does Patient Have a Medical Advance Directive? Yes  Type of Advance Directive Out of facility DNR (pink MOST or yellow form)  Does patient want to make changes to medical advance directive? No - Patient declined     Chief Complaint  Patient presents with   Rash    Rash    HPI:  Pt is a 89 y.o. female seen today for acute visit due to rash.   She currently resides on the skilled nursing unit at [facility]. PMH: CHF, HTN, HLD, h/o CVA, GERD, anemia, spinal stenosis, depression and anxiety.   She reports increased itching to chest, between and under breasts. No changes in soap or lotions. Nursing trimmed nails to reduce scratch marks. She does have a few areas os skin breakdown from itching. Afebrile. Vitals stable.   LVEF 70-75%, grade I DD. Weight stable. Not on diuretics.   Remains on amlodipine  and labetalol  for HTN.   Remains on Boost for increased calories.   No recent falls or injuries.    Past Medical History:  Diagnosis Date   Actinic keratosis    Breast cancer (HCC)    C1 cervical fracture (HCC)    Hypertension    Left-sided cerebrovascular accident (CVA) (HCC) 04/25/2022   Osteoarthritis    Peptic ulcer 05/03/2022   Thrombocytosis 05/15/2021   Past Surgical History:  Procedure Laterality Date    BACK SURGERY     BIOPSY  05/03/2022   Procedure: BIOPSY;  Surgeon: Leigh Elspeth SQUIBB, MD;  Location: [facility] ENDOSCOPY;  Service: Gastroenterology;;   ESOPHAGOGASTRODUODENOSCOPY (EGD) WITH PROPOFOL  N/A 05/03/2022   Procedure: ESOPHAGOGASTRODUODENOSCOPY (EGD) WITH PROPOFOL ;  Surgeon: Leigh Elspeth SQUIBB, MD;  Location: [facility] ENDOSCOPY;  Service: Gastroenterology;  Laterality: N/A;   HEMOSTASIS CONTROL  05/03/2022   Procedure: HEMOSTASIS CONTROL;  Surgeon: Leigh Elspeth SQUIBB, MD;  Location: [facility] ENDOSCOPY;  Service: Gastroenterology;;   IR INJECT/THERA/INC NEEDLE/CATH/PLC EPI/LUMB/SAC [facility]  04/29/2021   MOHS SURGERY      Allergies[1]  Outpatient Encounter Medications as of 03/02/2024  Medication Sig   acetaminophen  (TYLENOL ) 500 MG tablet Take 1,000 mg by mouth every 8 (eight) hours as needed.   amLODipine  (NORVASC ) 5 MG tablet Take 1 tablet (5 mg total) by mouth daily.   aspirin  EC 81 MG tablet Take 1 tablet (81 mg total) by mouth daily. Swallow whole.   atorvastatin  (LIPITOR) 40 MG tablet Take 1 tablet (40 mg total) by mouth daily.   cholecalciferol  (VITAMIN D ) 25 MCG (1000 UNIT) tablet Take 2,000 Units by mouth daily.   Cyanocobalamin  (B-12) 1000 MCG CAPS Take 1,000 mcg by mouth daily.   labetalol  (NORMODYNE ) 200 MG tablet Take 200 mg by mouth 2 (two) times daily.   lactose free nutrition (BOOST) LIQD Take 237 mLs by mouth 3 (three) times daily between meals.   pantoprazole  (PROTONIX ) 20 MG tablet Take 20 mg by mouth daily.  senna (SENOKOT) 8.6 MG TABS tablet Take 1 tablet by mouth at bedtime.   sertraline  (ZOLOFT ) 25 MG tablet Take 25 mg by mouth daily.   sertraline  (ZOLOFT ) 50 MG tablet Take 50 mg by mouth daily.   Sodium Fluoride (PREVIDENT 5000 BOOSTER PLUS) 1.1 % PSTE Place 1 strip onto teeth at bedtime.   tamsulosin  (FLOMAX ) 0.4 MG CAPS capsule Take 0.4 mg by mouth daily after supper. (Patient not taking: Reported on 03/02/2024)   No facility-administered encounter medications on file as of  03/02/2024.    Review of Systems  Constitutional:  Negative for fatigue and fever.  HENT:  Negative for sore throat and trouble swallowing.   Eyes:  Negative for visual disturbance.  Respiratory:  Negative for cough and shortness of breath.   Cardiovascular:  Negative for chest pain and leg swelling.  Gastrointestinal:  Negative for abdominal distention and abdominal pain.  Genitourinary:  Negative for dysuria and hematuria.  Musculoskeletal:  Positive for gait problem.  Skin:  Positive for rash.  Neurological:  Negative for dizziness and headaches.  Psychiatric/Behavioral:  Positive for dysphoric mood. The patient is not nervous/anxious.     Immunization History  Administered Date(s) Administered   INFLUENZA, HIGH DOSE SEASONAL PF 12/16/2020, 12/13/2023   Influenza Inj Mdck Quad Pf 11/30/2021   Influenza-Unspecified 12/06/2011, 11/27/2012, 01/19/2014, 12/11/2014, 12/03/2015, 12/10/2016, 12/18/2019, 12/22/2022   PFIZER Comirnaty(Gray Top)Covid-19 Tri-Sucrose Vaccine 03/13/2019, 04/03/2019, 12/05/2019   Pneumococcal Conjugate-13 12/31/2016, 01/05/2017   Pneumococcal Polysaccharide-23 01/05/2018   Td (Adult),5 Lf Tetanus Toxid, Preservative Free 07/16/2019   Tdap 06/18/2011   Unspecified SARS-COV-2 Vaccination 03/12/2019, 04/02/2019, 12/05/2019   Zoster Recombinant(Shingrix) 06/30/2016, 10/26/2017, 01/06/2018   Pertinent  Health Maintenance Due  Topic Date Due   Mammogram  Never done   Influenza Vaccine  Completed   Bone Density Scan  Completed      01/09/2022    9:00 AM 01/09/2022    8:25 PM 01/10/2022    8:50 AM 08/16/2022   10:39 AM 03/01/2023   11:44 AM  Fall Risk  Falls in the past year?    1 1  Was there an injury with Fall?    1  1   Fall Risk Category Calculator    3 3  (RETIRED) Patient Fall Risk Level High fall risk  High fall risk  High fall risk     Patient at Risk for Falls Due to    History of fall(s);Impaired balance/gait;Impaired mobility History of  fall(s);Impaired balance/gait;Impaired mobility  Fall risk Follow up    Falls evaluation completed Education provided;Falls prevention discussed     Data saved with a previous flowsheet row definition   Functional Status Survey:    Vitals:   03/02/24 0916  BP: 105/61  Pulse: 80  Resp: 18  Temp: (!) 97.5 F (36.4 C)  SpO2: 93%  Weight: 141 lb 6.4 oz (64.1 kg)  Height: 5' 2 (1.575 m)   Body mass index is 25.86 kg/m. Physical Exam Vitals reviewed.  Constitutional:      General: She is not in acute distress. HENT:     Head: Normocephalic.  Eyes:     General:        Right eye: No discharge.        Left eye: No discharge.  Cardiovascular:     Rate and Rhythm: Normal rate and regular rhythm.     Pulses: Normal pulses.     Heart sounds: Normal heart sounds.  Pulmonary:     Effort: Pulmonary effort  is normal.     Breath sounds: Normal breath sounds.  Abdominal:     General: Bowel sounds are normal. There is no distension.     Palpations: Abdomen is soft.     Tenderness: There is no abdominal tenderness.  Musculoskeletal:     Cervical back: Neck supple.     Right lower leg: No edema.     Left lower leg: No edema.  Skin:    General: Skin is warm.     Capillary Refill: Capillary refill takes less than 2 seconds.     Comments: Scratch marks to chest, between breasts, no blisters, small area of maculopapular rash between breasts  Neurological:     General: No focal deficit present.     Mental Status: She is alert.     Gait: Gait abnormal.  Psychiatric:        Mood and Affect: Mood normal.     Labs reviewed: Recent Labs    03/21/23 0000 04/10/23 1732 06/09/23 0000 01/19/24 0000  NA 142 137 140 141  K 4.4 4.0 4.6 4.1  CL 103 99 103 103  CO2 31* 25 27* 27*  GLUCOSE  --  100*  --   --   BUN 19 28* 20 17  CREATININE 0.7 0.70 0.7 0.7  CALCIUM  9.4 9.5  --  9.3   Recent Labs    04/10/23 1732 06/09/23 0000 01/19/24 0000  AST 28 18 19   ALT 19 14 14   ALKPHOS  57 61 60  BILITOT 0.6  --   --   PROT 6.5  --   --   ALBUMIN 3.7 3.8 3.8   Recent Labs    03/21/23 0000 04/10/23 1732 06/09/23 0000 01/19/24 0000  WBC 6.8 9.1 8.0 9.4  NEUTROABS 3,454.00 5.6 5,048.00  --   HGB 11.1* 11.9* 11.8* 11.5*  HCT 35* 36.7 37 36  MCV  --  81.6  --   --   PLT 240 280 263 253   No results found for: TSH Lab Results  Component Value Date   HGBA1C 5.6 04/26/2022   Lab Results  Component Value Date   CHOL 110 01/19/2024   HDL 43 01/19/2024   LDLCALC 44 01/19/2024   LDLDIRECT 114 (H) 04/26/2022   TRIG 145 01/19/2024   CHOLHDL 4.4 04/26/2022    Significant Diagnostic Results in last 30 days:  No results found.  Assessment/Plan 1. Rash (Primary) - triamcinolone cream (KENALOG) 0.1 %; Apply 1 Application topically 2 (two) times daily for 7 days.  2. Chronic diastolic CHF (congestive heart failure) (HCC) - compensated  - not on medication - cont monthly weights  3. Essential hypertension - controlled with amlodipine  and labetalol   4. Protein-calorie malnutrition, moderate - BMI 25.86 - albumin 3.8 12/2023 - followed by dietary - cont Boost    Family/ staff Communication: plan discussed with patient and nurse  Labs/tests ordered:  none        [1]  Allergies Allergen Reactions   Morphine     Other reaction(s): Other (See Comments) Apnea with large dose Respiratory Distress    Buprenorphine Hcl     Other reaction(s): Other (See Comments), Other (See Comments) Respiratory Distress Respiratory Distress    Codeine    Epinephrine      Other reaction(s): Other (See Comments) Severe anxiety; makes my heart race - would like to avoid during surgery   Morphine And Codeine Other (See Comments)    Respiratory Distress    Percocet [Oxycodone -Acetaminophen ]    "

## 2024-03-08 ENCOUNTER — Encounter: Payer: Self-pay | Admitting: Nurse Practitioner

## 2024-03-08 ENCOUNTER — Non-Acute Institutional Stay (SKILLED_NURSING_FACILITY): Admitting: Nurse Practitioner

## 2024-03-08 DIAGNOSIS — E440 Moderate protein-calorie malnutrition: Secondary | ICD-10-CM

## 2024-03-08 DIAGNOSIS — K5900 Constipation, unspecified: Secondary | ICD-10-CM

## 2024-03-08 DIAGNOSIS — F331 Major depressive disorder, recurrent, moderate: Secondary | ICD-10-CM | POA: Diagnosis not present

## 2024-03-08 DIAGNOSIS — I10 Essential (primary) hypertension: Secondary | ICD-10-CM

## 2024-03-08 DIAGNOSIS — K219 Gastro-esophageal reflux disease without esophagitis: Secondary | ICD-10-CM

## 2024-03-08 DIAGNOSIS — D638 Anemia in other chronic diseases classified elsewhere: Secondary | ICD-10-CM

## 2024-03-08 DIAGNOSIS — I5032 Chronic diastolic (congestive) heart failure: Secondary | ICD-10-CM | POA: Diagnosis not present

## 2024-03-08 DIAGNOSIS — I69319 Unspecified symptoms and signs involving cognitive functions following cerebral infarction: Secondary | ICD-10-CM | POA: Diagnosis not present

## 2024-03-08 DIAGNOSIS — E785 Hyperlipidemia, unspecified: Secondary | ICD-10-CM | POA: Diagnosis not present

## 2024-03-08 NOTE — Assessment & Plan Note (Signed)
 Blood pressure well controlled, goal bp <140/90 Continue current medications and dietary modifications follow metabolic panel

## 2024-03-08 NOTE — Assessment & Plan Note (Signed)
"  Euvolemic  "

## 2024-03-08 NOTE — Assessment & Plan Note (Addendum)
 Controlled, no compliant of worsening GERD symptoms Continues on protonix  20 mg daily

## 2024-03-08 NOTE — Assessment & Plan Note (Signed)
 She has had weight loss in the last month Continues on supplement Expect to worse with progression of dementia.

## 2024-03-08 NOTE — Progress Notes (Signed)
 " Location:  Other [place].  Nursing Home Room Number: [facility] [id number] Place of Service:  SNF [phone]) Harlene An, NP  PCP: [name], DO  Patient Care Team: [name], DO as PCP - General (Internal Medicine) [name], MD as Consulting Physician (Cardiology)  Extended Emergency Contact Information Primary Emergency Contact: Vowell,james Sr. Home Phone: [phone] Mobile Phone: [phone] Relation: Spouse Secondary Emergency Contact: VICCI MICKEY AGENT Home Phone: [phone] Mobile Phone: [phone] Relation: Son  Goals of care: Advanced Directive information    02/09/2024    1:50 PM  Advanced Directives  Does Patient Have a Medical Advance Directive? Yes  Type of Advance Directive Out of facility DNR (pink MOST or yellow form)  Does patient want to make changes to medical advance directive? No - Patient declined     Chief Complaint  Patient presents with   Medical Management of Chronic Issues    Medical Management of Chronic Issues.     HPI:  Pt is a 89 y.o. female seen today for medical management of chronic disease.  Pt with dementia, CHF, anemia, CVA, GERD, anxiety/depression, hyperlipidemia.  Staff reports she is doing well. She has lost weight in the last month.  She likes to stay in the bed most of the time. She denies anxiety or depression.  Reports no pain.  States she has no shortness of breath or chest pains/discomfort.  No cough or congestion Denies abdominal pain or discomfort.  Staff reports no concerns today.    Past Medical History:  Diagnosis Date   Actinic keratosis    Breast cancer (HCC)    C1 cervical fracture (HCC)    Hypertension    Left-sided cerebrovascular accident (CVA) (HCC) 04/25/2022   Osteoarthritis    Peptic ulcer 05/03/2022   Thrombocytosis 05/15/2021   Past Surgical History:  Procedure Laterality Date   BACK SURGERY     BIOPSY  05/03/2022   Procedure: BIOPSY;  Surgeon: Leigh Elspeth SQUIBB, MD;   Location: [facility] ENDOSCOPY;  Service: Gastroenterology;;   ESOPHAGOGASTRODUODENOSCOPY (EGD) WITH PROPOFOL  N/A 05/03/2022   Procedure: ESOPHAGOGASTRODUODENOSCOPY (EGD) WITH PROPOFOL ;  Surgeon: Leigh Elspeth SQUIBB, MD;  Location: [facility] ENDOSCOPY;  Service: Gastroenterology;  Laterality: N/A;   HEMOSTASIS CONTROL  05/03/2022   Procedure: HEMOSTASIS CONTROL;  Surgeon: Leigh Elspeth SQUIBB, MD;  Location: [facility] ENDOSCOPY;  Service: Gastroenterology;;   IR INJECT/THERA/INC NEEDLE/CATH/PLC EPI/LUMB/SAC [facility]  04/29/2021   MOHS SURGERY      Allergies[1]  Outpatient Encounter Medications as of 03/08/2024  Medication Sig   acetaminophen  (TYLENOL ) 500 MG tablet Take 1,000 mg by mouth every 8 (eight) hours as needed.   amLODipine  (NORVASC ) 5 MG tablet Take 1 tablet (5 mg total) by mouth daily.   aspirin  EC 81 MG tablet Take 1 tablet (81 mg total) by mouth daily. Swallow whole.   atorvastatin  (LIPITOR) 40 MG tablet Take 1 tablet (40 mg total) by mouth daily.   cholecalciferol  (VITAMIN D ) 25 MCG (1000 UNIT) tablet Take 2,000 Units by mouth daily.   Cyanocobalamin  (B-12) 1000 MCG CAPS Take 1,000 mcg by mouth daily.   labetalol  (NORMODYNE ) 200 MG tablet Take 200 mg by mouth 2 (two) times daily.   lactose free nutrition (BOOST) LIQD Take 237 mLs by mouth 3 (three) times daily between meals.   pantoprazole  (PROTONIX ) 20 MG tablet Take 20 mg by mouth daily.   polyethylene glycol (MIRALAX  / GLYCOLAX ) 17 g packet Take 17 g by mouth daily as needed.   senna (SENOKOT) 8.6 MG TABS tablet Take  1 tablet by mouth at bedtime.   sertraline  (ZOLOFT ) 25 MG tablet Take 25 mg by mouth daily.   sertraline  (ZOLOFT ) 50 MG tablet Take 50 mg by mouth daily.   Sodium Fluoride (PREVIDENT 5000 BOOSTER PLUS) 1.1 % PSTE Place 1 strip onto teeth at bedtime.   triamcinolone  cream (KENALOG ) 0.1 % Apply 1 Application topically 2 (two) times daily for 7 days.   No facility-administered encounter medications on file as of 03/08/2024.    Review of  Systems  Constitutional:  Negative for activity change, appetite change, fatigue and unexpected weight change.  HENT:  Negative for congestion and hearing loss.   Eyes: Negative.   Respiratory:  Negative for cough and shortness of breath.   Cardiovascular:  Negative for chest pain, palpitations and leg swelling.  Gastrointestinal:  Negative for abdominal pain, constipation and diarrhea.  Genitourinary:  Negative for difficulty urinating and dysuria.  Musculoskeletal:  Negative for arthralgias and myalgias.  Skin:  Negative for color change and wound.  Neurological:  Negative for dizziness and weakness.  Psychiatric/Behavioral:  Positive for confusion. Negative for agitation and behavioral problems.        Pleasently confused, answers all questions.      Immunization History  Administered Date(s) Administered   INFLUENZA, HIGH DOSE SEASONAL PF 12/16/2020, 12/13/2023   Influenza Inj Mdck Quad Pf 11/30/2021   Influenza-Unspecified 12/06/2011, 11/27/2012, 01/19/2014, 12/11/2014, 12/03/2015, 12/10/2016, 12/18/2019, 12/22/2022   PFIZER Comirnaty(Gray Top)Covid-19 Tri-Sucrose Vaccine 03/13/2019, 04/03/2019, 12/05/2019   Pneumococcal Conjugate-13 12/31/2016, 01/05/2017   Pneumococcal Polysaccharide-23 01/05/2018   Td (Adult),5 Lf Tetanus Toxid, Preservative Free 07/16/2019   Tdap 06/18/2011   Unspecified SARS-COV-2 Vaccination 03/12/2019, 04/02/2019, 12/05/2019, 12/23/2023   Zoster Recombinant(Shingrix) 06/30/2016, 10/26/2017, 01/06/2018   Pertinent  Health Maintenance Due  Topic Date Due   Mammogram  Never done   Influenza Vaccine  Completed   Bone Density Scan  Completed      01/09/2022    8:25 PM 01/10/2022    8:50 AM 08/16/2022   10:39 AM 03/01/2023   11:44 AM 03/02/2024    1:05 PM  Fall Risk  Falls in the past year?   1 1 1   Was there an injury with Fall?   1  1  0  Fall Risk Category Calculator   3 3 1   (RETIRED) Patient Fall Risk Level High fall risk  High fall risk       Patient at Risk for Falls Due to   History of fall(s);Impaired balance/gait;Impaired mobility History of fall(s);Impaired balance/gait;Impaired mobility Impaired balance/gait  Fall risk Follow up   Falls evaluation completed Education provided;Falls prevention discussed Falls evaluation completed     Data saved with a previous flowsheet row definition   Functional Status Survey:    Vitals:   03/08/24 1104  BP: (!) 150/84  Pulse: 62  Resp: 17  Temp: 98.2 F (36.8 C)  SpO2: 96%  Weight: 136 lb 3.2 oz (61.8 kg)  Height: 5' 2 (1.575 m)   Body mass index is 24.91 kg/m. Physical Exam Constitutional:      General: She is not in acute distress.    Appearance: She is well-developed. She is not diaphoretic.  HENT:     Head: Normocephalic and atraumatic.     Mouth/Throat:     Pharynx: No oropharyngeal exudate.  Eyes:     Conjunctiva/sclera: Conjunctivae normal.     Pupils: Pupils are equal, round, and reactive to light.  Cardiovascular:     Rate and Rhythm: Normal rate  and regular rhythm.     Heart sounds: Normal heart sounds.  Pulmonary:     Effort: Pulmonary effort is normal.     Breath sounds: Normal breath sounds.  Abdominal:     General: Bowel sounds are normal.     Palpations: Abdomen is soft.  Musculoskeletal:     Cervical back: Normal range of motion and neck supple.     Right lower leg: No edema.     Left lower leg: No edema.  Skin:    General: Skin is warm and dry.  Neurological:     Mental Status: She is alert. She is disoriented.     Motor: Weakness present.  Psychiatric:        Mood and Affect: Mood normal.     Labs reviewed: Recent Labs    03/21/23 0000 04/10/23 1732 06/09/23 0000 01/19/24 0000  NA 142 137 140 141  K 4.4 4.0 4.6 4.1  CL 103 99 103 103  CO2 31* 25 27* 27*  GLUCOSE  --  100*  --   --   BUN 19 28* 20 17  CREATININE 0.7 0.70 0.7 0.7  CALCIUM  9.4 9.5  --  9.3   Recent Labs    04/10/23 1732 06/09/23 0000 01/19/24 0000  AST  28 18 19   ALT 19 14 14   ALKPHOS 57 61 60  BILITOT 0.6  --   --   PROT 6.5  --   --   ALBUMIN 3.7 3.8 3.8   Recent Labs    03/21/23 0000 04/10/23 1732 06/09/23 0000 01/19/24 0000  WBC 6.8 9.1 8.0 9.4  NEUTROABS 3,454.00 5.6 5,048.00  --   HGB 11.1* 11.9* 11.8* 11.5*  HCT 35* 36.7 37 36  MCV  --  81.6  --   --   PLT 240 280 263 253   No results found for: TSH Lab Results  Component Value Date   HGBA1C 5.6 04/26/2022   Lab Results  Component Value Date   CHOL 110 01/19/2024   HDL 43 01/19/2024   LDLCALC 44 01/19/2024   LDLDIRECT 114 (H) 04/26/2022   TRIG 145 01/19/2024   CHOLHDL 4.4 04/26/2022    Significant Diagnostic Results in last 30 days:  No results found.  Assessment/Plan Protein-calorie malnutrition, moderate She has had weight loss in the last month Continues on supplement Expect to worse with progression of dementia.   Moderate episode of recurrent major depressive disorder (HCC) Doing well at this time. Has tolerated the zoloft  GDR from last month.  Will attempt reduction again next month.   Hyperlipidemia LDL at goal. On lipitor 40 mg daily Lipids yearly   GERD (gastroesophageal reflux disease) Controlled, no compliant of worsening GERD symptoms Continues on protonix  20 mg daily    Essential hypertension Blood pressure well controlled, goal bp <140/90 Continue current medications and dietary modifications follow metabolic panel  CVA, old, cognitive deficits Chronic. Stable. On ASA 81 mg daily and lipitor 40 mg qday.   Constipation Stable at this time.   Chronic diastolic CHF (congestive heart failure) (HCC) Euvolemic.     Anemia of chronic disease Hgb stable.      Henslee Lottman K. Caro BODILY [facility] [phone]       [1]  Allergies Allergen Reactions   Morphine     Other reaction(s): Other (See Comments) Apnea with large dose Respiratory Distress    Buprenorphine Hcl     Other  reaction(s): Other (See Comments), Other (See  Comments) Respiratory Distress Respiratory Distress    Codeine    Epinephrine      Other reaction(s): Other (See Comments) Severe anxiety; makes my heart race - would like to avoid during surgery   Morphine And Codeine Other (See Comments)    Respiratory Distress    Percocet [Oxycodone -Acetaminophen ]    "

## 2024-03-08 NOTE — Assessment & Plan Note (Signed)
 Hgb stable

## 2024-03-08 NOTE — Assessment & Plan Note (Signed)
 LDL at goal. On lipitor 40 mg daily Lipids yearly

## 2024-03-08 NOTE — Assessment & Plan Note (Signed)
 Stable at this time

## 2024-03-08 NOTE — Assessment & Plan Note (Signed)
 Chronic. Stable. On ASA 81 mg daily and lipitor 40 mg qday.

## 2024-03-08 NOTE — Assessment & Plan Note (Signed)
 Doing well at this time. Has tolerated the zoloft  GDR from last month.  Will attempt reduction again next month.

## 2024-03-23 ENCOUNTER — Encounter: Payer: Self-pay | Admitting: Orthopedic Surgery

## 2024-03-23 ENCOUNTER — Non-Acute Institutional Stay (SKILLED_NURSING_FACILITY): Payer: Self-pay | Admitting: Orthopedic Surgery

## 2024-03-23 DIAGNOSIS — Z0000 Encounter for general adult medical examination without abnormal findings: Secondary | ICD-10-CM | POA: Diagnosis not present

## 2024-03-23 NOTE — Progress Notes (Signed)
 "  Chief Complaint  Patient presents with   Medicare Wellness    AWV     Subjective:   Sherry [name] is a 89 y.o. female who presents for a Medicare Annual Wellness Visit.  Visit info / Clinical Intake: Medicare Wellness Visit Type:: Subsequent Annual Wellness Visit Persons participating in visit and providing information:: caregiver (with patient present) Medicare Wellness Visit Mode:: In-person (required for WTM) Interpreter Needed?: No Pre-visit prep was completed: no AWV questionnaire completed by patient prior to visit?: no Living arrangements:: in nursing facility Patient's Overall Health Status Rating: good Typical amount of pain: some Does pain affect daily life?: (!) yes Are you currently prescribed opioids?: no  Dietary Habits and Nutritional Risks How many meals a day?: 3 Eats fruit and vegetables daily?: yes Most meals are obtained by: having others provide food In the last 2 weeks, have you had any of the following?: none Diabetic:: no  Functional Status Activities of Daily Living (to include ambulation/medication): (!) Dependent Feeding: Independent Dressing/Grooming: Dependent Bathing: Dependent Toileting: Dependent Transfer: Dependent Ambulation: Dependent Medication Administration: Dependent Is this a change from baseline?: Pre-admission baseline Home Management (perform basic housework or laundry): Dependent Manage your own finances?: (!) no Primary transportation is: facility / other Concerns about vision?: no *vision screening is required for WTM* Concerns about hearing?: no  Fall Screening Falls in the past year?: 1 Number of falls in past year: 0 Was there an injury with Fall?: 0 Fall Risk Category Calculator: 1 Patient Fall Risk Level: Low Fall Risk  Fall Risk Patient at Risk for Falls Due to: Impaired balance/gait Fall risk Follow up: Falls evaluation completed  Home and Transportation Safety: All rugs have non-skid backing?: N/A,  no rugs All stairs or steps have railings?: N/A, no stairs Grab bars in the bathtub or shower?: yes Regular seat belt use?: yes Hospital stays in the last year:: (!) yes How many hospital stays:: 1  Cognitive Assessment Difficulty concentrating, remembering, or making decisions? : yes Will 6CIT or Mini Cog be Completed: no 6CIT or Mini Cog Declined: patient has a diagnosis of dementia or cognitive impairment  Advance Directives (For Healthcare) Does Patient Have a Medical Advance Directive?: Yes Does patient want to make changes to medical advance directive?: No - Patient declined Type of Advance Directive: Out of facility DNR (pink MOST or yellow form) Out of facility DNR (pink MOST or yellow form) in Chart? (Ambulatory ONLY): Yes - validated most recent copy scanned in chart  Reviewed/Updated  Reviewed/Updated: Reviewed All (Medical, Surgical, Family, Medications, Allergies, Care Teams, Patient Goals)    Allergies (verified) Morphine, Buprenorphine hcl, Codeine, Epinephrine , Morphine and codeine, and Percocet [oxycodone -acetaminophen ]   Current Medications (verified) Outpatient Encounter Medications as of 03/23/2024  Medication Sig   acetaminophen  (TYLENOL ) 500 MG tablet Take 1,000 mg by mouth every 8 (eight) hours as needed.   amLODipine  (NORVASC ) 5 MG tablet Take 1 tablet (5 mg total) by mouth daily.   aspirin  EC 81 MG tablet Take 1 tablet (81 mg total) by mouth daily. Swallow whole.   atorvastatin  (LIPITOR) 40 MG tablet Take 1 tablet (40 mg total) by mouth daily.   cholecalciferol  (VITAMIN D ) 25 MCG (1000 UNIT) tablet Take 2,000 Units by mouth daily.   Cyanocobalamin  (B-12) 1000 MCG CAPS Take 1,000 mcg by mouth daily.   labetalol  (NORMODYNE ) 200 MG tablet Take 200 mg by mouth 2 (two) times daily.   lactose free nutrition (BOOST) LIQD Take 237 mLs by mouth 3 (three) times  daily between meals.   pantoprazole  (PROTONIX ) 20 MG tablet Take 20 mg by mouth daily.   polyethylene  glycol (MIRALAX  / GLYCOLAX ) 17 g packet Take 17 g by mouth daily as needed.   senna (SENOKOT) 8.6 MG TABS tablet Take 1 tablet by mouth at bedtime.   sertraline  (ZOLOFT ) 25 MG tablet Take 25 mg by mouth daily.   sertraline  (ZOLOFT ) 50 MG tablet Take 50 mg by mouth daily.   Sodium Fluoride (PREVIDENT 5000 BOOSTER PLUS) 1.1 % PSTE Place 1 strip onto teeth at bedtime.   triamcinolone  cream (KENALOG ) 0.1 % Apply 1 Application topically 3 (three) times daily as needed.   No facility-administered encounter medications on file as of 03/23/2024.    History: Past Medical History:  Diagnosis Date   Actinic keratosis    Breast cancer (HCC)    C1 cervical fracture (HCC)    Hypertension    Left-sided cerebrovascular accident (CVA) (HCC) 04/25/2022   Osteoarthritis    Peptic ulcer 05/03/2022   Thrombocytosis 05/15/2021   Past Surgical History:  Procedure Laterality Date   BACK SURGERY     BIOPSY  05/03/2022   Procedure: BIOPSY;  Surgeon: Leigh Elspeth SQUIBB, MD;  Location: [facility] ENDOSCOPY;  Service: Gastroenterology;;   ESOPHAGOGASTRODUODENOSCOPY (EGD) WITH PROPOFOL  N/A 05/03/2022   Procedure: ESOPHAGOGASTRODUODENOSCOPY (EGD) WITH PROPOFOL ;  Surgeon: Leigh Elspeth SQUIBB, MD;  Location: [facility] ENDOSCOPY;  Service: Gastroenterology;  Laterality: N/A;   HEMOSTASIS CONTROL  05/03/2022   Procedure: HEMOSTASIS CONTROL;  Surgeon: Leigh Elspeth SQUIBB, MD;  Location: [facility] ENDOSCOPY;  Service: Gastroenterology;;   IR INJECT/THERA/INC NEEDLE/CATH/PLC EPI/LUMB/SAC [facility]  04/29/2021   MOHS SURGERY     Family History  Problem Relation Age of Onset   Hypertension Father    Social History   Occupational History   Not on file  Tobacco Use   Smoking status: Never    Passive exposure: Never   Smokeless tobacco: Never  Vaping Use   Vaping status: Never Used  Substance and Sexual Activity   Alcohol use: Yes    Comment: Occassionally   Drug use: No   Sexual activity: Not on file   Tobacco Counseling Counseling  given: Not Answered  SDOH Screenings   Food Insecurity: No Food Insecurity (05/15/2022)  Housing: Low Risk (05/15/2022)  Transportation Needs: No Transportation Needs (05/15/2022)  Utilities: Not At Risk (05/15/2022)  Depression (PHQ2-9): Low Risk (03/23/2024)  Tobacco Use: Low Risk (03/23/2024)   See flowsheets for full screening details  Depression Screen PHQ 2 & 9 Depression Scale- Over the past 2 weeks, how often have you been bothered by any of the following problems? Little interest or pleasure in doing things: 0 Feeling down, depressed, or hopeless (PHQ Adolescent also includes...irritable): 0 PHQ-2 Total Score: 0     Goals Addressed   None          Objective:    Today's Vitals   03/23/24 0915  BP: 121/68  Pulse: 76  Resp: 17  Temp: 97.7 F (36.5 C)  SpO2: 95%  Weight: 139 lb 6.4 oz (63.2 kg)  Height: 5' 2 (1.575 m)   Body mass index is 25.5 kg/m.  Hearing/Vision screen No results found. Immunizations and Health Maintenance Health Maintenance  Topic Date Due   Mammogram  Never done   COVID-19 Vaccine (8 - Mixed Product risk 2025-26 season) 06/22/2024   Medicare Annual Wellness (AWV)  03/23/2025   DTaP/Tdap/Td (3 - Td or Tdap) 07/15/2029   Pneumococcal Vaccine: 50+ Years  Completed  Influenza Vaccine  Completed   Bone Density Scan  Completed   Zoster Vaccines- Shingrix  Completed   Meningococcal B Vaccine  Aged Out        Assessment/Plan:  This is a routine wellness examination for Samira.  Patient Care Team: [name], DO as PCP - General (Internal Medicine) [name], MD as Consulting Physician (Cardiology)  I have personally reviewed and noted the following in the patients chart:   Medical and social history Use of alcohol, tobacco or illicit drugs  Current medications and supplements including opioid prescriptions. Functional ability and status Nutritional status Physical activity Advanced directives List of other  physicians Hospitalizations, surgeries, and ER visits in previous 12 months Vitals Screenings to include cognitive, depression, and falls Referrals and appointments  No orders of the defined types were placed in this encounter.  In addition, I have reviewed and discussed with patient certain preventive protocols, quality metrics, and best practice recommendations. A written personalized care plan for preventive services as well as general preventive health recommendations were provided to patient.   Greig FORBES Cluster, NP   03/23/2024   Return in 1 year (on 03/23/2025).  After Visit Summary: (In Person-Declined) Patient declined AVS at this time.  Nurse Notes: Patient lives in skilled nursing. Nurse assisted in answering questions. Dependent with ADLs except feeding. H/o dementia. H/o breast cancer. Do not recommend future mammograms due to advanced age and worsening dementia.  "

## 2024-03-23 NOTE — Patient Instructions (Signed)
 Sherry [name],  Thank you for taking the time for your Medicare Wellness Visit. I appreciate your continued commitment to your health goals. Please review the care plan we discussed, and feel free to reach out if I can assist you further.  Please note that Annual Wellness Visits do not include a physical exam. Some assessments may be limited, especially if the visit was conducted virtually. If needed, we may recommend an in-person follow-up with your provider.  Ongoing Care Seeing your primary care provider every 3 to 6 months helps us  monitor your health and provide consistent, personalized care.   Referrals If a referral was made during today's visit and you haven't received any updates within two weeks, please contact the referred provider directly to check on the status.  Recommended Screenings:  Health Maintenance  Topic Date Due   Breast Cancer Screening  Never done   COVID-19 Vaccine (8 - Mixed Product risk 2025-26 season) 06/22/2024   Medicare Annual Wellness Visit  03/23/2025   DTaP/Tdap/Td vaccine (3 - Td or Tdap) 07/15/2029   Pneumococcal Vaccine for age over 38  Completed   Flu Shot  Completed   Osteoporosis screening with Bone Density Scan  Completed   Zoster (Shingles) Vaccine  Completed   Meningitis B Vaccine  Aged Out       03/23/2024    1:31 PM  Advanced Directives  Does Patient Have a Medical Advance Directive? Yes  Type of Advance Directive Out of facility DNR (pink MOST or yellow form)    Vision: Annual vision screenings are recommended for early detection of glaucoma, cataracts, and diabetic retinopathy. These exams can also reveal signs of chronic conditions such as diabetes and high blood pressure.  Dental: Annual dental screenings help detect early signs of oral cancer, gum disease, and other conditions linked to overall health, including heart disease and diabetes.  Please see the attached documents for additional preventive care recommendations.
